# Patient Record
Sex: Female | Born: 1955
Health system: Southern US, Community
[De-identification: ages and names within clinical notes are randomized; demographics above are authoritative.]

## PROBLEM LIST (undated history)

## (undated) DIAGNOSIS — I10 Essential (primary) hypertension: Secondary | ICD-10-CM

## (undated) DIAGNOSIS — K644 Residual hemorrhoidal skin tags: Secondary | ICD-10-CM

## (undated) DIAGNOSIS — R7989 Other specified abnormal findings of blood chemistry: Secondary | ICD-10-CM

## (undated) DIAGNOSIS — R112 Nausea with vomiting, unspecified: Secondary | ICD-10-CM

## (undated) DIAGNOSIS — K921 Melena: Secondary | ICD-10-CM

## (undated) DIAGNOSIS — T8859XA Other complications of anesthesia, initial encounter: Secondary | ICD-10-CM

## (undated) DIAGNOSIS — T7840XA Allergy, unspecified, initial encounter: Secondary | ICD-10-CM

## (undated) DIAGNOSIS — R945 Abnormal results of liver function studies: Secondary | ICD-10-CM

## (undated) DIAGNOSIS — I6529 Occlusion and stenosis of unspecified carotid artery: Secondary | ICD-10-CM

## (undated) DIAGNOSIS — E785 Hyperlipidemia, unspecified: Secondary | ICD-10-CM

## (undated) DIAGNOSIS — Z9889 Other specified postprocedural states: Secondary | ICD-10-CM

## (undated) DIAGNOSIS — E119 Type 2 diabetes mellitus without complications: Secondary | ICD-10-CM

## (undated) HISTORY — DX: Hyperlipidemia, unspecified: E78.5

## (undated) HISTORY — DX: Abnormal results of liver function studies: R94.5

## (undated) HISTORY — DX: Residual hemorrhoidal skin tags: K64.4

## (undated) HISTORY — PX: POLYPECTOMY: SHX149

## (undated) HISTORY — DX: Other specified abnormal findings of blood chemistry: R79.89

## (undated) HISTORY — DX: Melena: K92.1

## (undated) HISTORY — PX: OTHER SURGICAL HISTORY: SHX169

## (undated) HISTORY — DX: Essential (primary) hypertension: I10

## (undated) HISTORY — PX: BREAST BIOPSY: SHX20

## (undated) HISTORY — DX: Occlusion and stenosis of unspecified carotid artery: I65.29

## (undated) HISTORY — PX: COLONOSCOPY: SHX174

## (undated) HISTORY — DX: Allergy, unspecified, initial encounter: T78.40XA

## (undated) HISTORY — DX: Type 2 diabetes mellitus without complications: E11.9

---

## 1998-05-27 ENCOUNTER — Ambulatory Visit (HOSPITAL_COMMUNITY): Admission: RE | Admit: 1998-05-27 | Discharge: 1998-05-27 | Payer: Self-pay | Admitting: Obstetrics & Gynecology

## 1998-05-29 ENCOUNTER — Ambulatory Visit (HOSPITAL_COMMUNITY): Admission: RE | Admit: 1998-05-29 | Discharge: 1998-05-29 | Payer: Self-pay | Admitting: Obstetrics & Gynecology

## 1998-11-26 ENCOUNTER — Ambulatory Visit (HOSPITAL_COMMUNITY): Admission: RE | Admit: 1998-11-26 | Discharge: 1998-11-26 | Payer: Self-pay | Admitting: Obstetrics & Gynecology

## 1998-11-26 ENCOUNTER — Encounter: Payer: Self-pay | Admitting: Obstetrics & Gynecology

## 1999-05-24 ENCOUNTER — Encounter: Payer: Self-pay | Admitting: Obstetrics & Gynecology

## 1999-05-24 ENCOUNTER — Ambulatory Visit (HOSPITAL_COMMUNITY): Admission: RE | Admit: 1999-05-24 | Discharge: 1999-05-24 | Payer: Self-pay | Admitting: Obstetrics & Gynecology

## 1999-08-05 ENCOUNTER — Other Ambulatory Visit: Admission: RE | Admit: 1999-08-05 | Discharge: 1999-08-05 | Payer: Self-pay | Admitting: Obstetrics & Gynecology

## 1999-09-07 ENCOUNTER — Encounter: Admission: RE | Admit: 1999-09-07 | Discharge: 1999-12-06 | Payer: Self-pay | Admitting: *Deleted

## 2000-05-26 ENCOUNTER — Encounter: Payer: Self-pay | Admitting: Obstetrics & Gynecology

## 2000-05-26 ENCOUNTER — Ambulatory Visit (HOSPITAL_COMMUNITY): Admission: RE | Admit: 2000-05-26 | Discharge: 2000-05-26 | Payer: Self-pay | Admitting: Obstetrics & Gynecology

## 2001-06-18 ENCOUNTER — Encounter: Payer: Self-pay | Admitting: Obstetrics & Gynecology

## 2001-06-18 ENCOUNTER — Ambulatory Visit (HOSPITAL_COMMUNITY): Admission: RE | Admit: 2001-06-18 | Discharge: 2001-06-18 | Payer: Self-pay | Admitting: Obstetrics & Gynecology

## 2002-06-18 ENCOUNTER — Ambulatory Visit (HOSPITAL_COMMUNITY): Admission: RE | Admit: 2002-06-18 | Discharge: 2002-06-18 | Payer: Self-pay | Admitting: Obstetrics & Gynecology

## 2002-06-18 ENCOUNTER — Other Ambulatory Visit: Admission: RE | Admit: 2002-06-18 | Discharge: 2002-06-18 | Payer: Self-pay | Admitting: Obstetrics & Gynecology

## 2002-06-18 ENCOUNTER — Encounter: Payer: Self-pay | Admitting: Obstetrics & Gynecology

## 2003-08-05 ENCOUNTER — Ambulatory Visit (HOSPITAL_COMMUNITY): Admission: RE | Admit: 2003-08-05 | Discharge: 2003-08-05 | Payer: Self-pay | Admitting: Obstetrics & Gynecology

## 2003-08-05 ENCOUNTER — Encounter: Payer: Self-pay | Admitting: Obstetrics & Gynecology

## 2003-08-21 ENCOUNTER — Other Ambulatory Visit: Admission: RE | Admit: 2003-08-21 | Discharge: 2003-08-21 | Payer: Self-pay | Admitting: Obstetrics & Gynecology

## 2004-09-27 ENCOUNTER — Ambulatory Visit (HOSPITAL_COMMUNITY): Admission: RE | Admit: 2004-09-27 | Discharge: 2004-09-27 | Payer: Self-pay | Admitting: Obstetrics & Gynecology

## 2004-09-27 ENCOUNTER — Other Ambulatory Visit: Admission: RE | Admit: 2004-09-27 | Discharge: 2004-09-27 | Payer: Self-pay | Admitting: Obstetrics & Gynecology

## 2005-12-30 ENCOUNTER — Ambulatory Visit: Payer: Self-pay | Admitting: Internal Medicine

## 2006-02-07 ENCOUNTER — Ambulatory Visit: Payer: Self-pay | Admitting: Internal Medicine

## 2006-02-14 ENCOUNTER — Encounter (INDEPENDENT_AMBULATORY_CARE_PROVIDER_SITE_OTHER): Payer: Self-pay | Admitting: Specialist

## 2006-02-14 ENCOUNTER — Ambulatory Visit: Payer: Self-pay | Admitting: Internal Medicine

## 2006-02-22 ENCOUNTER — Ambulatory Visit (HOSPITAL_COMMUNITY): Admission: RE | Admit: 2006-02-22 | Discharge: 2006-02-22 | Payer: Self-pay | Admitting: Internal Medicine

## 2007-03-28 ENCOUNTER — Ambulatory Visit (HOSPITAL_COMMUNITY): Admission: RE | Admit: 2007-03-28 | Discharge: 2007-03-28 | Payer: Self-pay | Admitting: Internal Medicine

## 2008-03-27 ENCOUNTER — Ambulatory Visit (HOSPITAL_COMMUNITY): Admission: RE | Admit: 2008-03-27 | Discharge: 2008-03-27 | Payer: Self-pay | Admitting: Internal Medicine

## 2009-04-08 ENCOUNTER — Ambulatory Visit (HOSPITAL_COMMUNITY): Admission: RE | Admit: 2009-04-08 | Discharge: 2009-04-08 | Payer: Self-pay | Admitting: Internal Medicine

## 2009-06-08 DIAGNOSIS — E1142 Type 2 diabetes mellitus with diabetic polyneuropathy: Secondary | ICD-10-CM | POA: Insufficient documentation

## 2009-06-08 DIAGNOSIS — E785 Hyperlipidemia, unspecified: Secondary | ICD-10-CM | POA: Insufficient documentation

## 2009-06-08 DIAGNOSIS — I10 Essential (primary) hypertension: Secondary | ICD-10-CM

## 2009-06-08 HISTORY — DX: Type 2 diabetes mellitus with diabetic polyneuropathy: E11.42

## 2009-08-13 DIAGNOSIS — I6529 Occlusion and stenosis of unspecified carotid artery: Secondary | ICD-10-CM | POA: Insufficient documentation

## 2009-08-13 HISTORY — DX: Occlusion and stenosis of unspecified carotid artery: I65.29

## 2010-04-12 ENCOUNTER — Ambulatory Visit (HOSPITAL_COMMUNITY): Admission: RE | Admit: 2010-04-12 | Discharge: 2010-04-12 | Payer: Self-pay | Admitting: Internal Medicine

## 2011-01-12 ENCOUNTER — Telehealth: Payer: Self-pay | Admitting: Internal Medicine

## 2011-03-08 ENCOUNTER — Ambulatory Visit: Payer: Self-pay | Admitting: Internal Medicine

## 2011-03-25 ENCOUNTER — Other Ambulatory Visit (HOSPITAL_COMMUNITY): Payer: Self-pay | Admitting: Internal Medicine

## 2011-03-25 DIAGNOSIS — Z1231 Encounter for screening mammogram for malignant neoplasm of breast: Secondary | ICD-10-CM

## 2011-04-18 ENCOUNTER — Ambulatory Visit (HOSPITAL_COMMUNITY)
Admission: RE | Admit: 2011-04-18 | Discharge: 2011-04-18 | Disposition: A | Discharge status: Home / Self Care | Payer: BC Managed Care – PPO | Source: Ambulatory Visit | Attending: Internal Medicine | Admitting: Internal Medicine

## 2011-04-18 DIAGNOSIS — Z1231 Encounter for screening mammogram for malignant neoplasm of breast: Secondary | ICD-10-CM

## 2011-04-25 NOTE — Telephone Encounter (Signed)
Summary: Triage  Phone Note From Other Clinic   Caller: [facility] @ [facility] [phone] Call For: Dr. Henrene Pastor Summary of Call: [place] hx w/Dr. Henrene Pastor.Marland KitchenMarland KitchenMarland KitchenDiarrhea and problems w/hemorroids. Requesting appt. w/P.A. Initial call taken by: Webb Laws,  January 12, 2011 3:41 PM  Follow-up for Phone Call        Spoke with Peter Congo at [facility] and requested records be faxed over for Dr. Henrene Pastor to review. No history with Henrene Pastor found. Follow-up by: Rosanne Sack RN,  January 13, 2011 10:30 AM

## 2012-04-02 ENCOUNTER — Other Ambulatory Visit (HOSPITAL_COMMUNITY): Payer: Self-pay | Admitting: Internal Medicine

## 2012-04-02 DIAGNOSIS — Z1231 Encounter for screening mammogram for malignant neoplasm of breast: Secondary | ICD-10-CM

## 2012-04-19 DIAGNOSIS — E669 Obesity, unspecified: Secondary | ICD-10-CM | POA: Insufficient documentation

## 2012-04-19 HISTORY — DX: Obesity, unspecified: E66.9

## 2012-04-23 ENCOUNTER — Ambulatory Visit (HOSPITAL_COMMUNITY): Payer: BC Managed Care – PPO

## 2012-05-16 ENCOUNTER — Ambulatory Visit (HOSPITAL_COMMUNITY)
Admission: RE | Admit: 2012-05-16 | Discharge: 2012-05-16 | Disposition: A | Discharge status: Home / Self Care | Payer: BC Managed Care – PPO | Source: Ambulatory Visit | Attending: Internal Medicine | Admitting: Internal Medicine

## 2012-05-16 DIAGNOSIS — Z1231 Encounter for screening mammogram for malignant neoplasm of breast: Secondary | ICD-10-CM | POA: Insufficient documentation

## 2012-06-06 ENCOUNTER — Other Ambulatory Visit: Payer: Self-pay | Admitting: Internal Medicine

## 2012-06-06 DIAGNOSIS — R1011 Right upper quadrant pain: Secondary | ICD-10-CM

## 2012-06-07 ENCOUNTER — Ambulatory Visit
Admission: RE | Admit: 2012-06-07 | Discharge: 2012-06-07 | Disposition: A | Discharge status: Home / Self Care | Payer: BC Managed Care – PPO | Source: Ambulatory Visit | Attending: Internal Medicine | Admitting: Internal Medicine

## 2012-06-07 DIAGNOSIS — R1011 Right upper quadrant pain: Secondary | ICD-10-CM

## 2012-06-08 ENCOUNTER — Other Ambulatory Visit: Payer: Self-pay | Admitting: Internal Medicine

## 2012-06-08 DIAGNOSIS — K769 Liver disease, unspecified: Secondary | ICD-10-CM

## 2012-06-13 ENCOUNTER — Ambulatory Visit
Admission: RE | Admit: 2012-06-13 | Discharge: 2012-06-13 | Disposition: A | Discharge status: Home / Self Care | Payer: BC Managed Care – PPO | Source: Ambulatory Visit | Attending: Internal Medicine | Admitting: Internal Medicine

## 2012-06-13 DIAGNOSIS — K769 Liver disease, unspecified: Secondary | ICD-10-CM

## 2012-06-13 MED ORDER — GADOBENATE DIMEGLUMINE 529 MG/ML IV SOLN
18.0000 mL | Freq: Once | INTRAVENOUS | Status: AC | PRN
  Administered 2012-06-13: 18 mL via INTRAVENOUS

## 2012-06-14 ENCOUNTER — Other Ambulatory Visit (HOSPITAL_COMMUNITY): Payer: Self-pay | Admitting: Internal Medicine

## 2012-06-14 DIAGNOSIS — R160 Hepatomegaly, not elsewhere classified: Secondary | ICD-10-CM

## 2012-06-15 ENCOUNTER — Other Ambulatory Visit: Payer: Self-pay | Admitting: Radiology

## 2012-06-18 ENCOUNTER — Encounter (HOSPITAL_COMMUNITY): Payer: Self-pay | Admitting: Pharmacy Technician

## 2012-06-19 ENCOUNTER — Encounter (HOSPITAL_COMMUNITY): Payer: Self-pay

## 2012-06-19 ENCOUNTER — Ambulatory Visit (HOSPITAL_COMMUNITY)
Admission: RE | Admit: 2012-06-19 | Discharge: 2012-06-19 | Disposition: A | Discharge status: Home / Self Care | Payer: BC Managed Care – PPO | Source: Ambulatory Visit | Attending: Internal Medicine | Admitting: Internal Medicine

## 2012-06-19 DIAGNOSIS — K7689 Other specified diseases of liver: Secondary | ICD-10-CM | POA: Insufficient documentation

## 2012-06-19 DIAGNOSIS — R7989 Other specified abnormal findings of blood chemistry: Secondary | ICD-10-CM | POA: Insufficient documentation

## 2012-06-19 DIAGNOSIS — R160 Hepatomegaly, not elsewhere classified: Secondary | ICD-10-CM

## 2012-06-19 DIAGNOSIS — I10 Essential (primary) hypertension: Secondary | ICD-10-CM | POA: Insufficient documentation

## 2012-06-19 LAB — GLUCOSE, CAPILLARY
Glucose-Capillary: 196 mg/dL — ABNORMAL HIGH (ref 70–99)
Glucose-Capillary: 164 mg/dL — ABNORMAL HIGH (ref 70–99)

## 2012-06-19 LAB — APTT: aPTT: 34 seconds (ref 24–37)

## 2012-06-19 LAB — CBC
MCV: 78.1 fL (ref 78.0–100.0)
Platelets: 235 10*3/uL (ref 150–400)
RBC: 5.25 MIL/uL — ABNORMAL HIGH (ref 3.87–5.11)
WBC: 13 10*3/uL — ABNORMAL HIGH (ref 4.0–10.5)

## 2012-06-19 LAB — PROTIME-INR
INR: 0.91 (ref 0.00–1.49)
Prothrombin Time: 12.5 seconds (ref 11.6–15.2)

## 2012-06-19 MED ORDER — SODIUM CHLORIDE 0.9 % IV SOLN
Freq: Once | INTRAVENOUS | Status: AC
  Administered 2012-06-19: 1000 mL via INTRAVENOUS

## 2012-06-19 MED ORDER — MIDAZOLAM HCL 5 MG/5ML IJ SOLN
INTRAMUSCULAR | Status: AC | PRN
  Administered 2012-06-19: 2 mg via INTRAVENOUS

## 2012-06-19 MED ORDER — HYDROCODONE-ACETAMINOPHEN 5-325 MG PO TABS: 1.0000 | ORAL_TABLET | ORAL | Status: DC | PRN

## 2012-06-19 MED ORDER — FENTANYL CITRATE 0.05 MG/ML IJ SOLN
INTRAMUSCULAR | Status: AC
  Filled 2012-06-19: qty 4

## 2012-06-19 MED ORDER — MIDAZOLAM HCL 2 MG/2ML IJ SOLN
INTRAMUSCULAR | Status: AC
  Filled 2012-06-19: qty 6

## 2012-06-19 MED ORDER — FENTANYL CITRATE 0.05 MG/ML IJ SOLN
INTRAMUSCULAR | Status: AC | PRN
  Administered 2012-06-19 (×2): 50 ug via INTRAVENOUS

## 2012-06-19 NOTE — Procedures (Signed)
US guided core biopsies of right hepatic lesion.  4 cores samples obtained.  No immediate complication.

## 2012-06-19 NOTE — ED Notes (Signed)
John from transportation here to take pt to [facility]

## 2012-06-19 NOTE — H&P (Signed)
Chief Complaint: "I'm here for liver biopsy" Referring Physician:Shaw HPI: Valerie Cline is an 56 y.o. female who had some RUQ discomfort concerning for gallbladder disease but incidentally found a liver lesion concerning for malignancy. She's had no prior history of cancer and no prior imaging documenting this lesion. She is referred for US guided biopsy of this lesion. PMHx reviewed, she otherwise feels ok.  Past Medical History:  Past Medical History  Diagnosis Date  . External hemorrhoids without mention of complication   . Blood in stool   . Hyperlipidemia   . Hypertension   . Carotid stenosis   . Elevated liver function tests     Past Surgical History: Colonoscopy, polypectomy---benign path  Family History: No family history on file.  Social History:  reports that she has never smoked. She does not have any smokeless tobacco history on file. She reports that she does not drink alcohol or use illicit drugs.  Allergies: No Known Allergies  Medications: amLODipine (NORVASC) 10 MG tablet (Taking) Sig - Route: Take 10 mg by mouth daily. - Oral Class: Historical Med Number of times this order has been changed since signing: 1 Order [address] aspirin 81 MG tablet (Taking) Sig - Route: Take 81 mg by mouth daily. - Oral Class: Historical Med Number of times this order has been changed since signing: 1 Order [address] atorvastatin (LIPITOR) 40 MG tablet (Taking) 05/24/2012 Sig - Route: Take 40 mg by mouth Daily. - Oral Class: Historical Med Number of times this order has been changed since signing: 1 [address] benazepril (LOTENSIN) 20 MG tablet (Taking) Sig - Route: Take 20 mg by mouth daily. - Oral Class: Historical Med Number of times this order has been changed since signing: 1 Order [address] fenofibrate 160 MG tablet (Taking) Sig - Route: Take 160 mg by mouth daily. - Oral Class: Historical Med Number of times this order has been changed since signing: 1 Order [address]  hydrochlorothiazide 25 MG tablet (Taking) Sig - Route: Take 25 mg by mouth daily. - Oral Class: Historical Med Number of times this order has been changed since signing: 1 Order [address] insulin regular human CONCENTRATED (HUMULIN R) 500 UNIT/ML SOLN injection (Taking) Sig - Route: Inject into the skin as directed. - Subcutaneous Class: Historical Med Number of times this order has been changed since signing: 1 Order [address] metFORMIN (GLUCOPHAGE) 1000 MG tablet (Taking) Sig - Route: Take 1,000 mg by mouth 2 (two) times daily with a meal. - Oral Class: Historical Med Number of times this order has been changed since signing: 1 Order [address] metoprolol succinate (TOPROL-XL) 25 MG 24 hr tablet (Taking) 05/23/2012 Sig - Route: Take 25 mg by mouth Daily. - Oral   Please HPI for pertinent positives, otherwise complete 10 system ROS negative.  Physical Exam: Blood pressure 177/81, pulse 86, temperature 98.9 F (37.2 C), temperature source Oral, resp. rate 18, height 5\' 7"  (1.702 m), weight 195 lb (88.451 kg), SpO2 93.00%. Body mass index is 30.54 kg/(m^2).   General Appearance:  Alert, cooperative, no distress, appears stated age  Head:  Normocephalic, without obvious abnormality, atraumatic  ENT: Unremarkable  Neck: Supple, symmetrical, trachea midline, no adenopathy, thyroid: not enlarged, symmetric, no tenderness/mass/nodules  Lungs:   Clear to auscultation bilaterally, no w/r/r, respirations unlabored without use of accessory muscles.  Heart:  Regular rate and rhythm, S1, S2 normal, no murmur, rub or gallop. Carotids 2+ without bruit.  Abdomen:   Soft, non-tender,  non distended. Bowel sounds active all four quadrants,  no masses, no organomegaly.  Neurologic: Normal affect, no gross deficits.   Results for orders placed during the hospital encounter of 06/19/12 (from the past 48 hour(s))  GLUCOSE, CAPILLARY     Status: Abnormal   Collection Time   06/19/12 12:46 PM      Component Value  Range Comment   Glucose-Capillary 196 (*) 70 - 99 mg/dL    No results found.  Assessment/PlanRight lobe Right lobe liver lesion. For US guide liver lesion biopsy today Discussed procedure with pt and spouse including risks and complications. Labs pending Consent signed in chart  Ascencion Dike PA-C 06/19/2012, 1:08 PM

## 2012-06-19 NOTE — ED Notes (Signed)
O2 2L [place] 

## 2012-06-20 ENCOUNTER — Telehealth (HOSPITAL_COMMUNITY): Payer: Self-pay | Admitting: *Deleted

## 2012-09-06 ENCOUNTER — Other Ambulatory Visit: Payer: Self-pay | Admitting: Internal Medicine

## 2012-09-06 DIAGNOSIS — K760 Fatty (change of) liver, not elsewhere classified: Secondary | ICD-10-CM

## 2012-09-06 DIAGNOSIS — R160 Hepatomegaly, not elsewhere classified: Secondary | ICD-10-CM

## 2012-09-06 HISTORY — DX: Fatty (change of) liver, not elsewhere classified: K76.0

## 2012-09-11 ENCOUNTER — Ambulatory Visit
Admission: RE | Admit: 2012-09-11 | Discharge: 2012-09-11 | Disposition: A | Discharge status: Home / Self Care | Payer: BC Managed Care – PPO | Source: Ambulatory Visit | Attending: Internal Medicine | Admitting: Internal Medicine

## 2012-09-11 DIAGNOSIS — R160 Hepatomegaly, not elsewhere classified: Secondary | ICD-10-CM

## 2013-05-07 ENCOUNTER — Other Ambulatory Visit (HOSPITAL_COMMUNITY): Payer: Self-pay | Admitting: Internal Medicine

## 2013-05-07 DIAGNOSIS — Z1231 Encounter for screening mammogram for malignant neoplasm of breast: Secondary | ICD-10-CM

## 2013-05-20 ENCOUNTER — Ambulatory Visit (HOSPITAL_COMMUNITY)
Admission: RE | Admit: 2013-05-20 | Discharge: 2013-05-20 | Disposition: A | Discharge status: Home / Self Care | Payer: BC Managed Care – PPO | Source: Ambulatory Visit | Attending: Internal Medicine | Admitting: Internal Medicine

## 2013-05-20 DIAGNOSIS — Z1231 Encounter for screening mammogram for malignant neoplasm of breast: Secondary | ICD-10-CM | POA: Insufficient documentation

## 2014-04-15 ENCOUNTER — Other Ambulatory Visit (HOSPITAL_COMMUNITY): Payer: Self-pay | Admitting: Internal Medicine

## 2014-04-15 DIAGNOSIS — Z1231 Encounter for screening mammogram for malignant neoplasm of breast: Secondary | ICD-10-CM

## 2014-05-22 ENCOUNTER — Ambulatory Visit (HOSPITAL_COMMUNITY)
Admission: RE | Admit: 2014-05-22 | Discharge: 2014-05-22 | Disposition: A | Discharge status: Home / Self Care | Payer: BC Managed Care – PPO | Source: Ambulatory Visit | Attending: Internal Medicine | Admitting: Internal Medicine

## 2014-05-22 DIAGNOSIS — Z1231 Encounter for screening mammogram for malignant neoplasm of breast: Secondary | ICD-10-CM | POA: Insufficient documentation

## 2015-06-04 ENCOUNTER — Other Ambulatory Visit (HOSPITAL_COMMUNITY): Payer: Self-pay | Admitting: Internal Medicine

## 2015-06-04 DIAGNOSIS — Z1231 Encounter for screening mammogram for malignant neoplasm of breast: Secondary | ICD-10-CM

## 2015-06-12 ENCOUNTER — Ambulatory Visit (HOSPITAL_COMMUNITY)
Admission: RE | Admit: 2015-06-12 | Discharge: 2015-06-12 | Disposition: A | Discharge status: Home / Self Care | Payer: BLUE CROSS/BLUE SHIELD | Source: Ambulatory Visit | Attending: Internal Medicine | Admitting: Internal Medicine

## 2015-06-12 DIAGNOSIS — Z1231 Encounter for screening mammogram for malignant neoplasm of breast: Secondary | ICD-10-CM | POA: Insufficient documentation

## 2016-02-10 DIAGNOSIS — E1165 Type 2 diabetes mellitus with hyperglycemia: Secondary | ICD-10-CM | POA: Diagnosis not present

## 2016-02-10 DIAGNOSIS — Z6830 Body mass index (BMI) 30.0-30.9, adult: Secondary | ICD-10-CM | POA: Diagnosis not present

## 2016-02-10 DIAGNOSIS — I10 Essential (primary) hypertension: Secondary | ICD-10-CM | POA: Diagnosis not present

## 2016-03-31 DIAGNOSIS — J069 Acute upper respiratory infection, unspecified: Secondary | ICD-10-CM | POA: Diagnosis not present

## 2016-04-04 DIAGNOSIS — J0100 Acute maxillary sinusitis, unspecified: Secondary | ICD-10-CM | POA: Diagnosis not present

## 2016-04-04 DIAGNOSIS — J209 Acute bronchitis, unspecified: Secondary | ICD-10-CM | POA: Diagnosis not present

## 2016-04-26 DIAGNOSIS — I10 Essential (primary) hypertension: Secondary | ICD-10-CM | POA: Diagnosis not present

## 2016-04-26 DIAGNOSIS — E784 Other hyperlipidemia: Secondary | ICD-10-CM | POA: Diagnosis not present

## 2016-04-26 DIAGNOSIS — E668 Other obesity: Secondary | ICD-10-CM | POA: Diagnosis not present

## 2016-04-26 DIAGNOSIS — E1165 Type 2 diabetes mellitus with hyperglycemia: Secondary | ICD-10-CM | POA: Diagnosis not present

## 2016-05-13 ENCOUNTER — Other Ambulatory Visit: Payer: Self-pay | Admitting: Internal Medicine

## 2016-05-13 DIAGNOSIS — Z1231 Encounter for screening mammogram for malignant neoplasm of breast: Secondary | ICD-10-CM

## 2016-06-07 DIAGNOSIS — E1165 Type 2 diabetes mellitus with hyperglycemia: Secondary | ICD-10-CM | POA: Diagnosis not present

## 2016-06-07 DIAGNOSIS — Z6830 Body mass index (BMI) 30.0-30.9, adult: Secondary | ICD-10-CM | POA: Diagnosis not present

## 2016-06-07 DIAGNOSIS — I10 Essential (primary) hypertension: Secondary | ICD-10-CM | POA: Diagnosis not present

## 2016-06-13 ENCOUNTER — Ambulatory Visit
Admission: RE | Admit: 2016-06-13 | Discharge: 2016-06-13 | Disposition: A | Discharge status: Home / Self Care | Payer: BLUE CROSS/BLUE SHIELD | Source: Ambulatory Visit | Attending: Internal Medicine | Admitting: Internal Medicine

## 2016-06-13 DIAGNOSIS — Z1231 Encounter for screening mammogram for malignant neoplasm of breast: Secondary | ICD-10-CM | POA: Diagnosis not present

## 2016-08-18 ENCOUNTER — Encounter: Payer: BLUE CROSS/BLUE SHIELD | Attending: Internal Medicine | Admitting: *Deleted

## 2016-08-18 DIAGNOSIS — E782 Mixed hyperlipidemia: Secondary | ICD-10-CM | POA: Insufficient documentation

## 2016-08-18 DIAGNOSIS — I10 Essential (primary) hypertension: Secondary | ICD-10-CM | POA: Diagnosis not present

## 2016-08-18 DIAGNOSIS — Z713 Dietary counseling and surveillance: Secondary | ICD-10-CM | POA: Diagnosis not present

## 2016-08-18 DIAGNOSIS — E669 Obesity, unspecified: Secondary | ICD-10-CM | POA: Diagnosis not present

## 2016-08-18 DIAGNOSIS — E119 Type 2 diabetes mellitus without complications: Secondary | ICD-10-CM | POA: Diagnosis not present

## 2016-08-18 NOTE — Patient Instructions (Signed)
Plan:  Aim for 2-3 Carb Choices per meal (30-45 grams)  Aim for 0-2 Carbs per snack if hungry  Include protein in moderation with your meals and snacks Consider reading food labels for Total Carbohydrate of foods Consider  increasing your activity level daily as tolerated Continue checking BG at alternate times per day   Continue taking medication as directed by MD

## 2016-08-18 NOTE — Progress Notes (Signed)
Diabetes Self-Management Education  Visit Type: First/Initial  Appt. Start Time: 0730 Appt. End Time: 0900  08/18/2016  [name], identified by name and date of birth, is a 60 y.o. female with a diagnosis of Diabetes: Type 2.   ASSESSMENT  Height 5\' 7"  (1.702 m). There is no height or weight on file to calculate BMI.      Diabetes Self-Management Education - 08/18/16 0740      Visit Information   Visit Type First/Initial     Initial Visit   Diabetes Type Type 2   Are you currently following a meal plan? No   Are you taking your medications as prescribed? Yes   Date Diagnosed 2000     Health Coping   How would you rate your overall health? Good     Psychosocial Assessment   Patient Belief/Attitude about Diabetes Motivated to manage diabetes  and burned out   Self-care barriers None   Self-management support Family   Other persons present Patient;Spouse/SO   Patient Concerns Nutrition/Meal planning;Weight Control;Glycemic Control   Special Needs None   What is the last grade level you completed in school? 2 years college     Complications   Last HgB A1C per patient/outside source 7.9 %   How often do you check your blood sugar? 3-4 times/day   Fasting Blood glucose range (mg/dL) >200   Postprandial Blood glucose range (mg/dL) >200   Number of hypoglycemic episodes per month 0   Have you had a dilated eye exam in the past 12 months? Yes   Have you had a dental exam in the past 12 months? Yes   Are you checking your feet? Yes   How many days per week are you checking your feet? 1     Dietary Intake   Breakfast bacon and eggs OR fruit and eggs OR oatmeal and eggs OR PNB on toast   Snack (morning) 5-6 PNB or cheese and crackers    Lunch left overs OR sandwich OR eats out for sandwich or vegetable plate   Snack (afternoon) occasionally popcorn or 1 handful chips   Dinner meat, starch (potato), vegetables    Snack (evening) occasionally if BG is too low  (under 100)   Beverage(s) coffee with creamer, water, unsweet tea     Exercise   Exercise Type Light (walking / raking leaves)   How many days per week to you exercise? 2   How many minutes per day do you exercise? 15   Total minutes per week of exercise 30     Patient Education   Previous Diabetes Education Yes (please comment)   Disease state  Definition of diabetes, type 1 and 2, and the diagnosis of diabetes   Nutrition management  Role of diet in the treatment of diabetes and the relationship between the three main macronutrients and blood glucose level;Food label reading, portion sizes and measuring food.;Carbohydrate counting   Physical activity and exercise  Role of exercise on diabetes management, blood pressure control and cardiac health.   Medications Reviewed patients medication for diabetes, action, purpose, timing of dose and side effects.   Monitoring Identified appropriate SMBG and/or A1C goals.   Acute complications Taught treatment of hypoglycemia - the 15 rule.   Chronic complications Relationship between chronic complications and blood glucose control   Psychosocial adjustment Role of stress on diabetes     Individualized Goals (developed by patient)   Nutrition Follow meal plan discussed   Physical Activity Exercise  3-5 times per week   Medications take my medication as prescribed   Monitoring  test blood glucose pre and post meals as discussed   Reducing Risk examine blood glucose patterns     Post-Education Assessment   Patient understands the diabetes disease and treatment process. Demonstrates understanding / competency   Patient understands incorporating nutritional management into lifestyle. Demonstrates understanding / competency   Patient undertands incorporating physical activity into lifestyle. Needs Review   Patient understands using medications safely. Demonstrates understanding / competency   Patient understands monitoring blood glucose, interpreting  and using results Demonstrates understanding / competency   Patient understands prevention, detection, and treatment of acute complications. Demonstrates understanding / competency   Patient understands prevention, detection, and treatment of chronic complications. Demonstrates understanding / competency     Outcomes   Expected Outcomes Demonstrated interest in learning. Expect positive outcomes   Future DMSE 3-4 months   Program Status Completed      Individualized Plan for Diabetes Self-Management Training:   Learning Objective:  Patient will have a greater understanding of diabetes self-management. Patient education plan is to attend individual and/or group sessions per assessed needs and concerns.   Plan:  Also presented the idea of insulin pump therapy due to her forgetting to take insulin when she is eating out, the frustration of not having a way to correct high blood sugars at this time and that moving to pump therapy often reduces total amount of insulin needed when exclusively using analog insulin. I did suggest she ask for C-peptide to determine insulin production IF she wants to pursue a pump due to Medicare stipulations and her current age.   Patient Instructions  Plan:  Aim for 2-3 Carb Choices per meal (30-45 grams)  Aim for 0-2 Carbs per snack if hungry  Include protein in moderation with your meals and snacks Consider reading food labels for Total Carbohydrate of foods Consider  increasing your activity level daily as tolerated Continue checking BG at alternate times per day   Continue taking medication as directed by MD      Expected Outcomes:  Demonstrated interest in learning. Expect positive outcomes  Education material provided: Living Well with Diabetes handout, A1C conversion sheet, Meal plan card and Carbohydrate counting sheet  If problems or questions, patient to contact team via:  Phone and Email  Future DSME appointment: 3-4 months

## 2016-08-25 DIAGNOSIS — Z23 Encounter for immunization: Secondary | ICD-10-CM | POA: Diagnosis not present

## 2016-08-25 DIAGNOSIS — K760 Fatty (change of) liver, not elsewhere classified: Secondary | ICD-10-CM | POA: Diagnosis not present

## 2016-08-25 DIAGNOSIS — E784 Other hyperlipidemia: Secondary | ICD-10-CM | POA: Diagnosis not present

## 2016-08-25 DIAGNOSIS — I10 Essential (primary) hypertension: Secondary | ICD-10-CM | POA: Diagnosis not present

## 2016-08-25 DIAGNOSIS — E1165 Type 2 diabetes mellitus with hyperglycemia: Secondary | ICD-10-CM | POA: Diagnosis not present

## 2016-09-07 DIAGNOSIS — H5203 Hypermetropia, bilateral: Secondary | ICD-10-CM | POA: Diagnosis not present

## 2016-10-06 DIAGNOSIS — I10 Essential (primary) hypertension: Secondary | ICD-10-CM | POA: Diagnosis not present

## 2016-10-06 DIAGNOSIS — E1165 Type 2 diabetes mellitus with hyperglycemia: Secondary | ICD-10-CM | POA: Diagnosis not present

## 2016-10-06 DIAGNOSIS — Z6830 Body mass index (BMI) 30.0-30.9, adult: Secondary | ICD-10-CM | POA: Diagnosis not present

## 2016-10-25 DIAGNOSIS — N3001 Acute cystitis with hematuria: Secondary | ICD-10-CM | POA: Diagnosis not present

## 2016-10-25 DIAGNOSIS — R300 Dysuria: Secondary | ICD-10-CM | POA: Diagnosis not present

## 2016-12-19 DIAGNOSIS — Z0000 Encounter for general adult medical examination without abnormal findings: Secondary | ICD-10-CM | POA: Diagnosis not present

## 2016-12-20 ENCOUNTER — Ambulatory Visit: Payer: BLUE CROSS/BLUE SHIELD | Admitting: *Deleted

## 2016-12-20 DIAGNOSIS — E1165 Type 2 diabetes mellitus with hyperglycemia: Secondary | ICD-10-CM | POA: Diagnosis not present

## 2016-12-20 DIAGNOSIS — I10 Essential (primary) hypertension: Secondary | ICD-10-CM | POA: Diagnosis not present

## 2016-12-27 DIAGNOSIS — E1165 Type 2 diabetes mellitus with hyperglycemia: Secondary | ICD-10-CM | POA: Diagnosis not present

## 2016-12-27 DIAGNOSIS — Z0000 Encounter for general adult medical examination without abnormal findings: Secondary | ICD-10-CM | POA: Diagnosis not present

## 2016-12-27 DIAGNOSIS — I10 Essential (primary) hypertension: Secondary | ICD-10-CM | POA: Diagnosis not present

## 2016-12-27 DIAGNOSIS — E781 Pure hyperglyceridemia: Secondary | ICD-10-CM | POA: Diagnosis not present

## 2016-12-27 DIAGNOSIS — Z1389 Encounter for screening for other disorder: Secondary | ICD-10-CM | POA: Diagnosis not present

## 2016-12-27 DIAGNOSIS — E784 Other hyperlipidemia: Secondary | ICD-10-CM | POA: Diagnosis not present

## 2016-12-28 DIAGNOSIS — Z1212 Encounter for screening for malignant neoplasm of rectum: Secondary | ICD-10-CM | POA: Diagnosis not present

## 2017-01-30 ENCOUNTER — Encounter: Payer: Self-pay | Admitting: Internal Medicine

## 2017-02-07 DIAGNOSIS — E1165 Type 2 diabetes mellitus with hyperglycemia: Secondary | ICD-10-CM | POA: Diagnosis not present

## 2017-02-07 DIAGNOSIS — K760 Fatty (change of) liver, not elsewhere classified: Secondary | ICD-10-CM | POA: Diagnosis not present

## 2017-02-07 DIAGNOSIS — I10 Essential (primary) hypertension: Secondary | ICD-10-CM | POA: Diagnosis not present

## 2017-02-07 DIAGNOSIS — Z6829 Body mass index (BMI) 29.0-29.9, adult: Secondary | ICD-10-CM | POA: Diagnosis not present

## 2017-03-30 ENCOUNTER — Ambulatory Visit (AMBULATORY_SURGERY_CENTER): Payer: Self-pay

## 2017-03-30 VITALS — Ht 67.0 in | Wt 189.2 lb

## 2017-03-30 DIAGNOSIS — Z86010 Personal history of colonic polyps: Secondary | ICD-10-CM

## 2017-03-30 MED ORDER — NA SULFATE-K SULFATE-MG SULF 17.5-3.13-1.6 GM/177ML PO SOLN: 1.0000 | Freq: Once | ORAL | 0 refills | Status: AC

## 2017-03-30 NOTE — Progress Notes (Signed)
Denies allergies to eggs or soy products. Denies complication of anesthesia or sedation. Denies use of weight loss medication. Denies use of O2.   Emmi instructions given for colonoscopy.  

## 2017-04-03 ENCOUNTER — Encounter: Payer: Self-pay | Admitting: Internal Medicine

## 2017-04-06 DIAGNOSIS — E1165 Type 2 diabetes mellitus with hyperglycemia: Secondary | ICD-10-CM | POA: Diagnosis not present

## 2017-04-06 DIAGNOSIS — Z5181 Encounter for therapeutic drug level monitoring: Secondary | ICD-10-CM | POA: Diagnosis not present

## 2017-04-06 DIAGNOSIS — Z794 Long term (current) use of insulin: Secondary | ICD-10-CM | POA: Diagnosis not present

## 2017-04-06 DIAGNOSIS — I10 Essential (primary) hypertension: Secondary | ICD-10-CM | POA: Diagnosis not present

## 2017-04-06 DIAGNOSIS — Z79899 Other long term (current) drug therapy: Secondary | ICD-10-CM | POA: Diagnosis not present

## 2017-04-06 DIAGNOSIS — K521 Toxic gastroenteritis and colitis: Secondary | ICD-10-CM | POA: Diagnosis not present

## 2017-04-12 ENCOUNTER — Ambulatory Visit (AMBULATORY_SURGERY_CENTER): Payer: BLUE CROSS/BLUE SHIELD | Admitting: Internal Medicine

## 2017-04-12 ENCOUNTER — Encounter: Payer: Self-pay | Admitting: Internal Medicine

## 2017-04-12 VITALS — BP 114/52 | HR 76 | Temp 98.4°F | Resp 14 | Ht 67.0 in | Wt 189.0 lb

## 2017-04-12 DIAGNOSIS — Z1211 Encounter for screening for malignant neoplasm of colon: Secondary | ICD-10-CM | POA: Diagnosis not present

## 2017-04-12 DIAGNOSIS — Z1212 Encounter for screening for malignant neoplasm of rectum: Secondary | ICD-10-CM

## 2017-04-12 DIAGNOSIS — Z86010 Personal history of colonic polyps: Secondary | ICD-10-CM | POA: Diagnosis not present

## 2017-04-12 DIAGNOSIS — D123 Benign neoplasm of transverse colon: Secondary | ICD-10-CM | POA: Diagnosis not present

## 2017-04-12 MED ORDER — SODIUM CHLORIDE 0.9 % IV SOLN: 500.0000 mL | INTRAVENOUS | Status: DC

## 2017-04-12 NOTE — Progress Notes (Signed)
Pt's states no medical or surgical changes since previsit or office visit. maw 

## 2017-04-12 NOTE — Progress Notes (Signed)
Called to room to assist during endoscopic procedure.  Patient ID and intended procedure confirmed with present staff. Received instructions for my participation in the procedure from the performing physician.  

## 2017-04-12 NOTE — Op Note (Signed)
[place] Patient Name: Toba Claudio Procedure Date: 04/12/2017 8:52 AM MRN: [id number] Endoscopist: Docia Chuck. Henrene Pastor , MD Age: 61 Referring MD:  Date of Birth: May 28, 1956 Gender: Female Account #: [id number] Procedure:                Colonoscopy, with cold snare polypectomy x 1 Indications:              Screening for colorectal malignant neoplasm.                            Negative index exam 2007 Medicines:                Monitored Anesthesia Care Procedure:                Pre-Anesthesia Assessment:                           - Prior to the procedure, a History and Physical                            was performed, and patient medications and                            allergies were reviewed. The patient's tolerance of                            previous anesthesia was also reviewed. The risks                            and benefits of the procedure and the sedation                            options and risks were discussed with the patient.                            All questions were answered, and informed consent                            was obtained. Prior Anticoagulants: The patient has                            taken no previous anticoagulant or antiplatelet                            agents. ASA Grade Assessment: II - A patient with                            mild systemic disease. After reviewing the risks                            and benefits, the patient was deemed in                            satisfactory condition to undergo the procedure.  After obtaining informed consent, the colonoscope                            was passed under direct vision. Throughout the                            procedure, the patient's blood pressure, pulse, and                            oxygen saturations were monitored continuously. The                            Colonoscope was introduced through the anus and                            advanced to  the the cecum, identified by                            appendiceal orifice and ileocecal valve. The                            ileocecal valve, appendiceal orifice, and rectum                            were photographed. The quality of the bowel                            preparation was excellent. The colonoscopy was                            performed without difficulty. The patient tolerated                            the procedure well. The bowel preparation used was                            SUPREP. Scope In: 9:17:12 AM Scope Out: 9:31:26 AM Scope Withdrawal Time: 0 hours 10 minutes 59 seconds  Total Procedure Duration: 0 hours 14 minutes 14 seconds  Findings:                 A 1 mm polyp was found in the transverse colon. The                            polyp was removed with a cold snare. Resection and                            retrieval were complete.                           Internal hemorrhoids were found during retroflexion.                           The exam was otherwise without abnormality on  direct and retroflexion views. Complications:            No immediate complications. Estimated blood loss:                            None. Estimated Blood Loss:     Estimated blood loss: none. Impression:               - One 1 mm polyp in the transverse colon, removed                            with a cold snare. Resected and retrieved.                           - Internal hemorrhoids.                           - The examination was otherwise normal on direct                            and retroflexion views. Recommendation:           - Repeat colonoscopy in 5-10 years for surveillance.                           - Patient has a contact number available for                            emergencies. The signs and symptoms of potential                            delayed complications were discussed with the                            patient. Return to  normal activities tomorrow.                            Written discharge instructions were provided to the                            patient.                           - Resume previous diet.                           - Continue present medications.                           - Await pathology results. Docia Chuck. Henrene Pastor, MD 04/12/2017 9:35:06 AM This report has been signed electronically.

## 2017-04-12 NOTE — Progress Notes (Signed)
Report to PACU, RN, vss, BBS= Clear.  

## 2017-04-12 NOTE — Patient Instructions (Signed)
Discharge instructions given. Handouts on polyps and hemorrhoids. Resume previous medications. YOU HAD AN ENDOSCOPIC PROCEDURE TODAY AT THE  ENDOSCOPY CENTER:   Refer to the procedure report that was given to you for any specific questions about what was found during the examination.  If the procedure report does not answer your questions, please call your gastroenterologist to clarify.  If you requested that your care partner not be given the details of your procedure findings, then the procedure report has been included in a sealed envelope for you to review at your convenience later.  YOU SHOULD EXPECT: Some feelings of bloating in the abdomen. Passage of more gas than usual.  Walking can help get rid of the air that was put into your GI tract during the procedure and reduce the bloating. If you had a lower endoscopy (such as a colonoscopy or flexible sigmoidoscopy) you may notice spotting of blood in your stool or on the toilet paper. If you underwent a bowel prep for your procedure, you may not have a normal bowel movement for a few days.  Please Note:  You might notice some irritation and congestion in your nose or some drainage.  This is from the oxygen used during your procedure.  There is no need for concern and it should clear up in a day or so.  SYMPTOMS TO REPORT IMMEDIATELY:   Following lower endoscopy (colonoscopy or flexible sigmoidoscopy):  Excessive amounts of blood in the stool  Significant tenderness or worsening of abdominal pains  Swelling of the abdomen that is new, acute  Fever of 100F or higher   For urgent or emergent issues, a gastroenterologist can be reached at any hour by calling [phone].   DIET:  We do recommend a small meal at first, but then you may proceed to your regular diet.  Drink plenty of fluids but you should avoid alcoholic beverages for 24 hours.  ACTIVITY:  You should plan to take it easy for the rest of today and you should NOT DRIVE  or use heavy machinery until tomorrow (because of the sedation medicines used during the test).    FOLLOW UP: Our staff will call the number listed on your records the next business day following your procedure to check on you and address any questions or concerns that you may have regarding the information given to you following your procedure. If we do not reach you, we will leave a message.  However, if you are feeling well and you are not experiencing any problems, there is no need to return our call.  We will assume that you have returned to your regular daily activities without incident.  If any biopsies were taken you will be contacted by phone or by letter within the next 1-3 weeks.  Please call us at [phone] if you have not heard about the biopsies in 3 weeks.    SIGNATURES/CONFIDENTIALITY: You and/or your care partner have signed paperwork which will be entered into your electronic medical record.  These signatures attest to the fact that that the information above on your After Visit Summary has been reviewed and is understood.  Full responsibility of the confidentiality of this discharge information lies with you and/or your care-partner. 

## 2017-04-13 ENCOUNTER — Telehealth: Payer: Self-pay

## 2017-04-13 NOTE — Telephone Encounter (Signed)
  Follow up Call-  Call back number 04/12/2017  Post procedure Call Back phone  # [phone] hm  Permission to leave phone message Yes  Some recent data might be hidden     Patient questions:  Do you have a fever, pain , or abdominal swelling? No. Pain Score  0 *  Have you tolerated food without any problems? Yes.    Have you been able to return to your normal activities? Yes.    Do you have any questions about your discharge instructions: Diet   No. Medications  No. Follow up visit  No.  Do you have questions or concerns about your Care? No.  Actions: * If pain score is 4 or above: No action needed, pain <4.

## 2017-04-19 ENCOUNTER — Encounter: Payer: Self-pay | Admitting: Internal Medicine

## 2017-05-10 DIAGNOSIS — E784 Other hyperlipidemia: Secondary | ICD-10-CM | POA: Diagnosis not present

## 2017-05-10 DIAGNOSIS — E781 Pure hyperglyceridemia: Secondary | ICD-10-CM | POA: Diagnosis not present

## 2017-05-10 DIAGNOSIS — E119 Type 2 diabetes mellitus without complications: Secondary | ICD-10-CM | POA: Diagnosis not present

## 2017-05-10 DIAGNOSIS — I10 Essential (primary) hypertension: Secondary | ICD-10-CM | POA: Diagnosis not present

## 2017-05-10 DIAGNOSIS — E1165 Type 2 diabetes mellitus with hyperglycemia: Secondary | ICD-10-CM | POA: Diagnosis not present

## 2017-06-09 ENCOUNTER — Other Ambulatory Visit: Payer: Self-pay | Admitting: Internal Medicine

## 2017-06-09 DIAGNOSIS — Z1231 Encounter for screening mammogram for malignant neoplasm of breast: Secondary | ICD-10-CM

## 2017-06-20 DIAGNOSIS — I10 Essential (primary) hypertension: Secondary | ICD-10-CM | POA: Diagnosis not present

## 2017-06-20 DIAGNOSIS — E784 Other hyperlipidemia: Secondary | ICD-10-CM | POA: Diagnosis not present

## 2017-06-20 DIAGNOSIS — Z6829 Body mass index (BMI) 29.0-29.9, adult: Secondary | ICD-10-CM | POA: Diagnosis not present

## 2017-06-20 DIAGNOSIS — E119 Type 2 diabetes mellitus without complications: Secondary | ICD-10-CM | POA: Diagnosis not present

## 2017-06-21 ENCOUNTER — Ambulatory Visit
Admission: RE | Admit: 2017-06-21 | Discharge: 2017-06-21 | Disposition: A | Discharge status: Home / Self Care | Payer: BLUE CROSS/BLUE SHIELD | Source: Ambulatory Visit | Attending: Internal Medicine | Admitting: Internal Medicine

## 2017-06-21 DIAGNOSIS — Z1231 Encounter for screening mammogram for malignant neoplasm of breast: Secondary | ICD-10-CM | POA: Diagnosis not present

## 2017-06-26 DIAGNOSIS — L821 Other seborrheic keratosis: Secondary | ICD-10-CM | POA: Diagnosis not present

## 2017-06-26 DIAGNOSIS — L918 Other hypertrophic disorders of the skin: Secondary | ICD-10-CM | POA: Diagnosis not present

## 2017-06-26 DIAGNOSIS — B078 Other viral warts: Secondary | ICD-10-CM | POA: Diagnosis not present

## 2017-06-26 DIAGNOSIS — Z85828 Personal history of other malignant neoplasm of skin: Secondary | ICD-10-CM | POA: Diagnosis not present

## 2017-06-26 DIAGNOSIS — C44712 Basal cell carcinoma of skin of right lower limb, including hip: Secondary | ICD-10-CM | POA: Diagnosis not present

## 2017-07-13 DIAGNOSIS — E785 Hyperlipidemia, unspecified: Secondary | ICD-10-CM | POA: Diagnosis not present

## 2017-07-13 DIAGNOSIS — I10 Essential (primary) hypertension: Secondary | ICD-10-CM | POA: Diagnosis not present

## 2017-07-13 DIAGNOSIS — E1165 Type 2 diabetes mellitus with hyperglycemia: Secondary | ICD-10-CM | POA: Diagnosis not present

## 2017-07-13 DIAGNOSIS — Z794 Long term (current) use of insulin: Secondary | ICD-10-CM | POA: Diagnosis not present

## 2017-08-29 DIAGNOSIS — I10 Essential (primary) hypertension: Secondary | ICD-10-CM | POA: Diagnosis not present

## 2017-08-29 DIAGNOSIS — Z23 Encounter for immunization: Secondary | ICD-10-CM | POA: Diagnosis not present

## 2017-09-12 DIAGNOSIS — H5203 Hypermetropia, bilateral: Secondary | ICD-10-CM | POA: Diagnosis not present

## 2017-09-14 DIAGNOSIS — E785 Hyperlipidemia, unspecified: Secondary | ICD-10-CM | POA: Diagnosis not present

## 2017-09-14 DIAGNOSIS — E1165 Type 2 diabetes mellitus with hyperglycemia: Secondary | ICD-10-CM | POA: Diagnosis not present

## 2017-09-14 DIAGNOSIS — I10 Essential (primary) hypertension: Secondary | ICD-10-CM | POA: Diagnosis not present

## 2017-09-14 DIAGNOSIS — Z794 Long term (current) use of insulin: Secondary | ICD-10-CM | POA: Diagnosis not present

## 2017-09-18 DIAGNOSIS — J309 Allergic rhinitis, unspecified: Secondary | ICD-10-CM | POA: Diagnosis not present

## 2017-09-25 DIAGNOSIS — J309 Allergic rhinitis, unspecified: Secondary | ICD-10-CM | POA: Diagnosis not present

## 2017-10-03 DIAGNOSIS — R05 Cough: Secondary | ICD-10-CM | POA: Diagnosis not present

## 2017-10-03 DIAGNOSIS — Z6829 Body mass index (BMI) 29.0-29.9, adult: Secondary | ICD-10-CM | POA: Diagnosis not present

## 2017-10-03 DIAGNOSIS — J019 Acute sinusitis, unspecified: Secondary | ICD-10-CM | POA: Diagnosis not present

## 2017-12-27 DIAGNOSIS — I10 Essential (primary) hypertension: Secondary | ICD-10-CM | POA: Diagnosis not present

## 2017-12-27 DIAGNOSIS — E119 Type 2 diabetes mellitus without complications: Secondary | ICD-10-CM | POA: Diagnosis not present

## 2017-12-27 DIAGNOSIS — E7849 Other hyperlipidemia: Secondary | ICD-10-CM | POA: Diagnosis not present

## 2017-12-27 DIAGNOSIS — R82998 Other abnormal findings in urine: Secondary | ICD-10-CM | POA: Diagnosis not present

## 2018-01-03 ENCOUNTER — Other Ambulatory Visit (HOSPITAL_COMMUNITY): Payer: Self-pay | Admitting: Internal Medicine

## 2018-01-03 DIAGNOSIS — I10 Essential (primary) hypertension: Secondary | ICD-10-CM | POA: Diagnosis not present

## 2018-01-03 DIAGNOSIS — E7849 Other hyperlipidemia: Secondary | ICD-10-CM | POA: Diagnosis not present

## 2018-01-03 DIAGNOSIS — E781 Pure hyperglyceridemia: Secondary | ICD-10-CM | POA: Diagnosis not present

## 2018-01-03 DIAGNOSIS — E119 Type 2 diabetes mellitus without complications: Secondary | ICD-10-CM | POA: Diagnosis not present

## 2018-01-03 DIAGNOSIS — Z0000 Encounter for general adult medical examination without abnormal findings: Secondary | ICD-10-CM | POA: Diagnosis not present

## 2018-01-03 DIAGNOSIS — I6529 Occlusion and stenosis of unspecified carotid artery: Secondary | ICD-10-CM

## 2018-01-03 DIAGNOSIS — Z1389 Encounter for screening for other disorder: Secondary | ICD-10-CM | POA: Diagnosis not present

## 2018-01-05 DIAGNOSIS — Z1212 Encounter for screening for malignant neoplasm of rectum: Secondary | ICD-10-CM | POA: Diagnosis not present

## 2018-01-08 ENCOUNTER — Ambulatory Visit (HOSPITAL_COMMUNITY)
Admission: RE | Admit: 2018-01-08 | Discharge: 2018-01-08 | Disposition: A | Discharge status: Home / Self Care | Payer: BLUE CROSS/BLUE SHIELD | Source: Ambulatory Visit | Attending: Internal Medicine | Admitting: Internal Medicine

## 2018-01-08 DIAGNOSIS — I659 Occlusion and stenosis of unspecified precerebral artery: Secondary | ICD-10-CM | POA: Diagnosis present

## 2018-01-08 DIAGNOSIS — E785 Hyperlipidemia, unspecified: Secondary | ICD-10-CM | POA: Diagnosis not present

## 2018-01-08 DIAGNOSIS — E1165 Type 2 diabetes mellitus with hyperglycemia: Secondary | ICD-10-CM | POA: Diagnosis not present

## 2018-01-08 DIAGNOSIS — I10 Essential (primary) hypertension: Secondary | ICD-10-CM | POA: Diagnosis not present

## 2018-01-08 DIAGNOSIS — I6529 Occlusion and stenosis of unspecified carotid artery: Secondary | ICD-10-CM | POA: Diagnosis not present

## 2018-01-08 DIAGNOSIS — Z794 Long term (current) use of insulin: Secondary | ICD-10-CM | POA: Diagnosis not present

## 2018-01-10 DIAGNOSIS — Z1151 Encounter for screening for human papillomavirus (HPV): Secondary | ICD-10-CM | POA: Diagnosis not present

## 2018-01-31 DIAGNOSIS — M9902 Segmental and somatic dysfunction of thoracic region: Secondary | ICD-10-CM | POA: Diagnosis not present

## 2018-01-31 DIAGNOSIS — M9903 Segmental and somatic dysfunction of lumbar region: Secondary | ICD-10-CM | POA: Diagnosis not present

## 2018-01-31 DIAGNOSIS — M9905 Segmental and somatic dysfunction of pelvic region: Secondary | ICD-10-CM | POA: Diagnosis not present

## 2018-01-31 DIAGNOSIS — M545 Low back pain: Secondary | ICD-10-CM | POA: Diagnosis not present

## 2018-02-05 DIAGNOSIS — M9902 Segmental and somatic dysfunction of thoracic region: Secondary | ICD-10-CM | POA: Diagnosis not present

## 2018-02-05 DIAGNOSIS — M9905 Segmental and somatic dysfunction of pelvic region: Secondary | ICD-10-CM | POA: Diagnosis not present

## 2018-02-05 DIAGNOSIS — M545 Low back pain: Secondary | ICD-10-CM | POA: Diagnosis not present

## 2018-02-05 DIAGNOSIS — M9903 Segmental and somatic dysfunction of lumbar region: Secondary | ICD-10-CM | POA: Diagnosis not present

## 2018-02-07 DIAGNOSIS — M9903 Segmental and somatic dysfunction of lumbar region: Secondary | ICD-10-CM | POA: Diagnosis not present

## 2018-02-07 DIAGNOSIS — M545 Low back pain: Secondary | ICD-10-CM | POA: Diagnosis not present

## 2018-02-07 DIAGNOSIS — M9902 Segmental and somatic dysfunction of thoracic region: Secondary | ICD-10-CM | POA: Diagnosis not present

## 2018-02-07 DIAGNOSIS — M9905 Segmental and somatic dysfunction of pelvic region: Secondary | ICD-10-CM | POA: Diagnosis not present

## 2018-02-12 DIAGNOSIS — M9902 Segmental and somatic dysfunction of thoracic region: Secondary | ICD-10-CM | POA: Diagnosis not present

## 2018-02-12 DIAGNOSIS — M9905 Segmental and somatic dysfunction of pelvic region: Secondary | ICD-10-CM | POA: Diagnosis not present

## 2018-02-12 DIAGNOSIS — M9903 Segmental and somatic dysfunction of lumbar region: Secondary | ICD-10-CM | POA: Diagnosis not present

## 2018-02-12 DIAGNOSIS — M545 Low back pain: Secondary | ICD-10-CM | POA: Diagnosis not present

## 2018-03-12 DIAGNOSIS — C44719 Basal cell carcinoma of skin of left lower limb, including hip: Secondary | ICD-10-CM | POA: Diagnosis not present

## 2018-03-12 DIAGNOSIS — Z85828 Personal history of other malignant neoplasm of skin: Secondary | ICD-10-CM | POA: Diagnosis not present

## 2018-03-12 DIAGNOSIS — C44712 Basal cell carcinoma of skin of right lower limb, including hip: Secondary | ICD-10-CM | POA: Diagnosis not present

## 2018-03-12 DIAGNOSIS — L821 Other seborrheic keratosis: Secondary | ICD-10-CM | POA: Diagnosis not present

## 2018-04-10 DIAGNOSIS — Z794 Long term (current) use of insulin: Secondary | ICD-10-CM | POA: Diagnosis not present

## 2018-04-10 DIAGNOSIS — E785 Hyperlipidemia, unspecified: Secondary | ICD-10-CM | POA: Diagnosis not present

## 2018-04-10 DIAGNOSIS — E1165 Type 2 diabetes mellitus with hyperglycemia: Secondary | ICD-10-CM | POA: Diagnosis not present

## 2018-04-10 DIAGNOSIS — I10 Essential (primary) hypertension: Secondary | ICD-10-CM | POA: Diagnosis not present

## 2018-05-22 ENCOUNTER — Other Ambulatory Visit: Payer: Self-pay | Admitting: Internal Medicine

## 2018-05-22 DIAGNOSIS — Z1231 Encounter for screening mammogram for malignant neoplasm of breast: Secondary | ICD-10-CM

## 2018-06-26 DIAGNOSIS — D2261 Melanocytic nevi of right upper limb, including shoulder: Secondary | ICD-10-CM | POA: Diagnosis not present

## 2018-06-26 DIAGNOSIS — D2262 Melanocytic nevi of left upper limb, including shoulder: Secondary | ICD-10-CM | POA: Diagnosis not present

## 2018-06-26 DIAGNOSIS — C44712 Basal cell carcinoma of skin of right lower limb, including hip: Secondary | ICD-10-CM | POA: Diagnosis not present

## 2018-06-26 DIAGNOSIS — Z85828 Personal history of other malignant neoplasm of skin: Secondary | ICD-10-CM | POA: Diagnosis not present

## 2018-06-26 DIAGNOSIS — D225 Melanocytic nevi of trunk: Secondary | ICD-10-CM | POA: Diagnosis not present

## 2018-06-26 DIAGNOSIS — C44719 Basal cell carcinoma of skin of left lower limb, including hip: Secondary | ICD-10-CM | POA: Diagnosis not present

## 2018-07-03 ENCOUNTER — Inpatient Hospital Stay: Admission: RE | Admit: 2018-07-03 | Payer: BLUE CROSS/BLUE SHIELD | Source: Ambulatory Visit

## 2018-07-13 DIAGNOSIS — E1165 Type 2 diabetes mellitus with hyperglycemia: Secondary | ICD-10-CM | POA: Diagnosis not present

## 2018-07-13 DIAGNOSIS — E785 Hyperlipidemia, unspecified: Secondary | ICD-10-CM | POA: Diagnosis not present

## 2018-07-13 DIAGNOSIS — Z794 Long term (current) use of insulin: Secondary | ICD-10-CM | POA: Diagnosis not present

## 2018-07-13 DIAGNOSIS — I10 Essential (primary) hypertension: Secondary | ICD-10-CM | POA: Diagnosis not present

## 2018-07-25 ENCOUNTER — Ambulatory Visit: Payer: BLUE CROSS/BLUE SHIELD

## 2018-07-25 ENCOUNTER — Ambulatory Visit
Admission: RE | Admit: 2018-07-25 | Discharge: 2018-07-25 | Disposition: A | Discharge status: Home / Self Care | Payer: BLUE CROSS/BLUE SHIELD | Source: Ambulatory Visit | Attending: Internal Medicine | Admitting: Internal Medicine

## 2018-07-25 DIAGNOSIS — Z1231 Encounter for screening mammogram for malignant neoplasm of breast: Secondary | ICD-10-CM | POA: Diagnosis not present

## 2018-08-13 DIAGNOSIS — Z23 Encounter for immunization: Secondary | ICD-10-CM | POA: Diagnosis not present

## 2018-09-20 DIAGNOSIS — J069 Acute upper respiratory infection, unspecified: Secondary | ICD-10-CM | POA: Diagnosis not present

## 2018-10-08 DIAGNOSIS — H5203 Hypermetropia, bilateral: Secondary | ICD-10-CM | POA: Diagnosis not present

## 2018-10-17 DIAGNOSIS — I10 Essential (primary) hypertension: Secondary | ICD-10-CM | POA: Diagnosis not present

## 2018-10-17 DIAGNOSIS — E785 Hyperlipidemia, unspecified: Secondary | ICD-10-CM | POA: Diagnosis not present

## 2018-10-17 DIAGNOSIS — Z794 Long term (current) use of insulin: Secondary | ICD-10-CM | POA: Diagnosis not present

## 2018-10-17 DIAGNOSIS — E1165 Type 2 diabetes mellitus with hyperglycemia: Secondary | ICD-10-CM | POA: Diagnosis not present

## 2018-11-06 DIAGNOSIS — Z6830 Body mass index (BMI) 30.0-30.9, adult: Secondary | ICD-10-CM | POA: Diagnosis not present

## 2018-11-06 DIAGNOSIS — I10 Essential (primary) hypertension: Secondary | ICD-10-CM | POA: Diagnosis not present

## 2018-11-06 DIAGNOSIS — J0100 Acute maxillary sinusitis, unspecified: Secondary | ICD-10-CM | POA: Diagnosis not present

## 2018-11-06 DIAGNOSIS — E119 Type 2 diabetes mellitus without complications: Secondary | ICD-10-CM | POA: Diagnosis not present

## 2019-01-03 DIAGNOSIS — Z0000 Encounter for general adult medical examination without abnormal findings: Secondary | ICD-10-CM | POA: Diagnosis not present

## 2019-01-03 DIAGNOSIS — R82998 Other abnormal findings in urine: Secondary | ICD-10-CM | POA: Diagnosis not present

## 2019-01-03 DIAGNOSIS — E119 Type 2 diabetes mellitus without complications: Secondary | ICD-10-CM | POA: Diagnosis not present

## 2019-01-10 DIAGNOSIS — I10 Essential (primary) hypertension: Secondary | ICD-10-CM | POA: Diagnosis not present

## 2019-01-10 DIAGNOSIS — E7849 Other hyperlipidemia: Secondary | ICD-10-CM | POA: Diagnosis not present

## 2019-01-10 DIAGNOSIS — E781 Pure hyperglyceridemia: Secondary | ICD-10-CM | POA: Diagnosis not present

## 2019-01-10 DIAGNOSIS — Z1331 Encounter for screening for depression: Secondary | ICD-10-CM | POA: Diagnosis not present

## 2019-01-10 DIAGNOSIS — E119 Type 2 diabetes mellitus without complications: Secondary | ICD-10-CM | POA: Diagnosis not present

## 2019-01-10 DIAGNOSIS — Z0000 Encounter for general adult medical examination without abnormal findings: Secondary | ICD-10-CM | POA: Diagnosis not present

## 2019-01-15 DIAGNOSIS — Z1212 Encounter for screening for malignant neoplasm of rectum: Secondary | ICD-10-CM | POA: Diagnosis not present

## 2019-01-16 ENCOUNTER — Encounter (HOSPITAL_COMMUNITY): Payer: BLUE CROSS/BLUE SHIELD

## 2019-01-17 DIAGNOSIS — I10 Essential (primary) hypertension: Secondary | ICD-10-CM | POA: Diagnosis not present

## 2019-01-17 DIAGNOSIS — E785 Hyperlipidemia, unspecified: Secondary | ICD-10-CM | POA: Diagnosis not present

## 2019-01-17 DIAGNOSIS — Z794 Long term (current) use of insulin: Secondary | ICD-10-CM | POA: Diagnosis not present

## 2019-01-17 DIAGNOSIS — E1165 Type 2 diabetes mellitus with hyperglycemia: Secondary | ICD-10-CM | POA: Diagnosis not present

## 2019-01-24 DIAGNOSIS — E781 Pure hyperglyceridemia: Secondary | ICD-10-CM | POA: Diagnosis not present

## 2019-01-24 DIAGNOSIS — L723 Sebaceous cyst: Secondary | ICD-10-CM | POA: Diagnosis not present

## 2019-01-24 DIAGNOSIS — R195 Other fecal abnormalities: Secondary | ICD-10-CM | POA: Diagnosis not present

## 2019-05-06 DIAGNOSIS — E785 Hyperlipidemia, unspecified: Secondary | ICD-10-CM | POA: Diagnosis not present

## 2019-05-06 DIAGNOSIS — I10 Essential (primary) hypertension: Secondary | ICD-10-CM | POA: Diagnosis not present

## 2019-05-06 DIAGNOSIS — E1165 Type 2 diabetes mellitus with hyperglycemia: Secondary | ICD-10-CM | POA: Diagnosis not present

## 2019-05-06 DIAGNOSIS — Z794 Long term (current) use of insulin: Secondary | ICD-10-CM | POA: Diagnosis not present

## 2019-06-21 ENCOUNTER — Other Ambulatory Visit: Payer: Self-pay | Admitting: Internal Medicine

## 2019-06-21 DIAGNOSIS — Z1231 Encounter for screening mammogram for malignant neoplasm of breast: Secondary | ICD-10-CM

## 2019-07-01 DIAGNOSIS — C44712 Basal cell carcinoma of skin of right lower limb, including hip: Secondary | ICD-10-CM | POA: Diagnosis not present

## 2019-07-01 DIAGNOSIS — D2262 Melanocytic nevi of left upper limb, including shoulder: Secondary | ICD-10-CM | POA: Diagnosis not present

## 2019-07-01 DIAGNOSIS — C44612 Basal cell carcinoma of skin of right upper limb, including shoulder: Secondary | ICD-10-CM | POA: Diagnosis not present

## 2019-07-01 DIAGNOSIS — L821 Other seborrheic keratosis: Secondary | ICD-10-CM | POA: Diagnosis not present

## 2019-07-01 DIAGNOSIS — D2261 Melanocytic nevi of right upper limb, including shoulder: Secondary | ICD-10-CM | POA: Diagnosis not present

## 2019-07-01 DIAGNOSIS — Z85828 Personal history of other malignant neoplasm of skin: Secondary | ICD-10-CM | POA: Diagnosis not present

## 2019-08-05 ENCOUNTER — Ambulatory Visit
Admission: RE | Admit: 2019-08-05 | Discharge: 2019-08-05 | Disposition: A | Discharge status: Home / Self Care | Payer: BLUE CROSS/BLUE SHIELD | Source: Ambulatory Visit | Attending: Internal Medicine | Admitting: Internal Medicine

## 2019-08-05 ENCOUNTER — Other Ambulatory Visit: Payer: Self-pay

## 2019-08-05 DIAGNOSIS — Z1231 Encounter for screening mammogram for malignant neoplasm of breast: Secondary | ICD-10-CM | POA: Diagnosis not present

## 2019-08-09 DIAGNOSIS — E1165 Type 2 diabetes mellitus with hyperglycemia: Secondary | ICD-10-CM | POA: Diagnosis not present

## 2019-08-09 DIAGNOSIS — Z23 Encounter for immunization: Secondary | ICD-10-CM | POA: Diagnosis not present

## 2019-08-09 DIAGNOSIS — Z794 Long term (current) use of insulin: Secondary | ICD-10-CM | POA: Diagnosis not present

## 2019-09-11 DIAGNOSIS — M9902 Segmental and somatic dysfunction of thoracic region: Secondary | ICD-10-CM | POA: Diagnosis not present

## 2019-09-11 DIAGNOSIS — M9903 Segmental and somatic dysfunction of lumbar region: Secondary | ICD-10-CM | POA: Diagnosis not present

## 2019-09-11 DIAGNOSIS — M9905 Segmental and somatic dysfunction of pelvic region: Secondary | ICD-10-CM | POA: Diagnosis not present

## 2019-09-11 DIAGNOSIS — M545 Low back pain: Secondary | ICD-10-CM | POA: Diagnosis not present

## 2019-09-13 DIAGNOSIS — M545 Low back pain: Secondary | ICD-10-CM | POA: Diagnosis not present

## 2019-09-13 DIAGNOSIS — M9902 Segmental and somatic dysfunction of thoracic region: Secondary | ICD-10-CM | POA: Diagnosis not present

## 2019-09-13 DIAGNOSIS — M9903 Segmental and somatic dysfunction of lumbar region: Secondary | ICD-10-CM | POA: Diagnosis not present

## 2019-09-13 DIAGNOSIS — M9905 Segmental and somatic dysfunction of pelvic region: Secondary | ICD-10-CM | POA: Diagnosis not present

## 2019-09-16 DIAGNOSIS — G5701 Lesion of sciatic nerve, right lower limb: Secondary | ICD-10-CM | POA: Diagnosis not present

## 2019-09-17 DIAGNOSIS — M9905 Segmental and somatic dysfunction of pelvic region: Secondary | ICD-10-CM | POA: Diagnosis not present

## 2019-09-17 DIAGNOSIS — M9902 Segmental and somatic dysfunction of thoracic region: Secondary | ICD-10-CM | POA: Diagnosis not present

## 2019-09-17 DIAGNOSIS — M9903 Segmental and somatic dysfunction of lumbar region: Secondary | ICD-10-CM | POA: Diagnosis not present

## 2019-09-17 DIAGNOSIS — M545 Low back pain: Secondary | ICD-10-CM | POA: Diagnosis not present

## 2019-10-01 DIAGNOSIS — M9902 Segmental and somatic dysfunction of thoracic region: Secondary | ICD-10-CM | POA: Diagnosis not present

## 2019-10-01 DIAGNOSIS — M545 Low back pain: Secondary | ICD-10-CM | POA: Diagnosis not present

## 2019-10-01 DIAGNOSIS — M9905 Segmental and somatic dysfunction of pelvic region: Secondary | ICD-10-CM | POA: Diagnosis not present

## 2019-10-01 DIAGNOSIS — M9903 Segmental and somatic dysfunction of lumbar region: Secondary | ICD-10-CM | POA: Diagnosis not present

## 2019-10-16 DIAGNOSIS — H5203 Hypermetropia, bilateral: Secondary | ICD-10-CM | POA: Diagnosis not present

## 2019-10-22 DIAGNOSIS — Z20828 Contact with and (suspected) exposure to other viral communicable diseases: Secondary | ICD-10-CM | POA: Diagnosis not present

## 2019-10-22 DIAGNOSIS — R05 Cough: Secondary | ICD-10-CM | POA: Diagnosis not present

## 2019-10-26 ENCOUNTER — Emergency Department (HOSPITAL_COMMUNITY): Payer: BC Managed Care – PPO

## 2019-10-26 ENCOUNTER — Other Ambulatory Visit: Payer: Self-pay

## 2019-10-26 ENCOUNTER — Encounter (HOSPITAL_COMMUNITY): Payer: Self-pay | Admitting: Emergency Medicine

## 2019-10-26 ENCOUNTER — Emergency Department (HOSPITAL_COMMUNITY)
Admission: EM | Admit: 2019-10-26 | Discharge: 2019-10-27 | Disposition: A | Discharge status: Home / Self Care | Payer: BC Managed Care – PPO | Attending: Emergency Medicine | Admitting: Emergency Medicine

## 2019-10-26 DIAGNOSIS — Z7982 Long term (current) use of aspirin: Secondary | ICD-10-CM | POA: Insufficient documentation

## 2019-10-26 DIAGNOSIS — I2510 Atherosclerotic heart disease of native coronary artery without angina pectoris: Secondary | ICD-10-CM | POA: Diagnosis not present

## 2019-10-26 DIAGNOSIS — R590 Localized enlarged lymph nodes: Secondary | ICD-10-CM | POA: Diagnosis not present

## 2019-10-26 DIAGNOSIS — I10 Essential (primary) hypertension: Secondary | ICD-10-CM | POA: Insufficient documentation

## 2019-10-26 DIAGNOSIS — R1030 Lower abdominal pain, unspecified: Secondary | ICD-10-CM | POA: Diagnosis present

## 2019-10-26 DIAGNOSIS — I700 Atherosclerosis of aorta: Secondary | ICD-10-CM | POA: Diagnosis not present

## 2019-10-26 DIAGNOSIS — E119 Type 2 diabetes mellitus without complications: Secondary | ICD-10-CM | POA: Diagnosis not present

## 2019-10-26 DIAGNOSIS — E041 Nontoxic single thyroid nodule: Secondary | ICD-10-CM | POA: Diagnosis not present

## 2019-10-26 DIAGNOSIS — Z79899 Other long term (current) drug therapy: Secondary | ICD-10-CM | POA: Insufficient documentation

## 2019-10-26 DIAGNOSIS — R109 Unspecified abdominal pain: Secondary | ICD-10-CM | POA: Diagnosis not present

## 2019-10-26 LAB — COMPREHENSIVE METABOLIC PANEL
ALT: 30 U/L (ref 0–44)
AST: 29 U/L (ref 15–41)
Albumin: 3.5 g/dL (ref 3.5–5.0)
Alkaline Phosphatase: 55 U/L (ref 38–126)
Anion gap: 8 (ref 5–15)
BUN: 14 mg/dL (ref 8–23)
CO2: 28 mmol/L (ref 22–32)
Calcium: 8.6 mg/dL — ABNORMAL LOW (ref 8.9–10.3)
Chloride: 101 mmol/L (ref 98–111)
Creatinine, Ser: 0.64 mg/dL (ref 0.44–1.00)
GFR calc Af Amer: >60 mL/min (ref >60)
GFR calc non Af Amer: >60 mL/min (ref >60)
Glucose, Bld: 238 mg/dL — ABNORMAL HIGH (ref 70–99)
Potassium: 3.3 mmol/L — ABNORMAL LOW (ref 3.5–5.1)
Sodium: 137 mmol/L (ref 135–145)
Total Bilirubin: 0.4 mg/dL (ref 0.3–1.2)
Total Protein: 6.2 g/dL — ABNORMAL LOW (ref 6.5–8.1)

## 2019-10-26 LAB — URINALYSIS, ROUTINE W REFLEX MICROSCOPIC
Bilirubin Urine: NEGATIVE
Glucose, UA: NEGATIVE mg/dL
Ketones, ur: NEGATIVE mg/dL
Nitrite: NEGATIVE
Protein, ur: NEGATIVE mg/dL
Specific Gravity, Urine: >1.03 — ABNORMAL HIGH (ref 1.005–1.030)
pH: 5.5 (ref 5.0–8.0)

## 2019-10-26 LAB — URINALYSIS, MICROSCOPIC (REFLEX)

## 2019-10-26 LAB — CBC
HCT: 40.8 % (ref 36.0–46.0)
Hemoglobin: 12.8 g/dL (ref 12.0–15.0)
MCH: 24.8 pg — ABNORMAL LOW (ref 26.0–34.0)
MCHC: 31.4 g/dL (ref 30.0–36.0)
MCV: 79.1 fL — ABNORMAL LOW (ref 80.0–100.0)
Platelets: 180 10*3/uL (ref 150–400)
RBC: 5.16 MIL/uL — ABNORMAL HIGH (ref 3.87–5.11)
RDW: 13.9 % (ref 11.5–15.5)
WBC: 9.7 10*3/uL (ref 4.0–10.5)
nRBC: 0 % (ref 0.0–0.2)

## 2019-10-26 LAB — TROPONIN I (HIGH SENSITIVITY)
Troponin I (High Sensitivity): 5 ng/L (ref <18)
Troponin I (High Sensitivity): 6 ng/L (ref <18)

## 2019-10-26 LAB — LIPASE, BLOOD: Lipase: 28 U/L (ref 11–51)

## 2019-10-26 LAB — LACTATE DEHYDROGENASE: LDH: 343 U/L — ABNORMAL HIGH (ref 98–192)

## 2019-10-26 MED ORDER — SODIUM CHLORIDE 0.9% FLUSH: 3.0000 mL | Freq: Once | INTRAVENOUS | Status: DC

## 2019-10-26 MED ORDER — IOHEXOL 300 MG/ML  SOLN
100.0000 mL | Freq: Once | INTRAMUSCULAR | Status: AC | PRN
  Administered 2019-10-26: 100 mL via INTRAVENOUS

## 2019-10-26 MED ORDER — SODIUM CHLORIDE 0.9 % IV BOLUS
1000.0000 mL | Freq: Once | INTRAVENOUS | Status: AC
  Administered 2019-10-26: 1000 mL via INTRAVENOUS

## 2019-10-26 NOTE — ED Triage Notes (Signed)
C/o lower back pain and lower abd pain since 3am.  Denies nausea, vomiting, diarrhea, and urinary complaint.

## 2019-10-26 NOTE — ED Notes (Signed)
Back to c-t 

## 2019-10-26 NOTE — Discharge Instructions (Addendum)
If you develop worsening, continued, or recurrent abdominal pain, uncontrolled vomiting, fever, chest or back pain, or any other new/concerning symptoms then return to the ER for evaluation.   Your CT scan shows extensive swollen lymph nodes, in your abdomen, chest and neck.  The oncologist should be calling you on 12/28 but you can call this office if you have not heard from them.

## 2019-10-26 NOTE — ED Notes (Signed)
To ct

## 2019-10-26 NOTE — ED Provider Notes (Signed)
[facility] DEPART[facility] Provider Note   CSN: [id number] Arrival date & time: 10/26/19  1412     History Chief Complaint  Patient presents with  . Abdominal Pain  . Back Pain    Valerie Cline is a 63 y.o. female.  HPI 63 year old female presents with abdominal pain.  Started around 3 AM and awoke her from sleep.  Is diffuse across her entire lower abdomen and wraps around to her back.  No fever, vomiting, diarrhea, constipation or urinary symptoms.  No chest pain.  Has been pretty constant though at 1 point she was able to fall asleep.  She tried ibuprofen and Tylenol with no real relief.  Pain feels like a gnawing sensation.  Pain is moderate at this time. Nothing really makes it better or worse.  Past Medical History:  Diagnosis Date  . Allergy   . Blood in stool   . Carotid stenosis   . Diabetes mellitus without complication ([place])   . Elevated liver function tests   . External hemorrhoids without mention of complication   . Hyperlipidemia   . Hypertension     There are no problems to display for this patient.   Past Surgical History:  Procedure Laterality Date  . BREAST BIOPSY Right    needle biopsy  . COLONOSCOPY    . POLYPECTOMY    . Ruptured disc       OB History   No obstetric history on file.     Family History  Problem Relation Age of Onset  . Colon cancer Neg Hx   . Esophageal cancer Neg Hx   . Rectal cancer Neg Hx   . Stomach cancer Neg Hx   . Pancreatic cancer Neg Hx     Social History   Tobacco Use  . Smoking status: Never Smoker  . Smokeless tobacco: Never Used  Substance Use Topics  . Alcohol use: No  . Drug use: No    Home Medications Prior to Admission medications   Medication Sig Start Date End Date Taking? Authorizing Provider  amLODipine (NORVASC) 2.5 MG tablet Take 2.5 mg by mouth at bedtime.   Yes [provider]  aspirin EC 81 MG tablet Take 81 mg by mouth at bedtime.   Yes [provider]  benazepril (LOTENSIN) 20 MG tablet Take 20 mg by mouth at bedtime.    Yes [provider]  Dulaglutide (TRULICITY) 1.5 ES/9.2ZR SOPN Inject 1.5 mg into the skin every Tuesday.    Yes [provider]  hydrochlorothiazide 25 MG tablet Take 25 mg by mouth at bedtime.    Yes [provider]  ibuprofen (ADVIL) 200 MG tablet Take 200 mg by mouth every 6 (six) hours as needed for headache (pain).   Yes [provider]  Icosapent Ethyl (VASCEPA) 0.5 g CAPS Take 0.5 mg by mouth daily.   Yes [provider]  insulin regular human CONCENTRATED (HUMULIN R) 500 UNIT/ML SOLN injection Inject 50-70 Units into the skin See admin instructions. Inject 70 units subcutaneously before breakfast, and 50 units before lunch and supper - drawn on a U-500 syringe   Yes [provider]  naproxen (NAPROSYN) 500 MG tablet Take 500 mg by mouth daily as needed (pain).  09/16/19  Yes [provider]  rosuvastatin (CRESTOR) 10 MG tablet Take 10 mg by mouth at bedtime.   Yes [provider]    Allergies    Patient has no known allergies.  Review  of Systems   Review of Systems  Constitutional: Negative for fever.  Cardiovascular: Negative for chest pain.  Gastrointestinal: Positive for abdominal pain. Negative for constipation, diarrhea and vomiting.  Genitourinary: Negative for dysuria and hematuria.  Musculoskeletal: Positive for back pain.  All other systems reviewed and are negative.   Physical Exam Updated Vital Signs BP (!) 160/69   Pulse 78   Temp 98.1 F (36.7 C) (Oral)   Resp 17   SpO2 97%   Physical Exam Vitals and nursing note reviewed.  Constitutional:      Appearance: She is well-developed.  HENT:     Head: Normocephalic and atraumatic.     Right Ear: External ear normal.     Left Ear: External ear normal.     Nose: Nose normal.  Eyes:     General:        Right eye: No discharge.        Left eye: No  discharge.  Cardiovascular:     Rate and Rhythm: Normal rate and regular rhythm.     Heart sounds: Normal heart sounds.  Pulmonary:     Effort: Pulmonary effort is normal.     Breath sounds: Normal breath sounds.  Abdominal:     Palpations: Abdomen is soft.     Tenderness: There is abdominal tenderness (mild, upper, hard to localize). There is no right CVA tenderness or left CVA tenderness.  Musculoskeletal:     Thoracic back: No tenderness.     Lumbar back: No tenderness.  Skin:    General: Skin is warm and dry.  Neurological:     Mental Status: She is alert.  Psychiatric:        Mood and Affect: Mood is not anxious.     ED Results / Procedures / Treatments   Labs (all labs ordered are listed, but only abnormal results are displayed) Labs Reviewed  COMPREHENSIVE METABOLIC PANEL - Abnormal; Notable for the following components:      Result Value   Potassium 3.3 (*)    Glucose, Bld 238 (*)    Calcium 8.6 (*)    Total Protein 6.2 (*)    All other components within normal limits  CBC - Abnormal; Notable for the following components:   RBC 5.16 (*)    MCV 79.1 (*)    MCH 24.8 (*)    All other components within normal limits  URINALYSIS, ROUTINE W REFLEX MICROSCOPIC - Abnormal; Notable for the following components:   Color, Urine STRAW (*)    APPearance HAZY (*)    Specific Gravity, Urine >1.030 (*)    Hgb urine dipstick TRACE (*)    Leukocytes,Ua SMALL (*)    All other components within normal limits  URINALYSIS, MICROSCOPIC (REFLEX) - Abnormal; Notable for the following components:   Bacteria, UA RARE (*)    Non Squamous Epithelial PRESENT (*)    All other components within normal limits  LACTATE DEHYDROGENASE - Abnormal; Notable for the following components:   LDH 343 (*)    All other components within normal limits  LIPASE, BLOOD  TROPONIN I (HIGH SENSITIVITY)  TROPONIN I (HIGH SENSITIVITY)    EKG EKG Interpretation  Date/Time:  Saturday October 26 2019  19:33:40 EST Ventricular Rate:  74 PR Interval:    QRS Duration: 96 QT Interval:  428 QTC Calculation: 475 R Axis:   12 Text Interpretation: Sinus rhythm Low voltage, precordial leads nonspecific ST/T changes No old tracing to compare Confirmed by Sherwood Gambler [phone])  on 10/26/2019 8:58:12 PM   Radiology CT Chest Wo Contrast  Result Date: 10/26/2019 CLINICAL DATA:  Abdominal lymphadenopathy, concerning for lymphoma EXAM: CT CHEST WITHOUT CONTRAST TECHNIQUE: Multidetector CT imaging of the chest was performed following the standard protocol without IV contrast. COMPARISON:  None. FINDINGS: Cardiovascular: Scattered aortic atherosclerotic calcifications are seen without aneurysmal dilatation. Coronary artery and mitral valve calcifications are noted. The heart size is normal. There is no pericardial thickening or effusion. Mediastinum/Nodes: There extensive bilateral level 3, level 4, level 5 lymph nodes are seen within the lower neck. There is also extensive bilateral axillary adenopathy. Although limited due to the lack of intravenous contrast scattered small mediastinal lymph nodes are present. There is a heterogeneously enlarged left thyroid gland with a 2 cm lesion. Lungs/Pleura: Streaky atelectasis or scarring seen at both lung bases. No pulmonary nodules, focal consolidation or pleural effusion is seen. Upper abdomen: Partially visualized extensive mesenteric and retroperitoneal adenopathy seen. Mildly enlarged spleen is noted. Musculoskeletal/Chest wall: There is no chest wall mass or suspicious osseous finding. No acute osseous abnormality IMPRESSION: 1. Extensive bilateral neck and axillary adenopathy, suggestive of lymphoma as on the CT abdomen/pelvis. 2. Heterogeneously enlarged left thyroid gland with a 2 cm nodule. Would recommend dedicated thyroid ultrasound for follow-up exam. 3. Streaky atelectasis or scarring at both lung bases. 4.  Aortic Atherosclerosis (ICD10-I70.0).  Electronically Signed   By: Prudencio Pair M.D.   On: 10/26/2019 23:32   CT ABDOMEN PELVIS W CONTRAST  Result Date: 10/26/2019 CLINICAL DATA:  Abdominal pain, acute, nonlocalized. Patient states low back and R abdominal pain since 3 a.m. EXAM: CT ABDOMEN AND PELVIS WITH CONTRAST TECHNIQUE: Multidetector CT imaging of the abdomen and pelvis was performed using the standard protocol following bolus administration of intravenous contrast. CONTRAST:  139mL OMNIPAQUE IOHEXOL 300 MG/ML  SOLN COMPARISON:  MRI of the abdomen 06/13/2012 FINDINGS: Lower chest: Coronary artery calcifications are present. Calcifications are present at the mitral valve annulus. Heart size is normal. Linear atelectasis or scarring is present at the bases. No significant pleural or pericardial effusion is present. Hepatobiliary: Right posterolateral [place] nodule is stable. No new lesions are present. 7 mm stone is present at the neck of the gallbladder. No inflammatory changes are present. The common bile duct is within normal. Pancreas: Unremarkable. No pancreatic ductal dilatation or surrounding inflammatory changes. Spleen: Spleen is mildly enlarged measuring 12.5 cm cephalo caudad. No discrete lesions are present. Adrenals/Urinary Tract: The adrenal glands are normal bilaterally. Kidneys and ureters are within normal limits. No stone or mass lesion is present. The urinary bladder is within normal limits. Stomach/Bowel: The stomach and duodenum are within normal limits. Extensive adenopathy is present within the small bowel mesentery. Small bowel is within normal limits. The terminal ileum is normal. Appendix is not discretely visualized and may be surgically absent. The ascending and transverse colon are normal. Descending and sigmoid colon are normal. Vascular/Lymphatic: Extensive retroperitoneal adenopathy is present. Index left para-aortic node measures 2.8 x 2.0 x 2.9 cm. Multiple nodes are present at the porta hepatis. The largest  measures 3.6 x 2.4 cm. Numerous lymph nodes are present throughout the small bowel mesentery. Index node measures 3.3 x 2.3 cm on image 59. Enlarged retroperitoneal iliac nodes are present bilaterally. No significant inguinal adenopathy is present. Multiple smaller nodes are present throughout the colonic mesentery. Atherosclerotic calcifications are present in the abdominal aorta and branch vessels without aneurysm. Reproductive: Uterus and bilateral adnexa are unremarkable. Other: Of the small bowel extends into  a paraumbilical hernia. There is a wide opening. No incarceration is present. There is no obstruction. Musculoskeletal: Grade 2 anterolisthesis is present at L5-S1, measuring 16 mm. This is secondary to bilateral pars defects. Slight retrolisthesis is present at L4-5. There is slight retrolisthesis at L1-2. IMPRESSION: 1. Extensive retroperitoneal, mesenteric, and pelvic adenopathy. This is concerning for a lymphoproliferative disorder such as lymphoma. 2. No primary malignancy is evident. 3. Cholelithiasis without cholecystitis. 4. Mild splenomegaly, also suggesting lymphoproliferative disease. 5. Paraumbilical hernia contains a loop of small bowel without obstruction 6. Grade 2 anterolisthesis at L5-S1 secondary to bilateral pars defects. 7. Coronary artery disease. 8. Aortic Atherosclerosis (ICD10-I70.0). These results were called by telephone at the time of interpretation on 10/26/2019 at 10:03 pm to provider Sherwood Gambler , who verbally acknowledged these results. Electronically Signed   By: San Morelle M.D.   On: 10/26/2019 22:03    Procedures Procedures (including critical care time)  Medications Ordered in ED Medications  sodium chloride flush (NS) 0.9 % injection 3 mL (has no administration in time range)  sodium chloride 0.9 % bolus 1,000 mL (0 mLs Intravenous Stopped 10/26/19 2055)  iohexol (OMNIPAQUE) 300 MG/ML solution 100 mL (100 mLs Intravenous Contrast Given 10/26/19  2111)    ED Course  I have reviewed the triage vital signs and the nursing notes.  Pertinent labs & imaging results that were available during my care of the patient were reviewed by me and considered in my medical decision making (see chart for details).    MDM Rules/Calculators/A&P                      Patient CT scan shows diffuse lymphadenopathy that is concerning for lymphoma.  I discussed with Dr. Marin Olp, oncology, who recommends CT chest and LDH.  Given her diabetes, age and female gender, there is at least some need for work-up for ACS given vague abdominal pain but has troponins negative x2.  Nonspecific ECG but no old to compare to.  My suspicion is this is not ACS but rather the lymphadenopathy or possibly even the gallstone.  Either way, she does appear stable for outpatient work-up, and Dr. Marin Olp will arrange for the oncologic work-up.  We discussed return precautions. Final Clinical Impression(s) / ED Diagnoses Final diagnoses:  Lymphadenopathy, abdominal    Rx / DC Orders ED Discharge Orders    None       Sherwood Gambler, MD 10/26/19 2357

## 2019-10-28 ENCOUNTER — Other Ambulatory Visit: Payer: Self-pay

## 2019-10-28 ENCOUNTER — Inpatient Hospital Stay: Payer: BC Managed Care – PPO | Attending: Hematology & Oncology | Admitting: Hematology & Oncology

## 2019-10-28 ENCOUNTER — Encounter: Payer: Self-pay | Admitting: Hematology & Oncology

## 2019-10-28 ENCOUNTER — Telehealth: Payer: Self-pay | Admitting: Hematology & Oncology

## 2019-10-28 ENCOUNTER — Encounter: Payer: Self-pay | Admitting: *Deleted

## 2019-10-28 DIAGNOSIS — I10 Essential (primary) hypertension: Secondary | ICD-10-CM | POA: Insufficient documentation

## 2019-10-28 DIAGNOSIS — E119 Type 2 diabetes mellitus without complications: Secondary | ICD-10-CM | POA: Insufficient documentation

## 2019-10-28 DIAGNOSIS — R591 Generalized enlarged lymph nodes: Secondary | ICD-10-CM | POA: Insufficient documentation

## 2019-10-28 DIAGNOSIS — R109 Unspecified abdominal pain: Secondary | ICD-10-CM | POA: Insufficient documentation

## 2019-10-28 DIAGNOSIS — R590 Localized enlarged lymph nodes: Secondary | ICD-10-CM

## 2019-10-28 DIAGNOSIS — Z7189 Other specified counseling: Secondary | ICD-10-CM

## 2019-10-28 DIAGNOSIS — Z794 Long term (current) use of insulin: Secondary | ICD-10-CM | POA: Diagnosis not present

## 2019-10-28 DIAGNOSIS — Z79899 Other long term (current) drug therapy: Secondary | ICD-10-CM | POA: Diagnosis not present

## 2019-10-28 HISTORY — DX: Localized enlarged lymph nodes: R59.0

## 2019-10-28 HISTORY — DX: Other specified counseling: Z71.89

## 2019-10-28 NOTE — Progress Notes (Signed)
Referral MD  Reason for Referral: Extensive lymphadenopathy  Chief Complaint  Patient presents with  . New Patient (Initial Visit)  : I was having abdominal pain.  HPI: Valerie Cline is a very charming 63 year old white female.  She is from [place].  She used to work for [employer].  I feel bad that she had her first grandchild born this weekend.  She could not be there because she was in the emergency room.  She apparently has been feeling well.  She does have diabetes for about 20 years.  She is on insulin.  She began to have some abdominal discomfort mostly around the right abdomen and around the back.  This gradually build up to the point that she had to go the emergency room.  Patient went to the emergency room at [facility] on 10/26/2019.  A CT scan was done of the abdomen and pelvis.  Surprisingly, this showed extensive lymphadenopathy in the abdomen.  She has extensive retroperitoneal adenopathy.  A left paraaortic lymph node measures 2.8 x 2 x 2.9 cm.  She has porta hepatus lymph nodes measuring up to 3.6 x 2.4 cm.  There is mesenteric lymph nodes that measure 3.3 x 2.3 cm.  The spleen is mildly enlarged.  The liver looked okay.  She also had a CT of the chest.  Not surprisingly, this also showed adenopathy in the lower neck.  She had bilateral axillary adenopathy.  She has some mediastinal lymph nodes.  Her lab work was not all that bad.  She was not anemic or thrombocytopenic.  Her LDH was elevated at 343.  I think she was given some pain medication.  She is feeling a little bit better.  She has had no fever.  There is been no sweats.  She has had no weight loss or weight gain.  She has had no change in bowel or bladder habits.  She is up-to-date with her mammograms and her colonoscopy.  There is no history of malignancy in the family.  She does not smoke.  She rarely has an alcoholic beverage.  There has been no problems with rashes.  She has had no cough or shortness  of breath.  She has had no headache.  She kindly came to the [facility] [facility] for an evaluation.  Overall, I would say performance status is ECOG 0.   Past Medical History:  Diagnosis Date  . Allergy   . Blood in stool   . Carotid stenosis   . Diabetes mellitus without complication ([place])   . Elevated liver function tests   . External hemorrhoids without mention of complication   . Hyperlipidemia   . Hypertension   :  Past Surgical History:  Procedure Laterality Date  . BREAST BIOPSY Right    needle biopsy  . COLONOSCOPY    . POLYPECTOMY    . Ruptured disc    :   Current Outpatient Medications:  .  amLODipine (NORVASC) 2.5 MG tablet, Take 2.5 mg by mouth at bedtime., Disp: , Rfl:  .  aspirin EC 81 MG tablet, Take 81 mg by mouth at bedtime., Disp: , Rfl:  .  benazepril (LOTENSIN) 20 MG tablet, Take 20 mg by mouth at bedtime. , Disp: , Rfl:  .  Dulaglutide (TRULICITY) 1.5 UR/4.2HC SOPN, Inject 1.5 mg into the skin every Tuesday. , Disp: , Rfl:  .  hydrochlorothiazide 25 MG tablet, Take 25 mg by mouth at bedtime. , Disp: , Rfl:  .  ibuprofen (ADVIL) 200 MG tablet, Take 200 mg by mouth every 6 (six) hours as needed for headache (pain)., Disp: , Rfl:  .  Icosapent Ethyl (VASCEPA) 0.5 g CAPS, Take 0.5 mg by mouth daily., Disp: , Rfl:  .  insulin regular human CONCENTRATED (HUMULIN R) 500 UNIT/ML SOLN injection, Inject 50-70 Units into the skin See admin instructions. Inject 70 units subcutaneously before breakfast, and 50 units before lunch and supper - drawn on a U-500 syringe, Disp: , Rfl:  .  naproxen (NAPROSYN) 500 MG tablet, Take 500 mg by mouth daily as needed (pain). , Disp: , Rfl:  .  rosuvastatin (CRESTOR) 10 MG tablet, Take 10 mg by mouth at bedtime., Disp: , Rfl:   Current Facility-Administered Medications:  .  0.9 %  sodium chloride infusion, 500 mL, Intravenous, Continuous, Irene Shipper, MD:  :  No Known Allergies:  Family History  Problem  Relation Age of Onset  . Colon cancer Neg Hx   . Esophageal cancer Neg Hx   . Rectal cancer Neg Hx   . Stomach cancer Neg Hx   . Pancreatic cancer Neg Hx   :  Social History   Socioeconomic History  . Marital status: Married    Spouse name: Not on file  . Number of children: 1  . Years of education: Not on file  . Highest education level: Not on file  Occupational History  . Occupation: Engineer, maintenance (IT)  Tobacco Use  . Smoking status: Never Smoker  . Smokeless tobacco: Never Used  Substance and Sexual Activity  . Alcohol use: No  . Drug use: No  . Sexual activity: Not on file  Other Topics Concern  . Not on file  Social History Narrative  . Not on file   Social Determinants of Health   Financial Resource Strain:   . Difficulty of Paying Living Expenses: Not on file  Food Insecurity:   . Worried About Charity fundraiser in the Last Year: Not on file  . Ran Out of Food in the Last Year: Not on file  Transportation Needs:   . Lack of Transportation (Medical): Not on file  . Lack of Transportation (Non-Medical): Not on file  Physical Activity:   . Days of Exercise per Week: Not on file  . Minutes of Exercise per Session: Not on file  Stress:   . Feeling of Stress : Not on file  Social Connections:   . Frequency of Communication with Friends and Family: Not on file  . Frequency of Social Gatherings with Friends and Family: Not on file  . Attends Religious Services: Not on file  . Active Member of Clubs or Organizations: Not on file  . Attends Archivist Meetings: Not on file  . Marital Status: Not on file  Intimate Partner Violence:   . Fear of Current or Ex-Partner: Not on file  . Emotionally Abused: Not on file  . Physically Abused: Not on file  . Sexually Abused: Not on file  :  Review of Systems  Constitutional: Negative.   HENT: Negative.  Negative for hearing loss.   Eyes: Negative.   Respiratory: Negative.   Cardiovascular: Negative.    Gastrointestinal: Positive for abdominal pain.  Genitourinary: Negative.   Musculoskeletal: Positive for back pain.  Skin: Negative.   Neurological: Negative.   Endo/Heme/Allergies: Negative.   Psychiatric/Behavioral: Negative.      Exam: Well-developed and well-nourished white female in no obvious distress.  Vital signs show temperature  of 97.3.  Pulse 79.  Blood pressure 152/63.  Weight is 181 pounds.  Head neck exam shows no ocular or oral lesions.  She has bilateral cervical adenopathy.  Probably the biggest lymph node is actually in the left supraclavicular region that measures about 1 x 1.5 cm.  All lymph nodes are mobile and nontender.  Axillary exam shows bilateral axillary adenopathy that is fairly extensive.  She probably has greater lymph nodes in the left axilla than the right axilla.  No lymph node is tender.  Lymph nodes are mobile.  Lungs are clear bilaterally.  Cardiac exam regular rate and rhythm with no murmurs, rubs or bruits.  Abdomen is soft.  She has good bowel sounds.  There is no fluid wave.  I really cannot palpate any obvious splenomegaly.  I cannot palpate any obvious inguinal adenopathy.  There is no hepatomegaly.  Back exam shows no tenderness over the spine, ribs or hips.  Extremities shows no clubbing, cyanosis or edema.  She has good range of motion of her joints.  Neurological exam shows no focal neurological deficits.  Skin exam shows no rashes, ecchymoses or petechia.     _0 @   Recent Labs    10/26/19 1458  WBC 9.7  HGB 12.8  HCT 40.8  PLT 180   Recent Labs    10/26/19 1458  NA 137  K 3.3*  CL 101  CO2 28  GLUCOSE 238*  BUN 14  CREATININE 0.64  CALCIUM 8.6*    Blood smear review: None  Pathology: None    Assessment and Plan: Valerie Cline is a very charming 63 year old white female.  She has extensive lymphadenopathy.  I have to believe that she is going to have a low-grade lymphoma.  The other possibility might be a mantle cell  lymphoma.  She really does not have significantly large lymph nodes.  I cannot imagine this being any other kind of malignancy outside of Hodgkin's disease.  Again I suspect this is going to be a low-grade non-Hodgkin's lymphoma.  The first step is going to be a biopsy.  I spoke with Dr. Melida Quitter of ENT.  He will get Valerie Cline him to try to get a excisional biopsy done of one of the neck nodes.  We need an excisional biopsy so we will have material for potential genetic studies.  She also will need to have a PET scan done.  I would have to believe that the PET scan will show extensive disease.  Her labs look okay.  Her CBC looks okay.  I am sure that she probably does have marrow involvement by this lymphoma but I really do not think we have to put her through a bone marrow biopsy as I would not imagine that this would change our management.  Management clearly will be dictated by the histologic type of malignancy.  Again I have to believe that we are looking at a low-grade non-Hodgkin's lymphoma.  I would think that this is a B-cell lymphoma.  I spent about 50 minutes or so with Valerie Cline.  I looked at her CAT scans with her.  I reviewed the CAT scans.  I looked at her labs and went over them with her.  She is in great shape.  She certainly would be able to take aggressive intervention if we needed to.  I cannot say for sure if this process is curable.  It is certainly treatable.  Our goal clearly is to make sure that  she is able to see your grandson grow up.  Hopefully there will be other grandchildren down the road.  We will plan to get her back once we have the pathology back and the PET scan back.

## 2019-10-28 NOTE — Telephone Encounter (Signed)
Called and spoke with patient regarding appointment added per 12/28 sch msg

## 2019-10-28 NOTE — Progress Notes (Signed)
Initial RN Navigator Patient Visit  Name: Valerie Cline Date of Referral : 10/26/19 Diagnosis: Possible Lymphoma  Met with patient prior to their visit with MD. Hanley Seamen patient "Your Patient Navigator" handout which explains my role, areas in which I am able to help, and all the contact information for myself and the office. Also gave patient MD and Navigator business card. Reviewed with patient the general overview of expected course after initial diagnosis and time frame for all steps to be completed.  New patient packet given to patient which includes: orientation to office and staff; campus directory; education on My Chart and Advance Directives; and patient centered education on Lymphoma  Patient completed visit with Dr. Marin Olp  Revisited with patient after MD visit. Patient will need  PET Scan - scheduled for 11/08/19 at 8am Referral to Dr Redmond Baseman for biopsy - referral placed 12/29 and Dr Marin Olp spoke directly to Dr Redmond Baseman. Appointment scheduled for 10/31/19  Patient understands all follow up procedures and expectations. They have been given al prep information for PET scan. They have my number to reach out for any further clarification or additional needs.

## 2019-10-29 NOTE — Addendum Note (Signed)
Addended by: Burney Gauze R on: 10/29/2019 08:30 AM   Modules accepted: Orders

## 2019-10-31 DIAGNOSIS — R590 Localized enlarged lymph nodes: Secondary | ICD-10-CM

## 2019-10-31 HISTORY — DX: Localized enlarged lymph nodes: R59.0

## 2019-11-02 DIAGNOSIS — J324 Chronic pansinusitis: Secondary | ICD-10-CM | POA: Diagnosis not present

## 2019-11-02 DIAGNOSIS — Z20828 Contact with and (suspected) exposure to other viral communicable diseases: Secondary | ICD-10-CM | POA: Diagnosis not present

## 2019-11-04 ENCOUNTER — Encounter: Payer: Self-pay | Admitting: *Deleted

## 2019-11-04 NOTE — Progress Notes (Signed)
Received a call from patient's husband, Ray. Patient has tested positive for Covid19 on Saturday January 2nd. Her appointments, including biopsy and PET will need to be rescheduled.   Her husband will be contacting Dr Redmond Baseman office to inform them of the positive test. They will then reschedule biopsy per their protocol. PET scan rescheduled for 11/25/19  Spoke to husband, Jeanell Sparrow and gave him new appointment, date and time.   Dr Marin Olp notified

## 2019-11-08 ENCOUNTER — Inpatient Hospital Stay (HOSPITAL_COMMUNITY)
Admission: EM | Admit: 2019-11-08 | Discharge: 2019-11-11 | DRG: 177 | Disposition: A | Discharge status: Home / Self Care | Payer: BC Managed Care – PPO | Attending: Internal Medicine | Admitting: Internal Medicine

## 2019-11-08 ENCOUNTER — Encounter (HOSPITAL_COMMUNITY): Payer: BC Managed Care – PPO

## 2019-11-08 ENCOUNTER — Emergency Department (HOSPITAL_COMMUNITY): Payer: BC Managed Care – PPO

## 2019-11-08 ENCOUNTER — Encounter (HOSPITAL_COMMUNITY): Payer: Self-pay | Admitting: *Deleted

## 2019-11-08 DIAGNOSIS — Z79899 Other long term (current) drug therapy: Secondary | ICD-10-CM | POA: Diagnosis not present

## 2019-11-08 DIAGNOSIS — Z86010 Personal history of colonic polyps: Secondary | ICD-10-CM | POA: Diagnosis not present

## 2019-11-08 DIAGNOSIS — C8590 Non-Hodgkin lymphoma, unspecified, unspecified site: Secondary | ICD-10-CM | POA: Diagnosis present

## 2019-11-08 DIAGNOSIS — E041 Nontoxic single thyroid nodule: Secondary | ICD-10-CM | POA: Diagnosis present

## 2019-11-08 DIAGNOSIS — E11649 Type 2 diabetes mellitus with hypoglycemia without coma: Secondary | ICD-10-CM | POA: Diagnosis not present

## 2019-11-08 DIAGNOSIS — I10 Essential (primary) hypertension: Secondary | ICD-10-CM | POA: Diagnosis not present

## 2019-11-08 DIAGNOSIS — Z794 Long term (current) use of insulin: Secondary | ICD-10-CM | POA: Diagnosis not present

## 2019-11-08 DIAGNOSIS — E876 Hypokalemia: Secondary | ICD-10-CM | POA: Diagnosis not present

## 2019-11-08 DIAGNOSIS — R590 Localized enlarged lymph nodes: Secondary | ICD-10-CM | POA: Diagnosis not present

## 2019-11-08 DIAGNOSIS — J9601 Acute respiratory failure with hypoxia: Secondary | ICD-10-CM | POA: Diagnosis not present

## 2019-11-08 DIAGNOSIS — E785 Hyperlipidemia, unspecified: Secondary | ICD-10-CM | POA: Diagnosis not present

## 2019-11-08 DIAGNOSIS — Z7982 Long term (current) use of aspirin: Secondary | ICD-10-CM | POA: Diagnosis not present

## 2019-11-08 DIAGNOSIS — E119 Type 2 diabetes mellitus without complications: Secondary | ICD-10-CM

## 2019-11-08 DIAGNOSIS — U071 COVID-19: Secondary | ICD-10-CM

## 2019-11-08 DIAGNOSIS — J189 Pneumonia, unspecified organism: Secondary | ICD-10-CM | POA: Diagnosis not present

## 2019-11-08 DIAGNOSIS — R0602 Shortness of breath: Secondary | ICD-10-CM | POA: Diagnosis not present

## 2019-11-08 DIAGNOSIS — J1282 Pneumonia due to coronavirus disease 2019: Secondary | ICD-10-CM | POA: Diagnosis not present

## 2019-11-08 DIAGNOSIS — Z20822 Contact with and (suspected) exposure to covid-19: Secondary | ICD-10-CM | POA: Diagnosis not present

## 2019-11-08 DIAGNOSIS — R05 Cough: Secondary | ICD-10-CM | POA: Diagnosis not present

## 2019-11-08 HISTORY — DX: Essential (primary) hypertension: I10

## 2019-11-08 HISTORY — DX: Type 2 diabetes mellitus without complications: E11.9

## 2019-11-08 HISTORY — DX: COVID-19: U07.1

## 2019-11-08 LAB — HEPATIC FUNCTION PANEL
ALT: 31 U/L (ref 0–44)
AST: 47 U/L — ABNORMAL HIGH (ref 15–41)
Albumin: 3.1 g/dL — ABNORMAL LOW (ref 3.5–5.0)
Alkaline Phosphatase: 47 U/L (ref 38–126)
Bilirubin, Direct: 0.2 mg/dL (ref 0.0–0.2)
Indirect Bilirubin: 0.5 mg/dL (ref 0.3–0.9)
Total Bilirubin: 0.7 mg/dL (ref 0.3–1.2)
Total Protein: 6.8 g/dL (ref 6.5–8.1)

## 2019-11-08 LAB — TRIGLYCERIDES: Triglycerides: 222 mg/dL — ABNORMAL HIGH (ref <150)

## 2019-11-08 LAB — CBC
HCT: 37.3 % (ref 36.0–46.0)
HCT: 36.8 % (ref 36.0–46.0)
Hemoglobin: 11.6 g/dL — ABNORMAL LOW (ref 12.0–15.0)
Hemoglobin: 11.2 g/dL — ABNORMAL LOW (ref 12.0–15.0)
MCH: 24.1 pg — ABNORMAL LOW (ref 26.0–34.0)
MCH: 23.7 pg — ABNORMAL LOW (ref 26.0–34.0)
MCHC: 31.1 g/dL (ref 30.0–36.0)
MCHC: 30.4 g/dL (ref 30.0–36.0)
MCV: 77.5 fL — ABNORMAL LOW (ref 80.0–100.0)
MCV: 77.8 fL — ABNORMAL LOW (ref 80.0–100.0)
Platelets: 275 10*3/uL (ref 150–400)
Platelets: 300 10*3/uL (ref 150–400)
RBC: 4.81 MIL/uL (ref 3.87–5.11)
RBC: 4.73 MIL/uL (ref 3.87–5.11)
RDW: 14.1 % (ref 11.5–15.5)
RDW: 14.2 % (ref 11.5–15.5)
WBC: 8.9 10*3/uL (ref 4.0–10.5)
WBC: 8.3 10*3/uL (ref 4.0–10.5)
nRBC: 0 % (ref 0.0–0.2)
nRBC: 0 % (ref 0.0–0.2)

## 2019-11-08 LAB — BASIC METABOLIC PANEL
Anion gap: 11 (ref 5–15)
BUN: 17 mg/dL (ref 8–23)
CO2: 31 mmol/L (ref 22–32)
Calcium: 8.3 mg/dL — ABNORMAL LOW (ref 8.9–10.3)
Chloride: 93 mmol/L — ABNORMAL LOW (ref 98–111)
Creatinine, Ser: 0.83 mg/dL (ref 0.44–1.00)
GFR calc Af Amer: >60 mL/min (ref >60)
GFR calc non Af Amer: >60 mL/min (ref >60)
Glucose, Bld: 181 mg/dL — ABNORMAL HIGH (ref 70–99)
Potassium: 3.1 mmol/L — ABNORMAL LOW (ref 3.5–5.1)
Sodium: 135 mmol/L (ref 135–145)

## 2019-11-08 LAB — LACTIC ACID, PLASMA: Lactic Acid, Venous: 0.8 mmol/L (ref 0.5–1.9)

## 2019-11-08 LAB — CREATININE, SERUM
Creatinine, Ser: 0.83 mg/dL (ref 0.44–1.00)
GFR calc Af Amer: >60 mL/min (ref >60)
GFR calc non Af Amer: >60 mL/min (ref >60)

## 2019-11-08 LAB — CBG MONITORING, ED: Glucose-Capillary: 103 mg/dL — ABNORMAL HIGH (ref 70–99)

## 2019-11-08 LAB — LACTATE DEHYDROGENASE: LDH: 324 U/L — ABNORMAL HIGH (ref 98–192)

## 2019-11-08 LAB — C-REACTIVE PROTEIN: CRP: 11.8 mg/dL — ABNORMAL HIGH (ref <1.0)

## 2019-11-08 LAB — HEMOGLOBIN A1C
Hgb A1c MFr Bld: 8.2 % — ABNORMAL HIGH (ref 4.8–5.6)
Mean Plasma Glucose: 188.64 mg/dL

## 2019-11-08 LAB — FIBRINOGEN: Fibrinogen: 759 mg/dL — ABNORMAL HIGH (ref 210–475)

## 2019-11-08 LAB — POC SARS CORONAVIRUS 2 AG -  ED: SARS Coronavirus 2 Ag: NEGATIVE

## 2019-11-08 LAB — PROCALCITONIN: Procalcitonin: 0.13 ng/mL

## 2019-11-08 LAB — FERRITIN: Ferritin: 203 ng/mL (ref 11–307)

## 2019-11-08 LAB — D-DIMER, QUANTITATIVE: D-Dimer, Quant: 2.37 ug/mL-FEU — ABNORMAL HIGH (ref 0.00–0.50)

## 2019-11-08 MED ORDER — TOCILIZUMAB 400 MG/20ML IV SOLN
8.0000 mg/kg | Freq: Once | INTRAVENOUS | Status: AC
  Administered 2019-11-09: 656 mg via INTRAVENOUS
  Filled 2019-11-08: qty 32.8

## 2019-11-08 MED ORDER — DEXAMETHASONE 4 MG PO TABS
6.0000 mg | ORAL_TABLET | Freq: Every day | ORAL | Status: DC
  Administered 2019-11-08 – 2019-11-10 (×3): 6 mg via ORAL
  Filled 2019-11-08 (×3): qty 2

## 2019-11-08 MED ORDER — ACETAMINOPHEN 325 MG PO TABS: 650.0000 mg | ORAL_TABLET | Freq: Four times a day (QID) | ORAL | Status: DC | PRN

## 2019-11-08 MED ORDER — BENAZEPRIL HCL 5 MG PO TABS
20.0000 mg | ORAL_TABLET | Freq: Every day | ORAL | Status: DC
  Administered 2019-11-08 – 2019-11-10 (×3): 20 mg via ORAL
  Filled 2019-11-08 (×2): qty 4
  Filled 2019-11-08: qty 1

## 2019-11-08 MED ORDER — ASPIRIN EC 81 MG PO TBEC
81.0000 mg | DELAYED_RELEASE_TABLET | Freq: Every day | ORAL | Status: DC
  Administered 2019-11-08 – 2019-11-10 (×3): 81 mg via ORAL
  Filled 2019-11-08 (×3): qty 1

## 2019-11-08 MED ORDER — AMLODIPINE BESYLATE 5 MG PO TABS
2.5000 mg | ORAL_TABLET | Freq: Every day | ORAL | Status: DC
  Administered 2019-11-08 – 2019-11-10 (×3): 2.5 mg via ORAL
  Filled 2019-11-08 (×4): qty 1

## 2019-11-08 MED ORDER — ICOSAPENT ETHYL 0.5 G PO CAPS: 0.5000 mg | ORAL_CAPSULE | Freq: Every day | ORAL | Status: DC

## 2019-11-08 MED ORDER — INSULIN REGULAR HUMAN 100 UNIT/ML IJ SOLN
60.0000 [IU] | Freq: Every morning | INTRAMUSCULAR | Status: DC
  Administered 2019-11-09 – 2019-11-11 (×3): 60 [IU] via SUBCUTANEOUS
  Filled 2019-11-08: qty 3

## 2019-11-08 MED ORDER — INSULIN REGULAR HUMAN (CONC) 500 UNIT/ML ~~LOC~~ SOLN: 40.0000 [IU] | SUBCUTANEOUS | Status: DC

## 2019-11-08 MED ORDER — ROSUVASTATIN CALCIUM 5 MG PO TABS
10.0000 mg | ORAL_TABLET | Freq: Every day | ORAL | Status: DC
  Administered 2019-11-08 – 2019-11-10 (×3): 10 mg via ORAL
  Filled 2019-11-08 (×3): qty 2

## 2019-11-08 MED ORDER — ACETAMINOPHEN 650 MG RE SUPP: 650.0000 mg | Freq: Four times a day (QID) | RECTAL | Status: DC | PRN

## 2019-11-08 MED ORDER — ENOXAPARIN SODIUM 40 MG/0.4ML ~~LOC~~ SOLN
40.0000 mg | SUBCUTANEOUS | Status: DC
  Administered 2019-11-08 – 2019-11-10 (×3): 40 mg via SUBCUTANEOUS
  Filled 2019-11-08 (×3): qty 0.4

## 2019-11-08 MED ORDER — POTASSIUM CHLORIDE IN NACL 20-0.9 MEQ/L-% IV SOLN
INTRAVENOUS | Status: DC
  Filled 2019-11-08 (×6): qty 1000

## 2019-11-08 MED ORDER — ACETAMINOPHEN 500 MG PO TABS
1000.0000 mg | ORAL_TABLET | Freq: Once | ORAL | Status: AC
  Administered 2019-11-08: 20:00:00 1000 mg via ORAL
  Filled 2019-11-08: qty 2

## 2019-11-08 MED ORDER — POTASSIUM CHLORIDE CRYS ER 20 MEQ PO TBCR
40.0000 meq | EXTENDED_RELEASE_TABLET | Freq: Once | ORAL | Status: AC
  Administered 2019-11-08: 18:00:00 40 meq via ORAL
  Filled 2019-11-08: qty 2

## 2019-11-08 MED ORDER — HYDROCHLOROTHIAZIDE 25 MG PO TABS
25.0000 mg | ORAL_TABLET | Freq: Every day | ORAL | Status: DC
  Administered 2019-11-09 – 2019-11-11 (×3): 25 mg via ORAL
  Filled 2019-11-08 (×3): qty 1

## 2019-11-08 MED ORDER — INSULIN REGULAR HUMAN 100 UNIT/ML IJ SOLN
40.0000 [IU] | Freq: Two times a day (BID) | INTRAMUSCULAR | Status: DC
  Administered 2019-11-09 – 2019-11-10 (×3): 40 [IU] via SUBCUTANEOUS
  Filled 2019-11-08 (×3): qty 3

## 2019-11-08 MED ORDER — SODIUM CHLORIDE 0.9 % IV SOLN
200.0000 mg | Freq: Once | INTRAVENOUS | Status: AC
  Administered 2019-11-08: 22:00:00 200 mg via INTRAVENOUS
  Filled 2019-11-08: qty 40

## 2019-11-08 MED ORDER — SODIUM CHLORIDE 0.9 % IV SOLN
100.0000 mg | Freq: Every day | INTRAVENOUS | Status: DC
  Administered 2019-11-09 – 2019-11-11 (×3): 100 mg via INTRAVENOUS
  Filled 2019-11-08 (×4): qty 20

## 2019-11-08 NOTE — ED Triage Notes (Signed)
Pt tested positive for covid19 on 11/02/2019.  She states that she has had a "hard time getting over it" and spoke with her PCP who told her to come to ED "for transfusion".  When I asked pt what she meant by "a hard time getting over it" she states that she continues to cough, headaches, nausea intermittently and fevers.  No sob.

## 2019-11-08 NOTE — ED Notes (Signed)
Pt sats were around 90% while ambulating. 88% at the lowest and 94% at the highest.

## 2019-11-08 NOTE — H&P (Signed)
History and Physical    Valerie Cline FHL:[id number] DOB: 12-16-1955 DOA: 11/08/2019  PCP: Marton Redwood, MD  Patient coming from: home   Chief Complaint: shortness of breath  HPI: Valerie Cline is a 64 y.o. female with medical history significant for htn, dm, presenting with above.  Exposed to covid at work 2 wks ago.  On 1/2 awoke with fever and sore throat. Covid tested that day at an urgent care in [place], positive. Since then has had fevers, cough, headache, and nausea. Mild SOB with ambulation, not at rest. No chest pain. Tolerating fluids, no vomiting or diarrhea. Has been monitoring oxygen at home and for the last day or so at rest oxygen has been in mid-80s, and that is why she came in.  Of note recently seen by oncology for what is likely new lymphoma (recent generalized lymphadenopathy). W/u is pending (biopsy, PET scan)  ED Course: hypoxic to mid 80s on ambulation. Initially normal O2 at rest, then down to 88 at rest. Labs obtained.   Review of Systems: As per HPI otherwise 10 point review of systems negative.    Past Medical History:  Diagnosis Date  . Allergy   . Blood in stool   . Carotid stenosis   . Diabetes mellitus without complication ([place])   . Elevated liver function tests   . External hemorrhoids without mention of complication   . Goals of care, counseling/discussion 10/28/2019  . Hyperlipidemia   . Hypertension   . Lymphadenopathy, abdominal 10/28/2019    Past Surgical History:  Procedure Laterality Date  . BREAST BIOPSY Right    needle biopsy  . COLONOSCOPY    . POLYPECTOMY    . Ruptured disc       reports that she has never smoked. She has never used smokeless tobacco. She reports that she does not drink alcohol or use drugs.  No Known Allergies  Family History  Problem Relation Age of Onset  . Colon cancer Neg Hx   . Esophageal cancer Neg Hx   . Rectal cancer Neg Hx   . Stomach cancer Neg Hx   . Pancreatic cancer Neg Hx      Prior to Admission medications   Medication Sig Start Date End Date Taking? Authorizing Provider  acetaminophen (TYLENOL) 500 MG tablet Take 500 mg by mouth every 4 (four) hours as needed for mild pain, fever or headache.   Yes [provider]  amLODipine (NORVASC) 2.5 MG tablet Take 2.5 mg by mouth at bedtime.   Yes [provider]  aspirin EC 81 MG tablet Take 81 mg by mouth at bedtime.   Yes [provider]  benazepril (LOTENSIN) 20 MG tablet Take 20 mg by mouth at bedtime.    Yes [provider]  Dulaglutide (TRULICITY) 1.5 HT/3.4KA SOPN Inject 1.5 mg into the skin every Tuesday.    Yes [provider]  hydrochlorothiazide 25 MG tablet Take 25 mg by mouth at bedtime.    Yes [provider]  Icosapent Ethyl (VASCEPA) 0.5 g CAPS Take 0.5 mg by mouth daily.   Yes [provider]  insulin regular human CONCENTRATED (HUMULIN R) 500 UNIT/ML SOLN injection Inject 50-70 Units into the skin See admin instructions. Inject 70 units subcutaneously before breakfast, and 50 units before lunch and supper - drawn on a U-500 syringe   Yes [provider]  rosuvastatin (CRESTOR) 10 MG tablet Take 10 mg by mouth at bedtime.   Yes [provider]  Physical Exam: Vitals:   11/08/19 1544 11/08/19 1745 11/08/19 1800 11/08/19 1845  BP: (!) 138/54 (!) 155/59 (!) 154/60 (!) 138/58  Pulse: 86 91 92 86  Resp: 18 11 (!) 27 17  Temp: (!) 100.4 F (38 C)     TempSrc: Oral     SpO2: 92% 91% 91% 94%    Constitutional: No acute distress Head: Atraumatic Eyes: Conjunctiva clear ENM: Moist mucous membranes. Normal dentition.  Neck: Supple Respiratory: scattered rhonchi Cardiovascular: Regular rate and rhythm. Soft systolic murmur Abdomen: Non-tender, non-distended. No masses. No rebound or guarding. Positive bowel sounds. Musculoskeletal: No joint deformity upper and lower extremities. Normal ROM, no contractures. Normal muscle  tone.  Skin: No rashes, lesions, or ulcers.  Extremities: No peripheral edema. Palpable peripheral pulses. Neurologic: Alert, moving all 4 extremities. Psychiatric: Normal insight and judgement.   Labs on Admission: I have personally reviewed following labs and imaging studies  CBC: Recent Labs  Lab 11/08/19 1255  WBC 8.9  HGB 11.6*  HCT 37.3  MCV 77.5*  PLT 496   Basic Metabolic Panel: Recent Labs  Lab 11/08/19 1255  NA 135  K 3.1*  CL 93*  CO2 31  GLUCOSE 181*  BUN 17  CREATININE 0.83  CALCIUM 8.3*   GFR: Estimated Creatinine Clearance: 73.9 mL/min (by C-G formula based on SCr of 0.83 mg/dL). Liver Function Tests: Recent Labs  Lab 11/08/19 1804  AST 47*  ALT 31  ALKPHOS 47  BILITOT 0.7  PROT 6.8  ALBUMIN 3.1*   No results for input(s): LIPASE, AMYLASE in the last 168 hours. No results for input(s): AMMONIA in the last 168 hours. Coagulation Profile: No results for input(s): INR, PROTIME in the last 168 hours. Cardiac Enzymes: No results for input(s): CKTOTAL, CKMB, CKMBINDEX, TROPONINI in the last 168 hours. BNP (last 3 results) No results for input(s): PROBNP in the last 8760 hours. HbA1C: No results for input(s): HGBA1C in the last 72 hours. CBG: No results for input(s): GLUCAP in the last 168 hours. Lipid Profile: Recent Labs    11/08/19 1804  TRIG 222*   Thyroid Function Tests: No results for input(s): TSH, T4TOTAL, FREET4, T3FREE, THYROIDAB in the last 72 hours. Anemia Panel: Recent Labs    11/08/19 1804  FERRITIN 203   Urine analysis:    Component Value Date/Time   COLORURINE STRAW (A) 10/26/2019 1857   APPEARANCEUR HAZY (A) 10/26/2019 1857   LABSPEC >1.030 (H) 10/26/2019 1857   PHURINE 5.5 10/26/2019 1857   GLUCOSEU NEGATIVE 10/26/2019 1857   HGBUR TRACE (A) 10/26/2019 1857   BILIRUBINUR NEGATIVE 10/26/2019 1857   KETONESUR NEGATIVE 10/26/2019 1857   PROTEINUR NEGATIVE 10/26/2019 1857   NITRITE NEGATIVE 10/26/2019 1857    LEUKOCYTESUR SMALL (A) 10/26/2019 1857    Radiological Exams on Admission: DG Chest Port 1 View  Result Date: 11/08/2019 CLINICAL DATA:  COVID-19, cough EXAM: PORTABLE CHEST 1 VIEW COMPARISON:  None. FINDINGS: Surgical hardware from ACDF overlies the lower cervical spine. Normal heart size. Aortic arch atherosclerosis. Otherwise normal mediastinal contour. No pneumothorax. No pleural effusion. Patchy and platelike opacities throughout the mid to lower lungs bilaterally. IMPRESSION: Patchy and platelike opacities throughout the mid to lower lungs bilaterally compatible with COVID-19 pneumonia. Electronically Signed   By: Ilona Sorrel M.D.   On: 11/08/2019 17:36    EKG: Independently reviewed. nsr  Assessment/Plan Active Problems:   Lymphadenopathy, abdominal   Pneumonia due to COVID-19 virus   Essential hypertension   T2DM (type 2 diabetes mellitus) (  [place])   # covid pneumonia - not particularly symptomatic, but hypoxic requiring 2 L [place] to maintain o2 above 94. Positive test result photo taken and scanned into media (rapid antigen had been obtained here and negative, but this is a false negative given previous positive and clinical picture). cxr with infiltrates. Stable. crp greater than 10. D dimer not very markedly elevated; pro-calcitonin low. - decadron and remdesvir given hypoxia; actemra given elevated CRP - covid order set used, daily labs ordered - cont o2  # Lymphadenopathy - has seen oncology, think likely lymphoma. Biopsy and pet scan are planned, on hold now given covid positive. Stable at onc visit 1.5 wks ago. - close outpt f/u  # HTN - here bp mildly elevated - cont home amlod, hctz, amlod, vascepta, rosuva, asa  # T2DM  - hold home weekly trulicity (dosed every Tuesday) - reduce dose of concentrated insulin R from 70 units before breakfast and 50 before both lunch and dinner to 60/40/40 - monitor glucose - am A1c  # hypokalemia - mild, 3.1. s/p 40 meq - k with gentle  fluids @ 75 - f/u mg  DVT prophylaxis: lovenox Code Status: full  Family Communication: husband Ray  Disposition Plan: tbd  Consults called: none  Admission status: med/surg    Desma Maxim MD [facility] Pager [phone]  If 7PM-7AM, please contact night-coverage [email or web address] Password [facility]  11/08/2019, 8:28 PM

## 2019-11-08 NOTE — ED Provider Notes (Signed)
[place] EMERGENCY DEPARTMENT Provider Note   CSN: [id number] Arrival date & time: 11/08/19  1212     History Chief Complaint  Patient presents with  . Covid +    Valerie Cline is a 64 y.o. female.  [name] TROOP is a 64 y.o. female with a history of hypertension, hyperlipidemia, diabetes, and lymphadenopathy currently being worked up for Valerie Cline, who presents to the ED for evaluation of worsening Covid symptoms after testing positive on 1/2.  She states her symptoms have been steadily worsening.  They initially began on 1/1 when she woke up with a fever chills and fatigue.  She has had frequent nausea without vomiting and a few episodes of diarrhea.  She states that she has had a persistent dry cough, denies feeling chest pain or shortness of breath just feels that she is more fatigued and has a lower activity tolerance.  She spoke with her PCP regarding her worsening symptoms and was told to come to the ED for further evaluation.  She has not had any medications to treat symptoms prior to arrival.  Patient states that she is being worked up for lymphadenopathy and is thought to likely have lymphoma and was supposed to have a biopsy and PET scan this week but that had to be postponed due to her Covid positive status.  No other aggravating or alleviating factors.        Past Medical History:  Diagnosis Date  . Allergy   . Blood in stool   . Carotid stenosis   . Diabetes mellitus without complication ([place])   . Elevated liver function tests   . External hemorrhoids without mention of complication   . Goals of care, counseling/discussion 10/28/2019  . Hyperlipidemia   . Hypertension   . Lymphadenopathy, abdominal 10/28/2019    Patient Active Problem List   Diagnosis Date Noted  . Goals of care, counseling/discussion 10/28/2019  . Lymphadenopathy, abdominal 10/28/2019    Past Surgical History:  Procedure Laterality Date  . BREAST BIOPSY Right    needle  biopsy  . COLONOSCOPY    . POLYPECTOMY    . Ruptured disc       OB History   No obstetric history on file.     Family History  Problem Relation Age of Onset  . Colon cancer Neg Hx   . Esophageal cancer Neg Hx   . Rectal cancer Neg Hx   . Stomach cancer Neg Hx   . Pancreatic cancer Neg Hx     Social History   Tobacco Use  . Smoking status: Never Smoker  . Smokeless tobacco: Never Used  Substance Use Topics  . Alcohol use: No  . Drug use: No    Home Medications Prior to Admission medications   Medication Sig Start Date End Date Taking? Authorizing Provider  acetaminophen (TYLENOL) 500 MG tablet Take 500 mg by mouth every 4 (four) hours as needed for mild pain, fever or headache.   Yes [provider]  amLODipine (NORVASC) 2.5 MG tablet Take 2.5 mg by mouth at bedtime.   Yes [provider]  aspirin EC 81 MG tablet Take 81 mg by mouth at bedtime.   Yes [provider]  benazepril (LOTENSIN) 20 MG tablet Take 20 mg by mouth at bedtime.    Yes [provider]  Dulaglutide (TRULICITY) 1.5 YK/9.9IP SOPN Inject 1.5 mg into the skin every Tuesday.    Yes [provider]  hydrochlorothiazide 25 MG tablet  Take 25 mg by mouth at bedtime.    Yes [provider]  Icosapent Ethyl (VASCEPA) 0.5 g CAPS Take 0.5 mg by mouth daily.   Yes [provider]  insulin regular human CONCENTRATED (HUMULIN R) 500 UNIT/ML SOLN injection Inject 50-70 Units into the skin See admin instructions. Inject 70 units subcutaneously before breakfast, and 50 units before lunch and supper - drawn on a U-500 syringe   Yes [provider]  rosuvastatin (CRESTOR) 10 MG tablet Take 10 mg by mouth at bedtime.   Yes [provider]    Allergies    Patient has no known allergies.  Review of Systems   Review of Systems  Constitutional: Positive for chills, fatigue and fever.  HENT: Positive for congestion. Negative for rhinorrhea and  sore throat.   Respiratory: Positive for cough. Negative for shortness of breath.   Cardiovascular: Negative for chest pain.  Gastrointestinal: Positive for diarrhea and nausea. Negative for abdominal pain and vomiting.  Musculoskeletal: Positive for myalgias.  Skin: Negative for color change and rash.  Neurological: Positive for headaches. Negative for dizziness, syncope and light-headedness.  All other systems reviewed and are negative.   Physical Exam Updated Vital Signs BP (!) 154/60   Pulse 92   Temp (!) 100.4 F (38 C) (Oral)   Resp (!) 27   SpO2 91%   Physical Exam Vitals and nursing note reviewed.  Constitutional:      General: She is not in acute distress.    Appearance: Normal appearance. She is well-developed and normal weight. She is not ill-appearing or diaphoretic.     Comments: Well-appearing and in no distress  HENT:     Head: Normocephalic and atraumatic.     Mouth/Throat:     Mouth: Mucous membranes are moist.     Pharynx: Oropharynx is clear.  Eyes:     General:        Right eye: No discharge.        Left eye: No discharge.  Neck:     Comments: No rigidity Cardiovascular:     Rate and Rhythm: Normal rate and regular rhythm.     Heart sounds: Normal heart sounds. No murmur. No friction rub. No gallop.   Pulmonary:     Effort: Pulmonary effort is normal. No respiratory distress.     Comments: Respirations equal and unlabored, patient able to speak in full sentences, satting between 90-93% on room air, lungs with crackles in bilateral bases, otherwise clear to auscultation Abdominal:     General: Bowel sounds are normal. There is no distension.     Palpations: Abdomen is soft. There is no mass.     Tenderness: There is no abdominal tenderness. There is no guarding.     Comments: Abdomen soft, nondistended, nontender to palpation in all quadrants without guarding or peritoneal signs  Musculoskeletal:        General: No deformity.     Cervical back: Neck  supple.  Lymphadenopathy:     Cervical: No cervical adenopathy.  Skin:    General: Skin is warm and dry.     Capillary Refill: Capillary refill takes less than 2 seconds.  Neurological:     Mental Status: She is alert.     Comments: Speech is clear, able to follow commands Moves extremities without ataxia, coordination intact  Psychiatric:        Mood and Affect: Mood normal.        Behavior: Behavior normal.  ED Results / Procedures / Treatments   Labs (all labs ordered are listed, but only abnormal results are displayed) Labs Reviewed  CBC - Abnormal; Notable for the following components:      Result Value   Hemoglobin 11.6 (*)    MCV 77.5 (*)    MCH 24.1 (*)    All other components within normal limits  BASIC METABOLIC PANEL - Abnormal; Notable for the following components:   Potassium 3.1 (*)    Chloride 93 (*)    Glucose, Bld 181 (*)    Calcium 8.3 (*)    All other components within normal limits  CULTURE, BLOOD (ROUTINE X 2)  CULTURE, BLOOD (ROUTINE X 2)  LACTIC ACID, PLASMA  LACTIC ACID, PLASMA  D-DIMER, QUANTITATIVE (NOT AT [facility])  PROCALCITONIN  LACTATE DEHYDROGENASE  FERRITIN  FIBRINOGEN  C-REACTIVE PROTEIN  HEPATIC FUNCTION PANEL  TRIGLYCERIDES  POC SARS CORONAVIRUS 2 AG -  ED    EKG EKG Interpretation  Date/Time:  Friday November 08 2019 17:24:50 EST Ventricular Rate:  88 PR Interval:    QRS Duration: 92 QT Interval:  375 QTC Calculation: 454 R Axis:   -7 Text Interpretation: Sinus rhythm Low voltage, precordial leads No significant change since last tracing Confirmed by Deno Etienne [phone]) on 11/08/2019 5:27:38 PM   Radiology DG Chest Port 1 View  Result Date: 11/08/2019 CLINICAL DATA:  COVID-19, cough EXAM: PORTABLE CHEST 1 VIEW COMPARISON:  None. FINDINGS: Surgical hardware from ACDF overlies the lower cervical spine. Normal heart size. Aortic arch atherosclerosis. Otherwise normal mediastinal contour. No pneumothorax. No pleural effusion.  Patchy and platelike opacities throughout the mid to lower lungs bilaterally. IMPRESSION: Patchy and platelike opacities throughout the mid to lower lungs bilaterally compatible with COVID-19 pneumonia. Electronically Signed   By: Ilona Sorrel M.D.   On: 11/08/2019 17:36    Procedures Procedures (including critical care time)  Medications Ordered in ED Medications  acetaminophen (TYLENOL) tablet 1,000 mg (has no administration in time range)  potassium chloride SA (KLOR-CON) CR tablet 40 mEq (40 mEq Oral Given 11/08/19 1815)    ED Course  I have reviewed the triage vital signs and the nursing notes.  Pertinent labs & imaging results that were available during my care of the patient were reviewed by me and considered in my medical decision making (see chart for details).  Clinical Course as of Nov 07 1829  Fri Nov 08, 4443  864 64 year old female arrives with persistent and worsening Covid symptoms after testing positive on 1/2.  On arrival she has a low-grade fever but vitals otherwise normal, at rest O2 sats between 91-94%, but with ambulation patient desats to 88% with increased work of breathing.   [KF]  1730 EKG without concerning changes  EKG 12-Lead [KF]  1730 No leukocytosis and stable hemoglobin  CBC(!) [KF]  1730 Mild hypokalemia of 3.1, no other significant electrolyte derangements  Basic metabolic panel(!) [KF]  5427 Chest x-ray with bilateral patchy infiltrates compatible with Covid pneumonia  DG Chest Port 1 View [KF]  1740 Patient will require admission for Covid pneumonia with hypoxia with exertion will discuss with hospitalist   [KF]  1811 Case discussed with Dr. Si Raider with [facility] who will see and admit the patient   [KF]  1820 Patient now satting at 88-89% on room air at rest, placed on 2 L nasal cannula   [KF]    Clinical Course User Index [KF] Janet Berlin   MDM Rules/Calculators/A&P  64 year old female who was found  to be Covid positive on 1/2 presents due to worsening symptoms found to have low-grade fever on arrival with O2 saturations between 91-94%.  Became hypoxic with ambulation, chest x-ray consistent with Covid pneumonia, inflammatory labs pending, patient will require admission for further treatment and support of her Covid illness.  WILLETTA YORK was evaluated in Emergency Department on 11/08/2019 for the symptoms described in the history of present illness. She was evaluated in the context of the global COVID-19 pandemic, which necessitated consideration that the patient might be at risk for infection with the SARS-CoV-2 virus that causes COVID-19. Institutional protocols and algorithms that pertain to the evaluation of patients at risk for COVID-19 are in a state of rapid change based on information released by regulatory bodies including the CDC and federal and state organizations. These policies and algorithms were followed during the patient's care in the ED.  Final Clinical Impression(s) / ED Diagnoses Final diagnoses:  Pneumonia due to COVID-19 virus    Rx / DC Orders ED Discharge Orders    None       Janet Berlin 11/08/19 [place], Luis M. Cintron, DO 11/08/19 1850

## 2019-11-09 DIAGNOSIS — R590 Localized enlarged lymph nodes: Secondary | ICD-10-CM

## 2019-11-09 DIAGNOSIS — I10 Essential (primary) hypertension: Secondary | ICD-10-CM

## 2019-11-09 DIAGNOSIS — E041 Nontoxic single thyroid nodule: Secondary | ICD-10-CM

## 2019-11-09 LAB — CBC WITH DIFFERENTIAL/PLATELET
Abs Immature Granulocytes: 0.03 10*3/uL (ref 0.00–0.07)
Basophils Absolute: 0 10*3/uL (ref 0.0–0.1)
Basophils Relative: 0 %
Eosinophils Absolute: 0 10*3/uL (ref 0.0–0.5)
Eosinophils Relative: 0 %
HCT: 37.7 % (ref 36.0–46.0)
Hemoglobin: 11.5 g/dL — ABNORMAL LOW (ref 12.0–15.0)
Immature Granulocytes: 0 %
Lymphocytes Relative: 29 %
Lymphs Abs: 2.1 10*3/uL (ref 0.7–4.0)
MCH: 23.9 pg — ABNORMAL LOW (ref 26.0–34.0)
MCHC: 30.5 g/dL (ref 30.0–36.0)
MCV: 78.4 fL — ABNORMAL LOW (ref 80.0–100.0)
Monocytes Absolute: 0.3 10*3/uL (ref 0.1–1.0)
Monocytes Relative: 4 %
Neutro Abs: 4.8 10*3/uL (ref 1.7–7.7)
Neutrophils Relative %: 67 %
Platelets: 279 10*3/uL (ref 150–400)
RBC: 4.81 MIL/uL (ref 3.87–5.11)
RDW: 14.2 % (ref 11.5–15.5)
WBC: 7.3 10*3/uL (ref 4.0–10.5)
nRBC: 0 % (ref 0.0–0.2)

## 2019-11-09 LAB — FERRITIN: Ferritin: 240 ng/mL (ref 11–307)

## 2019-11-09 LAB — COMPREHENSIVE METABOLIC PANEL
ALT: 31 U/L (ref 0–44)
AST: 46 U/L — ABNORMAL HIGH (ref 15–41)
Albumin: 2.8 g/dL — ABNORMAL LOW (ref 3.5–5.0)
Alkaline Phosphatase: 41 U/L (ref 38–126)
Anion gap: 13 (ref 5–15)
BUN: 13 mg/dL (ref 8–23)
CO2: 29 mmol/L (ref 22–32)
Calcium: 8.1 mg/dL — ABNORMAL LOW (ref 8.9–10.3)
Chloride: 95 mmol/L — ABNORMAL LOW (ref 98–111)
Creatinine, Ser: 0.72 mg/dL (ref 0.44–1.00)
GFR calc Af Amer: >60 mL/min (ref >60)
GFR calc non Af Amer: >60 mL/min (ref >60)
Glucose, Bld: 243 mg/dL — ABNORMAL HIGH (ref 70–99)
Potassium: 4.1 mmol/L (ref 3.5–5.1)
Sodium: 137 mmol/L (ref 135–145)
Total Bilirubin: 0.6 mg/dL (ref 0.3–1.2)
Total Protein: 6.5 g/dL (ref 6.5–8.1)

## 2019-11-09 LAB — HIV ANTIBODY (ROUTINE TESTING W REFLEX): HIV Screen 4th Generation wRfx: NONREACTIVE

## 2019-11-09 LAB — GLUCOSE, CAPILLARY
Glucose-Capillary: 243 mg/dL — ABNORMAL HIGH (ref 70–99)
Glucose-Capillary: 243 mg/dL — ABNORMAL HIGH (ref 70–99)
Glucose-Capillary: 210 mg/dL — ABNORMAL HIGH (ref 70–99)
Glucose-Capillary: 88 mg/dL (ref 70–99)

## 2019-11-09 LAB — MAGNESIUM: Magnesium: 2.2 mg/dL (ref 1.7–2.4)

## 2019-11-09 LAB — D-DIMER, QUANTITATIVE: D-Dimer, Quant: 2.27 ug/mL-FEU — ABNORMAL HIGH (ref 0.00–0.50)

## 2019-11-09 LAB — C-REACTIVE PROTEIN: CRP: 11.8 mg/dL — ABNORMAL HIGH (ref <1.0)

## 2019-11-09 LAB — TSH: TSH: 0.666 u[IU]/mL (ref 0.350–4.500)

## 2019-11-09 LAB — PHOSPHORUS: Phosphorus: 3.8 mg/dL (ref 2.5–4.6)

## 2019-11-09 NOTE — ED Notes (Signed)
Tried to give report, RN unavailable at this time.

## 2019-11-09 NOTE — Progress Notes (Signed)
PROGRESS NOTE    Valerie Cline    Code Status: Full Code  [id number] DOB: Jul 01, 1956 DOA: 11/08/2019  PCP: Marton Redwood, MD    Hospital Summary  This 64 year old female with history of hypertension, diabetes, lymphadenopathy concerning for lymphoma really undergoing work-up with oncology who presented with shortness of breath and admitted on 1/8 found to be COVID-19 positive and started on remdesivir, dexamethasone and Actemra on 1/8.  A & P   Active Problems:   Lymphadenopathy, abdominal   Pneumonia due to COVID-19 virus   Essential hypertension   T2DM (type 2 diabetes mellitus) ([place])   1. Acute hypoxic respiratory failure secondary to COVID-19 pneumonia a. Day 2/5 remdesivir b. Day 2/10 dexamethasone c. Status post Actemra x1 on 1/8 d. Low-grade fever, satting well on between 1 to 2 L/min nasal cannula e. CRP stable at 11.8 f. Trend daily labs 2. Hypertension a. Continue home amlodipine, hydrochlorothiazide, Benzapril 3. Lymphadenopathy concerning for lymphoma a. Biopsy and PET scan planned, currently on hold due to Covid positive b. Upcoming oncology outpatient visit 4. Type 2 diabetes a. Holding home Trulicity b. GU5K 8.2 c. Continue current insulin regimen 5. Hypokalemia resolved 6. Mildly elevated AST, viral versus remdesivir induced a. Monitor. 7. Hyperlipidemia on Crestor 8. Thyroid nodule a. TSH b. Outpatient ultrasound   DVT prophylaxis: Lovenox Family Communication: Cussed with patient's husband Disposition Plan: Inpatient pending clinical stability  Consultants  None  Procedures  None  Antibiotics   Anti-infectives (From admission, onward)   Start     Dose/Rate Route Frequency Ordered Stop   11/09/19 2100  remdesivir 100 mg in sodium chloride 0.9 % 100 mL IVPB     100 mg 200 mL/hr over 30 Minutes Intravenous Daily 11/08/19 2103 11/13/19 2059   11/08/19 2130  remdesivir 200 mg in sodium chloride 0.9% 250 mL IVPB     200 mg 580 mL/hr over  30 Minutes Intravenous Once 11/08/19 2103 11/08/19 2251           Subjective   Patient seen and examined at bedside in no acute distress and resting comfortably. No acute events overnight. Denies any acute complaints at this time. Ambulating. Tolerating diet well.   Objective   Vitals:   11/09/19 0400 11/09/19 0540 11/09/19 0800 11/09/19 1500  BP: (!) 128/56 128/75 (!) 149/65 129/63  Pulse: 73 78  95  Resp:  19 20 20   Temp:   98.7 F (37.1 C) 99 F (37.2 C)  TempSrc:   Oral Oral  SpO2: 96% 95% 94% 94%    Intake/Output Summary (Last 24 hours) at 11/09/2019 1819 Last data filed at 11/09/2019 1000 Gross per 24 hour  Intake 631.41 ml  Output --  Net 631.41 ml   There were no vitals filed for this visit.  Examination:  Physical Exam Vitals and nursing note reviewed.  Constitutional:      Appearance: Normal appearance.  HENT:     Head: Normocephalic and atraumatic.     Nose: Nose normal.     Mouth/Throat:     Mouth: Mucous membranes are moist.  Eyes:     Extraocular Movements: Extraocular movements intact.  Neck:     Comments: No thyromegaly or nodules palpated No lymphadenopathy palpated Cardiovascular:     Rate and Rhythm: Normal rate and regular rhythm.  Pulmonary:     Effort: Pulmonary effort is normal.     Breath sounds: Normal breath sounds.  Abdominal:     General: Abdomen is flat.  Palpations: Abdomen is soft.  Musculoskeletal:        General: No swelling.  Neurological:     General: No focal deficit present.     Mental Status: She is alert. Mental status is at baseline.  Psychiatric:        Mood and Affect: Mood normal.        Behavior: Behavior normal.     Data Reviewed: I have personally reviewed following labs and imaging studies  CBC: Recent Labs  Lab 11/08/19 1255 11/08/19 2028 11/09/19 0438  WBC 8.9 8.3 7.3  NEUTROABS  --   --  4.8  HGB 11.6* 11.2* 11.5*  HCT 37.3 36.8 37.7  MCV 77.5* 77.8* 78.4*  PLT 275 300 762   Basic  Metabolic Panel: Recent Labs  Lab 11/08/19 1255 11/08/19 2028 11/09/19 0438  NA 135  --  137  K 3.1*  --  4.1  CL 93*  --  95*  CO2 31  --  29  GLUCOSE 181*  --  243*  BUN 17  --  13  CREATININE 0.83 0.83 0.72  CALCIUM 8.3*  --  8.1*  MG  --   --  2.2  PHOS  --   --  3.8   GFR: Estimated Creatinine Clearance: 76.7 mL/min (by C-G formula based on SCr of 0.72 mg/dL). Liver Function Tests: Recent Labs  Lab 11/08/19 1804 11/09/19 0438  AST 47* 46*  ALT 31 31  ALKPHOS 47 41  BILITOT 0.7 0.6  PROT 6.8 6.5  ALBUMIN 3.1* 2.8*   No results for input(s): LIPASE, AMYLASE in the last 168 hours. No results for input(s): AMMONIA in the last 168 hours. Coagulation Profile: No results for input(s): INR, PROTIME in the last 168 hours. Cardiac Enzymes: No results for input(s): CKTOTAL, CKMB, CKMBINDEX, TROPONINI in the last 168 hours. BNP (last 3 results) No results for input(s): PROBNP in the last 8760 hours. HbA1C: Recent Labs    11/08/19 2028  HGBA1C 8.2*   CBG: Recent Labs  Lab 11/08/19 2256 11/09/19 0833 11/09/19 1237 11/09/19 1556  GLUCAP 103* 243* 243* 210*   Lipid Profile: Recent Labs    11/08/19 1804  TRIG 222*   Thyroid Function Tests: Recent Labs    11/08/19 1815  TSH 0.666   Anemia Panel: Recent Labs    11/08/19 1804 11/09/19 0438  FERRITIN 203 240   Sepsis Labs: Recent Labs  Lab 11/08/19 1804 11/08/19 2030  PROCALCITON 0.13  --   LATICACIDVEN  --  0.8    Recent Results (from the past 240 hour(s))  Blood Culture (routine x 2)     Status: None (Preliminary result)   Collection Time: 11/08/19  6:04 PM   Specimen: BLOOD LEFT WRIST  Result Value Ref Range Status   Specimen Description BLOOD LEFT WRIST  Final   Special Requests   Final    BOTTLES DRAWN AEROBIC AND ANAEROBIC Blood Culture adequate volume   Culture   Final    NO GROWTH < 12 HOURS Performed at [facility], [place] [address], [place], [place] [address]    Report  Status PENDING  Incomplete  Blood Culture (routine x 2)     Status: None (Preliminary result)   Collection Time: 11/08/19  6:04 PM   Specimen: BLOOD RIGHT ARM  Result Value Ref Range Status   Specimen Description BLOOD RIGHT ARM  Final   Special Requests   Final    BOTTLES DRAWN AEROBIC AND ANAEROBIC Blood  Culture results may not be optimal due to an excessive volume of blood received in culture bottles   Culture   Final    NO GROWTH < 12 HOURS Performed at [place] [address] [place], [place] [address]    Report Status PENDING  Incomplete         Radiology Studies: DG Chest Port 1 View  Result Date: 11/08/2019 CLINICAL DATA:  COVID-19, cough EXAM: PORTABLE CHEST 1 VIEW COMPARISON:  None. FINDINGS: Surgical hardware from ACDF overlies the lower cervical spine. Normal heart size. Aortic arch atherosclerosis. Otherwise normal mediastinal contour. No pneumothorax. No pleural effusion. Patchy and platelike opacities throughout the mid to lower lungs bilaterally. IMPRESSION: Patchy and platelike opacities throughout the mid to lower lungs bilaterally compatible with COVID-19 pneumonia. Electronically Signed   By: Ilona Sorrel M.D.   On: 11/08/2019 17:36        Scheduled Meds: . amLODipine  2.5 mg Oral QHS  . aspirin EC  81 mg Oral QHS  . benazepril  20 mg Oral QHS  . dexamethasone  6 mg Oral Daily  . enoxaparin (LOVENOX) injection  40 mg Subcutaneous Q24H  . hydrochlorothiazide  25 mg Oral QHS  . insulin regular  60 Units Subcutaneous q morning - 10a   And  . insulin regular  40 Units Subcutaneous BID WC  . rosuvastatin  10 mg Oral QHS   Continuous Infusions: . 0.9 % NaCl with KCl 20 mEq / L 75 mL/hr at 11/08/19 2252  . remdesivir 100 mg in NS 100 mL       LOS: 1 day    Time spent: 25 minutes with over 50% of the time coordinating the patient's care    Harold Hedge, DO [facility] Pager [phone]  If 7PM-7AM, please contact  night-coverage [email or web address] Password [facility] 11/09/2019, 6:19 PM

## 2019-11-09 NOTE — ED Notes (Signed)
Tried to give report second time. Staff not available, not able to answer phone.

## 2019-11-09 NOTE — ED Notes (Signed)
Gave report to [facility], but says room is not ready yet and not sure when its going to be ready.

## 2019-11-09 NOTE — ED Notes (Signed)
MS   Breakfast ordered  

## 2019-11-10 ENCOUNTER — Inpatient Hospital Stay (HOSPITAL_COMMUNITY): Payer: BC Managed Care – PPO

## 2019-11-10 LAB — COMPREHENSIVE METABOLIC PANEL
ALT: 30 U/L (ref 0–44)
AST: 40 U/L (ref 15–41)
Albumin: 2.6 g/dL — ABNORMAL LOW (ref 3.5–5.0)
Alkaline Phosphatase: 46 U/L (ref 38–126)
Anion gap: 12 (ref 5–15)
BUN: 17 mg/dL (ref 8–23)
CO2: 26 mmol/L (ref 22–32)
Calcium: 8.3 mg/dL — ABNORMAL LOW (ref 8.9–10.3)
Chloride: 100 mmol/L (ref 98–111)
Creatinine, Ser: 0.59 mg/dL (ref 0.44–1.00)
GFR calc Af Amer: >60 mL/min (ref >60)
GFR calc non Af Amer: >60 mL/min (ref >60)
Glucose, Bld: 260 mg/dL — ABNORMAL HIGH (ref 70–99)
Potassium: 4.6 mmol/L (ref 3.5–5.1)
Sodium: 138 mmol/L (ref 135–145)
Total Bilirubin: 0.5 mg/dL (ref 0.3–1.2)
Total Protein: 6.2 g/dL — ABNORMAL LOW (ref 6.5–8.1)

## 2019-11-10 LAB — CBC WITH DIFFERENTIAL/PLATELET
Abs Immature Granulocytes: 0.03 10*3/uL (ref 0.00–0.07)
Basophils Absolute: 0 10*3/uL (ref 0.0–0.1)
Basophils Relative: 0 %
Eosinophils Absolute: 0 10*3/uL (ref 0.0–0.5)
Eosinophils Relative: 1 %
HCT: 36.4 % (ref 36.0–46.0)
Hemoglobin: 11.1 g/dL — ABNORMAL LOW (ref 12.0–15.0)
Immature Granulocytes: 1 %
Lymphocytes Relative: 37 %
Lymphs Abs: 2.1 10*3/uL (ref 0.7–4.0)
MCH: 23.6 pg — ABNORMAL LOW (ref 26.0–34.0)
MCHC: 30.5 g/dL (ref 30.0–36.0)
MCV: 77.4 fL — ABNORMAL LOW (ref 80.0–100.0)
Monocytes Absolute: 0.3 10*3/uL (ref 0.1–1.0)
Monocytes Relative: 6 %
Neutro Abs: 3.2 10*3/uL (ref 1.7–7.7)
Neutrophils Relative %: 55 %
Platelets: 360 10*3/uL (ref 150–400)
RBC: 4.7 MIL/uL (ref 3.87–5.11)
RDW: 14.2 % (ref 11.5–15.5)
WBC: 5.9 10*3/uL (ref 4.0–10.5)
nRBC: 0 % (ref 0.0–0.2)

## 2019-11-10 LAB — GLUCOSE, CAPILLARY
Glucose-Capillary: 123 mg/dL — ABNORMAL HIGH (ref 70–99)
Glucose-Capillary: 281 mg/dL — ABNORMAL HIGH (ref 70–99)
Glucose-Capillary: 295 mg/dL — ABNORMAL HIGH (ref 70–99)
Glucose-Capillary: 62 mg/dL — ABNORMAL LOW (ref 70–99)
Glucose-Capillary: 67 mg/dL — ABNORMAL LOW (ref 70–99)

## 2019-11-10 LAB — C-REACTIVE PROTEIN: CRP: 7 mg/dL — ABNORMAL HIGH (ref <1.0)

## 2019-11-10 LAB — PHOSPHORUS: Phosphorus: 3.1 mg/dL (ref 2.5–4.6)

## 2019-11-10 LAB — D-DIMER, QUANTITATIVE: D-Dimer, Quant: 2.23 ug/mL-FEU — ABNORMAL HIGH (ref 0.00–0.50)

## 2019-11-10 LAB — FERRITIN: Ferritin: 294 ng/mL (ref 11–307)

## 2019-11-10 LAB — MAGNESIUM: Magnesium: 1.9 mg/dL (ref 1.7–2.4)

## 2019-11-10 MED ORDER — IOHEXOL 350 MG/ML SOLN
100.0000 mL | Freq: Once | INTRAVENOUS | Status: AC | PRN
  Administered 2019-11-10: 80 mL via INTRAVENOUS

## 2019-11-10 NOTE — Progress Notes (Signed)
PROGRESS NOTE    Valerie Cline    Code Status: Full Code  PYK:[id number] DOB: 1956/07/14 DOA: 11/08/2019  PCP: Marton Redwood, MD    Hospital Summary  This 64 year old female with history of hypertension, diabetes, lymphadenopathy concerning for lymphoma really undergoing work-up with oncology who presented with shortness of breath and admitted on 1/8 found to be COVID-19 positive and started on remdesivir, dexamethasone and Actemra on 1/8.  A & P   Active Problems:   Lymphadenopathy, abdominal   Pneumonia due to COVID-19 virus   Essential hypertension   T2DM (type 2 diabetes mellitus) ([place])   1. Acute hypoxic respiratory failure secondary to COVID-19 pneumonia a. Day 3/5 remdesivir b. Day 3/10 dexamethasone c. Status post Actemra x1 on 1/8 d. Low-grade fever, satting well on 1L/min nasal canula e. CRP down trending f. Trend daily labs 2. Elevated D Dimer with concern for active malignancy a. [place]: 1 (if counting possible underlying lymphoma), PERC: 2 b. CTA chest  3. Hypertension a. Continue home amlodipine, hydrochlorothiazide, Benzapril 4. Lymphadenopathy concerning for lymphoma a. Biopsy and PET scan planned, currently on hold due to Covid positive b. Upcoming oncology outpatient visit 5. Type 2 diabetes a. Holding home Trulicity b. NL9J 8.2 c. Continue current insulin regimen 6. Hypokalemia resolved 7. Mildly elevated AST, viral versus remdesivir induced, resolved 8. Hyperlipidemia on Crestor 9. Incidental 2 cm Left sided thyroid nodule a. On 10/26/19 CT chest b. TSH 0.666 c. Outpatient ultrasound   DVT prophylaxis: Lovenox Family Communication updated patient's husband by phone Disposition Plan: Barrier to discharge is continued hypoxia, CTA chest.  Hopeful discharge tomorrow with outpatient remdesivir infusions and likely will need oxygen.  Will consult TOC team  Consultants  None  Procedures  None  Antibiotics   Anti-infectives (From admission,  onward)   Start     Dose/Rate Route Frequency Ordered Stop   11/09/19 2100  remdesivir 100 mg in sodium chloride 0.9 % 100 mL IVPB     100 mg 200 mL/hr over 30 Minutes Intravenous Daily 11/08/19 2103 11/13/19 2059   11/08/19 2130  remdesivir 200 mg in sodium chloride 0.9% 250 mL IVPB     200 mg 580 mL/hr over 30 Minutes Intravenous Once 11/08/19 2103 11/08/19 2251           Subjective   Examined at bedside no acute distress resting comfortably on 1 L nasal cannula.  Admits to some dyspnea on exertion but symptoms have improved since admission.  Denies any other complaints at this time.  Objective   Vitals:   11/09/19 0540 11/09/19 0800 11/09/19 1500 11/09/19 2305  BP: 128/75 (!) 149/65 129/63 (!) 148/63  Pulse: 78  95 80  Resp: 19 20 20 20   Temp:  98.7 F (37.1 C) 99 F (37.2 C) 99.1 F (37.3 C)  TempSrc:  Oral Oral Oral  SpO2: 95% 94% 94% 93%    Intake/Output Summary (Last 24 hours) at 11/10/2019 0739 Last data filed at 11/10/2019 0500 Gross per 24 hour  Intake 930.12 ml  Output --  Net 930.12 ml   There were no vitals filed for this visit.  Examination:  Physical Exam Vitals and nursing note reviewed.  Constitutional:      Appearance: Normal appearance. She is not ill-appearing.  HENT:     Head: Normocephalic and atraumatic.  Eyes:     Conjunctiva/sclera: Conjunctivae normal.  Cardiovascular:     Rate and Rhythm: Normal rate and regular rhythm.  Pulmonary:  Effort: Pulmonary effort is normal.     Breath sounds: Normal breath sounds.     Comments: 1 L nasal cannula Abdominal:     General: Abdomen is flat.     Palpations: Abdomen is soft.  Musculoskeletal:        General: No swelling. Normal range of motion.  Neurological:     General: No focal deficit present.     Mental Status: She is alert. Mental status is at baseline.  Psychiatric:        Mood and Affect: Mood normal.        Behavior: Behavior normal.     Data Reviewed: I have personally  reviewed following labs and imaging studies  CBC: Recent Labs  Lab 11/08/19 1255 11/08/19 2028 11/09/19 0438 11/10/19 0419  WBC 8.9 8.3 7.3 5.9  NEUTROABS  --   --  4.8 3.2  HGB 11.6* 11.2* 11.5* 11.1*  HCT 37.3 36.8 37.7 36.4  MCV 77.5* 77.8* 78.4* 77.4*  PLT 275 300 279 614   Basic Metabolic Panel: Recent Labs  Lab 11/08/19 1255 11/08/19 2028 11/09/19 0438 11/10/19 0419  NA 135  --  137 138  K 3.1*  --  4.1 4.6  CL 93*  --  95* 100  CO2 31  --  29 26  GLUCOSE 181*  --  243* 260*  BUN 17  --  13 17  CREATININE 0.83 0.83 0.72 0.59  CALCIUM 8.3*  --  8.1* 8.3*  MG  --   --  2.2 1.9  PHOS  --   --  3.8 3.1   GFR: Estimated Creatinine Clearance: 76.7 mL/min (by C-G formula based on SCr of 0.59 mg/dL). Liver Function Tests: Recent Labs  Lab 11/08/19 1804 11/09/19 0438 11/10/19 0419  AST 47* 46* 40  ALT 31 31 30   ALKPHOS 47 41 46  BILITOT 0.7 0.6 0.5  PROT 6.8 6.5 6.2*  ALBUMIN 3.1* 2.8* 2.6*   No results for input(s): LIPASE, AMYLASE in the last 168 hours. No results for input(s): AMMONIA in the last 168 hours. Coagulation Profile: No results for input(s): INR, PROTIME in the last 168 hours. Cardiac Enzymes: No results for input(s): CKTOTAL, CKMB, CKMBINDEX, TROPONINI in the last 168 hours. BNP (last 3 results) No results for input(s): PROBNP in the last 8760 hours. HbA1C: Recent Labs    11/08/19 2028  HGBA1C 8.2*   CBG: Recent Labs  Lab 11/08/19 2256 11/09/19 0833 11/09/19 1237 11/09/19 1556 11/09/19 2115  GLUCAP 103* 243* 243* 210* 88   Lipid Profile: Recent Labs    11/08/19 1804  TRIG 222*   Thyroid Function Tests: Recent Labs    11/08/19 1815  TSH 0.666   Anemia Panel: Recent Labs    11/09/19 0438 11/10/19 0419  FERRITIN 240 294   Sepsis Labs: Recent Labs  Lab 11/08/19 1804 11/08/19 2030  PROCALCITON 0.13  --   LATICACIDVEN  --  0.8    Recent Results (from the past 240 hour(s))  Blood Culture (routine x 2)      Status: None (Preliminary result)   Collection Time: 11/08/19  6:04 PM   Specimen: BLOOD LEFT WRIST  Result Value Ref Range Status   Specimen Description BLOOD LEFT WRIST  Final   Special Requests   Final    BOTTLES DRAWN AEROBIC AND ANAEROBIC Blood Culture adequate volume   Culture   Final    NO GROWTH < 12 HOURS Performed at [facility], [place]  [address] [place], [place] [address]    Report Status PENDING  Incomplete  Blood Culture (routine x 2)     Status: None (Preliminary result)   Collection Time: 11/08/19  6:04 PM   Specimen: BLOOD RIGHT ARM  Result Value Ref Range Status   Specimen Description BLOOD RIGHT ARM  Final   Special Requests   Final    BOTTLES DRAWN AEROBIC AND ANAEROBIC Blood Culture results may not be optimal due to an excessive volume of blood received in culture bottles   Culture   Final    NO GROWTH < 12 HOURS Performed at [facility], [place] [address] [place], [place] [address]    Report Status PENDING  Incomplete         Radiology Studies: DG Chest Port 1 View  Result Date: 11/08/2019 CLINICAL DATA:  COVID-19, cough EXAM: PORTABLE CHEST 1 VIEW COMPARISON:  None. FINDINGS: Surgical hardware from ACDF overlies the lower cervical spine. Normal heart size. Aortic arch atherosclerosis. Otherwise normal mediastinal contour. No pneumothorax. No pleural effusion. Patchy and platelike opacities throughout the mid to lower lungs bilaterally. IMPRESSION: Patchy and platelike opacities throughout the mid to lower lungs bilaterally compatible with COVID-19 pneumonia. Electronically Signed   By: Ilona Sorrel M.D.   On: 11/08/2019 17:36        Scheduled Meds: . amLODipine  2.5 mg Oral QHS  . aspirin EC  81 mg Oral QHS  . benazepril  20 mg Oral QHS  . dexamethasone  6 mg Oral Daily  . enoxaparin (LOVENOX) injection  40 mg Subcutaneous Q24H  . hydrochlorothiazide  25 mg Oral QHS  . insulin regular  60 Units Subcutaneous q morning - 10a   And  .  insulin regular  40 Units Subcutaneous BID WC  . rosuvastatin  10 mg Oral QHS   Continuous Infusions: . 0.9 % NaCl with KCl 20 mEq / L 75 mL/hr at 11/10/19 0246  . remdesivir 100 mg in NS 100 mL 100 mg (11/09/19 2321)     LOS: 2 days    Time spent: 20 minutes with over 50% of the time coordinating the patient's care    Harold Hedge, DO [facility] Pager [phone]  If 7PM-7AM, please contact night-coverage [email or web address] Password [facility] 11/10/2019, 7:39 AM

## 2019-11-11 LAB — CBC WITH DIFFERENTIAL/PLATELET
Abs Immature Granulocytes: 0.05 10*3/uL (ref 0.00–0.07)
Basophils Absolute: 0 10*3/uL (ref 0.0–0.1)
Basophils Relative: 0 %
Eosinophils Absolute: 0.1 10*3/uL (ref 0.0–0.5)
Eosinophils Relative: 1 %
HCT: 37.3 % (ref 36.0–46.0)
Hemoglobin: 11.5 g/dL — ABNORMAL LOW (ref 12.0–15.0)
Immature Granulocytes: 1 %
Lymphocytes Relative: 42 %
Lymphs Abs: 3.4 10*3/uL (ref 0.7–4.0)
MCH: 23.9 pg — ABNORMAL LOW (ref 26.0–34.0)
MCHC: 30.8 g/dL (ref 30.0–36.0)
MCV: 77.4 fL — ABNORMAL LOW (ref 80.0–100.0)
Monocytes Absolute: 0.5 10*3/uL (ref 0.1–1.0)
Monocytes Relative: 6 %
Neutro Abs: 4.1 10*3/uL (ref 1.7–7.7)
Neutrophils Relative %: 50 %
Platelets: 415 10*3/uL — ABNORMAL HIGH (ref 150–400)
RBC: 4.82 MIL/uL (ref 3.87–5.11)
RDW: 14.2 % (ref 11.5–15.5)
WBC: 8.1 10*3/uL (ref 4.0–10.5)
nRBC: 0 % (ref 0.0–0.2)

## 2019-11-11 LAB — COMPREHENSIVE METABOLIC PANEL
ALT: 36 U/L (ref 0–44)
AST: 47 U/L — ABNORMAL HIGH (ref 15–41)
Albumin: 2.7 g/dL — ABNORMAL LOW (ref 3.5–5.0)
Alkaline Phosphatase: 49 U/L (ref 38–126)
Anion gap: 9 (ref 5–15)
BUN: 17 mg/dL (ref 8–23)
CO2: 27 mmol/L (ref 22–32)
Calcium: 8.7 mg/dL — ABNORMAL LOW (ref 8.9–10.3)
Chloride: 105 mmol/L (ref 98–111)
Creatinine, Ser: 0.59 mg/dL (ref 0.44–1.00)
GFR calc Af Amer: >60 mL/min (ref >60)
GFR calc non Af Amer: >60 mL/min (ref >60)
Glucose, Bld: 143 mg/dL — ABNORMAL HIGH (ref 70–99)
Potassium: 5 mmol/L (ref 3.5–5.1)
Sodium: 141 mmol/L (ref 135–145)
Total Bilirubin: 0.7 mg/dL (ref 0.3–1.2)
Total Protein: 6.2 g/dL — ABNORMAL LOW (ref 6.5–8.1)

## 2019-11-11 LAB — GLUCOSE, CAPILLARY: Glucose-Capillary: 234 mg/dL — ABNORMAL HIGH (ref 70–99)

## 2019-11-11 LAB — PHOSPHORUS: Phosphorus: 4 mg/dL (ref 2.5–4.6)

## 2019-11-11 LAB — MAGNESIUM: Magnesium: 1.9 mg/dL (ref 1.7–2.4)

## 2019-11-11 LAB — D-DIMER, QUANTITATIVE: D-Dimer, Quant: 1.73 ug/mL-FEU — ABNORMAL HIGH (ref 0.00–0.50)

## 2019-11-11 LAB — FERRITIN: Ferritin: 305 ng/mL (ref 11–307)

## 2019-11-11 LAB — C-REACTIVE PROTEIN: CRP: 2.7 mg/dL — ABNORMAL HIGH (ref <1.0)

## 2019-11-11 NOTE — Discharge Summary (Signed)
Physician Discharge Summary  Valerie Cline NID:[id number] DOB: 1956/10/05 DOA: 11/08/2019  PCP: Marton Redwood, MD  Admit date: 11/08/2019 Discharge date: 11/11/2019   Code Status: Full Code  Admitted From: home Discharged to: home Home Health:no  Equipment/Devices:no  Discharge Condition:stable   Recommendations for Outpatient Follow-up   1. Thyroid ultrasound ordered to evaluate thyroid nodule 2. One more day of remdesivir infusion on 1/12 scheduled for total 5 days 3. Discharged without dexamethasone as she is without oxygen requirement at this time 4. Will need to follow up with her oncologist  Hospital Summary  This 64 year old female with history of hypertension, diabetes, lymphadenopathy concerning for lymphoma really undergoing work-up with oncology who presented with shortness of breath and admitted on 1/8 found to be COVID-19 positive and started on remdesivir, dexamethasone and Actemra on 1/8. Underwent CTA chest on 1/10 which was negative for PE. Completed 4 days of remdesivir and dexamethasone. Has one more infusion scheduled at the [facility] infusion clinic 1/12 for total 5. Discharged without steroids as she is tolerating room air and had hyperglycemia  A & P   Active Problems:   Lymphadenopathy, abdominal   Pneumonia due to COVID-19 virus   Essential hypertension   T2DM (type 2 diabetes mellitus) ([place])  1. Acute hypoxic respiratory failure secondary to COVID-19 pneumonia a. Day 4/5 remdesivir, to complete treatment outpatient b. Day 4/4 dexamethasone, tolerating room air at discharge, no need for steroid c. Status post Actemra x1 on 1/8 for O2 requirement with elevated CRP 2. Elevated D Dimer with concern for active malignancy a. Wells: 1 (if counting possible underlying lymphoma), PERC: 2 b. CTA chest 1/10 negative for PE 3. Hypertension a. Continue home amlodipine, hydrochlorothiazide, Benzapril 4. Lymphadenopathy concerning for lymphoma a. Biopsy and PET scan  planned, currently on hold due to Covid positive b. Upcoming oncology outpatient visit 5. Type 2 diabetes 1. HA1C 8.2 a. Resume home medications and follow up outpatient 6. Hypoglycemia resolved 7. Hypokalemia resolved 8. Mildly elevated AST, viral versus remdesivir induced, resolved 9. Hyperlipidemia on Crestor 10. Incidental 2 cm Left sided thyroid nodule a. On 10/26/19 CT chest b. TSH 0.666 c. Outpatient ultrasound ordered   Consultants  . none  Procedures  . none  Antibiotics   Anti-infectives (From admission, onward)   Start     Dose/Rate Route Frequency Ordered Stop   11/09/19 2100  remdesivir 100 mg in sodium chloride 0.9 % 100 mL IVPB     100 mg 200 mL/hr over 30 Minutes Intravenous Daily 11/08/19 2103 11/13/19 2059   11/08/19 2130  remdesivir 200 mg in sodium chloride 0.9% 250 mL IVPB     200 mg 580 mL/hr over 30 Minutes Intravenous Once 11/08/19 2103 11/08/19 2251        Subjective  Patient seen and examined at bedside no acute distress and resting comfortably.  No events overnight.  Tolerating diet. In good spirits and anticipating discharge. Admits to some dry cough.  Denies any chest pain, shortness of breath, fever, nausea, vomiting, urinary or bowel complaints. Otherwise ROS negative    Objective   Discharge Exam: Vitals:   11/10/19 2200 11/11/19 0800  BP: (!) 167/71 (!) 147/64  Pulse: 73 75  Resp: 18 18  Temp: (!) 97.5 F (36.4 C) 98.3 F (36.8 C)  SpO2: 94% 94%   Vitals:   11/09/19 2305 11/10/19 0901 11/10/19 2200 11/11/19 0800  BP: (!) 148/63 (!) 152/71 (!) 167/71 (!) 147/64  Pulse: 80 85 73 75  Resp:  _0 Temp: 99.1 F (37.3 C) 97.8 F (36.6 C) (!) 97.5 F (36.4 C) 98.3 F (36.8 C)  TempSrc: Oral Oral Oral Oral  SpO2: 93% 94% 94% 94%    Physical Exam Vitals and nursing note reviewed.  Constitutional:      Appearance: Normal appearance.  HENT:     Head: Normocephalic and atraumatic.     Nose: Nose normal.      Mouth/Throat:     Mouth: Mucous membranes are moist.  Eyes:     Extraocular Movements: Extraocular movements intact.  Cardiovascular:     Rate and Rhythm: Normal rate and regular rhythm.  Pulmonary:     Effort: Pulmonary effort is normal.     Breath sounds: Normal breath sounds.  Abdominal:     General: Abdomen is flat.     Palpations: Abdomen is soft.  Musculoskeletal:     Cervical back: Normal range of motion. No rigidity.  Neurological:     General: No focal deficit present.     Mental Status: She is alert. Mental status is at baseline.  Psychiatric:        Mood and Affect: Mood normal.        Behavior: Behavior normal.       The results of significant diagnostics from this hospitalization (including imaging, microbiology, ancillary and laboratory) are listed below for reference.     Microbiology: Recent Results (from the past 240 hour(s))  Blood Culture (routine x 2)     Status: None (Preliminary result)   Collection Time: 11/08/19  6:04 PM   Specimen: BLOOD LEFT WRIST  Result Value Ref Range Status   Specimen Description BLOOD LEFT WRIST  Final   Special Requests   Final    BOTTLES DRAWN AEROBIC AND ANAEROBIC Blood Culture adequate volume   Culture   Final    NO GROWTH 3 DAYS Performed at [facility], [address] [place], [place] [address]    Report Status PENDING  Incomplete  Blood Culture (routine x 2)     Status: None (Preliminary result)   Collection Time: 11/08/19  6:04 PM   Specimen: BLOOD RIGHT ARM  Result Value Ref Range Status   Specimen Description BLOOD RIGHT ARM  Final   Special Requests   Final    BOTTLES DRAWN AEROBIC AND ANAEROBIC Blood Culture results may not be optimal due to an excessive volume of blood received in culture bottles   Culture   Final    NO GROWTH 3 DAYS Performed at [facility], [place] [address] [place], [place] [address]    Report Status PENDING  Incomplete     Labs: BNP (last 3 results) No results for  input(s): BNP in the last 8760 hours. Basic Metabolic Panel: Recent Labs  Lab 11/08/19 1255 11/08/19 2028 11/09/19 0438 11/10/19 0419 11/11/19 0403  NA 135  --  137 138 141  K 3.1*  --  4.1 4.6 5.0  CL 93*  --  95* 100 105  CO2 31  --  _1 GLUCOSE 181*  --  243* 260* 143*  BUN 17  --  _2 CREATININE 0.83 0.83 0.72 0.59 0.59  CALCIUM 8.3*  --  8.1* 8.3* 8.7*  MG  --   --  2.2 1.9 1.9  PHOS  --   --  3.8 3.1 4.0   Liver Function Tests: Recent Labs  Lab 11/08/19 1804 11/09/19 0438 11/10/19 0419  11/11/19 0403  AST 47* 46* 40 47*  ALT _0 36  ALKPHOS 47 41 46 49  BILITOT 0.7 0.6 0.5 0.7  PROT 6.8 6.5 6.2* 6.2*  ALBUMIN 3.1* 2.8* 2.6* 2.7*   No results for input(s): LIPASE, AMYLASE in the last 168 hours. No results for input(s): AMMONIA in the last 168 hours. CBC: Recent Labs  Lab 11/08/19 1255 11/08/19 2028 11/09/19 0438 11/10/19 0419 11/11/19 0403  WBC 8.9 8.3 7.3 5.9 8.1  NEUTROABS  --   --  4.8 3.2 4.1  HGB 11.6* 11.2* 11.5* 11.1* 11.5*  HCT 37.3 36.8 37.7 36.4 37.3  MCV 77.5* 77.8* 78.4* 77.4* 77.4*  PLT 275 300 279 360 415*   Cardiac Enzymes: No results for input(s): CKTOTAL, CKMB, CKMBINDEX, TROPONINI in the last 168 hours. BNP: Invalid input(s): POCBNP CBG: Recent Labs  Lab 11/10/19 1316 11/10/19 2247 11/10/19 2304 11/10/19 2349 11/11/19 0831  GLUCAP 281* 62* 67* 123* 234*   D-Dimer Recent Labs    11/10/19 0419 11/11/19 0403  DDIMER 2.23* 1.73*   Hgb A1c Recent Labs    11/08/19 2028  HGBA1C 8.2*   Lipid Profile Recent Labs    11/08/19 1804  TRIG 222*   Thyroid function studies Recent Labs    11/08/19 1815  TSH 0.666   Anemia work up Recent Labs    11/10/19 0419 11/11/19 0403  FERRITIN 294 305   Urinalysis    Component Value Date/Time   COLORURINE STRAW (A) 10/26/2019 1857   APPEARANCEUR HAZY (A) 10/26/2019 1857   LABSPEC >1.030 (H) 10/26/2019 1857   PHURINE 5.5 10/26/2019 1857   GLUCOSEU  NEGATIVE 10/26/2019 1857   HGBUR TRACE (A) 10/26/2019 1857   BILIRUBINUR NEGATIVE 10/26/2019 1857   KETONESUR NEGATIVE 10/26/2019 1857   PROTEINUR NEGATIVE 10/26/2019 1857   NITRITE NEGATIVE 10/26/2019 1857   LEUKOCYTESUR SMALL (A) 10/26/2019 1857   Sepsis Labs Invalid input(s): PROCALCITONIN,  WBC,  LACTICIDVEN Microbiology Recent Results (from the past 240 hour(s))  Blood Culture (routine x 2)     Status: None (Preliminary result)   Collection Time: 11/08/19  6:04 PM   Specimen: BLOOD LEFT WRIST  Result Value Ref Range Status   Specimen Description BLOOD LEFT WRIST  Final   Special Requests   Final    BOTTLES DRAWN AEROBIC AND ANAEROBIC Blood Culture adequate volume   Culture   Final    NO GROWTH 3 DAYS Performed at [facility], [place] [address], [place], [place] [address]    Report Status PENDING  Incomplete  Blood Culture (routine x 2)     Status: None (Preliminary result)   Collection Time: 11/08/19  6:04 PM   Specimen: BLOOD RIGHT ARM  Result Value Ref Range Status   Specimen Description BLOOD RIGHT ARM  Final   Special Requests   Final    BOTTLES DRAWN AEROBIC AND ANAEROBIC Blood Culture results may not be optimal due to an excessive volume of blood received in culture bottles   Culture   Final    NO GROWTH 3 DAYS Performed at [facility], [place] [address], [place], [place] [address]    Report Status PENDING  Incomplete    Discharge Instructions     Discharge Instructions    Diet - low sodium heart healthy   Complete by: As directed    Discharge instructions   Complete by: As directed    You were seen in the hospital for COVID-19.  Upon discharge: -Follow  up with your Oncologist as scheduled -You have an appointment scheduled at the [facility] infusion clinic for 1 remaining remdesivir infusions at 10 AM -Schedule an ultrasound of your thyroid to evaluate your thyroid nodule  -You are still contagious with COVID-19 and should self isolate at  home.  Isolation can be discontinued when the following criteria are met:  At least 10 days have passed since symptoms first appeared AND You are at least 1 day (24 hours) since resolution of fever without the use of any fever reducing medications (Tylenol, Motrin, ibuprofen, etc.) AND There is improvement in your symptoms (ex. shortness of breath, cough, etc.)  If you have any questions do not hesitate to contact your primary care physician or return to the ED if worsening symptoms.   Increase activity slowly   Complete by: As directed      Allergies as of 11/11/2019   No Known Allergies     Medication List    TAKE these medications   acetaminophen 500 MG tablet Commonly known as: TYLENOL Take 500 mg by mouth every 4 (four) hours as needed for mild pain, fever or headache.   amLODipine 2.5 MG tablet Commonly known as: NORVASC Take 2.5 mg by mouth at bedtime.   aspirin EC 81 MG tablet Take 81 mg by mouth at bedtime.   benazepril 20 MG tablet Commonly known as: LOTENSIN Take 20 mg by mouth at bedtime.   HUMULIN R 500 UNIT/ML injection Generic drug: insulin regular human CONCENTRATED Inject 50-70 Units into the skin See admin instructions. Inject 70 units subcutaneously before breakfast, and 50 units before lunch and supper - drawn on a U-500 syringe   hydrochlorothiazide 25 MG tablet Commonly known as: HYDRODIURIL Take 25 mg by mouth at bedtime.   rosuvastatin 10 MG tablet Commonly known as: CRESTOR Take 10 mg by mouth at bedtime.   Trulicity 1.5 BU/3.8GT Sopn Generic drug: Dulaglutide Inject 1.5 mg into the skin every Tuesday.   Vascepa 0.5 g Caps Generic drug: Icosapent Ethyl Take 0.5 mg by mouth daily.       No Known Allergies  Time coordinating discharge: Over 30 minutes   SIGNED:   Harold Hedge, D.O. [facility] Pager: [phone]  11/11/2019, 8:54 AM

## 2019-11-11 NOTE — Discharge Instructions (Signed)
You are scheduled for an outpatient infusion of Remdesivir at 1000AM on Tuesday 1/12.  Please [facility] to [facility] at [address]  Drive to the security guard and tell them you are here for an infusion. They will direct you to the front entrance where we will come and get you.  For questions call [phone].  Thanks       COVID-19: Quarantine vs. Isolation QUARANTINE keeps someone who was in close contact with someone who has COVID-19 away from others. If you had close contact with a person who has COVID-19  Stay home until 14 days after your last contact.  Check your temperature twice a day and watch for symptoms of COVID-19.  If possible, stay away from people who are at higher-risk for getting very sick from COVID-19. ISOLATION keeps someone who is sick or tested positive for COVID-19 without symptoms away from others, even in their own home. If you are sick and think or know you have COVID-19  Stay home until after ? At least 10 days since symptoms first appeared and ? At least 24 hours with no fever without fever-reducing medication and ? Symptoms have improved If you tested positive for COVID-19 but do not have symptoms  Stay home until after ? 10 days have passed since your positive test If you live with others, stay in a specific "sick room" or area and away from other people or animals, including pets. Use a separate bathroom, if available. [email or web address].[email or web address] 65/20/2020 This information is not intended to replace advice given to you by your health care provider. Make sure you discuss any questions you have with your health care provider. Document Revised: 65/12/2018 Document Reviewed: 65/12/2018 Elsevier Patient Education  [place].  COVID-19: How to Protect Yourself and Others Know how it spreads  There is currently no vaccine to prevent coronavirus disease 2019 (COVID-19).  The best way to prevent illness is to avoid being exposed to this  virus.  The virus is thought to spread mainly from person-to-person. ? Between people who are in close contact with one another (within about 6 feet). ? Through respiratory droplets produced when an infected person coughs, sneezes or talks. ? These droplets can land in the mouths or noses of people who are nearby or possibly be inhaled into the lungs. ? COVID-19 may be spread by people who are not showing symptoms. Everyone should Clean your hands often  Wash your hands often with soap and water for at least 20 seconds especially after you have been in a public place, or after blowing your nose, coughing, or sneezing.  If soap and water are not readily available, use a hand sanitizer that contains at least 60% alcohol. Cover all surfaces of your hands and rub them together until they feel dry.  Avoid touching your eyes, nose, and mouth with unwashed hands. Avoid close contact  Limit contact with others as much as possible.  Avoid close contact with people who are sick.  Put distance between yourself and other people. ? Remember that some people without symptoms may be able to spread virus. ? This is especially important for people who are at higher risk of getting very [employer]ain[employer]com.cy Cover your mouth and nose with a mask when around others  You could spread COVID-19 to others even if you do not feel sick.  Everyone should wear a mask in public settings and when around people not living in their household, especially when social distancing is difficult  to maintain. ? Masks should not be placed on young children under age 65, anyone who has trouble breathing, or is unconscious, incapacitated or otherwise unable to remove the mask without assistance.  The mask is meant to protect other people in case you are infected.  Do NOT use a facemask meant for a Dietitian.  Continue to keep about 6 feet between  yourself and others. The mask is not a substitute for social distancing. Cover coughs and sneezes  Always cover your mouth and nose with a tissue when you cough or sneeze or use the inside of your elbow.  Throw used tissues in the trash.  Immediately wash your hands with soap and water for at least 20 seconds. If soap and water are not readily available, clean your hands with a hand sanitizer that contains at least 60% alcohol. Clean and disinfect  Clean AND disinfect frequently touched surfaces daily. This includes tables, doorknobs, light switches, countertops, handles, desks, phones, keyboards, toilets, faucets, and sinks. [employer]ack[employer]fr  If surfaces are dirty, clean them: Use detergent or soap and water prior to disinfection.  Then, use a household disinfectant. You can see a list of EPA-registered household disinfectants here. [email or web address].com 65/12/2018 This information is not intended to replace advice given to you by your health care provider. Make sure you discuss any questions you have with your health care provider. Document Revised: 65/08/2019 Document Reviewed: 65/07/2019 Elsevier Patient Education  [place].

## 2019-11-11 NOTE — Care Management (Addendum)
Spoke w patient on the phone and reviewed instructions for infusion clinic. Also added information to AVS.  Patient verified she has been on RA as charted.  She has PCP/ insurance coverage. She has a ride home.

## 2019-11-11 NOTE — Progress Notes (Signed)
Patient scheduled for outpatient Remdesivir infusion at 1000AM on Tuesday 1/12  Please advise them to report to [facility] at [address]  Drive to the security guard and tell them you are here for an infusion. They will direct you to the front entrance where we will come and get you.  For questions call [phone].  Thanks

## 2019-11-12 ENCOUNTER — Ambulatory Visit (HOSPITAL_COMMUNITY)
Admission: RE | Admit: 2019-11-12 | Discharge: 2019-11-12 | Disposition: A | Discharge status: Home / Self Care | Payer: BC Managed Care – PPO | Source: Ambulatory Visit | Attending: Pulmonary Disease | Admitting: Pulmonary Disease

## 2019-11-12 VITALS — BP 145/69 | HR 80 | Temp 98.1°F | Resp 18

## 2019-11-12 DIAGNOSIS — J1282 Pneumonia due to coronavirus disease 2019: Secondary | ICD-10-CM | POA: Insufficient documentation

## 2019-11-12 DIAGNOSIS — U071 COVID-19: Secondary | ICD-10-CM | POA: Diagnosis not present

## 2019-11-12 MED ORDER — SODIUM CHLORIDE 0.9 % IV SOLN
INTRAVENOUS | Status: DC | PRN
  Administered 2019-11-12: 250 mL via INTRAVENOUS

## 2019-11-12 MED ORDER — METHYLPREDNISOLONE SODIUM SUCC 125 MG IJ SOLR: 125.0000 mg | Freq: Once | INTRAMUSCULAR | Status: DC | PRN

## 2019-11-12 MED ORDER — DIPHENHYDRAMINE HCL 50 MG/ML IJ SOLN: 50.0000 mg | Freq: Once | INTRAMUSCULAR | Status: DC | PRN

## 2019-11-12 MED ORDER — FAMOTIDINE IN NACL 20-0.9 MG/50ML-% IV SOLN: 20.0000 mg | Freq: Once | INTRAVENOUS | Status: DC | PRN

## 2019-11-12 MED ORDER — EPINEPHRINE 0.3 MG/0.3ML IJ SOAJ: 0.3000 mg | Freq: Once | INTRAMUSCULAR | Status: DC | PRN

## 2019-11-12 MED ORDER — SODIUM CHLORIDE 0.9 % IV SOLN
INTRAVENOUS | Status: AC
  Administered 2019-11-12: 100 mg via INTRAVENOUS
  Filled 2019-11-12: qty 20

## 2019-11-12 MED ORDER — ALBUTEROL SULFATE HFA 108 (90 BASE) MCG/ACT IN AERS: 2.0000 | INHALATION_SPRAY | Freq: Once | RESPIRATORY_TRACT | Status: DC | PRN

## 2019-11-12 MED ORDER — SODIUM CHLORIDE 0.9 % IV SOLN: 100.0000 mg | Freq: Once | INTRAVENOUS | Status: AC

## 2019-11-12 NOTE — Discharge Instructions (Signed)
10 Things You Can Do to Manage Your COVID-19 Symptoms at Home If you have possible or confirmed COVID-19: 1. Stay home from work and school. And stay away from other public places. If you must go out, avoid using any kind of public transportation, ridesharing, or taxis. 2. Monitor your symptoms carefully. If your symptoms get worse, call your healthcare provider immediately. 3. Get rest and stay hydrated. 4. If you have a medical appointment, call the healthcare provider ahead of time and tell them that you have or may have COVID-19. 5. For medical emergencies, call 911 and notify the dispatch personnel that you have or may have COVID-19. 6. Cover your cough and sneezes with a tissue or use the inside of your elbow. 7. Wash your hands often with soap and water for at least 20 seconds or clean your hands with an alcohol-based hand sanitizer that contains at least 60% alcohol. 8. As much as possible, stay in a specific room and away from other people in your home. Also, you should use a separate bathroom, if available. If you need to be around other people in or outside of the home, wear a mask. 9. Avoid sharing personal items with other people in your household, like dishes, towels, and bedding. 10. Clean all surfaces that are touched often, like counters, tabletops, and doorknobs. Use household cleaning sprays or wipes according to the label instructions. cdc.gov/coronavirus 05/01/2019 This information is not intended to replace advice given to you by your health care provider. Make sure you discuss any questions you have with your health care provider. Document Revised: 10/03/2019 Document Reviewed: 10/03/2019 Elsevier Patient Education  2020 Elsevier Inc.  

## 2019-11-12 NOTE — Progress Notes (Signed)
  Diagnosis: COVID-19  Physician: Marton Redwood, MD  Procedure: Covid Infusion Clinic Med: remdesivir infusion.  Complications: No immediate complications noted.  Discharge: Discharged home   Valerie Cline 11/12/2019

## 2019-11-13 DIAGNOSIS — Z8616 Personal history of COVID-19: Secondary | ICD-10-CM

## 2019-11-13 DIAGNOSIS — R591 Generalized enlarged lymph nodes: Secondary | ICD-10-CM | POA: Diagnosis not present

## 2019-11-13 DIAGNOSIS — U071 COVID-19: Secondary | ICD-10-CM | POA: Diagnosis not present

## 2019-11-13 DIAGNOSIS — C8300 Small cell B-cell lymphoma, unspecified site: Secondary | ICD-10-CM | POA: Insufficient documentation

## 2019-11-13 HISTORY — DX: Small cell B-cell lymphoma, unspecified site: C83.00

## 2019-11-13 HISTORY — DX: Personal history of COVID-19: Z86.16

## 2019-11-13 LAB — CULTURE, BLOOD (ROUTINE X 2)
Culture: NO GROWTH
Culture: NO GROWTH
Special Requests: ADEQUATE

## 2019-11-14 ENCOUNTER — Encounter: Payer: Self-pay | Admitting: *Deleted

## 2019-11-14 NOTE — Progress Notes (Signed)
Called to check on patient. Her work up is delayed due to coivd infection. She was hospitalized until 11/11/19 but is now home and feeling much better.   She states she has her biopsy scheduled on 11/29/19. Her PET scan is scheduled for 11/25/19. Confirmed with the patient that she has all the instructions for PET prep. She has no other needs at this time.   Updated Dr Marin Olp.

## 2019-11-25 ENCOUNTER — Encounter (HOSPITAL_COMMUNITY)
Admission: RE | Admit: 2019-11-25 | Discharge: 2019-11-25 | Disposition: A | Discharge status: Home / Self Care | Payer: BC Managed Care – PPO | Source: Ambulatory Visit | Attending: Hematology & Oncology | Admitting: Hematology & Oncology

## 2019-11-25 ENCOUNTER — Other Ambulatory Visit: Payer: Self-pay

## 2019-11-25 DIAGNOSIS — R590 Localized enlarged lymph nodes: Secondary | ICD-10-CM | POA: Diagnosis not present

## 2019-11-25 DIAGNOSIS — C8330 Diffuse large B-cell lymphoma, unspecified site: Secondary | ICD-10-CM | POA: Diagnosis not present

## 2019-11-25 LAB — GLUCOSE, CAPILLARY: Glucose-Capillary: 241 mg/dL — ABNORMAL HIGH (ref 70–99)

## 2019-11-25 MED ORDER — FLUDEOXYGLUCOSE F - 18 (FDG) INJECTION
8.4800 | Freq: Once | INTRAVENOUS | Status: AC | PRN
  Administered 2019-11-25: 8.48 via INTRAVENOUS

## 2019-11-27 ENCOUNTER — Encounter: Payer: Self-pay | Admitting: *Deleted

## 2019-11-27 NOTE — Progress Notes (Signed)
Patient is calling me to notify us that her biopsy date has been rescheduled from 12/06/19 to 12/20/19. She went for her pre-procedure covid test this week and is still testing positive from her infection in early January. She is concerned that she may continue to test positive for some time and her biopsy will continue to get rescheduled. She is anxious to know what her final diagnosis will be.   Spoke to Dr Marin Olp. He will reach out to Dr Redmond Baseman and see if there is any flexibility to the covid testing. He did however state, that her PET scan showed improvement in her lymphadenopathy and as such, he did feel like we did have some ability for delay in biopsy.   I notified the patient of the above, including her PET scan showing reduction in her lymphadenopathy. She was appreciative of the information, and a little more at ease regarding the delay. We will update her if we receive any change after talking to Dr Redmond Baseman.

## 2019-12-09 ENCOUNTER — Encounter: Payer: Self-pay | Admitting: *Deleted

## 2019-12-09 NOTE — Progress Notes (Signed)
Patient called seeking update on Dr Marin Olp reaching out to Dr Redmond Baseman regarding biopsy and negative COVID testing.   Dr Marin Olp did speak to Dr Redmond Baseman and he is unable to change biopsy date. He states that negative covid test is the policy of the anesthesia team and he [place] unable to override their policy.  Update given to patient. She is appreciative of the information.

## 2019-12-16 DIAGNOSIS — Z01818 Encounter for other preprocedural examination: Secondary | ICD-10-CM | POA: Diagnosis not present

## 2019-12-20 ENCOUNTER — Other Ambulatory Visit: Payer: Self-pay | Admitting: Otolaryngology

## 2019-12-20 DIAGNOSIS — R590 Localized enlarged lymph nodes: Secondary | ICD-10-CM | POA: Diagnosis not present

## 2019-12-24 ENCOUNTER — Encounter: Payer: Self-pay | Admitting: *Deleted

## 2019-12-24 NOTE — Progress Notes (Signed)
Called Dr Redmond Baseman Office - [facility] and left messages x 2 for pathology results on biopsy taken 12/20/2019.   Awaiting  response

## 2019-12-25 ENCOUNTER — Encounter: Payer: Self-pay | Admitting: *Deleted

## 2019-12-25 NOTE — Progress Notes (Signed)
Received pathology report confirming CLL/SLL. Reviewed with Dr Marin Olp. He would like to see patient next week.  Called patient and scheduled a follow up appointment on Monday.

## 2019-12-26 DIAGNOSIS — C9110 Chronic lymphocytic leukemia of B-cell type not having achieved remission: Secondary | ICD-10-CM | POA: Insufficient documentation

## 2019-12-26 HISTORY — DX: Chronic lymphocytic leukemia of B-cell type not having achieved remission: C91.10

## 2019-12-30 ENCOUNTER — Inpatient Hospital Stay: Payer: Medicare Other | Attending: Hematology & Oncology

## 2019-12-30 ENCOUNTER — Inpatient Hospital Stay (HOSPITAL_BASED_OUTPATIENT_CLINIC_OR_DEPARTMENT_OTHER): Payer: Medicare Other | Admitting: Hematology & Oncology

## 2019-12-30 ENCOUNTER — Encounter: Payer: Self-pay | Admitting: Hematology & Oncology

## 2019-12-30 ENCOUNTER — Other Ambulatory Visit: Payer: Self-pay

## 2019-12-30 ENCOUNTER — Encounter: Payer: Self-pay | Admitting: *Deleted

## 2019-12-30 DIAGNOSIS — C8590 Non-Hodgkin lymphoma, unspecified, unspecified site: Secondary | ICD-10-CM | POA: Insufficient documentation

## 2019-12-30 DIAGNOSIS — C8308 Small cell B-cell lymphoma, lymph nodes of multiple sites: Secondary | ICD-10-CM | POA: Diagnosis not present

## 2019-12-30 DIAGNOSIS — R590 Localized enlarged lymph nodes: Secondary | ICD-10-CM

## 2019-12-30 DIAGNOSIS — Z794 Long term (current) use of insulin: Secondary | ICD-10-CM | POA: Diagnosis not present

## 2019-12-30 DIAGNOSIS — Z79899 Other long term (current) drug therapy: Secondary | ICD-10-CM | POA: Diagnosis not present

## 2019-12-30 HISTORY — DX: Small cell b-cell lymphoma, lymph nodes of multiple sites: C83.08

## 2019-12-30 LAB — CBC WITH DIFFERENTIAL (CANCER CENTER ONLY)
Band Neutrophils: 0 %
Basophils Absolute: 0.1 10*3/uL (ref 0.0–0.1)
Basophils Relative: 1 %
Eosinophils Absolute: 1.7 10*3/uL — ABNORMAL HIGH (ref 0.0–0.5)
Eosinophils Relative: 13 %
HCT: 41.5 % (ref 36.0–46.0)
Hemoglobin: 13.1 g/dL (ref 12.0–15.0)
Lymphocytes Relative: 40 %
Lymphs Abs: 5.3 10*3/uL — ABNORMAL HIGH (ref 0.7–4.0)
MCH: 24.3 pg — ABNORMAL LOW (ref 26.0–34.0)
MCHC: 31.6 g/dL (ref 30.0–36.0)
MCV: 77 fL — ABNORMAL LOW (ref 80.0–100.0)
Monocytes Absolute: 1.2 10*3/uL — ABNORMAL HIGH (ref 0.1–1.0)
Monocytes Relative: 9 %
Neutro Abs: 4.9 10*3/uL (ref 1.7–7.7)
Neutrophils Relative %: 37 %
Platelet Count: 218 10*3/uL (ref 150–400)
RBC: 5.39 MIL/uL — ABNORMAL HIGH (ref 3.87–5.11)
RDW: 14.8 % (ref 11.5–15.5)
WBC Count: 13.3 10*3/uL — ABNORMAL HIGH (ref 4.0–10.5)
nRBC: 0 % (ref 0.0–0.2)

## 2019-12-30 LAB — CMP (CANCER CENTER ONLY)
ALT: 24 U/L (ref 0–44)
AST: 22 U/L (ref 15–41)
Albumin: 4.5 g/dL (ref 3.5–5.0)
Alkaline Phosphatase: 62 U/L (ref 38–126)
Anion gap: 7 (ref 5–15)
BUN: 14 mg/dL (ref 8–23)
CO2: 32 mmol/L (ref 22–32)
Calcium: 10 mg/dL (ref 8.9–10.3)
Chloride: 101 mmol/L (ref 98–111)
Creatinine: 0.74 mg/dL (ref 0.44–1.00)
GFR, Est AFR Am: >60 mL/min (ref >60)
GFR, Estimated: >60 mL/min (ref >60)
Glucose, Bld: 169 mg/dL — ABNORMAL HIGH (ref 70–99)
Potassium: 3.3 mmol/L — ABNORMAL LOW (ref 3.5–5.1)
Sodium: 140 mmol/L (ref 135–145)
Total Bilirubin: 0.6 mg/dL (ref 0.3–1.2)
Total Protein: 7.3 g/dL (ref 6.5–8.1)

## 2019-12-30 NOTE — Progress Notes (Signed)
Hematology and Oncology Follow Up Visit  Valerie Cline [id number] 1956-04-14 64 y.o. 12/30/2019   Principle Diagnosis:   Small lymphocytic B cell non-Hodgkin lymphoma  Current Therapy:    Observation     Interim History:  Valerie Cline is back for second office visit.  We first saw her right before New Year's.  It was apparent that she had some form of lymphoproliferative process.  We were trying to get a biopsy on her.  However she developed the coronavirus.  She has recovered from this.  Shockingly, we did her staging studies.  She had a PET scan done.  The PET scan surprisingly showed that she had a "positive response to therapy" even though she never had had treatment.  She had decrease in her lymphadenopathy.  I have to believe that the coronavirus somehow activated her immune system and this certainly helped with her lymphadenopathy.  She ultimately had a lymph node biopsy.  This was in the left neck.  This was done on 12/20/2019.  The pathology report ([id number]) showed small lymphocytic lymphoma.  It was CD 20+.  It was CD5 positive.  It was CD10 negative.  She feels well right now.  She has no complaints.  She has had no nausea or vomiting.  She has had no swollen lymph nodes.  There is been no fever.  She has had no change in bowel or bladder habits.  Overall, I would say her performance status is ECOG 0.  Medications:  Current Outpatient Medications:  .  amLODipine (NORVASC) 2.5 MG tablet, Take 2.5 mg by mouth at bedtime., Disp: , Rfl:  .  aspirin EC 81 MG tablet, Take 81 mg by mouth at bedtime., Disp: , Rfl:  .  benazepril (LOTENSIN) 20 MG tablet, Take 20 mg by mouth at bedtime. , Disp: , Rfl:  .  Dulaglutide (TRULICITY) 1.5 HY/8.5OY SOPN, Inject 1.5 mg into the skin every Tuesday. , Disp: , Rfl:  .  hydrochlorothiazide 25 MG tablet, Take 25 mg by mouth at bedtime. , Disp: , Rfl:  .  Icosapent Ethyl (VASCEPA) 0.5 g CAPS, Take 0.5 mg by mouth daily., Disp: , Rfl:  .   insulin regular human CONCENTRATED (HUMULIN R) 500 UNIT/ML SOLN injection, Inject 50-70 Units into the skin See admin instructions. Inject 70 units subcutaneously before breakfast, and 50 units before lunch and supper - drawn on a U-500 syringe, Disp: , Rfl:  .  rosuvastatin (CRESTOR) 10 MG tablet, Take 10 mg by mouth at bedtime., Disp: , Rfl:   Current Facility-Administered Medications:  .  0.9 %  sodium chloride infusion, 500 mL, Intravenous, Continuous, Irene Shipper, MD  Allergies: No Known Allergies  Past Medical History, Surgical history, Social history, and Family History were reviewed and updated.  Review of Systems: Review of Systems  Constitutional: Negative.   HENT:  Negative.   Eyes: Negative.   Respiratory: Negative.   Cardiovascular: Negative.   Gastrointestinal: Negative.   Endocrine: Negative.   Genitourinary: Negative.    Musculoskeletal: Negative.   Skin: Negative.   Neurological: Negative.   Hematological: Negative.   Psychiatric/Behavioral: Negative.     Physical Exam:  weight is 176 lb (79.8 kg). Her temporal temperature is 97.3 F (36.3 C) (abnormal). Her blood pressure is 133/57 (abnormal) and her pulse is 77. Her respiration is 18.   Wt Readings from Last 3 Encounters:  12/30/19 176 lb (79.8 kg)  11/25/19 171 lb (77.6 kg)  10/28/19 181 lb (82.1 kg)  Physical Exam Vitals reviewed.  HENT:     Head: Normocephalic and atraumatic.  Eyes:     Pupils: Pupils are equal, round, and reactive to light.  Cardiovascular:     Rate and Rhythm: Normal rate and regular rhythm.     Heart sounds: Normal heart sounds.  Pulmonary:     Effort: Pulmonary effort is normal.     Breath sounds: Normal breath sounds.  Abdominal:     General: Bowel sounds are normal.     Palpations: Abdomen is soft.  Musculoskeletal:        General: No tenderness or deformity. Normal range of motion.     Cervical back: Normal range of motion.  Lymphadenopathy:     Cervical: No  cervical adenopathy.  Skin:    General: Skin is warm and dry.     Findings: No erythema or rash.  Neurological:     Mental Status: She is alert and oriented to person, place, and time.  Psychiatric:        Behavior: Behavior normal.        Thought Content: Thought content normal.        Judgment: Judgment normal.      Lab Results  Component Value Date   WBC 13.3 (H) 12/30/2019   HGB 13.1 12/30/2019   HCT 41.5 12/30/2019   MCV 77.0 (L) 12/30/2019   PLT 218 12/30/2019     Chemistry      Component Value Date/Time   NA 140 12/30/2019 1455   K 3.3 (L) 12/30/2019 1455   CL 101 12/30/2019 1455   CO2 32 12/30/2019 1455   BUN 14 12/30/2019 1455   CREATININE 0.74 12/30/2019 1455      Component Value Date/Time   CALCIUM 10.0 12/30/2019 1455   ALKPHOS 62 12/30/2019 1455   AST 22 12/30/2019 1455   ALT 24 12/30/2019 1455   BILITOT 0.6 12/30/2019 1455      Impression and Plan: Valerie Cline is a 64 year old white female.  She has small lymphocytic B cell non-Hodgkin's lymphoma.  For right now, I really do not think we have to treat her.  Her lymphoma certainly is not progressing.  Again, I have to believe that the coronavirus somehow activated her immune system and this has controlled it nicely.  If we ever had to treat her, I probably would use Rituxan/bendamustine.  I think we should repeat a CT scan in April.  This would be about 3 months after she has had the PET scan.  Her actual CT scan was done back in December.  I suspect that we probably will need to treat her at some point.  Since she is pretty much asymptomatic right now, I just do not see that we are going to impact her longevity by treating her right now.  I will plan to have her come back in April.  We will do the CT scans the same day that I see her.  I spent about 40 minutes with her.  I reviewed her PET scan.  I reviewed her pathology report.  I answered all of her questions.   Volanda Napoleon,  MD 3/1/20214:10 PM

## 2019-12-30 NOTE — Progress Notes (Signed)
Visited with patient at her follow up after biopsy. She has been given her path results. She is relieved to finally have an answer after so many delays.  Speaking with Dr Marin Olp prior to the appointment, he believes that patient will just require observation at this time. I told patient I would review his appointment notes tomorrow, and help out in any way which was needed if the plan of care changed.   I provided patient with information on Cancer, CLL and the Leukemia and Lymphoma [employer], letting her know that the foundation may be a source of financial aid, if/when she started active treatment.

## 2019-12-31 LAB — BETA 2 MICROGLOBULIN, SERUM: Beta-2 Microglobulin: 2.8 mg/L — ABNORMAL HIGH (ref 0.6–2.4)

## 2019-12-31 LAB — IGG, IGA, IGM
IgA: 178 mg/dL (ref 87–352)
IgG (Immunoglobin G), Serum: 787 mg/dL (ref 586–1602)
IgM (Immunoglobulin M), Srm: 330 mg/dL — ABNORMAL HIGH (ref 26–217)

## 2019-12-31 LAB — KAPPA/LAMBDA LIGHT CHAINS
Kappa free light chain: 131.9 mg/L — ABNORMAL HIGH (ref 3.3–19.4)
Kappa, lambda light chain ratio: 4.43 — ABNORMAL HIGH (ref 0.26–1.65)
Lambda free light chains: 29.8 mg/L — ABNORMAL HIGH (ref 5.7–26.3)

## 2020-01-01 LAB — PROTEIN ELECTROPHORESIS, SERUM, WITH REFLEX
A/G Ratio: 1.4 (ref 0.7–1.7)
Albumin ELP: 3.8 g/dL (ref 2.9–4.4)
Alpha-1-Globulin: 0.2 g/dL (ref 0.0–0.4)
Alpha-2-Globulin: 0.7 g/dL (ref 0.4–1.0)
Beta Globulin: 0.9 g/dL (ref 0.7–1.3)
Gamma Globulin: 1 g/dL (ref 0.4–1.8)
Globulin, Total: 2.8 g/dL (ref 2.2–3.9)
Total Protein ELP: 6.6 g/dL (ref 6.0–8.5)

## 2020-01-13 DIAGNOSIS — I700 Atherosclerosis of aorta: Secondary | ICD-10-CM

## 2020-01-13 DIAGNOSIS — I2510 Atherosclerotic heart disease of native coronary artery without angina pectoris: Secondary | ICD-10-CM

## 2020-01-13 HISTORY — DX: Atherosclerotic heart disease of native coronary artery without angina pectoris: I25.10

## 2020-01-13 HISTORY — DX: Atherosclerosis of aorta: I70.0

## 2020-01-15 ENCOUNTER — Other Ambulatory Visit: Payer: Self-pay | Admitting: Internal Medicine

## 2020-01-15 ENCOUNTER — Other Ambulatory Visit: Payer: Self-pay | Admitting: Gastroenterology

## 2020-01-15 DIAGNOSIS — E049 Nontoxic goiter, unspecified: Secondary | ICD-10-CM

## 2020-01-27 ENCOUNTER — Ambulatory Visit
Admission: RE | Admit: 2020-01-27 | Discharge: 2020-01-27 | Disposition: A | Discharge status: Home / Self Care | Payer: Medicare Other | Source: Ambulatory Visit | Attending: Internal Medicine | Admitting: Internal Medicine

## 2020-01-27 DIAGNOSIS — E049 Nontoxic goiter, unspecified: Secondary | ICD-10-CM

## 2020-01-30 ENCOUNTER — Other Ambulatory Visit: Payer: Self-pay | Admitting: Internal Medicine

## 2020-01-30 DIAGNOSIS — E041 Nontoxic single thyroid nodule: Secondary | ICD-10-CM

## 2020-02-03 ENCOUNTER — Telehealth: Payer: Self-pay | Admitting: Hematology & Oncology

## 2020-02-03 ENCOUNTER — Other Ambulatory Visit: Payer: Self-pay

## 2020-02-03 ENCOUNTER — Inpatient Hospital Stay: Payer: Medicare Other

## 2020-02-03 ENCOUNTER — Ambulatory Visit (HOSPITAL_BASED_OUTPATIENT_CLINIC_OR_DEPARTMENT_OTHER)
Admission: RE | Admit: 2020-02-03 | Discharge: 2020-02-03 | Disposition: A | Discharge status: Home / Self Care | Payer: Medicare Other | Source: Ambulatory Visit | Attending: Hematology & Oncology | Admitting: Hematology & Oncology

## 2020-02-03 ENCOUNTER — Inpatient Hospital Stay: Payer: Medicare Other | Attending: Hematology & Oncology | Admitting: Hematology & Oncology

## 2020-02-03 ENCOUNTER — Other Ambulatory Visit: Payer: Self-pay | Admitting: Hematology & Oncology

## 2020-02-03 VITALS — BP 154/58 | HR 84 | Temp 97.5°F | Resp 18 | Wt 182.8 lb

## 2020-02-03 DIAGNOSIS — R197 Diarrhea, unspecified: Secondary | ICD-10-CM | POA: Diagnosis not present

## 2020-02-03 DIAGNOSIS — Z7982 Long term (current) use of aspirin: Secondary | ICD-10-CM | POA: Insufficient documentation

## 2020-02-03 DIAGNOSIS — Z5112 Encounter for antineoplastic immunotherapy: Secondary | ICD-10-CM | POA: Insufficient documentation

## 2020-02-03 DIAGNOSIS — Z5111 Encounter for antineoplastic chemotherapy: Secondary | ICD-10-CM | POA: Insufficient documentation

## 2020-02-03 DIAGNOSIS — C8300 Small cell B-cell lymphoma, unspecified site: Secondary | ICD-10-CM | POA: Insufficient documentation

## 2020-02-03 DIAGNOSIS — Z79899 Other long term (current) drug therapy: Secondary | ICD-10-CM | POA: Diagnosis not present

## 2020-02-03 DIAGNOSIS — R590 Localized enlarged lymph nodes: Secondary | ICD-10-CM | POA: Diagnosis present

## 2020-02-03 DIAGNOSIS — Z794 Long term (current) use of insulin: Secondary | ICD-10-CM | POA: Diagnosis not present

## 2020-02-03 DIAGNOSIS — C8308 Small cell B-cell lymphoma, lymph nodes of multiple sites: Secondary | ICD-10-CM

## 2020-02-03 LAB — CBC WITH DIFFERENTIAL (CANCER CENTER ONLY)
Abs Immature Granulocytes: 0.03 10*3/uL (ref 0.00–0.07)
Basophils Absolute: 0.1 10*3/uL (ref 0.0–0.1)
Basophils Relative: 1 %
Eosinophils Absolute: 1.4 10*3/uL — ABNORMAL HIGH (ref 0.0–0.5)
Eosinophils Relative: 11 %
HCT: 40.6 % (ref 36.0–46.0)
Hemoglobin: 12.7 g/dL (ref 12.0–15.0)
Immature Granulocytes: 0 %
Lymphocytes Relative: 47 %
Lymphs Abs: 6.1 10*3/uL — ABNORMAL HIGH (ref 0.7–4.0)
MCH: 24.2 pg — ABNORMAL LOW (ref 26.0–34.0)
MCHC: 31.3 g/dL (ref 30.0–36.0)
MCV: 77.5 fL — ABNORMAL LOW (ref 80.0–100.0)
Monocytes Absolute: 1.1 10*3/uL — ABNORMAL HIGH (ref 0.1–1.0)
Monocytes Relative: 8 %
Neutro Abs: 4.2 10*3/uL (ref 1.7–7.7)
Neutrophils Relative %: 33 %
Platelet Count: 243 10*3/uL (ref 150–400)
RBC: 5.24 MIL/uL — ABNORMAL HIGH (ref 3.87–5.11)
RDW: 14.5 % (ref 11.5–15.5)
WBC Count: 12.9 10*3/uL — ABNORMAL HIGH (ref 4.0–10.5)
nRBC: 0 % (ref 0.0–0.2)

## 2020-02-03 LAB — CMP (CANCER CENTER ONLY)
ALT: 25 U/L (ref 0–44)
AST: 25 U/L (ref 15–41)
Albumin: 4.3 g/dL (ref 3.5–5.0)
Alkaline Phosphatase: 61 U/L (ref 38–126)
Anion gap: 8 (ref 5–15)
BUN: 13 mg/dL (ref 8–23)
CO2: 32 mmol/L (ref 22–32)
Calcium: 9.6 mg/dL (ref 8.9–10.3)
Chloride: 101 mmol/L (ref 98–111)
Creatinine: 0.63 mg/dL (ref 0.44–1.00)
GFR, Est AFR Am: >60 mL/min (ref >60)
GFR, Estimated: >60 mL/min (ref >60)
Glucose, Bld: 142 mg/dL — ABNORMAL HIGH (ref 70–99)
Potassium: 3.5 mmol/L (ref 3.5–5.1)
Sodium: 141 mmol/L (ref 135–145)
Total Bilirubin: 0.6 mg/dL (ref 0.3–1.2)
Total Protein: 6.8 g/dL (ref 6.5–8.1)

## 2020-02-03 LAB — LACTATE DEHYDROGENASE: LDH: 272 U/L — ABNORMAL HIGH (ref 98–192)

## 2020-02-03 MED ORDER — ALLOPURINOL 100 MG PO TABS: 100.0000 mg | ORAL_TABLET | Freq: Every day | ORAL | 0 refills | Status: DC

## 2020-02-03 MED ORDER — FAMCICLOVIR 500 MG PO TABS: 500.0000 mg | ORAL_TABLET | Freq: Every day | ORAL | 8 refills | Status: DC

## 2020-02-03 MED ORDER — IOHEXOL 300 MG/ML  SOLN
100.0000 mL | Freq: Once | INTRAMUSCULAR | Status: AC | PRN
  Administered 2020-02-03: 100 mL via INTRAVENOUS

## 2020-02-03 NOTE — Telephone Encounter (Signed)
Appointments scheduled calendar printed per 4/5 los

## 2020-02-03 NOTE — Progress Notes (Signed)
Hematology and Oncology Follow Up Visit  Valerie Cline [id number] 1956-10-07 64 y.o. 02/03/2020   Principle Diagnosis:   Small lymphocytic B cell non-Hodgkin lymphoma -- progressive  Current Therapy:    Rituxan/Bendamustine -- start cycle #1 on 02/13/2020     Interim History:  Valerie Cline is back for a follow-up.  Unfortunately, she had a CT scan done today.  The CT scan clearly showed that her lymphoma is now progressing significantly.  The CT scan showed multiple areas of lymph node enlargement.  The biggest problem was down her retroperitoneum where the lymph node now measures 13 x 5.7 cm.  It previously measured 9.3 x 3.6 cm.  I really think that we are going to have to get treatment started on her.  She is having some diarrhea.  She is having no abdominal pain.  There is no nausea or vomiting.  She has had no cough.  There is no shortness of breath.  She does have some swollen lymph nodes on her neck.  She has some swollen lymph nodes in her axilla.  I think that we probably need to get started with treatment with Rituxan/bendamustine.  I think this would be a very reasonable option for her.  Currently, I would have to say that her performance status is ECOG 0.    Medications:  Current Outpatient Medications:  .  amLODipine (NORVASC) 2.5 MG tablet, Take 2.5 mg by mouth at bedtime., Disp: , Rfl:  .  aspirin EC 81 MG tablet, Take 81 mg by mouth at bedtime., Disp: , Rfl:  .  benazepril (LOTENSIN) 20 MG tablet, Take 20 mg by mouth at bedtime. , Disp: , Rfl:  .  Dulaglutide (TRULICITY) 1.5 QQ/5.9DG SOPN, Inject 1.5 mg into the skin every Tuesday. , Disp: , Rfl:  .  hydrochlorothiazide 25 MG tablet, Take 25 mg by mouth at bedtime. , Disp: , Rfl:  .  insulin regular human CONCENTRATED (HUMULIN R) 500 UNIT/ML SOLN injection, Inject 50-70 Units into the skin See admin instructions. Inject 70 units subcutaneously before breakfast, and 50 units before lunch and supper - drawn on a U-500  syringe, Disp: , Rfl:  .  rosuvastatin (CRESTOR) 10 MG tablet, Take 10 mg by mouth at bedtime., Disp: , Rfl:   Current Facility-Administered Medications:  .  0.9 %  sodium chloride infusion, 500 mL, Intravenous, Continuous, Irene Shipper, MD  Allergies: No Known Allergies  Past Medical History, Surgical history, Social history, and Family History were reviewed and updated.  Review of Systems: Review of Systems  Constitutional: Negative.   HENT:  Negative.   Eyes: Negative.   Respiratory: Negative.   Cardiovascular: Negative.   Gastrointestinal: Negative.   Endocrine: Negative.   Genitourinary: Negative.    Musculoskeletal: Negative.   Skin: Negative.   Neurological: Negative.   Hematological: Negative.   Psychiatric/Behavioral: Negative.     Physical Exam:  weight is 182 lb 12 oz (82.9 kg). Her temporal temperature is 97.5 F (36.4 C) (abnormal). Her blood pressure is 154/58 (abnormal) and her pulse is 84. Her respiration is 18 and oxygen saturation is 97%.   Wt Readings from Last 3 Encounters:  02/03/20 182 lb 12 oz (82.9 kg)  12/30/19 176 lb (79.8 kg)  11/25/19 171 lb (77.6 kg)    Physical Exam Vitals reviewed.  HENT:     Head: Normocephalic and atraumatic.  Eyes:     Pupils: Pupils are equal, round, and reactive to light.  Cardiovascular:  Rate and Rhythm: Normal rate and regular rhythm.     Heart sounds: Normal heart sounds.  Pulmonary:     Effort: Pulmonary effort is normal.     Breath sounds: Normal breath sounds.  Abdominal:     General: Bowel sounds are normal.     Palpations: Abdomen is soft.  Musculoskeletal:        General: No tenderness or deformity. Normal range of motion.     Cervical back: Normal range of motion.  Lymphadenopathy:     Cervical: No cervical adenopathy.  Skin:    General: Skin is warm and dry.     Findings: No erythema or rash.  Neurological:     Mental Status: She is alert and oriented to person, place, and time.    Psychiatric:        Behavior: Behavior normal.        Thought Content: Thought content normal.        Judgment: Judgment normal.      Lab Results  Component Value Date   WBC 12.9 (H) 02/03/2020   HGB 12.7 02/03/2020   HCT 40.6 02/03/2020   MCV 77.5 (L) 02/03/2020   PLT 243 02/03/2020     Chemistry      Component Value Date/Time   NA 141 02/03/2020 1251   K 3.5 02/03/2020 1251   CL 101 02/03/2020 1251   CO2 32 02/03/2020 1251   BUN 13 02/03/2020 1251   CREATININE 0.63 02/03/2020 1251      Component Value Date/Time   CALCIUM 9.6 02/03/2020 1251   ALKPHOS 61 02/03/2020 1251   AST 25 02/03/2020 1251   ALT 25 02/03/2020 1251   BILITOT 0.6 02/03/2020 1251      Impression and Plan: Valerie Cline is a 64 year old white female.  She has small lymphocytic B cell non-Hodgkin's lymphoma.  Again, I think that we are going to have to get started on treatment.  I believe that Rituxan/bendamustine would be a very good option for her.  I think we can elicit a very quick response.  I worry that the lymphadenopathy in her abdomen is going to cause problems for her.  I talked her at length about starting treatment.  I explained why I thought we needed to start treatment.  I reviewed her CAT scans with her.  She has excellent peripheral venous access.  We will not need to have a Port-A-Cath placed.  I went over the toxicities of Rituxan bendamustine.  I really think that she is going to do okay on this.  I explained about the side effects of Rituxan that people have when the Rituxan is infusing and.  I explained her that the first treatment of Rituxan will be about 6 hours long.  I explained to her that treatment is only 2 days once a month.  I will put her on Famvir and allopurinol.  We will get started with treatment on April 15.  I will plan for follow-up CT scan after her second cycle of treatment.  I think we do not need any PET scans on her.  I think a CT scan is all that we  will need.  I spent over 45 minutes with her today.  This was somewhat complicated and pretty intensive given that she now has progressive disease and we have to start treatment.    Volanda Napoleon, MD 4/5/20211:44 PM

## 2020-02-03 NOTE — Progress Notes (Signed)
START OFF PATHWAY REGIMEN - Lymphoma and CLL   [id number]:Bendamustine 90 mg/m2 IV D1,2 + Rituximab (IV/SUBQ) D1 q28 Days x 6 Cycles:   Cycle 1: A cycle is 28 days:     Rituximab-xxxx      Bendamustine    Cycles 2 through 6: A cycle is every 28 days:     Rituximab and hyaluronidase human      Bendamustine   **Always confirm dose/schedule in your pharmacy ordering system**  Patient Characteristics: Small Lymphocytic Lymphoma (SLL), First Line, Treatment Indicated, 17p del(-)/Unknown and ATM Mutation Negative/Unknown and TP53 Mutation Negative/Unknown, Age ? 53 or Frail (Any Age) Disease Type: Small Lymphocytic Lymphoma (SLL) Disease Type: Not Applicable Disease Type: Not Applicable [place] Stage: III Line of Therapy: First Line Treatment Indicated<= Treatment Indicated ATM Mutation Status: Unknown 17p Deletion Status: Unknown TP53 Mutation Status: Unknown Patient Age: ? 67 Patient Condition: Fit Patient Intent of Therapy: Non-Curative / Palliative Intent, Discussed with Patient

## 2020-02-04 ENCOUNTER — Encounter: Payer: Self-pay | Admitting: *Deleted

## 2020-02-04 NOTE — Progress Notes (Signed)
Patient was seen in the office yesterday, and plan was to proceed with treatment. Called patient and scheduled Chemo Education and Nutrition Consult with patient for next Tuesday. Answered some preliminary questions regarding treatment for patient. She knows to call the office if she has any further needs or concerns.

## 2020-02-05 ENCOUNTER — Other Ambulatory Visit (HOSPITAL_COMMUNITY)
Admission: RE | Admit: 2020-02-05 | Discharge: 2020-02-05 | Disposition: A | Discharge status: Home / Self Care | Payer: Medicare Other | Source: Ambulatory Visit | Attending: Internal Medicine | Admitting: Internal Medicine

## 2020-02-05 ENCOUNTER — Ambulatory Visit
Admission: RE | Admit: 2020-02-05 | Discharge: 2020-02-05 | Disposition: A | Discharge status: Home / Self Care | Payer: Medicare Other | Source: Ambulatory Visit | Attending: Internal Medicine | Admitting: Internal Medicine

## 2020-02-05 DIAGNOSIS — E041 Nontoxic single thyroid nodule: Secondary | ICD-10-CM | POA: Insufficient documentation

## 2020-02-05 LAB — BETA 2 MICROGLOBULIN, SERUM: Beta-2 Microglobulin: 2.9 mg/L — ABNORMAL HIGH (ref 0.6–2.4)

## 2020-02-06 LAB — CYTOLOGY - NON PAP

## 2020-02-06 NOTE — Progress Notes (Signed)
Pharmacist Chemotherapy Monitoring - Initial Assessment    Anticipated start date: 02/13/20   Regimen:  . Are orders appropriate based on the patient's diagnosis, regimen, and cycle? Yes . Does the plan date match the patient's scheduled date? Yes . Is the sequencing of drugs appropriate? Yes . Are the premedications appropriate for the patient's regimen? Yes . Prior Authorization for treatment is: Pending o If applicable, is the correct biosimilar selected based on the patient's insurance? yes  Organ Function and Labs: Marland Kitchen Are dose adjustments needed based on the patient's renal function, hepatic function, or hematologic function? No . Are appropriate labs ordered prior to the start of patient's treatment? Yes . Other organ system assessment, if indicated: N/A . The following baseline labs, if indicated, have been ordered: rituximab: baseline Hepatitis B labs  Dose Assessment: . Are the drug doses appropriate? Yes . Are the following correct: o Drug concentrations Yes o IV fluid compatible with drug Yes o Administration routes Yes o Timing of therapy Yes . If applicable, does the patient have documented access for treatment and/or plans for port-a-cath placement? yes . If applicable, have lifetime cumulative doses been properly documented and assessed? not applicable Lifetime Dose Tracking  No doses have been documented on this patient for the following tracked chemicals: Doxorubicin, Epirubicin, Idarubicin, Daunorubicin, Mitoxantrone, Bleomycin, Oxaliplatin, Carboplatin, Liposomal Doxorubicin  o   Toxicity Monitoring/Prevention: . The patient has the following take home antiemetics prescribed: Ondansetron, dexamethasone, prochlorperazine, lorazepam . The patient has the following take home medications prescribed: N/A Allopurinol, Famvir . Medication allergies and previous infusion related reactions, if applicable, have been reviewed and addressed. Yes . The patient's current  medication list has been assessed for drug-drug interactions with their chemotherapy regimen. no significant drug-drug interactions were identified on review.  Order Review: . Are the treatment plan orders signed? Yes . Is the patient scheduled to see a provider prior to their treatment? No  I verify that I have reviewed each item in the above checklist and answered each question accordingly.  Valerie Cline, [name] 02/06/2020 7:47 AM

## 2020-02-10 ENCOUNTER — Encounter: Payer: Self-pay | Admitting: *Deleted

## 2020-02-10 NOTE — Progress Notes (Signed)
Patient is calling for appointment clarification for tomorrow's nutrition and education class. Times and location provided.  Patient also states she received her prescriptions for famvir and allopurinol. She wants to make sure she can go ahead and start these. Told her to start making these medications.

## 2020-02-11 ENCOUNTER — Inpatient Hospital Stay: Payer: Medicare Other | Admitting: Nutrition

## 2020-02-11 ENCOUNTER — Encounter: Payer: Self-pay | Admitting: *Deleted

## 2020-02-11 ENCOUNTER — Inpatient Hospital Stay: Payer: Medicare Other

## 2020-02-11 ENCOUNTER — Other Ambulatory Visit: Payer: Self-pay

## 2020-02-11 ENCOUNTER — Other Ambulatory Visit: Payer: Self-pay | Admitting: *Deleted

## 2020-02-11 DIAGNOSIS — C8308 Small cell B-cell lymphoma, lymph nodes of multiple sites: Secondary | ICD-10-CM

## 2020-02-11 MED ORDER — ONDANSETRON HCL 8 MG PO TABS: 8.0000 mg | ORAL_TABLET | Freq: Two times a day (BID) | ORAL | 1 refills | Status: DC | PRN

## 2020-02-11 MED ORDER — LORAZEPAM 0.5 MG PO TABS: 0.5000 mg | ORAL_TABLET | Freq: Four times a day (QID) | ORAL | 0 refills | Status: DC | PRN

## 2020-02-11 MED ORDER — PROCHLORPERAZINE MALEATE 10 MG PO TABS: 10.0000 mg | ORAL_TABLET | Freq: Four times a day (QID) | ORAL | 1 refills | Status: DC | PRN

## 2020-02-11 NOTE — Progress Notes (Signed)
Patient in chemotherapy education class with  Rituxan, Verl Dicker.  Discussed side effects of  which include but are not limited to myelosuppression, decreased appetite, fatigue, fever, allergic or infusional reaction, mucositis, cardiac toxicity, cough, SOB, altered taste, nausea and vomiting, diarrhea, constipation, elevated LFTs myalgia and arthralgias, hair loss or thinning, rash, skin dryness, nail changes, peripheral neuropathy, discolored urine, delayed wound healing, mental changes (Chemo brain), increased risk of infections, weight loss.  Reviewed infusion room and office policy and procedure and phone numbers 24 hours x 7 days a week.  Reviewed when to call the office with any concerns or problems.  Scientist, clinical (histocompatibility and immunogenetics) given.   Antiemetic protocol and chemotherapy schedule reviewed. Patient verbalized understanding of chemotherapy indications and possible side effects.  Teachback done

## 2020-02-11 NOTE — Progress Notes (Signed)
64 year old female diagnosed with B cell Lymphoma. She is a patient of Dr. Marin Olp.  PMH includes HTN, HLD, and DM.  Medications include Insulin.  Labs were reviewed.  Height: 5\' 6" . Weight: 179 pounds April 5. UBW: 180 lb. BMI: 28.89  Patient reports occasional diarrhea. Eats 3 meals with snacks. Denies weight loss and other nutrition impact symptoms. Encouraged weight maintenance.  Nutrition Diagnosis: Food and Nutrition Related Knowledge Deficit related to new diagnosis of B cell Lymphoma as evidenced by no prior need for nutrition information  Intervention: Educated patient to consume small, frequent meals and snacks with adequate calories and protein. Reviewed high protein foods. Provided fact sheets and contact information.  Monitoring, Evaluation, Goals: Patient will tolerate adequate calories and protein to minimize weight loss.  Next Visit: To be scheduled as needed.

## 2020-02-12 MED ORDER — LORAZEPAM 0.5 MG PO TABS: 0.5000 mg | ORAL_TABLET | Freq: Four times a day (QID) | ORAL | 0 refills | Status: DC | PRN

## 2020-02-13 ENCOUNTER — Inpatient Hospital Stay: Payer: Medicare Other

## 2020-02-13 ENCOUNTER — Encounter: Payer: Self-pay | Admitting: *Deleted

## 2020-02-13 ENCOUNTER — Other Ambulatory Visit: Payer: Self-pay

## 2020-02-13 VITALS — BP 125/48 | HR 90 | Temp 98.4°F | Resp 16

## 2020-02-13 DIAGNOSIS — Z5112 Encounter for antineoplastic immunotherapy: Secondary | ICD-10-CM | POA: Diagnosis not present

## 2020-02-13 DIAGNOSIS — C8308 Small cell B-cell lymphoma, lymph nodes of multiple sites: Secondary | ICD-10-CM

## 2020-02-13 LAB — CBC WITH DIFFERENTIAL (CANCER CENTER ONLY)
Abs Immature Granulocytes: 0.02 10*3/uL (ref 0.00–0.07)
Basophils Absolute: 0.1 10*3/uL (ref 0.0–0.1)
Basophils Relative: 1 %
Eosinophils Absolute: 1.4 10*3/uL — ABNORMAL HIGH (ref 0.0–0.5)
Eosinophils Relative: 13 %
HCT: 38.6 % (ref 36.0–46.0)
Hemoglobin: 12.4 g/dL (ref 12.0–15.0)
Immature Granulocytes: 0 %
Lymphocytes Relative: 37 %
Lymphs Abs: 4 10*3/uL (ref 0.7–4.0)
MCH: 24.6 pg — ABNORMAL LOW (ref 26.0–34.0)
MCHC: 32.1 g/dL (ref 30.0–36.0)
MCV: 76.6 fL — ABNORMAL LOW (ref 80.0–100.0)
Monocytes Absolute: 1.5 10*3/uL — ABNORMAL HIGH (ref 0.1–1.0)
Monocytes Relative: 14 %
Neutro Abs: 3.7 10*3/uL (ref 1.7–7.7)
Neutrophils Relative %: 35 %
Platelet Count: 223 10*3/uL (ref 150–400)
RBC: 5.04 MIL/uL (ref 3.87–5.11)
RDW: 14.3 % (ref 11.5–15.5)
WBC Count: 10.7 10*3/uL — ABNORMAL HIGH (ref 4.0–10.5)
nRBC: 0 % (ref 0.0–0.2)

## 2020-02-13 LAB — CMP (CANCER CENTER ONLY)
ALT: 24 U/L (ref 0–44)
AST: 25 U/L (ref 15–41)
Albumin: 4.1 g/dL (ref 3.5–5.0)
Alkaline Phosphatase: 57 U/L (ref 38–126)
Anion gap: 10 (ref 5–15)
BUN: 15 mg/dL (ref 8–23)
CO2: 31 mmol/L (ref 22–32)
Calcium: 9.4 mg/dL (ref 8.9–10.3)
Chloride: 100 mmol/L (ref 98–111)
Creatinine: 0.68 mg/dL (ref 0.44–1.00)
GFR, Est AFR Am: >60 mL/min (ref >60)
GFR, Estimated: >60 mL/min (ref >60)
Glucose, Bld: 287 mg/dL — ABNORMAL HIGH (ref 70–99)
Potassium: 3.3 mmol/L — ABNORMAL LOW (ref 3.5–5.1)
Sodium: 141 mmol/L (ref 135–145)
Total Bilirubin: 0.7 mg/dL (ref 0.3–1.2)
Total Protein: 6.4 g/dL — ABNORMAL LOW (ref 6.5–8.1)

## 2020-02-13 LAB — HEPATITIS PANEL, ACUTE
HCV Ab: NONREACTIVE
Hep A IgM: NONREACTIVE
Hep B C IgM: NONREACTIVE
Hepatitis B Surface Ag: NONREACTIVE

## 2020-02-13 LAB — HEPATITIS B SURFACE ANTIGEN: Hepatitis B Surface Ag: NONREACTIVE

## 2020-02-13 LAB — HEPATITIS B CORE ANTIBODY, TOTAL: Hep B Core Total Ab: NONREACTIVE

## 2020-02-13 LAB — URIC ACID: Uric Acid, Serum: 6 mg/dL (ref 2.5–7.1)

## 2020-02-13 MED ORDER — SODIUM CHLORIDE 0.9 % IV SOLN
Freq: Once | INTRAVENOUS | Status: AC
  Filled 2020-02-13: qty 250

## 2020-02-13 MED ORDER — SODIUM CHLORIDE 0.9 % IV SOLN
375.0000 mg/m2 | Freq: Once | INTRAVENOUS | Status: AC
  Administered 2020-02-13: 700 mg via INTRAVENOUS
  Filled 2020-02-13: qty 20

## 2020-02-13 MED ORDER — PALONOSETRON HCL INJECTION 0.25 MG/5ML
0.2500 mg | Freq: Once | INTRAVENOUS | Status: AC
  Administered 2020-02-13: 0.25 mg via INTRAVENOUS

## 2020-02-13 MED ORDER — SODIUM CHLORIDE 0.9 % IV SOLN
90.0000 mg/m2 | Freq: Once | INTRAVENOUS | Status: AC
  Administered 2020-02-13: 13:00:00 175 mg via INTRAVENOUS
  Filled 2020-02-13: qty 7

## 2020-02-13 MED ORDER — ACETAMINOPHEN 325 MG PO TABS
ORAL_TABLET | ORAL | Status: AC
  Filled 2020-02-13: qty 2

## 2020-02-13 MED ORDER — DIPHENHYDRAMINE HCL 25 MG PO CAPS
ORAL_CAPSULE | ORAL | Status: AC
  Filled 2020-02-13: qty 2

## 2020-02-13 MED ORDER — SODIUM CHLORIDE 0.9 % IV SOLN
10.0000 mg | Freq: Once | INTRAVENOUS | Status: AC
  Administered 2020-02-13: 10 mg via INTRAVENOUS
  Filled 2020-02-13: qty 10

## 2020-02-13 MED ORDER — ACETAMINOPHEN 325 MG PO TABS
650.0000 mg | ORAL_TABLET | Freq: Once | ORAL | Status: AC
  Administered 2020-02-13: 650 mg via ORAL

## 2020-02-13 MED ORDER — DIPHENHYDRAMINE HCL 25 MG PO CAPS
50.0000 mg | ORAL_CAPSULE | Freq: Once | ORAL | Status: AC
  Administered 2020-02-13: 50 mg via ORAL

## 2020-02-13 MED ORDER — PALONOSETRON HCL INJECTION 0.25 MG/5ML
INTRAVENOUS | Status: AC
  Filled 2020-02-13: qty 5

## 2020-02-13 NOTE — Patient Instructions (Signed)
Bendamustine Injection What is this medicine? BENDAMUSTINE (BEN da MUS teen) is a chemotherapy drug. It is used to treat chronic lymphocytic leukemia and non-Hodgkin lymphoma. This medicine may be used for other purposes; ask your health care provider or pharmacist if you have questions. COMMON BRAND NAME(S): Kristine Royal, Treanda What should I tell my health care provider before I take this medicine? They need to know if you have any of these conditions:  infection (especially a virus infection such as chickenpox, cold sores, or herpes)  kidney disease  liver disease  an unusual or allergic reaction to bendamustine, mannitol, other medicines, foods, dyes, or preservatives  pregnant or trying to get pregnant  breast-feeding How should I use this medicine? This medicine is for infusion into a vein. It is given by a health care professional in a hospital or clinic setting. Talk to your pediatrician regarding the use of this medicine in children. Special care may be needed. Overdosage: If you think you have taken too much of this medicine contact a poison control center or emergency room at once. NOTE: This medicine is only for you. Do not share this medicine with others. What if I miss a dose? It is important not to miss your dose. Call your doctor or health care professional if you are unable to keep an appointment. What may interact with this medicine? Do not take this medicine with any of the following medications:  clozapine This medicine may also interact with the following medications:  atazanavir  cimetidine  ciprofloxacin  enoxacin  fluvoxamine  medicines for seizures like carbamazepine and phenobarbital  mexiletine  rifampin  tacrine  thiabendazole  zileuton This list may not describe all possible interactions. Give your health care provider a list of all the medicines, herbs, non-prescription drugs, or dietary supplements you use. Also tell them if  you smoke, drink alcohol, or use illegal drugs. Some items may interact with your medicine. What should I watch for while using this medicine? This drug may make you feel generally unwell. This is not uncommon, as chemotherapy can affect healthy cells as well as cancer cells. Report any side effects. Continue your course of treatment even though you feel ill unless your doctor tells you to stop. You may need blood work done while you are taking this medicine. Call your doctor or healthcare provider for advice if you get a fever, chills or sore throat, or other symptoms of a cold or flu. Do not treat yourself. This drug decreases your body's ability to fight infections. Try to avoid being around people who are sick. This medicine may cause serious skin reactions. They can happen weeks to months after starting the medicine. Contact your healthcare provider right away if you notice fevers or flu-like symptoms with a rash. The rash may be red or purple and then turn into blisters or peeling of the skin. Or, you might notice a red rash with swelling of the face, lips or lymph nodes in your neck or under your arms. This medicine may increase your risk to bruise or bleed. Call your doctor or healthcare provider if you notice any unusual bleeding. Talk to your doctor about your risk of cancer. You may be more at risk for certain types of cancers if you take this medicine. Do not become pregnant while taking this medicine or for at least 6 months after stopping it. Women should inform their doctor if they wish to become pregnant or think they might be pregnant. Men should not  father a child while taking this medicine and for at least 3 months after stopping it. There is a potential for serious side effects to an unborn child. Talk to your healthcare provider or pharmacist for more information. Do not breast-feed an infant while taking this medicine or for at least 1 week after stopping it. This medicine may make it  more difficult to father a child. You should talk with your doctor or healthcare provider if you are concerned about your fertility. What side effects may I notice from receiving this medicine? Side effects that you should report to your doctor or health care professional as soon as possible:  allergic reactions like skin rash, itching or hives, swelling of the face, lips, or tongue  low blood counts - this medicine may decrease the number of white blood cells, red blood cells and platelets. You may be at increased risk for infections and bleeding.  rash, fever, and swollen lymph nodes  redness, blistering, peeling, or loosening of the skin, including inside the mouth  signs of infection like fever or chills, cough, sore throat, pain or difficulty passing urine  signs of decreased platelets or bleeding like bruising, pinpoint red spots on the skin, black, tarry stools, blood in the urine  signs of decreased red blood cells like being unusually weak or tired, fainting spells, lightheadedness  signs and symptoms of kidney injury like trouble passing urine or change in the amount of urine  signs and symptoms of liver injury like dark yellow or brown urine; general ill feeling or flu-like symptoms; light-colored stools; loss of appetite; nausea; right upper belly pain; unusually weak or tired; yellowing of the eyes or skin Side effects that usually do not require medical attention (report to your doctor or health care professional if they continue or are bothersome):  constipation  decreased appetite  diarrhea  headache  mouth sores  nausea, vomiting  tiredness This list may not describe all possible side effects. Call your doctor for medical advice about side effects. You may report side effects to FDA at 1-800-FDA-1088. Where should I keep my medicine? This drug is given in a hospital or clinic and will not be stored at home. NOTE: This sheet is a summary. It may not cover all  possible information. If you have questions about this medicine, talk to your doctor, pharmacist, or health care provider.  2020 Elsevier/Gold Standard (2019-01-08 10:26:46) Rituximab injection What is this medicine? RITUXIMAB (ri TUX i mab) is a monoclonal antibody. It is used to treat certain types of cancer like non-Hodgkin lymphoma and chronic lymphocytic leukemia. It is also used to treat rheumatoid arthritis, granulomatosis with polyangiitis (or Wegener's granulomatosis), microscopic polyangiitis, and pemphigus vulgaris. This medicine may be used for other purposes; ask your health care provider or pharmacist if you have questions. COMMON BRAND NAME(S): Rituxan, RUXIENCE What should I tell my health care provider before I take this medicine? They need to know if you have any of these conditions:  heart disease  infection (especially a virus infection such as hepatitis B, chickenpox, cold sores, or herpes)  immune system problems  irregular heartbeat  kidney disease  low blood counts, like low white cell, platelet, or red cell counts  lung or breathing disease, like asthma  recently received or scheduled to receive a vaccine  an unusual or allergic reaction to rituximab, other medicines, foods, dyes, or preservatives  pregnant or trying to get pregnant  breast-feeding How should I use this medicine? This medicine is  for infusion into a vein. It is administered in a hospital or clinic by a specially trained health care professional. A special MedGuide will be given to you by the pharmacist with each prescription and refill. Be sure to read this information carefully each time. Talk to your pediatrician regarding the use of this medicine in children. This medicine is not approved for use in children. Overdosage: If you think you have taken too much of this medicine contact a poison control center or emergency room at once. NOTE: This medicine is only for you. Do not share this  medicine with others. What if I miss a dose? It is important not to miss a dose. Call your doctor or health care professional if you are unable to keep an appointment. What may interact with this medicine?  cisplatin  live virus vaccines This list may not describe all possible interactions. Give your health care provider a list of all the medicines, herbs, non-prescription drugs, or dietary supplements you use. Also tell them if you smoke, drink alcohol, or use illegal drugs. Some items may interact with your medicine. What should I watch for while using this medicine? Your condition will be monitored carefully while you are receiving this medicine. You may need blood work done while you are taking this medicine. This medicine can cause serious allergic reactions. To reduce your risk you may need to take medicine before treatment with this medicine. Take your medicine as directed. In some patients, this medicine may cause a serious brain infection that may cause death. If you have any problems seeing, thinking, speaking, walking, or standing, tell your healthcare professional right away. If you cannot reach your healthcare professional, urgently seek other source of medical care. Call your doctor or health care professional for advice if you get a fever, chills or sore throat, or other symptoms of a cold or flu. Do not treat yourself. This drug decreases your body's ability to fight infections. Try to avoid being around people who are sick. Do not become pregnant while taking this medicine or for at least 12 months after stopping it. Women should inform their doctor if they wish to become pregnant or think they might be pregnant. There is a potential for serious side effects to an unborn child. Talk to your health care professional or pharmacist for more information. Do not breast-feed an infant while taking this medicine or for at least 6 months after stopping it. What side effects may I notice from  receiving this medicine? Side effects that you should report to your doctor or health care professional as soon as possible:  allergic reactions like skin rash, itching or hives; swelling of the face, lips, or tongue  breathing problems  chest pain  changes in vision  diarrhea  headache with fever, neck stiffness, sensitivity to light, nausea, or confusion  fast, irregular heartbeat  loss of memory  low blood counts - this medicine may decrease the number of white blood cells, red blood cells and platelets. You may be at increased risk for infections and bleeding.  mouth sores  problems with balance, talking, or walking  redness, blistering, peeling or loosening of the skin, including inside the mouth  signs of infection - fever or chills, cough, sore throat, pain or difficulty passing urine  signs and symptoms of kidney injury like trouble passing urine or change in the amount of urine  signs and symptoms of liver injury like dark yellow or brown urine; general ill feeling or flu-like  symptoms; light-colored stools; loss of appetite; nausea; right upper belly pain; unusually weak or tired; yellowing of the eyes or skin  signs and symptoms of low blood pressure like dizziness; feeling faint or lightheaded, falls; unusually weak or tired  stomach pain  swelling of the ankles, feet, hands  unusual bleeding or bruising  vomiting Side effects that usually do not require medical attention (report to your doctor or health care professional if they continue or are bothersome):  headache  joint pain  muscle cramps or muscle pain  nausea  tiredness This list may not describe all possible side effects. Call your doctor for medical advice about side effects. You may report side effects to FDA at 1-800-FDA-1088. Where should I keep my medicine? This drug is given in a hospital or clinic and will not be stored at home. NOTE: This sheet is a summary. It may not cover all  possible information. If you have questions about this medicine, talk to your doctor, pharmacist, or health care provider.  2020 Elsevier/Gold Standard (2018-11-28 22:01:36) Dexamethasone injection What is this medicine? DEXAMETHASONE (dex a METH a sone) is a corticosteroid. It is used to treat inflammation of the skin, joints, lungs, and other organs. Common conditions treated include asthma, allergies, and arthritis. It is also used for other conditions, like blood disorders and diseases of the adrenal glands. This medicine may be used for other purposes; ask your health care provider or pharmacist if you have questions. COMMON BRAND NAME(S): Decadron, DoubleDex, Simplist Dexamethasone, Solurex What should I tell my health care provider before I take this medicine? They need to know if you have any of these conditions:  Cushing's syndrome  diabetes  glaucoma  heart disease  high blood pressure  infection like herpes, measles, tuberculosis, or chickenpox  kidney disease  liver disease  mental illness  myasthenia gravis  osteoporosis  previous heart attack  seizures  stomach or intestine problems  thyroid disease  an unusual or allergic reaction to dexamethasone, corticosteroids, other medicines, lactose, foods, dyes, or preservatives  pregnant or trying to get pregnant  breast-feeding How should I use this medicine? This medicine is for injection into a muscle, joint, lesion, soft tissue, or vein. It is given by a health care professional in a hospital or clinic setting. Talk to your pediatrician regarding the use of this medicine in children. Special care may be needed. Overdosage: If you think you have taken too much of this medicine contact a poison control center or emergency room at once. NOTE: This medicine is only for you. Do not share this medicine with others. What if I miss a dose? This may not apply. If you are having a series of injections over a  prolonged period, try not to miss an appointment. Call your doctor or health care professional to reschedule if you are unable to keep an appointment. What may interact with this medicine? Do not take this medicine with any of the following medications:  live virus vaccines This medicine may also interact with the following medications:  aminoglutethimide  amphotericin B  aspirin and aspirin-like medicines  certain antibiotics like erythromycin, clarithromycin, and troleandomycin  certain antivirals for HIV or hepatitis  certain medicines for seizures like carbamazepine, phenobarbital, phenytoin  certain medicines to treat myasthenia gravis  cholestyramine  cyclosporine  digoxin  diuretics  ephedrine  female hormones, like estrogen or progestins and birth control pills  insulin or other medicines for diabetes  isoniazid  ketoconazole  medicines that relax muscles  for surgery  mifepristone  NSAIDs, medicines for pain and inflammation, like ibuprofen or naproxen  rifampin  skin tests for allergies  thalidomide  vaccines  warfarin This list may not describe all possible interactions. Give your health care provider a list of all the medicines, herbs, non-prescription drugs, or dietary supplements you use. Also tell them if you smoke, drink alcohol, or use illegal drugs. Some items may interact with your medicine. What should I watch for while using this medicine? Visit your health care professional for regular checks on your progress. Tell your health care professional if your symptoms do not start to get better or if they get worse. Your condition will be monitored carefully while you are receiving this medicine. Wear a medical ID bracelet or chain. Carry a card that describes your disease and details of your medicine and dosage times. This medicine may increase your risk of getting an infection. Call your health care professional for advice if you get a fever,  chills, or sore throat, or other symptoms of a cold or flu. Do not treat yourself. Try to avoid being around people who are sick. Call your health care professional if you are around anyone with measles, chickenpox, or if you develop sores or blisters that do not heal properly. If you are going to need surgery or other procedures, tell your doctor or health care professional that you have taken this medicine within the last 12 months. Ask your doctor or health care professional about your diet. You may need to lower the amount of salt you eat. This medicine may increase blood sugar. Ask your healthcare provider if changes in diet or medicines are needed if you have diabetes. What side effects may I notice from receiving this medicine? Side effects that you should report to your doctor or health care professional as soon as possible:  allergic reactions like skin rash, itching or hives, swelling of the face, lips, or tongue  bloody or black, tarry stools  changes in emotions or moods  changes in vision  confusion, excitement, restlessness  depressed mood  eye pain  hallucinations  muscle weakness  severe or sudden stomach or belly pain  signs and symptoms of high blood sugar such as being more thirsty or hungry or having to urinate more than normal. You may also feel very tired or have blurry vision.  signs and symptoms of infection like fever; chills; cough; sore throat; pain or trouble passing urine  swelling of ankles, feet  unusual bruising or bleeding  wounds that do not heal Side effects that usually do not require medical attention (report to your doctor or health care professional if they continue or are bothersome):  increased appetite  increased growth of face or body hair  headache  nausea, vomiting  pain, redness, or irritation at site where injected  skin problems, acne, thin and shiny skin  trouble sleeping  weight gain This list may not describe all  possible side effects. Call your doctor for medical advice about side effects. You may report side effects to FDA at 1-800-FDA-1088. Where should I keep my medicine? This medicine is given in a hospital or clinic and will not be stored at home. NOTE: This sheet is a summary. It may not cover all possible information. If you have questions about this medicine, talk to your doctor, pharmacist, or health care provider.  2020 Elsevier/Gold Standard (2019-04-30 13:51:58) Palonosetron Injection What is this medicine? PALONOSETRON (pal oh NOE se tron) is used to  prevent nausea and vomiting caused by chemotherapy. It also helps prevent delayed nausea and vomiting that may occur a few days after your treatment. This medicine may be used for other purposes; ask your health care provider or pharmacist if you have questions. COMMON BRAND NAME(S): Aloxi What should I tell my health care provider before I take this medicine? They need to know if you have any of these conditions:  an unusual or allergic reaction to palonosetron, dolasetron, granisetron, ondansetron, other medicines, foods, dyes, or preservatives  pregnant or trying to get pregnant  breast-feeding How should I use this medicine? This medicine is for infusion into a vein. It is given by a health care professional in a hospital or clinic setting. Talk to your pediatrician regarding the use of this medicine in children. While this drug may be prescribed for children as young as 1 month for selected conditions, precautions do apply. Overdosage: If you think you have taken too much of this medicine contact a poison control center or emergency room at once. NOTE: This medicine is only for you. Do not share this medicine with others. What if I miss a dose? This does not apply. What may interact with this medicine?  certain medicines for depression, anxiety, or psychotic disturbances  fentanyl  linezolid  MAOIs like Carbex, Eldepryl,  Marplan, Nardil, and Parnate  methylene blue (injected into a vein)  tramadol This list may not describe all possible interactions. Give your health care provider a list of all the medicines, herbs, non-prescription drugs, or dietary supplements you use. Also tell them if you smoke, drink alcohol, or use illegal drugs. Some items may interact with your medicine. What should I watch for while using this medicine? Your condition will be monitored carefully while you are receiving this medicine. What side effects may I notice from receiving this medicine? Side effects that you should report to your doctor or health care professional as soon as possible:  allergic reactions like skin rash, itching or hives, swelling of the face, lips, or tongue  breathing problems  confusion  dizziness  fast, irregular heartbeat  fever and chills  loss of balance or coordination  seizures  sweating  swelling of the hands and feet  tremors  unusually weak or tired Side effects that usually do not require medical attention (report to your doctor or health care professional if they continue or are bothersome):  constipation or diarrhea  headache This list may not describe all possible side effects. Call your doctor for medical advice about side effects. You may report side effects to FDA at 1-800-FDA-1088. Where should I keep my medicine? This drug is given in a hospital or clinic and will not be stored at home. NOTE: This sheet is a summary. It may not cover all possible information. If you have questions about this medicine, talk to your doctor, pharmacist, or health care provider.  2020 Elsevier/Gold Standard (2013-08-23 10:38:36) Acetaminophen tablets or caplets Qu es este medicamento? El ACETAMINOFENO es un analgsico. Se utiliza para tratar los dolores leves y para Engineer, materials fiebre. Este medicamento puede ser utilizado para otros usos; si tiene alguna pregunta consulte con su proveedor  de atencin mdica o con su farmacutico. MARCAS COMUNES: Aceta, Actamin, Anacin Aspirin Free, Genapap, Genebs, Mapap, Pain & Fever, Pain and Fever, PAIN RELIEF, PAIN RELIEF Extra Strength, Pain Reliever, Panadol, PHARBETOL, Q-Pap, Q-Pap Extra Strength, Tylenol, Tylenol CrushableTablet, Tylenol Extra Strength, XS No Aspirin, XS Pain Reliever Qu le debo informar a mi profesional  de la salud antes de tomar este medicamento? Necesita saber si usted presenta alguno de los siguientes problemas o situaciones:  si consume alcohol con frecuencia  enfermedad heptica  una reaccin alrgica o inusual al acetaminofeno, a otros medicamentos, alimentos, colorantes o conservantes  si est embarazada o buscando quedar embarazada  si est amamantando a un beb Cmo debo utilizar este medicamento? Tome este medicamento por va oral con un vaso de agua. Siga las instrucciones de la etiqueta o del envase del medicamento. Tome sus dosis a intervalos regulares. No tome su medicamento con una frecuencia mayor a la indicada. Hable con su pediatra para informarse acerca del uso de este medicamento en nios. Aunque este medicamento ha sido recetado a nios tan menores como de 6 aos de edad para condiciones selectivas, las precauciones se aplican. Sobredosis: Pngase en contacto inmediatamente con un centro toxicolgico o una sala de urgencia si usted cree que haya tomado demasiado medicamento. ATENCIN: [employer] es solo para usted. No comparta este medicamento con nadie. Qu sucede si me olvido de una dosis? Si olvida una dosis, tmela lo antes posible. Si es casi la hora de la prxima dosis, tome slo esa dosis. No tome dosis adicionales o dobles. Qu puede interactuar con este medicamento?  alcohol  imatinib  isoniazida  otros medicamentos con acetaminofeno Puede ser que esta lista no menciona todas las posibles interacciones. Informe a su profesional de [employer]	[place] de [employer] productos a base de  hierbas, medicamentos de [place] o suplementos nutritivos que est tomando. Si usted fuma, consume bebidas alcohlicas o si utiliza drogas ilegales, indqueselo tambin a su profesional de [employer]	[place]. Algunas sustancias pueden interactuar con su medicamento. A qu debo estar atento al usar [employer]? Informe a su mdico o profesional de la salud si el dolor dura ms de 10 das (5 das en nios), si Wilber, o si tiene un dolor nuevo o diferente. Tambin consulte con su mdico si la fiebre dura ms de 3 das. No tome con este medicamento otros medicamentos que contengan acetaminofeno. Siempre lea atentamente las etiquetas. Si tiene [place] duda, pregntele a su mdico o farmacutico. Si toma demasiado acetaminofeno, busque ayuda mdica de inmediato. Demasiado acetaminofeno puede ser muy peligroso y causar dao heptico. Incluso si no tiene sntomas, es importante obtener ayuda de inmediato. Qu efectos secundarios puedo tener al [employer] este medicamento? Efectos secundarios que debe informar a su mdico o a Barrister's clerk de la salud tan pronto como sea posible:  Chief of Staff como erupcin cutnea, picazn o urticarias, hinchazn de la cara, labios o lengua  problemas respiratorios  fiebre o dolor de garganta  enrojecimiento, formacin de ampollas, descamacin o distensin de la piel, inclusive dentro de la boca  dificultad para orinar o cambios en el volumen de orina  sangrado o magulladuras inusuales  cansancio o debilidad inusual  color amarillento de ojos o piel Efectos secundarios que, por lo general, no requieren atencin mdica (debe informarlos a su mdico o a su profesional de la salud si persisten o si son molestos):  dolor de cabeza  nuseas, Higher education careers adviser Puede ser que esta lista no menciona todos los posibles efectos secundarios. Comunquese a su mdico por asesoramiento mdico [employer] Usted puede informar los efectos secundarios  a la FDA por telfono al 1-800-FDA-1088. Dnde debo guardar mi medicina? Mantngala fuera del alcance de los nios. Gurdela a [employer], entre 20 y 40 grados C (81 y 7 grados F). Protjala de la humedad  y del calor. Deseche todo el medicamento que no haya utilizado, despus de la fecha de vencimiento. ATENCIN: Este folleto es un resumen. Puede ser que no cubra toda la posible informacin. Si usted tiene preguntas acerca de esta medicina, consulte con su mdico, su farmacutico o su profesional de Technical sales engineer.  2020 Elsevier/Gold Standard (2016-11-17 00:00:00) Diphenhydramine capsules or tablets What is this medicine? DIPHENHYDRAMINE (dye fen HYE dra meen) is an antihistamine. It is used to treat the symptoms of an allergic reaction. It is also used to treat Parkinson's disease. This medicine is also used to prevent and to treat motion sickness and as a nighttime sleep aid. This medicine may be used for other purposes; ask your health care provider or pharmacist if you have questions. COMMON BRAND NAME(S): Alka-Seltzer Plus Allergy, Aller-G-Time, Banophen, Benadryl Allergy, Benadryl Allergy Dye Free, Benadryl Allergy Kapgel, Benadryl Allergy Ultratab, Diphedryl, Diphenhist, Genahist, Geri-Dryl, PHARBEDRYL, Q-Dryl, Gretta Began, Valu-Dryl, Vicks ZzzQuil Nightime Sleep-Aid What should I tell my health care provider before I take this medicine? They need to know if you have any of these conditions:  asthma or lung disease  glaucoma  high blood pressure or heart disease  liver disease  pain or difficulty passing urine  prostate trouble  ulcers or other stomach problems  an unusual or allergic reaction to diphenhydramine, other medicines foods, dyes, or preservatives such as sulfites  pregnant or trying to get pregnant  breast-feeding How should I use this medicine? Take this medicine by mouth with a full glass of water. Follow the directions on the prescription label. Take your  doses at regular intervals. Do not take your medicine more often than directed. To prevent motion sickness start taking this medicine 30 to 60 minutes before you leave. Talk to your pediatrician regarding the use of this medicine in children. Special care may be needed. Patients over 48 years old may have a stronger reaction and need a smaller dose. Overdosage: If you think you have taken too much of this medicine contact a poison control center or emergency room at once. NOTE: This medicine is only for you. Do not share this medicine with others. What if I miss a dose? If you miss a dose, take it as soon as you can. If it is almost time for your next dose, take only that dose. Do not take double or extra doses. What may interact with this medicine? Do not take this medicine with any of the following medications:  MAOIs like Carbex, Eldepryl, Marplan, Nardil, and Parnate This medicine may also interact with the following medications:  alcohol  barbiturates, like phenobarbital  medicines for bladder spasm like oxybutynin, tolterodine  medicines for blood pressure  medicines for depression, anxiety, or psychotic disturbances  medicines for movement abnormalities or Parkinson's disease  medicines for sleep  other medicines for cold, cough or allergy  some medicines for the stomach like chlordiazepoxide, dicyclomine This list may not describe all possible interactions. Give your health care provider a list of all the medicines, herbs, non-prescription drugs, or dietary supplements you use. Also tell them if you smoke, drink alcohol, or use illegal drugs. Some items may interact with your medicine. What should I watch for while using this medicine? Visit your doctor or health care professional for regular check ups. Tell your doctor if your symptoms do not improve or if they get worse. Your mouth may get dry. Chewing sugarless gum or sucking hard candy, and drinking plenty of water may  help. Contact your doctor  if the problem does not go away or is severe. This medicine may cause dry eyes and blurred vision. If you wear contact lenses you may feel some discomfort. Lubricating drops may help. See your eye doctor if the problem does not go away or is severe. You may get drowsy or dizzy. Do not drive, use machinery, or do anything that needs mental alertness until you know how this medicine affects you. Do not stand or sit up quickly, especially if you are an older patient. This reduces the risk of dizzy or fainting spells. Alcohol may interfere with the effect of this medicine. Avoid alcoholic drinks. What side effects may I notice from receiving this medicine? Side effects that you should report to your doctor or health care professional as soon as possible:  allergic reactions like skin rash, itching or hives, swelling of the face, lips, or tongue  changes in vision  confused, agitated, nervous  irregular or fast heartbeat  tremor  trouble passing urine  unusual bleeding or bruising  unusually weak or tired Side effects that usually do not require medical attention (report to your doctor or health care professional if they continue or are bothersome):  constipation, diarrhea  drowsy  headache  loss of appetite  stomach upset, vomiting  thick mucous This list may not describe all possible side effects. Call your doctor for medical advice about side effects. You may report side effects to FDA at 1-800-FDA-1088. Where should I keep my medicine? Keep out of the reach of children. This medicine can be abused. Keep your medicine in a safe place. Store at room temperature between 15 and 30 degrees C (59 and 86 degrees F). Keep container closed tightly. Throw away any unused medicine after the expiration date. NOTE: This sheet is a summary. It may not cover all possible information. If you have questions about this medicine, talk to your doctor, pharmacist, or health  care provider.  2020 Elsevier/Gold Standard (2019-07-26 10:18:35)

## 2020-02-14 ENCOUNTER — Inpatient Hospital Stay: Payer: Medicare Other

## 2020-02-14 ENCOUNTER — Encounter: Payer: Self-pay | Admitting: *Deleted

## 2020-02-14 VITALS — BP 148/53 | HR 73 | Temp 97.5°F | Resp 17

## 2020-02-14 DIAGNOSIS — C8308 Small cell B-cell lymphoma, lymph nodes of multiple sites: Secondary | ICD-10-CM

## 2020-02-14 DIAGNOSIS — Z5112 Encounter for antineoplastic immunotherapy: Secondary | ICD-10-CM | POA: Diagnosis not present

## 2020-02-14 MED ORDER — SODIUM CHLORIDE 0.9 % IV SOLN
90.0000 mg/m2 | Freq: Once | INTRAVENOUS | Status: AC
  Administered 2020-02-14: 175 mg via INTRAVENOUS
  Filled 2020-02-14: qty 7

## 2020-02-14 MED ORDER — SODIUM CHLORIDE 0.9 % IV SOLN
Freq: Once | INTRAVENOUS | Status: AC
  Filled 2020-02-14: qty 250

## 2020-02-14 MED ORDER — SODIUM CHLORIDE 0.9 % IV SOLN
10.0000 mg | Freq: Once | INTRAVENOUS | Status: AC
  Administered 2020-02-14: 10 mg via INTRAVENOUS
  Filled 2020-02-14: qty 10

## 2020-02-14 NOTE — Patient Instructions (Signed)
[facility] Discharge Instructions for Patients Receiving Chemotherapy  Today you received the following chemotherapy agents Bendeka  To help prevent nausea and vomiting after your treatment, we encourage you to take your nausea medication as prescribed by MD.   If you develop nausea and vomiting that is not controlled by your nausea medication, call the clinic.   BELOW ARE SYMPTOMS THAT SHOULD BE REPORTED IMMEDIATELY:  *FEVER GREATER THAN 100.5 F  *CHILLS WITH OR WITHOUT FEVER  NAUSEA AND VOMITING THAT IS NOT CONTROLLED WITH YOUR NAUSEA MEDICATION  *UNUSUAL SHORTNESS OF BREATH  *UNUSUAL BRUISING OR BLEEDING  TENDERNESS IN MOUTH AND THROAT WITH OR WITHOUT PRESENCE OF ULCERS  *URINARY PROBLEMS  *BOWEL PROBLEMS  UNUSUAL RASH Items with * indicate a potential emergency and should be followed up as soon as possible.  Feel free to call the clinic should you have any questions or concerns. The clinic phone number is [phone].  Please show the [place] at check-in to the Emergency Department and triage nurse.

## 2020-02-14 NOTE — Progress Notes (Signed)
Called patient to see how she is doing after her first day of chemo yesterday. She is doing well. She had some mild nausea which she treated with zofran and it went away. She also expresses having a mild headache which resolved on its own.  She has all her prn medications (antiemetics) at home and knows how to use them. Reminded her to take at onset of nausea and not wait until symptom worsens.   She will come in today for dose two. She knows to call the office if she has any difficulty. She also knows that an on-call service will be available to her.

## 2020-03-05 NOTE — Progress Notes (Signed)
Pharmacist Chemotherapy Monitoring - Follow Up Assessment    I verify that I have reviewed each item in the below checklist:  . Regimen for the patient is scheduled for the appropriate day and plan matches scheduled date. Marland Kitchen Appropriate non-routine labs are ordered dependent on drug ordered. . If applicable, additional medications reviewed and ordered per protocol based on lifetime cumulative doses and/or treatment regimen.   Plan for follow-up and/or issues identified: No . I-vent associated with next due treatment: No . MD and/or nursing notified: No  Georgina Krist, [name] 03/05/2020 8:12 AM

## 2020-03-10 ENCOUNTER — Encounter: Payer: Self-pay | Admitting: *Deleted

## 2020-03-12 ENCOUNTER — Inpatient Hospital Stay: Payer: Medicare Other | Attending: Hematology & Oncology

## 2020-03-12 ENCOUNTER — Other Ambulatory Visit: Payer: Self-pay

## 2020-03-12 ENCOUNTER — Inpatient Hospital Stay: Payer: Medicare Other

## 2020-03-12 ENCOUNTER — Inpatient Hospital Stay (HOSPITAL_BASED_OUTPATIENT_CLINIC_OR_DEPARTMENT_OTHER): Payer: Medicare Other | Admitting: Hematology & Oncology

## 2020-03-12 ENCOUNTER — Encounter: Payer: Self-pay | Admitting: Hematology & Oncology

## 2020-03-12 ENCOUNTER — Telehealth: Payer: Self-pay | Admitting: Hematology & Oncology

## 2020-03-12 VITALS — BP 117/46 | HR 68 | Resp 17

## 2020-03-12 VITALS — BP 153/44 | HR 71 | Temp 97.3°F | Resp 16 | Wt 175.0 lb

## 2020-03-12 DIAGNOSIS — Z5112 Encounter for antineoplastic immunotherapy: Secondary | ICD-10-CM | POA: Diagnosis not present

## 2020-03-12 DIAGNOSIS — R590 Localized enlarged lymph nodes: Secondary | ICD-10-CM

## 2020-03-12 DIAGNOSIS — Z5111 Encounter for antineoplastic chemotherapy: Secondary | ICD-10-CM | POA: Insufficient documentation

## 2020-03-12 DIAGNOSIS — C8308 Small cell B-cell lymphoma, lymph nodes of multiple sites: Secondary | ICD-10-CM

## 2020-03-12 DIAGNOSIS — Z79899 Other long term (current) drug therapy: Secondary | ICD-10-CM | POA: Insufficient documentation

## 2020-03-12 LAB — CMP (CANCER CENTER ONLY)
ALT: 32 U/L (ref 0–44)
AST: 25 U/L (ref 15–41)
Albumin: 4.2 g/dL (ref 3.5–5.0)
Alkaline Phosphatase: 60 U/L (ref 38–126)
Anion gap: 10 (ref 5–15)
BUN: 9 mg/dL (ref 8–23)
CO2: 31 mmol/L (ref 22–32)
Calcium: 9.6 mg/dL (ref 8.9–10.3)
Chloride: 100 mmol/L (ref 98–111)
Creatinine: 0.64 mg/dL (ref 0.44–1.00)
GFR, Est AFR Am: >60 mL/min (ref >60)
GFR, Estimated: >60 mL/min (ref >60)
Glucose, Bld: 208 mg/dL — ABNORMAL HIGH (ref 70–99)
Potassium: 3.4 mmol/L — ABNORMAL LOW (ref 3.5–5.1)
Sodium: 141 mmol/L (ref 135–145)
Total Bilirubin: 0.7 mg/dL (ref 0.3–1.2)
Total Protein: 6.5 g/dL (ref 6.5–8.1)

## 2020-03-12 LAB — CBC WITH DIFFERENTIAL (CANCER CENTER ONLY)
Abs Immature Granulocytes: 0.02 10*3/uL (ref 0.00–0.07)
Basophils Absolute: 0 10*3/uL (ref 0.0–0.1)
Basophils Relative: 1 %
Eosinophils Absolute: 0.6 10*3/uL — ABNORMAL HIGH (ref 0.0–0.5)
Eosinophils Relative: 11 %
HCT: 41 % (ref 36.0–46.0)
Hemoglobin: 13.3 g/dL (ref 12.0–15.0)
Immature Granulocytes: 0 %
Lymphocytes Relative: 21 %
Lymphs Abs: 1.2 10*3/uL (ref 0.7–4.0)
MCH: 24.4 pg — ABNORMAL LOW (ref 26.0–34.0)
MCHC: 32.4 g/dL (ref 30.0–36.0)
MCV: 75.4 fL — ABNORMAL LOW (ref 80.0–100.0)
Monocytes Absolute: 0.8 10*3/uL (ref 0.1–1.0)
Monocytes Relative: 13 %
Neutro Abs: 3.1 10*3/uL (ref 1.7–7.7)
Neutrophils Relative %: 54 %
Platelet Count: 138 10*3/uL — ABNORMAL LOW (ref 150–400)
RBC: 5.44 MIL/uL — ABNORMAL HIGH (ref 3.87–5.11)
RDW: 14.3 % (ref 11.5–15.5)
WBC Count: 5.6 10*3/uL (ref 4.0–10.5)
nRBC: 0 % (ref 0.0–0.2)

## 2020-03-12 LAB — LACTATE DEHYDROGENASE: LDH: 182 U/L (ref 98–192)

## 2020-03-12 LAB — URIC ACID: Uric Acid, Serum: 4.8 mg/dL (ref 2.5–7.1)

## 2020-03-12 MED ORDER — SODIUM CHLORIDE 0.9 % IV SOLN
375.0000 mg/m2 | Freq: Once | INTRAVENOUS | Status: AC
  Administered 2020-03-12: 700 mg via INTRAVENOUS
  Filled 2020-03-12: qty 50

## 2020-03-12 MED ORDER — PALONOSETRON HCL INJECTION 0.25 MG/5ML
INTRAVENOUS | Status: AC
  Filled 2020-03-12: qty 5

## 2020-03-12 MED ORDER — SODIUM CHLORIDE 0.9 % IV SOLN
10.0000 mg | Freq: Once | INTRAVENOUS | Status: AC
  Administered 2020-03-12: 10 mg via INTRAVENOUS
  Filled 2020-03-12: qty 10

## 2020-03-12 MED ORDER — SODIUM CHLORIDE 0.9 % IV SOLN
90.0000 mg/m2 | Freq: Once | INTRAVENOUS | Status: AC
  Administered 2020-03-12: 175 mg via INTRAVENOUS
  Filled 2020-03-12: qty 7

## 2020-03-12 MED ORDER — PALONOSETRON HCL INJECTION 0.25 MG/5ML
0.2500 mg | Freq: Once | INTRAVENOUS | Status: AC
  Administered 2020-03-12: 0.25 mg via INTRAVENOUS

## 2020-03-12 MED ORDER — ACETAMINOPHEN 325 MG PO TABS
ORAL_TABLET | ORAL | Status: AC
  Filled 2020-03-12: qty 2

## 2020-03-12 MED ORDER — DIPHENHYDRAMINE HCL 25 MG PO CAPS
50.0000 mg | ORAL_CAPSULE | Freq: Once | ORAL | Status: AC
  Administered 2020-03-12: 50 mg via ORAL

## 2020-03-12 MED ORDER — ACETAMINOPHEN 325 MG PO TABS
650.0000 mg | ORAL_TABLET | Freq: Once | ORAL | Status: AC
  Administered 2020-03-12: 650 mg via ORAL

## 2020-03-12 MED ORDER — DIPHENHYDRAMINE HCL 25 MG PO CAPS
ORAL_CAPSULE | ORAL | Status: AC
  Filled 2020-03-12: qty 2

## 2020-03-12 MED ORDER — SODIUM CHLORIDE 0.9 % IV SOLN
Freq: Once | INTRAVENOUS | Status: AC
  Filled 2020-03-12: qty 250

## 2020-03-12 NOTE — Telephone Encounter (Signed)
Appointments scheduled calendar printed per 5/13 los

## 2020-03-12 NOTE — Progress Notes (Signed)
Hematology and Oncology Follow Up Visit  Valerie Cline [id number] July 21, 1956 64 y.o. 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  She looks great.  She really had no problems with the first cycle of treatment.  She had no nausea or vomiting.  She had no cough.  She had no chest pain.  There is no problems with constipation or diarrhea.  There was no issues with rashes.  She had no leg swelling.  She had no fever.  There is no bleeding.  I am just happy that she did well.  I am sure that she is responding.  Currently, her performance status is ECOG 0.    Medications:  Current Outpatient Medications:  .  allopurinol (ZYLOPRIM) 100 MG tablet, TAKE 1 TABLET(100 MG) BY MOUTH DAILY, Disp: 30 tablet, Rfl: 0 .  amLODipine (NORVASC) 2.5 MG tablet, Take 2.5 mg by mouth at bedtime., Disp: , Rfl:  .  aspirin EC 81 MG tablet, Take 81 mg by mouth at bedtime., Disp: , Rfl:  .  benazepril (LOTENSIN) 20 MG tablet, Take 20 mg by mouth at bedtime. , Disp: , Rfl:  .  Dulaglutide (TRULICITY) 1.5 SW/9.6PR SOPN, Inject 1.5 mg into the skin every Tuesday. , Disp: , Rfl:  .  famciclovir (FAMVIR) 500 MG tablet, Take 1 tablet (500 mg total) by mouth daily., Disp: 30 tablet, Rfl: 8 .  hydrochlorothiazide 25 MG tablet, Take 25 mg by mouth at bedtime. , Disp: , Rfl:  .  insulin regular human CONCENTRATED (HUMULIN R) 500 UNIT/ML SOLN injection, Inject 50-70 Units into the skin See admin instructions. Inject 70 units subcutaneously before breakfast, and 50 units before lunch and supper - drawn on a U-500 syringe, Disp: , Rfl:  .  LORazepam (ATIVAN) 0.5 MG tablet, Take 1 tablet (0.5 mg total) by mouth every 6 (six) hours as needed (Nausea or vomiting)., Disp: 30 tablet, Rfl: 0 .  ondansetron (ZOFRAN) 8 MG tablet, Take 1 tablet (8 mg  total) by mouth 2 (two) times daily as needed for refractory nausea / vomiting. Start on day 2 after bendamustine chemo., Disp: 30 tablet, Rfl: 1 .  prochlorperazine (COMPAZINE) 10 MG tablet, Take 1 tablet (10 mg total) by mouth every 6 (six) hours as needed (Nausea or vomiting)., Disp: 30 tablet, Rfl: 1 .  rosuvastatin (CRESTOR) 10 MG tablet, Take 10 mg by mouth at bedtime., Disp: , Rfl:   Current Facility-Administered Medications:  .  0.9 %  sodium chloride infusion, 500 mL, Intravenous, Continuous, Irene Shipper, MD  Allergies: No Known Allergies  Past Medical History, Surgical history, Social history, and Family History were reviewed and updated.  Review of Systems: Review of Systems  Constitutional: Negative.   HENT:  Negative.   Eyes: Negative.   Respiratory: Negative.   Cardiovascular: Negative.   Gastrointestinal: Negative.   Endocrine: Negative.   Genitourinary: Negative.    Musculoskeletal: Negative.   Skin: Negative.   Neurological: Negative.   Hematological: Negative.   Psychiatric/Behavioral: Negative.     Physical Exam:  weight is 175 lb (79.4 kg). Her temporal temperature is 97.3 F (36.3 C) (abnormal). Her blood pressure is 153/44 (abnormal) and her pulse is 71. Her respiration is 16 and oxygen saturation is 99%.   Wt Readings from Last 3 Encounters:  03/12/20 175 lb (79.4 kg)  02/11/20 179 lb (81.2 kg)  02/03/20 182 lb 12 oz (82.9 kg)    Physical Exam Vitals reviewed.  HENT:     Head: Normocephalic and atraumatic.  Eyes:     Pupils: Pupils are equal, round, and reactive to light.  Cardiovascular:     Rate and Rhythm: Normal rate and regular rhythm.     Heart sounds: Normal heart sounds.  Pulmonary:     Effort: Pulmonary effort is normal.     Breath sounds: Normal breath sounds.  Abdominal:     General: Bowel sounds are normal.     Palpations: Abdomen is soft.  Musculoskeletal:        General: No tenderness or deformity. Normal range of motion.      Cervical back: Normal range of motion.  Lymphadenopathy:     Cervical: No cervical adenopathy.  Skin:    General: Skin is warm and dry.     Findings: No erythema or rash.  Neurological:     Mental Status: She is alert and oriented to person, place, and time.  Psychiatric:        Behavior: Behavior normal.        Thought Content: Thought content normal.        Judgment: Judgment normal.      Lab Results  Component Value Date   WBC 5.6 03/12/2020   HGB 13.3 03/12/2020   HCT 41.0 03/12/2020   MCV 75.4 (L) 03/12/2020   PLT 138 (L) 03/12/2020     Chemistry      Component Value Date/Time   NA 141 02/13/2020 0743   K 3.3 (L) 02/13/2020 0743   CL 100 02/13/2020 0743   CO2 31 02/13/2020 0743   BUN 15 02/13/2020 0743   CREATININE 0.68 02/13/2020 0743      Component Value Date/Time   CALCIUM 9.4 02/13/2020 0743   ALKPHOS 57 02/13/2020 0743   AST 25 02/13/2020 0743   ALT 24 02/13/2020 0743   BILITOT 0.7 02/13/2020 0743      Impression and Plan: Valerie Cline is a 64 year old white female.  She has small lymphocytic B cell non-Hodgkin's lymphoma.  We will go ahead with the second cycle of treatment.  I will then plan for follow-up CT scan to see how she responds.  I really do not think a PET scan will really help [place] out in this situation.  We will plan to get her back in 1 month.  We will do the CT scan a few days before I see her.  We will get started with treatment on April 15.    Volanda Napoleon, MD 5/13/20218:16 AM

## 2020-03-12 NOTE — Patient Instructions (Addendum)
Bendamustine Injection What is this medicine? BENDAMUSTINE (BEN da MUS teen) is a chemotherapy drug. It is used to treat chronic lymphocytic leukemia and non-Hodgkin lymphoma. This medicine may be used for other purposes; ask your health care provider or pharmacist if you have questions. COMMON BRAND NAME(S): Kristine Royal, Treanda What should I tell my health care provider before I take this medicine? They need to know if you have any of these conditions:  infection (especially a virus infection such as chickenpox, cold sores, or herpes)  kidney disease  liver disease  an unusual or allergic reaction to bendamustine, mannitol, other medicines, foods, dyes, or preservatives  pregnant or trying to get pregnant  breast-feeding How should I use this medicine? This medicine is for infusion into a vein. It is given by a health care professional in a hospital or clinic setting. Talk to your pediatrician regarding the use of this medicine in children. Special care may be needed. Overdosage: If you think you have taken too much of this medicine contact a poison control center or emergency room at once. NOTE: This medicine is only for you. Do not share this medicine with others. What if I miss a dose? It is important not to miss your dose. Call your doctor or health care professional if you are unable to keep an appointment. What may interact with this medicine? Do not take this medicine with any of the following medications:  clozapine This medicine may also interact with the following medications:  atazanavir  cimetidine  ciprofloxacin  enoxacin  fluvoxamine  medicines for seizures like carbamazepine and phenobarbital  mexiletine  rifampin  tacrine  thiabendazole  zileuton This list may not describe all possible interactions. Give your health care provider a list of all the medicines, herbs, non-prescription drugs, or dietary supplements you use. Also tell them if  you smoke, drink alcohol, or use illegal drugs. Some items may interact with your medicine. What should I watch for while using this medicine? This drug may make you feel generally unwell. This is not uncommon, as chemotherapy can affect healthy cells as well as cancer cells. Report any side effects. Continue your course of treatment even though you feel ill unless your doctor tells you to stop. You may need blood work done while you are taking this medicine. Call your doctor or healthcare provider for advice if you get a fever, chills or sore throat, or other symptoms of a cold or flu. Do not treat yourself. This drug decreases your body's ability to fight infections. Try to avoid being around people who are sick. This medicine may cause serious skin reactions. They can happen weeks to months after starting the medicine. Contact your healthcare provider right away if you notice fevers or flu-like symptoms with a rash. The rash may be red or purple and then turn into blisters or peeling of the skin. Or, you might notice a red rash with swelling of the face, lips or lymph nodes in your neck or under your arms. This medicine may increase your risk to bruise or bleed. Call your doctor or healthcare provider if you notice any unusual bleeding. Talk to your doctor about your risk of cancer. You may be more at risk for certain types of cancers if you take this medicine. Do not become pregnant while taking this medicine or for at least 6 months after stopping it. Women should inform their doctor if they wish to become pregnant or think they might be pregnant. Men should not  father a child while taking this medicine and for at least 3 months after stopping it. There is a potential for serious side effects to an unborn child. Talk to your healthcare provider or pharmacist for more information. Do not breast-feed an infant while taking this medicine or for at least 1 week after stopping it. This medicine may make it  more difficult to father a child. You should talk with your doctor or healthcare provider if you are concerned about your fertility. What side effects may I notice from receiving this medicine? Side effects that you should report to your doctor or health care professional as soon as possible:  allergic reactions like skin rash, itching or hives, swelling of the face, lips, or tongue  low blood counts - this medicine may decrease the number of white blood cells, red blood cells and platelets. You may be at increased risk for infections and bleeding.  rash, fever, and swollen lymph nodes  redness, blistering, peeling, or loosening of the skin, including inside the mouth  signs of infection like fever or chills, cough, sore throat, pain or difficulty passing urine  signs of decreased platelets or bleeding like bruising, pinpoint red spots on the skin, black, tarry stools, blood in the urine  signs of decreased red blood cells like being unusually weak or tired, fainting spells, lightheadedness  signs and symptoms of kidney injury like trouble passing urine or change in the amount of urine  signs and symptoms of liver injury like dark yellow or brown urine; general ill feeling or flu-like symptoms; light-colored stools; loss of appetite; nausea; right upper belly pain; unusually weak or tired; yellowing of the eyes or skin Side effects that usually do not require medical attention (report to your doctor or health care professional if they continue or are bothersome):  constipation  decreased appetite  diarrhea  headache  mouth sores  nausea, vomiting  tiredness This list may not describe all possible side effects. Call your doctor for medical advice about side effects. You may report side effects to FDA at 1-800-FDA-1088. Where should I keep my medicine? This drug is given in a hospital or clinic and will not be stored at home. NOTE: This sheet is a summary. It may not cover all  possible information. If you have questions about this medicine, talk to your doctor, pharmacist, or health care provider.  2020 Elsevier/Gold Standard (2019-01-08 10:26:46) Rituximab injection What is this medicine? RITUXIMAB (ri TUX i mab) is a monoclonal antibody. It is used to treat certain types of cancer like non-Hodgkin lymphoma and chronic lymphocytic leukemia. It is also used to treat rheumatoid arthritis, granulomatosis with polyangiitis (or Wegener's granulomatosis), microscopic polyangiitis, and pemphigus vulgaris. This medicine may be used for other purposes; ask your health care provider or pharmacist if you have questions. COMMON BRAND NAME(S): Rituxan, RUXIENCE What should I tell my health care provider before I take this medicine? They need to know if you have any of these conditions:  heart disease  infection (especially a virus infection such as hepatitis B, chickenpox, cold sores, or herpes)  immune system problems  irregular heartbeat  kidney disease  low blood counts, like low white cell, platelet, or red cell counts  lung or breathing disease, like asthma  recently received or scheduled to receive a vaccine  an unusual or allergic reaction to rituximab, other medicines, foods, dyes, or preservatives  pregnant or trying to get pregnant  breast-feeding How should I use this medicine? This medicine is  for infusion into a vein. It is administered in a hospital or clinic by a specially trained health care professional. A special MedGuide will be given to you by the pharmacist with each prescription and refill. Be sure to read this information carefully each time. Talk to your pediatrician regarding the use of this medicine in children. This medicine is not approved for use in children. Overdosage: If you think you have taken too much of this medicine contact a poison control center or emergency room at once. NOTE: This medicine is only for you. Do not share this  medicine with others. What if I miss a dose? It is important not to miss a dose. Call your doctor or health care professional if you are unable to keep an appointment. What may interact with this medicine?  cisplatin  live virus vaccines This list may not describe all possible interactions. Give your health care provider a list of all the medicines, herbs, non-prescription drugs, or dietary supplements you use. Also tell them if you smoke, drink alcohol, or use illegal drugs. Some items may interact with your medicine. What should I watch for while using this medicine? Your condition will be monitored carefully while you are receiving this medicine. You may need blood work done while you are taking this medicine. This medicine can cause serious allergic reactions. To reduce your risk you may need to take medicine before treatment with this medicine. Take your medicine as directed. In some patients, this medicine may cause a serious brain infection that may cause death. If you have any problems seeing, thinking, speaking, walking, or standing, tell your healthcare professional right away. If you cannot reach your healthcare professional, urgently seek other source of medical care. Call your doctor or health care professional for advice if you get a fever, chills or sore throat, or other symptoms of a cold or flu. Do not treat yourself. This drug decreases your body's ability to fight infections. Try to avoid being around people who are sick. Do not become pregnant while taking this medicine or for at least 12 months after stopping it. Women should inform their doctor if they wish to become pregnant or think they might be pregnant. There is a potential for serious side effects to an unborn child. Talk to your health care professional or pharmacist for more information. Do not breast-feed an infant while taking this medicine or for at least 6 months after stopping it. What side effects may I notice from  receiving this medicine? Side effects that you should report to your doctor or health care professional as soon as possible:  allergic reactions like skin rash, itching or hives; swelling of the face, lips, or tongue  breathing problems  chest pain  changes in vision  diarrhea  headache with fever, neck stiffness, sensitivity to light, nausea, or confusion  fast, irregular heartbeat  loss of memory  low blood counts - this medicine may decrease the number of white blood cells, red blood cells and platelets. You may be at increased risk for infections and bleeding.  mouth sores  problems with balance, talking, or walking  redness, blistering, peeling or loosening of the skin, including inside the mouth  signs of infection - fever or chills, cough, sore throat, pain or difficulty passing urine  signs and symptoms of kidney injury like trouble passing urine or change in the amount of urine  signs and symptoms of liver injury like dark yellow or brown urine; general ill feeling or flu-like  symptoms; light-colored stools; loss of appetite; nausea; right upper belly pain; unusually weak or tired; yellowing of the eyes or skin  signs and symptoms of low blood pressure like dizziness; feeling faint or lightheaded, falls; unusually weak or tired  stomach pain  swelling of the ankles, feet, hands  unusual bleeding or bruising  vomiting Side effects that usually do not require medical attention (report to your doctor or health care professional if they continue or are bothersome):  headache  joint pain  muscle cramps or muscle pain  nausea  tiredness This list may not describe all possible side effects. Call your doctor for medical advice about side effects. You may report side effects to FDA at 1-800-FDA-1088. Where should I keep my medicine? This drug is given in a hospital or clinic and will not be stored at home. NOTE: This sheet is a summary. It may not cover all  possible information. If you have questions about this medicine, talk to your doctor, pharmacist, or health care provider.  2020 Elsevier/Gold Standard (2018-11-28 22:01:36) Acetaminophen oral solution What is this medicine? ACETAMINOPHEN (a set a MEE noe fen) is a pain reliever. It is used to treat mild pain and fever. This medicine may be used for other purposes; ask your health care provider or pharmacist if you have questions. COMMON BRAND NAME(S): Apra, Children's Acetaminophen, Comtrex Sore Throat Relief, ED-APAP, ElixSure Fever/Pain, Goody's Back & Body Pain, LIQUID PAIN RELIEF, M-PAP, Mapap, Pain and Fever, Q-Pap, Silapap, Triaminic Fever Reducer and Pain Reliever, Triaminic Infant Fever Reducer and Pain Reliever, Tylenol, Tylenol Extra Strength, Tylenol Sore Throat What should I tell my health care provider before I take this medicine? They need to know if you have any of these conditions:  if you often drink alcohol  liver disease  phenylketonuria  an unusual or allergic reaction to acetaminophen, other medicines, foods, dyes, or preservatives  pregnant or trying to get pregnant  breast-feeding How should I use this medicine? Take this medicine by mouth. This medicine comes in more than one concentration. Check the concentration on the label before every dose to make sure you are giving the right dose. Follow the directions on the package or prescription label. Use a specially marked spoon or dropper to measure each dose. Ask your pharmacist if you do not have one. Household spoons are not accurate. Do not take your medicine more often than directed. Talk to your pediatrician regarding the use of this medicine in children. While this drug may be prescribed for children as young as 40 years old for selected conditions, precautions do apply. Overdosage: If you think you have taken too much of this medicine contact a poison control center or emergency room at once. NOTE: This medicine  is only for you. Do not share this medicine with others. What if I miss a dose? If you miss a dose, take it as soon as you can. If it is almost time for your next dose, take only that dose. Do not take double or extra doses. What may interact with this medicine?  alcohol  imatinib  isoniazid  other medicines that contain acetaminophen This list may not describe all possible interactions. Give your health care provider a list of all the medicines, herbs, non-prescription drugs, or dietary supplements you use. Also tell them if you smoke, drink alcohol, or use illegal drugs. Some items may interact with your medicine. What should I watch for while using this medicine? Tell your doctor or health care professional if  the pain lasts more than 10 days (5 days for children), if it gets worse, or if there is a new or different kind of pain. Also, check with your doctor if a fever lasts for more than 3 days. Do not take acetaminophen (Tylenol) or other medicines that contain acetaminophen with this medicine. Too much acetaminophen can be very dangerous and cause an overdose. Always read labels carefully. Report any possible overdose to your doctor right away, even if there are no symptoms. The effects of extra doses may not be seen for many days. What side effects may I notice from receiving this medicine? Side effects that you should report to your doctor or health care professional as soon as possible:  allergic reactions like skin rash, itching or hives, swelling of the face, lips, or tongue  breathing problems  redness, blistering, peeling or loosening of the skin, including inside the mouth  sore throat with fever, headache, rash, nausea, or vomiting  trouble passing urine or change in the amount of urine  unusual bleeding or bruising  unusually weak or tired  yellowing of the eyes, skin Side effects that usually do not require medical attention (report to your doctor or health care  professional if they continue or are bothersome):  headache  nausea, stomach upset This list may not describe all possible side effects. Call your doctor for medical advice about side effects. You may report side effects to FDA at 1-800-FDA-1088. Where should I keep my medicine? Keep out of reach of children. Store at room temperature between 20 and 25 degrees C (68 and 77 degrees F). Protect from moisture and heat. Throw away any unused medicine after the expiration date. NOTE: This sheet is a summary. It may not cover all possible information. If you have questions about this medicine, talk to your doctor, pharmacist, or health care provider.  2020 Elsevier/Gold Standard (2013-06-10 13:56:24) Diphenhydramine injection What is this medicine? DIPHENHYDRAMINE (dye fen HYE dra meen) is an antihistamine. It is used to treat the symptoms of an allergic reaction and motion sickness. It is also used to treat Parkinson's disease. This medicine may be used for other purposes; ask your health care provider or pharmacist if you have questions. COMMON BRAND NAME(S): Benadryl What should I tell my health care provider before I take this medicine? They need to know if you have any of these conditions:  asthma or lung disease  glaucoma  high blood pressure or heart disease  liver disease  pain or difficulty passing urine  prostate trouble  ulcers or other stomach problems  an unusual or allergic reaction to diphenhydramine, antihistamines, other medicines foods, dyes, or preservatives  pregnant or trying to get pregnant  breast-feeding How should I use this medicine? This medicine is for injection into a vein or a muscle. It is usually given by a health care professional in a hospital or clinic setting. If you get this medicine at home, you will be taught how to prepare and give this medicine. Use exactly as directed. Take your medicine at regular intervals. Do not take your medicine more  often than directed. It is important that you put your used needles and syringes in a special sharps container. Do not put them in a trash can. If you do not have a sharps container, call your pharmacist or healthcare provider to get one. Talk to your pediatrician regarding the use of this medicine in children. While this drug may be prescribed for selected conditions, precautions do apply.  This medicine is not approved for use in newborns and premature babies. Patients over 22 years old may have a stronger reaction and need a smaller dose. Overdosage: If you think you have taken too much of this medicine contact a poison control center or emergency room at once. NOTE: This medicine is only for you. Do not share this medicine with others. What if I miss a dose? If you miss a dose, take it as soon as you can. If it is almost time for your next dose, take only that dose. Do not take double or extra doses. What may interact with this medicine? Do not take this medicine with any of the following medications:  MAOIs like Carbex, Eldepryl, Marplan, Nardil, and Parnate This medicine may also interact with the following medications:  alcohol  barbiturates, like phenobarbital  medicines for bladder spasm like oxybutynin, tolterodine  medicines for blood pressure  medicines for depression, anxiety, or psychotic disturbances  medicines for movement abnormalities or Parkinson's disease  medicines for sleep  other medicines for cold, cough or allergy  some medicines for the stomach like chlordiazepoxide, dicyclomine This list may not describe all possible interactions. Give your health care provider a list of all the medicines, herbs, non-prescription drugs, or dietary supplements you use. Also tell them if you smoke, drink alcohol, or use illegal drugs. Some items may interact with your medicine. What should I watch for while using this medicine? Your condition will be monitored carefully while  you are receiving this medicine. Tell your doctor or healthcare professional if your symptoms do not start to get better or if they get worse. You may get drowsy or dizzy. Do not drive, use machinery, or do anything that needs mental alertness until you know how this medicine affects you. Do not stand or sit up quickly, especially if you are an older patient. This reduces the risk of dizzy or fainting spells. Alcohol may interfere with the effect of this medicine. Avoid alcoholic drinks. Your mouth may get dry. Chewing sugarless gum or sucking hard candy, and drinking plenty of water may help. Contact your doctor if the problem does not go away or is severe. What side effects may I notice from receiving this medicine? Side effects that you should report to your doctor or health care professional as soon as possible:  allergic reactions like skin rash, itching or hives, swelling of the face, lips, or tongue  breathing problems  changes in vision  chills  confused, agitated, nervous  irregular or fast heartbeat  low blood pressure  seizures  tremor  trouble passing urine  unusual bleeding or bruising  unusually weak or tired Side effects that usually do not require medical attention (report to your doctor or health care professional if they continue or are bothersome):  constipation, diarrhea  drowsy  headache  loss of appetite  stomach upset, vomiting  sweating  thick mucous This list may not describe all possible side effects. Call your doctor for medical advice about side effects. You may report side effects to FDA at 1-800-FDA-1088. Where should I keep my medicine? Keep out of the reach of children. If you are using this medicine at home, you will be instructed on how to store this medicine. Throw away any unused medicine after the expiration date on the label. NOTE: This sheet is a summary. It may not cover all possible information. If you have questions about this  medicine, talk to your doctor, pharmacist, or health care provider.  2020 Elsevier/Gold Standard (2008-02-05 14:28:35) Palonosetron Injection What is this medicine? PALONOSETRON (pal oh NOE se tron) is used to prevent nausea and vomiting caused by chemotherapy. It also helps prevent delayed nausea and vomiting that may occur a few days after your treatment. This medicine may be used for other purposes; ask your health care provider or pharmacist if you have questions. COMMON BRAND NAME(S): Aloxi What should I tell my health care provider before I take this medicine? They need to know if you have any of these conditions:  an unusual or allergic reaction to palonosetron, dolasetron, granisetron, ondansetron, other medicines, foods, dyes, or preservatives  pregnant or trying to get pregnant  breast-feeding How should I use this medicine? This medicine is for infusion into a vein. It is given by a health care professional in a hospital or clinic setting. Talk to your pediatrician regarding the use of this medicine in children. While this drug may be prescribed for children as young as 1 month for selected conditions, precautions do apply. Overdosage: If you think you have taken too much of this medicine contact a poison control center or emergency room at once. NOTE: This medicine is only for you. Do not share this medicine with others. What if I miss a dose? This does not apply. What may interact with this medicine?  certain medicines for depression, anxiety, or psychotic disturbances  fentanyl  linezolid  MAOIs like Carbex, Eldepryl, Marplan, Nardil, and Parnate  methylene blue (injected into a vein)  tramadol This list may not describe all possible interactions. Give your health care provider a list of all the medicines, herbs, non-prescription drugs, or dietary supplements you use. Also tell them if you smoke, drink alcohol, or use illegal drugs. Some items may interact with your  medicine. What should I watch for while using this medicine? Your condition will be monitored carefully while you are receiving this medicine. What side effects may I notice from receiving this medicine? Side effects that you should report to your doctor or health care professional as soon as possible:  allergic reactions like skin rash, itching or hives, swelling of the face, lips, or tongue  breathing problems  confusion  dizziness  fast, irregular heartbeat  fever and chills  loss of balance or coordination  seizures  sweating  swelling of the hands and feet  tremors  unusually weak or tired Side effects that usually do not require medical attention (report to your doctor or health care professional if they continue or are bothersome):  constipation or diarrhea  headache This list may not describe all possible side effects. Call your doctor for medical advice about side effects. You may report side effects to FDA at 1-800-FDA-1088. Where should I keep my medicine? This drug is given in a hospital or clinic and will not be stored at home. NOTE: This sheet is a summary. It may not cover all possible information. If you have questions about this medicine, talk to your doctor, pharmacist, or health care provider.  2020 Elsevier/Gold Standard (2013-08-23 10:38:36)

## 2020-03-13 ENCOUNTER — Inpatient Hospital Stay: Payer: Medicare Other

## 2020-03-13 ENCOUNTER — Encounter: Payer: Self-pay | Admitting: *Deleted

## 2020-03-13 VITALS — BP 150/68 | HR 76 | Temp 97.0°F | Resp 18

## 2020-03-13 DIAGNOSIS — Z5112 Encounter for antineoplastic immunotherapy: Secondary | ICD-10-CM | POA: Diagnosis not present

## 2020-03-13 DIAGNOSIS — C8308 Small cell B-cell lymphoma, lymph nodes of multiple sites: Secondary | ICD-10-CM

## 2020-03-13 LAB — BETA 2 MICROGLOBULIN, SERUM: Beta-2 Microglobulin: 3.1 mg/L — ABNORMAL HIGH (ref 0.6–2.4)

## 2020-03-13 MED ORDER — SODIUM CHLORIDE 0.9 % IV SOLN
Freq: Once | INTRAVENOUS | Status: AC
  Filled 2020-03-13: qty 250

## 2020-03-13 MED ORDER — SODIUM CHLORIDE 0.9 % IV SOLN
90.0000 mg/m2 | Freq: Once | INTRAVENOUS | Status: AC
  Administered 2020-03-13: 175 mg via INTRAVENOUS
  Filled 2020-03-13: qty 7

## 2020-03-13 MED ORDER — SODIUM CHLORIDE 0.9 % IV SOLN
10.0000 mg | Freq: Once | INTRAVENOUS | Status: AC
  Administered 2020-03-13: 10 mg via INTRAVENOUS
  Filled 2020-03-13: qty 10

## 2020-03-13 NOTE — Patient Instructions (Signed)
Bendamustine Injection What is this medicine? BENDAMUSTINE (BEN da MUS teen) is a chemotherapy drug. It is used to treat chronic lymphocytic leukemia and non-Hodgkin lymphoma. This medicine may be used for other purposes; ask your health care provider or pharmacist if you have questions. COMMON BRAND NAME(S): BELRAPZO, BENDEKA, Treanda What should I tell my health care provider before I take this medicine? They need to know if you have any of these conditions:  infection (especially a virus infection such as chickenpox, cold sores, or herpes)  kidney disease  liver disease  an unusual or allergic reaction to bendamustine, mannitol, other medicines, foods, dyes, or preservatives  pregnant or trying to get pregnant  breast-feeding How should I use this medicine? This medicine is for infusion into a vein. It is given by a health care professional in a hospital or clinic setting. Talk to your pediatrician regarding the use of this medicine in children. Special care may be needed. Overdosage: If you think you have taken too much of this medicine contact a poison control center or emergency room at once. NOTE: This medicine is only for you. Do not share this medicine with others. What if I miss a dose? It is important not to miss your dose. Call your doctor or health care professional if you are unable to keep an appointment. What may interact with this medicine? Do not take this medicine with any of the following medications:  clozapine This medicine may also interact with the following medications:  atazanavir  cimetidine  ciprofloxacin  enoxacin  fluvoxamine  medicines for seizures like carbamazepine and phenobarbital  mexiletine  rifampin  tacrine  thiabendazole  zileuton This list may not describe all possible interactions. Give your health care provider a list of all the medicines, herbs, non-prescription drugs, or dietary supplements you use. Also tell them if  you smoke, drink alcohol, or use illegal drugs. Some items may interact with your medicine. What should I watch for while using this medicine? This drug may make you feel generally unwell. This is not uncommon, as chemotherapy can affect healthy cells as well as cancer cells. Report any side effects. Continue your course of treatment even though you feel ill unless your doctor tells you to stop. You may need blood work done while you are taking this medicine. Call your doctor or healthcare provider for advice if you get a fever, chills or sore throat, or other symptoms of a cold or flu. Do not treat yourself. This drug decreases your body's ability to fight infections. Try to avoid being around people who are sick. This medicine may cause serious skin reactions. They can happen weeks to months after starting the medicine. Contact your healthcare provider right away if you notice fevers or flu-like symptoms with a rash. The rash may be red or purple and then turn into blisters or peeling of the skin. Or, you might notice a red rash with swelling of the face, lips or lymph nodes in your neck or under your arms. This medicine may increase your risk to bruise or bleed. Call your doctor or healthcare provider if you notice any unusual bleeding. Talk to your doctor about your risk of cancer. You may be more at risk for certain types of cancers if you take this medicine. Do not become pregnant while taking this medicine or for at least 6 months after stopping it. Women should inform their doctor if they wish to become pregnant or think they might be pregnant. Men should not   father a child while taking this medicine and for at least 3 months after stopping it. There is a potential for serious side effects to an unborn child. Talk to your healthcare provider or pharmacist for more information. Do not breast-feed an infant while taking this medicine or for at least 1 week after stopping it. This medicine may make it  more difficult to father a child. You should talk with your doctor or healthcare provider if you are concerned about your fertility. What side effects may I notice from receiving this medicine? Side effects that you should report to your doctor or health care professional as soon as possible:  allergic reactions like skin rash, itching or hives, swelling of the face, lips, or tongue  low blood counts - this medicine may decrease the number of white blood cells, red blood cells and platelets. You may be at increased risk for infections and bleeding.  rash, fever, and swollen lymph nodes  redness, blistering, peeling, or loosening of the skin, including inside the mouth  signs of infection like fever or chills, cough, sore throat, pain or difficulty passing urine  signs of decreased platelets or bleeding like bruising, pinpoint red spots on the skin, black, tarry stools, blood in the urine  signs of decreased red blood cells like being unusually weak or tired, fainting spells, lightheadedness  signs and symptoms of kidney injury like trouble passing urine or change in the amount of urine  signs and symptoms of liver injury like dark yellow or brown urine; general ill feeling or flu-like symptoms; light-colored stools; loss of appetite; nausea; right upper belly pain; unusually weak or tired; yellowing of the eyes or skin Side effects that usually do not require medical attention (report to your doctor or health care professional if they continue or are bothersome):  constipation  decreased appetite  diarrhea  headache  mouth sores  nausea, vomiting  tiredness This list may not describe all possible side effects. Call your doctor for medical advice about side effects. You may report side effects to FDA at 1-800-FDA-1088. Where should I keep my medicine? This drug is given in a hospital or clinic and will not be stored at home. NOTE: This sheet is a summary. It may not cover all  possible information. If you have questions about this medicine, talk to your doctor, pharmacist, or health care provider.  2020 Elsevier/Gold Standard (2019-01-08 10:26:46)  

## 2020-03-17 ENCOUNTER — Telehealth: Payer: Self-pay | Admitting: Hematology & Oncology

## 2020-03-17 NOTE — Telephone Encounter (Signed)
Appointments changed as patient requested and updated calendar was mailed per 5/17 sch msg

## 2020-04-02 NOTE — Progress Notes (Signed)
Pharmacist Chemotherapy Monitoring - Follow Up Assessment    I verify that I have reviewed each item in the below checklist:  . Regimen for the patient is scheduled for the appropriate day and plan matches scheduled date. Marland Kitchen Appropriate non-routine labs are ordered dependent on drug ordered. . If applicable, additional medications reviewed and ordered per protocol based on lifetime cumulative doses and/or treatment regimen.   Plan for follow-up and/or issues identified: Yes . I-vent associated with next due treatment: Yes . MD and/or nursing notified: Yes  Valerie Cline, Valerie Cline 04/02/2020 8:05 AM

## 2020-04-06 ENCOUNTER — Other Ambulatory Visit: Payer: Self-pay

## 2020-04-06 ENCOUNTER — Encounter (HOSPITAL_BASED_OUTPATIENT_CLINIC_OR_DEPARTMENT_OTHER): Payer: Self-pay

## 2020-04-06 ENCOUNTER — Ambulatory Visit (HOSPITAL_BASED_OUTPATIENT_CLINIC_OR_DEPARTMENT_OTHER)
Admission: RE | Admit: 2020-04-06 | Discharge: 2020-04-06 | Disposition: A | Discharge status: Home / Self Care | Payer: Medicare Other | Source: Ambulatory Visit | Attending: Hematology & Oncology | Admitting: Hematology & Oncology

## 2020-04-06 DIAGNOSIS — C8308 Small cell B-cell lymphoma, lymph nodes of multiple sites: Secondary | ICD-10-CM | POA: Diagnosis present

## 2020-04-06 DIAGNOSIS — R590 Localized enlarged lymph nodes: Secondary | ICD-10-CM

## 2020-04-06 MED ORDER — IOHEXOL 300 MG/ML  SOLN
100.0000 mL | Freq: Once | INTRAMUSCULAR | Status: AC | PRN
  Administered 2020-04-06: 100 mL via INTRAVENOUS

## 2020-04-07 ENCOUNTER — Telehealth: Payer: Self-pay | Admitting: *Deleted

## 2020-04-07 NOTE — Telephone Encounter (Signed)
Patient notified per order of Dr. Marin Olp that "the lymphoma is practically gone!!  The nodes have shrunken very nicely!!!  [place] job!!"  Pt appreciative of call and has no questions or concerns at this time.

## 2020-04-07 NOTE — Telephone Encounter (Signed)
-----   Message from Volanda Napoleon, MD sent at 04/06/2020  5:42 PM EDT ----- Call - the lymphoma is practically gone!!  The nodes have shrunken very nicely!!!  [place] job!!  Laurey Arrow

## 2020-04-09 ENCOUNTER — Inpatient Hospital Stay: Payer: Medicare Other

## 2020-04-09 ENCOUNTER — Telehealth: Payer: Self-pay | Admitting: *Deleted

## 2020-04-09 ENCOUNTER — Inpatient Hospital Stay (HOSPITAL_BASED_OUTPATIENT_CLINIC_OR_DEPARTMENT_OTHER): Payer: Medicare Other | Admitting: Hematology & Oncology

## 2020-04-09 ENCOUNTER — Encounter: Payer: Self-pay | Admitting: Pharmacist

## 2020-04-09 ENCOUNTER — Encounter: Payer: Self-pay | Admitting: Hematology & Oncology

## 2020-04-09 ENCOUNTER — Other Ambulatory Visit: Payer: Self-pay

## 2020-04-09 ENCOUNTER — Telehealth: Payer: Self-pay | Admitting: Hematology & Oncology

## 2020-04-09 ENCOUNTER — Inpatient Hospital Stay: Payer: Medicare Other | Attending: Hematology & Oncology

## 2020-04-09 VITALS — BP 124/49 | HR 69 | Temp 97.5°F | Resp 18

## 2020-04-09 VITALS — BP 147/54 | HR 77 | Temp 98.4°F | Resp 18 | Wt 173.5 lb

## 2020-04-09 DIAGNOSIS — C8308 Small cell B-cell lymphoma, lymph nodes of multiple sites: Secondary | ICD-10-CM | POA: Insufficient documentation

## 2020-04-09 DIAGNOSIS — Z5111 Encounter for antineoplastic chemotherapy: Secondary | ICD-10-CM | POA: Diagnosis present

## 2020-04-09 DIAGNOSIS — I10 Essential (primary) hypertension: Secondary | ICD-10-CM | POA: Diagnosis not present

## 2020-04-09 DIAGNOSIS — Z79899 Other long term (current) drug therapy: Secondary | ICD-10-CM | POA: Insufficient documentation

## 2020-04-09 DIAGNOSIS — Z5112 Encounter for antineoplastic immunotherapy: Secondary | ICD-10-CM | POA: Insufficient documentation

## 2020-04-09 DIAGNOSIS — E876 Hypokalemia: Secondary | ICD-10-CM

## 2020-04-09 LAB — CBC WITH DIFFERENTIAL (CANCER CENTER ONLY)
Abs Immature Granulocytes: 0.03 10*3/uL (ref 0.00–0.07)
Basophils Absolute: 0 10*3/uL (ref 0.0–0.1)
Basophils Relative: 1 %
Eosinophils Absolute: 2.3 10*3/uL — ABNORMAL HIGH (ref 0.0–0.5)
Eosinophils Relative: 29 %
HCT: 40.1 % (ref 36.0–46.0)
Hemoglobin: 13.3 g/dL (ref 12.0–15.0)
Immature Granulocytes: 0 %
Lymphocytes Relative: 17 %
Lymphs Abs: 1.3 10*3/uL (ref 0.7–4.0)
MCH: 25.1 pg — ABNORMAL LOW (ref 26.0–34.0)
MCHC: 33.2 g/dL (ref 30.0–36.0)
MCV: 75.7 fL — ABNORMAL LOW (ref 80.0–100.0)
Monocytes Absolute: 1 10*3/uL (ref 0.1–1.0)
Monocytes Relative: 13 %
Neutro Abs: 3.2 10*3/uL (ref 1.7–7.7)
Neutrophils Relative %: 40 %
Platelet Count: 184 10*3/uL (ref 150–400)
RBC: 5.3 MIL/uL — ABNORMAL HIGH (ref 3.87–5.11)
RDW: 14.8 % (ref 11.5–15.5)
WBC Count: 7.8 10*3/uL (ref 4.0–10.5)
nRBC: 0 % (ref 0.0–0.2)

## 2020-04-09 LAB — CMP (CANCER CENTER ONLY)
ALT: 31 U/L (ref 0–44)
AST: 28 U/L (ref 15–41)
Albumin: 4.2 g/dL (ref 3.5–5.0)
Alkaline Phosphatase: 62 U/L (ref 38–126)
Anion gap: 11 (ref 5–15)
BUN: 12 mg/dL (ref 8–23)
CO2: 31 mmol/L (ref 22–32)
Calcium: 9.7 mg/dL (ref 8.9–10.3)
Chloride: 99 mmol/L (ref 98–111)
Creatinine: 0.58 mg/dL (ref 0.44–1.00)
GFR, Est AFR Am: >60 mL/min (ref >60)
GFR, Estimated: >60 mL/min (ref >60)
Glucose, Bld: 191 mg/dL — ABNORMAL HIGH (ref 70–99)
Potassium: 3 mmol/L — CL (ref 3.5–5.1)
Sodium: 141 mmol/L (ref 135–145)
Total Bilirubin: 0.8 mg/dL (ref 0.3–1.2)
Total Protein: 6.6 g/dL (ref 6.5–8.1)

## 2020-04-09 LAB — LACTATE DEHYDROGENASE: LDH: 181 U/L (ref 98–192)

## 2020-04-09 MED ORDER — PALONOSETRON HCL INJECTION 0.25 MG/5ML
0.2500 mg | Freq: Once | INTRAVENOUS | Status: AC
  Administered 2020-04-09: 0.25 mg via INTRAVENOUS

## 2020-04-09 MED ORDER — PALONOSETRON HCL INJECTION 0.25 MG/5ML
INTRAVENOUS | Status: AC
  Filled 2020-04-09: qty 5

## 2020-04-09 MED ORDER — DIPHENHYDRAMINE HCL 25 MG PO CAPS
ORAL_CAPSULE | ORAL | Status: AC
  Filled 2020-04-09: qty 2

## 2020-04-09 MED ORDER — ACETAMINOPHEN 325 MG PO TABS
650.0000 mg | ORAL_TABLET | Freq: Once | ORAL | Status: AC
  Administered 2020-04-09: 650 mg via ORAL

## 2020-04-09 MED ORDER — ACETAMINOPHEN 325 MG PO TABS
ORAL_TABLET | ORAL | Status: AC
  Filled 2020-04-09: qty 2

## 2020-04-09 MED ORDER — POTASSIUM CHLORIDE CRYS ER 20 MEQ PO TBCR
40.0000 meq | EXTENDED_RELEASE_TABLET | Freq: Two times a day (BID) | ORAL | Status: DC
  Administered 2020-04-09 (×2): 40 meq via ORAL
  Filled 2020-04-09: qty 2

## 2020-04-09 MED ORDER — SODIUM CHLORIDE 0.9 % IV SOLN
10.0000 mg | Freq: Once | INTRAVENOUS | Status: AC
  Administered 2020-04-09: 10 mg via INTRAVENOUS
  Filled 2020-04-09: qty 10

## 2020-04-09 MED ORDER — DIPHENHYDRAMINE HCL 25 MG PO CAPS
50.0000 mg | ORAL_CAPSULE | Freq: Once | ORAL | Status: AC
  Administered 2020-04-09: 50 mg via ORAL

## 2020-04-09 MED ORDER — SODIUM CHLORIDE 0.9 % IV SOLN: 375.0000 mg/m2 | Freq: Once | INTRAVENOUS | Status: DC

## 2020-04-09 MED ORDER — SODIUM CHLORIDE 0.9 % IV SOLN
Freq: Once | INTRAVENOUS | Status: AC
  Filled 2020-04-09: qty 250

## 2020-04-09 MED ORDER — SODIUM CHLORIDE 0.9 % IV SOLN
375.0000 mg/m2 | Freq: Once | INTRAVENOUS | Status: AC
  Administered 2020-04-09: 700 mg via INTRAVENOUS
  Filled 2020-04-09: qty 50

## 2020-04-09 MED ORDER — SODIUM CHLORIDE 0.9 % IV SOLN
90.0000 mg/m2 | Freq: Once | INTRAVENOUS | Status: AC
  Administered 2020-04-09: 175 mg via INTRAVENOUS
  Filled 2020-04-09: qty 7

## 2020-04-09 NOTE — Patient Instructions (Signed)
Bendamustine Injection What is this medicine? BENDAMUSTINE (BEN da MUS teen) is a chemotherapy drug. It is used to treat chronic lymphocytic leukemia and non-Hodgkin lymphoma. This medicine may be used for other purposes; ask your health care provider or pharmacist if you have questions. COMMON BRAND NAME(S): Kristine Royal, Treanda What should I tell my health care provider before I take this medicine? They need to know if you have any of these conditions:  infection (especially a virus infection such as chickenpox, cold sores, or herpes)  kidney disease  liver disease  an unusual or allergic reaction to bendamustine, mannitol, other medicines, foods, dyes, or preservatives  pregnant or trying to get pregnant  breast-feeding How should I use this medicine? This medicine is for infusion into a vein. It is given by a health care professional in a hospital or clinic setting. Talk to your pediatrician regarding the use of this medicine in children. Special care may be needed. Overdosage: If you think you have taken too much of this medicine contact a poison control center or emergency room at once. NOTE: This medicine is only for you. Do not share this medicine with others. What if I miss a dose? It is important not to miss your dose. Call your doctor or health care professional if you are unable to keep an appointment. What may interact with this medicine? Do not take this medicine with any of the following medications:  clozapine This medicine may also interact with the following medications:  atazanavir  cimetidine  ciprofloxacin  enoxacin  fluvoxamine  medicines for seizures like carbamazepine and phenobarbital  mexiletine  rifampin  tacrine  thiabendazole  zileuton This list may not describe all possible interactions. Give your health care provider a list of all the medicines, herbs, non-prescription drugs, or dietary supplements you use. Also tell them if  you smoke, drink alcohol, or use illegal drugs. Some items may interact with your medicine. What should I watch for while using this medicine? This drug may make you feel generally unwell. This is not uncommon, as chemotherapy can affect healthy cells as well as cancer cells. Report any side effects. Continue your course of treatment even though you feel ill unless your doctor tells you to stop. You may need blood work done while you are taking this medicine. Call your doctor or healthcare provider for advice if you get a fever, chills or sore throat, or other symptoms of a cold or flu. Do not treat yourself. This drug decreases your body's ability to fight infections. Try to avoid being around people who are sick. This medicine may cause serious skin reactions. They can happen weeks to months after starting the medicine. Contact your healthcare provider right away if you notice fevers or flu-like symptoms with a rash. The rash may be red or purple and then turn into blisters or peeling of the skin. Or, you might notice a red rash with swelling of the face, lips or lymph nodes in your neck or under your arms. This medicine may increase your risk to bruise or bleed. Call your doctor or healthcare provider if you notice any unusual bleeding. Talk to your doctor about your risk of cancer. You may be more at risk for certain types of cancers if you take this medicine. Do not become pregnant while taking this medicine or for at least 6 months after stopping it. Women should inform their doctor if they wish to become pregnant or think they might be pregnant. Men should not  father a child while taking this medicine and for at least 3 months after stopping it. There is a potential for serious side effects to an unborn child. Talk to your healthcare provider or pharmacist for more information. Do not breast-feed an infant while taking this medicine or for at least 1 week after stopping it. This medicine may make it  more difficult to father a child. You should talk with your doctor or healthcare provider if you are concerned about your fertility. What side effects may I notice from receiving this medicine? Side effects that you should report to your doctor or health care professional as soon as possible:  allergic reactions like skin rash, itching or hives, swelling of the face, lips, or tongue  low blood counts - this medicine may decrease the number of white blood cells, red blood cells and platelets. You may be at increased risk for infections and bleeding.  rash, fever, and swollen lymph nodes  redness, blistering, peeling, or loosening of the skin, including inside the mouth  signs of infection like fever or chills, cough, sore throat, pain or difficulty passing urine  signs of decreased platelets or bleeding like bruising, pinpoint red spots on the skin, black, tarry stools, blood in the urine  signs of decreased red blood cells like being unusually weak or tired, fainting spells, lightheadedness  signs and symptoms of kidney injury like trouble passing urine or change in the amount of urine  signs and symptoms of liver injury like dark yellow or brown urine; general ill feeling or flu-like symptoms; light-colored stools; loss of appetite; nausea; right upper belly pain; unusually weak or tired; yellowing of the eyes or skin Side effects that usually do not require medical attention (report to your doctor or health care professional if they continue or are bothersome):  constipation  decreased appetite  diarrhea  headache  mouth sores  nausea, vomiting  tiredness This list may not describe all possible side effects. Call your doctor for medical advice about side effects. You may report side effects to FDA at 1-800-FDA-1088. Where should I keep my medicine? This drug is given in a hospital or clinic and will not be stored at home. NOTE: This sheet is a summary. It may not cover all  possible information. If you have questions about this medicine, talk to your doctor, pharmacist, or health care provider.  2020 Elsevier/Gold Standard (2019-01-08 10:26:46) Rituximab injection What is this medicine? RITUXIMAB (ri TUX i mab) is a monoclonal antibody. It is used to treat certain types of cancer like non-Hodgkin lymphoma and chronic lymphocytic leukemia. It is also used to treat rheumatoid arthritis, granulomatosis with polyangiitis (or Wegener's granulomatosis), microscopic polyangiitis, and pemphigus vulgaris. This medicine may be used for other purposes; ask your health care provider or pharmacist if you have questions. COMMON BRAND NAME(S): Rituxan, RUXIENCE What should I tell my health care provider before I take this medicine? They need to know if you have any of these conditions:  heart disease  infection (especially a virus infection such as hepatitis B, chickenpox, cold sores, or herpes)  immune system problems  irregular heartbeat  kidney disease  low blood counts, like low white cell, platelet, or red cell counts  lung or breathing disease, like asthma  recently received or scheduled to receive a vaccine  an unusual or allergic reaction to rituximab, other medicines, foods, dyes, or preservatives  pregnant or trying to get pregnant  breast-feeding How should I use this medicine? This medicine is  for infusion into a vein. It is administered in a hospital or clinic by a specially trained health care professional. A special MedGuide will be given to you by the pharmacist with each prescription and refill. Be sure to read this information carefully each time. Talk to your pediatrician regarding the use of this medicine in children. This medicine is not approved for use in children. Overdosage: If you think you have taken too much of this medicine contact a poison control center or emergency room at once. NOTE: This medicine is only for you. Do not share this  medicine with others. What if I miss a dose? It is important not to miss a dose. Call your doctor or health care professional if you are unable to keep an appointment. What may interact with this medicine?  cisplatin  live virus vaccines This list may not describe all possible interactions. Give your health care provider a list of all the medicines, herbs, non-prescription drugs, or dietary supplements you use. Also tell them if you smoke, drink alcohol, or use illegal drugs. Some items may interact with your medicine. What should I watch for while using this medicine? Your condition will be monitored carefully while you are receiving this medicine. You may need blood work done while you are taking this medicine. This medicine can cause serious allergic reactions. To reduce your risk you may need to take medicine before treatment with this medicine. Take your medicine as directed. In some patients, this medicine may cause a serious brain infection that may cause death. If you have any problems seeing, thinking, speaking, walking, or standing, tell your healthcare professional right away. If you cannot reach your healthcare professional, urgently seek other source of medical care. Call your doctor or health care professional for advice if you get a fever, chills or sore throat, or other symptoms of a cold or flu. Do not treat yourself. This drug decreases your body's ability to fight infections. Try to avoid being around people who are sick. Do not become pregnant while taking this medicine or for at least 12 months after stopping it. Women should inform their doctor if they wish to become pregnant or think they might be pregnant. There is a potential for serious side effects to an unborn child. Talk to your health care professional or pharmacist for more information. Do not breast-feed an infant while taking this medicine or for at least 6 months after stopping it. What side effects may I notice from  receiving this medicine? Side effects that you should report to your doctor or health care professional as soon as possible:  allergic reactions like skin rash, itching or hives; swelling of the face, lips, or tongue  breathing problems  chest pain  changes in vision  diarrhea  headache with fever, neck stiffness, sensitivity to light, nausea, or confusion  fast, irregular heartbeat  loss of memory  low blood counts - this medicine may decrease the number of white blood cells, red blood cells and platelets. You may be at increased risk for infections and bleeding.  mouth sores  problems with balance, talking, or walking  redness, blistering, peeling or loosening of the skin, including inside the mouth  signs of infection - fever or chills, cough, sore throat, pain or difficulty passing urine  signs and symptoms of kidney injury like trouble passing urine or change in the amount of urine  signs and symptoms of liver injury like dark yellow or brown urine; general ill feeling or flu-like  symptoms; light-colored stools; loss of appetite; nausea; right upper belly pain; unusually weak or tired; yellowing of the eyes or skin  signs and symptoms of low blood pressure like dizziness; feeling faint or lightheaded, falls; unusually weak or tired  stomach pain  swelling of the ankles, feet, hands  unusual bleeding or bruising  vomiting Side effects that usually do not require medical attention (report to your doctor or health care professional if they continue or are bothersome):  headache  joint pain  muscle cramps or muscle pain  nausea  tiredness This list may not describe all possible side effects. Call your doctor for medical advice about side effects. You may report side effects to FDA at 1-800-FDA-1088. Where should I keep my medicine? This drug is given in a hospital or clinic and will not be stored at home. NOTE: This sheet is a summary. It may not cover all  possible information. If you have questions about this medicine, talk to your doctor, pharmacist, or health care provider.  2020 Elsevier/Gold Standard (2018-11-28 22:01:36) Dexamethasone injection What is this medicine? DEXAMETHASONE (dex a METH a sone) is a corticosteroid. It is used to treat inflammation of the skin, joints, lungs, and other organs. Common conditions treated include asthma, allergies, and arthritis. It is also used for other conditions, like blood disorders and diseases of the adrenal glands. This medicine may be used for other purposes; ask your health care provider or pharmacist if you have questions. COMMON BRAND NAME(S): Decadron, DoubleDex, Simplist Dexamethasone, Solurex What should I tell my health care provider before I take this medicine? They need to know if you have any of these conditions:  Cushing's syndrome  diabetes  glaucoma  heart disease  high blood pressure  infection like herpes, measles, tuberculosis, or chickenpox  kidney disease  liver disease  mental illness  myasthenia gravis  osteoporosis  previous heart attack  seizures  stomach or intestine problems  thyroid disease  an unusual or allergic reaction to dexamethasone, corticosteroids, other medicines, lactose, foods, dyes, or preservatives  pregnant or trying to get pregnant  breast-feeding How should I use this medicine? This medicine is for injection into a muscle, joint, lesion, soft tissue, or vein. It is given by a health care professional in a hospital or clinic setting. Talk to your pediatrician regarding the use of this medicine in children. Special care may be needed. Overdosage: If you think you have taken too much of this medicine contact a poison control center or emergency room at once. NOTE: This medicine is only for you. Do not share this medicine with others. What if I miss a dose? This may not apply. If you are having a series of injections over a  prolonged period, try not to miss an appointment. Call your doctor or health care professional to reschedule if you are unable to keep an appointment. What may interact with this medicine? Do not take this medicine with any of the following medications:  live virus vaccines This medicine may also interact with the following medications:  aminoglutethimide  amphotericin B  aspirin and aspirin-like medicines  certain antibiotics like erythromycin, clarithromycin, and troleandomycin  certain antivirals for HIV or hepatitis  certain medicines for seizures like carbamazepine, phenobarbital, phenytoin  certain medicines to treat myasthenia gravis  cholestyramine  cyclosporine  digoxin  diuretics  ephedrine  female hormones, like estrogen or progestins and birth control pills  insulin or other medicines for diabetes  isoniazid  ketoconazole  medicines that relax muscles  for surgery  mifepristone  NSAIDs, medicines for pain and inflammation, like ibuprofen or naproxen  rifampin  skin tests for allergies  thalidomide  vaccines  warfarin This list may not describe all possible interactions. Give your health care provider a list of all the medicines, herbs, non-prescription drugs, or dietary supplements you use. Also tell them if you smoke, drink alcohol, or use illegal drugs. Some items may interact with your medicine. What should I watch for while using this medicine? Visit your health care professional for regular checks on your progress. Tell your health care professional if your symptoms do not start to get better or if they get worse. Your condition will be monitored carefully while you are receiving this medicine. Wear a medical ID bracelet or chain. Carry a card that describes your disease and details of your medicine and dosage times. This medicine may increase your risk of getting an infection. Call your health care professional for advice if you get a fever,  chills, or sore throat, or other symptoms of a cold or flu. Do not treat yourself. Try to avoid being around people who are sick. Call your health care professional if you are around anyone with measles, chickenpox, or if you develop sores or blisters that do not heal properly. If you are going to need surgery or other procedures, tell your doctor or health care professional that you have taken this medicine within the last 12 months. Ask your doctor or health care professional about your diet. You may need to lower the amount of salt you eat. This medicine may increase blood sugar. Ask your healthcare provider if changes in diet or medicines are needed if you have diabetes. What side effects may I notice from receiving this medicine? Side effects that you should report to your doctor or health care professional as soon as possible:  allergic reactions like skin rash, itching or hives, swelling of the face, lips, or tongue  bloody or black, tarry stools  changes in emotions or moods  changes in vision  confusion, excitement, restlessness  depressed mood  eye pain  hallucinations  muscle weakness  severe or sudden stomach or belly pain  signs and symptoms of high blood sugar such as being more thirsty or hungry or having to urinate more than normal. You may also feel very tired or have blurry vision.  signs and symptoms of infection like fever; chills; cough; sore throat; pain or trouble passing urine  swelling of ankles, feet  unusual bruising or bleeding  wounds that do not heal Side effects that usually do not require medical attention (report to your doctor or health care professional if they continue or are bothersome):  increased appetite  increased growth of face or body hair  headache  nausea, vomiting  pain, redness, or irritation at site where injected  skin problems, acne, thin and shiny skin  trouble sleeping  weight gain This list may not describe all  possible side effects. Call your doctor for medical advice about side effects. You may report side effects to FDA at 1-800-FDA-1088. Where should I keep my medicine? This medicine is given in a hospital or clinic and will not be stored at home. NOTE: This sheet is a summary. It may not cover all possible information. If you have questions about this medicine, talk to your doctor, pharmacist, or health care provider.  2020 Elsevier/Gold Standard (2019-04-30 13:51:58) Palonosetron Injection What is this medicine? PALONOSETRON (pal oh NOE se tron) is used to  prevent nausea and vomiting caused by chemotherapy. It also helps prevent delayed nausea and vomiting that may occur a few days after your treatment. This medicine may be used for other purposes; ask your health care provider or pharmacist if you have questions. COMMON BRAND NAME(S): Aloxi What should I tell my health care provider before I take this medicine? They need to know if you have any of these conditions:  an unusual or allergic reaction to palonosetron, dolasetron, granisetron, ondansetron, other medicines, foods, dyes, or preservatives  pregnant or trying to get pregnant  breast-feeding How should I use this medicine? This medicine is for infusion into a vein. It is given by a health care professional in a hospital or clinic setting. Talk to your pediatrician regarding the use of this medicine in children. While this drug may be prescribed for children as young as 1 month for selected conditions, precautions do apply. Overdosage: If you think you have taken too much of this medicine contact a poison control center or emergency room at once. NOTE: This medicine is only for you. Do not share this medicine with others. What if I miss a dose? This does not apply. What may interact with this medicine?  certain medicines for depression, anxiety, or psychotic disturbances  fentanyl  linezolid  MAOIs like Carbex, Eldepryl,  Marplan, Nardil, and Parnate  methylene blue (injected into a vein)  tramadol This list may not describe all possible interactions. Give your health care provider a list of all the medicines, herbs, non-prescription drugs, or dietary supplements you use. Also tell them if you smoke, drink alcohol, or use illegal drugs. Some items may interact with your medicine. What should I watch for while using this medicine? Your condition will be monitored carefully while you are receiving this medicine. What side effects may I notice from receiving this medicine? Side effects that you should report to your doctor or health care professional as soon as possible:  allergic reactions like skin rash, itching or hives, swelling of the face, lips, or tongue  breathing problems  confusion  dizziness  fast, irregular heartbeat  fever and chills  loss of balance or coordination  seizures  sweating  swelling of the hands and feet  tremors  unusually weak or tired Side effects that usually do not require medical attention (report to your doctor or health care professional if they continue or are bothersome):  constipation or diarrhea  headache This list may not describe all possible side effects. Call your doctor for medical advice about side effects. You may report side effects to FDA at 1-800-FDA-1088. Where should I keep my medicine? This drug is given in a hospital or clinic and will not be stored at home. NOTE: This sheet is a summary. It may not cover all possible information. If you have questions about this medicine, talk to your doctor, pharmacist, or health care provider.  2020 Elsevier/Gold Standard (2013-08-23 10:38:36) Acetaminophen tablets or caplets Qu es este medicamento? El ACETAMINOFENO es un analgsico. Se utiliza para tratar los dolores leves y para Engineer, materials fiebre. Este medicamento puede ser utilizado para otros usos; si tiene alguna pregunta consulte con su proveedor  de atencin mdica o con su farmacutico. MARCAS COMUNES: Aceta, Actamin, Anacin Aspirin Free, Genapap, Genebs, Mapap, Pain & Fever, Pain and Fever, PAIN RELIEF, PAIN RELIEF Extra Strength, Pain Reliever, Panadol, PHARBETOL, Q-Pap, Q-Pap Extra Strength, Tylenol, Tylenol CrushableTablet, Tylenol Extra Strength, XS No Aspirin, XS Pain Reliever Qu le debo informar a mi profesional  de la salud antes de tomar este medicamento? Necesita saber si usted presenta alguno de los siguientes problemas o situaciones:  si consume alcohol con frecuencia  enfermedad heptica  una reaccin alrgica o inusual al acetaminofeno, a otros medicamentos, alimentos, colorantes o conservantes  si est embarazada o buscando quedar embarazada  si est amamantando a un beb Cmo debo utilizar este medicamento? Tome este medicamento por va oral con un vaso de agua. Siga las instrucciones de la etiqueta o del envase del medicamento. Tome sus dosis a intervalos regulares. No tome su medicamento con una frecuencia mayor a la indicada. Hable con su pediatra para informarse acerca del uso de este medicamento en nios. Aunque este medicamento ha sido recetado a nios tan menores como de 6 aos de edad para condiciones selectivas, las precauciones se aplican. Sobredosis: Pngase en contacto inmediatamente con un centro toxicolgico o una sala de urgencia si usted cree que haya tomado demasiado medicamento. ATENCIN: [employer] es solo para usted. No comparta este medicamento con nadie. Qu sucede si me olvido de una dosis? Si olvida una dosis, tmela lo antes posible. Si es casi la hora de la prxima dosis, tome slo esa dosis. No tome dosis adicionales o dobles. Qu puede interactuar con este medicamento?  alcohol  imatinib  isoniazida  otros medicamentos con acetaminofeno Puede ser que esta lista no menciona todas las posibles interacciones. Informe a su profesional de [employer]	[place] de [employer] productos a base de  hierbas, medicamentos de [place] o suplementos nutritivos que est tomando. Si usted fuma, consume bebidas alcohlicas o si utiliza drogas ilegales, indqueselo tambin a su profesional de [employer]	[place]. Algunas sustancias pueden interactuar con su medicamento. A qu debo estar atento al usar [employer]? Informe a su mdico o profesional de la salud si el dolor dura ms de 10 das (5 das en nios), si [place], o si tiene un dolor nuevo o diferente. Tambin consulte con su mdico si la fiebre dura ms de 3 das. No tome con este medicamento otros medicamentos que contengan acetaminofeno. Siempre lea atentamente las etiquetas. Si tiene [place] duda, pregntele a su mdico o farmacutico. Si toma demasiado acetaminofeno, busque ayuda mdica de inmediato. Demasiado acetaminofeno puede ser muy peligroso y causar dao heptico. Incluso si no tiene sntomas, es importante obtener ayuda de inmediato. Qu efectos secundarios puedo tener al [employer] este medicamento? Efectos secundarios que debe informar a su mdico o a Barrister's clerk de la salud tan pronto como sea posible:  Chief of Staff como erupcin cutnea, picazn o urticarias, hinchazn de la cara, labios o lengua  problemas respiratorios  fiebre o dolor de garganta  enrojecimiento, formacin de ampollas, descamacin o distensin de la piel, inclusive dentro de la boca  dificultad para orinar o cambios en el volumen de orina  sangrado o magulladuras inusuales  cansancio o debilidad inusual  color amarillento de ojos o piel Efectos secundarios que, por lo general, no requieren atencin mdica (debe informarlos a su mdico o a su profesional de la salud si persisten o si son molestos):  dolor de cabeza  nuseas, Higher education careers adviser Puede ser que esta lista no menciona todos los posibles efectos secundarios. Comunquese a su mdico por asesoramiento mdico [employer] Usted puede informar los efectos secundarios  a la FDA por telfono al 1-800-FDA-1088. Dnde debo guardar mi medicina? Mantngala fuera del alcance de los nios. Gurdela a [employer], entre 20 y 53 grados C (49 y 29 grados F). Protjala de la humedad  y del calor. Deseche todo el medicamento que no haya utilizado, despus de la fecha de vencimiento. ATENCIN: Este folleto es un resumen. Puede ser que no cubra toda la posible informacin. Si usted tiene preguntas acerca de esta medicina, consulte con su mdico, su farmacutico o su profesional de Technical sales engineer.  2020 Elsevier/Gold Standard (2016-11-17 00:00:00) Diphenhydramine capsules or tablets What is this medicine? DIPHENHYDRAMINE (dye fen HYE dra meen) is an antihistamine. It is used to treat the symptoms of an allergic reaction. It is also used to treat Parkinson's disease. This medicine is also used to prevent and to treat motion sickness and as a nighttime sleep aid. This medicine may be used for other purposes; ask your health care provider or pharmacist if you have questions. COMMON BRAND NAME(S): Alka-Seltzer Plus Allergy, Aller-G-Time, Banophen, Benadryl Allergy, Benadryl Allergy Dye Free, Benadryl Allergy Kapgel, Benadryl Allergy Ultratab, Diphedryl, Diphenhist, Genahist, Geri-Dryl, PHARBEDRYL, Q-Dryl, Gretta Began, Valu-Dryl, Vicks ZzzQuil Nightime Sleep-Aid What should I tell my health care provider before I take this medicine? They need to know if you have any of these conditions:  asthma or lung disease  glaucoma  high blood pressure or heart disease  liver disease  pain or difficulty passing urine  prostate trouble  ulcers or other stomach problems  an unusual or allergic reaction to diphenhydramine, other medicines foods, dyes, or preservatives such as sulfites  pregnant or trying to get pregnant  breast-feeding How should I use this medicine? Take this medicine by mouth with a full glass of water. Follow the directions on the prescription label. Take your  doses at regular intervals. Do not take your medicine more often than directed. To prevent motion sickness start taking this medicine 30 to 60 minutes before you leave. Talk to your pediatrician regarding the use of this medicine in children. Special care may be needed. Patients over 17 years old may have a stronger reaction and need a smaller dose. Overdosage: If you think you have taken too much of this medicine contact a poison control center or emergency room at once. NOTE: This medicine is only for you. Do not share this medicine with others. What if I miss a dose? If you miss a dose, take it as soon as you can. If it is almost time for your next dose, take only that dose. Do not take double or extra doses. What may interact with this medicine? Do not take this medicine with any of the following medications:  MAOIs like Carbex, Eldepryl, Marplan, Nardil, and Parnate This medicine may also interact with the following medications:  alcohol  barbiturates, like phenobarbital  medicines for bladder spasm like oxybutynin, tolterodine  medicines for blood pressure  medicines for depression, anxiety, or psychotic disturbances  medicines for movement abnormalities or Parkinson's disease  medicines for sleep  other medicines for cold, cough or allergy  some medicines for the stomach like chlordiazepoxide, dicyclomine This list may not describe all possible interactions. Give your health care provider a list of all the medicines, herbs, non-prescription drugs, or dietary supplements you use. Also tell them if you smoke, drink alcohol, or use illegal drugs. Some items may interact with your medicine. What should I watch for while using this medicine? Visit your doctor or health care professional for regular check ups. Tell your doctor if your symptoms do not improve or if they get worse. Your mouth may get dry. Chewing sugarless gum or sucking hard candy, and drinking plenty of water may  help. Contact your doctor  if the problem does not go away or is severe. This medicine may cause dry eyes and blurred vision. If you wear contact lenses you may feel some discomfort. Lubricating drops may help. See your eye doctor if the problem does not go away or is severe. You may get drowsy or dizzy. Do not drive, use machinery, or do anything that needs mental alertness until you know how this medicine affects you. Do not stand or sit up quickly, especially if you are an older patient. This reduces the risk of dizzy or fainting spells. Alcohol may interfere with the effect of this medicine. Avoid alcoholic drinks. What side effects may I notice from receiving this medicine? Side effects that you should report to your doctor or health care professional as soon as possible:  allergic reactions like skin rash, itching or hives, swelling of the face, lips, or tongue  changes in vision  confused, agitated, nervous  irregular or fast heartbeat  tremor  trouble passing urine  unusual bleeding or bruising  unusually weak or tired Side effects that usually do not require medical attention (report to your doctor or health care professional if they continue or are bothersome):  constipation, diarrhea  drowsy  headache  loss of appetite  stomach upset, vomiting  thick mucous This list may not describe all possible side effects. Call your doctor for medical advice about side effects. You may report side effects to FDA at 1-800-FDA-1088. Where should I keep my medicine? Keep out of the reach of children. This medicine can be abused. Keep your medicine in a safe place. Store at room temperature between 15 and 30 degrees C (59 and 86 degrees F). Keep container closed tightly. Throw away any unused medicine after the expiration date. NOTE: This sheet is a summary. It may not cover all possible information. If you have questions about this medicine, talk to your doctor, pharmacist, or health  care provider.  2020 Elsevier/Gold Standard (2019-07-26 10:18:35)

## 2020-04-09 NOTE — Telephone Encounter (Signed)
Appointments scheduled and calendar was printed for patient per 6/10 los

## 2020-04-09 NOTE — Telephone Encounter (Signed)
Dr. Marin Olp notified of potassium-3.0. Order received for pt to take potassium 40 meq PO now and potassium 40 meq PO prior to discharge today per Dr. Marin Olp.

## 2020-04-09 NOTE — Progress Notes (Signed)
Hematology and Oncology Follow Up Visit  Valerie Cline [id number] [birth year] 64 y.o. 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  She is doing quite well.  We went ahead and did a CT scan of her body a week ago.  The CT scan thankfully showed a pretty much complete response.  All lymph nodes had regressed in size to less than 1 cm.  She had no splenomegaly.  She really is doing quite nicely.  Her blood sugars are on the high side but this was not too much for problem.  She has had no issues with bowels or bladder.  She has had no headache.  She has had no cough or shortness of breath.  She has had no swelling in the legs.  She has had no rashes.   Currently, her performance status is ECOG 0.    Medications:  Current Outpatient Medications:  .  allopurinol (ZYLOPRIM) 100 MG tablet, TAKE 1 TABLET(100 MG) BY MOUTH DAILY, Disp: 30 tablet, Rfl: 0 .  amLODipine (NORVASC) 2.5 MG tablet, Take 2.5 mg by mouth at bedtime., Disp: , Rfl:  .  aspirin EC 81 MG tablet, Take 81 mg by mouth at bedtime., Disp: , Rfl:  .  benazepril (LOTENSIN) 20 MG tablet, Take 20 mg by mouth at bedtime. , Disp: , Rfl:  .  Dulaglutide (TRULICITY) 1.5 UR/4.2HC SOPN, Inject 1.5 mg into the skin every Tuesday. , Disp: , Rfl:  .  famciclovir (FAMVIR) 500 MG tablet, Take 1 tablet (500 mg total) by mouth daily., Disp: 30 tablet, Rfl: 8 .  hydrochlorothiazide 25 MG tablet, Take 25 mg by mouth at bedtime. , Disp: , Rfl:  .  insulin regular human CONCENTRATED (HUMULIN R) 500 UNIT/ML SOLN injection, Inject 50-70 Units into the skin See admin instructions. Inject 70 units subcutaneously before breakfast, and 50 units before lunch and supper - drawn on a U-500 syringe, Disp: , Rfl:  .  LORazepam (ATIVAN) 0.5 MG tablet, Take 1 tablet (0.5 mg  total) by mouth every 6 (six) hours as needed (Nausea or vomiting)., Disp: 30 tablet, Rfl: 0 .  ondansetron (ZOFRAN) 8 MG tablet, Take 1 tablet (8 mg total) by mouth 2 (two) times daily as needed for refractory nausea / vomiting. Start on day 2 after bendamustine chemo., Disp: 30 tablet, Rfl: 1 .  prochlorperazine (COMPAZINE) 10 MG tablet, Take 1 tablet (10 mg total) by mouth every 6 (six) hours as needed (Nausea or vomiting)., Disp: 30 tablet, Rfl: 1 .  rosuvastatin (CRESTOR) 10 MG tablet, Take 10 mg by mouth at bedtime., Disp: , Rfl:   Current Facility-Administered Medications:  .  0.9 %  sodium chloride infusion, 500 mL, Intravenous, Continuous, Irene Shipper, MD  Allergies: No Known Allergies  Past Medical History, Surgical history, Social history, and Family History were reviewed and updated.  Review of Systems: Review of Systems  Constitutional: Negative.   HENT:  Negative.   Eyes: Negative.   Respiratory: Negative.   Cardiovascular: Negative.   Gastrointestinal: Negative.   Endocrine: Negative.   Genitourinary: Negative.    Musculoskeletal: Negative.   Skin: Negative.   Neurological: Negative.   Hematological: Negative.   Psychiatric/Behavioral: Negative.     Physical Exam:  weight is 173 lb 8 oz (78.7 kg). Her  oral temperature is 98.4 F (36.9 C). Her blood pressure is 147/54 (abnormal) and her pulse is 77. Her respiration is 18 and oxygen saturation is 98%.   Wt Readings from Last 3 Encounters:  04/09/20 173 lb 8 oz (78.7 kg)  03/12/20 175 lb (79.4 kg)  02/11/20 179 lb (81.2 kg)    Physical Exam Vitals reviewed.  HENT:     Head: Normocephalic and atraumatic.  Eyes:     Pupils: Pupils are equal, round, and reactive to light.  Cardiovascular:     Rate and Rhythm: Normal rate and regular rhythm.     Heart sounds: Normal heart sounds.  Pulmonary:     Effort: Pulmonary effort is normal.     Breath sounds: Normal breath sounds.  Abdominal:     General: Bowel  sounds are normal.     Palpations: Abdomen is soft.  Musculoskeletal:        General: No tenderness or deformity. Normal range of motion.     Cervical back: Normal range of motion.  Lymphadenopathy:     Cervical: No cervical adenopathy.  Skin:    General: Skin is warm and dry.     Findings: No erythema or rash.  Neurological:     Mental Status: She is alert and oriented to person, place, and time.  Psychiatric:        Behavior: Behavior normal.        Thought Content: Thought content normal.        Judgment: Judgment normal.      Lab Results  Component Value Date   WBC 7.8 04/09/2020   HGB 13.3 04/09/2020   HCT 40.1 04/09/2020   MCV 75.7 (L) 04/09/2020   PLT 184 04/09/2020     Chemistry      Component Value Date/Time   NA 141 03/12/2020 0745   K 3.4 (L) 03/12/2020 0745   CL 100 03/12/2020 0745   CO2 31 03/12/2020 0745   BUN 9 03/12/2020 0745   CREATININE 0.64 03/12/2020 0745      Component Value Date/Time   CALCIUM 9.6 03/12/2020 0745   ALKPHOS 60 03/12/2020 0745   AST 25 03/12/2020 0745   ALT 32 03/12/2020 0745   BILITOT 0.7 03/12/2020 0745      Impression and Plan: Valerie Cline is a 64 year old white female.  She has small lymphocytic B cell non-Hodgkin's lymphoma.  We will go ahead with the third cycle of treatment.  I probably will plan for total 6 cycles of treatment and then we will get her on maintenance Rituxan every 2 months.  I am just glad that her quality life is doing well.  She and her family are going to the beach the first week in July.  I know that they will have a good time.  She will drink a lot of water and wear sunscreen.  .  We will plan to get her back in 1 month.   Volanda Napoleon, MD 6/10/20218:24 AM

## 2020-04-09 NOTE — Addendum Note (Signed)
Addended by: Volanda Napoleon on: 04/09/2020 09:43 AM   Modules accepted: Orders

## 2020-04-09 NOTE — Progress Notes (Unsigned)
Patient has tolerated previous cycles of rituximab without incident. Okay to change to rapid infusion per Dr. Marin Olp.

## 2020-04-10 ENCOUNTER — Inpatient Hospital Stay: Payer: Medicare Other

## 2020-04-10 VITALS — BP 137/55 | HR 76 | Temp 97.8°F | Resp 18

## 2020-04-10 DIAGNOSIS — Z5111 Encounter for antineoplastic chemotherapy: Secondary | ICD-10-CM | POA: Diagnosis not present

## 2020-04-10 DIAGNOSIS — C8308 Small cell B-cell lymphoma, lymph nodes of multiple sites: Secondary | ICD-10-CM

## 2020-04-10 LAB — IGG, IGA, IGM
IgA: 109 mg/dL (ref 87–352)
IgG (Immunoglobin G), Serum: 694 mg/dL (ref 586–1602)
IgM (Immunoglobulin M), Srm: 199 mg/dL (ref 26–217)

## 2020-04-10 MED ORDER — SODIUM CHLORIDE 0.9 % IV SOLN
10.0000 mg | Freq: Once | INTRAVENOUS | Status: AC
  Administered 2020-04-10: 10 mg via INTRAVENOUS
  Filled 2020-04-10: qty 10

## 2020-04-10 MED ORDER — SODIUM CHLORIDE 0.9 % IV SOLN
90.0000 mg/m2 | Freq: Once | INTRAVENOUS | Status: AC
  Administered 2020-04-10: 175 mg via INTRAVENOUS
  Filled 2020-04-10: qty 7

## 2020-04-10 MED ORDER — SODIUM CHLORIDE 0.9 % IV SOLN
Freq: Once | INTRAVENOUS | Status: AC
  Filled 2020-04-10: qty 250

## 2020-04-13 ENCOUNTER — Encounter: Payer: Self-pay | Admitting: *Deleted

## 2020-04-13 NOTE — Progress Notes (Signed)
Oncology Nurse Navigator Documentation  Oncology Nurse Navigator Flowsheets 03/13/2020  Abnormal Finding Date -  Confirmed Diagnosis Date -  Diagnosis Status -  Planned Course of Treatment -  Phase of Treatment -  Chemotherapy Pending- Reason: -  Chemotherapy Actual Start Date: -  Navigator Follow Up Date: 04/09/2020  Navigator Follow Up Reason: Follow-up Appointment  Navigator Location [facility]  Referral Date to RadOnc/MedOnc -  Navigator Encounter Type Appt/Treatment Plan Review  Telephone -  Treatment Initiated Date -  Patient Visit Type -  Treatment Phase -  Barriers/Navigation Needs No Needs  Education -  Interventions -  Acuity -  Referrals -  Coordination of Care -  Education Method -  Support Groups/Services -  Time Spent with Patient 15

## 2020-04-14 ENCOUNTER — Telehealth: Payer: Self-pay | Admitting: Hematology & Oncology

## 2020-04-14 NOTE — Telephone Encounter (Signed)
Ennever on PAL 7/15 updated letter/appt info mailed   

## 2020-04-20 ENCOUNTER — Other Ambulatory Visit: Payer: Self-pay | Admitting: Hematology & Oncology

## 2020-04-20 DIAGNOSIS — C8308 Small cell B-cell lymphoma, lymph nodes of multiple sites: Secondary | ICD-10-CM

## 2020-04-23 ENCOUNTER — Inpatient Hospital Stay: Admission: RE | Admit: 2020-04-23 | Payer: Medicare Other | Source: Ambulatory Visit

## 2020-04-30 ENCOUNTER — Other Ambulatory Visit: Payer: Self-pay

## 2020-04-30 ENCOUNTER — Ambulatory Visit (HOSPITAL_COMMUNITY): Payer: Medicare Other

## 2020-04-30 ENCOUNTER — Ambulatory Visit (HOSPITAL_COMMUNITY)
Admission: RE | Admit: 2020-04-30 | Discharge: 2020-04-30 | Disposition: A | Discharge status: Home / Self Care | Payer: Self-pay | Source: Ambulatory Visit | Attending: Hematology & Oncology | Admitting: Hematology & Oncology

## 2020-04-30 DIAGNOSIS — I2510 Atherosclerotic heart disease of native coronary artery without angina pectoris: Secondary | ICD-10-CM | POA: Diagnosis not present

## 2020-04-30 DIAGNOSIS — I10 Essential (primary) hypertension: Secondary | ICD-10-CM | POA: Diagnosis not present

## 2020-05-01 ENCOUNTER — Encounter: Payer: Self-pay | Admitting: *Deleted

## 2020-05-01 NOTE — Progress Notes (Signed)
Patient called asking about her Cardiac Scoring Scan. Spoke to Dr Marin Olp who would like the scan results be sent to patient's PCP for further follow up and/or management.  Confirmed with patient her PCP - Dr Brigitte Pulse - and report sent. Patient aware of plan.

## 2020-05-06 ENCOUNTER — Ambulatory Visit: Payer: Medicare Other | Admitting: Hematology & Oncology

## 2020-05-06 ENCOUNTER — Ambulatory Visit: Payer: Medicare Other

## 2020-05-06 ENCOUNTER — Other Ambulatory Visit: Payer: Medicare Other

## 2020-05-07 ENCOUNTER — Ambulatory Visit: Payer: Medicare Other

## 2020-05-07 ENCOUNTER — Other Ambulatory Visit: Payer: Medicare Other

## 2020-05-07 ENCOUNTER — Ambulatory Visit: Payer: Medicare Other | Admitting: Hematology & Oncology

## 2020-05-08 ENCOUNTER — Ambulatory Visit: Payer: Medicare Other

## 2020-05-11 ENCOUNTER — Other Ambulatory Visit: Payer: Self-pay | Admitting: Hematology & Oncology

## 2020-05-13 ENCOUNTER — Other Ambulatory Visit: Payer: Medicare Other

## 2020-05-13 ENCOUNTER — Ambulatory Visit: Payer: Medicare Other | Admitting: Hematology & Oncology

## 2020-05-13 ENCOUNTER — Ambulatory Visit: Payer: Medicare Other

## 2020-05-14 ENCOUNTER — Ambulatory Visit: Payer: Medicare Other

## 2020-05-14 ENCOUNTER — Encounter: Payer: Self-pay | Admitting: *Deleted

## 2020-05-14 ENCOUNTER — Inpatient Hospital Stay (HOSPITAL_BASED_OUTPATIENT_CLINIC_OR_DEPARTMENT_OTHER): Payer: Medicare Other | Admitting: Family

## 2020-05-14 ENCOUNTER — Telehealth: Payer: Self-pay | Admitting: Family

## 2020-05-14 ENCOUNTER — Inpatient Hospital Stay: Payer: Medicare Other

## 2020-05-14 ENCOUNTER — Inpatient Hospital Stay: Payer: Medicare Other | Attending: Hematology & Oncology

## 2020-05-14 ENCOUNTER — Other Ambulatory Visit: Payer: Self-pay

## 2020-05-14 VITALS — BP 133/56 | HR 75 | Temp 98.4°F | Resp 16 | Ht 66.0 in | Wt 173.0 lb

## 2020-05-14 VITALS — BP 116/49 | HR 69 | Temp 98.6°F | Resp 16

## 2020-05-14 DIAGNOSIS — Z79899 Other long term (current) drug therapy: Secondary | ICD-10-CM | POA: Insufficient documentation

## 2020-05-14 DIAGNOSIS — Z5112 Encounter for antineoplastic immunotherapy: Secondary | ICD-10-CM | POA: Diagnosis not present

## 2020-05-14 DIAGNOSIS — C8308 Small cell B-cell lymphoma, lymph nodes of multiple sites: Secondary | ICD-10-CM

## 2020-05-14 DIAGNOSIS — Z5111 Encounter for antineoplastic chemotherapy: Secondary | ICD-10-CM | POA: Insufficient documentation

## 2020-05-14 LAB — CBC WITH DIFFERENTIAL (CANCER CENTER ONLY)
Abs Immature Granulocytes: 0.01 10*3/uL (ref 0.00–0.07)
Basophils Absolute: 0 10*3/uL (ref 0.0–0.1)
Basophils Relative: 1 %
Eosinophils Absolute: 1 10*3/uL — ABNORMAL HIGH (ref 0.0–0.5)
Eosinophils Relative: 17 %
HCT: 40 % (ref 36.0–46.0)
Hemoglobin: 13.4 g/dL (ref 12.0–15.0)
Immature Granulocytes: 0 %
Lymphocytes Relative: 19 %
Lymphs Abs: 1.2 10*3/uL (ref 0.7–4.0)
MCH: 26 pg (ref 26.0–34.0)
MCHC: 33.5 g/dL (ref 30.0–36.0)
MCV: 77.7 fL — ABNORMAL LOW (ref 80.0–100.0)
Monocytes Absolute: 0.8 10*3/uL (ref 0.1–1.0)
Monocytes Relative: 14 %
Neutro Abs: 3.1 10*3/uL (ref 1.7–7.7)
Neutrophils Relative %: 49 %
Platelet Count: 175 10*3/uL (ref 150–400)
RBC: 5.15 MIL/uL — ABNORMAL HIGH (ref 3.87–5.11)
RDW: 14.6 % (ref 11.5–15.5)
WBC Count: 6.2 10*3/uL (ref 4.0–10.5)
nRBC: 0 % (ref 0.0–0.2)

## 2020-05-14 LAB — CMP (CANCER CENTER ONLY)
ALT: 35 U/L (ref 0–44)
AST: 30 U/L (ref 15–41)
Albumin: 4.4 g/dL (ref 3.5–5.0)
Alkaline Phosphatase: 62 U/L (ref 38–126)
Anion gap: 17 — ABNORMAL HIGH (ref 5–15)
BUN: 12 mg/dL (ref 8–23)
CO2: 33 mmol/L — ABNORMAL HIGH (ref 22–32)
Calcium: 9.6 mg/dL (ref 8.9–10.3)
Chloride: 96 mmol/L — ABNORMAL LOW (ref 98–111)
Creatinine: 0.61 mg/dL (ref 0.44–1.00)
GFR, Est AFR Am: >60 mL/min (ref >60)
GFR, Estimated: >60 mL/min (ref >60)
Glucose, Bld: 219 mg/dL — ABNORMAL HIGH (ref 70–99)
Potassium: 3.4 mmol/L — ABNORMAL LOW (ref 3.5–5.1)
Sodium: 146 mmol/L — ABNORMAL HIGH (ref 135–145)
Total Bilirubin: 0.9 mg/dL (ref 0.3–1.2)
Total Protein: 6.6 g/dL (ref 6.5–8.1)

## 2020-05-14 LAB — LACTATE DEHYDROGENASE: LDH: 196 U/L — ABNORMAL HIGH (ref 98–192)

## 2020-05-14 MED ORDER — SODIUM CHLORIDE 0.9 % IV SOLN
90.0000 mg/m2 | Freq: Once | INTRAVENOUS | Status: AC
  Administered 2020-05-14: 175 mg via INTRAVENOUS
  Filled 2020-05-14: qty 7

## 2020-05-14 MED ORDER — PALONOSETRON HCL INJECTION 0.25 MG/5ML
0.2500 mg | Freq: Once | INTRAVENOUS | Status: AC
  Administered 2020-05-14: 0.25 mg via INTRAVENOUS

## 2020-05-14 MED ORDER — SODIUM CHLORIDE 0.9 % IV SOLN
10.0000 mg | Freq: Once | INTRAVENOUS | Status: AC
  Administered 2020-05-14: 10 mg via INTRAVENOUS
  Filled 2020-05-14: qty 10

## 2020-05-14 MED ORDER — DIPHENHYDRAMINE HCL 25 MG PO CAPS
ORAL_CAPSULE | ORAL | Status: AC
  Filled 2020-05-14: qty 2

## 2020-05-14 MED ORDER — DIPHENHYDRAMINE HCL 25 MG PO CAPS
50.0000 mg | ORAL_CAPSULE | Freq: Once | ORAL | Status: AC
  Administered 2020-05-14: 50 mg via ORAL

## 2020-05-14 MED ORDER — ACETAMINOPHEN 325 MG PO TABS
650.0000 mg | ORAL_TABLET | Freq: Once | ORAL | Status: AC
  Administered 2020-05-14: 650 mg via ORAL

## 2020-05-14 MED ORDER — SODIUM CHLORIDE 0.9 % IV SOLN
375.0000 mg/m2 | Freq: Once | INTRAVENOUS | Status: AC
  Administered 2020-05-14: 700 mg via INTRAVENOUS
  Filled 2020-05-14: qty 50

## 2020-05-14 MED ORDER — PALONOSETRON HCL INJECTION 0.25 MG/5ML
INTRAVENOUS | Status: AC
  Filled 2020-05-14: qty 5

## 2020-05-14 MED ORDER — ACETAMINOPHEN 325 MG PO TABS
ORAL_TABLET | ORAL | Status: AC
  Filled 2020-05-14: qty 2

## 2020-05-14 MED ORDER — SODIUM CHLORIDE 0.9 % IV SOLN
Freq: Once | INTRAVENOUS | Status: AC
  Filled 2020-05-14: qty 250

## 2020-05-14 NOTE — Telephone Encounter (Signed)
Already has scheduled per 7/15 los

## 2020-05-14 NOTE — Progress Notes (Signed)
Hematology and Oncology Follow Up Visit  Valerie Cline [id number] 11/11/1955 64 y.o. 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 She is doing well and has no complaints at this time.  She states that for a couple days after each cycle she may feel a little lightheaded.  She denies fatigue.  She had cardiac scoring done earlier this month and she states that her PCP has reviewed and called to go over results and plan with her but they are playing phone tag. She will let us know if they place a referral to cardiology.  No fever, chills, n/v, cough, rash, dizziness, SOB, chest pain, palpitations, abdominal pain or changes in bowel or bladder habits.  No adenopathy noted on exam.  No episodes of bleeding. No bruising or petechiae.  No swelling, tenderness, numbness or tingling in her extremities.  No falls or syncope.  She has maintained a good appetite and is staying well hydrated. Her weight is stable.   ECOG Performance Status: 0 - Asymptomatic  Medications:  Allergies as of 05/14/2020   No Known Allergies     Medication List       Accurate as of May 14, 2020  8:59 AM. If you have any questions, ask your nurse or doctor.        allopurinol 100 MG tablet Commonly known as: ZYLOPRIM TAKE 1 TABLET(100 MG) BY MOUTH DAILY   amLODipine 2.5 MG tablet Commonly known as: NORVASC Take 2.5 mg by mouth at bedtime.   aspirin EC 81 MG tablet Take 81 mg by mouth at bedtime.   benazepril 20 MG tablet Commonly known as: LOTENSIN Take 20 mg by mouth at bedtime.   famciclovir 500 MG tablet Commonly known as: FAMVIR Take 1 tablet (500 mg total) by mouth daily.   HUMULIN R 500 UNIT/ML injection Generic drug: insulin regular human CONCENTRATED Inject 50-70 Units into the  skin See admin instructions. Inject 70 units subcutaneously before breakfast, and 50 units before lunch and supper - drawn on a U-500 syringe   hydrochlorothiazide 25 MG tablet Commonly known as: HYDRODIURIL Take 25 mg by mouth at bedtime.   LORazepam 0.5 MG tablet Commonly known as: Ativan Take 1 tablet (0.5 mg total) by mouth every 6 (six) hours as needed (Nausea or vomiting).   ondansetron 8 MG tablet Commonly known as: Zofran Take 1 tablet (8 mg total) by mouth 2 (two) times daily as needed for refractory nausea / vomiting. Start on day 2 after bendamustine chemo.   prochlorperazine 10 MG tablet Commonly known as: COMPAZINE Take 1 tablet (10 mg total) by mouth every 6 (six) hours as needed (Nausea or vomiting).   rosuvastatin 10 MG tablet Commonly known as: CRESTOR Take 10 mg by mouth at bedtime.   Trulicity 1.5 WP/8.0DX Sopn Generic drug: Dulaglutide Inject 1.5 mg into the skin every Tuesday.       Allergies: No Known Allergies  Past Medical History, Surgical history, Social history, and Family History were reviewed and updated.  Review of Systems: All other 10 point review of systems is negative.   Physical Exam:  height is 5\' 6"  (1.676 m) and weight is 173 lb (78.5 kg). Her oral temperature is 98.4 F (36.9 C). Her blood pressure is 133/56 (abnormal) and her  pulse is 75. Her respiration is 16 and oxygen saturation is 100%.   Wt Readings from Last 3 Encounters:  05/14/20 173 lb (78.5 kg)  04/09/20 173 lb 8 oz (78.7 kg)  03/12/20 175 lb (79.4 kg)    Ocular: Sclerae unicteric, pupils equal, round and reactive to light Ear-nose-throat: Oropharynx clear, dentition fair Lymphatic: No cervical, supraclavicular or axillary adenopathy Lungs no rales or rhonchi, good excursion bilaterally Heart regular rate and rhythm, no murmur appreciated Abd soft, nontender, positive bowel sounds, no liver or spleen tip palpated on exam, no fluid wave  MSK no focal spinal  tenderness, no joint edema Neuro: non-focal, well-oriented, appropriate affect Breasts: Deferred   Lab Results  Component Value Date   WBC 6.2 05/14/2020   HGB 13.4 05/14/2020   HCT 40.0 05/14/2020   MCV 77.7 (L) 05/14/2020   PLT 175 05/14/2020   Lab Results  Component Value Date   FERRITIN 305 11/11/2019   Lab Results  Component Value Date   RBC 5.15 (H) 05/14/2020   Lab Results  Component Value Date   KPAFRELGTCHN 131.9 (H) 12/30/2019   LAMBDASER 29.8 (H) 12/30/2019   KAPLAMBRATIO 4.43 (H) 12/30/2019   Lab Results  Component Value Date   IGGSERUM 694 04/09/2020   IGA 109 04/09/2020   IGMSERUM 199 04/09/2020   Lab Results  Component Value Date   TOTALPROTELP 6.6 12/30/2019   ALBUMINELP 3.8 12/30/2019   A1GS 0.2 12/30/2019   A2GS 0.7 12/30/2019   BETS 0.9 12/30/2019   GAMS 1.0 12/30/2019   MSPIKE Not Observed 12/30/2019     Chemistry      Component Value Date/Time   NA 146 (H) 05/14/2020 0748   K 3.4 (L) 05/14/2020 0748   CL 96 (L) 05/14/2020 0748   CO2 33 (H) 05/14/2020 0748   BUN 12 05/14/2020 0748   CREATININE 0.61 05/14/2020 0748      Component Value Date/Time   CALCIUM 9.6 05/14/2020 0748   ALKPHOS 62 05/14/2020 0748   AST 30 05/14/2020 0748   ALT 35 05/14/2020 0748   BILITOT 0.9 05/14/2020 0748       Impression and Plan: Valerie Cline is a very pleasant 64 yo caucasian female with small lymphocytic B cell non-Hodgkin's lymphoma.  She is tolerating treatment nicely so far and will proceed with cycle 4/6 today as planned.  We will plan to see her again in a month. She has her current schedule through August. She will contact our office with any questions or concerns. We can certainly see her sooner if needed.   Laverna Peace, NP 7/15/20218:59 AM

## 2020-05-14 NOTE — Patient Instructions (Signed)
Bendamustine Injection What is this medicine? BENDAMUSTINE (BEN da MUS teen) is a chemotherapy drug. It is used to treat chronic lymphocytic leukemia and non-Hodgkin lymphoma. This medicine may be used for other purposes; ask your health care provider or pharmacist if you have questions. COMMON BRAND NAME(S): BELRAPZO, BENDEKA, Treanda What should I tell my health care provider before I take this medicine? They need to know if you have any of these conditions:  infection (especially a virus infection such as chickenpox, cold sores, or herpes)  kidney disease  liver disease  an unusual or allergic reaction to bendamustine, mannitol, other medicines, foods, dyes, or preservatives  pregnant or trying to get pregnant  breast-feeding How should I use this medicine? This medicine is for infusion into a vein. It is given by a health care professional in a hospital or clinic setting. Talk to your pediatrician regarding the use of this medicine in children. Special care may be needed. Overdosage: If you think you have taken too much of this medicine contact a poison control center or emergency room at once. NOTE: This medicine is only for you. Do not share this medicine with others. What if I miss a dose? It is important not to miss your dose. Call your doctor or health care professional if you are unable to keep an appointment. What may interact with this medicine? Do not take this medicine with any of the following medications:  clozapine This medicine may also interact with the following medications:  atazanavir  cimetidine  ciprofloxacin  enoxacin  fluvoxamine  medicines for seizures like carbamazepine and phenobarbital  mexiletine  rifampin  tacrine  thiabendazole  zileuton This list may not describe all possible interactions. Give your health care provider a list of all the medicines, herbs, non-prescription drugs, or dietary supplements you use. Also tell them if  you smoke, drink alcohol, or use illegal drugs. Some items may interact with your medicine. What should I watch for while using this medicine? This drug may make you feel generally unwell. This is not uncommon, as chemotherapy can affect healthy cells as well as cancer cells. Report any side effects. Continue your course of treatment even though you feel ill unless your doctor tells you to stop. You may need blood work done while you are taking this medicine. Call your doctor or healthcare provider for advice if you get a fever, chills or sore throat, or other symptoms of a cold or flu. Do not treat yourself. This drug decreases your body's ability to fight infections. Try to avoid being around people who are sick. This medicine may cause serious skin reactions. They can happen weeks to months after starting the medicine. Contact your healthcare provider right away if you notice fevers or flu-like symptoms with a rash. The rash may be red or purple and then turn into blisters or peeling of the skin. Or, you might notice a red rash with swelling of the face, lips or lymph nodes in your neck or under your arms. This medicine may increase your risk to bruise or bleed. Call your doctor or healthcare provider if you notice any unusual bleeding. Talk to your doctor about your risk of cancer. You may be more at risk for certain types of cancers if you take this medicine. Do not become pregnant while taking this medicine or for at least 6 months after stopping it. Women should inform their doctor if they wish to become pregnant or think they might be pregnant. Men should not   father a child while taking this medicine and for at least 3 months after stopping it. There is a potential for serious side effects to an unborn child. Talk to your healthcare provider or pharmacist for more information. Do not breast-feed an infant while taking this medicine or for at least 1 week after stopping it. This medicine may make it  more difficult to father a child. You should talk with your doctor or healthcare provider if you are concerned about your fertility. What side effects may I notice from receiving this medicine? Side effects that you should report to your doctor or health care professional as soon as possible:  allergic reactions like skin rash, itching or hives, swelling of the face, lips, or tongue  low blood counts - this medicine may decrease the number of white blood cells, red blood cells and platelets. You may be at increased risk for infections and bleeding.  rash, fever, and swollen lymph nodes  redness, blistering, peeling, or loosening of the skin, including inside the mouth  signs of infection like fever or chills, cough, sore throat, pain or difficulty passing urine  signs of decreased platelets or bleeding like bruising, pinpoint red spots on the skin, black, tarry stools, blood in the urine  signs of decreased red blood cells like being unusually weak or tired, fainting spells, lightheadedness  signs and symptoms of kidney injury like trouble passing urine or change in the amount of urine  signs and symptoms of liver injury like dark yellow or brown urine; general ill feeling or flu-like symptoms; light-colored stools; loss of appetite; nausea; right upper belly pain; unusually weak or tired; yellowing of the eyes or skin Side effects that usually do not require medical attention (report to your doctor or health care professional if they continue or are bothersome):  constipation  decreased appetite  diarrhea  headache  mouth sores  nausea, vomiting  tiredness This list may not describe all possible side effects. Call your doctor for medical advice about side effects. You may report side effects to FDA at 1-800-FDA-1088. Where should I keep my medicine? This drug is given in a hospital or clinic and will not be stored at home. NOTE: This sheet is a summary. It may not cover all  possible information. If you have questions about this medicine, talk to your doctor, pharmacist, or health care provider.  2020 Elsevier/Gold Standard (2019-01-08 10:26:46)  

## 2020-05-15 ENCOUNTER — Inpatient Hospital Stay: Payer: Medicare Other

## 2020-05-15 VITALS — BP 118/44 | HR 79 | Temp 98.4°F | Resp 17

## 2020-05-15 DIAGNOSIS — C8308 Small cell B-cell lymphoma, lymph nodes of multiple sites: Secondary | ICD-10-CM

## 2020-05-15 DIAGNOSIS — Z5112 Encounter for antineoplastic immunotherapy: Secondary | ICD-10-CM | POA: Diagnosis not present

## 2020-05-15 LAB — BETA 2 MICROGLOBULIN, SERUM: Beta-2 Microglobulin: 2.3 mg/L (ref 0.6–2.4)

## 2020-05-15 MED ORDER — SODIUM CHLORIDE 0.9 % IV SOLN
90.0000 mg/m2 | Freq: Once | INTRAVENOUS | Status: AC
  Administered 2020-05-15: 175 mg via INTRAVENOUS
  Filled 2020-05-15: qty 7

## 2020-05-15 MED ORDER — SODIUM CHLORIDE 0.9 % IV SOLN
10.0000 mg | Freq: Once | INTRAVENOUS | Status: AC
  Administered 2020-05-15: 10 mg via INTRAVENOUS
  Filled 2020-05-15: qty 10

## 2020-05-15 MED ORDER — SODIUM CHLORIDE 0.9 % IV SOLN
Freq: Once | INTRAVENOUS | Status: AC
  Filled 2020-05-15: qty 250

## 2020-06-01 ENCOUNTER — Encounter: Payer: Self-pay | Admitting: Cardiology

## 2020-06-01 ENCOUNTER — Other Ambulatory Visit: Payer: Self-pay

## 2020-06-01 ENCOUNTER — Other Ambulatory Visit: Payer: Self-pay | Admitting: Cardiology

## 2020-06-01 ENCOUNTER — Ambulatory Visit: Payer: Medicare Other | Admitting: Cardiology

## 2020-06-01 VITALS — BP 166/76 | HR 81 | Ht 66.0 in | Wt 174.0 lb

## 2020-06-01 DIAGNOSIS — I10 Essential (primary) hypertension: Secondary | ICD-10-CM | POA: Diagnosis not present

## 2020-06-01 DIAGNOSIS — U071 COVID-19: Secondary | ICD-10-CM

## 2020-06-01 DIAGNOSIS — E118 Type 2 diabetes mellitus with unspecified complications: Secondary | ICD-10-CM | POA: Diagnosis not present

## 2020-06-01 DIAGNOSIS — E782 Mixed hyperlipidemia: Secondary | ICD-10-CM

## 2020-06-01 DIAGNOSIS — C8308 Small cell B-cell lymphoma, lymph nodes of multiple sites: Secondary | ICD-10-CM

## 2020-06-01 DIAGNOSIS — I2510 Atherosclerotic heart disease of native coronary artery without angina pectoris: Secondary | ICD-10-CM

## 2020-06-01 DIAGNOSIS — J1282 Pneumonia due to coronavirus disease 2019: Secondary | ICD-10-CM

## 2020-06-01 HISTORY — DX: Atherosclerotic heart disease of native coronary artery without angina pectoris: I25.10

## 2020-06-01 HISTORY — DX: Type 2 diabetes mellitus with unspecified complications: E11.8

## 2020-06-01 HISTORY — DX: Mixed hyperlipidemia: E78.2

## 2020-06-01 NOTE — Progress Notes (Signed)
Cardiology Office Note:    Date:  06/01/2020   ID:  Valerie Cline, DOB 11-16-1955, MRN [id number]  PCP:  Valerie Redwood, MD  Cardiologist:  Valerie Lindau, MD   Referring MD: Valerie Redwood, MD    ASSESSMENT:    1. Essential hypertension   2. Coronary artery calcification seen on CT scan   3. Type 2 diabetes mellitus with unspecified complications ([place])   4. Mixed dyslipidemia   5. 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 Coronary artery calcification: Secondary prevention stressed with the patient.  Importance of compliance with diet medication stressed he vocalized understanding.  Patient does take aspirin on a daily basis.  She is asymptomatic but leads a sedentary lifestyle and therefore she will undergo Lexiscan sestamibi to assess for objective evidence of coronary artery disease. 2. Essential hypertension: Blood pressure is elevated but home blood pressures are fine.  She has an element of whitecoat hypertension. 3. Mixed dyslipidemia: On statin therapy and managed by primary care physician. 4. Diabetes mellitus: Diet was emphasized 5. Cardiac murmur: Echocardiogram will be done to assess murmur on auscultation. 6. Patient will be seen in follow-up appointment in 3 months or earlier if the patient has any concerns.    Medication Adjustments/Labs and Tests Ordered: Current medicines are reviewed at length with the patient today.  Concerns regarding medicines are outlined above.  No orders of the defined types were placed in this encounter.  No orders of the defined types were placed in this encounter.    History of Present Illness:    Valerie Cline is a 64 y.o. female who is being seen today for the evaluation of coronary artery calcification on CT scan at the request of Valerie Redwood, MD.  Patient is a pleasant 64 year old female.  She has past medical history of essential hypertension  dyslipidemia diabetes mellitus and B-cell lymphoma.  She underwent CT scanning of the chest with extensive calcification.  She denies any chest pain orthopnea or PND.  She leads a sedentary lifestyle.  She does not exercise on a regular basis.  At the time of my evaluation, the patient is alert awake oriented and in no distress.  Past Medical History:  Diagnosis Date  . Allergy   . Blood in stool   . Carotid stenosis   . Diabetes mellitus without complication ([place])   . Elevated liver function tests   . External hemorrhoids without mention of complication   . Goals of care, counseling/discussion 10/28/2019  . Hyperlipidemia   . Hypertension   . Lymphadenopathy, abdominal 10/28/2019  . 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 BREAST BIOPSY Right    needle biopsy  . COLONOSCOPY    . POLYPECTOMY    . Ruptured disc      Current Medications: Current Meds  Medication Sig  . allopurinol (ZYLOPRIM) 100 MG tablet TAKE 1 TABLET(100 MG) BY MOUTH DAILY  . amLODipine (NORVASC) 2.5 MG tablet Take 2.5 mg by mouth at bedtime.  Marland Kitchen aspirin EC 81 MG tablet Take 81 mg by mouth at bedtime.  . benazepril (LOTENSIN) 20 MG tablet Take 20 mg by mouth at bedtime.   . Dulaglutide (TRULICITY) 1.5 LP/3.7TK SOPN Inject 1.5 mg into the skin every Tuesday.   . famciclovir (FAMVIR) 500 MG tablet Take  1 tablet (500 mg total) by mouth daily.  . hydrochlorothiazide 25 MG tablet Take 25 mg by mouth at bedtime.   . insulin regular human CONCENTRATED (HUMULIN R) 500 UNIT/ML SOLN injection Inject 50-70 Units into the skin See admin instructions. Inject 70 units subcutaneously before breakfast, and 50 units before lunch and supper - drawn on a U-500 syringe  . LORazepam (ATIVAN) 0.5 MG tablet Take 1 tablet (0.5 mg total) by mouth every 6 (six) hours as needed (Nausea or vomiting).  . ondansetron (ZOFRAN) 8 MG tablet Take 1 tablet (8  mg total) by mouth 2 (two) times daily as needed for refractory nausea / vomiting. Start on day 2 after bendamustine chemo.  . prochlorperazine (COMPAZINE) 10 MG tablet Take 1 tablet (10 mg total) by mouth every 6 (six) hours as needed (Nausea or vomiting).  . rosuvastatin (CRESTOR) 10 MG tablet Take 10 mg by mouth at bedtime.   Current Facility-Administered Medications for the 06/01/20 encounter (Office Visit) with Valerie Cline, Reita Cliche, MD  Medication  . 0.9 %  sodium chloride infusion     Allergies:   Patient has no known allergies.   Social History   Socioeconomic History  . Marital status: Married    Spouse name: Not on file  . Number of children: 1  . Years of education: Not on file  . Highest education level: Not on file  Occupational History  . Occupation: Engineer, maintenance (IT)  Tobacco Use  . Smoking status: Never Smoker  . Smokeless tobacco: Never Used  Vaping Use  . Vaping Use: Never used  Substance and Sexual Activity  . Alcohol use: No  . Drug use: No  . Sexual activity: Not on file  Other Topics Concern  . Not on file  Social History Narrative  . Not on file   Social Determinants of Health   Financial Resource Strain:   . Difficulty of Paying Living Expenses:   Food Insecurity:   . Worried About Charity fundraiser in the Last Year:   . Arboriculturist in the Last Year:   Transportation Needs:   . Film/video editor (Medical):   Marland Kitchen Lack of Transportation (Non-Medical):   Physical Activity:   . Days of Exercise per Week:   . Minutes of Exercise per Session:   Stress:   . Feeling of Stress :   Social Connections:   . Frequency of Communication with Friends and Family:   . Frequency of Social Gatherings with Friends and Family:   . Attends Religious Services:   . Active Member of Clubs or Organizations:   . Attends Archivist Meetings:   Marland Kitchen Marital Status:      Family History: The patient's family history is negative for Colon cancer,  Esophageal cancer, Rectal cancer, Stomach cancer, and Pancreatic cancer.  ROS:   Please see the history of present illness.    All other systems reviewed and are negative.  EKGs/Labs/Other Studies Reviewed:    The following studies were reviewed today: EKG reveals sinus rhythm and nonspecific ST-T changes.   Recent Labs: 11/08/2019: TSH 0.666 11/11/2019: Magnesium 1.9 05/14/2020: ALT 35; BUN 12; Creatinine 0.61; Hemoglobin 13.4; Platelet Count 175; Potassium 3.4; Sodium 146  Recent Lipid Panel    Component Value Date/Time   TRIG 222 (H) 11/08/2019 1804    Physical Exam:    VS:  BP (!) 166/76   Pulse 81   Ht 5\' 6"  (1.676 m)   Wt 174 lb (  78.9 kg)   SpO2 97%   BMI 28.08 kg/m     Wt Readings from Last 3 Encounters:  06/01/20 174 lb (78.9 kg)  05/14/20 173 lb (78.5 kg)  04/09/20 173 lb 8 oz (78.7 kg)     GEN: Patient is in no acute distress HEENT: Normal NECK: No JVD; No carotid bruits LYMPHATICS: No lymphadenopathy CARDIAC: S1 S2 regular, 2/6 systolic murmur at the apex. RESPIRATORY:  Clear to auscultation without rales, wheezing or rhonchi  ABDOMEN: Soft, non-tender, non-distended MUSCULOSKELETAL:  No edema; No deformity  SKIN: Warm and dry NEUROLOGIC:  Alert and oriented x 3 PSYCHIATRIC:  Normal affect    Signed, Valerie Lindau, MD  06/01/2020 3:12 PM    [facility]

## 2020-06-01 NOTE — Patient Instructions (Signed)
Medication Instructions:  No medication changes. *If you need a refill on your cardiac medications before your next appointment, please call your pharmacy*   Lab Work: None ordered If you have labs (blood work) drawn today and your tests are completely normal, you will receive your results only by: Marland Kitchen MyChart Message (if you have MyChart) OR . A paper copy in the mail If you have any lab test that is abnormal or we need to change your treatment, we will call you to review the results.   Testing/Procedures: Your physician has requested that you have an echocardiogram. Echocardiography is a painless test that uses sound waves to create images of your heart. It provides your doctor with information about the size and shape of your heart and how well your heart's chambers and valves are working. This procedure takes approximately one hour. There are no restrictions for this procedure.  Your physician has requested that you have a lexiscan myoview. For further information please visit [email or web address]. Please follow instruction sheet, as given.  The test will take approximately 3 to 4 hours to complete; you may bring reading material.  If someone comes with you to your appointment, they will need to remain in the main lobby due to limited space in the testing area.   How to prepare for your Myocardial Perfusion Test: . Do not eat or drink 3 hours prior to your test, except you may have water. . Do not consume products containing caffeine (regular or decaffeinated) 12 hours prior to your test. (ex: coffee, chocolate, sodas, tea). . Do bring a list of your current medications with you.  If not listed below, you may take your medications as normal. . Do wear comfortable clothes (no dresses or overalls) and walking shoes, tennis shoes preferred (No heels or open toe shoes are allowed). . Do NOT wear cologne, perfume, aftershave, or lotions (deodorant is allowed). . If these instructions are not  followed, your test will have to be rescheduled.    Follow-Up: At [facility], you and your health needs are our priority.  As part of our continuing mission to provide you with exceptional heart care, we have created designated Provider Care Teams.  These Care Teams include your primary Cardiologist (physician) and Advanced Practice Providers (APPs -  Physician Assistants and Nurse Practitioners) who all work together to provide you with the care you need, when you need it.  We recommend signing up for the patient portal called "MyChart".  Sign up information is provided on this After Visit Summary.  MyChart is used to connect with patients for Virtual Visits (Telemedicine).  Patients are able to view lab/test results, encounter notes, upcoming appointments, etc.  Non-urgent messages can be sent to your provider as well.   To learn more about what you can do with MyChart, go to [email or web address].    Your next appointment:   3 month(s)  The format for your next appointment:   In Person  Provider:   Jyl Heinz, MD   Other Instructions:  Nuclear Medicine Exam A nuclear medicine exam is a safe and painless imaging test. It helps your health care provider detect and diagnose diseases. It also provides information about the ways your organs work and how they are structured. For a nuclear medicine exam, you will be given a radioactive tracer. This substance is absorbed by your body's organs. A large scanning machine detects the tracer and creates pictures of the areas that your health care provider wants to know more  about. There are several kinds of nuclear medicine exams. They include the following:  CT scan.  MRI scan.  PET scan.  SPECT scan. Tell your health care provider about:  Any allergies you have.  All medicines you are taking, including vitamins, herbs, eye drops, creams, and over-the-counter medicines.  Any problems you or family members have had with  anesthetic medicines.  Any blood disorders you have.  Any surgeries you have had.  Any medical conditions you have.  Whether you are pregnant or may be pregnant.  Whether you are nursing. What are the risks? Generally, this is a safe procedure. However, problems may occur, such as an allergic reaction to the tracer, but this is rare. What happens before the procedure? Medicines Ask your health care provider about:  Changing or stopping your regular medicines. This is especially important if you are taking diabetes medicines or blood thinners.  Taking medicines such as aspirin and ibuprofen. These medicines can thin your blood. Do not take these medicines unless your health care provider tells you to take them.  Taking over-the-counter medicines, vitamins, herbs, and supplements. General instructions  Follow instructions from your health care provider about eating or drinking restrictions.  Do not wear jewelry.  Wear loose, comfortable clothing. You may be asked to wear a hospital gown for the procedure.  Bring previous imaging studies, such as X-rays, with you to the exam if they are available. What happens during the procedure?   An IV may be inserted into one of your veins.  You will be asked to lie on a table or sit in a chair.  You will be given the radioactive tracer. You may get: ? A pill or liquid to swallow. ? An injection. ? Medicine through your IV. ? A gas to inhale.  A large scanning machine will be used to create images of your body. After the pictures are taken, you may have to wait so your health care provider can make sure that enough images were taken. The procedure may vary among health care providers and hospitals. What happens after the procedure?  You may go home after the procedure and return to your usual activities, unless your health care provider tells you otherwise.  Drink enough water to keep your urine pale yellow. This helps to remove  the radioactive tracer from your body.  It is up to you to get the results of your procedure. Ask your health care provider, or the department that is doing the procedure, when your results will be ready.  Get help right away if you have problems breathing. Summary  A nuclear medicine exam is a safe and painless imaging test that provides information about how your organs are working. It is also used to detect and diagnose diseases of various body organs.  Follow your health care provider's instructions about eating and drinking restrictions. Ask whether you should change or stop any medicines.  During the procedure, you will be given a radioactive tracer. A large scanning machine will create images of your body.  You may go home after the procedure and return to your regular activities. Follow your health care provider's instructions.  Get help right away if you have problems breathing. This information is not intended to replace advice given to you by your health care provider. Make sure you discuss any questions you have with your health care provider. Document Revised: 09/05/2018 Document Reviewed: 09/05/2018 Elsevier Patient Education  [employer].  Echocardiogram An echocardiogram is a  procedure that uses painless sound waves (ultrasound) to produce an image of the heart. Images from an echocardiogram can provide important information about:  Signs of coronary artery disease (CAD).  Aneurysm detection. An aneurysm is a weak or damaged part of an artery wall that bulges out from the normal force of blood pumping through the body.  Heart size and shape. Changes in the size or shape of the heart can be associated with certain conditions, including heart failure, aneurysm, and CAD.  Heart muscle function.  Heart valve function.  Signs of a past heart attack.  Fluid buildup around the heart.  Thickening of the heart muscle.  A tumor or infectious growth around the heart  valves. Tell a health care provider about:  Any allergies you have.  All medicines you are taking, including vitamins, herbs, eye drops, creams, and over-the-counter medicines.  Any blood disorders you have.  Any surgeries you have had.  Any medical conditions you have.  Whether you are pregnant or may be pregnant. What are the risks? Generally, this is a safe procedure. However, problems may occur, including:  Allergic reaction to dye (contrast) that may be used during the procedure. What happens before the procedure? No specific preparation is needed. You may eat and drink normally. What happens during the procedure?   An IV tube may be inserted into one of your veins.  You may receive contrast through this tube. A contrast is an injection that improves the quality of the pictures from your heart.  A gel will be applied to your chest.  A wand-like tool (transducer) will be moved over your chest. The gel will help to transmit the sound waves from the transducer.  The sound waves will harmlessly bounce off of your heart to allow the heart images to be captured in real-time motion. The images will be recorded on a computer. The procedure may vary among health care providers and hospitals. What happens after the procedure?  You may return to your normal, everyday life, including diet, activities, and medicines, unless your health care provider tells you not to do that. Summary  An echocardiogram is a procedure that uses painless sound waves (ultrasound) to produce an image of the heart.  Images from an echocardiogram can provide important information about the size and shape of your heart, heart muscle function, heart valve function, and fluid buildup around your heart.  You do not need to do anything to prepare before this procedure. You may eat and drink normally.  After the echocardiogram is completed, you may return to your normal, everyday life, unless your health care  provider tells you not to do that. This information is not intended to replace advice given to you by your health care provider. Make sure you discuss any questions you have with your health care provider. Document Revised: 02/07/2019 Document Reviewed: 11/19/2016 Elsevier Patient Education  [employer].

## 2020-06-02 ENCOUNTER — Telehealth (HOSPITAL_COMMUNITY): Payer: Self-pay | Admitting: *Deleted

## 2020-06-02 ENCOUNTER — Encounter (HOSPITAL_COMMUNITY): Payer: Self-pay | Admitting: *Deleted

## 2020-06-02 NOTE — Telephone Encounter (Signed)
Attempted to call patient regarding upcoming appointment- no answer, unable to leave a message.  Instruction letter sent via my chart.  Valerie Cline

## 2020-06-04 ENCOUNTER — Ambulatory Visit: Payer: Medicare Other

## 2020-06-04 ENCOUNTER — Other Ambulatory Visit: Payer: Medicare Other

## 2020-06-04 ENCOUNTER — Ambulatory Visit: Payer: Medicare Other | Admitting: Hematology & Oncology

## 2020-06-05 ENCOUNTER — Ambulatory Visit: Payer: Medicare Other

## 2020-06-09 ENCOUNTER — Ambulatory Visit (INDEPENDENT_AMBULATORY_CARE_PROVIDER_SITE_OTHER): Payer: Medicare Other

## 2020-06-09 ENCOUNTER — Other Ambulatory Visit: Payer: Self-pay

## 2020-06-09 DIAGNOSIS — I2510 Atherosclerotic heart disease of native coronary artery without angina pectoris: Secondary | ICD-10-CM

## 2020-06-09 LAB — MYOCARDIAL PERFUSION IMAGING
LV dias vol: 61 mL (ref 46–106)
LV sys vol: 17 mL
Peak HR: 107 {beats}/min
Rest HR: 79 {beats}/min
SDS: 3
SRS: 0
SSS: 3
TID: 0.92

## 2020-06-09 MED ORDER — REGADENOSON 0.4 MG/5ML IV SOLN
0.4000 mg | Freq: Once | INTRAVENOUS | Status: AC
  Administered 2020-06-09: 0.4 mg via INTRAVENOUS

## 2020-06-09 MED ORDER — TECHNETIUM TC 99M TETROFOSMIN IV KIT
28.9000 | PACK | Freq: Once | INTRAVENOUS | Status: AC | PRN
  Administered 2020-06-09: 28.9 via INTRAVENOUS

## 2020-06-09 MED ORDER — TECHNETIUM TC 99M TETROFOSMIN IV KIT
10.8000 | PACK | Freq: Once | INTRAVENOUS | Status: AC | PRN
  Administered 2020-06-09: 10.8 via INTRAVENOUS

## 2020-06-11 ENCOUNTER — Other Ambulatory Visit: Payer: Self-pay

## 2020-06-11 ENCOUNTER — Encounter: Payer: Self-pay | Admitting: Hematology & Oncology

## 2020-06-11 ENCOUNTER — Inpatient Hospital Stay: Payer: Medicare Other | Attending: Hematology & Oncology

## 2020-06-11 ENCOUNTER — Inpatient Hospital Stay (HOSPITAL_BASED_OUTPATIENT_CLINIC_OR_DEPARTMENT_OTHER): Payer: Medicare Other | Admitting: Hematology & Oncology

## 2020-06-11 ENCOUNTER — Inpatient Hospital Stay: Payer: Medicare Other

## 2020-06-11 ENCOUNTER — Encounter: Payer: Self-pay | Admitting: *Deleted

## 2020-06-11 VITALS — BP 123/50 | HR 72 | Temp 97.8°F | Resp 17

## 2020-06-11 VITALS — BP 138/53 | HR 78 | Temp 98.4°F | Resp 16 | Wt 173.0 lb

## 2020-06-11 DIAGNOSIS — C8308 Small cell B-cell lymphoma, lymph nodes of multiple sites: Secondary | ICD-10-CM | POA: Diagnosis not present

## 2020-06-11 DIAGNOSIS — Z79899 Other long term (current) drug therapy: Secondary | ICD-10-CM | POA: Insufficient documentation

## 2020-06-11 DIAGNOSIS — Z5112 Encounter for antineoplastic immunotherapy: Secondary | ICD-10-CM | POA: Diagnosis present

## 2020-06-11 DIAGNOSIS — Z5111 Encounter for antineoplastic chemotherapy: Secondary | ICD-10-CM | POA: Diagnosis present

## 2020-06-11 LAB — CBC WITH DIFFERENTIAL (CANCER CENTER ONLY)
Abs Immature Granulocytes: 0.04 10*3/uL (ref 0.00–0.07)
Basophils Absolute: 0 10*3/uL (ref 0.0–0.1)
Basophils Relative: 1 %
Eosinophils Absolute: 0.6 10*3/uL — ABNORMAL HIGH (ref 0.0–0.5)
Eosinophils Relative: 12 %
HCT: 38.1 % (ref 36.0–46.0)
Hemoglobin: 13 g/dL (ref 12.0–15.0)
Immature Granulocytes: 1 %
Lymphocytes Relative: 15 %
Lymphs Abs: 0.8 10*3/uL (ref 0.7–4.0)
MCH: 26.4 pg (ref 26.0–34.0)
MCHC: 34.1 g/dL (ref 30.0–36.0)
MCV: 77.4 fL — ABNORMAL LOW (ref 80.0–100.0)
Monocytes Absolute: 1.1 10*3/uL — ABNORMAL HIGH (ref 0.1–1.0)
Monocytes Relative: 20 %
Neutro Abs: 2.9 10*3/uL (ref 1.7–7.7)
Neutrophils Relative %: 51 %
Platelet Count: 145 10*3/uL — ABNORMAL LOW (ref 150–400)
RBC: 4.92 MIL/uL (ref 3.87–5.11)
RDW: 14.5 % (ref 11.5–15.5)
WBC Count: 5.6 10*3/uL (ref 4.0–10.5)
nRBC: 0 % (ref 0.0–0.2)

## 2020-06-11 LAB — CMP (CANCER CENTER ONLY)
ALT: 42 U/L (ref 0–44)
AST: 35 U/L (ref 15–41)
Albumin: 4.1 g/dL (ref 3.5–5.0)
Alkaline Phosphatase: 61 U/L (ref 38–126)
Anion gap: 9 (ref 5–15)
BUN: 13 mg/dL (ref 8–23)
CO2: 32 mmol/L (ref 22–32)
Calcium: 9.5 mg/dL (ref 8.9–10.3)
Chloride: 100 mmol/L (ref 98–111)
Creatinine: 0.7 mg/dL (ref 0.44–1.00)
GFR, Est AFR Am: >60 mL/min (ref >60)
GFR, Estimated: >60 mL/min (ref >60)
Glucose, Bld: 222 mg/dL — ABNORMAL HIGH (ref 70–99)
Potassium: 3.2 mmol/L — ABNORMAL LOW (ref 3.5–5.1)
Sodium: 141 mmol/L (ref 135–145)
Total Bilirubin: 0.8 mg/dL (ref 0.3–1.2)
Total Protein: 6.2 g/dL — ABNORMAL LOW (ref 6.5–8.1)

## 2020-06-11 MED ORDER — DIPHENHYDRAMINE HCL 25 MG PO CAPS
50.0000 mg | ORAL_CAPSULE | Freq: Once | ORAL | Status: AC
  Administered 2020-06-11: 50 mg via ORAL

## 2020-06-11 MED ORDER — SODIUM CHLORIDE 0.9 % IV SOLN
10.0000 mg | Freq: Once | INTRAVENOUS | Status: AC
  Administered 2020-06-11: 10 mg via INTRAVENOUS
  Filled 2020-06-11: qty 10

## 2020-06-11 MED ORDER — ACETAMINOPHEN 325 MG PO TABS
650.0000 mg | ORAL_TABLET | Freq: Once | ORAL | Status: AC
  Administered 2020-06-11: 650 mg via ORAL

## 2020-06-11 MED ORDER — ACETAMINOPHEN 325 MG PO TABS
ORAL_TABLET | ORAL | Status: AC
  Filled 2020-06-11: qty 2

## 2020-06-11 MED ORDER — SODIUM CHLORIDE 0.9 % IV SOLN
Freq: Once | INTRAVENOUS | Status: AC
  Filled 2020-06-11: qty 250

## 2020-06-11 MED ORDER — PALONOSETRON HCL INJECTION 0.25 MG/5ML
0.2500 mg | Freq: Once | INTRAVENOUS | Status: AC
  Administered 2020-06-11: 0.25 mg via INTRAVENOUS

## 2020-06-11 MED ORDER — DIPHENHYDRAMINE HCL 25 MG PO CAPS
ORAL_CAPSULE | ORAL | Status: AC
  Filled 2020-06-11: qty 2

## 2020-06-11 MED ORDER — SODIUM CHLORIDE 0.9 % IV SOLN
375.0000 mg/m2 | Freq: Once | INTRAVENOUS | Status: AC
  Administered 2020-06-11: 700 mg via INTRAVENOUS
  Filled 2020-06-11: qty 50

## 2020-06-11 MED ORDER — SODIUM CHLORIDE 0.9 % IV SOLN
90.0000 mg/m2 | Freq: Once | INTRAVENOUS | Status: AC
  Administered 2020-06-11: 175 mg via INTRAVENOUS
  Filled 2020-06-11: qty 7

## 2020-06-11 MED ORDER — PALONOSETRON HCL INJECTION 0.25 MG/5ML
INTRAVENOUS | Status: AC
  Filled 2020-06-11: qty 5

## 2020-06-11 NOTE — Progress Notes (Signed)
Hematology and Oncology Follow Up Visit  Valerie Cline [id number] [birth year] 64 y.o. 06/11/2020   Principle Diagnosis:  Small lymphocytic B cell non-Hodgkin lymphoma -- progressive  Current Therapy:        Rituxan/Bendamustine -- s/p cycle #4 - started on 02/13/2020   Interim History:  Valerie Cline is here today for follow-up and treatment.  She looks fantastic.  She is doing quite well.  She really has tolerated treatment beautifully.  She has had no problems with treatment.  She has responded as expected.  She has had no problems with fever.  There is no cough.  There is no nausea or vomiting.  She has had no change in bowel or bladder habits.  She has had no headache.  There is no rashes.  Overall, her performance status is ECOG 0.    Medications:  Allergies as of 06/11/2020   No Known Allergies     Medication List       Accurate as of June 11, 2020  8:09 AM. If you have any questions, ask your nurse or doctor.        allopurinol 100 MG tablet Commonly known as: ZYLOPRIM TAKE 1 TABLET(100 MG) BY MOUTH DAILY   amLODipine 2.5 MG tablet Commonly known as: NORVASC Take 2.5 mg by mouth at bedtime.   aspirin EC 81 MG tablet Take 81 mg by mouth at bedtime.   benazepril 20 MG tablet Commonly known as: LOTENSIN Take 20 mg by mouth at bedtime.   famciclovir 500 MG tablet Commonly known as: FAMVIR Take 1 tablet (500 mg total) by mouth daily.   HUMULIN R 500 UNIT/ML injection Generic drug: insulin regular human CONCENTRATED Inject 50-70 Units into the skin See admin instructions. Inject 70 units subcutaneously before breakfast, and 50 units before lunch and supper - drawn on a U-500 syringe   hydrochlorothiazide 25 MG tablet Commonly known as: HYDRODIURIL Take 25 mg by mouth at bedtime.   LORazepam 0.5 MG tablet Commonly known as: Ativan Take 1 tablet (0.5 mg total) by mouth every 6 (six) hours as needed (Nausea or vomiting).   ondansetron 8 MG tablet Commonly  known as: Zofran Take 1 tablet (8 mg total) by mouth 2 (two) times daily as needed for refractory nausea / vomiting. Start on day 2 after bendamustine chemo.   prochlorperazine 10 MG tablet Commonly known as: COMPAZINE Take 1 tablet (10 mg total) by mouth every 6 (six) hours as needed (Nausea or vomiting).   rosuvastatin 10 MG tablet Commonly known as: CRESTOR Take 10 mg by mouth at bedtime.   Trulicity 1.5 QI/3.4VQ Sopn Generic drug: Dulaglutide Inject 1.5 mg into the skin every Tuesday.       Allergies: No Known Allergies  Past Medical History, Surgical history, Social history, and Family History were reviewed and updated.  Review of Systems: Review of Systems  Constitutional: Negative.   HENT: Negative.   Eyes: Negative.   Respiratory: Negative.   Cardiovascular: Negative.   Gastrointestinal: Negative.   Genitourinary: Negative.   Musculoskeletal: Negative.   Skin: Negative.   Neurological: Negative.   Endo/Heme/Allergies: Negative.   Psychiatric/Behavioral: Negative.       Physical Exam:  vitals were not taken for this visit.   Wt Readings from Last 3 Encounters:  06/09/20 174 lb (78.9 kg)  06/01/20 174 lb (78.9 kg)  05/14/20 173 lb (78.5 kg)    Vital signs show temperature of 98.4.  Pulse 78.  Blood pressure 138/53.  Weight is 173  pounds.  Physical Exam Vitals reviewed.  HENT:     Head: Normocephalic and atraumatic.  Eyes:     Pupils: Pupils are equal, round, and reactive to light.  Cardiovascular:     Rate and Rhythm: Normal rate and regular rhythm.     Heart sounds: Normal heart sounds.  Pulmonary:     Effort: Pulmonary effort is normal.     Breath sounds: Normal breath sounds.  Abdominal:     General: Bowel sounds are normal.     Palpations: Abdomen is soft.  Musculoskeletal:        General: No tenderness or deformity. Normal range of motion.     Cervical back: Normal range of motion.  Lymphadenopathy:     Cervical: No cervical adenopathy.    Skin:    General: Skin is warm and dry.     Findings: No erythema or rash.  Neurological:     Mental Status: She is alert and oriented to person, place, and time.  Psychiatric:        Behavior: Behavior normal.        Thought Content: Thought content normal.        Judgment: Judgment normal.      Lab Results  Component Value Date   WBC 5.6 06/11/2020   HGB 13.0 06/11/2020   HCT 38.1 06/11/2020   MCV 77.4 (L) 06/11/2020   PLT 145 (L) 06/11/2020   Lab Results  Component Value Date   FERRITIN 305 11/11/2019   Lab Results  Component Value Date   RBC 4.92 06/11/2020   Lab Results  Component Value Date   KPAFRELGTCHN 131.9 (H) 12/30/2019   LAMBDASER 29.8 (H) 12/30/2019   KAPLAMBRATIO 4.43 (H) 12/30/2019   Lab Results  Component Value Date   IGGSERUM 694 04/09/2020   IGA 109 04/09/2020   IGMSERUM 199 04/09/2020   Lab Results  Component Value Date   TOTALPROTELP 6.6 12/30/2019   ALBUMINELP 3.8 12/30/2019   A1GS 0.2 12/30/2019   A2GS 0.7 12/30/2019   BETS 0.9 12/30/2019   GAMS 1.0 12/30/2019   MSPIKE Not Observed 12/30/2019     Chemistry      Component Value Date/Time   NA 146 (H) 05/14/2020 0748   K 3.4 (L) 05/14/2020 0748   CL 96 (L) 05/14/2020 0748   CO2 33 (H) 05/14/2020 0748   BUN 12 05/14/2020 0748   CREATININE 0.61 05/14/2020 0748      Component Value Date/Time   CALCIUM 9.6 05/14/2020 0748   ALKPHOS 62 05/14/2020 0748   AST 30 05/14/2020 0748   ALT 35 05/14/2020 0748   BILITOT 0.9 05/14/2020 0748       Impression and Plan: Valerie Cline is a very pleasant 65 yo caucasian female with small lymphocytic B cell non-Hodgkin's lymphoma.  She is tolerating treatment nicely so far and will proceed with cycle 5/6 today as planned.   After her sixth cycle, we will repeat her scans.  Then we will get her on maintenance Rituxan.  I am just happy that she has done well.  I am happy that her quality of life is still doing nicely.    Volanda Napoleon,  MD 8/12/20218:09 AM

## 2020-06-11 NOTE — Progress Notes (Signed)
Patient doing well with no complaints or issues surrounding treatment. She has one more treatment after this cycle and will transition to maintenance.   Oncology Nurse Navigator Documentation  Oncology Nurse Navigator Flowsheets 06/11/2020  Abnormal Finding Date -  Confirmed Diagnosis Date -  Diagnosis Status -  Planned Course of Treatment -  Phase of Treatment -  Chemotherapy Pending- Reason: -  Chemotherapy Actual Start Date: -  Navigator Follow Up Date: 07/09/2020  Navigator Follow Up Reason: Follow-up Appointment;Chemotherapy  Navigator Location [facility]  Referral Date to RadOnc/MedOnc -  Navigator Encounter Type Treatment  Telephone -  Treatment Initiated Date -  Patient Visit Type MedOnc  Treatment Phase Active Tx  Barriers/Navigation Needs No Barriers At This Time  Education -  Interventions Psycho-Social Support  Acuity Level 1-No Barriers  Referrals -  Coordination of Care -  Education Method -  Support Groups/Services Friends and Family  Time Spent with Patient 30

## 2020-06-12 ENCOUNTER — Inpatient Hospital Stay: Payer: Medicare Other

## 2020-06-12 VITALS — BP 123/50 | HR 73 | Temp 98.8°F | Resp 18

## 2020-06-12 DIAGNOSIS — C8308 Small cell B-cell lymphoma, lymph nodes of multiple sites: Secondary | ICD-10-CM

## 2020-06-12 DIAGNOSIS — Z5112 Encounter for antineoplastic immunotherapy: Secondary | ICD-10-CM | POA: Diagnosis not present

## 2020-06-12 MED ORDER — SODIUM CHLORIDE 0.9 % IV SOLN
90.0000 mg/m2 | Freq: Once | INTRAVENOUS | Status: AC
  Administered 2020-06-12: 175 mg via INTRAVENOUS
  Filled 2020-06-12: qty 7

## 2020-06-12 MED ORDER — SODIUM CHLORIDE 0.9 % IV SOLN
10.0000 mg | Freq: Once | INTRAVENOUS | Status: AC
  Administered 2020-06-12: 10 mg via INTRAVENOUS
  Filled 2020-06-12: qty 10

## 2020-06-12 MED ORDER — SODIUM CHLORIDE 0.9 % IV SOLN
Freq: Once | INTRAVENOUS | Status: AC
  Filled 2020-06-12: qty 250

## 2020-06-12 NOTE — Progress Notes (Signed)
Discharged pt for previous RN.

## 2020-06-18 ENCOUNTER — Ambulatory Visit (INDEPENDENT_AMBULATORY_CARE_PROVIDER_SITE_OTHER): Payer: Medicare Other

## 2020-06-18 ENCOUNTER — Other Ambulatory Visit: Payer: Self-pay

## 2020-06-18 DIAGNOSIS — I2510 Atherosclerotic heart disease of native coronary artery without angina pectoris: Secondary | ICD-10-CM | POA: Diagnosis not present

## 2020-06-18 NOTE — Progress Notes (Signed)
Complete echocardiogram performed.  Jimmy Shawnee Higham RDCS, RVT  

## 2020-06-19 LAB — ECHOCARDIOGRAM COMPLETE
AR max vel: 1.69 cm2
AV Area VTI: 1.59 cm2
AV Area mean vel: 1.72 cm2
AV Mean grad: 9.5 mmHg
AV Peak grad: 17.6 mmHg
Ao pk vel: 2.1 m/s
Area-P 1/2: 2.69 cm2
S' Lateral: 2.8 cm

## 2020-07-02 ENCOUNTER — Other Ambulatory Visit: Payer: Medicare Other

## 2020-07-02 ENCOUNTER — Ambulatory Visit: Payer: Medicare Other | Admitting: Hematology & Oncology

## 2020-07-02 ENCOUNTER — Ambulatory Visit: Payer: Medicare Other

## 2020-07-03 ENCOUNTER — Ambulatory Visit: Payer: Medicare Other

## 2020-07-09 ENCOUNTER — Telehealth: Payer: Self-pay | Admitting: Hematology & Oncology

## 2020-07-09 ENCOUNTER — Inpatient Hospital Stay (HOSPITAL_BASED_OUTPATIENT_CLINIC_OR_DEPARTMENT_OTHER): Payer: Medicare Other | Admitting: Hematology & Oncology

## 2020-07-09 ENCOUNTER — Inpatient Hospital Stay: Payer: Medicare Other

## 2020-07-09 ENCOUNTER — Inpatient Hospital Stay: Payer: Medicare Other | Attending: Hematology & Oncology

## 2020-07-09 ENCOUNTER — Other Ambulatory Visit: Payer: Self-pay

## 2020-07-09 ENCOUNTER — Encounter: Payer: Self-pay | Admitting: *Deleted

## 2020-07-09 ENCOUNTER — Encounter: Payer: Self-pay | Admitting: Hematology & Oncology

## 2020-07-09 VITALS — BP 120/48 | HR 78 | Resp 17

## 2020-07-09 VITALS — BP 143/53 | HR 81 | Temp 98.3°F | Resp 18 | Ht 66.0 in | Wt 173.0 lb

## 2020-07-09 DIAGNOSIS — C8308 Small cell B-cell lymphoma, lymph nodes of multiple sites: Secondary | ICD-10-CM | POA: Diagnosis not present

## 2020-07-09 DIAGNOSIS — Z79899 Other long term (current) drug therapy: Secondary | ICD-10-CM | POA: Diagnosis not present

## 2020-07-09 DIAGNOSIS — Z5111 Encounter for antineoplastic chemotherapy: Secondary | ICD-10-CM | POA: Diagnosis present

## 2020-07-09 DIAGNOSIS — Z5112 Encounter for antineoplastic immunotherapy: Secondary | ICD-10-CM | POA: Diagnosis not present

## 2020-07-09 LAB — LACTATE DEHYDROGENASE: LDH: 179 U/L (ref 98–192)

## 2020-07-09 LAB — CBC WITH DIFFERENTIAL (CANCER CENTER ONLY)
Abs Immature Granulocytes: 0.04 10*3/uL (ref 0.00–0.07)
Basophils Absolute: 0 10*3/uL (ref 0.0–0.1)
Basophils Relative: 1 %
Eosinophils Absolute: 0.5 10*3/uL (ref 0.0–0.5)
Eosinophils Relative: 13 %
HCT: 39.3 % (ref 36.0–46.0)
Hemoglobin: 13.4 g/dL (ref 12.0–15.0)
Immature Granulocytes: 1 %
Lymphocytes Relative: 47 %
Lymphs Abs: 2 10*3/uL (ref 0.7–4.0)
MCH: 26.4 pg (ref 26.0–34.0)
MCHC: 34.1 g/dL (ref 30.0–36.0)
MCV: 77.5 fL — ABNORMAL LOW (ref 80.0–100.0)
Monocytes Absolute: 1.2 10*3/uL — ABNORMAL HIGH (ref 0.1–1.0)
Monocytes Relative: 28 %
Neutro Abs: 0.4 10*3/uL — CL (ref 1.7–7.7)
Neutrophils Relative %: 10 %
Platelet Count: 169 10*3/uL (ref 150–400)
RBC: 5.07 MIL/uL (ref 3.87–5.11)
RDW: 14.2 % (ref 11.5–15.5)
WBC Count: 4.2 10*3/uL (ref 4.0–10.5)
nRBC: 0 % (ref 0.0–0.2)

## 2020-07-09 LAB — CMP (CANCER CENTER ONLY)
ALT: 29 U/L (ref 0–44)
AST: 29 U/L (ref 15–41)
Albumin: 4 g/dL (ref 3.5–5.0)
Alkaline Phosphatase: 61 U/L (ref 38–126)
Anion gap: 10 (ref 5–15)
BUN: 16 mg/dL (ref 8–23)
CO2: 31 mmol/L (ref 22–32)
Calcium: 9.6 mg/dL (ref 8.9–10.3)
Chloride: 97 mmol/L — ABNORMAL LOW (ref 98–111)
Creatinine: 0.71 mg/dL (ref 0.44–1.00)
GFR, Est AFR Am: >60 mL/min (ref >60)
GFR, Estimated: >60 mL/min (ref >60)
Glucose, Bld: 334 mg/dL — ABNORMAL HIGH (ref 70–99)
Potassium: 3.8 mmol/L (ref 3.5–5.1)
Sodium: 138 mmol/L (ref 135–145)
Total Bilirubin: 0.9 mg/dL (ref 0.3–1.2)
Total Protein: 6.5 g/dL (ref 6.5–8.1)

## 2020-07-09 MED ORDER — SODIUM CHLORIDE 0.9 % IV SOLN
375.0000 mg/m2 | Freq: Once | INTRAVENOUS | Status: AC
  Administered 2020-07-09: 700 mg via INTRAVENOUS
  Filled 2020-07-09: qty 50

## 2020-07-09 MED ORDER — DIPHENHYDRAMINE HCL 25 MG PO CAPS
ORAL_CAPSULE | ORAL | Status: AC
  Filled 2020-07-09: qty 2

## 2020-07-09 MED ORDER — PALONOSETRON HCL INJECTION 0.25 MG/5ML
0.2500 mg | Freq: Once | INTRAVENOUS | Status: AC
  Administered 2020-07-09: 0.25 mg via INTRAVENOUS

## 2020-07-09 MED ORDER — SODIUM CHLORIDE 0.9 % IV SOLN
90.0000 mg/m2 | Freq: Once | INTRAVENOUS | Status: AC
  Administered 2020-07-09: 175 mg via INTRAVENOUS
  Filled 2020-07-09: qty 7

## 2020-07-09 MED ORDER — ACETAMINOPHEN 325 MG PO TABS
650.0000 mg | ORAL_TABLET | Freq: Once | ORAL | Status: AC
  Administered 2020-07-09: 650 mg via ORAL

## 2020-07-09 MED ORDER — DIPHENHYDRAMINE HCL 25 MG PO CAPS
50.0000 mg | ORAL_CAPSULE | Freq: Once | ORAL | Status: AC
  Administered 2020-07-09: 50 mg via ORAL

## 2020-07-09 MED ORDER — ACETAMINOPHEN 325 MG PO TABS
ORAL_TABLET | ORAL | Status: AC
  Filled 2020-07-09: qty 2

## 2020-07-09 MED ORDER — PALONOSETRON HCL INJECTION 0.25 MG/5ML
INTRAVENOUS | Status: AC
  Filled 2020-07-09: qty 5

## 2020-07-09 MED ORDER — SODIUM CHLORIDE 0.9 % IV SOLN
Freq: Once | INTRAVENOUS | Status: AC
  Filled 2020-07-09: qty 250

## 2020-07-09 MED ORDER — SODIUM CHLORIDE 0.9 % IV SOLN
10.0000 mg | Freq: Once | INTRAVENOUS | Status: AC
  Administered 2020-07-09: 10 mg via INTRAVENOUS
  Filled 2020-07-09: qty 10

## 2020-07-09 NOTE — Telephone Encounter (Signed)
Appointments scheduled calendar printed per 9/9 los °

## 2020-07-09 NOTE — Progress Notes (Signed)
OK to treat with ANC-0.4 per Dr. Marin Olp.

## 2020-07-09 NOTE — Progress Notes (Signed)
Oncology Nurse Navigator Documentation  Oncology Nurse Navigator Flowsheets 07/09/2020  Abnormal Finding Date -  Confirmed Diagnosis Date -  Diagnosis Status -  Planned Course of Treatment -  Phase of Treatment Chemo  Chemotherapy Pending- Reason: -  Chemotherapy Actual Start Date: -  Chemotherapy Actual End Date: 07/10/2020  Navigator Follow Up Date: 09/03/2020  Navigator Follow Up Reason: Follow-up Appointment  Navigator Location [facility]  Referral Date to RadOnc/MedOnc -  Navigator Encounter Type Treatment  Telephone -  Treatment Initiated Date -  Patient Visit Type MedOnc  Treatment Phase Final Chemo TX;Active Tx  Barriers/Navigation Needs No Barriers At This Time  Education -  Interventions Psycho-Social Support  Acuity Level 1-No Barriers  Referrals -  Coordination of Care -  Education Method -  Support Groups/Services Friends and Family  Time Spent with Patient 15

## 2020-07-09 NOTE — Progress Notes (Signed)
Hematology and Oncology Follow Up Visit  Valerie Cline [id number] 07/19/1956 64 y.o. 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  She looks fantastic.  She is doing quite well.  She really has tolerated treatment beautifully.  She has had no problems with treatment.  Her blood sugars are on the high side today.  She has insulin with her.  She and her husband will have her 39th anniversary.  This is this weekend.  I am so happy for her.  She has responded as expected.  She has had no problems with fever.  There is no cough.  There is no nausea or vomiting.  She has had no change in bowel or bladder habits.  She has had no headache.  There is no rashes.  Overall, her performance status is ECOG 0.    Medications:  Allergies as of 07/09/2020   No Known Allergies     Medication List       Accurate as of July 09, 2020  8:15 AM. If you have any questions, ask your nurse or doctor.        allopurinol 100 MG tablet Commonly known as: ZYLOPRIM TAKE 1 TABLET(100 MG) BY MOUTH DAILY   amLODipine 2.5 MG tablet Commonly known as: NORVASC Take 2.5 mg by mouth at bedtime.   aspirin EC 81 MG tablet Take 81 mg by mouth at bedtime.   benazepril 20 MG tablet Commonly known as: LOTENSIN Take 20 mg by mouth at bedtime.   famciclovir 500 MG tablet Commonly known as: FAMVIR Take 1 tablet (500 mg total) by mouth daily.   HUMULIN R 500 UNIT/ML injection Generic drug: insulin regular human CONCENTRATED Inject 50-70 Units into the skin See admin instructions. Inject 70 units subcutaneously before breakfast, and 50 units before lunch and supper - drawn on a U-500 syringe   hydrochlorothiazide 25 MG tablet Commonly known as: HYDRODIURIL Take 25 mg by mouth at bedtime.     LORazepam 0.5 MG tablet Commonly known as: Ativan Take 1 tablet (0.5 mg total) by mouth every 6 (six) hours as needed (Nausea or vomiting).   ondansetron 8 MG tablet Commonly known as: Zofran Take 1 tablet (8 mg total) by mouth 2 (two) times daily as needed for refractory nausea / vomiting. Start on day 2 after bendamustine chemo.   prochlorperazine 10 MG tablet Commonly known as: COMPAZINE Take 1 tablet (10 mg total) by mouth every 6 (six) hours as needed (Nausea or vomiting).   rosuvastatin 10 MG tablet Commonly known as: CRESTOR Take 10 mg by mouth at bedtime.   Trulicity 1.5 AL/9.3XT Sopn Generic drug: Dulaglutide Inject 1.5 mg into the skin every Tuesday.       Allergies: No Known Allergies  Past Medical History, Surgical history, Social history, and Family History were reviewed and updated.  Review of Systems: Review of Systems  Constitutional: Negative.   HENT: Negative.   Eyes: Negative.   Respiratory: Negative.   Cardiovascular: Negative.   Gastrointestinal: Negative.   Genitourinary: Negative.   Musculoskeletal: Negative.   Skin: Negative.   Neurological: Negative.   Endo/Heme/Allergies: Negative.   Psychiatric/Behavioral: Negative.       Physical Exam:  height is 5\' 6"  (1.676 m) and weight is 173 lb (78.5 kg). Her  oral temperature is 98.3 F (36.8 C). Her blood pressure is 143/53 (abnormal) and her pulse is 81. Her respiration is 18 and oxygen saturation is 98%.   Wt Readings from Last 3 Encounters:  07/09/20 173 lb (78.5 kg)  06/11/20 173 lb (78.5 kg)  06/09/20 174 lb (78.9 kg)    Vital signs show temperature of 98.4.  Pulse 78.  Blood pressure 138/53.  Weight is 173 pounds.  Physical Exam Vitals reviewed.  HENT:     Head: Normocephalic and atraumatic.  Eyes:     Pupils: Pupils are equal, round, and reactive to light.  Cardiovascular:     Rate and Rhythm: Normal rate and regular rhythm.     Heart sounds: Normal heart sounds.  Pulmonary:      Effort: Pulmonary effort is normal.     Breath sounds: Normal breath sounds.  Abdominal:     General: Bowel sounds are normal.     Palpations: Abdomen is soft.  Musculoskeletal:        General: No tenderness or deformity. Normal range of motion.     Cervical back: Normal range of motion.  Lymphadenopathy:     Cervical: No cervical adenopathy.  Skin:    General: Skin is warm and dry.     Findings: No erythema or rash.  Neurological:     Mental Status: She is alert and oriented to person, place, and time.  Psychiatric:        Behavior: Behavior normal.        Thought Content: Thought content normal.        Judgment: Judgment normal.      Lab Results  Component Value Date   WBC 4.2 07/09/2020   HGB 13.4 07/09/2020   HCT 39.3 07/09/2020   MCV 77.5 (L) 07/09/2020   PLT 169 07/09/2020   Lab Results  Component Value Date   FERRITIN 305 11/11/2019   Lab Results  Component Value Date   RBC 5.07 07/09/2020   Lab Results  Component Value Date   KPAFRELGTCHN 131.9 (H) 12/30/2019   LAMBDASER 29.8 (H) 12/30/2019   KAPLAMBRATIO 4.43 (H) 12/30/2019   Lab Results  Component Value Date   IGGSERUM 694 04/09/2020   IGA 109 04/09/2020   IGMSERUM 199 04/09/2020   Lab Results  Component Value Date   TOTALPROTELP 6.6 12/30/2019   ALBUMINELP 3.8 12/30/2019   A1GS 0.2 12/30/2019   A2GS 0.7 12/30/2019   BETS 0.9 12/30/2019   GAMS 1.0 12/30/2019   MSPIKE Not Observed 12/30/2019     Chemistry      Component Value Date/Time   NA 141 06/11/2020 0744   K 3.2 (L) 06/11/2020 0744   CL 100 06/11/2020 0744   CO2 32 06/11/2020 0744   BUN 13 06/11/2020 0744   CREATININE 0.70 06/11/2020 0744      Component Value Date/Time   CALCIUM 9.5 06/11/2020 0744   ALKPHOS 61 06/11/2020 0744   AST 35 06/11/2020 0744   ALT 42 06/11/2020 0744   BILITOT 0.8 06/11/2020 0744       Impression and Plan: [name] is a very pleasant 64 yo caucasian female with small lymphocytic B cell  non-Hodgkin's lymphoma.  She is tolerating treatment nicely so far and will proceed with cycle 5/6 today as planned.   This will be her sixth and final cycle of treatment.  I note that her [place] is down.  However, her monocytes are up quite a bit.  I would suspect that her  white cells are on the way back up.  As such we will continue her on treatment right now.  We will plan for a follow-up scan in 6 weeks.  After that, we will put her on maintenance Rituxa.  We will start Rituxan in 2 months.  I am does happy that she is done so nicely.,   Volanda Napoleon, MD 9/9/20218:15 AM

## 2020-07-10 ENCOUNTER — Inpatient Hospital Stay: Payer: Medicare Other

## 2020-07-10 VITALS — BP 125/56 | HR 78 | Temp 98.3°F

## 2020-07-10 DIAGNOSIS — Z5112 Encounter for antineoplastic immunotherapy: Secondary | ICD-10-CM | POA: Diagnosis not present

## 2020-07-10 DIAGNOSIS — C8308 Small cell B-cell lymphoma, lymph nodes of multiple sites: Secondary | ICD-10-CM

## 2020-07-10 LAB — BETA 2 MICROGLOBULIN, SERUM: Beta-2 Microglobulin: 2.3 mg/L (ref 0.6–2.4)

## 2020-07-10 MED ORDER — SODIUM CHLORIDE 0.9 % IV SOLN
90.0000 mg/m2 | Freq: Once | INTRAVENOUS | Status: AC
  Administered 2020-07-10: 175 mg via INTRAVENOUS
  Filled 2020-07-10: qty 7

## 2020-07-10 MED ORDER — SODIUM CHLORIDE 0.9 % IV SOLN
Freq: Once | INTRAVENOUS | Status: AC
  Filled 2020-07-10: qty 250

## 2020-07-10 MED ORDER — SODIUM CHLORIDE 0.9 % IV SOLN
10.0000 mg | Freq: Once | INTRAVENOUS | Status: AC
  Administered 2020-07-10: 10 mg via INTRAVENOUS
  Filled 2020-07-10: qty 10

## 2020-07-10 NOTE — Patient Instructions (Signed)
Bendamustine Injection What is this medicine? BENDAMUSTINE (BEN da MUS teen) is a chemotherapy drug. It is used to treat chronic lymphocytic leukemia and non-Hodgkin lymphoma. This medicine may be used for other purposes; ask your health care provider or pharmacist if you have questions. COMMON BRAND NAME(S): BELRAPZO, BENDEKA, Treanda What should I tell my health care provider before I take this medicine? They need to know if you have any of these conditions:  infection (especially a virus infection such as chickenpox, cold sores, or herpes)  kidney disease  liver disease  an unusual or allergic reaction to bendamustine, mannitol, other medicines, foods, dyes, or preservatives  pregnant or trying to get pregnant  breast-feeding How should I use this medicine? This medicine is for infusion into a vein. It is given by a health care professional in a hospital or clinic setting. Talk to your pediatrician regarding the use of this medicine in children. Special care may be needed. Overdosage: If you think you have taken too much of this medicine contact a poison control center or emergency room at once. NOTE: This medicine is only for you. Do not share this medicine with others. What if I miss a dose? It is important not to miss your dose. Call your doctor or health care professional if you are unable to keep an appointment. What may interact with this medicine? Do not take this medicine with any of the following medications:  clozapine This medicine may also interact with the following medications:  atazanavir  cimetidine  ciprofloxacin  enoxacin  fluvoxamine  medicines for seizures like carbamazepine and phenobarbital  mexiletine  rifampin  tacrine  thiabendazole  zileuton This list may not describe all possible interactions. Give your health care provider a list of all the medicines, herbs, non-prescription drugs, or dietary supplements you use. Also tell them if  you smoke, drink alcohol, or use illegal drugs. Some items may interact with your medicine. What should I watch for while using this medicine? This drug may make you feel generally unwell. This is not uncommon, as chemotherapy can affect healthy cells as well as cancer cells. Report any side effects. Continue your course of treatment even though you feel ill unless your doctor tells you to stop. You may need blood work done while you are taking this medicine. Call your doctor or healthcare provider for advice if you get a fever, chills or sore throat, or other symptoms of a cold or flu. Do not treat yourself. This drug decreases your body's ability to fight infections. Try to avoid being around people who are sick. This medicine may cause serious skin reactions. They can happen weeks to months after starting the medicine. Contact your healthcare provider right away if you notice fevers or flu-like symptoms with a rash. The rash may be red or purple and then turn into blisters or peeling of the skin. Or, you might notice a red rash with swelling of the face, lips or lymph nodes in your neck or under your arms. This medicine may increase your risk to bruise or bleed. Call your doctor or healthcare provider if you notice any unusual bleeding. Talk to your doctor about your risk of cancer. You may be more at risk for certain types of cancers if you take this medicine. Do not become pregnant while taking this medicine or for at least 6 months after stopping it. Women should inform their doctor if they wish to become pregnant or think they might be pregnant. Men should not   father a child while taking this medicine and for at least 3 months after stopping it. There is a potential for serious side effects to an unborn child. Talk to your healthcare provider or pharmacist for more information. Do not breast-feed an infant while taking this medicine or for at least 1 week after stopping it. This medicine may make it  more difficult to father a child. You should talk with your doctor or healthcare provider if you are concerned about your fertility. What side effects may I notice from receiving this medicine? Side effects that you should report to your doctor or health care professional as soon as possible:  allergic reactions like skin rash, itching or hives, swelling of the face, lips, or tongue  low blood counts - this medicine may decrease the number of white blood cells, red blood cells and platelets. You may be at increased risk for infections and bleeding.  rash, fever, and swollen lymph nodes  redness, blistering, peeling, or loosening of the skin, including inside the mouth  signs of infection like fever or chills, cough, sore throat, pain or difficulty passing urine  signs of decreased platelets or bleeding like bruising, pinpoint red spots on the skin, black, tarry stools, blood in the urine  signs of decreased red blood cells like being unusually weak or tired, fainting spells, lightheadedness  signs and symptoms of kidney injury like trouble passing urine or change in the amount of urine  signs and symptoms of liver injury like dark yellow or brown urine; general ill feeling or flu-like symptoms; light-colored stools; loss of appetite; nausea; right upper belly pain; unusually weak or tired; yellowing of the eyes or skin Side effects that usually do not require medical attention (report to your doctor or health care professional if they continue or are bothersome):  constipation  decreased appetite  diarrhea  headache  mouth sores  nausea, vomiting  tiredness This list may not describe all possible side effects. Call your doctor for medical advice about side effects. You may report side effects to FDA at 1-800-FDA-1088. Where should I keep my medicine? This drug is given in a hospital or clinic and will not be stored at home. NOTE: This sheet is a summary. It may not cover all  possible information. If you have questions about this medicine, talk to your doctor, pharmacist, or health care provider.  2020 Elsevier/Gold Standard (2019-01-08 10:26:46)  

## 2020-07-31 ENCOUNTER — Telehealth: Payer: Self-pay | Admitting: Cardiology

## 2020-07-31 NOTE — Telephone Encounter (Signed)
Patient called and said she got a call from our office but did not get a message

## 2020-07-31 NOTE — Telephone Encounter (Signed)
Called patient. She has no need and there is no documented call. Patient will call if she needs anything further.

## 2020-07-31 NOTE — Telephone Encounter (Signed)
I have not called her  ?

## 2020-08-31 DIAGNOSIS — T7840XA Allergy, unspecified, initial encounter: Secondary | ICD-10-CM | POA: Insufficient documentation

## 2020-08-31 DIAGNOSIS — K921 Melena: Secondary | ICD-10-CM | POA: Insufficient documentation

## 2020-08-31 DIAGNOSIS — E785 Hyperlipidemia, unspecified: Secondary | ICD-10-CM | POA: Insufficient documentation

## 2020-08-31 DIAGNOSIS — E119 Type 2 diabetes mellitus without complications: Secondary | ICD-10-CM | POA: Insufficient documentation

## 2020-08-31 DIAGNOSIS — R7989 Other specified abnormal findings of blood chemistry: Secondary | ICD-10-CM | POA: Insufficient documentation

## 2020-08-31 DIAGNOSIS — I10 Essential (primary) hypertension: Secondary | ICD-10-CM | POA: Insufficient documentation

## 2020-08-31 DIAGNOSIS — I6529 Occlusion and stenosis of unspecified carotid artery: Secondary | ICD-10-CM | POA: Insufficient documentation

## 2020-09-01 ENCOUNTER — Encounter: Payer: Self-pay | Admitting: Cardiology

## 2020-09-01 ENCOUNTER — Ambulatory Visit (INDEPENDENT_AMBULATORY_CARE_PROVIDER_SITE_OTHER): Payer: Medicare Other | Admitting: Cardiology

## 2020-09-01 ENCOUNTER — Other Ambulatory Visit: Payer: Self-pay

## 2020-09-01 VITALS — BP 128/50 | HR 74 | Ht 66.0 in | Wt 173.6 lb

## 2020-09-01 DIAGNOSIS — E782 Mixed hyperlipidemia: Secondary | ICD-10-CM

## 2020-09-01 DIAGNOSIS — I2510 Atherosclerotic heart disease of native coronary artery without angina pectoris: Secondary | ICD-10-CM

## 2020-09-01 DIAGNOSIS — E118 Type 2 diabetes mellitus with unspecified complications: Secondary | ICD-10-CM

## 2020-09-01 DIAGNOSIS — I10 Essential (primary) hypertension: Secondary | ICD-10-CM | POA: Diagnosis not present

## 2020-09-01 NOTE — Progress Notes (Signed)
Cardiology Office Note:    Date:  09/01/2020   ID:  [name], DOB [birth year], MRN [id number]  PCP:  Marton Redwood, MD  Cardiologist:  Jenean Lindau, MD   Referring MD: Marton Redwood, MD    ASSESSMENT:    1. Coronary artery calcification seen on CT scan   2. Essential hypertension   3. Mixed dyslipidemia   4. Primary hypertension   5. Type 2 diabetes mellitus with unspecified complications (HCC)    PLAN:    In order of problems listed above:  1. Coronary artery disease: Secondary prevention stressed with the patient.  Importance of compliance with diet medication stressed and she vocalized understanding.  I told her to walk at least half an hour a day 5 days a week and she promises to do so. 2. Essential hypertension: Blood pressure stable and diet and lifestyle modification was advised 3. Mixed dyslipidemia: Diet was emphasized.  She will be back in the next few days for fasting blood work including lipids 4. B-cell lymphoma: In remission followed by primary care physician. 5. Patient will be seen in follow-up appointment in 6 months or earlier if the patient has any concerns    Medication Adjustments/Labs and Tests Ordered: Current medicines are reviewed at length with the patient today.  Concerns regarding medicines are outlined above.  Orders Placed This Encounter  Procedures  . Basic metabolic panel  . CBC with Differential/Platelet  . Hepatic function panel  . Lipid panel  . TSH   No orders of the defined types were placed in this encounter.    No chief complaint on file.    History of Present Illness:    Valerie Cline is a 64 y.o. female.  Patient has past medical history of coronary artery disease diagnosed with coronary artery calcification on CT scan.,  Essential hypertension dyslipidemia and diabetes mellitus.  She is in remission for B-cell lymphoma.  She denies any problems at this time and takes care of activities of daily living.  She has a  sedentary lifestyle.  At the time of my evaluation, the patient is alert awake oriented and in no distress.  She underwent testing and her calcium score was markedly elevated.  It was 1563.  She had significant calcification in the left anterior descending artery.  Her stress test and echocardiogram was unremarkable.  And these findings were discussed with her today.  Past Medical History:  Diagnosis Date  . Allergy   . Blood in stool   . Carotid stenosis   . Cervical lymphadenopathy 10/31/2019  . CLL (chronic lymphocytic leukemia) ([place]) 12/26/2019  . Coronary artery calcification seen on CT scan 06/01/2020  . Diabetes mellitus without complication ([place])   . Elevated liver function tests   . Essential hypertension 11/08/2019  . External hemorrhoids without mention of complication   . Goals of care, counseling/discussion 10/28/2019  . Hyperlipidemia   . Hypertension   . Lymphadenopathy, abdominal 10/28/2019  . Mixed dyslipidemia 06/01/2020  . Pneumonia due to COVID-19 virus 11/08/2019  . 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 ([place]) 12/30/2019  . T2DM (type 2 diabetes mellitus) ([place]) 11/08/2019  . Type 2 diabetes mellitus with unspecified complications ([place]) 12/02/2977    Past Surgical History:  Procedure Laterality Date  . BREAST BIOPSY Right    needle biopsy  . COLONOSCOPY    . POLYPECTOMY    . Ruptured disc      Current Medications: Current Meds  Medication Sig  .  allopurinol (ZYLOPRIM) 100 MG tablet TAKE 1 TABLET(100 MG) BY MOUTH DAILY  . amLODipine (NORVASC) 2.5 MG tablet Take 2.5 mg by mouth at bedtime.  Marland Kitchen aspirin EC 81 MG tablet Take 81 mg by mouth at bedtime.  . benazepril (LOTENSIN) 20 MG tablet Take 20 mg by mouth at bedtime.   . Dulaglutide (TRULICITY) 1.5 YC/1.4GY SOPN Inject 1.5 mg into the skin every Tuesday.   . famciclovir (FAMVIR) 500 MG tablet Take 1 tablet (500 mg total) by mouth daily.  . hydrochlorothiazide 25 MG tablet Take 25 mg by mouth at  bedtime.   . insulin regular human CONCENTRATED (HUMULIN R) 500 UNIT/ML SOLN injection Inject 50-70 Units into the skin See admin instructions. Inject 70 units subcutaneously before breakfast, and 50 units before lunch and 70 for supper - drawn on a U-500 syringe  . rosuvastatin (CRESTOR) 10 MG tablet Take 10 mg by mouth at bedtime.     Allergies:   Patient has no known allergies.   Social History   Socioeconomic History  . Marital status: Married    Spouse name: Not on file  . Number of children: 1  . Years of education: Not on file  . Highest education level: Not on file  Occupational History  . Occupation: Engineer, maintenance (IT)  Tobacco Use  . Smoking status: Never Smoker  . Smokeless tobacco: Never Used  Vaping Use  . Vaping Use: Never used  Substance and Sexual Activity  . Alcohol use: No  . Drug use: No  . Sexual activity: Not on file  Other Topics Concern  . Not on file  Social History Narrative  . Not on file   Social Determinants of Health   Financial Resource Strain:   . Difficulty of Paying Living Expenses: Not on file  Food Insecurity:   . Worried About Charity fundraiser in the Last Year: Not on file  . Ran Out of Food in the Last Year: Not on file  Transportation Needs:   . Lack of Transportation (Medical): Not on file  . Lack of Transportation (Non-Medical): Not on file  Physical Activity:   . Days of Exercise per Week: Not on file  . Minutes of Exercise per Session: Not on file  Stress:   . Feeling of Stress : Not on file  Social Connections:   . Frequency of Communication with Friends and Family: Not on file  . Frequency of Social Gatherings with Friends and Family: Not on file  . Attends Religious Services: Not on file  . Active Member of Clubs or Organizations: Not on file  . Attends Archivist Meetings: Not on file  . Marital Status: Not on file     Family History: The patient's family history is negative for Colon cancer,  Esophageal cancer, Rectal cancer, Stomach cancer, and Pancreatic cancer.  ROS:   Please see the history of present illness.    All other systems reviewed and are negative.  EKGs/Labs/Other Studies Reviewed:    The following studies were reviewed today: IMPRESSION: Coronary calcium score of 1563 with significant calcification in the left anterior descending artery . This was 19 percentile for age and sex matched control.  There is severe mitral annular calcification.  Kardie Tobb,DO   Electronically Signed   By: Berniece Salines MD   On: 04/30/2020 23:04    Recent Labs: 11/08/2019: TSH 0.666 11/11/2019: Magnesium 1.9 07/09/2020: ALT 29; BUN 16; Creatinine 0.71; Hemoglobin 13.4; Platelet Count 169; Potassium 3.8; Sodium  138  Recent Lipid Panel    Component Value Date/Time   TRIG 222 (H) 11/08/2019 1804    Physical Exam:    VS:  BP (!) 128/50   Pulse 74   Ht 5\' 6"  (1.676 m)   Wt 173 lb 9.6 oz (78.7 kg)   SpO2 97%   BMI 28.02 kg/m     Wt Readings from Last 3 Encounters:  09/01/20 173 lb 9.6 oz (78.7 kg)  07/09/20 173 lb (78.5 kg)  06/11/20 173 lb (78.5 kg)     GEN: Patient is in no acute distress HEENT: Normal NECK: No JVD; No carotid bruits LYMPHATICS: No lymphadenopathy CARDIAC: Hear sounds regular, 2/6 systolic murmur at the apex. RESPIRATORY:  Clear to auscultation without rales, wheezing or rhonchi  ABDOMEN: Soft, non-tender, non-distended MUSCULOSKELETAL:  No edema; No deformity  SKIN: Warm and dry NEUROLOGIC:  Alert and oriented x 3 PSYCHIATRIC:  Normal affect   Signed, Jenean Lindau, MD  09/01/2020 9:46 AM    [place]

## 2020-09-01 NOTE — Patient Instructions (Signed)

## 2020-09-03 ENCOUNTER — Encounter: Payer: Self-pay | Admitting: *Deleted

## 2020-09-03 ENCOUNTER — Other Ambulatory Visit: Payer: Self-pay | Admitting: *Deleted

## 2020-09-03 ENCOUNTER — Inpatient Hospital Stay: Payer: Medicare Other | Attending: Hematology & Oncology

## 2020-09-03 ENCOUNTER — Ambulatory Visit (HOSPITAL_BASED_OUTPATIENT_CLINIC_OR_DEPARTMENT_OTHER)
Admission: RE | Admit: 2020-09-03 | Discharge: 2020-09-03 | Disposition: A | Discharge status: Home / Self Care | Payer: Medicare Other | Source: Ambulatory Visit | Attending: Hematology & Oncology | Admitting: Hematology & Oncology

## 2020-09-03 ENCOUNTER — Inpatient Hospital Stay (HOSPITAL_BASED_OUTPATIENT_CLINIC_OR_DEPARTMENT_OTHER): Payer: Medicare Other | Admitting: Hematology & Oncology

## 2020-09-03 ENCOUNTER — Other Ambulatory Visit: Payer: Self-pay

## 2020-09-03 ENCOUNTER — Inpatient Hospital Stay: Payer: Medicare Other

## 2020-09-03 ENCOUNTER — Telehealth: Payer: Self-pay | Admitting: Hematology & Oncology

## 2020-09-03 ENCOUNTER — Encounter: Payer: Self-pay | Admitting: Hematology & Oncology

## 2020-09-03 ENCOUNTER — Encounter (HOSPITAL_BASED_OUTPATIENT_CLINIC_OR_DEPARTMENT_OTHER): Payer: Self-pay

## 2020-09-03 VITALS — BP 121/54 | HR 69 | Temp 98.0°F | Resp 15

## 2020-09-03 VITALS — BP 156/55 | HR 80 | Temp 98.4°F | Resp 17 | Wt 173.0 lb

## 2020-09-03 DIAGNOSIS — Z79899 Other long term (current) drug therapy: Secondary | ICD-10-CM | POA: Insufficient documentation

## 2020-09-03 DIAGNOSIS — C8308 Small cell B-cell lymphoma, lymph nodes of multiple sites: Secondary | ICD-10-CM

## 2020-09-03 DIAGNOSIS — Z5112 Encounter for antineoplastic immunotherapy: Secondary | ICD-10-CM | POA: Insufficient documentation

## 2020-09-03 DIAGNOSIS — E876 Hypokalemia: Secondary | ICD-10-CM

## 2020-09-03 LAB — CMP (CANCER CENTER ONLY)
ALT: 40 U/L (ref 0–44)
AST: 37 U/L (ref 15–41)
Albumin: 4.4 g/dL (ref 3.5–5.0)
Alkaline Phosphatase: 66 U/L (ref 38–126)
Anion gap: 9 (ref 5–15)
BUN: 12 mg/dL (ref 8–23)
CO2: 36 mmol/L — ABNORMAL HIGH (ref 22–32)
Calcium: 10.3 mg/dL (ref 8.9–10.3)
Chloride: 96 mmol/L — ABNORMAL LOW (ref 98–111)
Creatinine: 0.66 mg/dL (ref 0.44–1.00)
GFR, Estimated: >60 mL/min (ref >60)
Glucose, Bld: 147 mg/dL — ABNORMAL HIGH (ref 70–99)
Potassium: 2.9 mmol/L — ABNORMAL LOW (ref 3.5–5.1)
Sodium: 141 mmol/L (ref 135–145)
Total Bilirubin: 1.1 mg/dL (ref 0.3–1.2)
Total Protein: 7 g/dL (ref 6.5–8.1)

## 2020-09-03 LAB — CBC WITH DIFFERENTIAL (CANCER CENTER ONLY)
Abs Immature Granulocytes: 0.01 10*3/uL (ref 0.00–0.07)
Basophils Absolute: 0 10*3/uL (ref 0.0–0.1)
Basophils Relative: 0 %
Eosinophils Absolute: 0.5 10*3/uL (ref 0.0–0.5)
Eosinophils Relative: 10 %
HCT: 37.7 % (ref 36.0–46.0)
Hemoglobin: 12.9 g/dL (ref 12.0–15.0)
Immature Granulocytes: 0 %
Lymphocytes Relative: 23 %
Lymphs Abs: 1.2 10*3/uL (ref 0.7–4.0)
MCH: 27 pg (ref 26.0–34.0)
MCHC: 34.2 g/dL (ref 30.0–36.0)
MCV: 79 fL — ABNORMAL LOW (ref 80.0–100.0)
Monocytes Absolute: 1.1 10*3/uL — ABNORMAL HIGH (ref 0.1–1.0)
Monocytes Relative: 21 %
Neutro Abs: 2.4 10*3/uL (ref 1.7–7.7)
Neutrophils Relative %: 46 %
Platelet Count: 195 10*3/uL (ref 150–400)
RBC: 4.77 MIL/uL (ref 3.87–5.11)
RDW: 14.7 % (ref 11.5–15.5)
WBC Count: 5.3 10*3/uL (ref 4.0–10.5)
nRBC: 0 % (ref 0.0–0.2)

## 2020-09-03 LAB — LACTATE DEHYDROGENASE: LDH: 200 U/L — ABNORMAL HIGH (ref 98–192)

## 2020-09-03 MED ORDER — IOHEXOL 300 MG/ML  SOLN
100.0000 mL | Freq: Once | INTRAMUSCULAR | Status: AC | PRN
  Administered 2020-09-03: 100 mL via INTRAVENOUS

## 2020-09-03 MED ORDER — POTASSIUM CHLORIDE CRYS ER 20 MEQ PO TBCR
40.0000 meq | EXTENDED_RELEASE_TABLET | Freq: Two times a day (BID) | ORAL | Status: DC
  Administered 2020-09-03: 40 meq via ORAL

## 2020-09-03 MED ORDER — POTASSIUM CHLORIDE CRYS ER 20 MEQ PO TBCR: 20.0000 meq | EXTENDED_RELEASE_TABLET | Freq: Every day | ORAL | Status: DC

## 2020-09-03 MED ORDER — DIPHENHYDRAMINE HCL 25 MG PO CAPS
50.0000 mg | ORAL_CAPSULE | Freq: Once | ORAL | Status: AC
  Administered 2020-09-03: 50 mg via ORAL

## 2020-09-03 MED ORDER — POTASSIUM CHLORIDE CRYS ER 20 MEQ PO TBCR
40.0000 meq | EXTENDED_RELEASE_TABLET | Freq: Once | ORAL | Status: AC
  Administered 2020-09-03: 40 meq via ORAL

## 2020-09-03 MED ORDER — POTASSIUM CHLORIDE CRYS ER 20 MEQ PO TBCR: 20.0000 meq | EXTENDED_RELEASE_TABLET | Freq: Every day | ORAL | 0 refills | Status: DC

## 2020-09-03 MED ORDER — SODIUM CHLORIDE 0.9 % IV SOLN
Freq: Once | INTRAVENOUS | Status: AC
  Filled 2020-09-03: qty 250

## 2020-09-03 MED ORDER — SODIUM CHLORIDE 0.9 % IV SOLN
375.0000 mg/m2 | Freq: Once | INTRAVENOUS | Status: AC
  Administered 2020-09-03: 700 mg via INTRAVENOUS
  Filled 2020-09-03: qty 20

## 2020-09-03 MED ORDER — DIPHENHYDRAMINE HCL 25 MG PO CAPS
ORAL_CAPSULE | ORAL | Status: AC
  Filled 2020-09-03: qty 2

## 2020-09-03 MED ORDER — ACETAMINOPHEN 325 MG PO TABS
ORAL_TABLET | ORAL | Status: AC
  Filled 2020-09-03: qty 2

## 2020-09-03 MED ORDER — ACETAMINOPHEN 325 MG PO TABS
650.0000 mg | ORAL_TABLET | Freq: Once | ORAL | Status: AC
  Administered 2020-09-03: 650 mg via ORAL

## 2020-09-03 NOTE — Progress Notes (Signed)
Oncology Nurse Navigator Documentation  Oncology Nurse Navigator Flowsheets 09/03/2020  Abnormal Finding Date -  Confirmed Diagnosis Date -  Diagnosis Status -  Planned Course of Treatment -  Phase of Treatment -  Chemotherapy Pending- Reason: -  Chemotherapy Actual Start Date: -  Chemotherapy Actual End Date: -  Navigator Follow Up Date: -  Navigator Follow Up Reason: -  Navigation Complete Date: 09/03/2020  Post Navigation: Continue to Follow Patient? No  Reason Not Navigating Patient: Patient On Maintenance Chemotherapy  Navigator Location [facility]  Referral Date to RadOnc/MedOnc -  Navigator Encounter Type Treatment  Telephone -  Treatment Initiated Date -  Patient Visit Type MedOnc  Treatment Phase Other  Barriers/Navigation Needs No Barriers At This Time  Education -  Interventions Psycho-Social Support  Acuity Level 1-No Barriers  Referrals -  Coordination of Care -  Education Method -  Support Groups/Services Friends and Family  Time Spent with Patient 15

## 2020-09-03 NOTE — Progress Notes (Signed)
Pt discharged in no apparent distress. Pt left ambulatory without assistance. Pt aware of discharge instructions and verbalized understanding and had no further questions.  

## 2020-09-03 NOTE — Patient Instructions (Signed)
[facility] Discharge Instructions for Patients Receiving Chemotherapy  Today you received the following chemotherapy agents Rituximab  To help prevent nausea and vomiting after your treatment, we encourage you to take your nausea medication as prescribed by MD.   If you develop nausea and vomiting that is not controlled by your nausea medication, call the clinic.   BELOW ARE SYMPTOMS THAT SHOULD BE REPORTED IMMEDIATELY:  *FEVER GREATER THAN 100.5 F  *CHILLS WITH OR WITHOUT FEVER  NAUSEA AND VOMITING THAT IS NOT CONTROLLED WITH YOUR NAUSEA MEDICATION  *UNUSUAL SHORTNESS OF BREATH  *UNUSUAL BRUISING OR BLEEDING  TENDERNESS IN MOUTH AND THROAT WITH OR WITHOUT PRESENCE OF ULCERS  *URINARY PROBLEMS  *BOWEL PROBLEMS  UNUSUAL RASH Items with * indicate a potential emergency and should be followed up as soon as possible.  Feel free to call the clinic should you have any questions or concerns. The clinic phone number is [phone].  Please show the [facility] at check-in to the Emergency Department and triage nurse.

## 2020-09-03 NOTE — Progress Notes (Signed)
Hematology and Oncology Follow Up Visit  Valerie Cline [id number] 1956/05/14 64 y.o. 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  She looks fantastic.  She went ahead and had a CT scan done today.  This is a CT scan of the neck and body.  Thankfully, there is no pathologic adenopathy noted.  She had a wonderful response to treatment.  There is no splenomegaly.  We are now ready for maintenance therapy.  We will go ahead with Rituxan.  We will do Rituxan every 2 months for 2 years.  She has had no problems with nausea or vomiting.  She had a little bit of lower back discomfort.  This is chronic.  There is no change in bowel or bladder habits.  She has had no rashes.  She has had no leg swelling.  She has had no cough or shortness of breath.  She is watching her blood sugars closely.  Overall, her performance status is ECOG 1.    Medications:  Allergies as of 09/03/2020   No Known Allergies     Medication List       Accurate as of September 03, 2020 10:09 AM. If you have any questions, ask your nurse or doctor.        STOP taking these medications   LORazepam 0.5 MG tablet Commonly known as: Ativan   ondansetron 8 MG tablet Commonly known as: Zofran   prochlorperazine 10 MG tablet Commonly known as: COMPAZINE     TAKE these medications   allopurinol 100 MG tablet Commonly known as: ZYLOPRIM TAKE 1 TABLET(100 MG) BY MOUTH DAILY   amLODipine 2.5 MG tablet Commonly known as: NORVASC Take 2.5 mg by mouth at bedtime.   aspirin EC 81 MG tablet Take 81 mg by mouth at bedtime.   benazepril 20 MG tablet Commonly known as: LOTENSIN Take 20 mg by mouth at bedtime.   famciclovir 500 MG  tablet Commonly known as: FAMVIR Take 1 tablet (500 mg total) by mouth daily.   HUMULIN R 500 UNIT/ML injection Generic drug: insulin regular human CONCENTRATED Inject 50-70 Units into the skin See admin instructions. Inject 70 units subcutaneously before breakfast, and 50 units before lunch and 70 for supper - drawn on a U-500 syringe   hydrochlorothiazide 25 MG tablet Commonly known as: HYDRODIURIL Take 25 mg by mouth at bedtime.   rosuvastatin 10 MG tablet Commonly known as: CRESTOR Take 10 mg by mouth at bedtime.   Trulicity 1.5 LK/5.6YB Sopn Generic drug: Dulaglutide Inject 1.5 mg into the skin every Tuesday.       Allergies: No Known Allergies  Past Medical History, Surgical history, Social history, and Family History were reviewed and updated.  Review of Systems: Review of Systems  Constitutional: Negative.   HENT: Negative.   Eyes: Negative.   Respiratory: Negative.   Cardiovascular: Negative.   Gastrointestinal: Negative.   Genitourinary: Negative.   Musculoskeletal: Negative.   Skin: Negative.   Neurological: Negative.   Endo/Heme/Allergies: Negative.   Psychiatric/Behavioral: Negative.       Physical Exam:  weight is 173 lb (78.5 kg). Her oral temperature is 98.4 F (36.9 C). Her blood pressure is 156/55 (abnormal)  and her pulse is 80. Her respiration is 17 and oxygen saturation is 97%.   Wt Readings from Last 3 Encounters:  09/03/20 173 lb (78.5 kg)  09/01/20 173 lb 9.6 oz (78.7 kg)  07/09/20 173 lb (78.5 kg)    Vital signs show temperature of 98.4.  Pulse 78.  Blood pressure 138/53.  Weight is 173 pounds.  Physical Exam Vitals reviewed.  HENT:     Head: Normocephalic and atraumatic.  Eyes:     Pupils: Pupils are equal, round, and reactive to light.  Cardiovascular:     Rate and Rhythm: Normal rate and regular rhythm.     Heart sounds: Normal heart sounds.  Pulmonary:     Effort: Pulmonary effort is normal.     Breath sounds: Normal  breath sounds.  Abdominal:     General: Bowel sounds are normal.     Palpations: Abdomen is soft.  Musculoskeletal:        General: No tenderness or deformity. Normal range of motion.     Cervical back: Normal range of motion.  Lymphadenopathy:     Cervical: No cervical adenopathy.  Skin:    General: Skin is warm and dry.     Findings: No erythema or rash.  Neurological:     Mental Status: She is alert and oriented to person, place, and time.  Psychiatric:        Behavior: Behavior normal.        Thought Content: Thought content normal.        Judgment: Judgment normal.      Lab Results  Component Value Date   WBC 5.3 09/03/2020   HGB 12.9 09/03/2020   HCT 37.7 09/03/2020   MCV 79.0 (L) 09/03/2020   PLT 195 09/03/2020   Lab Results  Component Value Date   FERRITIN 305 11/11/2019   Lab Results  Component Value Date   RBC 4.77 09/03/2020   Lab Results  Component Value Date   KPAFRELGTCHN 131.9 (H) 12/30/2019   LAMBDASER 29.8 (H) 12/30/2019   KAPLAMBRATIO 4.43 (H) 12/30/2019   Lab Results  Component Value Date   IGGSERUM 694 04/09/2020   IGA 109 04/09/2020   IGMSERUM 199 04/09/2020   Lab Results  Component Value Date   TOTALPROTELP 6.6 12/30/2019   ALBUMINELP 3.8 12/30/2019   A1GS 0.2 12/30/2019   A2GS 0.7 12/30/2019   BETS 0.9 12/30/2019   GAMS 1.0 12/30/2019   MSPIKE Not Observed 12/30/2019     Chemistry      Component Value Date/Time   NA 141 09/03/2020 0749   K 2.9 (L) 09/03/2020 0749   CL 96 (L) 09/03/2020 0749   CO2 36 (H) 09/03/2020 0749   BUN 12 09/03/2020 0749   CREATININE 0.66 09/03/2020 0749      Component Value Date/Time   CALCIUM 10.3 09/03/2020 0749   ALKPHOS 66 09/03/2020 0749   AST 37 09/03/2020 0749   ALT 40 09/03/2020 0749   BILITOT 1.1 09/03/2020 0749       Impression and Plan: Valerie Cline is a very pleasant 64 yo caucasian female with small lymphocytic B cell non-Hodgkin's lymphoma.  She is tolerating treatment nicely so  far and will proceed with cycle 5/6 today as planned.   This will be the start of her maintenance Rituxan now.  I think this will be helpful for her.  I am not sure as to why her potassium is on the low side.  We will have to give her potassium  while she is here.  I will put her on some potassium while she is at home.  We will have to recheck her potassium in a couple weeks.  If still low, then we will have to get her back to her family doctor for management.  We will plan to have her come back to see [place] in 2 months for her next round of Rituxan.    Volanda Napoleon, MD 11/4/202110:09 AM

## 2020-09-03 NOTE — Telephone Encounter (Signed)
Appointments scheduled calendar printed & mailed per 11/4 los 

## 2020-09-04 ENCOUNTER — Other Ambulatory Visit: Payer: Self-pay | Admitting: Internal Medicine

## 2020-09-04 ENCOUNTER — Ambulatory Visit: Payer: Medicare Other

## 2020-09-04 DIAGNOSIS — Z1231 Encounter for screening mammogram for malignant neoplasm of breast: Secondary | ICD-10-CM

## 2020-09-08 ENCOUNTER — Other Ambulatory Visit: Payer: Self-pay | Admitting: Internal Medicine

## 2020-09-08 DIAGNOSIS — E041 Nontoxic single thyroid nodule: Secondary | ICD-10-CM

## 2020-09-17 ENCOUNTER — Inpatient Hospital Stay: Payer: Medicare Other

## 2020-09-17 ENCOUNTER — Other Ambulatory Visit: Payer: Self-pay

## 2020-09-17 DIAGNOSIS — C8308 Small cell B-cell lymphoma, lymph nodes of multiple sites: Secondary | ICD-10-CM

## 2020-09-17 DIAGNOSIS — Z5112 Encounter for antineoplastic immunotherapy: Secondary | ICD-10-CM | POA: Diagnosis not present

## 2020-09-17 LAB — BASIC METABOLIC PANEL - CANCER CENTER ONLY
Anion gap: 9 (ref 5–15)
BUN: 16 mg/dL (ref 8–23)
CO2: 34 mmol/L — ABNORMAL HIGH (ref 22–32)
Calcium: 10.4 mg/dL — ABNORMAL HIGH (ref 8.9–10.3)
Chloride: 98 mmol/L (ref 98–111)
Creatinine: 0.74 mg/dL (ref 0.44–1.00)
GFR, Estimated: >60 mL/min (ref >60)
Glucose, Bld: 210 mg/dL — ABNORMAL HIGH (ref 70–99)
Potassium: 3.2 mmol/L — ABNORMAL LOW (ref 3.5–5.1)
Sodium: 141 mmol/L (ref 135–145)

## 2020-09-18 ENCOUNTER — Ambulatory Visit
Admission: RE | Admit: 2020-09-18 | Discharge: 2020-09-18 | Disposition: A | Discharge status: Home / Self Care | Payer: Medicare Other | Source: Ambulatory Visit | Attending: Internal Medicine | Admitting: Internal Medicine

## 2020-09-18 DIAGNOSIS — E041 Nontoxic single thyroid nodule: Secondary | ICD-10-CM

## 2020-10-15 ENCOUNTER — Ambulatory Visit
Admission: RE | Admit: 2020-10-15 | Discharge: 2020-10-15 | Disposition: A | Discharge status: Home / Self Care | Payer: Medicare Other | Source: Ambulatory Visit | Attending: Internal Medicine | Admitting: Internal Medicine

## 2020-10-15 ENCOUNTER — Other Ambulatory Visit: Payer: Self-pay

## 2020-10-15 DIAGNOSIS — Z1231 Encounter for screening mammogram for malignant neoplasm of breast: Secondary | ICD-10-CM

## 2020-11-06 ENCOUNTER — Inpatient Hospital Stay: Payer: Medicare Other | Admitting: Hematology & Oncology

## 2020-11-06 ENCOUNTER — Inpatient Hospital Stay: Payer: Medicare Other | Attending: Hematology & Oncology

## 2020-11-06 ENCOUNTER — Encounter: Payer: Self-pay | Admitting: Hematology & Oncology

## 2020-11-06 ENCOUNTER — Other Ambulatory Visit: Payer: Self-pay

## 2020-11-06 ENCOUNTER — Inpatient Hospital Stay: Payer: Medicare Other

## 2020-11-06 VITALS — BP 107/52 | HR 69

## 2020-11-06 VITALS — BP 155/53 | HR 83 | Temp 98.4°F | Resp 18 | Wt 173.0 lb

## 2020-11-06 DIAGNOSIS — Z79899 Other long term (current) drug therapy: Secondary | ICD-10-CM | POA: Insufficient documentation

## 2020-11-06 DIAGNOSIS — Z5112 Encounter for antineoplastic immunotherapy: Secondary | ICD-10-CM | POA: Diagnosis not present

## 2020-11-06 DIAGNOSIS — C8308 Small cell B-cell lymphoma, lymph nodes of multiple sites: Secondary | ICD-10-CM | POA: Insufficient documentation

## 2020-11-06 LAB — CMP (CANCER CENTER ONLY)
ALT: 35 U/L (ref 0–44)
AST: 28 U/L (ref 15–41)
Albumin: 4.2 g/dL (ref 3.5–5.0)
Alkaline Phosphatase: 63 U/L (ref 38–126)
Anion gap: 9 (ref 5–15)
BUN: 17 mg/dL (ref 8–23)
CO2: 36 mmol/L — ABNORMAL HIGH (ref 22–32)
Calcium: 10.1 mg/dL (ref 8.9–10.3)
Chloride: 93 mmol/L — ABNORMAL LOW (ref 98–111)
Creatinine: 0.85 mg/dL (ref 0.44–1.00)
GFR, Estimated: >60 mL/min (ref >60)
Glucose, Bld: 356 mg/dL — ABNORMAL HIGH (ref 70–99)
Potassium: 3 mmol/L — ABNORMAL LOW (ref 3.5–5.1)
Sodium: 138 mmol/L (ref 135–145)
Total Bilirubin: 1 mg/dL (ref 0.3–1.2)
Total Protein: 6.6 g/dL (ref 6.5–8.1)

## 2020-11-06 LAB — CBC WITH DIFFERENTIAL (CANCER CENTER ONLY)
Abs Immature Granulocytes: 0.02 10*3/uL (ref 0.00–0.07)
Basophils Absolute: 0 10*3/uL (ref 0.0–0.1)
Basophils Relative: 0 %
Eosinophils Absolute: 0.3 10*3/uL (ref 0.0–0.5)
Eosinophils Relative: 5 %
HCT: 38.2 % (ref 36.0–46.0)
Hemoglobin: 13 g/dL (ref 12.0–15.0)
Immature Granulocytes: 0 %
Lymphocytes Relative: 21 %
Lymphs Abs: 1.1 10*3/uL (ref 0.7–4.0)
MCH: 27.1 pg (ref 26.0–34.0)
MCHC: 34 g/dL (ref 30.0–36.0)
MCV: 79.6 fL — ABNORMAL LOW (ref 80.0–100.0)
Monocytes Absolute: 0.7 10*3/uL (ref 0.1–1.0)
Monocytes Relative: 12 %
Neutro Abs: 3.2 10*3/uL (ref 1.7–7.7)
Neutrophils Relative %: 62 %
Platelet Count: 208 10*3/uL (ref 150–400)
RBC: 4.8 MIL/uL (ref 3.87–5.11)
RDW: 13.1 % (ref 11.5–15.5)
WBC Count: 5.3 10*3/uL (ref 4.0–10.5)
nRBC: 0 % (ref 0.0–0.2)

## 2020-11-06 LAB — LACTATE DEHYDROGENASE: LDH: 155 U/L (ref 98–192)

## 2020-11-06 MED ORDER — SODIUM CHLORIDE 0.9 % IV SOLN
Freq: Once | INTRAVENOUS | Status: AC
  Filled 2020-11-06: qty 250

## 2020-11-06 MED ORDER — DIPHENHYDRAMINE HCL 25 MG PO CAPS
50.0000 mg | ORAL_CAPSULE | Freq: Once | ORAL | Status: AC
  Administered 2020-11-06: 50 mg via ORAL

## 2020-11-06 MED ORDER — SODIUM CHLORIDE 0.9 % IV SOLN
375.0000 mg/m2 | Freq: Once | INTRAVENOUS | Status: AC
  Administered 2020-11-06: 700 mg via INTRAVENOUS
  Filled 2020-11-06: qty 50

## 2020-11-06 MED ORDER — ACETAMINOPHEN 325 MG PO TABS
650.0000 mg | ORAL_TABLET | Freq: Once | ORAL | Status: AC
  Administered 2020-11-06: 650 mg via ORAL

## 2020-11-06 MED ORDER — ACETAMINOPHEN 325 MG PO TABS
ORAL_TABLET | ORAL | Status: AC
  Filled 2020-11-06: qty 2

## 2020-11-06 MED ORDER — DIPHENHYDRAMINE HCL 25 MG PO CAPS
ORAL_CAPSULE | ORAL | Status: AC
  Filled 2020-11-06: qty 2

## 2020-11-06 NOTE — Progress Notes (Signed)
Hematology and Oncology Follow Up Visit  Valerie Cline [id number] December 16, 1955 65 y.o. 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 is here today for follow-up and treatment.  She looks fantastic.  Everything is going well with her.  She had a good holiday season.  She was at home.  Her blood sugars are quite high today.  She will see her endocrinologist in a couple weeks.  She has had no problems with fever.  There is no cough or shortness of breath.  She has had no nausea or vomiting.  She has had no change in bowel or bladder habits.  She is not noted any swollen lymph nodes.  There is been no rashes.  She has had no leg swelling.  There is no neuropathy in her hands or feet.  Overall, her performance status is ECOG 1.    Medications:  Allergies as of 11/06/2020   No Known Allergies     Medication List       Accurate as of November 06, 2020 10:22 AM. If you have any questions, ask your nurse or doctor.        allopurinol 100 MG tablet Commonly known as: ZYLOPRIM TAKE 1 TABLET(100 MG) BY MOUTH DAILY   amLODipine 2.5 MG tablet Commonly known as: NORVASC Take 2.5 mg by mouth at bedtime.   aspirin EC 81 MG tablet Take 81 mg by mouth at bedtime.   benazepril 20 MG tablet Commonly known as: LOTENSIN Take 20 mg by mouth at bedtime.   famciclovir 500 MG tablet Commonly known as: FAMVIR Take 1 tablet (500 mg total) by mouth daily.   HUMULIN R 500 UNIT/ML injection Generic drug: insulin regular human CONCENTRATED Inject 50-70 Units into the skin See admin instructions. Inject 70 units subcutaneously before breakfast, and 50 units before lunch and 70 for supper - drawn on a U-500 syringe   hydrochlorothiazide 25 MG tablet Commonly  known as: HYDRODIURIL Take 25 mg by mouth at bedtime.   potassium chloride SA 20 MEQ tablet Commonly known as: KLOR-CON Take 1 tablet (20 mEq total) by mouth daily.   rosuvastatin 10 MG tablet Commonly known as: CRESTOR Take 10 mg by mouth at bedtime.   Trulicity 1.5 TT/0.1XB Sopn Generic drug: Dulaglutide Inject 1.5 mg into the skin every Tuesday.       Allergies: No Known Allergies  Past Medical History, Surgical history, Social history, and Family History were reviewed and updated.  Review of Systems: Review of Systems  Constitutional: Negative.   HENT: Negative.   Eyes: Negative.   Respiratory: Negative.   Cardiovascular: Negative.   Gastrointestinal: Negative.   Genitourinary: Negative.   Musculoskeletal: Negative.   Skin: Negative.   Neurological: Negative.   Endo/Heme/Allergies: Negative.   Psychiatric/Behavioral: Negative.       Physical Exam:  weight is 173 lb (78.5 kg). Her oral temperature is 98.4 F (36.9 C). Her blood pressure is 155/53 (abnormal) and her pulse is 83. Her respiration is 18 and oxygen saturation is 96%.   Wt Readings from Last 3 Encounters:  11/06/20 173 lb (78.5 kg)  09/03/20 173 lb (78.5 kg)  09/01/20 173 lb 9.6 oz (78.7 kg)    Vital signs show temperature of  98.4.  Pulse 78.  Blood pressure 138/53.  Weight is 173 pounds.  Physical Exam Vitals reviewed.  HENT:     Head: Normocephalic and atraumatic.  Eyes:     Pupils: Pupils are equal, round, and reactive to light.  Cardiovascular:     Rate and Rhythm: Normal rate and regular rhythm.     Heart sounds: Normal heart sounds.  Pulmonary:     Effort: Pulmonary effort is normal.     Breath sounds: Normal breath sounds.  Abdominal:     General: Bowel sounds are normal.     Palpations: Abdomen is soft.  Musculoskeletal:        General: No tenderness or deformity. Normal range of motion.     Cervical back: Normal range of motion.  Lymphadenopathy:     Cervical: No cervical  adenopathy.  Skin:    General: Skin is warm and dry.     Findings: No erythema or rash.  Neurological:     Mental Status: She is alert and oriented to person, place, and time.  Psychiatric:        Behavior: Behavior normal.        Thought Content: Thought content normal.        Judgment: Judgment normal.      Lab Results  Component Value Date   WBC 5.3 11/06/2020   HGB 13.0 11/06/2020   HCT 38.2 11/06/2020   MCV 79.6 (L) 11/06/2020   PLT 208 11/06/2020   Lab Results  Component Value Date   FERRITIN 305 11/11/2019   Lab Results  Component Value Date   RBC 4.80 11/06/2020   Lab Results  Component Value Date   KPAFRELGTCHN 131.9 (H) 12/30/2019   LAMBDASER 29.8 (H) 12/30/2019   KAPLAMBRATIO 4.43 (H) 12/30/2019   Lab Results  Component Value Date   IGGSERUM 694 04/09/2020   IGA 109 04/09/2020   IGMSERUM 199 04/09/2020   Lab Results  Component Value Date   TOTALPROTELP 6.6 12/30/2019   ALBUMINELP 3.8 12/30/2019   A1GS 0.2 12/30/2019   A2GS 0.7 12/30/2019   BETS 0.9 12/30/2019   GAMS 1.0 12/30/2019   MSPIKE Not Observed 12/30/2019     Chemistry      Component Value Date/Time   NA 138 11/06/2020 0909   K 3.0 (L) 11/06/2020 0909   CL 93 (L) 11/06/2020 0909   CO2 36 (H) 11/06/2020 0909   BUN 17 11/06/2020 0909   CREATININE 0.85 11/06/2020 0909      Component Value Date/Time   CALCIUM 10.1 11/06/2020 0909   ALKPHOS 63 11/06/2020 0909   AST 28 11/06/2020 0909   ALT 35 11/06/2020 0909   BILITOT 1.0 11/06/2020 0909       Impression and Plan: [name] is a very pleasant 64 yo caucasian female with small lymphocytic B cell non-Hodgkin's lymphoma.  She is tolerating treatment nicely so far and will proceed with cycle 5/6 today as planned.   She is on maintenance Rituxan.  This will be her second cycle.  Her potassium is a little bit on the lower side.  I will going give her a dose of potassium while she is here.  We will plan to get her back in another  couple months for her third cycle of maintenance Rituxan.   Volanda Napoleon, MD 1/7/202210:22 AM

## 2020-11-06 NOTE — Progress Notes (Signed)
Pt discharged in no apparent distress. Pt left ambulatory without assistance. Pt aware of discharge instructions and verbalized understanding and had no further questions.  

## 2020-11-06 NOTE — Patient Instructions (Signed)
Rituximab injection What is this medicine? RITUXIMAB (ri TUX i mab) is a monoclonal antibody. It is used to treat certain types of cancer like non-Hodgkin lymphoma and chronic lymphocytic leukemia. It is also used to treat rheumatoid arthritis, granulomatosis with polyangiitis (or Wegener's granulomatosis), microscopic polyangiitis, and pemphigus vulgaris. This medicine may be used for other purposes; ask your health care provider or pharmacist if you have questions. COMMON BRAND NAME(S): Rituxan, RUXIENCE What should I tell my health care provider before I take this medicine? They need to know if you have any of these conditions:  heart disease  infection (especially a virus infection such as hepatitis B, chickenpox, cold sores, or herpes)  immune system problems  irregular heartbeat  kidney disease  low blood counts, like low white cell, platelet, or red cell counts  lung or breathing disease, like asthma  recently received or scheduled to receive a vaccine  an unusual or allergic reaction to rituximab, other medicines, foods, dyes, or preservatives  pregnant or trying to get pregnant  breast-feeding How should I use this medicine? This medicine is for infusion into a vein. It is administered in a hospital or clinic by a specially trained health care professional. A special MedGuide will be given to you by the pharmacist with each prescription and refill. Be sure to read this information carefully each time. Talk to your pediatrician regarding the use of this medicine in children. This medicine is not approved for use in children. Overdosage: If you think you have taken too much of this medicine contact a poison control center or emergency room at once. NOTE: This medicine is only for you. Do not share this medicine with others. What if I miss a dose? It is important not to miss a dose. Call your doctor or health care professional if you are unable to keep an appointment. What  may interact with this medicine?  cisplatin  live virus vaccines This list may not describe all possible interactions. Give your health care provider a list of all the medicines, herbs, non-prescription drugs, or dietary supplements you use. Also tell them if you smoke, drink alcohol, or use illegal drugs. Some items may interact with your medicine. What should I watch for while using this medicine? Your condition will be monitored carefully while you are receiving this medicine. You may need blood work done while you are taking this medicine. This medicine can cause serious allergic reactions. To reduce your risk you may need to take medicine before treatment with this medicine. Take your medicine as directed. In some patients, this medicine may cause a serious brain infection that may cause death. If you have any problems seeing, thinking, speaking, walking, or standing, tell your healthcare professional right away. If you cannot reach your healthcare professional, urgently seek other source of medical care. Call your doctor or health care professional for advice if you get a fever, chills or sore throat, or other symptoms of a cold or flu. Do not treat yourself. This drug decreases your body's ability to fight infections. Try to avoid being around people who are sick. Do not become pregnant while taking this medicine or for at least 12 months after stopping it. Women should inform their doctor if they wish to become pregnant or think they might be pregnant. There is a potential for serious side effects to an unborn child. Talk to your health care professional or pharmacist for more information. Do not breast-feed an infant while taking this medicine or for at   least 6 months after stopping it. What side effects may I notice from receiving this medicine? Side effects that you should report to your doctor or health care professional as soon as possible:  allergic reactions like skin rash, itching or  hives; swelling of the face, lips, or tongue  breathing problems  chest pain  changes in vision  diarrhea  headache with fever, neck stiffness, sensitivity to light, nausea, or confusion  fast, irregular heartbeat  loss of memory  low blood counts - this medicine may decrease the number of white blood cells, red blood cells and platelets. You may be at increased risk for infections and bleeding.  mouth sores  problems with balance, talking, or walking  redness, blistering, peeling or loosening of the skin, including inside the mouth  signs of infection - fever or chills, cough, sore throat, pain or difficulty passing urine  signs and symptoms of kidney injury like trouble passing urine or change in the amount of urine  signs and symptoms of liver injury like dark yellow or brown urine; general ill feeling or flu-like symptoms; light-colored stools; loss of appetite; nausea; right upper belly pain; unusually weak or tired; yellowing of the eyes or skin  signs and symptoms of low blood pressure like dizziness; feeling faint or lightheaded, falls; unusually weak or tired  stomach pain  swelling of the ankles, feet, hands  unusual bleeding or bruising  vomiting Side effects that usually do not require medical attention (report to your doctor or health care professional if they continue or are bothersome):  headache  joint pain  muscle cramps or muscle pain  nausea  tiredness This list may not describe all possible side effects. Call your doctor for medical advice about side effects. You may report side effects to FDA at 1-800-FDA-1088. Where should I keep my medicine? This drug is given in a hospital or clinic and will not be stored at home. NOTE: This sheet is a summary. It may not cover all possible information. If you have questions about this medicine, talk to your doctor, pharmacist, or health care provider.  2020 [employer]vier/Gold Standard (2018-11-28  22:01:36)  

## 2020-11-07 LAB — IGG, IGA, IGM
IgA: 104 mg/dL (ref 87–352)
IgG (Immunoglobin G), Serum: 650 mg/dL (ref 586–1602)
IgM (Immunoglobulin M), Srm: 172 mg/dL (ref 26–217)

## 2020-11-24 ENCOUNTER — Other Ambulatory Visit: Payer: Self-pay | Admitting: *Deleted

## 2020-11-24 MED ORDER — FAMCICLOVIR 500 MG PO TABS: 500.0000 mg | ORAL_TABLET | Freq: Every day | ORAL | 11 refills | Status: DC

## 2021-01-04 ENCOUNTER — Inpatient Hospital Stay: Payer: Medicare Other | Attending: Hematology & Oncology

## 2021-01-04 ENCOUNTER — Inpatient Hospital Stay: Payer: Medicare Other

## 2021-01-04 ENCOUNTER — Inpatient Hospital Stay: Payer: Medicare Other | Admitting: Hematology & Oncology

## 2021-01-04 ENCOUNTER — Other Ambulatory Visit: Payer: Self-pay

## 2021-01-04 ENCOUNTER — Encounter: Payer: Self-pay | Admitting: Hematology & Oncology

## 2021-01-04 VITALS — BP 157/53 | HR 76 | Temp 98.4°F | Resp 20 | Wt 176.0 lb

## 2021-01-04 DIAGNOSIS — Z79899 Other long term (current) drug therapy: Secondary | ICD-10-CM | POA: Diagnosis not present

## 2021-01-04 DIAGNOSIS — C8308 Small cell B-cell lymphoma, lymph nodes of multiple sites: Secondary | ICD-10-CM

## 2021-01-04 DIAGNOSIS — Z5112 Encounter for antineoplastic immunotherapy: Secondary | ICD-10-CM | POA: Insufficient documentation

## 2021-01-04 LAB — CMP (CANCER CENTER ONLY)
ALT: 29 U/L (ref 0–44)
AST: 24 U/L (ref 15–41)
Albumin: 4.4 g/dL (ref 3.5–5.0)
Alkaline Phosphatase: 83 U/L (ref 38–126)
Anion gap: 7 (ref 5–15)
BUN: 12 mg/dL (ref 8–23)
CO2: 38 mmol/L — ABNORMAL HIGH (ref 22–32)
Calcium: 9.9 mg/dL (ref 8.9–10.3)
Chloride: 96 mmol/L — ABNORMAL LOW (ref 98–111)
Creatinine: 0.77 mg/dL (ref 0.44–1.00)
GFR, Estimated: >60 mL/min (ref >60)
Glucose, Bld: 183 mg/dL — ABNORMAL HIGH (ref 70–99)
Potassium: 3 mmol/L — ABNORMAL LOW (ref 3.5–5.1)
Sodium: 141 mmol/L (ref 135–145)
Total Bilirubin: 0.8 mg/dL (ref 0.3–1.2)
Total Protein: 6.7 g/dL (ref 6.5–8.1)

## 2021-01-04 LAB — CBC WITH DIFFERENTIAL (CANCER CENTER ONLY)
Abs Immature Granulocytes: 0.06 10*3/uL (ref 0.00–0.07)
Basophils Absolute: 0 10*3/uL (ref 0.0–0.1)
Basophils Relative: 0 %
Eosinophils Absolute: 0.3 10*3/uL (ref 0.0–0.5)
Eosinophils Relative: 4 %
HCT: 38.1 % (ref 36.0–46.0)
Hemoglobin: 12.8 g/dL (ref 12.0–15.0)
Immature Granulocytes: 1 %
Lymphocytes Relative: 22 %
Lymphs Abs: 1.3 10*3/uL (ref 0.7–4.0)
MCH: 26.4 pg (ref 26.0–34.0)
MCHC: 33.6 g/dL (ref 30.0–36.0)
MCV: 78.6 fL — ABNORMAL LOW (ref 80.0–100.0)
Monocytes Absolute: 0.8 10*3/uL (ref 0.1–1.0)
Monocytes Relative: 13 %
Neutro Abs: 3.5 10*3/uL (ref 1.7–7.7)
Neutrophils Relative %: 60 %
Platelet Count: 193 10*3/uL (ref 150–400)
RBC: 4.85 MIL/uL (ref 3.87–5.11)
RDW: 13.3 % (ref 11.5–15.5)
WBC Count: 6 10*3/uL (ref 4.0–10.5)
nRBC: 0 % (ref 0.0–0.2)

## 2021-01-04 LAB — LACTATE DEHYDROGENASE: LDH: 147 U/L (ref 98–192)

## 2021-01-04 MED ORDER — SODIUM CHLORIDE 0.9% FLUSH
10.0000 mL | INTRAVENOUS | Status: DC | PRN
  Filled 2021-01-04: qty 10

## 2021-01-04 MED ORDER — SODIUM CHLORIDE 0.9 % IV SOLN
375.0000 mg/m2 | Freq: Once | INTRAVENOUS | Status: AC
  Administered 2021-01-04: 700 mg via INTRAVENOUS
  Filled 2021-01-04: qty 50

## 2021-01-04 MED ORDER — DIPHENHYDRAMINE HCL 25 MG PO CAPS
50.0000 mg | ORAL_CAPSULE | Freq: Once | ORAL | Status: AC
  Administered 2021-01-04: 50 mg via ORAL

## 2021-01-04 MED ORDER — ACETAMINOPHEN 325 MG PO TABS
650.0000 mg | ORAL_TABLET | Freq: Once | ORAL | Status: AC
  Administered 2021-01-04: 650 mg via ORAL

## 2021-01-04 MED ORDER — DIPHENHYDRAMINE HCL 25 MG PO CAPS
ORAL_CAPSULE | ORAL | Status: AC
  Filled 2021-01-04: qty 2

## 2021-01-04 MED ORDER — ACETAMINOPHEN 325 MG PO TABS
ORAL_TABLET | ORAL | Status: AC
  Filled 2021-01-04: qty 2

## 2021-01-04 MED ORDER — HEPARIN SOD (PORK) LOCK FLUSH 100 UNIT/ML IV SOLN
500.0000 [IU] | Freq: Once | INTRAVENOUS | Status: DC | PRN
  Filled 2021-01-04: qty 5

## 2021-01-04 MED ORDER — SODIUM CHLORIDE 0.9 % IV SOLN
Freq: Once | INTRAVENOUS | Status: AC
  Filled 2021-01-04: qty 250

## 2021-01-04 NOTE — Progress Notes (Signed)
Hematology and Oncology Follow Up Visit  Valerie Cline [id number] October 11, 1956 65 y.o. 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  Her main problem has been her diabetes.  She is at her last hemoglobin A1c was 8.2.  She has had no problems otherwise since we last saw her 2 months ago.  She had a relatively uneventful winter.  Unfortunately, she had to bury her father over the weekend.  He had a stroke.  He was 66 years old.  I do feel bad for her.  She has had no problems with fever.  There is been no issues with the coronavirus.  She has had no nausea or vomiting.  Is been no abdominal pain.  She has had no change in bowel or bladder habits.  Currently, her performance status is ECOG 1.    Medications:  Allergies as of 01/04/2021   No Known Allergies     Medication List       Accurate as of January 04, 2021  8:58 AM. If you have any questions, ask your nurse or doctor.        STOP taking these medications   aspirin EC 81 MG tablet Stopped by: Volanda Napoleon, MD     TAKE these medications   allopurinol 100 MG tablet Commonly known as: ZYLOPRIM TAKE 1 TABLET(100 MG) BY MOUTH DAILY   amLODipine 2.5 MG tablet Commonly known as: NORVASC Take 2.5 mg by mouth at bedtime.   benazepril 20 MG tablet Commonly known as: LOTENSIN Take 20 mg by mouth at bedtime.   Dulaglutide 1.5 MG/0.5ML Sopn Inject 1.5 mg into the skin every Tuesday.   famciclovir 500 MG tablet Commonly known as: FAMVIR Take 1 tablet (500 mg total) by mouth daily.   Farxiga 5 MG Tabs tablet Generic drug: dapagliflozin propanediol Take 5 mg by mouth daily.   hydrochlorothiazide 25 MG tablet Commonly known as:  HYDRODIURIL Take 25 mg by mouth at bedtime.   insulin regular human CONCENTRATED 500 UNIT/ML injection Commonly known as: HUMULIN R Inject 50-70 Units into the skin See admin instructions. 01/04/2021 Inject 80 units subcutaneously before breakfast, and 50 units before lunch and 90 for supper - drawn on a U-500 syringe   potassium chloride SA 20 MEQ tablet Commonly known as: KLOR-CON Take 1 tablet (20 mEq total) by mouth daily.   rosuvastatin 10 MG tablet Commonly known as: CRESTOR Take 10 mg by mouth at bedtime.       Allergies: No Known Allergies  Past Medical History, Surgical history, Social history, and Family History were reviewed and updated.  Review of Systems: Review of Systems  Constitutional: Negative.   HENT: Negative.   Eyes: Negative.   Respiratory: Negative.   Cardiovascular: Negative.   Gastrointestinal: Negative.   Genitourinary: Negative.   Musculoskeletal: Negative.   Skin: Negative.   Neurological: Negative.   Endo/Heme/Allergies: Negative.   Psychiatric/Behavioral: Negative.       Physical Exam:  weight is 176 lb (79.8 kg). Her oral temperature is 98.4 F (36.9 C). Her blood pressure is 157/53 (abnormal) and her pulse is 76. Her respiration is 20 and oxygen saturation is 96%.   Wt Readings from  Last 3 Encounters:  01/04/21 176 lb (79.8 kg)  11/06/20 173 lb (78.5 kg)  09/03/20 173 lb (78.5 kg)    Vital signs show temperature of 98.4.  Pulse 78.  Blood pressure 138/53.  Weight is 173 pounds.  Physical Exam Vitals reviewed.  HENT:     Head: Normocephalic and atraumatic.  Eyes:     Pupils: Pupils are equal, round, and reactive to light.  Cardiovascular:     Rate and Rhythm: Normal rate and regular rhythm.     Heart sounds: Normal heart sounds.  Pulmonary:     Effort: Pulmonary effort is normal.     Breath sounds: Normal breath sounds.  Abdominal:     General: Bowel sounds are normal.     Palpations: Abdomen is soft.  Musculoskeletal:         General: No tenderness or deformity. Normal range of motion.     Cervical back: Normal range of motion.  Lymphadenopathy:     Cervical: No cervical adenopathy.  Skin:    General: Skin is warm and dry.     Findings: No erythema or rash.  Neurological:     Mental Status: She is alert and oriented to person, place, and time.  Psychiatric:        Behavior: Behavior normal.        Thought Content: Thought content normal.        Judgment: Judgment normal.      Lab Results  Component Value Date   WBC 6.0 01/04/2021   HGB 12.8 01/04/2021   HCT 38.1 01/04/2021   MCV 78.6 (L) 01/04/2021   PLT 193 01/04/2021   Lab Results  Component Value Date   FERRITIN 305 11/11/2019   Lab Results  Component Value Date   RBC 4.85 01/04/2021   Lab Results  Component Value Date   KPAFRELGTCHN 131.9 (H) 12/30/2019   LAMBDASER 29.8 (H) 12/30/2019   KAPLAMBRATIO 4.43 (H) 12/30/2019   Lab Results  Component Value Date   IGGSERUM 650 11/06/2020   IGA 104 11/06/2020   IGMSERUM 172 11/06/2020   Lab Results  Component Value Date   TOTALPROTELP 6.6 12/30/2019   ALBUMINELP 3.8 12/30/2019   A1GS 0.2 12/30/2019   A2GS 0.7 12/30/2019   BETS 0.9 12/30/2019   GAMS 1.0 12/30/2019   MSPIKE Not Observed 12/30/2019     Chemistry      Component Value Date/Time   NA 138 11/06/2020 0909   K 3.0 (L) 11/06/2020 0909   CL 93 (L) 11/06/2020 0909   CO2 36 (H) 11/06/2020 0909   BUN 17 11/06/2020 0909   CREATININE 0.85 11/06/2020 0909      Component Value Date/Time   CALCIUM 10.1 11/06/2020 0909   ALKPHOS 63 11/06/2020 0909   AST 28 11/06/2020 0909   ALT 35 11/06/2020 0909   BILITOT 1.0 11/06/2020 0909       Impression and Plan: Valerie Cline is a very pleasant 65 yo caucasian female with small lymphocytic B cell non-Hodgkin's lymphoma.  She completed her chemotherapy already.  She had 6 cycles of Rituxan/bendamustine.  She completed this on 07/10/2020.  She is on maintenance Rituxan.  She is  doing well with maintenance Rituxan.  I do not see need for any scans on her.  I cannot find anything on her physical exam that would suggest recurrence of the lymphoma.  We will plan for another follow-up in 2 months.  We will proceed with her third cycle of maintenance Rituxan today.  Volanda Napoleon, MD 3/7/20228:58 AM

## 2021-01-04 NOTE — Addendum Note (Signed)
Addended by: Burney Gauze R on: 01/04/2021 09:30 AM   Modules accepted: Orders

## 2021-01-04 NOTE — Patient Instructions (Signed)
Rituximab Injection What is this medicine? RITUXIMAB (ri TUX i mab) is a monoclonal antibody. It is used to treat certain types of cancer like non-Hodgkin lymphoma and chronic lymphocytic leukemia. It is also used to treat rheumatoid arthritis, granulomatosis with polyangiitis, microscopic polyangiitis, and pemphigus vulgaris. This medicine may be used for other purposes; ask your health care provider or pharmacist if you have questions. COMMON BRAND NAME(S): RIABNI, Rituxan, RUXIENCE What should I tell my health care provider before I take this medicine? They need to know if you have any of these conditions:  chest pain  heart disease  infection especially a viral infection such as chickenpox, cold sores, hepatitis B, or herpes  immune system problems  irregular heartbeat or rhythm  kidney disease  low blood counts (white cells, platelets, or red cells)  lung disease  recent or upcoming vaccine  an unusual or allergic reaction to rituximab, other medicines, foods, dyes, or preservatives  pregnant or trying to get pregnant  breast-feeding How should I use this medicine? This medicine is injected into a vein. It is given by a health care provider in a hospital or clinic setting. A special MedGuide will be given to you before each treatment. Be sure to read this information carefully each time. Talk to your health care provider about the use of this medicine in children. While this drug may be prescribed for children as young as 2 years for selected conditions, precautions do apply. Overdosage: If you think you have taken too much of this medicine contact a poison control center or emergency room at once. NOTE: This medicine is only for you. Do not share this medicine with others. What if I miss a dose? Keep appointments for follow-up doses. It is important not to miss your dose. Call your health care provider if you are unable to keep an appointment. What may interact with this  medicine? Do not take this medicine with any of the following medicines:  live vaccines This medicine may also interact with the following medicines:  cisplatin This list may not describe all possible interactions. Give your health care provider a list of all the medicines, herbs, non-prescription drugs, or dietary supplements you use. Also tell them if you smoke, drink alcohol, or use illegal drugs. Some items may interact with your medicine. What should I watch for while using this medicine? Your condition will be monitored carefully while you are receiving this medicine. You may need blood work done while you are taking this medicine. This medicine can cause serious infusion reactions. To reduce the risk your health care provider may give you other medicines to take before receiving this one. Be sure to follow the directions from your health care provider. This medicine may increase your risk of getting an infection. Call your health care provider for advice if you get a fever, chills, sore throat, or other symptoms of a cold or flu. Do not treat yourself. Try to avoid being around people who are sick. Call your health care provider if you are around anyone with measles, chickenpox, or if you develop sores or blisters that do not heal properly. Avoid taking medicines that contain aspirin, acetaminophen, ibuprofen, naproxen, or ketoprofen unless instructed by your health care provider. These medicines may hide a fever. This medicine may cause serious skin reactions. They can happen weeks to months after starting the medicine. Contact your health care provider right away if you notice fevers or flu-like symptoms with a rash. The rash may be red   or purple and then turn into blisters or peeling of the skin. Or, you might notice a red rash with swelling of the face, lips or lymph nodes in your neck or under your arms. In some patients, this medicine may cause a serious brain infection that may cause  death. If you have any problems seeing, thinking, speaking, walking, or standing, tell your healthcare professional right away. If you cannot reach your healthcare professional, urgently seek other source of medical care. Do not become pregnant while taking this medicine or for at least 12 months after stopping it. Women should inform their health care provider if they wish to become pregnant or think they might be pregnant. There is potential for serious harm to an unborn child. Talk to your health care provider for more information. Women should use a reliable form of birth control while taking this medicine and for 12 months after stopping it. Do not breast-feed while taking this medicine or for at least 6 months after stopping it. What side effects may I notice from receiving this medicine? Side effects that you should report to your health care provider as soon as possible:  allergic reactions (skin rash, itching or hives; swelling of the face, lips, or tongue)  diarrhea  edema (sudden weight gain; swelling of the ankles, feet, hands or other unusual swelling; trouble breathing)  fast, irregular heartbeat  heart attack (trouble breathing; pain or tightness in the chest, neck, back or arms; unusually weak or tired)  infection (fever, chills, cough, sore throat, pain or trouble passing urine)  kidney injury (trouble passing urine or change in the amount of urine)  liver injury (dark yellow or brown urine; general ill feeling or flu-like symptoms; loss of appetite, right upper belly pain; unusually weak or tired, yellowing of the eyes or skin)  low blood pressure (dizziness; feeling faint or lightheaded, falls; unusually weak or tired)  low red blood cell counts (trouble breathing; feeling faint; lightheaded, falls; unusually weak or tired)  mouth sores  redness, blistering, peeling, or loosening of the skin, including inside the mouth  stomach pain  unusual bruising or  bleeding  wheezing (trouble breathing with loud or whistling sounds)  vomiting Side effects that usually do not require medical attention (report to your health care provider if they continue or are bothersome):  headache  joint pain  muscle cramps, pain  nausea This list may not describe all possible side effects. Call your doctor for medical advice about side effects. You may report side effects to FDA at 1-800-FDA-1088. Where should I keep my medicine? This medicine is given in a hospital or clinic. It will not be stored at home. NOTE: This sheet is a summary. It may not cover all possible information. If you have questions about this medicine, talk to your doctor, pharmacist, or health care provider.  2021 Elsevier/Gold Standard (2020-07-30 21:35:50)

## 2021-03-04 ENCOUNTER — Telehealth: Payer: Self-pay

## 2021-03-04 ENCOUNTER — Telehealth: Payer: Self-pay | Admitting: *Deleted

## 2021-03-04 NOTE — Telephone Encounter (Signed)
Call transferred from jamie to r/s her 5/6 appts as she is not feeling well and to r/s for about 2weeks, done   Valerie Cline

## 2021-03-04 NOTE — Telephone Encounter (Signed)
Patient had low grade fever yesterday with headache, congestion and cough. She felt better today but saw her PCP. She is covid and flu negative. Her PCP stated she had a virus. She is currently scheduled for MD appointment and maintenance rituxan.   Spoke to Dr Marin Olp. He would like patient to be pushed out two weeks due to illness. Patient is aware and transferred to scheduling.

## 2021-03-05 ENCOUNTER — Inpatient Hospital Stay: Payer: Medicare Other | Admitting: Hematology & Oncology

## 2021-03-05 ENCOUNTER — Inpatient Hospital Stay: Payer: Medicare Other

## 2021-03-09 ENCOUNTER — Other Ambulatory Visit: Payer: Self-pay

## 2021-03-09 DIAGNOSIS — Z794 Long term (current) use of insulin: Secondary | ICD-10-CM

## 2021-03-09 DIAGNOSIS — E1165 Type 2 diabetes mellitus with hyperglycemia: Secondary | ICD-10-CM | POA: Insufficient documentation

## 2021-03-09 HISTORY — DX: Long term (current) use of insulin: Z79.4

## 2021-03-09 HISTORY — DX: Type 2 diabetes mellitus with hyperglycemia: E11.65

## 2021-03-16 ENCOUNTER — Other Ambulatory Visit: Payer: Self-pay

## 2021-03-16 ENCOUNTER — Ambulatory Visit: Payer: Medicare Other | Admitting: Cardiology

## 2021-03-16 ENCOUNTER — Encounter: Payer: Self-pay | Admitting: Cardiology

## 2021-03-16 VITALS — BP 148/68 | HR 72 | Ht 66.0 in | Wt 177.8 lb

## 2021-03-16 DIAGNOSIS — Z86010 Personal history of colonic polyps: Secondary | ICD-10-CM | POA: Insufficient documentation

## 2021-03-16 DIAGNOSIS — E041 Nontoxic single thyroid nodule: Secondary | ICD-10-CM

## 2021-03-16 DIAGNOSIS — C9110 Chronic lymphocytic leukemia of B-cell type not having achieved remission: Secondary | ICD-10-CM

## 2021-03-16 DIAGNOSIS — E781 Pure hyperglyceridemia: Secondary | ICD-10-CM | POA: Insufficient documentation

## 2021-03-16 DIAGNOSIS — E782 Mixed hyperlipidemia: Secondary | ICD-10-CM | POA: Diagnosis not present

## 2021-03-16 DIAGNOSIS — I2510 Atherosclerotic heart disease of native coronary artery without angina pectoris: Secondary | ICD-10-CM | POA: Diagnosis not present

## 2021-03-16 DIAGNOSIS — I10 Essential (primary) hypertension: Secondary | ICD-10-CM | POA: Diagnosis not present

## 2021-03-16 DIAGNOSIS — E1142 Type 2 diabetes mellitus with diabetic polyneuropathy: Secondary | ICD-10-CM

## 2021-03-16 DIAGNOSIS — Z860101 Personal history of adenomatous and serrated colon polyps: Secondary | ICD-10-CM

## 2021-03-16 HISTORY — DX: Nontoxic single thyroid nodule: E04.1

## 2021-03-16 HISTORY — DX: Pure hyperglyceridemia: E78.1

## 2021-03-16 HISTORY — DX: Personal history of adenomatous and serrated colon polyps: Z86.0101

## 2021-03-16 HISTORY — DX: Personal history of colonic polyps: Z86.010

## 2021-03-16 MED ORDER — FISH OIL 1000 MG PO CAPS: 2.0000 | ORAL_CAPSULE | Freq: Two times a day (BID) | ORAL | 12 refills | Status: DC

## 2021-03-16 NOTE — Patient Instructions (Signed)
Medication Instructions:  Your physician has recommended you make the following change in your medication:   Start 2 gm fish oil twice daily  *If you need a refill on your cardiac medications before your next appointment, please call your pharmacy*   Lab Work: None ordered If you have labs (blood work) drawn today and your tests are completely normal, you will receive your results only by: Marland Kitchen MyChart Message (if you have MyChart) OR . A paper copy in the mail If you have any lab test that is abnormal or we need to change your treatment, we will call you to review the results.   Testing/Procedures: None ordered   Follow-Up: At [facility], you and your health needs are our priority.  As part of our continuing mission to provide you with exceptional heart care, we have created designated Provider Care Teams.  These Care Teams include your primary Cardiologist (physician) and Advanced Practice Providers (APPs -  Physician Assistants and Nurse Practitioners) who all work together to provide you with the care you need, when you need it.  We recommend signing up for the patient portal called "MyChart".  Sign up information is provided on this After Visit Summary.  MyChart is used to connect with patients for Virtual Visits (Telemedicine).  Patients are able to view lab/test results, encounter notes, upcoming appointments, etc.  Non-urgent messages can be sent to your provider as well.   To learn more about what you can do with MyChart, go to [email or web address].    Your next appointment:   6 month(s)  The format for your next appointment:   In Person  Provider:   Jyl Heinz, MD   Other Instructions Fish Oil, Omega-3 Fatty Acids capsules (OTC) What is this medicine? FISH OIL, OMEGA-3 FATTY ACIDS (Fish Oil, oh MAY ga - 3 fatty AS ids) are essential fats. It is promoted to help support a healthy heart. This dietary supplement is used to add to a healthy diet. The FDA has not  approved this supplement for any medical use. This supplement may be used for other purposes; ask your health care provider or pharmacist if you have questions. This medicine may be used for other purposes; ask your health care provider or pharmacist if you have questions. COMMON BRAND NAME(S): Omega-3, Omega-3 Fish Oil, OMEGA-3 IQ DHA, TherOmega, THEROMEGA SPORT What should I tell my health care provider before I take this medicine? They need to know if you have any of these conditions  bleeding problems  lung or breathing disease, like asthma  an unusual or allergic reaction to fish oil, omega-3 fatty acids, fish, other medicines, foods, dyes, or preservatives  pregnant or trying to get pregnant  breast-feeding How should I use this medicine? Take this medicine by mouth with a glass of water. Follow the directions on the package or prescription label. Take with food. Take your medicine at regular intervals. Do not take your medicine more often than directed. Talk to your pediatrician regarding the use of this medicine in children. Special care may be needed. This medicine should not be used in children without a doctor's advice. Overdosage: If you think you have taken too much of this medicine contact a poison control center or emergency room at once. NOTE: This medicine is only for you. Do not share this medicine with others. What if I miss a dose? If you miss a dose, take it as soon as you can. If it is almost time for your next dose, take  only that dose. Do not take double or extra doses. What may interact with this medicine?  aspirin and aspirin-like medicines  herbal products like danshen, dong quai, garlic pills, ginger, ginkgo biloba, horse chestnut, willow bark, and others  medicines that treat or prevent blood clots like enoxaparin, heparin, warfarin This list may not describe all possible interactions. Give your health care provider a list of all the medicines, herbs,  non-prescription drugs, or dietary supplements you use. Also tell them if you smoke, drink alcohol, or use illegal drugs. Some items may interact with your medicine. What should I watch for while using this medicine? Follow a good diet and exercise plan. Taking a dietary supplement does not replace a healthy lifestyle. Some foods that have omega-3 fatty acids naturally are fatty fish like albacore tuna, halibut, herring, mackerel, lake trout, salmon, and sardines. Too much of this supplement can be unsafe. Talk to your doctor or health care provider about how much of this supplement is right for you. If you are scheduled for any medical or dental procedure, tell your healthcare provider that you are taking this medicine. You may need to stop taking this medicine before the procedure. Herbal or dietary supplements are not regulated like medicines. Rigid quality control standards are not required for dietary supplements. The purity and strength of these products can vary. The safety and effect of this dietary supplement for a certain disease or illness is not well known. This product is not intended to diagnose, treat, cure or prevent any disease. The Food and Drug Administration suggests the following to help consumers protect themselves:  Always read product labels and follow directions.  Natural does not mean a product is safe for humans to take.  Look for products that include USP after the ingredient name. This means that the manufacturer followed the standards of the [place] Pharmacopoeia.  Supplements made or sold by a nationally known food or drug company are more likely to be made under tight controls. You can write to the company for more information about how the product was made. What side effects may I notice from receiving this medicine? Side effects that you should report to your doctor or health care professional as soon as possible:  allergic reactions like skin rash, itching or hives,  swelling of the face, lips, or tongue  breathing problems  changes in your moods or emotions  unusual bleeding or bruising Side effects that usually do not require medical attention (report to your doctor or health care professional if they continue or are bothersome):  bad or fishy breath  belching  diarrhea  nausea  stomach gas, upset  weight gain This list may not describe all possible side effects. Call your doctor for medical advice about side effects. You may report side effects to FDA at 1-800-FDA-1088. Where should I keep my medicine? Keep out of the reach of children. Store at room temperature or as directed on the package label. Protect from moisture. Do not freeze. Throw away any unused medicine after the expiration date. NOTE: This sheet is a summary. It may not cover all possible information. If you have questions about this medicine, talk to your doctor, pharmacist, or health care provider.  2021 Elsevier/Gold Standard (2015-02-05 09:36:32)

## 2021-03-16 NOTE — Progress Notes (Signed)
Cardiology Office Note:    Date:  03/16/2021   ID:  Valerie Cline, DOB 1956/06/27, MRN [id number]  PCP:  Ginger Organ., MD  Cardiologist:  Jenean Lindau, MD   Referring MD: Ginger Organ., MD    ASSESSMENT:    1. Essential hypertension   2. Coronary artery calcification seen on CT scan   3. Mixed hyperlipidemia   4. CLL (chronic lymphocytic leukemia) ([place])   5. Diabetic peripheral neuropathy associated with type 2 diabetes mellitus ([place])    PLAN:    In order of problems listed above:  1. Coronary artery disease: Secondary prevention stressed to the patient.  Importance of compliance with diet medication stressed and she vocalized understanding.  I told her to walk at least half an hour a day 5 days a week and she promises to do so. 2. Mixed dyslipidemia: LDL is fine but lipids overall reveal elevated triglycerides and I cautioned the patient about this.  Diet was emphasized.  I told her to take over-the-counter fish oil 2 g twice daily and she promises to do so. 3. Diabetes mellitus: Uncontrolled.  In March her hemoglobin A1c was greater than 8 and again this was discussed with her.  Her primary care doctor is following this closely.  Exercise diet and medications and compliance was urged and she promises to do better. 4. Patient will be seen in follow-up appointment in 6 months or earlier if the patient has any concerns    Medication Adjustments/Labs and Tests Ordered: Current medicines are reviewed at length with the patient today.  Concerns regarding medicines are outlined above.  Orders Placed This Encounter  Procedures  . EKG 12-Lead   Meds ordered this encounter  Medications  . Omega-3 Fatty Acids (FISH OIL) 1000 MG CAPS    Sig: Take 2 capsules (2,000 mg total) by mouth 2 (two) times daily.    Dispense:  180 capsule    Refill:  12     No chief complaint on file.    History of Present Illness:    Valerie Cline is a 65 y.o. female.  Patient has  past medical history of coronary artery disease diagnosed by coronary artery calcifications on CT scanning, essential hypertension dyslipidemia and diabetes mellitus.  She denies any problems at this time and takes care of activities of daily living.  She also has chronic lymphocytic leukemia.  She walks on a regular basis.  At the time of my evaluation, the patient is alert awake oriented and in no distress.  Past Medical History:  Diagnosis Date  . Allergy   . Blood in stool   . Carotid stenosis   . Cervical lymphadenopathy 10/31/2019  . CLL (chronic lymphocytic leukemia) ([place]) 12/26/2019  . Coronary artery calcification seen on CT scan 06/01/2020  . Diabetes mellitus without complication ([place])   . Elevated liver function tests   . Essential hypertension 11/08/2019  . External hemorrhoids without mention of complication   . Goals of care, counseling/discussion 10/28/2019  . Hyperglycemia due to type 2 diabetes mellitus ([place]) 03/09/2021  . Hyperlipidemia   . Hypertension   . Long term (current) use of insulin ([place]) 03/09/2021  . Lymphadenopathy, abdominal 10/28/2019  . Mixed dyslipidemia 06/01/2020  . Pneumonia due to COVID-19 virus 11/08/2019  . 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 T2DM (type 2 diabetes mellitus) ([place]) 11/08/2019  . Type 2 diabetes mellitus with unspecified complications ([place]) 01/04/6282  Past Surgical History:  Procedure Laterality Date  . BREAST BIOPSY Right    needle biopsy  . COLONOSCOPY    . POLYPECTOMY    . Ruptured disc      Current Medications: Current Meds  Medication Sig  . amLODipine (NORVASC) 2.5 MG tablet Take 2.5 mg by mouth at bedtime.  . benazepril (LOTENSIN) 20 MG tablet Take 20 mg by mouth at bedtime.  . Dulaglutide (TRULICITY) 1.5 FA/2.1HY SOPN Inject 1.5 mg into the skin once a week.  . famciclovir (FAMVIR) 500 MG tablet Take 1 tablet (500 mg total) by mouth daily.  Marland Kitchen FARXIGA 5 MG TABS tablet Take 5 mg by  mouth daily.  . insulin regular human CONCENTRATED (HUMULIN R) 500 UNIT/ML injection Inject 50-70 Units into the skin daily.  Inject 80 units subcutaneously before breakfast, and 50 units before lunch and 90 for supper   . Omega-3 Fatty Acids (FISH OIL) 1000 MG CAPS Take 2 capsules (2,000 mg total) by mouth 2 (two) times daily.  . rosuvastatin (CRESTOR) 10 MG tablet Take 10 mg by mouth at bedtime.  . [DISCONTINUED] allopurinol (ZYLOPRIM) 100 MG tablet Take 100 mg by mouth daily.     Allergies:   Canagliflozin, Glucophage [metformin], and Niaspan [niacin]   Social History   Socioeconomic History  . Marital status: Married    Spouse name: Not on file  . Number of children: 1  . Years of education: Not on file  . Highest education level: Not on file  Occupational History  . Occupation: Engineer, maintenance (IT)  Tobacco Use  . Smoking status: Never Smoker  . Smokeless tobacco: Never Used  Vaping Use  . Vaping Use: Never used  Substance and Sexual Activity  . Alcohol use: No  . Drug use: No  . Sexual activity: Not on file  Other Topics Concern  . Not on file  Social History Narrative  . Not on file   Social Determinants of Health   Financial Resource Strain: Not on file  Food Insecurity: Not on file  Transportation Needs: Not on file  Physical Activity: Not on file  Stress: Not on file  Social Connections: Not on file     Family History: The patient's family history is negative for Colon cancer, Esophageal cancer, Rectal cancer, Stomach cancer, and Pancreatic cancer.  ROS:   Please see the history of present illness.    All other systems reviewed and are negative.  EKGs/Labs/Other Studies Reviewed:    The following studies were reviewed today: I reviewed blood work report and elevated triglycerides and hemoglobin A1c was significant discussed with her.   Recent Labs: 01/04/2021: ALT 29; BUN 12; Creatinine 0.77; Hemoglobin 12.8; Platelet Count 193; Potassium 3.0;  Sodium 141  Recent Lipid Panel    Component Value Date/Time   TRIG 222 (H) 11/08/2019 1804    Physical Exam:    VS:  BP (!) 148/68   Pulse 72   Ht 5\' 6"  (1.676 m)   Wt 177 lb 12.8 oz (80.6 kg)   SpO2 97%   BMI 28.70 kg/m     Wt Readings from Last 3 Encounters:  03/16/21 177 lb 12.8 oz (80.6 kg)  01/04/21 176 lb (79.8 kg)  11/06/20 173 lb (78.5 kg)     GEN: Patient is in no acute distress HEENT: Normal NECK: No JVD; No carotid bruits LYMPHATICS: No lymphadenopathy CARDIAC: Hear sounds regular, 2/6 systolic murmur at the apex. RESPIRATORY:  Clear to auscultation without rales, wheezing or rhonchi  ABDOMEN: Soft, non-tender, non-distended MUSCULOSKELETAL:  No edema; No deformity  SKIN: Warm and dry NEUROLOGIC:  Alert and oriented x 3 PSYCHIATRIC:  Normal affect   Signed, Jenean Lindau, MD  03/16/2021 12:04 PM    [facility]

## 2021-03-17 ENCOUNTER — Telehealth: Payer: Self-pay

## 2021-03-17 ENCOUNTER — Inpatient Hospital Stay (HOSPITAL_BASED_OUTPATIENT_CLINIC_OR_DEPARTMENT_OTHER): Payer: Medicare Other | Admitting: Hematology & Oncology

## 2021-03-17 ENCOUNTER — Inpatient Hospital Stay: Payer: Medicare Other | Attending: Hematology & Oncology

## 2021-03-17 ENCOUNTER — Encounter: Payer: Self-pay | Admitting: Hematology & Oncology

## 2021-03-17 ENCOUNTER — Inpatient Hospital Stay: Payer: Medicare Other

## 2021-03-17 VITALS — BP 151/51 | HR 74 | Temp 98.5°F | Resp 16 | Wt 178.0 lb

## 2021-03-17 VITALS — BP 128/64 | HR 66 | Resp 16

## 2021-03-17 DIAGNOSIS — C8308 Small cell B-cell lymphoma, lymph nodes of multiple sites: Secondary | ICD-10-CM | POA: Diagnosis not present

## 2021-03-17 DIAGNOSIS — Z79899 Other long term (current) drug therapy: Secondary | ICD-10-CM | POA: Insufficient documentation

## 2021-03-17 DIAGNOSIS — Z5112 Encounter for antineoplastic immunotherapy: Secondary | ICD-10-CM | POA: Diagnosis not present

## 2021-03-17 LAB — CBC WITH DIFFERENTIAL (CANCER CENTER ONLY)
Abs Immature Granulocytes: 0.01 10*3/uL (ref 0.00–0.07)
Basophils Absolute: 0 10*3/uL (ref 0.0–0.1)
Basophils Relative: 1 %
Eosinophils Absolute: 0.3 10*3/uL (ref 0.0–0.5)
Eosinophils Relative: 4 %
HCT: 38.8 % (ref 36.0–46.0)
Hemoglobin: 12.9 g/dL (ref 12.0–15.0)
Immature Granulocytes: 0 %
Lymphocytes Relative: 18 %
Lymphs Abs: 1.2 10*3/uL (ref 0.7–4.0)
MCH: 26 pg (ref 26.0–34.0)
MCHC: 33.2 g/dL (ref 30.0–36.0)
MCV: 78.2 fL — ABNORMAL LOW (ref 80.0–100.0)
Monocytes Absolute: 0.8 10*3/uL (ref 0.1–1.0)
Monocytes Relative: 12 %
Neutro Abs: 4.2 10*3/uL (ref 1.7–7.7)
Neutrophils Relative %: 65 %
Platelet Count: 209 10*3/uL (ref 150–400)
RBC: 4.96 MIL/uL (ref 3.87–5.11)
RDW: 13.2 % (ref 11.5–15.5)
WBC Count: 6.5 10*3/uL (ref 4.0–10.5)
nRBC: 0 % (ref 0.0–0.2)

## 2021-03-17 LAB — CMP (CANCER CENTER ONLY)
ALT: 25 U/L (ref 0–44)
AST: 22 U/L (ref 15–41)
Albumin: 4.3 g/dL (ref 3.5–5.0)
Alkaline Phosphatase: 84 U/L (ref 38–126)
Anion gap: 10 (ref 5–15)
BUN: 17 mg/dL (ref 8–23)
CO2: 32 mmol/L (ref 22–32)
Calcium: 9.8 mg/dL (ref 8.9–10.3)
Chloride: 99 mmol/L (ref 98–111)
Creatinine: 0.83 mg/dL (ref 0.44–1.00)
GFR, Estimated: >60 mL/min (ref >60)
Glucose, Bld: 151 mg/dL — ABNORMAL HIGH (ref 70–99)
Potassium: 3.2 mmol/L — ABNORMAL LOW (ref 3.5–5.1)
Sodium: 141 mmol/L (ref 135–145)
Total Bilirubin: 0.8 mg/dL (ref 0.3–1.2)
Total Protein: 6.8 g/dL (ref 6.5–8.1)

## 2021-03-17 LAB — LACTATE DEHYDROGENASE: LDH: 150 U/L (ref 98–192)

## 2021-03-17 MED ORDER — ACETAMINOPHEN 325 MG PO TABS
ORAL_TABLET | ORAL | Status: AC
  Filled 2021-03-17: qty 2

## 2021-03-17 MED ORDER — SODIUM CHLORIDE 0.9 % IV SOLN
Freq: Once | INTRAVENOUS | Status: AC
  Filled 2021-03-17: qty 250

## 2021-03-17 MED ORDER — SODIUM CHLORIDE 0.9 % IV SOLN
375.0000 mg/m2 | Freq: Once | INTRAVENOUS | Status: AC
  Administered 2021-03-17: 700 mg via INTRAVENOUS
  Filled 2021-03-17: qty 50

## 2021-03-17 MED ORDER — DIPHENHYDRAMINE HCL 25 MG PO CAPS
50.0000 mg | ORAL_CAPSULE | Freq: Once | ORAL | Status: AC
  Administered 2021-03-17: 50 mg via ORAL

## 2021-03-17 MED ORDER — ACETAMINOPHEN 325 MG PO TABS
650.0000 mg | ORAL_TABLET | Freq: Once | ORAL | Status: AC
Start: 2021-03-17 — End: 2021-03-17
  Administered 2021-03-17: 650 mg via ORAL

## 2021-03-17 MED ORDER — DIPHENHYDRAMINE HCL 25 MG PO CAPS
ORAL_CAPSULE | ORAL | Status: AC
  Filled 2021-03-17: qty 2

## 2021-03-17 NOTE — Patient Instructions (Addendum)
Rituximab Injection What is this medicine? RITUXIMAB (ri TUX i mab) is a monoclonal antibody. It is used to treat certain types of cancer like non-Hodgkin lymphoma and chronic lymphocytic leukemia. It is also used to treat rheumatoid arthritis, granulomatosis with polyangiitis, microscopic polyangiitis, and pemphigus vulgaris. This medicine may be used for other purposes; ask your health care provider or pharmacist if you have questions. COMMON BRAND NAME(S): RIABNI, Rituxan, RUXIENCE What should I tell my health care provider before I take this medicine? They need to know if you have any of these conditions:  chest pain  heart disease  infection especially a viral infection such as chickenpox, cold sores, hepatitis B, or herpes  immune system problems  irregular heartbeat or rhythm  kidney disease  low blood counts (white cells, platelets, or red cells)  lung disease  recent or upcoming vaccine  an unusual or allergic reaction to rituximab, other medicines, foods, dyes, or preservatives  pregnant or trying to get pregnant  breast-feeding How should I use this medicine? This medicine is injected into a vein. It is given by a health care provider in a hospital or clinic setting. A special MedGuide will be given to you before each treatment. Be sure to read this information carefully each time. Talk to your health care provider about the use of this medicine in children. While this drug may be prescribed for children as young as 2 years for selected conditions, precautions do apply. Overdosage: If you think you have taken too much of this medicine contact a poison control center or emergency room at once. NOTE: This medicine is only for you. Do not share this medicine with others. What if I miss a dose? Keep appointments for follow-up doses. It is important not to miss your dose. Call your health care provider if you are unable to keep an appointment. What may interact with this  medicine? Do not take this medicine with any of the following medicines:  live vaccines This medicine may also interact with the following medicines:  cisplatin This list may not describe all possible interactions. Give your health care provider a list of all the medicines, herbs, non-prescription drugs, or dietary supplements you use. Also tell them if you smoke, drink alcohol, or use illegal drugs. Some items may interact with your medicine. What should I watch for while using this medicine? Your condition will be monitored carefully while you are receiving this medicine. You may need blood work done while you are taking this medicine. This medicine can cause serious infusion reactions. To reduce the risk your health care provider may give you other medicines to take before receiving this one. Be sure to follow the directions from your health care provider. This medicine may increase your risk of getting an infection. Call your health care provider for advice if you get a fever, chills, sore throat, or other symptoms of a cold or flu. Do not treat yourself. Try to avoid being around people who are sick. Call your health care provider if you are around anyone with measles, chickenpox, or if you develop sores or blisters that do not heal properly. Avoid taking medicines that contain aspirin, acetaminophen, ibuprofen, naproxen, or ketoprofen unless instructed by your health care provider. These medicines may hide a fever. This medicine may cause serious skin reactions. They can happen weeks to months after starting the medicine. Contact your health care provider right away if you notice fevers or flu-like symptoms with a rash. The rash may be red   or purple and then turn into blisters or peeling of the skin. Or, you might notice a red rash with swelling of the face, lips or lymph nodes in your neck or under your arms. In some patients, this medicine may cause a serious brain infection that may cause  death. If you have any problems seeing, thinking, speaking, walking, or standing, tell your healthcare professional right away. If you cannot reach your healthcare professional, urgently seek other source of medical care. Do not become pregnant while taking this medicine or for at least 12 months after stopping it. Women should inform their health care provider if they wish to become pregnant or think they might be pregnant. There is potential for serious harm to an unborn child. Talk to your health care provider for more information. Women should use a reliable form of birth control while taking this medicine and for 12 months after stopping it. Do not breast-feed while taking this medicine or for at least 6 months after stopping it. What side effects may I notice from receiving this medicine? Side effects that you should report to your health care provider as soon as possible:  allergic reactions (skin rash, itching or hives; swelling of the face, lips, or tongue)  diarrhea  edema (sudden weight gain; swelling of the ankles, feet, hands or other unusual swelling; trouble breathing)  fast, irregular heartbeat  heart attack (trouble breathing; pain or tightness in the chest, neck, back or arms; unusually weak or tired)  infection (fever, chills, cough, sore throat, pain or trouble passing urine)  kidney injury (trouble passing urine or change in the amount of urine)  liver injury (dark yellow or brown urine; general ill feeling or flu-like symptoms; loss of appetite, right upper belly pain; unusually weak or tired, yellowing of the eyes or skin)  low blood pressure (dizziness; feeling faint or lightheaded, falls; unusually weak or tired)  low red blood cell counts (trouble breathing; feeling faint; lightheaded, falls; unusually weak or tired)  mouth sores  redness, blistering, peeling, or loosening of the skin, including inside the mouth  stomach pain  unusual bruising or  bleeding  wheezing (trouble breathing with loud or whistling sounds)  vomiting Side effects that usually do not require medical attention (report to your health care provider if they continue or are bothersome):  headache  joint pain  muscle cramps, pain  nausea This list may not describe all possible side effects. Call your doctor for medical advice about side effects. You may report side effects to FDA at 1-800-FDA-1088. Where should I keep my medicine? This medicine is given in a hospital or clinic. It will not be stored at home. NOTE: This sheet is a summary. It may not cover all possible information. If you have questions about this medicine, talk to your doctor, pharmacist, or health care provider.  2021 Elsevier/Gold Standard (2020-07-30 21:35:50) Valerie Cline [facility]  Discharge Instructions: Thank you for choosing [facility] to provide your oncology and hematology care.   If you have a lab appointment with the [facility], please go directly to the [facility] and check in at the registration area.  Wear comfortable clothing and clothing appropriate for easy access to any Portacath or PICC line.   We strive to give you quality time with your provider. You may need to reschedule your appointment if you arrive late (15 or more minutes).  Arriving late affects you and other patients whose appointments are after yours.  Also, if you miss three or more appointments without notifying the office, you may be dismissed from the clinic at the provider's discretion.      For prescription refill requests, have your pharmacy contact our office and allow 72 hours for refills to be completed.    Today you received the following chemotherapy and/or immunotherapy agents Rituxan   To help prevent nausea and vomiting after your treatment, we encourage you to take your nausea medication as directed.  BELOW ARE SYMPTOMS THAT SHOULD BE REPORTED  IMMEDIATELY: . *FEVER GREATER THAN 100.4 F (38 C) OR HIGHER . *CHILLS OR SWEATING . *NAUSEA AND VOMITING THAT IS NOT CONTROLLED WITH YOUR NAUSEA MEDICATION . *UNUSUAL SHORTNESS OF BREATH . *UNUSUAL BRUISING OR BLEEDING . *URINARY PROBLEMS (pain or burning when urinating, or frequent urination) . *BOWEL PROBLEMS (unusual diarrhea, constipation, pain near the anus) . TENDERNESS IN MOUTH AND THROAT WITH OR WITHOUT PRESENCE OF ULCERS (sore throat, sores in mouth, or a toothache) . UNUSUAL RASH, SWELLING OR PAIN  . UNUSUAL VAGINAL DISCHARGE OR ITCHING   Items with * indicate a potential emergency and should be followed up as soon as possible or go to the Emergency Department if any problems should occur.  Please show the CHEMOTHERAPY ALERT CARD or IMMUNOTHERAPY ALERT CARD at check-in to the Emergency Department and triage nurse. Should you have questions after your visit or need to cancel or reschedule your appointment, please contact [facility]  [phone] and follow the prompts.  Office hours are 8:00 a.m. to 4:30 p.m. Monday - Friday. Please note that voicemails left after 4:00 p.m. may not be returned until the following business day.  We are closed weekends and major holidays. You have access to a nurse at all times for urgent questions. Please call the main number to the clinic [phone] and follow the prompts.  For any non-urgent questions, you may also contact your provider using MyChart. We now offer e-Visits for anyone 67 and older to request care online for non-urgent symptoms. For details visit [email or web address].   Also download the MyChart app! Go to the app store, search "MyChart", open the app, select [facility], and log in with your MyChart username and password.  Due to Covid, a mask is required upon entering the hospital/clinic. If you do not have a mask, one will be given to you upon arrival. For doctor visits, patients may have 1 support  person aged 46 or older with them. For treatment visits, patients cannot have anyone with them due to current Covid guidelines and our immunocompromised population.

## 2021-03-17 NOTE — Telephone Encounter (Signed)
appts made per 03/17/21 los and pt will gain a new sch in tx/avs and through [employer]

## 2021-03-17 NOTE — Progress Notes (Signed)
Hematology and Oncology Follow Up Visit  Valerie Cline [id number] 01/08/1956 65 y.o. 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  We last saw her back in March.  She is been doing quite well.  She is watching her diabetes closely.  She has had no problems with fever.  Is been no issues with COVID.  She has had no problems with change in bowel or bladder habits.  There has been no rashes.  She has had no leg swelling.  There is been no tingling in the hands or feet.  She has had no problems with headache.  There is no visual changes.  Overall, her performance status is ECOG 1.    Medications:  Allergies as of 03/17/2021      Reactions   Canagliflozin    Other reaction(s): recurrent vaginitis   Glucophage [metformin]    Other reaction(s): GI intolerance   Niaspan [niacin]    Other reaction(s): flushing      Medication List       Accurate as of Mar 17, 2021  8:23 AM. If you have any questions, ask your nurse or doctor.        amLODipine 2.5 MG tablet Commonly known as: NORVASC Take 2.5 mg by mouth at bedtime.   benazepril 20 MG tablet Commonly known as: LOTENSIN Take 20 mg by mouth at bedtime.   famciclovir 500 MG tablet Commonly known as: FAMVIR Take 1 tablet (500 mg total) by mouth daily.   Farxiga 5 MG Tabs tablet Generic drug: dapagliflozin propanediol Take 5 mg by mouth daily.   Fish Oil 1000 MG Caps Take 2 capsules (2,000 mg total) by mouth 2 (two) times daily.   insulin regular human CONCENTRATED 500 UNIT/ML injection Commonly known as: HUMULIN R Inject 50-70 Units into the skin daily.  Inject 80 units subcutaneously before breakfast, and 50 units before lunch and 90  for supper   rosuvastatin 10 MG tablet Commonly known as: CRESTOR Take 10 mg by mouth at bedtime.   Trulicity 1.5 GL/8.7FI Sopn Generic drug: Dulaglutide Inject 1.5 mg into the skin once a week.       Allergies:  Allergies  Allergen Reactions  . Canagliflozin     Other reaction(s): recurrent vaginitis  . Glucophage [Metformin]     Other reaction(s): GI intolerance  . Niaspan [Niacin]     Other reaction(s): flushing    Past Medical History, Surgical history, Social history, and Family History were reviewed and updated.  Review of Systems: Review of Systems  Constitutional: Negative.   HENT: Negative.   Eyes: Negative.   Respiratory: Negative.   Cardiovascular: Negative.   Gastrointestinal: Negative.   Genitourinary: Negative.   Musculoskeletal: Negative.   Skin: Negative.   Neurological: Negative.   Endo/Heme/Allergies: Negative.   Psychiatric/Behavioral: Negative.       Physical Exam:  vitals were not taken for this visit.   Wt Readings from Last 3 Encounters:  03/16/21 177 lb 12.8 oz (80.6 kg)  01/04/21 176 lb (79.8 kg)  11/06/20 173 lb (78.5 kg)    Vital signs show temperature of 98.4.  Pulse 78.  Blood pressure 138/53.  Weight is 173 pounds.  Physical Exam Vitals reviewed.  HENT:     Head: Normocephalic and atraumatic.  Eyes:     Pupils: Pupils are equal, round, and reactive to light.  Cardiovascular:     Rate and Rhythm: Normal rate and regular rhythm.     Heart sounds: Normal heart sounds.  Pulmonary:     Effort: Pulmonary effort is normal.     Breath sounds: Normal breath sounds.  Abdominal:     General: Bowel sounds are normal.     Palpations: Abdomen is soft.  Musculoskeletal:        General: No tenderness or deformity. Normal range of motion.     Cervical back: Normal range of motion.  Lymphadenopathy:     Cervical: No cervical adenopathy.  Skin:    General: Skin is warm and dry.     Findings: No erythema or rash.  Neurological:      Mental Status: She is alert and oriented to person, place, and time.  Psychiatric:        Behavior: Behavior normal.        Thought Content: Thought content normal.        Judgment: Judgment normal.      Lab Results  Component Value Date   WBC 6.5 03/17/2021   HGB 12.9 03/17/2021   HCT 38.8 03/17/2021   MCV 78.2 (L) 03/17/2021   PLT 209 03/17/2021   Lab Results  Component Value Date   FERRITIN 305 11/11/2019   Lab Results  Component Value Date   RBC 4.96 03/17/2021   Lab Results  Component Value Date   KPAFRELGTCHN 131.9 (H) 12/30/2019   LAMBDASER 29.8 (H) 12/30/2019   KAPLAMBRATIO 4.43 (H) 12/30/2019   Lab Results  Component Value Date   IGGSERUM 650 11/06/2020   IGA 104 11/06/2020   IGMSERUM 172 11/06/2020   Lab Results  Component Value Date   TOTALPROTELP 6.6 12/30/2019   ALBUMINELP 3.8 12/30/2019   A1GS 0.2 12/30/2019   A2GS 0.7 12/30/2019   BETS 0.9 12/30/2019   GAMS 1.0 12/30/2019   MSPIKE Not Observed 12/30/2019     Chemistry      Component Value Date/Time   NA 141 01/04/2021 0827   K 3.0 (L) 01/04/2021 0827   CL 96 (L) 01/04/2021 0827   CO2 38 (H) 01/04/2021 0827   BUN 12 01/04/2021 0827   CREATININE 0.77 01/04/2021 0827      Component Value Date/Time   CALCIUM 9.9 01/04/2021 0827   ALKPHOS 83 01/04/2021 0827   AST 24 01/04/2021 0827   ALT 29 01/04/2021 0827   BILITOT 0.8 01/04/2021 0827       Impression and Plan: Valerie Cline is a very pleasant 64 yo caucasian female with small lymphocytic B cell non-Hodgkin's lymphoma.  She completed her chemotherapy .  She had 6 cycles of Rituxan/bendamustine.  She completed this on 07/10/2020.  She is on maintenance Rituxan.  She is doing well with maintenance Rituxan.  This will be her fourth treatment of maintenance Rituxan.  Think we can try to get her through most of the summer now.  She and her family will go on a beach trip July 4 weekend.  I am so happy that she can do this.  I told her to wear  a lot of sunscreen and drink a lot of water.  I do not see need for any scans on her.  I cannot find anything on her physical exam that would suggest recurrence of the lymphoma.  Volanda Napoleon, MD 5/18/20228:23 AM

## 2021-04-13 ENCOUNTER — Telehealth: Payer: Self-pay

## 2021-04-13 NOTE — Telephone Encounter (Signed)
S/w pt and she is aware of her appt change aas dr e is out of the office on 06/16/21   Valerie Cline

## 2021-05-21 IMAGING — CT CT NECK W/ CM
4 of 7 series · 11 of 33 positions shown, 12 images · IV contrast (Omnipaque)
Comparison: PET scan 11/25/2019.

CLINICAL DATA: Bcell lymphoma.  Lymphadenopathy.  Follow-up.

EXAM:
CT NECK WITH CONTRAST
TECHNIQUE: Multidetector CT imaging of the neck was performed using the
standard protocol following the bolus administration of intravenous
contrast.
CONTRAST:  100mL OMNIPAQUE IOHEXOL 300 MG/ML  SOLN

[Series 10: sagittals · sagittal · 0.68mm/px · 5 of 206 slices shown]
[im 52/206  bone]
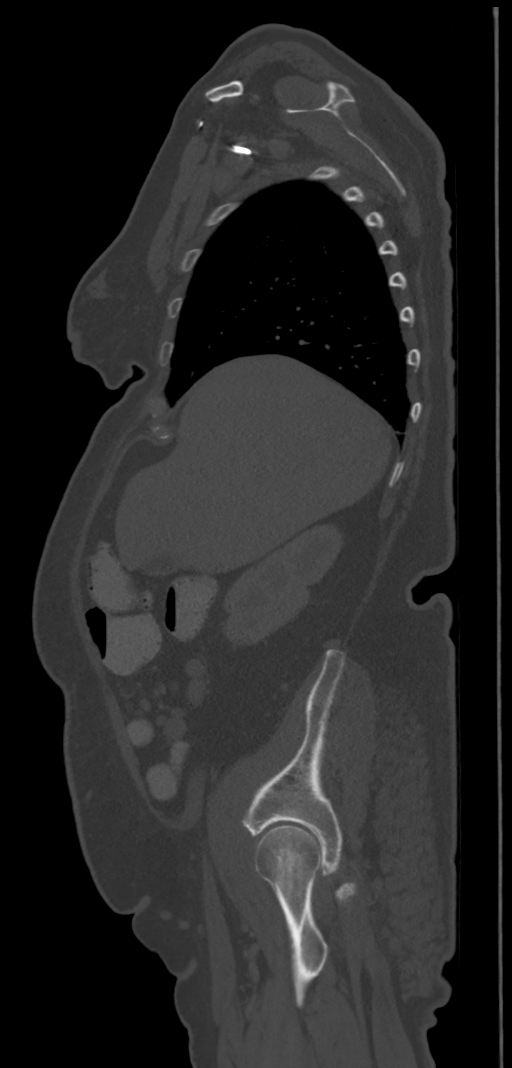
[im 77/206  bone]
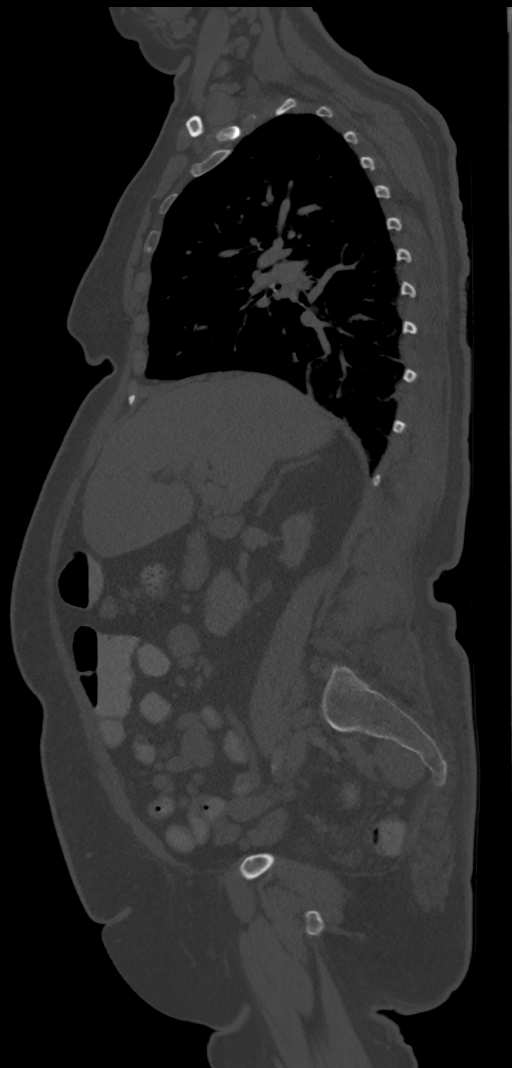
[im 103/206  bone]
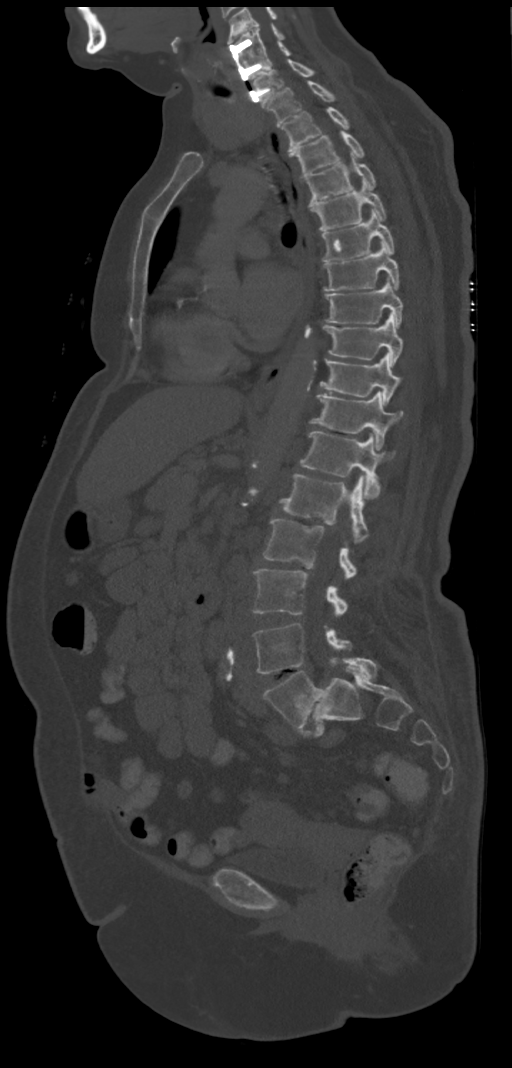
[im 129/206  bone]
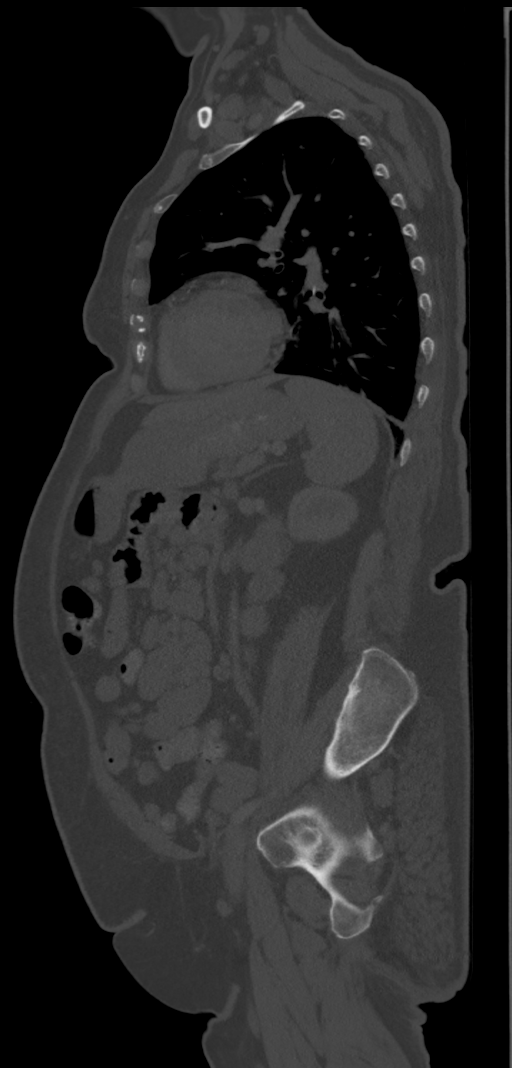
[im 154/206  bone]
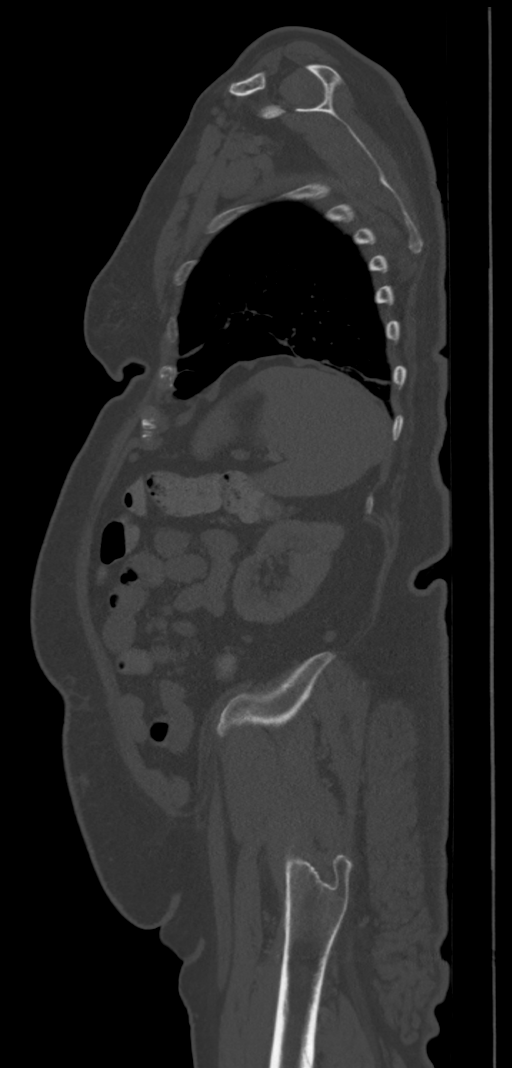

[Series 11: axial neck · axial · 0.48mm/px · z∈[-195,-111]mm · 2 of 127 slices shown, 3 images]
[im 43/127  soft-tissue]
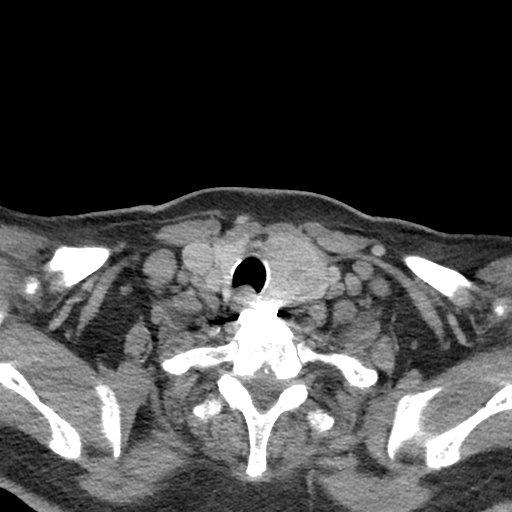
[im 43/127  bone]
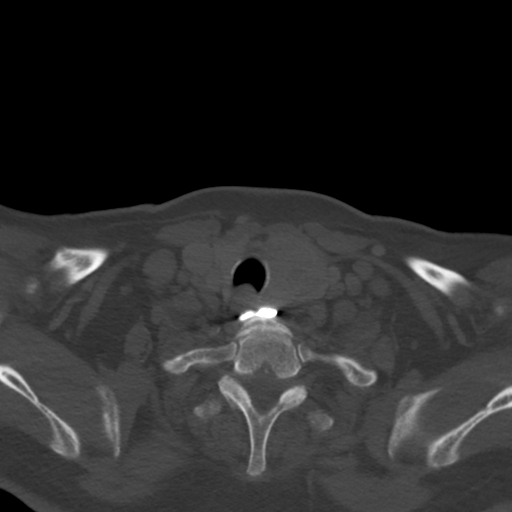
[im 85/127  bone]
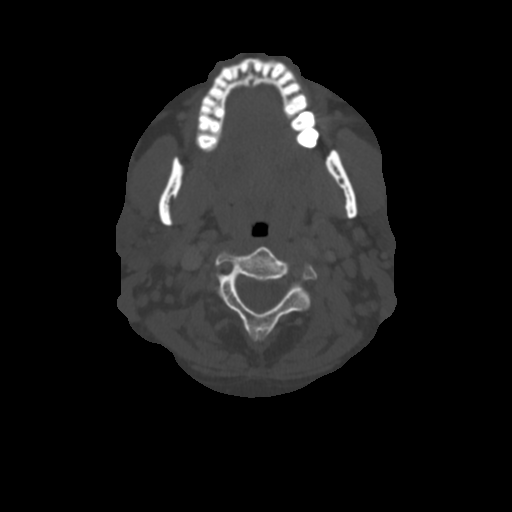

[Series 15: cor neck · coronal · 0.39mm/px · 3 of 136 slices shown]
[im 28/136  bone]
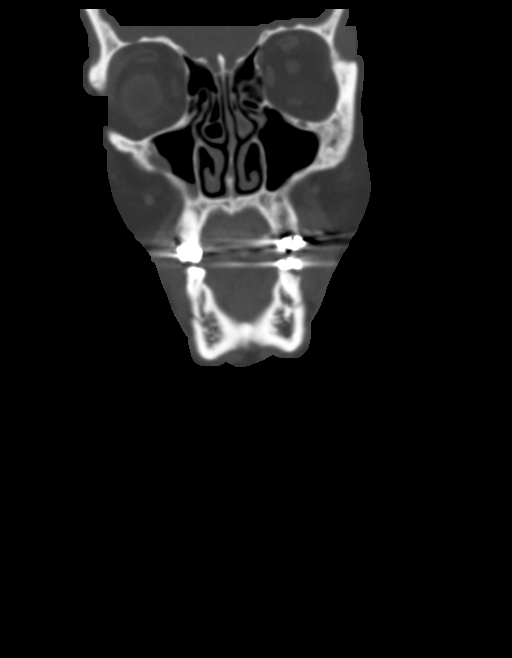
[im 55/136  bone]
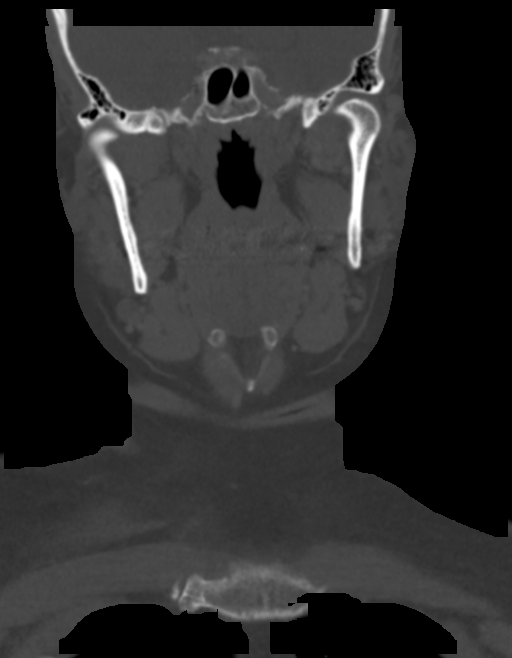
[im 82/136  bone]
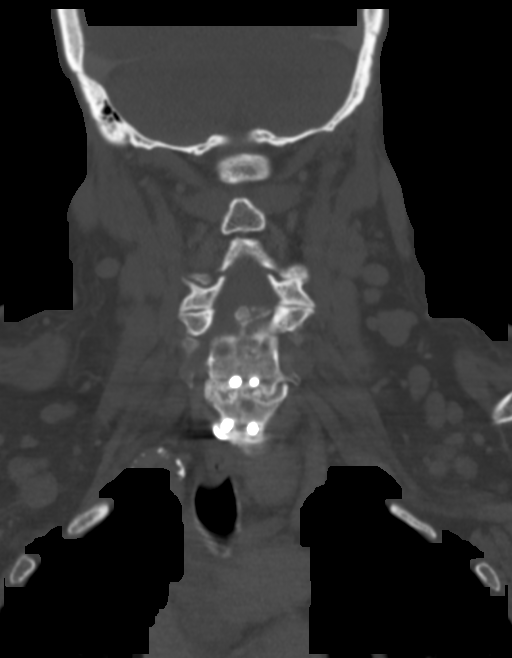

[Series 16: orthogonal ax · axial · 0.39mm/px · 1 of 138 slices shown]
[im 46/138  bone]
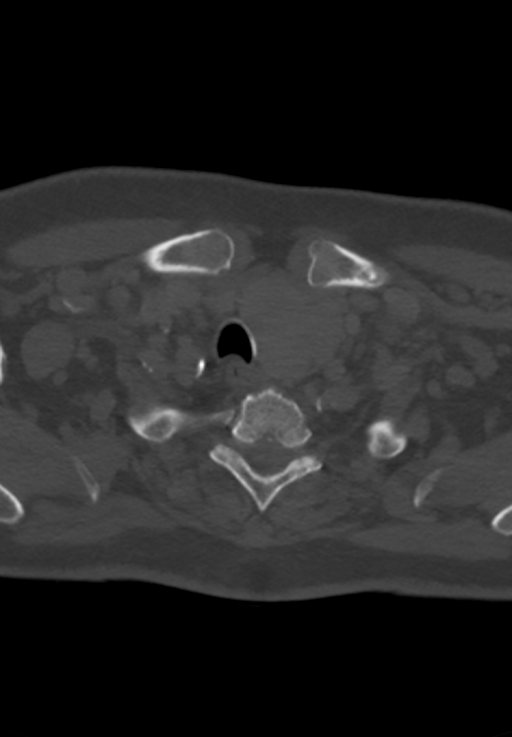

[11 of 33 positions shown; findings below may reference images not displayed]

FINDINGS: Pharynx and larynx: No mucosal or submucosal lesion.

Salivary glands: Parotid and submandibular glands are normal.

Thyroid: Chronic thyroid goiter, unchanged since previous studies.

Lymph nodes: Worsening of bilateral lymphadenopathy at all nodal
stations. Index nodes are as follows. Right level 2 node image 52
short axis dimension 14 mm was 11 mm. Right level 2 node image 59
measures 13 mm was 10 mm. Right level 4 node image 74 measures 12 mm
was 7 mm. Right supraclavicular node image 90 measures 20 mm was 12
mm.

On the left, level 3 node image 51 measures 7 mm was 5 mm.
Supraclavicular node image 62 measures 9 mm was 6 mm.
Supraclavicular node image 83 measures 10 mm was 6 mm.

Vascular: Ordinary carotid bifurcation calcification.

Limited intracranial: Normal

Visualized orbits: Normal

Mastoids and visualized paranasal sinuses: Clear

Skeleton: Previous ACDF.  Degenerative changes above and below that.

Upper chest: See results of chest CT.

Other: None
IMPRESSION: Enlargement of innumerable lymph nodes at all nodal stations
throughout both sides of the neck. Many nodes show approximate
doubling. Index nodes measured as above.

## 2021-06-16 ENCOUNTER — Other Ambulatory Visit: Payer: Medicare Other

## 2021-06-16 ENCOUNTER — Ambulatory Visit: Payer: Medicare Other | Admitting: Hematology & Oncology

## 2021-06-16 ENCOUNTER — Ambulatory Visit: Payer: Medicare Other

## 2021-06-17 ENCOUNTER — Inpatient Hospital Stay: Payer: Medicare Other | Admitting: Hematology & Oncology

## 2021-06-17 ENCOUNTER — Inpatient Hospital Stay: Payer: Medicare Other | Attending: Hematology & Oncology

## 2021-06-17 ENCOUNTER — Inpatient Hospital Stay: Payer: Medicare Other

## 2021-06-17 ENCOUNTER — Encounter: Payer: Self-pay | Admitting: Hematology & Oncology

## 2021-06-17 ENCOUNTER — Other Ambulatory Visit: Payer: Self-pay

## 2021-06-17 ENCOUNTER — Telehealth: Payer: Self-pay

## 2021-06-17 VITALS — BP 141/62 | HR 82 | Temp 98.4°F | Resp 16 | Wt 180.0 lb

## 2021-06-17 VITALS — BP 121/59 | HR 73 | Temp 98.8°F | Resp 17

## 2021-06-17 DIAGNOSIS — C8308 Small cell B-cell lymphoma, lymph nodes of multiple sites: Secondary | ICD-10-CM | POA: Diagnosis not present

## 2021-06-17 DIAGNOSIS — Z79899 Other long term (current) drug therapy: Secondary | ICD-10-CM | POA: Insufficient documentation

## 2021-06-17 DIAGNOSIS — Z5112 Encounter for antineoplastic immunotherapy: Secondary | ICD-10-CM | POA: Diagnosis not present

## 2021-06-17 LAB — CBC WITH DIFFERENTIAL (CANCER CENTER ONLY)
Abs Immature Granulocytes: 0.02 10*3/uL (ref 0.00–0.07)
Basophils Absolute: 0 10*3/uL (ref 0.0–0.1)
Basophils Relative: 0 %
Eosinophils Absolute: 0.3 10*3/uL (ref 0.0–0.5)
Eosinophils Relative: 4 %
HCT: 40 % (ref 36.0–46.0)
Hemoglobin: 13.5 g/dL (ref 12.0–15.0)
Immature Granulocytes: 0 %
Lymphocytes Relative: 19 %
Lymphs Abs: 1.2 10*3/uL (ref 0.7–4.0)
MCH: 26.3 pg (ref 26.0–34.0)
MCHC: 33.8 g/dL (ref 30.0–36.0)
MCV: 77.8 fL — ABNORMAL LOW (ref 80.0–100.0)
Monocytes Absolute: 0.7 10*3/uL (ref 0.1–1.0)
Monocytes Relative: 10 %
Neutro Abs: 4.2 10*3/uL (ref 1.7–7.7)
Neutrophils Relative %: 67 %
Platelet Count: 207 10*3/uL (ref 150–400)
RBC: 5.14 MIL/uL — ABNORMAL HIGH (ref 3.87–5.11)
RDW: 13.7 % (ref 11.5–15.5)
WBC Count: 6.4 10*3/uL (ref 4.0–10.5)
nRBC: 0 % (ref 0.0–0.2)

## 2021-06-17 LAB — CMP (CANCER CENTER ONLY)
ALT: 41 U/L (ref 0–44)
AST: 37 U/L (ref 15–41)
Albumin: 4.1 g/dL (ref 3.5–5.0)
Alkaline Phosphatase: 96 U/L (ref 38–126)
Anion gap: 11 (ref 5–15)
BUN: 16 mg/dL (ref 8–23)
CO2: 32 mmol/L (ref 22–32)
Calcium: 9.6 mg/dL (ref 8.9–10.3)
Chloride: 95 mmol/L — ABNORMAL LOW (ref 98–111)
Creatinine: 0.88 mg/dL (ref 0.44–1.00)
GFR, Estimated: >60 mL/min (ref >60)
Glucose, Bld: 257 mg/dL — ABNORMAL HIGH (ref 70–99)
Potassium: 3.1 mmol/L — ABNORMAL LOW (ref 3.5–5.1)
Sodium: 138 mmol/L (ref 135–145)
Total Bilirubin: 0.9 mg/dL (ref 0.3–1.2)
Total Protein: 6.8 g/dL (ref 6.5–8.1)

## 2021-06-17 LAB — LACTATE DEHYDROGENASE: LDH: 163 U/L (ref 98–192)

## 2021-06-17 MED ORDER — SODIUM CHLORIDE 0.9 % IV SOLN: Freq: Once | INTRAVENOUS | Status: AC

## 2021-06-17 MED ORDER — SODIUM CHLORIDE 0.9 % IV SOLN
375.0000 mg/m2 | Freq: Once | INTRAVENOUS | Status: AC
  Administered 2021-06-17: 700 mg via INTRAVENOUS
  Filled 2021-06-17: qty 50

## 2021-06-17 MED ORDER — ACETAMINOPHEN 325 MG PO TABS
650.0000 mg | ORAL_TABLET | Freq: Once | ORAL | Status: AC
  Administered 2021-06-17: 650 mg via ORAL
  Filled 2021-06-17: qty 2

## 2021-06-17 MED ORDER — DIPHENHYDRAMINE HCL 25 MG PO CAPS
50.0000 mg | ORAL_CAPSULE | Freq: Once | ORAL | Status: AC
  Administered 2021-06-17: 50 mg via ORAL
  Filled 2021-06-17: qty 2

## 2021-06-17 NOTE — Progress Notes (Signed)
Hematology and Oncology Follow Up Visit  Valerie Cline [id number] [birth year] 65 y.o. 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  She had a wonderful time since we last saw her.  She and her [place] the beach [place] the [place] weekend.  That a wonderful time.  Her blood sugars are still on the higher side.  She is trying to control these.  She has had no problems with swollen lymph nodes.  She has had no fatigue or weakness.  She has had no change in bowel or bladder habits.  She has had no rashes.  There has been no leg swelling.  She has had no issues with COVID.  She has had her boosters.  There is been no headache.  She has had no neuropathy.  She has had lower back issues.  It does sound like she has spinal stenosis.  Her family doctor is helping out with this.  Overall, her performance status is ECOG 1.    Medications:  Allergies as of 06/17/2021       Reactions   Canagliflozin    Other reaction(s): recurrent vaginitis   Glucophage [metformin]    Other reaction(s): GI intolerance   Niaspan [niacin]    Other reaction(s): flushing        Medication List        Accurate as of June 17, 2021  9:28 AM. If you have any questions, ask your nurse or doctor.          amLODipine 2.5 MG tablet Commonly known as: NORVASC Take 2.5 mg by mouth at bedtime.   benazepril 20 MG tablet Commonly known as: LOTENSIN Take 20 mg by mouth at bedtime.   famciclovir 500 MG tablet Commonly known as: FAMVIR Take 1 tablet (500 mg total) by mouth daily.   Farxiga 5 MG Tabs tablet Generic drug: dapagliflozin propanediol Take 5 mg by mouth daily.   Fish Oil 1000 MG Caps Take 2  capsules (2,000 mg total) by mouth 2 (two) times daily.   insulin regular human CONCENTRATED 500 UNIT/ML injection Commonly known as: HUMULIN R Inject 50-70 Units into the skin daily.  Inject 80 units subcutaneously before breakfast, and 50 units before lunch and 90 for supper   Potassium 75 MG Tabs Take 300 mg by mouth daily.   rosuvastatin 10 MG tablet Commonly known as: CRESTOR Take 10 mg by mouth at bedtime.   Trulicity 1.5 ZJ/6.9CV Sopn Generic drug: Dulaglutide Inject 1.5 mg into the skin once a week.        Allergies:  Allergies  Allergen Reactions   Canagliflozin     Other reaction(s): recurrent vaginitis   Glucophage [Metformin]     Other reaction(s): GI intolerance   Niaspan [Niacin]     Other reaction(s): flushing    Past Medical History, Surgical history, Social history, and Family History were reviewed and updated.  Review of Systems: Review of Systems  Constitutional: Negative.   HENT: Negative.    Eyes: Negative.   Respiratory: Negative.    Cardiovascular: Negative.   Gastrointestinal: Negative.   Genitourinary: Negative.   Musculoskeletal: Negative.   Skin: Negative.   Neurological:  Negative.   Endo/Heme/Allergies: Negative.   Psychiatric/Behavioral: Negative.       Physical Exam:  vitals were not taken for this visit.   Wt Readings from Last 3 Encounters:  03/17/21 178 lb (80.7 kg)  03/16/21 177 lb 12.8 oz (80.6 kg)  01/04/21 176 lb (79.8 kg)    Vital signs show temperature of 98.4.  Pulse 78.  Blood pressure 138/53.  Weight is 173 pounds.  Physical Exam Vitals reviewed.  HENT:     Head: Normocephalic and atraumatic.  Eyes:     Pupils: Pupils are equal, round, and reactive to light.  Cardiovascular:     Rate and Rhythm: Normal rate and regular rhythm.     Heart sounds: Normal heart sounds.  Pulmonary:     Effort: Pulmonary effort is normal.     Breath sounds: Normal breath sounds.  Abdominal:     General: Bowel sounds are  normal.     Palpations: Abdomen is soft.  Musculoskeletal:        General: No tenderness or deformity. Normal range of motion.     Cervical back: Normal range of motion.  Lymphadenopathy:     Cervical: No cervical adenopathy.  Skin:    General: Skin is warm and dry.     Findings: No erythema or rash.  Neurological:     Mental Status: She is alert and oriented to person, place, and time.  Psychiatric:        Behavior: Behavior normal.        Thought Content: Thought content normal.        Judgment: Judgment normal.     Lab Results  Component Value Date   WBC 6.4 06/17/2021   HGB 13.5 06/17/2021   HCT 40.0 06/17/2021   MCV 77.8 (L) 06/17/2021   PLT 207 06/17/2021   Lab Results  Component Value Date   FERRITIN 305 11/11/2019   Lab Results  Component Value Date   RBC 5.14 (H) 06/17/2021   Lab Results  Component Value Date   KPAFRELGTCHN 131.9 (H) 12/30/2019   LAMBDASER 29.8 (H) 12/30/2019   KAPLAMBRATIO 4.43 (H) 12/30/2019   Lab Results  Component Value Date   IGGSERUM 650 11/06/2020   IGA 104 11/06/2020   IGMSERUM 172 11/06/2020   Lab Results  Component Value Date   TOTALPROTELP 6.6 12/30/2019   ALBUMINELP 3.8 12/30/2019   A1GS 0.2 12/30/2019   A2GS 0.7 12/30/2019   BETS 0.9 12/30/2019   GAMS 1.0 12/30/2019   MSPIKE Not Observed 12/30/2019     Chemistry      Component Value Date/Time   NA 138 06/17/2021 0842   K 3.1 (L) 06/17/2021 0842   CL 95 (L) 06/17/2021 0842   CO2 32 06/17/2021 0842   BUN 16 06/17/2021 0842   CREATININE 0.88 06/17/2021 0842      Component Value Date/Time   CALCIUM 9.6 06/17/2021 0842   ALKPHOS 96 06/17/2021 0842   AST 37 06/17/2021 0842   ALT 41 06/17/2021 0842   BILITOT 0.9 06/17/2021 0842       Impression and Plan: Valerie Cline is a very pleasant 65 yo caucasian female with small lymphocytic B cell non-Hodgkin's lymphoma.  She completed her chemotherapy .  She had 6 cycles of Rituxan/bendamustine.  She completed this on  07/10/2020.  She is on maintenance Rituxan.  She is doing well with maintenance Rituxan.  This will be her 5th treatment of maintenance Rituxan.  I really wish that her blood sugars  would be a little bit better.  Again, I know she is trying to get these under better control.  I think we can get her back in 3 months.  I feel confident about this.  I do not think we need to do any scans for this lower back pain.  It sounds very consistent with spinal stenosis.  The pain is worse when she stands and walks and better when she sits.   Volanda Napoleon, MD 8/18/20229:28 AM

## 2021-06-17 NOTE — Telephone Encounter (Signed)
Appts made per 06/17/21 los and pt to gain new sch at [facility] and through [employer]

## 2021-06-17 NOTE — Patient Instructions (Signed)
Rituximab Injection What is this medication? RITUXIMAB (ri TUX i mab) is a monoclonal antibody. It is used to treat certain types of cancer like non-Hodgkin lymphoma and chronic lymphocytic leukemia. It is also used to treat rheumatoid arthritis, granulomatosis with polyangiitis,microscopic polyangiitis, and pemphigus vulgaris. This medicine may be used for other purposes; ask your health care provider orpharmacist if you have questions. COMMON BRAND NAME(S): RIABNI, Rituxan, RUXIENCE What should I tell my care team before I take this medication? They need to know if you have any of these conditions: chest pain heart disease infection especially a viral infection such as chickenpox, cold sores, hepatitis B, or herpes immune system problems irregular heartbeat or rhythm kidney disease low blood counts (white cells, platelets, or red cells) lung disease recent or upcoming vaccine an unusual or allergic reaction to rituximab, other medicines, foods, dyes, or preservatives pregnant or trying to get pregnant breast-feeding How should I use this medication? This medicine is injected into a vein. It is given by a health care provider ina hospital or clinic setting. A special MedGuide will be given to you before each treatment. Be sure to readthis information carefully each time. Talk to your health care provider about the use of this medicine in children. While this drug may be prescribed for children as young as 6 months forselected conditions, precautions do apply. Overdosage: If you think you have taken too much of this medicine contact apoison control center or emergency room at once. NOTE: This medicine is only for you. Do not share this medicine with others. What if I miss a dose? Keep appointments for follow-up doses. It is important not to miss your dose.Call your health care provider if you are unable to keep an appointment. What may interact with this medication? Do not take this  medicine with any of the following medicines: live vaccines This medicine may also interact with the following medicines: cisplatin This list may not describe all possible interactions. Give your health care provider a list of all the medicines, herbs, non-prescription drugs, or dietary supplements you use. Also tell them if you smoke, drink alcohol, or use illegaldrugs. Some items may interact with your medicine. What should I watch for while using this medication? Your condition will be monitored carefully while you are receiving thismedicine. You may need blood work done while you are taking this medicine. This medicine can cause serious infusion reactions. To reduce the risk your health care provider may give you other medicines to take before receiving thisone. Be sure to follow the directions from your health care provider. This medicine may increase your risk of getting an infection. Call your health care provider for advice if you get a fever, chills, sore throat, or other symptoms of a cold or flu. Do not treat yourself. Try to avoid being aroundpeople who are sick. Call your health care provider if you are around anyone with measles,chickenpox, or if you develop sores or blisters that do not heal properly. Avoid taking medicines that contain aspirin, acetaminophen, ibuprofen, naproxen, or ketoprofen unless instructed by your health care provider. Thesemedicines may hide a fever. This medicine may cause serious skin reactions. They can happen weeks to months after starting the medicine. Contact your health care provider right away if you notice fevers or flu-like symptoms with a rash. The rash may be red or purple and then turn into blisters or peeling of the skin. Or, you might notice a red rash with swelling of the face, lips or lymph nodes   in your neck or underyour arms. In some patients, this medicine may cause a serious brain infection that may cause death. If you have any problems seeing,  thinking, speaking, walking, or standing, tell your healthcare professional right away. If you cannot reachyour healthcare professional, urgently seek other source of medical care. Do not become pregnant while taking this medicine or for at least 12 months after stopping it. Women should inform their health care provider if they wish to become pregnant or think they might be pregnant. There is potential for serious harm to an unborn child. Talk to your health care provider for more information. Women should use a reliable form of birth control while taking this medicine and for 12 months after stopping it. Do not breast-feed whiletaking this medicine or for at least 6 months after stopping it. What side effects may I notice from receiving this medication? Side effects that you should report to your health care provider as soon aspossible: allergic reactions (skin rash, itching or hives; swelling of the face, lips, or tongue) diarrhea edema (sudden weight gain; swelling of the ankles, feet, hands or other unusual swelling; trouble breathing) fast, irregular heartbeat heart attack (trouble breathing; pain or tightness in the chest, neck, back or arms; unusually weak or tired) infection (fever, chills, cough, sore throat, pain or trouble passing urine) kidney injury (trouble passing urine or change in the amount of urine) liver injury (dark yellow or brown urine; general ill feeling or flu-like symptoms; loss of appetite, right upper belly pain; unusually weak or tired, yellowing of the eyes or skin) low blood pressure (dizziness; feeling faint or lightheaded, falls; unusually weak or tired) low red blood cell counts (trouble breathing; feeling faint; lightheaded, falls; unusually weak or tired) mouth sores redness, blistering, peeling, or loosening of the skin, including inside the mouth stomach pain unusual bruising or bleeding wheezing (trouble breathing with loud or whistling  sounds) vomiting Side effects that usually do not require medical attention (report to yourhealth care provider if they continue or are bothersome): headache joint pain muscle cramps, pain nausea This list may not describe all possible side effects. Call your doctor for medical advice about side effects. You may report side effects to FDA at1-800-FDA-1088. Where should I keep my medication? This medicine is given in a hospital or clinic. It will not be stored at home. NOTE: This sheet is a summary. It may not cover all possible information. If you have questions about this medicine, talk to your doctor, pharmacist, orhealth care provider.  2022 Elsevier/Gold Standard (2020-10-08 15:47:26)  

## 2021-06-18 LAB — IGG, IGA, IGM
IgA: 94 mg/dL (ref 87–352)
IgG (Immunoglobin G), Serum: 565 mg/dL — ABNORMAL LOW (ref 586–1602)
IgM (Immunoglobulin M), Srm: 149 mg/dL (ref 26–217)

## 2021-07-22 ENCOUNTER — Other Ambulatory Visit: Payer: Self-pay | Admitting: Internal Medicine

## 2021-07-22 DIAGNOSIS — M5416 Radiculopathy, lumbar region: Secondary | ICD-10-CM

## 2021-07-28 ENCOUNTER — Other Ambulatory Visit: Payer: Self-pay

## 2021-07-28 ENCOUNTER — Ambulatory Visit
Admission: RE | Admit: 2021-07-28 | Discharge: 2021-07-28 | Disposition: A | Discharge status: Home / Self Care | Payer: Medicare Other | Source: Ambulatory Visit | Attending: Internal Medicine | Admitting: Internal Medicine

## 2021-07-28 DIAGNOSIS — M5416 Radiculopathy, lumbar region: Secondary | ICD-10-CM

## 2021-08-17 ENCOUNTER — Other Ambulatory Visit: Payer: Self-pay | Admitting: Student

## 2021-08-17 DIAGNOSIS — M5416 Radiculopathy, lumbar region: Secondary | ICD-10-CM

## 2021-08-25 ENCOUNTER — Ambulatory Visit
Admission: RE | Admit: 2021-08-25 | Discharge: 2021-08-25 | Disposition: A | Discharge status: Home / Self Care | Payer: Medicare Other | Source: Ambulatory Visit | Attending: Student | Admitting: Student

## 2021-08-25 DIAGNOSIS — M5416 Radiculopathy, lumbar region: Secondary | ICD-10-CM

## 2021-08-25 MED ORDER — METHYLPREDNISOLONE ACETATE 40 MG/ML INJ SUSP (RADIOLOG
80.0000 mg | Freq: Once | INTRAMUSCULAR | Status: AC
  Administered 2021-08-25: 80 mg via EPIDURAL

## 2021-08-25 MED ORDER — IOPAMIDOL (ISOVUE-M 200) INJECTION 41%: 15.0000 mL | Freq: Once | INTRAMUSCULAR | Status: DC

## 2021-08-25 MED ORDER — ONDANSETRON HCL 4 MG/2ML IJ SOLN: 4.0000 mg | Freq: Once | INTRAMUSCULAR | Status: DC | PRN

## 2021-08-25 MED ORDER — MEPERIDINE HCL 50 MG/ML IJ SOLN
50.0000 mg | Freq: Once | INTRAMUSCULAR | Status: DC | PRN
Start: 2021-08-25 — End: 2021-08-26

## 2021-08-25 MED ORDER — DIAZEPAM 5 MG PO TABS: 5.0000 mg | ORAL_TABLET | Freq: Once | ORAL | Status: DC

## 2021-08-25 MED ORDER — IOPAMIDOL (ISOVUE-M 200) INJECTION 41%
1.0000 mL | Freq: Once | INTRAMUSCULAR | Status: AC
  Administered 2021-08-25: 1 mL via EPIDURAL

## 2021-08-25 NOTE — Discharge Instructions (Signed)
Myelogram Discharge Instructions  Go home and rest quietly as needed. You may resume normal activities; however, do not exert yourself strongly or do any heavy lifting today and tomorrow.   DO NOT drive today.    You may resume your normal diet and medications unless otherwise indicated. Drink a lot of extra fluids today and tomorrow.   The incidence of a spinal headache is about 5% (one in 20 patients).  If you develop a headache when you are sitting up or standing that gets better when you lie down, please lie flat for 24 hours and drink plenty of fluids until the headache goes away.  Caffeinated beverages may be helpful.   If you develop severe nausea and vomiting or a headache that does not go away with the flat bedrest after 48 hours, please call [phone].   Call your physician for a follow-up appointment.  The results of your myelogram will be sent directly to your physician by the following day.  Please call us at [phone] if you have any questions or if complications develop after you arrive home.   Discharge instructions have been explained to the patient.  The patient, or the person responsible for the patient, state they fully understands these instructions.   Thank you for visiting our office today.

## 2021-08-28 ENCOUNTER — Other Ambulatory Visit: Payer: Self-pay | Admitting: Hematology & Oncology

## 2021-08-29 ENCOUNTER — Encounter: Payer: Self-pay | Admitting: Hematology & Oncology

## 2021-09-01 ENCOUNTER — Other Ambulatory Visit: Payer: Self-pay | Admitting: Internal Medicine

## 2021-09-01 DIAGNOSIS — E041 Nontoxic single thyroid nodule: Secondary | ICD-10-CM

## 2021-09-16 ENCOUNTER — Inpatient Hospital Stay: Payer: Medicare Other | Attending: Hematology & Oncology

## 2021-09-16 ENCOUNTER — Inpatient Hospital Stay: Payer: Medicare Other | Admitting: Hematology & Oncology

## 2021-09-16 ENCOUNTER — Inpatient Hospital Stay: Payer: Medicare Other

## 2021-09-16 ENCOUNTER — Encounter: Payer: Self-pay | Admitting: Hematology & Oncology

## 2021-09-16 VITALS — BP 157/62 | Temp 98.0°F | Resp 18 | Wt 180.4 lb

## 2021-09-16 DIAGNOSIS — C8308 Small cell B-cell lymphoma, lymph nodes of multiple sites: Secondary | ICD-10-CM | POA: Diagnosis not present

## 2021-09-16 DIAGNOSIS — Z79899 Other long term (current) drug therapy: Secondary | ICD-10-CM | POA: Insufficient documentation

## 2021-09-16 DIAGNOSIS — M48061 Spinal stenosis, lumbar region without neurogenic claudication: Secondary | ICD-10-CM | POA: Diagnosis not present

## 2021-09-16 LAB — CBC WITH DIFFERENTIAL (CANCER CENTER ONLY)
Abs Immature Granulocytes: 0 10*3/uL (ref 0.00–0.07)
Basophils Absolute: 0 10*3/uL (ref 0.0–0.1)
Basophils Relative: 1 %
Eosinophils Absolute: 0.2 10*3/uL (ref 0.0–0.5)
Eosinophils Relative: 6 %
HCT: 39.2 % (ref 36.0–46.0)
Hemoglobin: 12.7 g/dL (ref 12.0–15.0)
Lymphocytes Relative: 65 %
Lymphs Abs: 1.8 10*3/uL (ref 0.7–4.0)
MCH: 25.6 pg — ABNORMAL LOW (ref 26.0–34.0)
MCHC: 32.4 g/dL (ref 30.0–36.0)
MCV: 79 fL — ABNORMAL LOW (ref 80.0–100.0)
Metamyelocytes Relative: 1 %
Monocytes Absolute: 0.6 10*3/uL (ref 0.1–1.0)
Monocytes Relative: 21 %
Neutro Abs: 0.2 10*3/uL — CL (ref 1.7–7.7)
Neutrophils Relative %: 6 %
Platelet Count: 270 10*3/uL (ref 150–400)
RBC: 4.96 MIL/uL (ref 3.87–5.11)
RDW: 14.8 % (ref 11.5–15.5)
WBC Count: 2.7 10*3/uL — ABNORMAL LOW (ref 4.0–10.5)
nRBC: 0 % (ref 0.0–0.2)

## 2021-09-16 LAB — CMP (CANCER CENTER ONLY)
ALT: 38 U/L (ref 0–44)
AST: 27 U/L (ref 15–41)
Albumin: 4 g/dL (ref 3.5–5.0)
Alkaline Phosphatase: 70 U/L (ref 38–126)
Anion gap: 11 (ref 5–15)
BUN: 13 mg/dL (ref 8–23)
CO2: 32 mmol/L (ref 22–32)
Calcium: 9.8 mg/dL (ref 8.9–10.3)
Chloride: 97 mmol/L — ABNORMAL LOW (ref 98–111)
Creatinine: 0.73 mg/dL (ref 0.44–1.00)
GFR, Estimated: >60 mL/min (ref >60)
Glucose, Bld: 299 mg/dL — ABNORMAL HIGH (ref 70–99)
Potassium: 3.6 mmol/L (ref 3.5–5.1)
Sodium: 140 mmol/L (ref 135–145)
Total Bilirubin: 0.6 mg/dL (ref 0.3–1.2)
Total Protein: 6.7 g/dL (ref 6.5–8.1)

## 2021-09-16 LAB — LACTATE DEHYDROGENASE: LDH: 183 U/L (ref 98–192)

## 2021-09-16 NOTE — Progress Notes (Signed)
Hematology and Oncology Follow Up Visit  Valerie Cline [id number] 1956/03/01 65 y.o. 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  She has been quite active since we last saw her.  She been seen by neurosurgery because of back problems.  She ultimately had a myelogram that was done.  She has severe spinal stenosis at L5-S1.  The neurosurgeon, Dr. Ronnald Ramp, is trying to do what he can not to operate..  She also had influenza.  This was about 3 weeks ago.  She started off with some sinusitis.  Her white cell count is quite low.  I will worried about this.  I just do not think we have to give her Rituxan today.  I do not want her to have a further compromise of her immune system during the Thanksgiving holiday.  She has had no rashes.  She has had little bit of diarrhea with the influenza.  She had little bit of vomiting with the influenza.  She has had no leg swelling.  Her blood sugars is on the high side.  Today her blood sugar was 299.  She has had no headache.  Overall, I would say performance status is ECOG 1.    Medications:  Allergies as of 09/16/2021       Reactions   Canagliflozin Other (See Comments)   recurrent vaginitis   Glucophage [metformin] Other (See Comments)   GI intolerance   Niaspan [niacin] Other (See Comments)   flushing        Medication List        Accurate as of September 16, 2021  9:10 AM. If you have any questions, ask your nurse or doctor.          amLODipine 2.5 MG tablet Commonly known as: NORVASC TAKE 1 TABLET BY MOUTH EVERY DAY   benazepril 20 MG tablet Commonly known as: LOTENSIN Take 20 mg by mouth at bedtime.   cyclobenzaprine 10 MG  tablet Commonly known as: FLEXERIL Take 5-10 mg by mouth every 8 (eight) hours as needed.   famciclovir 500 MG tablet Commonly known as: FAMVIR Take 1 tablet (500 mg total) by mouth daily.   Farxiga 5 MG Tabs tablet Generic drug: dapagliflozin propanediol Take 5 mg by mouth daily.   Fish Oil 1000 MG Caps Take 2 capsules (2,000 mg total) by mouth 2 (two) times daily.   hydrochlorothiazide 25 MG tablet Commonly known as: HYDRODIURIL Take 25 mg by mouth every morning.   insulin regular human CONCENTRATED 500 UNIT/ML injection Commonly known as: HUMULIN R Inject 50-70 Units into the skin daily.  Inject 80 units subcutaneously before breakfast, and 50 units before lunch and 90 for supper   Potassium 75 MG Tabs Take 300 mg by mouth daily.   rosuvastatin 10 MG tablet Commonly known as: CRESTOR Take 10 mg by mouth at bedtime.   Trulicity 1.5 NO/7.0JG Sopn Generic drug: Dulaglutide Inject 1.5 mg into the skin once a week.        Allergies:  Allergies  Allergen Reactions   Canagliflozin Other (See Comments)    recurrent vaginitis   Glucophage [Metformin] Other (See Comments)    GI intolerance  Niaspan [Niacin] Other (See Comments)    flushing    Past Medical History, Surgical history, Social history, and Family History were reviewed and updated.  Review of Systems: Review of Systems  Constitutional: Negative.   HENT: Negative.    Eyes: Negative.   Respiratory: Negative.    Cardiovascular: Negative.   Gastrointestinal: Negative.   Genitourinary: Negative.   Musculoskeletal: Negative.   Skin: Negative.   Neurological: Negative.   Endo/Heme/Allergies: Negative.   Psychiatric/Behavioral: Negative.       Physical Exam:  weight is 180 lb 6.4 oz (81.8 kg). Her oral temperature is 98 F (36.7 C). Her blood pressure is 157/62 (abnormal). Her respiration is 18 and oxygen saturation is 98%.   Wt Readings from Last 3 Encounters:  09/16/21 180 lb 6.4 oz (81.8 kg)   06/17/21 180 lb (81.6 kg)  03/17/21 178 lb (80.7 kg)    Vital signs show temperature of 98.4.  Pulse 78.  Blood pressure 138/53.  Weight is 173 pounds.  Physical Exam Vitals reviewed.  HENT:     Cline: Normocephalic and atraumatic.  Eyes:     Pupils: Pupils are equal, round, and reactive to light.  Cardiovascular:     Rate and Rhythm: Normal rate and regular rhythm.     Heart sounds: Normal heart sounds.  Pulmonary:     Effort: Pulmonary effort is normal.     Breath sounds: Normal breath sounds.  Abdominal:     General: Bowel sounds are normal.     Palpations: Abdomen is soft.  Musculoskeletal:        General: No tenderness or deformity. Normal range of motion.     Cervical back: Normal range of motion.  Lymphadenopathy:     Cervical: No cervical adenopathy.  Skin:    General: Skin is warm and dry.     Findings: No erythema or rash.  Neurological:     Mental Status: She is alert and oriented to person, place, and time.  Psychiatric:        Behavior: Behavior normal.        Thought Content: Thought content normal.        Judgment: Judgment normal.     Lab Results  Component Value Date   WBC 2.7 (L) 09/16/2021   HGB 12.7 09/16/2021   HCT 39.2 09/16/2021   MCV 79.0 (L) 09/16/2021   PLT 270 09/16/2021   Lab Results  Component Value Date   FERRITIN 305 11/11/2019   Lab Results  Component Value Date   RBC 4.96 09/16/2021   Lab Results  Component Value Date   KPAFRELGTCHN 131.9 (H) 12/30/2019   LAMBDASER 29.8 (H) 12/30/2019   KAPLAMBRATIO 4.43 (H) 12/30/2019   Lab Results  Component Value Date   IGGSERUM 565 (L) 06/17/2021   IGA 94 06/17/2021   IGMSERUM 149 06/17/2021   Lab Results  Component Value Date   TOTALPROTELP 6.6 12/30/2019   ALBUMINELP 3.8 12/30/2019   A1GS 0.2 12/30/2019   A2GS 0.7 12/30/2019   BETS 0.9 12/30/2019   GAMS 1.0 12/30/2019   MSPIKE Not Observed 12/30/2019     Chemistry      Component Value Date/Time   NA 140 09/16/2021  0821   K 3.6 09/16/2021 0821   CL 97 (L) 09/16/2021 0821   CO2 32 09/16/2021 0821   BUN 13 09/16/2021 0821   CREATININE 0.73 09/16/2021 0821      Component Value Date/Time   CALCIUM 9.8 09/16/2021 0821   ALKPHOS 70  09/16/2021 0821   AST 27 09/16/2021 0821   ALT 38 09/16/2021 0821   BILITOT 0.6 09/16/2021 0821       Impression and Plan: Valerie Cline is a very pleasant 65 yo caucasian female with small lymphocytic B cell non-Hodgkin's lymphoma.  She completed her chemotherapy .  She had 6 cycles of Rituxan/bendamustine.  She completed this on 07/10/2020.  She is on maintenance Rituxan.    I would hold off on her Rituxan for 3 weeks.  This will be her sixth cycle of Rituxan.    I would like to believe that her blood count will be better in 3 weeks.   Volanda Napoleon, MD 11/17/20229:10 AM

## 2021-09-17 ENCOUNTER — Other Ambulatory Visit: Payer: Self-pay | Admitting: Internal Medicine

## 2021-09-17 DIAGNOSIS — Z1231 Encounter for screening mammogram for malignant neoplasm of breast: Secondary | ICD-10-CM

## 2021-09-17 LAB — IGG, IGA, IGM
IgA: 141 mg/dL (ref 87–352)
IgG (Immunoglobin G), Serum: 471 mg/dL — ABNORMAL LOW (ref 586–1602)
IgM (Immunoglobulin M), Srm: 121 mg/dL (ref 26–217)

## 2021-09-20 ENCOUNTER — Other Ambulatory Visit: Payer: Medicare Other

## 2021-09-20 LAB — PROTEIN ELECTROPHORESIS, SERUM, WITH REFLEX
A/G Ratio: 1.2 (ref 0.7–1.7)
Albumin ELP: 3.4 g/dL (ref 2.9–4.4)
Alpha-1-Globulin: 0.3 g/dL (ref 0.0–0.4)
Alpha-2-Globulin: 1.1 g/dL — ABNORMAL HIGH (ref 0.4–1.0)
Beta Globulin: 1 g/dL (ref 0.7–1.3)
Gamma Globulin: 0.5 g/dL (ref 0.4–1.8)
Globulin, Total: 2.9 g/dL (ref 2.2–3.9)
Total Protein ELP: 6.3 g/dL (ref 6.0–8.5)

## 2021-09-22 ENCOUNTER — Other Ambulatory Visit: Payer: Self-pay | Admitting: Hematology & Oncology

## 2021-09-29 ENCOUNTER — Ambulatory Visit
Admission: RE | Admit: 2021-09-29 | Discharge: 2021-09-29 | Disposition: A | Discharge status: Home / Self Care | Payer: Medicare Other | Source: Ambulatory Visit | Attending: Internal Medicine | Admitting: Internal Medicine

## 2021-09-29 DIAGNOSIS — E041 Nontoxic single thyroid nodule: Secondary | ICD-10-CM

## 2021-09-30 ENCOUNTER — Telehealth: Payer: Self-pay

## 2021-09-30 NOTE — Telephone Encounter (Signed)
    Patient Name: Valerie Cline  DOB: Oct 31, 1956 MRN: [id number]  Primary Cardiologist: Dr. Geraldo Pitter  Chart reviewed as part of pre-operative protocol coverage. Patient was last seen by Dr. Geraldo Pitter in 02/2021 at which time she was doing well. Patient was contacted today for further pre-op evaluation and reported doing well since last visit. She denies any chest pain, shortness of breath, orthopnea/PND, significant palpitations, lightheadedness/dizziness, or syncope. Her activity is limited some by her back pain but she is still able to complete >4.0 METS without any problems. Given past medical history and time since last visit, based on ACC/AHA guidelines, Valerie Cline would be at acceptable risk for the planned procedure without further cardiovascular testing.   Patient has never had a MI, coronary intervention, or CVA. She is on Aspirin due to coronary calcifications noted on prior CT. Therefore, OK to hold this for 5-7 days if needed prior to surgery. Please restart Aspirin as soon as safely possible postoperatively.  I will route this recommendation to the requesting party via Epic fax function and remove from pre-op pool.  Please call with questions.  Darreld Mclean, PA-C 09/30/2021, 9:19 AM

## 2021-09-30 NOTE — Telephone Encounter (Signed)
Left message to call back and ask to speak with pre-op team.  Darreld Mclean, PA-C 09/30/2021 7:57 AM

## 2021-09-30 NOTE — Telephone Encounter (Signed)
   [facility] Pre-operative Risk Assessment    Patient Name: ELEXIS POLLAK  DOB: 01/23/1956 MRN: [id number]  HEARTCARE STAFF:  - IMPORTANT!!!!!! Under Visit Info/Reason for Call, type in Other and utilize the format Clearance MM/DD/YY or Clearance TBD. Do not use dashes or single digits. - Please review there is not already an duplicate clearance open for this procedure. - If request is for dental extraction, please clarify the # of teeth to be extracted. - If the patient is currently at the dentist's office, call Pre-Op Callback Staff (MA/nurse) to input urgent request.  - If the patient is not currently in the dentist office, please route to the Pre-Op pool.  Request for surgical clearance:  What type of surgery is being performed? L1-2, L2-3 Lumbar Fusion  When is this surgery scheduled? TBD  What type of clearance is required (medical clearance vs. Pharmacy clearance to hold med vs. Both)? Both  Are there any medications that need to be held prior to surgery and how long? Anticoagulants  Practice name and name of physician performing surgery? [facility]- Dr. Mikeal Hawthorne  What is the office phone number? [phone]   7.   What is the office fax number? [phone] Attention Vanessa  8.   Anesthesia type (None, local, MAC, general) ? General   Lowella Grip 09/30/2021, 7:25 AM  _________________________________________________________________   (provider comments below)

## 2021-10-05 ENCOUNTER — Other Ambulatory Visit: Payer: Self-pay

## 2021-10-05 ENCOUNTER — Encounter: Payer: Self-pay | Admitting: Family

## 2021-10-05 ENCOUNTER — Inpatient Hospital Stay: Payer: Medicare Other

## 2021-10-05 ENCOUNTER — Inpatient Hospital Stay (HOSPITAL_BASED_OUTPATIENT_CLINIC_OR_DEPARTMENT_OTHER): Payer: Medicare Other | Admitting: Family

## 2021-10-05 ENCOUNTER — Inpatient Hospital Stay: Payer: Medicare Other | Attending: Hematology & Oncology

## 2021-10-05 VITALS — BP 146/58 | HR 82 | Temp 97.6°F | Resp 18 | Ht 66.14 in | Wt 179.1 lb

## 2021-10-05 VITALS — BP 124/53 | HR 70 | Temp 98.8°F | Resp 18

## 2021-10-05 DIAGNOSIS — C8308 Small cell B-cell lymphoma, lymph nodes of multiple sites: Secondary | ICD-10-CM

## 2021-10-05 DIAGNOSIS — Z5112 Encounter for antineoplastic immunotherapy: Secondary | ICD-10-CM | POA: Insufficient documentation

## 2021-10-05 DIAGNOSIS — Z79899 Other long term (current) drug therapy: Secondary | ICD-10-CM | POA: Insufficient documentation

## 2021-10-05 DIAGNOSIS — E876 Hypokalemia: Secondary | ICD-10-CM

## 2021-10-05 LAB — CMP (CANCER CENTER ONLY)
ALT: 44 U/L (ref 0–44)
AST: 35 U/L (ref 15–41)
Albumin: 4.3 g/dL (ref 3.5–5.0)
Alkaline Phosphatase: 65 U/L (ref 38–126)
Anion gap: 11 (ref 5–15)
BUN: 15 mg/dL (ref 8–23)
CO2: 33 mmol/L — ABNORMAL HIGH (ref 22–32)
Calcium: 9.8 mg/dL (ref 8.9–10.3)
Chloride: 96 mmol/L — ABNORMAL LOW (ref 98–111)
Creatinine: 0.7 mg/dL (ref 0.44–1.00)
GFR, Estimated: >60 mL/min (ref >60)
Glucose, Bld: 253 mg/dL — ABNORMAL HIGH (ref 70–99)
Potassium: 2.9 mmol/L — ABNORMAL LOW (ref 3.5–5.1)
Sodium: 140 mmol/L (ref 135–145)
Total Bilirubin: 1 mg/dL (ref 0.3–1.2)
Total Protein: 6.5 g/dL (ref 6.5–8.1)

## 2021-10-05 LAB — SAVE SMEAR(SSMR), FOR PROVIDER SLIDE REVIEW

## 2021-10-05 LAB — CBC WITH DIFFERENTIAL (CANCER CENTER ONLY)
Abs Immature Granulocytes: 0.02 10*3/uL (ref 0.00–0.07)
Basophils Absolute: 0 10*3/uL (ref 0.0–0.1)
Basophils Relative: 0 %
Eosinophils Absolute: 0.6 10*3/uL — ABNORMAL HIGH (ref 0.0–0.5)
Eosinophils Relative: 7 %
HCT: 40.4 % (ref 36.0–46.0)
Hemoglobin: 13.3 g/dL (ref 12.0–15.0)
Immature Granulocytes: 0 %
Lymphocytes Relative: 25 %
Lymphs Abs: 1.9 10*3/uL (ref 0.7–4.0)
MCH: 25.6 pg — ABNORMAL LOW (ref 26.0–34.0)
MCHC: 32.9 g/dL (ref 30.0–36.0)
MCV: 77.7 fL — ABNORMAL LOW (ref 80.0–100.0)
Monocytes Absolute: 1 10*3/uL (ref 0.1–1.0)
Monocytes Relative: 13 %
Neutro Abs: 4.2 10*3/uL (ref 1.7–7.7)
Neutrophils Relative %: 55 %
Platelet Count: 214 10*3/uL (ref 150–400)
RBC: 5.2 MIL/uL — ABNORMAL HIGH (ref 3.87–5.11)
RDW: 15.3 % (ref 11.5–15.5)
WBC Count: 7.6 10*3/uL (ref 4.0–10.5)
nRBC: 0 % (ref 0.0–0.2)

## 2021-10-05 LAB — LACTATE DEHYDROGENASE: LDH: 168 U/L (ref 98–192)

## 2021-10-05 MED ORDER — SODIUM CHLORIDE 0.9 % IV SOLN: Freq: Once | INTRAVENOUS | Status: AC

## 2021-10-05 MED ORDER — SODIUM CHLORIDE 0.9 % IV SOLN
375.0000 mg/m2 | Freq: Once | INTRAVENOUS | Status: AC
  Administered 2021-10-05: 700 mg via INTRAVENOUS
  Filled 2021-10-05: qty 50

## 2021-10-05 MED ORDER — ACETAMINOPHEN 325 MG PO TABS
650.0000 mg | ORAL_TABLET | Freq: Once | ORAL | Status: AC
  Administered 2021-10-05: 650 mg via ORAL
  Filled 2021-10-05: qty 2

## 2021-10-05 MED ORDER — POTASSIUM CHLORIDE CRYS ER 20 MEQ PO TBCR
40.0000 meq | EXTENDED_RELEASE_TABLET | Freq: Two times a day (BID) | ORAL | Status: DC
  Administered 2021-10-05: 40 meq via ORAL
  Filled 2021-10-05: qty 2

## 2021-10-05 MED ORDER — DIPHENHYDRAMINE HCL 25 MG PO CAPS
50.0000 mg | ORAL_CAPSULE | Freq: Once | ORAL | Status: AC
  Administered 2021-10-05: 50 mg via ORAL
  Filled 2021-10-05: qty 2

## 2021-10-05 NOTE — Patient Instructions (Signed)
[facility]  Discharge Instructions: ?Thank you for choosing [facility] to provide your oncology and hematology care.  ? ?If you have a lab appointment with the Cancer Center, please go directly to the Cancer Center and check in at the registration area. ? ?Wear comfortable clothing and clothing appropriate for easy access to any Portacath or PICC line.  ? ?We strive to give you quality time with your provider. You may need to reschedule your appointment if you arrive late (15 or more minutes).  Arriving late affects you and other patients whose appointments are after yours.  Also, if you miss three or more appointments without notifying the office, you may be dismissed from the clinic at the provider?s discretion.    ?  ?For prescription refill requests, have your pharmacy contact our office and allow 72 hours for refills to be completed.   ? ?Today you received the following chemotherapy and/or immunotherapy agents Rituxan  ?  ?To help prevent nausea and vomiting after your treatment, we encourage you to take your nausea medication as directed. ? ?BELOW ARE SYMPTOMS THAT SHOULD BE REPORTED IMMEDIATELY: ?*FEVER GREATER THAN 100.4 F (38 ?C) OR HIGHER ?*CHILLS OR SWEATING ?*NAUSEA AND VOMITING THAT IS NOT CONTROLLED WITH YOUR NAUSEA MEDICATION ?*UNUSUAL SHORTNESS OF BREATH ?*UNUSUAL BRUISING OR BLEEDING ?*URINARY PROBLEMS (pain or burning when urinating, or frequent urination) ?*BOWEL PROBLEMS (unusual diarrhea, constipation, pain near the anus) ?TENDERNESS IN MOUTH AND THROAT WITH OR WITHOUT PRESENCE OF ULCERS (sore throat, sores in mouth, or a toothache) ?UNUSUAL RASH, SWELLING OR PAIN  ?UNUSUAL VAGINAL DISCHARGE OR ITCHING  ? ?Items with * indicate a potential emergency and should be followed up as soon as possible or go to the Emergency Department if any problems should occur. ? ?Please show the CHEMOTHERAPY ALERT CARD or IMMUNOTHERAPY ALERT CARD at check-in to the  Emergency Department and triage nurse. ?Should you have questions after your visit or need to cancel or reschedule your appointment, please contact [facility]  [phone] and follow the prompts.  Office hours are 8:00 a.m. to 4:30 p.m. Monday - Friday. Please note that voicemails left after 4:00 p.m. may not be returned until the following business day.  We are closed weekends and major holidays. You have access to a nurse at all times for urgent questions. Please call the main number to the clinic [phone] and follow the prompts. ? ?For any non-urgent questions, you may also contact your provider using MyChart. We now offer e-Visits for anyone 18 and older to request care online for non-urgent symptoms. For details visit [email or web address]. ?  ?Also download the MyChart app! Go to the app store, search "MyChart", open the app, select [facility], and log in with your MyChart username and password. ? ?Due to Covid, a mask is required upon entering the hospital/clinic. If you do not have a mask, one will be given to you upon arrival. For doctor visits, patients may have 1 support person aged 18 or older with them. For treatment visits, patients cannot have anyone with them due to current Covid guidelines and our immunocompromised population.  ?

## 2021-10-05 NOTE — Progress Notes (Signed)
Hematology and Oncology Follow Up Visit  Valerie Cline [id number] Sep 21, 1956 65 y.o. 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 She is doing well but notes a little fatigue at times.  She ran out of her potassium supplement several weeks ago and did not refill. Her potassium today is 2.9. Blood glucose if 253.  WBC count has improved to 7.6 and ANC 4.2.  No fever, chills, n/v, cough, rash, dizziness, SOB, chest pain, palpitations, abdominal pain or changes in bowel or bladder habits.  No swelling, tenderness, numbness or tingling in her extremities.  No falls or syncope to report.  She has been eating well and doing her best to stay well hydrated.   ECOG Performance Status: 1 - Symptomatic but completely ambulatory  Medications:  Allergies as of 10/05/2021       Reactions   Canagliflozin Other (See Comments)   recurrent vaginitis   Glucophage [metformin] Other (See Comments)   GI intolerance   Niaspan [niacin] Other (See Comments)   flushing        Medication List        Accurate as of October 05, 2021 10:25 AM. If you have any questions, ask your nurse or doctor.          STOP taking these medications    cyclobenzaprine 10 MG tablet Commonly known as: FLEXERIL Stopped by: Lottie Dawson, NP       TAKE these medications    amLODipine 2.5 MG tablet Commonly known as: NORVASC TAKE 1 TABLET BY MOUTH EVERY Cline   benazepril 20 MG tablet Commonly known as: LOTENSIN Take 20 mg by mouth at bedtime.   famciclovir 500 MG tablet Commonly known as: FAMVIR Take 1 tablet (500 mg total) by mouth daily.   Farxiga 5 MG Tabs tablet Generic drug: dapagliflozin propanediol Take 5 mg by mouth  daily.   Fish Oil 1000 MG Caps Take 2 capsules (2,000 mg total) by mouth 2 (two) times daily.   hydrochlorothiazide 25 MG tablet Commonly known as: HYDRODIURIL Take 25 mg by mouth every morning.   insulin regular human CONCENTRATED 500 UNIT/ML injection Commonly known as: HUMULIN R Inject 50-70 Units into the skin daily.  Inject 80 units subcutaneously before breakfast, and 50 units before lunch and 90 for supper   Potassium 75 MG Tabs Take 300 mg by mouth daily.   rosuvastatin 10 MG tablet Commonly known as: CRESTOR Take 10 mg by mouth at bedtime.   Trulicity 1.5 EX/5.2WU Sopn Generic drug: Dulaglutide Inject 1.5 mg into the skin once a week.        Allergies:  Allergies  Allergen Reactions   Canagliflozin Other (See Comments)    recurrent vaginitis   Glucophage [Metformin] Other (See Comments)    GI intolerance   Niaspan [Niacin] Other (See Comments)    flushing    Past Medical History, Surgical history, Social history, and Family History were reviewed and updated.  Review of Systems: All other 10 point review of systems is negative.   Physical Exam:  height is 5' 6.14" (1.68 m) and weight is 179 lb 1.9 oz (81.2 kg). Her oral temperature is 97.6 F (36.4 C). Her blood pressure is 146/58 (abnormal)  and her pulse is 82. Her respiration is 18 and oxygen saturation is 100%.   Wt Readings from Last 3 Encounters:  10/05/21 179 lb 1.9 oz (81.2 kg)  09/16/21 180 lb 6.4 oz (81.8 kg)  06/17/21 180 lb (81.6 kg)    Ocular: Sclerae unicteric, pupils equal, round and reactive to light Ear-nose-throat: Oropharynx clear, dentition fair Lymphatic: No cervical or supraclavicular adenopathy Lungs no rales or rhonchi, good excursion bilaterally Heart regular rate and rhythm, no murmur appreciated Abd soft, nontender, positive bowel sounds MSK no focal spinal tenderness, no joint edema Neuro: non-focal, well-oriented, appropriate affect Breasts: Deferred   Lab Results   Component Value Date   WBC 7.6 10/05/2021   HGB 13.3 10/05/2021   HCT 40.4 10/05/2021   MCV 77.7 (L) 10/05/2021   PLT 214 10/05/2021   Lab Results  Component Value Date   FERRITIN 305 11/11/2019   Lab Results  Component Value Date   RBC 5.20 (H) 10/05/2021   Lab Results  Component Value Date   KPAFRELGTCHN 131.9 (H) 12/30/2019   LAMBDASER 29.8 (H) 12/30/2019   KAPLAMBRATIO 4.43 (H) 12/30/2019   Lab Results  Component Value Date   IGGSERUM 471 (L) 09/16/2021   IGA 141 09/16/2021   IGMSERUM 121 09/16/2021   Lab Results  Component Value Date   TOTALPROTELP 6.3 09/16/2021   ALBUMINELP 3.4 09/16/2021   A1GS 0.3 09/16/2021   A2GS 1.1 (H) 09/16/2021   BETS 1.0 09/16/2021   GAMS 0.5 09/16/2021   MSPIKE Not Observed 09/16/2021     Chemistry      Component Value Date/Time   NA 140 10/05/2021 0919   K 2.9 (L) 10/05/2021 0919   CL 96 (L) 10/05/2021 0919   CO2 33 (H) 10/05/2021 0919   BUN 15 10/05/2021 0919   CREATININE 0.70 10/05/2021 0919      Component Value Date/Time   CALCIUM 9.8 10/05/2021 0919   ALKPHOS 65 10/05/2021 0919   AST 35 10/05/2021 0919   ALT 44 10/05/2021 0919   BILITOT 1.0 10/05/2021 0919       Impression and Plan: Valerie Cline is a very pleasant 65 yo caucasian female with small lymphocytic B cell non-Hodgkin's lymphoma. She completed 6 cycles of Rituxan/Bendamustine in September 2021 and is now on maintenance Rituxan therapy.  We will proceed with treatment today as planned per MD.  She was given 40 meq KDUR prior to treatment and will get a second dose before she leaves. She will then resume her potassium supplement at home.  Follow-up in 3 months.   Lottie Dawson, NP 12/6/202210:25 AM

## 2021-10-06 ENCOUNTER — Telehealth: Payer: Self-pay | Admitting: *Deleted

## 2021-10-06 NOTE — Telephone Encounter (Signed)
Per 10/05/21 los - called and gave upcoming appointments - confirmed

## 2021-10-18 ENCOUNTER — Other Ambulatory Visit: Payer: Self-pay | Admitting: Neurological Surgery

## 2021-10-27 ENCOUNTER — Ambulatory Visit
Admission: RE | Admit: 2021-10-27 | Discharge: 2021-10-27 | Disposition: A | Discharge status: Home / Self Care | Payer: Medicare Other | Source: Ambulatory Visit | Attending: Internal Medicine | Admitting: Internal Medicine

## 2021-10-27 DIAGNOSIS — Z1231 Encounter for screening mammogram for malignant neoplasm of breast: Secondary | ICD-10-CM

## 2021-11-15 NOTE — Progress Notes (Signed)
Surgical Instructions    Your procedure is scheduled on 11/19/21.  Report to [facility] Main Entrance "A" at 9:00 A.M., then check in with the Admitting office.  Call this number if you have problems the morning of surgery:  [phone]   If you have any questions prior to your surgery date call [phone]: Open Monday-Friday 8am-4pm    Remember:  Do not eat or drink after midnight the night before your surgery     Take these medicines the morning of surgery with A SIP OF WATER: amLODipine (NORVASC) famciclovir [facility])   As of today, STOP taking any Aspirin (unless otherwise instructed by your surgeon) Aleve, Naproxen, Ibuprofen, Motrin, Advil, Goody's, BC's, all herbal medications, fish oil, and all vitamins.  WHAT DO I DO ABOUT MY DIABETES MEDICATION?   Do not take oral diabetes medicines (pills) the morning of surgery.  DO NOT take Farxiga day before surgery or day of surgery.  Call Primary Care Physician or Endocrinologist regarding dose reduction of Humulin R 500.  The day of surgery, do not take other diabetes injectables, including Byetta (exenatide), Bydureon (exenatide ER), Victoza (liraglutide), or Trulicity (dulaglutide).  If your CBG is greater than 220 mg/dL, you may take  of your sliding scale (correction) dose of insulin.   HOW TO MANAGE YOUR DIABETES BEFORE AND AFTER SURGERY  Why is it important to control my blood sugar before and after surgery? Improving blood sugar levels before and after surgery helps healing and can limit problems. A way of improving blood sugar control is eating a healthy diet by:  Eating less sugar and carbohydrates  Increasing activity/exercise  Talking with your doctor about reaching your blood sugar goals High blood sugars (greater than 180 mg/dL) can raise your risk of infections and slow your recovery, so you will need to focus on controlling your diabetes during the weeks before surgery. Make sure that the doctor who  takes care of your diabetes knows about your planned surgery including the date and location.  How do I manage my blood sugar before surgery? Check your blood sugar at least 4 times a day, starting 2 days before surgery, to make sure that the level is not too high or low.  Check your blood sugar the morning of your surgery when you wake up and every 2 hours until you get to the Short Stay unit.  If your blood sugar is less than 70 mg/dL, you will need to treat for low blood sugar: Do not take insulin. Treat a low blood sugar (less than 70 mg/dL) with  cup of clear juice (cranberry or apple), 4 glucose tablets, OR glucose gel. Recheck blood sugar in 15 minutes after treatment (to make sure it is greater than 70 mg/dL). If your blood sugar is not greater than 70 mg/dL on recheck, call [phone] for further instructions. Report your blood sugar to the short stay nurse when you get to Short Stay.  If you are admitted to the hospital after surgery: Your blood sugar will be checked by the staff and you will probably be given insulin after surgery (instead of oral diabetes medicines) to make sure you have good blood sugar levels. The goal for blood sugar control after surgery is 80-180 mg/dL.    After your COVID test   You are not required to quarantine however you are required to wear a well-fitting mask when you are out and around people not in your household.  If your mask becomes wet or soiled, replace  with a new one.  Wash your hands often with soap and water for 20 seconds or clean your hands with an alcohol-based hand sanitizer that contains at least 60% alcohol.  Do not share personal items.  Notify your provider: if you are in close contact with someone who has COVID  or if you develop a fever of 100.4 or greater, sneezing, cough, sore throat, shortness of breath or body aches.           Do not wear jewelry or makeup Do not wear lotions, powders, perfumes or deodorant. Do not  shave 48 hours prior to surgery.   Do not bring valuables to the hospital. DO Not wear nail polish, gel polish, artificial nails, or any other type of covering on natural nails including finger and toenails. If patients have artificial nails, gel coating, etc. that need to be removed by a nail salon, please have this removed prior to surgery or surgery may need to be canceled/delayed if the surgeon/ anesthesia feels like the patient is unable to be adequately monitored.             [facility] is not responsible for any belongings or valuables.  Do NOT Smoke (Tobacco/Vaping)  24 hours prior to your procedure  If you use a CPAP at night, you may bring your mask for your overnight stay.   Contacts, glasses, hearing aids, dentures or partials may not be worn into surgery, please bring cases for these belongings   For patients admitted to the hospital, discharge time will be determined by your treatment team.   Patients discharged the day of surgery will not be allowed to drive home, and someone needs to stay with them for 24 hours.  NO VISITORS WILL BE ALLOWED IN PRE-OP WHERE PATIENTS ARE PREPPED FOR SURGERY.  ONLY 1 SUPPORT PERSON MAY BE PRESENT IN THE WAITING ROOM WHILE YOU ARE IN SURGERY.  IF YOU ARE TO BE ADMITTED, ONCE YOU ARE IN YOUR ROOM YOU WILL BE ALLOWED TWO (2) VISITORS. 1 (ONE) VISITOR MAY STAY OVERNIGHT BUT MUST ARRIVE TO THE ROOM BY 8pm.  Minor children may have two parents present. Special consideration for safety and communication needs will be reviewed on a case by case basis.  Special instructions:    Oral Hygiene is also important to reduce your risk of infection.  Remember - BRUSH YOUR TEETH THE MORNING OF SURGERY WITH YOUR REGULAR TOOTHPASTE   [facility]- Preparing For Surgery  Before surgery, you can play an important role. Because skin is not sterile, your skin needs to be as free of germs as possible. You can reduce the number of germs on your skin by washing with CHG  (chlorahexidine gluconate) Soap before surgery.  CHG is an antiseptic cleaner which kills germs and bonds with the skin to continue killing germs even after washing.     Please do not use if you have an allergy to CHG or antibacterial soaps. If your skin becomes reddened/irritated stop using the CHG.  Do not shave (including legs and underarms) for at least 48 hours prior to first CHG shower. It is OK to shave your face.  Please follow these instructions carefully.     Shower the NIGHT BEFORE SURGERY and the MORNING OF SURGERY with CHG Soap.   If you chose to wash your hair, wash your hair first as usual with your normal shampoo. After you shampoo, rinse your hair and body thoroughly to remove the shampoo.  Then [employer]  and genitals (private parts) with your normal soap and rinse thoroughly to remove soap.  After that Use CHG Soap as you would any other liquid soap. You can apply CHG directly to the skin and wash gently with a scrungie or a clean washcloth.   Apply the CHG Soap to your body ONLY FROM THE NECK DOWN.  Do not use on open wounds or open sores. Avoid contact with your eyes, ears, mouth and genitals (private parts). Wash Face and genitals (private parts)  with your normal soap.   Wash thoroughly, paying special attention to the area where your surgery will be performed.  Thoroughly rinse your body with warm water from the neck down.  DO NOT shower/wash with your normal soap after using and rinsing off the CHG Soap.  Pat yourself dry with a CLEAN TOWEL.  Wear CLEAN PAJAMAS to bed the night before surgery  Place CLEAN SHEETS on your bed the night before your surgery  DO NOT SLEEP WITH PETS.   Day of Surgery:  Take a shower with CHG soap. Wear Clean/Comfortable clothing the morning of surgery Do not apply any deodorants/lotions.   Remember to brush your teeth WITH YOUR REGULAR TOOTHPASTE.   Please read over the following fact sheets that you were given.

## 2021-11-16 ENCOUNTER — Other Ambulatory Visit: Payer: Self-pay

## 2021-11-16 ENCOUNTER — Encounter (HOSPITAL_COMMUNITY): Payer: Self-pay

## 2021-11-16 ENCOUNTER — Encounter (HOSPITAL_COMMUNITY)
Admission: RE | Admit: 2021-11-16 | Discharge: 2021-11-16 | Disposition: A | Discharge status: Home / Self Care | Payer: Medicare Other | Source: Ambulatory Visit | Attending: Neurological Surgery | Admitting: Neurological Surgery

## 2021-11-16 VITALS — BP 152/64 | HR 84 | Temp 98.3°F | Resp 18 | Ht 66.0 in | Wt 180.1 lb

## 2021-11-16 DIAGNOSIS — I10 Essential (primary) hypertension: Secondary | ICD-10-CM | POA: Insufficient documentation

## 2021-11-16 DIAGNOSIS — E782 Mixed hyperlipidemia: Secondary | ICD-10-CM | POA: Insufficient documentation

## 2021-11-16 DIAGNOSIS — Z7982 Long term (current) use of aspirin: Secondary | ICD-10-CM | POA: Diagnosis not present

## 2021-11-16 DIAGNOSIS — I2510 Atherosclerotic heart disease of native coronary artery without angina pectoris: Secondary | ICD-10-CM | POA: Diagnosis not present

## 2021-11-16 DIAGNOSIS — C9110 Chronic lymphocytic leukemia of B-cell type not having achieved remission: Secondary | ICD-10-CM | POA: Insufficient documentation

## 2021-11-16 DIAGNOSIS — Z794 Long term (current) use of insulin: Secondary | ICD-10-CM | POA: Diagnosis not present

## 2021-11-16 DIAGNOSIS — Z79899 Other long term (current) drug therapy: Secondary | ICD-10-CM | POA: Insufficient documentation

## 2021-11-16 DIAGNOSIS — Z01812 Encounter for preprocedural laboratory examination: Secondary | ICD-10-CM | POA: Insufficient documentation

## 2021-11-16 DIAGNOSIS — E119 Type 2 diabetes mellitus without complications: Secondary | ICD-10-CM | POA: Insufficient documentation

## 2021-11-16 DIAGNOSIS — U071 COVID-19: Secondary | ICD-10-CM | POA: Diagnosis not present

## 2021-11-16 DIAGNOSIS — Z8616 Personal history of COVID-19: Secondary | ICD-10-CM | POA: Diagnosis not present

## 2021-11-16 DIAGNOSIS — Z01818 Encounter for other preprocedural examination: Secondary | ICD-10-CM

## 2021-11-16 HISTORY — DX: Nausea with vomiting, unspecified: R11.2

## 2021-11-16 HISTORY — DX: Other specified postprocedural states: Z98.890

## 2021-11-16 HISTORY — DX: Other complications of anesthesia, initial encounter: T88.59XA

## 2021-11-16 LAB — CBC
HCT: 43.7 % (ref 36.0–46.0)
Hemoglobin: 14 g/dL (ref 12.0–15.0)
MCH: 25.3 pg — ABNORMAL LOW (ref 26.0–34.0)
MCHC: 32 g/dL (ref 30.0–36.0)
MCV: 78.9 fL — ABNORMAL LOW (ref 80.0–100.0)
Platelets: 233 10*3/uL (ref 150–400)
RBC: 5.54 MIL/uL — ABNORMAL HIGH (ref 3.87–5.11)
RDW: 14.5 % (ref 11.5–15.5)
WBC: 8.6 10*3/uL (ref 4.0–10.5)
nRBC: 0 % (ref 0.0–0.2)

## 2021-11-16 LAB — BASIC METABOLIC PANEL
Anion gap: 11 (ref 5–15)
BUN: 8 mg/dL (ref 8–23)
CO2: 28 mmol/L (ref 22–32)
Calcium: 9.7 mg/dL (ref 8.9–10.3)
Chloride: 100 mmol/L (ref 98–111)
Creatinine, Ser: 0.75 mg/dL (ref 0.44–1.00)
GFR, Estimated: >60 mL/min (ref >60)
Glucose, Bld: 209 mg/dL — ABNORMAL HIGH (ref 70–99)
Potassium: 3.4 mmol/L — ABNORMAL LOW (ref 3.5–5.1)
Sodium: 139 mmol/L (ref 135–145)

## 2021-11-16 LAB — SARS CORONAVIRUS 2 (TAT 6-24 HRS): SARS Coronavirus 2: POSITIVE — AB

## 2021-11-16 LAB — SURGICAL PCR SCREEN
MRSA, PCR: NEGATIVE
Staphylococcus aureus: NEGATIVE

## 2021-11-16 LAB — TYPE AND SCREEN
ABO/RH(D): A POS
Antibody Screen: NEGATIVE

## 2021-11-16 LAB — HEMOGLOBIN A1C
Hgb A1c MFr Bld: 7.3 % — ABNORMAL HIGH (ref 4.8–5.6)
Mean Plasma Glucose: 162.81 mg/dL

## 2021-11-16 LAB — GLUCOSE, CAPILLARY: Glucose-Capillary: 224 mg/dL — ABNORMAL HIGH (ref 70–99)

## 2021-11-16 NOTE — Progress Notes (Addendum)
PCP: Marton Redwood, MD Cardiologist: Jyl Heinz, MD Endocinologist: Delrae Rend, MD.  Patient to call Dr. Buddy Duty with Humulin R500 instructions.   EKG: 03/16/21 CXR: 11/08/19 ECHO: 06/18/20 Stress Test: 06/09/20 Cardiac Cath: denies  Fasting Blood Sugar- 115-200 Checks Blood Sugar_2-3__ times a day BG 224 at PAT  ASA: Last dose 11/12/21 Blood Thinner: No  OSA/CPAP: No  Covid test 11/16/21 at PAT  Anesthesia Review: Yes, cardiac history  Patient denies shortness of breath, fever, cough, and chest pain at PAT appointment.  Patient verbalized understanding of instructions provided today at the PAT appointment.  Patient asked to review instructions at home and day of surgery.

## 2021-11-17 NOTE — Progress Notes (Signed)
Follow up from pre-op covid test. Dr Ronnald Ramp notified of positive result and need to reschedule 10 days from today unless case is deemed urgent/emergent

## 2021-11-17 NOTE — Progress Notes (Addendum)
Anesthesia Chart Review:  Case: [id number] Date/Time: 11/19/21 1046   Procedure: PLIF - L1-L2 - L2-L3 (Back)   Anesthesia type: General   Pre-op diagnosis: Stenosis   Location: [facility] OR ROOM 20 / [facility]   Surgeons: Eustace Moore, MD       DISCUSSION: Patient is a 66 year old female scheduled for the above procedure. 11/16/21 presurgical COVID-19 test was positive, so case is being rescheduled from 11/19/21 to 12/08/21.   History includes never smoker, postoperative N/V, HTN, HLD, carotid artery disease (mild, 1-39% BICA 2019), DM2, coronary calcifications (elevated coronary Ca++ score 04/30/20, low risk stress test 06/09/20), chronic lymphocytic leukemia/small lymphocytic lymphoma (diagnosed by 12/20/19 left supraclavicular LN biopsy; started on Rituxan/Bendamustine 02/13/20 for small lymphocytic B cell non-Hodgkin lymphoma; on Rituxan maintenance Q 3 months since 09/03/20), COVID-19 (11/08/19 with 3 day hospitalization)  Preoperative cardiology input outlined on 09/30/21 by Sande Rives, PA-C, "Patient was last seen by Dr. Geraldo Pitter in 02/2021 at which time she was doing well. Patient was contacted today for further pre-op evaluation and reported doing well since last visit. She denies any chest pain, shortness of breath, orthopnea/PND, significant palpitations, lightheadedness/dizziness, or syncope. Her activity is limited some by her back pain but she is still able to complete >4.0 METS without any problems. Given past medical history and time since last visit, based on ACC/AHA guidelines, Valerie Cline would be at acceptable risk for the planned procedure without further cardiovascular testing.    Patient has never had a MI, coronary intervention, or CVA. She is on Aspirin due to coronary calcifications noted on prior CT. Therefore, OK to hold this for 5-7 days if needed prior to surgery. Please restart Aspirin as soon as safely possible postoperatively."   Last oncology visit 10/05/21 with Lottie Dawson, NP  and had Rituxan infusion. She noted some fatigue at times, but was overall doing well. She had run out of potassium supplement, and K 2.9 which was supplemented and . Hypokalemia supplemented.   :abs on 11/16/21 reviewed. K 3.4, Cr 0.75, A1c 7.3%, WBC 8.6, H/H 14.0/43.7, PLT 233K. LFTs normal on 10/05/21. Since surgery is being rescheduled for 12/08/21, she will at least need a new T&S prior to surgery. Also surgeon had ordered a INR, but blue tube could not be located by the Lab so need to day day of surgery if not done before. As of 11/17/19, last ASA 11/12/21.   Anesthesia team to evaluate on the day of surgery.   VS: BP (!) 152/64    Pulse 84    Temp 36.8 C (Oral)    Resp 18    Ht _0  (1.676 m)    Wt 81.7 kg    SpO2 100%    BMI 29.07 kg/m    PROVIDERS: Ginger Organ., MD is PCP  - Jyl Heinz, MD is cardiologist. Established 06/01/20 following elevated coronary calcium score of 1563 (99 percentile for age and sex matched control) and severe MAC. She subsequently had a low risk stress test on 06/09/20, and echo on 06/18/20 showed normal LVEF, no MR or MS, mild-moderate aortic sclerosis without stenosis.  She has been managed medically with risk factor modification. Last visit 03/16/21. Next routine visit is scheduled for 11/30/20.  Burney Gauze, MD is HEM-ONC Delrae Rend, MD is endocrinologist   LABS: Preoperative labs noted.  (all labs ordered are listed, but only abnormal results are displayed)  Labs Reviewed  SARS CORONAVIRUS 2 (TAT 6-24 HRS) - Abnormal; Notable  for the following components:      Result Value   SARS Coronavirus 2 POSITIVE (*)    All other components within normal limits  GLUCOSE, CAPILLARY - Abnormal; Notable for the following components:   Glucose-Capillary 224 (*)    All other components within normal limits  CBC - Abnormal; Notable for the following components:   RBC 5.54 (*)    MCV 78.9 (*)    MCH 25.3 (*)    All other components within normal  limits  BASIC METABOLIC PANEL - Abnormal; Notable for the following components:   Potassium 3.4 (*)    Glucose, Bld 209 (*)    All other components within normal limits  HEMOGLOBIN A1C - Abnormal; Notable for the following components:   Hgb A1c MFr Bld 7.3 (*)    All other components within normal limits  SURGICAL PCR SCREEN  TYPE AND SCREEN    IMAGES: US Thyroid 09/29/21: IMPRESSION: 1. Again seen is the solitary left thyroid nodule which remains similar in morphology. Differences in measurements on today's exam are presumably related to differences in imaging technique. It is not felt to have significantly changed. This was previously biopsied in 2021, correlate with biopsy results. 2. No new thyroid nodules. - 02/05/20 left mid lob thyroid FNA: cytology consistent with benign folliculr nodule (Bethesda category II)  CT L-spine 08/25/21: IMPRESSION: 1. Grade 2 anterolisthesis at L5-S1 with dynamic instability due to chronic L5 pars defects. Resultant moderate to severe spinal canal and severe bilateral neuroforaminal stenosis. 2. Unchanged moderate spinal canal and left neuroforaminal stenosis at L1-L2. 3. Unchanged moderate right neuroforaminal stenosis at L2-L3 and L4-L5. 4. Aortic Atherosclerosis (ICD10-I70.0).   MRI L-spine 07/28/21: IMPRESSION: 1. Diffuse lumbar spine spondylosis as described above. [See full report] 2.  No acute osseous injury of the lumbar spine.   CT soft tissue neck & CT chest/abd/pelvis 09/03/20: IMPRESSION: Significant improvement in lymphadenopathy since 02/03/2020. Unchanged appearance of thyroid; previously biopsied April 2021. Severe canal stenosis at C4-C5.    EKG: 03/16/21: NSR. Non-specific ST abnormality.   CV: Echo 06/18/20: IMPRESSIONS   1. Left ventricular ejection fraction, by estimation, is 60 to 65%. The  left ventricle has normal function. The left ventricle has no regional  wall motion abnormalities. Left ventricular  diastolic parameters are  consistent with Grade I diastolic  dysfunction (impaired relaxation).   2. Right ventricular systolic function is normal. The right ventricular  size is normal. There is normal pulmonary artery systolic pressure.   3. The mitral valve is normal in structure. No evidence of mitral valve  regurgitation. No evidence of mitral stenosis.   4. The aortic valve is normal in structure. Aortic valve regurgitation is  trivial. Mild to moderate aortic valve sclerosis/calcification is present,  without any evidence of aortic stenosis.   5. The inferior vena cava is normal in size with greater than 50%  respiratory variability, suggesting right atrial pressure of 3 mmHg.    Nuclear stress test 06/09/20: The left ventricular ejection fraction is hyperdynamic (>65%). Nuclear stress EF: 72%. There was no ST segment deviation noted during stress. The study is normal. This is a low risk study.   CT Coronary Calcium Scoring 04/30/20: IMPRESSION: - Coronary calcium score of 1563 with significant calcification in the left anterior descending artery . This was 46 percentile for age and sex matched control. - There is severe mitral annular calcification.    US Carotid 01/08/18: Final Interpretation:  - Right Carotid: Velocities in the right ICA  are consistent with a 1-39%  stenosis.  - Left Carotid: Velocities in the left ICA are consistent with a 1-39%  stenosis.  - Vertebrals: Both vertebral arteries were patent with antegrade flow.    Past Medical History:  Diagnosis Date   Allergy    Blood in stool    Carotid stenosis    Cervical lymphadenopathy 10/31/2019   CLL (chronic lymphocytic leukemia) ([place]) 33/43/5686   Complication of anesthesia    Coronary artery calcification seen on CT scan 06/01/2020   Diabetes mellitus without complication (HCC)    Elevated liver function tests    Essential hypertension 11/08/2019   External hemorrhoids without mention of  complication    Goals of care, counseling/discussion 10/28/2019   Hyperglycemia due to type 2 diabetes mellitus ([place]) 03/09/2021   Hyperlipidemia    Hypertension    Long term (current) use of insulin ([place]) 03/09/2021   Lymphadenopathy, abdominal 10/28/2019   Mixed dyslipidemia 06/01/2020   Pneumonia due to COVID-19 virus 11/08/2019   PONV (postoperative nausea and vomiting)    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 ([place]) 12/30/2019   T2DM (type 2 diabetes mellitus) ([place]) 11/08/2019   Type 2 diabetes mellitus with unspecified complications ([place]) 16/83/7290    Past Surgical History:  Procedure Laterality Date   BREAST BIOPSY Right    needle biopsy   COLONOSCOPY     POLYPECTOMY     Ruptured disc      MEDICATIONS:  amLODipine (NORVASC) 2.5 MG tablet   aspirin EC 81 MG tablet   benazepril (LOTENSIN) 20 MG tablet   Dulaglutide 3 MG/0.5ML SOPN   famciclovir (FAMVIR) 500 MG tablet   hydrochlorothiazide (HYDRODIURIL) 25 MG tablet   insulin regular human CONCENTRATED (HUMULIN R) 500 UNIT/ML injection   Multiple Vitamins-Minerals (MULTIVITAMIN WITH MINERALS) tablet   Omega-3 Fatty Acids (FISH OIL) 1000 MG CAPS   Potassium 99 MG TABS   rosuvastatin (CRESTOR) 10 MG tablet   Vitamin D3 (VITAMIN D) 25 MCG tablet   No current facility-administered medications for this encounter.    Myra Gianotti, PA-C Surgical Short Stay/Anesthesiology [facility] Phone [phone] [facility] Phone [phone] 11/18/2021 10:03 AM

## 2021-11-18 NOTE — Anesthesia Preprocedure Evaluation (Addendum)
Anesthesia Evaluation  Patient identified by MRN, date of birth, ID band Patient awake    Reviewed: Allergy & Precautions, NPO status , Patient's Chart, lab work & pertinent test results  History of Anesthesia Complications (+) PONV and history of anesthetic complications  Airway Mallampati: II  TM Distance: >3 FB Neck ROM: Full    Dental no notable dental hx. (+) Dental Advisory Given   Pulmonary neg pulmonary ROS,    Pulmonary exam normal        Cardiovascular hypertension, Pt. on medications Normal cardiovascular exam  Nuclear Stress Test 12/06/21 [facility]): Impression: 1. No reversible ischemia or infarction. 2. Normal left ventricular wall motion. 3. Left ventricular ejection fraction 82%. 4. Noninvasive restratification: Low    Neuro/Psych    GI/Hepatic negative GI ROS, Neg liver ROS,   Endo/Other  diabetes  Renal/GU negative Renal ROS     Musculoskeletal negative musculoskeletal ROS (+)   Abdominal   Peds  Hematology negative hematology ROS (+)   Anesthesia Other Findings   Reproductive/Obstetrics                          Anesthesia Physical Anesthesia Plan  ASA: 3  Anesthesia Plan: General   Post-op Pain Management: Ofirmev IV (intra-op) and Ketamine IV   Induction:   PONV Risk Score and Plan: 4 or greater and Ondansetron, Dexamethasone, Midazolam and Scopolamine patch - Pre-op  Airway Management Planned: Oral ETT  Additional Equipment:   Intra-op Plan:   Post-operative Plan: Extubation in OR  Informed Consent: I have reviewed the patients History and Physical, chart, labs and discussed the procedure including the risks, benefits and alternatives for the proposed anesthesia with the patient or authorized representative who has indicated his/her understanding and acceptance.     Dental advisory given  Plan Discussed with: Anesthesiologist and  CRNA  Anesthesia Plan Comments: (PAT note written 11/18/2021 by Myra Gianotti, PA-C. Surgery rescheduled to 12/08/21 due to + COVID-19 test on 11/16/21. Will need updated T&S.  )      Anesthesia Quick Evaluation

## 2021-11-30 ENCOUNTER — Ambulatory Visit: Payer: Medicare Other | Admitting: Cardiology

## 2021-11-30 ENCOUNTER — Other Ambulatory Visit: Payer: Self-pay

## 2021-11-30 VITALS — BP 142/60 | HR 94 | Ht 66.0 in | Wt 179.6 lb

## 2021-11-30 DIAGNOSIS — Z01810 Encounter for preprocedural cardiovascular examination: Secondary | ICD-10-CM

## 2021-11-30 DIAGNOSIS — T8859XA Other complications of anesthesia, initial encounter: Secondary | ICD-10-CM | POA: Insufficient documentation

## 2021-11-30 DIAGNOSIS — I2510 Atherosclerotic heart disease of native coronary artery without angina pectoris: Secondary | ICD-10-CM

## 2021-11-30 DIAGNOSIS — E782 Mixed hyperlipidemia: Secondary | ICD-10-CM | POA: Diagnosis not present

## 2021-11-30 DIAGNOSIS — M5416 Radiculopathy, lumbar region: Secondary | ICD-10-CM

## 2021-11-30 DIAGNOSIS — I10 Essential (primary) hypertension: Secondary | ICD-10-CM | POA: Diagnosis not present

## 2021-11-30 DIAGNOSIS — R209 Unspecified disturbances of skin sensation: Secondary | ICD-10-CM | POA: Insufficient documentation

## 2021-11-30 DIAGNOSIS — R112 Nausea with vomiting, unspecified: Secondary | ICD-10-CM | POA: Insufficient documentation

## 2021-11-30 DIAGNOSIS — E118 Type 2 diabetes mellitus with unspecified complications: Secondary | ICD-10-CM

## 2021-11-30 HISTORY — DX: Radiculopathy, lumbar region: M54.16

## 2021-11-30 NOTE — Progress Notes (Signed)
Cardiology Office Note:    Date:  11/30/2021   ID:  Valerie Cline, DOB 07-12-1956, MRN [id number]  PCP:  Valerie Cline., MD  Cardiologist:  Valerie Lindau, MD   Referring MD: Valerie Cline., MD    ASSESSMENT:    1. Atherosclerosis of native coronary artery of native heart without angina pectoris   2. Preop cardiovascular exam   3. Essential hypertension   4. Mixed hyperlipidemia   5. Type 2 diabetes mellitus with unspecified complications (HCC)    PLAN:    In order of problems listed above:  Coronary artery calcification: Secondary prevention stressed with the patient.  Importance of compliance with diet medication stressed she vocalized understanding. Preoperative cardiovascular risk stratification: As patient has multiple risk factors for coronary artery disease and his coronary artery calcification history we will do a Lexiscan sestamibi to assess for any obstructive coronary artery disease.  She is agreeable. Essential hypertension: Blood pressure stable and diet was emphasized.  Lifestyle modification urged. Mixed dyslipidemia: On statin therapy managed by primary care. Diabetes mellitus: Managed by primary care provider.  Diet was emphasized.  Lifestyle modification urged If the stress test is negative she is not at high risk for coronary events during the aforementioned surgery.  Meticulous hemodynamic monitoring will further reduce risk of coronary events.  Echocardiogram from 2021 was unremarkable and discussed with her. Patient will be seen in follow-up appointment in 6 months or earlier if the patient has any concerns    Medication Adjustments/Labs and Tests Ordered: Current medicines are reviewed at length with the patient today.  Concerns regarding medicines are outlined above.  Orders Placed This Encounter  Procedures   Basic metabolic panel   MYOCARDIAL PERFUSION IMAGING   No orders of the defined types were placed in this encounter.    No chief  complaint on file.    History of Present Illness:    Valerie Cline is a 66 y.o. female.  Patient has past medical history of essential hypertension, dyslipidemia and atherosclerosis of coronary arteries and diabetes mellitus.  She is planning to undergo orthopedic surgery.  She denies any chest pain orthopnea or PND but because of the same reason she leads a sedentary lifestyle.  She is here for preop assessment.  Past Medical History:  Diagnosis Date   Allergy    Atherosclerotic heart disease of native coronary artery without angina pectoris 01/13/2020   Blood in stool    Carotid artery occlusion 08/13/2009   Carotid stenosis    Cervical lymphadenopathy 10/31/2019   CLL (chronic lymphocytic leukemia) ([place]) 53/66/4403   Complication of anesthesia    Coronary artery calcification seen on CT scan 06/01/2020   Diabetes mellitus without complication ([place])    Diabetic peripheral neuropathy associated with type 2 diabetes mellitus ([place]) 06/08/2009   Elevated liver function tests    Essential hypertension 11/08/2019   Fatty liver 09/06/2012   Goals of care, counseling/discussion 10/28/2019   Hardening of the aorta (main artery of the heart) ([place]) 01/13/2020   History of adenomatous polyp of colon 03/16/2021   Hyperglycemia due to type 2 diabetes mellitus ([place]) 03/09/2021   Hyperlipidemia    Hypertension    Hypertriglyceridemia 03/16/2021   Long term (current) use of insulin ([place]) 03/09/2021   Lumbar radiculopathy 11/30/2021   Lymphadenopathy, abdominal 10/28/2019   Malignant lymphoma, small lymphocytic ([place]) 11/13/2019   Mixed dyslipidemia 06/01/2020   Obesity 04/19/2012   Personal history of COVID-19 11/13/2019   Pneumonia due  to COVID-19 virus 11/08/2019   PONV (postoperative nausea and vomiting)    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 Swallow whole.   benazepril (LOTENSIN) 20 MG tablet Take 20 mg by mouth at bedtime.   Dulaglutide 3 MG/0.5ML SOPN Inject 3 mg into the skin once a week.   famciclovir (FAMVIR) 500 MG tablet Take 1 tablet (500 mg total) by mouth daily.   hydrochlorothiazide (HYDRODIURIL) 25 MG tablet Take 25 mg by mouth at bedtime.   insulin regular human CONCENTRATED (HUMULIN R) 500 UNIT/ML injection Inject 50-90 Units into the skin See admin instructions. Inject 80 units subcutaneously before breakfast, and 50 units before lunch and 90 for supper   Multiple Vitamins-Minerals (MULTIVITAMIN WITH MINERALS) tablet Take 1 tablet by mouth daily.   Potassium 99 MG TABS Take 99 mg by mouth daily.   rosuvastatin (CRESTOR) 10 MG tablet Take 10 mg by mouth at bedtime.   Vitamin D3 (VITAMIN D) 25 MCG tablet Take 1,000 Units by mouth daily.     Allergies:   Canagliflozin, Glucophage [metformin], and Niaspan [niacin]   Social History   Socioeconomic History   Marital status: Married    Spouse name: Not on file   Number of children: 1   Years of education: Not on file   Highest education level: Not on file  Occupational History   Occupation: Engineer, maintenance (IT)  Tobacco Use   Smoking status: Never   Smokeless tobacco: Never  Vaping Use   Vaping Use: Never used  Substance and Sexual Activity   Alcohol use: No   Drug use: No   Sexual activity: Not on file  Other Topics Concern   Not on file  Social History Narrative   Not on file   Social Determinants of Health   Financial Resource Strain: Not on file  Food Insecurity: Not on file   Transportation Needs: Not on file  Physical Activity: Not on file  Stress: Not on file  Social Connections: Not on file     Family History: The patient's family history is negative for Colon cancer, Esophageal cancer, Rectal cancer, Stomach cancer, and Pancreatic cancer.  ROS:   Please see the history of present illness.    All other systems reviewed and are negative.  EKGs/Labs/Other Studies Reviewed:    The following studies were reviewed today: EKG reveals sinus rhythm and nonspecific ST-T changes   Recent Labs: 10/05/2021: ALT 44 11/16/2021: BUN 8; Creatinine, Ser 0.75; Hemoglobin 14.0; Platelets 233; Potassium 3.4; Sodium 139  Recent Lipid Panel    Component Value Date/Time   TRIG 222 (H) 11/08/2019 1804    Physical Exam:    VS:  BP (!) 142/60    Pulse 94    Ht 5\' 6"  (1.676 m)    Wt 179 lb 9.6 oz (81.5 kg)    SpO2 94%    BMI 28.99 kg/m  Wt Readings from Last 3 Encounters:  11/30/21 179 lb 9.6 oz (81.5 kg)  11/16/21 180 lb 1.6 oz (81.7 kg)  10/05/21 179 lb 1.9 oz (81.2 kg)     GEN: Patient is in no acute distress HEENT: Normal NECK: No JVD; No carotid bruits LYMPHATICS: No lymphadenopathy CARDIAC: Hear sounds regular, 2/6 systolic murmur at the apex. RESPIRATORY:  Clear to auscultation without rales, wheezing or rhonchi  ABDOMEN: Soft, non-tender, non-distended MUSCULOSKELETAL:  No edema; No deformity  SKIN: Warm and dry NEUROLOGIC:  Alert and oriented x 3 PSYCHIATRIC:  Normal affect   Signed, Valerie Lindau, MD  11/30/2021 4:31 PM    [facility]

## 2021-11-30 NOTE — Patient Instructions (Addendum)
Medication Instructions:  Your physician recommends that you continue on your current medications as directed. Please refer to the Current Medication list given to you today.   *If you need a refill on your cardiac medications before your next appointment, please call your pharmacy*   Lab Work: Your physician recommends that you have a BMET today in the office.  If you have labs (blood work) drawn today and your tests are completely normal, you will receive your results only by: [place] (if you have MyChart) OR A paper copy in the mail If you have any lab test that is abnormal or we need to change your treatment, we will call you to review the results.   Testing/Procedures: Your physician has requested that you have a lexiscan myoview. For further information please visit [email or web address]. Please follow instruction sheet, as given.  The test will be done at [facility]. Nothing to eat or drink after midnight. Take your medications with you to the test if they need you to take them you can use your own.     Follow-Up: At [facility], you and your health needs are our priority.  As part of our continuing mission to provide you with exceptional heart care, we have created designated Provider Care Teams.  These Care Teams include your primary Cardiologist (physician) and Advanced Practice Providers (APPs -  Physician Assistants and Nurse Practitioners) who all work together to provide you with the care you need, when you need it.  We recommend signing up for the patient portal called "MyChart".  Sign up information is provided on this After Visit Summary.  MyChart is used to connect with patients for Virtual Visits (Telemedicine).  Patients are able to view lab/test results, encounter notes, upcoming appointments, etc.  Non-urgent messages can be sent to your provider as well.   To learn more about what you can do with MyChart, go to [email or web address].    Your next  appointment:   6 month(s)  The format for your next appointment:   In Person  Provider:   Jyl Heinz, MD   Other Instructions Cardiac Nuclear Scan A cardiac nuclear scan is a test that is done to check the flow of blood to your heart. It is done when you are resting and when you are exercising. The test looks for problems such as: Not enough blood reaching a portion of the heart. The heart muscle not working as it should. You may need this test if: You have heart disease. You have had lab results that are not normal. You have had heart surgery or a balloon procedure to open up blocked arteries (angioplasty). You have chest pain. You have shortness of breath. In this test, a special dye (tracer) is put into your bloodstream. The tracer will travel to your heart. A camera will then take pictures of your heart to see how the tracer moves through your heart. This test is usually done at a hospital and takes 2-4 hours. Tell a doctor about: Any allergies you have. All medicines you are taking, including vitamins, herbs, eye drops, creams, and over-the-counter medicines. Any problems you or family members have had with anesthetic medicines. Any blood disorders you have. Any surgeries you have had. Any medical conditions you have. Whether you are pregnant or may be pregnant. What are the risks? Generally, this is a safe test. However, problems may occur, such as: Serious chest pain and heart attack. This is only a risk if the stress portion of the  test is done. Rapid heartbeat. A feeling of warmth in your chest. This feeling usually does not last long. Allergic reaction to the tracer. What happens before the test? Ask your doctor about changing or stopping your normal medicines. This is important. Follow instructions from your doctor about what you cannot eat or drink. Remove your jewelry on the day of the test. What happens during the test? An IV tube will be inserted into one  of your veins. Your doctor will give you a small amount of tracer through the IV tube. You will wait for 20-40 minutes while the tracer moves through your bloodstream. Your heart will be monitored with an electrocardiogram (ECG). You will lie down on an exam table. Pictures of your heart will be taken for about 15-20 minutes. You may also have a stress test. For this test, one of these things may be done: You will be asked to exercise on a treadmill or a stationary bike. You will be given medicines that will make your heart work harder. This is done if you are unable to exercise. When blood flow to your heart has peaked, a tracer will again be given through the IV tube. After 20-40 minutes, you will get back on the exam table. More pictures will be taken of your heart. Depending on the tracer that is used, more pictures may need to be taken 3-4 hours later. Your IV tube will be removed when the test is over. The test may vary among doctors and hospitals. What happens after the test? Ask your doctor: Whether you can return to your normal schedule, including diet, activities, and medicines. Whether you should drink more fluids. This will help to remove the tracer from your body. Drink enough fluid to keep your pee (urine) pale yellow. Ask your doctor, or the department that is doing the test: When will my results be ready? How will I get my results? Summary A cardiac nuclear scan is a test that is done to check the flow of blood to your heart. Tell your doctor whether you are pregnant or may be pregnant. Before the test, ask your doctor about changing or stopping your normal medicines. This is important. Ask your doctor whether you can return to your normal activities. You may be asked to drink more fluids. This information is not intended to replace advice given to you by your health care provider. Make sure you discuss any questions you have with your health care provider. Document  Revised: 02/06/2019 Document Reviewed: 04/02/2018 Elsevier Patient Education  [place].

## 2021-11-30 NOTE — Addendum Note (Signed)
Addended by: Jyl Heinz R on: 11/30/2021 04:44 PM   Modules accepted: Orders

## 2021-12-01 ENCOUNTER — Encounter: Payer: Self-pay | Admitting: Hematology & Oncology

## 2021-12-01 LAB — BASIC METABOLIC PANEL
BUN/Creatinine Ratio: 28 (ref 12–28)
BUN: 15 mg/dL (ref 8–27)
CO2: 27 mmol/L (ref 20–29)
Calcium: 10.1 mg/dL (ref 8.7–10.3)
Chloride: 96 mmol/L (ref 96–106)
Creatinine, Ser: 0.54 mg/dL — ABNORMAL LOW (ref 0.57–1.00)
Glucose: 384 mg/dL — ABNORMAL HIGH (ref 70–99)
Potassium: 3.7 mmol/L (ref 3.5–5.2)
Sodium: 141 mmol/L (ref 134–144)
eGFR: 101 mL/min/{1.73_m2} (ref >59)

## 2021-12-03 NOTE — Progress Notes (Addendum)
Anesthesia Follow-up:  Case: [id number] Date/Time: 12/08/21 0815   Procedure: PLIF - L1-L2 - L2-L3 (Back)   Anesthesia type: General   Pre-op diagnosis: Stenosis   Location: [facility] OR ROOM 27 / [place] OR   Surgeons: Eustace Moore, MD       DISCUSSION: Patient is a 66 year old female scheduled for the above procedure.  Surgery was initially scheduled for 11/16/2021 but her presurgical COVID-19 test was positive.  See my previous anesthesia APP note from Date of Service 11/16/21.   Since then she had a routine follow-up visit with her cardiologist Dr. Geraldo Pitter. He recommended a preoperative nuclear stress test. He added, "If the stress test is negative she is not at high risk for coronary events during the aforementioned surgery.  Meticulous hemodynamic monitoring will further reduce risk of coronary events.  Echocardiogram from 2021 was unremarkable and discussed with her."  Her stress test was done at [facility] on 12/06/21 with the following results: Impression: 1. No reversible ischemia or infarction. 2. Normal left ventricular wall motion. 3. Left ventricular ejection fraction 82%. 4. Noninvasive restratification: Low  She will need updated labs as indicated on the day of surgery. Last BMET was on 11/12/21 and CBC on 11/16/21. INR 7.3% on 11/16/21. Would anticipate at least a PT/INR (since previously ordered by Dr. Ronnald Ramp and not done with initial preoperative labs) and a repeat T&S since last specimen is expired.    She reported being off ASA for at least five days in preparation for surgery.    Myra Gianotti, PA-C Surgical Short Stay/Anesthesiology [facility] Phone [phone] [facility] Phone [phone] 12/07/2021 12:53 PM

## 2021-12-06 ENCOUNTER — Encounter: Payer: Self-pay | Admitting: Student

## 2021-12-06 DIAGNOSIS — Z01810 Encounter for preprocedural cardiovascular examination: Secondary | ICD-10-CM | POA: Diagnosis not present

## 2021-12-06 DIAGNOSIS — I2510 Atherosclerotic heart disease of native coronary artery without angina pectoris: Secondary | ICD-10-CM | POA: Diagnosis not present

## 2021-12-07 ENCOUNTER — Telehealth: Payer: Self-pay | Admitting: Cardiology

## 2021-12-07 NOTE — Telephone Encounter (Signed)
Calling to see if the patient been clear for clearance from her stress trest. Patient is schedule for tomorrow. Please advise

## 2021-12-07 NOTE — Telephone Encounter (Signed)
° °  Patient Name: Valerie Cline  DOB: 1956-08-10 MRN: [id number]  Primary Cardiologist: Dr. Jyl Heinz  Chart reviewed as part of pre-operative protocol coverage. Patient was recently seen by Dr. Geraldo Pitter for pre-op cardiovascular evaluation. Will cc his note here. Per his recommendation, "As patient has multiple risk factors for coronary artery disease and his coronary artery calcification history we will do a Lexiscan sestamibi to assess for any obstructive coronary artery disease."  Stress test was reviewed (from 12/06/21) and was negative, low risk, EF 82%. Therefore per Dr. Julien Nordmann note, " If the stress test is negative she is not at high risk for coronary events during the aforementioned surgery. Meticulous hemodynamic monitoring will further reduce risk of coronary events."  As per recent clearance note from [facility] on 09/30/21, "Patient has never had a MI, coronary intervention, or CVA. She is on Aspirin due to coronary calcifications noted on prior CT. Therefore, OK to hold this for 5-7 days if needed prior to surgery. Please restart Aspirin as soon as safely possible postoperatively." I agree with this recommendation. I relayed this to Myra Gianotti, PA with pre-surgical team, and her team will contact the patient to clarify whether she has been holding or not, and they will follow up with the surgeon if needed. I will route to callback CMA to update pt that stress test was normal and that we have granted clearance to her surgical team. Patient should expect a call from the surgical team clarifying the aspirin plan.  Will route this bundled recommendation to requesting provider via Epic fax function and remove from pre-op pool. Please call with questions.  Charlie Pitter, PA-C 12/07/2021, 11:20 AM

## 2021-12-07 NOTE — Progress Notes (Signed)
I spoke to Valerie Cline to inform her aaof arrival time for OR in am.  I also asked Valerie Cline when she stopped taking ASA, patient reported, "one day last week, at least 5 days ago."

## 2021-12-07 NOTE — Telephone Encounter (Signed)
S/w the pt and she has been notified she is cleared for her procedure. Pt aware of recommendations ok to hold ASA 5-7 days prior to procedure, though exact number days will need to come from surgeon's office. Pt aware she will resume ASA once surgeon feels it is safe s/p procedure. Pt thanked me for the call and the help. Updated notes have been faxed to requesting office.

## 2021-12-07 NOTE — Telephone Encounter (Signed)
° °  Patient Name: Valerie Cline  DOB: 14-Nov-1955 MRN: [id number]  Primary Cardiologist: Dr. Geraldo Pitter  Chart reviewed as part of pre-operative protocol coverage. Patient was seen by provider Dr. Geraldo Pitter for surgical clearance and stress test was ordered. Stress test has not yet been complete in Epic. Will route to callback team / [place] team to please look into whether there is perhaps another location for her report.  Charlie Pitter, PA-C 12/07/2021, 10:19 AM

## 2021-12-08 ENCOUNTER — Inpatient Hospital Stay (HOSPITAL_COMMUNITY)
Admission: RE | Admit: 2021-12-08 | Discharge: 2021-12-09 | DRG: 455 | Disposition: A | Discharge status: Home / Self Care | Payer: Medicare Other | Attending: Neurological Surgery | Admitting: Neurological Surgery

## 2021-12-08 ENCOUNTER — Encounter (HOSPITAL_COMMUNITY)
Admission: RE | Disposition: A | Discharge status: Home / Self Care | Payer: Self-pay | Source: Home / Self Care | Attending: Neurological Surgery

## 2021-12-08 ENCOUNTER — Inpatient Hospital Stay (HOSPITAL_COMMUNITY): Payer: Medicare Other

## 2021-12-08 ENCOUNTER — Other Ambulatory Visit: Payer: Self-pay

## 2021-12-08 ENCOUNTER — Encounter (HOSPITAL_COMMUNITY): Payer: Self-pay | Admitting: Neurological Surgery

## 2021-12-08 ENCOUNTER — Inpatient Hospital Stay (HOSPITAL_COMMUNITY): Payer: Medicare Other | Admitting: Vascular Surgery

## 2021-12-08 DIAGNOSIS — Z794 Long term (current) use of insulin: Secondary | ICD-10-CM

## 2021-12-08 DIAGNOSIS — Z981 Arthrodesis status: Secondary | ICD-10-CM

## 2021-12-08 DIAGNOSIS — Z888 Allergy status to other drugs, medicaments and biological substances status: Secondary | ICD-10-CM

## 2021-12-08 DIAGNOSIS — M4316 Spondylolisthesis, lumbar region: Secondary | ICD-10-CM | POA: Diagnosis present

## 2021-12-08 DIAGNOSIS — Z7982 Long term (current) use of aspirin: Secondary | ICD-10-CM

## 2021-12-08 DIAGNOSIS — M545 Low back pain, unspecified: Secondary | ICD-10-CM | POA: Diagnosis present

## 2021-12-08 DIAGNOSIS — M48061 Spinal stenosis, lumbar region without neurogenic claudication: Secondary | ICD-10-CM | POA: Diagnosis present

## 2021-12-08 DIAGNOSIS — M419 Scoliosis, unspecified: Secondary | ICD-10-CM | POA: Diagnosis present

## 2021-12-08 DIAGNOSIS — Z8616 Personal history of COVID-19: Secondary | ICD-10-CM | POA: Diagnosis not present

## 2021-12-08 DIAGNOSIS — Z79899 Other long term (current) drug therapy: Secondary | ICD-10-CM | POA: Diagnosis not present

## 2021-12-08 DIAGNOSIS — Z419 Encounter for procedure for purposes other than remedying health state, unspecified: Secondary | ICD-10-CM

## 2021-12-08 LAB — TYPE AND SCREEN
ABO/RH(D): A POS
Antibody Screen: NEGATIVE

## 2021-12-08 LAB — GLUCOSE, CAPILLARY
Glucose-Capillary: 129 mg/dL — ABNORMAL HIGH (ref 70–99)
Glucose-Capillary: 136 mg/dL — ABNORMAL HIGH (ref 70–99)
Glucose-Capillary: 177 mg/dL — ABNORMAL HIGH (ref 70–99)
Glucose-Capillary: 216 mg/dL — ABNORMAL HIGH (ref 70–99)
Glucose-Capillary: 218 mg/dL — ABNORMAL HIGH (ref 70–99)
Glucose-Capillary: 304 mg/dL — ABNORMAL HIGH (ref 70–99)
Glucose-Capillary: 291 mg/dL — ABNORMAL HIGH (ref 70–99)
Glucose-Capillary: 330 mg/dL — ABNORMAL HIGH (ref 70–99)

## 2021-12-08 LAB — PROTIME-INR
INR: 1 (ref 0.8–1.2)
Prothrombin Time: 13.3 seconds (ref 11.4–15.2)

## 2021-12-08 SURGERY — POSTERIOR LUMBAR FUSION 2 LEVEL
Anesthesia: General | Site: Spine Lumbar

## 2021-12-08 MED ORDER — CHLORHEXIDINE GLUCONATE 0.12 % MT SOLN: 15.0000 mL | Freq: Once | OROMUCOSAL | Status: AC

## 2021-12-08 MED ORDER — LACTATED RINGERS IV SOLN: INTRAVENOUS | Status: DC

## 2021-12-08 MED ORDER — SCOPOLAMINE 1 MG/3DAYS TD PT72
1.0000 | MEDICATED_PATCH | TRANSDERMAL | Status: DC
  Administered 2021-12-08: 1.5 mg via TRANSDERMAL
  Filled 2021-12-08: qty 1

## 2021-12-08 MED ORDER — METHOCARBAMOL 500 MG PO TABS
500.0000 mg | ORAL_TABLET | Freq: Four times a day (QID) | ORAL | Status: DC | PRN
  Administered 2021-12-08 – 2021-12-09 (×2): 500 mg via ORAL
  Filled 2021-12-08 (×2): qty 1

## 2021-12-08 MED ORDER — FENTANYL CITRATE (PF) 250 MCG/5ML IJ SOLN
INTRAMUSCULAR | Status: DC | PRN
  Administered 2021-12-08: 100 ug via INTRAVENOUS
  Administered 2021-12-08 (×3): 50 ug via INTRAVENOUS

## 2021-12-08 MED ORDER — ORAL CARE MOUTH RINSE: 15.0000 mL | Freq: Once | OROMUCOSAL | Status: AC

## 2021-12-08 MED ORDER — 0.9 % SODIUM CHLORIDE (POUR BTL) OPTIME
TOPICAL | Status: DC | PRN
  Administered 2021-12-08: 1000 mL

## 2021-12-08 MED ORDER — THROMBIN 5000 UNITS EX SOLR: OROMUCOSAL | Status: DC | PRN

## 2021-12-08 MED ORDER — SENNA 8.6 MG PO TABS
1.0000 | ORAL_TABLET | Freq: Two times a day (BID) | ORAL | Status: DC
  Administered 2021-12-08 – 2021-12-09 (×2): 8.6 mg via ORAL
  Filled 2021-12-08 (×3): qty 1

## 2021-12-08 MED ORDER — ONDANSETRON HCL 4 MG/2ML IJ SOLN
4.0000 mg | Freq: Four times a day (QID) | INTRAMUSCULAR | Status: DC | PRN
  Administered 2021-12-08: 4 mg via INTRAVENOUS
  Filled 2021-12-08: qty 2

## 2021-12-08 MED ORDER — BENAZEPRIL HCL 20 MG PO TABS
20.0000 mg | ORAL_TABLET | Freq: Every day | ORAL | Status: DC
  Filled 2021-12-08: qty 1

## 2021-12-08 MED ORDER — PHENOL 1.4 % MT LIQD: 1.0000 | OROMUCOSAL | Status: DC | PRN

## 2021-12-08 MED ORDER — PROMETHAZINE HCL 25 MG/ML IJ SOLN: 6.2500 mg | INTRAMUSCULAR | Status: DC | PRN

## 2021-12-08 MED ORDER — PROPOFOL 10 MG/ML IV BOLUS
INTRAVENOUS | Status: AC
  Filled 2021-12-08: qty 20

## 2021-12-08 MED ORDER — SUCCINYLCHOLINE CHLORIDE 200 MG/10ML IV SOSY
PREFILLED_SYRINGE | INTRAVENOUS | Status: AC
  Filled 2021-12-08: qty 10

## 2021-12-08 MED ORDER — ACETAMINOPHEN 500 MG PO TABS
1000.0000 mg | ORAL_TABLET | Freq: Once | ORAL | Status: DC
  Filled 2021-12-08: qty 2

## 2021-12-08 MED ORDER — MIDAZOLAM HCL 2 MG/2ML IJ SOLN
INTRAMUSCULAR | Status: AC
  Filled 2021-12-08: qty 2

## 2021-12-08 MED ORDER — AMISULPRIDE (ANTIEMETIC) 5 MG/2ML IV SOLN: 10.0000 mg | Freq: Once | INTRAVENOUS | Status: DC | PRN

## 2021-12-08 MED ORDER — HYDROCHLOROTHIAZIDE 25 MG PO TABS: 25.0000 mg | ORAL_TABLET | Freq: Every day | ORAL | Status: DC

## 2021-12-08 MED ORDER — LIDOCAINE 2% (20 MG/ML) 5 ML SYRINGE
INTRAMUSCULAR | Status: AC
  Filled 2021-12-08: qty 5

## 2021-12-08 MED ORDER — CELECOXIB 200 MG PO CAPS
200.0000 mg | ORAL_CAPSULE | Freq: Two times a day (BID) | ORAL | Status: DC
  Administered 2021-12-08 – 2021-12-09 (×3): 200 mg via ORAL
  Filled 2021-12-08 (×3): qty 1

## 2021-12-08 MED ORDER — DEXAMETHASONE SODIUM PHOSPHATE 10 MG/ML IJ SOLN
INTRAMUSCULAR | Status: DC | PRN
  Administered 2021-12-08: 5 mg via INTRAVENOUS

## 2021-12-08 MED ORDER — ROCURONIUM BROMIDE 10 MG/ML (PF) SYRINGE
PREFILLED_SYRINGE | INTRAVENOUS | Status: AC
  Filled 2021-12-08: qty 10

## 2021-12-08 MED ORDER — MENTHOL 3 MG MT LOZG: 1.0000 | LOZENGE | OROMUCOSAL | Status: DC | PRN

## 2021-12-08 MED ORDER — FENTANYL CITRATE (PF) 250 MCG/5ML IJ SOLN
INTRAMUSCULAR | Status: AC
  Filled 2021-12-08: qty 5

## 2021-12-08 MED ORDER — AMLODIPINE BESYLATE 5 MG PO TABS
2.5000 mg | ORAL_TABLET | Freq: Every day | ORAL | Status: DC
  Administered 2021-12-08: 2.5 mg via ORAL
  Filled 2021-12-08: qty 1

## 2021-12-08 MED ORDER — DEXAMETHASONE SODIUM PHOSPHATE 10 MG/ML IJ SOLN
INTRAMUSCULAR | Status: AC
  Filled 2021-12-08: qty 1

## 2021-12-08 MED ORDER — FENTANYL CITRATE (PF) 100 MCG/2ML IJ SOLN
25.0000 ug | INTRAMUSCULAR | Status: DC | PRN
  Administered 2021-12-08: 50 ug via INTRAVENOUS

## 2021-12-08 MED ORDER — POTASSIUM 99 MG PO TABS: 99.0000 mg | ORAL_TABLET | Freq: Every day | ORAL | Status: DC

## 2021-12-08 MED ORDER — FENTANYL CITRATE (PF) 100 MCG/2ML IJ SOLN
INTRAMUSCULAR | Status: AC
  Filled 2021-12-08: qty 2

## 2021-12-08 MED ORDER — BUPIVACAINE HCL (PF) 0.25 % IJ SOLN
INTRAMUSCULAR | Status: DC | PRN
  Administered 2021-12-08: 6 mL

## 2021-12-08 MED ORDER — SODIUM CHLORIDE 0.9% FLUSH: 3.0000 mL | INTRAVENOUS | Status: DC | PRN

## 2021-12-08 MED ORDER — INSULIN ASPART 100 UNIT/ML IJ SOLN
0.0000 [IU] | Freq: Three times a day (TID) | INTRAMUSCULAR | Status: DC
  Administered 2021-12-08: 8 [IU] via SUBCUTANEOUS
  Administered 2021-12-09: 15 [IU] via SUBCUTANEOUS

## 2021-12-08 MED ORDER — ONDANSETRON HCL 4 MG/2ML IJ SOLN
INTRAMUSCULAR | Status: DC | PRN
  Administered 2021-12-08: 4 mg via INTRAVENOUS

## 2021-12-08 MED ORDER — ACETAMINOPHEN 650 MG RE SUPP: 650.0000 mg | RECTAL | Status: DC | PRN

## 2021-12-08 MED ORDER — HYDROCODONE-ACETAMINOPHEN 5-325 MG PO TABS
1.0000 | ORAL_TABLET | ORAL | Status: DC | PRN
  Administered 2021-12-08 – 2021-12-09 (×4): 1 via ORAL
  Filled 2021-12-08 (×4): qty 1

## 2021-12-08 MED ORDER — CEFAZOLIN SODIUM-DEXTROSE 2-4 GM/100ML-% IV SOLN
2.0000 g | Freq: Three times a day (TID) | INTRAVENOUS | Status: AC
  Administered 2021-12-08 – 2021-12-09 (×2): 2 g via INTRAVENOUS
  Filled 2021-12-08 (×2): qty 100

## 2021-12-08 MED ORDER — ONDANSETRON HCL 4 MG/2ML IJ SOLN
INTRAMUSCULAR | Status: AC
  Filled 2021-12-08: qty 2

## 2021-12-08 MED ORDER — CHLORHEXIDINE GLUCONATE 0.12 % MT SOLN
OROMUCOSAL | Status: AC
  Administered 2021-12-08: 15 mL via OROMUCOSAL
  Filled 2021-12-08: qty 15

## 2021-12-08 MED ORDER — INSULIN GLARGINE-YFGN 100 UNIT/ML ~~LOC~~ SOLN
12.0000 [IU] | Freq: Every day | SUBCUTANEOUS | Status: DC
  Administered 2021-12-08: 12 [IU] via SUBCUTANEOUS
  Filled 2021-12-08 (×2): qty 0.12

## 2021-12-08 MED ORDER — INSULIN ASPART 100 UNIT/ML IJ SOLN
0.0000 [IU] | INTRAMUSCULAR | Status: AC | PRN
  Administered 2021-12-08: 2 [IU] via SUBCUTANEOUS
  Administered 2021-12-08: 4 [IU] via SUBCUTANEOUS

## 2021-12-08 MED ORDER — CHLORHEXIDINE GLUCONATE CLOTH 2 % EX PADS: 6.0000 | MEDICATED_PAD | Freq: Once | CUTANEOUS | Status: DC

## 2021-12-08 MED ORDER — ACETAMINOPHEN 325 MG PO TABS: 650.0000 mg | ORAL_TABLET | ORAL | Status: DC | PRN

## 2021-12-08 MED ORDER — ASPIRIN EC 81 MG PO TBEC
81.0000 mg | DELAYED_RELEASE_TABLET | Freq: Every day | ORAL | Status: DC
  Administered 2021-12-08 – 2021-12-09 (×2): 81 mg via ORAL
  Filled 2021-12-08 (×2): qty 1

## 2021-12-08 MED ORDER — KETAMINE HCL 10 MG/ML IJ SOLN
INTRAMUSCULAR | Status: DC | PRN
  Administered 2021-12-08: 30 mg via INTRAVENOUS
  Administered 2021-12-08 (×2): 10 mg via INTRAVENOUS

## 2021-12-08 MED ORDER — PHENYLEPHRINE HCL-NACL 20-0.9 MG/250ML-% IV SOLN
INTRAVENOUS | Status: DC | PRN
  Administered 2021-12-08: 25 ug/min via INTRAVENOUS

## 2021-12-08 MED ORDER — SODIUM CHLORIDE 0.9% FLUSH: 3.0000 mL | Freq: Two times a day (BID) | INTRAVENOUS | Status: DC

## 2021-12-08 MED ORDER — SUGAMMADEX SODIUM 200 MG/2ML IV SOLN
INTRAVENOUS | Status: DC | PRN
  Administered 2021-12-08: 200 mg via INTRAVENOUS

## 2021-12-08 MED ORDER — METHOCARBAMOL 1000 MG/10ML IJ SOLN
500.0000 mg | Freq: Four times a day (QID) | INTRAVENOUS | Status: DC | PRN
  Filled 2021-12-08: qty 5

## 2021-12-08 MED ORDER — ADULT MULTIVITAMIN W/MINERALS CH
1.0000 | ORAL_TABLET | Freq: Every day | ORAL | Status: DC
  Administered 2021-12-08 – 2021-12-09 (×2): 1 via ORAL
  Filled 2021-12-08 (×2): qty 1

## 2021-12-08 MED ORDER — THROMBIN 20000 UNITS EX SOLR
CUTANEOUS | Status: AC
  Filled 2021-12-08: qty 20000

## 2021-12-08 MED ORDER — MORPHINE SULFATE (PF) 2 MG/ML IV SOLN
2.0000 mg | INTRAVENOUS | Status: DC | PRN
  Administered 2021-12-08: 2 mg via INTRAVENOUS
  Filled 2021-12-08: qty 1

## 2021-12-08 MED ORDER — LIDOCAINE 2% (20 MG/ML) 5 ML SYRINGE
INTRAMUSCULAR | Status: DC | PRN
Start: 2021-12-08 — End: 2021-12-08
  Administered 2021-12-08: 100 mg via INTRAVENOUS

## 2021-12-08 MED ORDER — CELECOXIB 200 MG PO CAPS
200.0000 mg | ORAL_CAPSULE | Freq: Once | ORAL | Status: DC
  Filled 2021-12-08: qty 1

## 2021-12-08 MED ORDER — PHENYLEPHRINE 40 MCG/ML (10ML) SYRINGE FOR IV PUSH (FOR BLOOD PRESSURE SUPPORT)
PREFILLED_SYRINGE | INTRAVENOUS | Status: AC
  Filled 2021-12-08: qty 10

## 2021-12-08 MED ORDER — CEFAZOLIN SODIUM-DEXTROSE 2-4 GM/100ML-% IV SOLN
2.0000 g | INTRAVENOUS | Status: AC
  Administered 2021-12-08: 2 g via INTRAVENOUS
  Filled 2021-12-08: qty 100

## 2021-12-08 MED ORDER — VITAMIN D 25 MCG (1000 UNIT) PO TABS
1000.0000 [IU] | ORAL_TABLET | Freq: Every day | ORAL | Status: DC
  Administered 2021-12-08 – 2021-12-09 (×2): 1000 [IU] via ORAL
  Filled 2021-12-08 (×2): qty 1

## 2021-12-08 MED ORDER — ROCURONIUM BROMIDE 10 MG/ML (PF) SYRINGE
PREFILLED_SYRINGE | INTRAVENOUS | Status: DC | PRN
  Administered 2021-12-08 (×2): 20 mg via INTRAVENOUS
  Administered 2021-12-08: 100 mg via INTRAVENOUS

## 2021-12-08 MED ORDER — FAMCICLOVIR 500 MG PO TABS
500.0000 mg | ORAL_TABLET | Freq: Every day | ORAL | Status: DC
  Administered 2021-12-08 – 2021-12-09 (×2): 500 mg via ORAL
  Filled 2021-12-08 (×2): qty 1

## 2021-12-08 MED ORDER — ACETAMINOPHEN 10 MG/ML IV SOLN
INTRAVENOUS | Status: AC
  Filled 2021-12-08: qty 100

## 2021-12-08 MED ORDER — MIDAZOLAM HCL 2 MG/2ML IJ SOLN
INTRAMUSCULAR | Status: DC | PRN
  Administered 2021-12-08: 2 mg via INTRAVENOUS

## 2021-12-08 MED ORDER — BUPIVACAINE HCL (PF) 0.25 % IJ SOLN
INTRAMUSCULAR | Status: AC
  Filled 2021-12-08: qty 30

## 2021-12-08 MED ORDER — ONDANSETRON HCL 4 MG PO TABS: 4.0000 mg | ORAL_TABLET | Freq: Four times a day (QID) | ORAL | Status: DC | PRN

## 2021-12-08 MED ORDER — KETAMINE HCL 50 MG/5ML IJ SOSY
PREFILLED_SYRINGE | INTRAMUSCULAR | Status: AC
  Filled 2021-12-08: qty 5

## 2021-12-08 MED ORDER — THROMBIN 5000 UNITS EX SOLR
CUTANEOUS | Status: AC
  Filled 2021-12-08: qty 5000

## 2021-12-08 MED ORDER — GABAPENTIN 300 MG PO CAPS
300.0000 mg | ORAL_CAPSULE | ORAL | Status: DC
  Filled 2021-12-08: qty 1

## 2021-12-08 MED ORDER — ACETAMINOPHEN 10 MG/ML IV SOLN
INTRAVENOUS | Status: DC | PRN
  Administered 2021-12-08: 1000 mg via INTRAVENOUS

## 2021-12-08 MED ORDER — POTASSIUM CHLORIDE IN NACL 20-0.9 MEQ/L-% IV SOLN: INTRAVENOUS | Status: DC

## 2021-12-08 MED ORDER — PROPOFOL 10 MG/ML IV BOLUS
INTRAVENOUS | Status: DC | PRN
Start: 2021-12-08 — End: 2021-12-08
  Administered 2021-12-08: 170 mg via INTRAVENOUS

## 2021-12-08 MED ORDER — SODIUM CHLORIDE 0.9 % IV SOLN: 250.0000 mL | INTRAVENOUS | Status: DC

## 2021-12-08 MED ORDER — THROMBIN 20000 UNITS EX SOLR: CUTANEOUS | Status: DC | PRN

## 2021-12-08 SURGICAL SUPPLY — 62 items
ADH SKN CLS APL DERMABOND .7 (GAUZE/BANDAGES/DRESSINGS) ×1
ADH SKN CLS LQ APL DERMABOND (GAUZE/BANDAGES/DRESSINGS) ×1
APL SKNCLS STERI-STRIP NONHPOA (GAUZE/BANDAGES/DRESSINGS) ×1
BAG COUNTER SPONGE SURGICOUNT (BAG) ×3 IMPLANT
BAG SPNG CNTER NS LX DISP (BAG) ×2
BASKET BONE COLLECTION (BASKET) ×2 IMPLANT
BENZOIN TINCTURE PRP APPL 2/3 (GAUZE/BANDAGES/DRESSINGS) ×2 IMPLANT
BUR CARBIDE MATCH 3.0 (BURR) ×2 IMPLANT
CANISTER SUCT 3000ML PPV (MISCELLANEOUS) ×2 IMPLANT
CLIP GRAFTMAG DISP 742X3.25 (NEUROSURGERY SUPPLIES) ×1 IMPLANT
CNTNR URN SCR LID CUP LEK RST (MISCELLANEOUS) ×1 IMPLANT
CONT SPEC 4OZ STRL OR WHT (MISCELLANEOUS) ×2
COVER BACK TABLE 60X90IN (DRAPES) ×2 IMPLANT
DERMABOND ADHESIVE PROPEN (GAUZE/BANDAGES/DRESSINGS) ×1
DERMABOND ADVANCED (GAUZE/BANDAGES/DRESSINGS) ×1
DERMABOND ADVANCED .7 DNX12 (GAUZE/BANDAGES/DRESSINGS) ×1 IMPLANT
DERMABOND ADVANCED .7 DNX6 (GAUZE/BANDAGES/DRESSINGS) IMPLANT
DRAPE C-ARM 42X72 X-RAY (DRAPES) ×4 IMPLANT
DRAPE LAPAROTOMY 100X72X124 (DRAPES) ×2 IMPLANT
DRSG OPSITE POSTOP 4X6 (GAUZE/BANDAGES/DRESSINGS) ×1 IMPLANT
DURAPREP 26ML APPLICATOR (WOUND CARE) ×2 IMPLANT
ELECT REM PT RETURN 9FT ADLT (ELECTROSURGICAL) ×2
ELECTRODE REM PT RTRN 9FT ADLT (ELECTROSURGICAL) ×1 IMPLANT
EVACUATOR 1/8 PVC DRAIN (DRAIN) ×2 IMPLANT
GAUZE 4X4 16PLY ~~LOC~~+RFID DBL (SPONGE) ×1 IMPLANT
GLOVE SURG ENC MOIS LTX SZ8 (GLOVE) ×4 IMPLANT
GOWN STRL REUS W/ TWL LRG LVL3 (GOWN DISPOSABLE) IMPLANT
GOWN STRL REUS W/ TWL XL LVL3 (GOWN DISPOSABLE) ×2 IMPLANT
GOWN STRL REUS W/TWL 2XL LVL3 (GOWN DISPOSABLE) IMPLANT
GOWN STRL REUS W/TWL LRG LVL3 (GOWN DISPOSABLE) ×4
GOWN STRL REUS W/TWL XL LVL3 (GOWN DISPOSABLE) ×4
GRAFT BONE PROTEIOS XL 10CC (Orthopedic Implant) ×1 IMPLANT
HEMOSTAT POWDER KIT SURGIFOAM (HEMOSTASIS) ×1 IMPLANT
KIT BASIN OR (CUSTOM PROCEDURE TRAY) ×2 IMPLANT
KIT TURNOVER KIT B (KITS) ×2 IMPLANT
MATRIX SPINE STRIP NEOCORE 5CC (Putty) IMPLANT
MILL MEDIUM DISP (BLADE) ×1 IMPLANT
NDL HYPO 25X1 1.5 SAFETY (NEEDLE) ×1 IMPLANT
NEEDLE HYPO 25X1 1.5 SAFETY (NEEDLE) ×2 IMPLANT
NS IRRIG 1000ML POUR BTL (IV SOLUTION) ×2 IMPLANT
PACK LAMINECTOMY NEURO (CUSTOM PROCEDURE TRAY) ×2 IMPLANT
ROD LORD LIPPED TI 5.5X80 (Rod) ×2 IMPLANT
SCREW ILIAC PA 5.5X40 (Screw) ×4 IMPLANT
SCREW POLYAXIAL TULIP (Screw) ×2 IMPLANT
SCREW SHANK MOD 5.5X40 (Screw) ×2 IMPLANT
SET SCREW (Screw) ×12 IMPLANT
SET SCREW SPNE (Screw) IMPLANT
SPACER PEEK NANOTEC 9X9X25 5D (Spacer) ×2 IMPLANT
SPACER TRANSCEND 9X9X25 10D (Spacer) ×2 IMPLANT
SPONGE SURGIFOAM ABS GEL 100 (HEMOSTASIS) ×3 IMPLANT
SPONGE T-LAP 4X18 ~~LOC~~+RFID (SPONGE) ×1 IMPLANT
STRIP CLOSURE SKIN 1/2X4 (GAUZE/BANDAGES/DRESSINGS) ×3 IMPLANT
STRIP MATRIX NEOCORE 5CC (Putty) ×2 IMPLANT
SUT VIC AB 0 CT1 18XCR BRD8 (SUTURE) ×1 IMPLANT
SUT VIC AB 0 CT1 8-18 (SUTURE) ×4
SUT VIC AB 2-0 CP2 18 (SUTURE) ×3 IMPLANT
SUT VIC AB 3-0 SH 8-18 (SUTURE) ×4 IMPLANT
SYR CONTROL 10ML LL (SYRINGE) ×2 IMPLANT
TOWEL GREEN STERILE (TOWEL DISPOSABLE) ×2 IMPLANT
TOWEL GREEN STERILE FF (TOWEL DISPOSABLE) ×2 IMPLANT
TRAY FOLEY MTR SLVR 16FR STAT (SET/KITS/TRAYS/PACK) ×2 IMPLANT
WATER STERILE IRR 1000ML POUR (IV SOLUTION) ×2 IMPLANT

## 2021-12-08 NOTE — Anesthesia Procedure Notes (Signed)
Procedure Name: Intubation Date/Time: 12/08/2021 8:35 AM Performed by: Dorann Lodge, CRNA Pre-anesthesia Checklist: Patient identified, Emergency Drugs available, Suction available and Patient being monitored Patient Re-evaluated:Patient Re-evaluated prior to induction Oxygen Delivery Method: Circle System Utilized Preoxygenation: Pre-oxygenation with 100% oxygen Induction Type: IV induction Ventilation: Mask ventilation without difficulty Laryngoscope Size: Mac and 4 Grade View: Grade I Tube type: Oral Tube size: 7.0 mm Number of attempts: 1 Airway Equipment and Method: Stylet Placement Confirmation: ETT inserted through vocal cords under direct vision, positive ETCO2 and breath sounds checked- equal and bilateral Secured at: 22 cm Tube secured with: Tape Dental Injury: Teeth and Oropharynx as per pre-operative assessment

## 2021-12-08 NOTE — H&P (Signed)
Subjective: Patient is a 66 y.o. female admitted for PLIF. Onset of symptoms was several months ago, gradually worsening since that time.  The pain is rated severe, and is located at the across the lower back and radiates to legs. The legs feel weak. The pain is described as aching and occurs all day. The symptoms have been progressive. Symptoms are exacerbated by exercise, standing, and walking for more than a few minutes. MRI or CT showed severe stenosis.   Past Medical History:  Diagnosis Date   Allergy    Atherosclerotic heart disease of native coronary artery without angina pectoris 01/13/2020   Blood in stool    Carotid artery occlusion 08/13/2009   Carotid stenosis    Cervical lymphadenopathy 10/31/2019   CLL (chronic lymphocytic leukemia) ([place]) 90/30/0923   Complication of anesthesia    Coronary artery calcification seen on CT scan 06/01/2020   Diabetes mellitus without complication ([place])    Diabetic peripheral neuropathy associated with type 2 diabetes mellitus ([place]) 06/08/2009   Elevated liver function tests    Essential hypertension 11/08/2019   Fatty liver 09/06/2012   Goals of care, counseling/discussion 10/28/2019   Hardening of the aorta (main artery of the heart) ([place]) 01/13/2020   History of adenomatous polyp of colon 03/16/2021   Hyperglycemia due to type 2 diabetes mellitus ([place]) 03/09/2021   Hyperlipidemia    Hypertension    Hypertriglyceridemia 03/16/2021   Long term (current) use of insulin ([place]) 03/09/2021   Lumbar radiculopathy 11/30/2021   Lymphadenopathy, abdominal 10/28/2019   Malignant lymphoma, small lymphocytic ([place]) 11/13/2019   Mixed dyslipidemia 06/01/2020   Obesity 04/19/2012   Personal history of COVID-19 11/13/2019   Pneumonia due to COVID-19 virus 11/08/2019   PONV (postoperative nausea and vomiting)    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 Authorizing Provider  amLODipine (NORVASC) 2.5 MG tablet TAKE 1 TABLET BY MOUTH EVERY DAY 09/22/21  Yes Volanda Napoleon, MD  aspirin EC 81 MG tablet Take 81 mg by mouth daily. Swallow whole.   Yes [provider]  benazepril (LOTENSIN) 20 MG tablet Take 20 mg by mouth at bedtime.   Yes [provider]  Dulaglutide 3 MG/0.5ML SOPN Inject 3 mg into the skin once a week.   Yes [provider]  famciclovir (FAMVIR) 500 MG tablet Take 1 tablet (500 mg total) by mouth daily. 11/24/20  Yes Volanda Napoleon, MD  hydrochlorothiazide (HYDRODIURIL) 25 MG tablet Take 25 mg by mouth at bedtime. 11/11/21  Yes [provider]  insulin regular human CONCENTRATED (HUMULIN R) 500 UNIT/ML injection Inject 50-90 Units into the skin See admin instructions. Inject 80 units subcutaneously before breakfast, and 50 units before lunch and 90 for supper   Yes [provider]  Multiple Vitamins-Minerals (MULTIVITAMIN WITH MINERALS) tablet Take 1 tablet by mouth daily.   Yes [provider]  Potassium 99 MG TABS Take 99 mg by mouth daily.   Yes [provider]  rosuvastatin (CRESTOR) 10 MG tablet Take 10 mg  by mouth at bedtime.   Yes [provider]  Vitamin D3 (VITAMIN D) 25 MCG tablet Take 1,000 Units by mouth daily.   Yes [provider]  prochlorperazine (COMPAZINE) 10 MG tablet Take 1 tablet (10 mg total) by mouth every 6 (six) hours as needed (Nausea or vomiting). 02/11/20 08/27/20  Volanda Napoleon, MD   Allergies  Allergen Reactions   Canagliflozin Other (See Comments)    recurrent vaginitis   Glucophage [Metformin] Other (See  Comments)    GI intolerance   Niaspan [Niacin] Other (See Comments)    flushing    Social History   Tobacco Use   Smoking status: Never   Smokeless tobacco: Never  Substance Use Topics   Alcohol use: No    Family History  Problem Relation Age of Onset   Colon cancer Neg Hx    Esophageal cancer Neg Hx    Rectal cancer Neg Hx    Stomach cancer Neg Hx    Pancreatic cancer Neg Hx      Review of Systems  Positive ROS: neg  All other systems have been reviewed and were otherwise negative with the exception of those mentioned in the HPI and as above.  Objective: Vital signs in last 24 hours: Temp:  [98.5 F (36.9 C)] 98.5 F (36.9 C) (02/08 0701) Pulse Rate:  [96] 96 (02/08 0701) Resp:  [18] 18 (02/08 0701) BP: (168)/(61) 168/61 (02/08 0701) SpO2:  [96 %] 96 % (02/08 0701) Weight:  [81.6 kg] 81.6 kg (02/08 0701)  General Appearance: Alert, cooperative, no distress, appears stated age Head: Normocephalic, without obvious abnormality, atraumatic Eyes: PERRL, conjunctiva/corneas clear, EOM's intact    Neck: Supple, symmetrical, trachea midline Back: Symmetric, no curvature, ROM normal, no CVA tenderness Lungs:  respirations unlabored Heart: Regular rate and rhythm Abdomen: Soft, non-tender Extremities: Extremities normal, atraumatic, no cyanosis or edema Pulses: 2+ and symmetric all extremities Skin: Skin color, texture, turgor normal, no rashes or lesions  NEUROLOGIC:   Mental status: Alert and oriented x4,  no aphasia, good attention span, fund of knowledge, and memory Motor Exam - grossly normal Sensory Exam - grossly normal Reflexes: 1= Coordination - grossly normal Gait - grossly normal Balance - grossly normal Cranial Nerves: I: smell Not tested  II: visual acuity  OS: nl    OD: nl  II: visual fields Full to confrontation  II: pupils Equal, round, reactive to light  III,VII: ptosis None  III,IV,VI: extraocular muscles  Full ROM  V: mastication Normal   V: facial light touch sensation  Normal  V,VII: corneal reflex  Present  VII: facial muscle function - upper  Normal  VII: facial muscle function - lower Normal  VIII: hearing Not tested  IX: soft palate elevation  Normal  IX,X: gag reflex Present  XI: trapezius strength  5/5  XI: sternocleidomastoid strength 5/5  XI: neck flexion strength  5/5  XII: tongue strength  Normal    Data Review Lab Results  Component Value Date   WBC 8.6 11/16/2021   HGB 14.0 11/16/2021   HCT 43.7 11/16/2021   MCV 78.9 (L) 11/16/2021   PLT 233 11/16/2021   Lab Results  Component Value Date   NA 141 11/30/2021   K 3.7 11/30/2021   CL 96 11/30/2021   CO2 27 11/30/2021   BUN 15 11/30/2021   CREATININE 0.54 (L) 11/30/2021   GLUCOSE 384 (H) 11/30/2021   Lab Results  Component Value Date   INR 1.0 12/08/2021  Assessment/Plan:  Estimated body mass index is 29.05 kg/m as calculated from the following:   Height as of this encounter: 5\' 6"  (1.676 m).   Weight as of this encounter: 81.6 kg. Patient admitted for PLIF L1-2 L2-3. Patient has failed a reasonable attempt at conservative therapy.  I explained the condition and procedure to the patient and answered any questions.  Patient wishes to proceed with procedure as planned. Understands risks/ benefits and typical outcomes of procedure.   Eustace Moore 12/08/2021 7:59 AM

## 2021-12-08 NOTE — Transfer of Care (Signed)
Immediate Anesthesia Transfer of Care Note  Patient: Valerie Cline  Procedure(s) Performed: LUMBAR ONE-TWO, LUMBAR TWO-THREE POSTERIOR LUMBAR INTERBODY FUSION (Spine Lumbar)  Patient Location: PACU  Anesthesia Type:General  Level of Consciousness: awake and drowsy  Airway & Oxygen Therapy: Patient Spontanous Breathing and Patient connected to face mask oxygen  Post-op Assessment: Report given to RN and Post -op Vital signs reviewed and stable  Post vital signs: Reviewed and stable  Last Vitals:  Vitals Value Taken Time  BP 136/60 12/08/21 1242  Temp    Pulse 99 12/08/21 1244  Resp 15 12/08/21 1244  SpO2 97 % 12/08/21 1244  Vitals shown include unvalidated device data.  Last Pain:  Vitals:   12/08/21 0721  TempSrc:   PainSc: 0-No pain      Patients Stated Pain Goal: 1 (82/95/62 1308)  Complications: No notable events documented.

## 2021-12-08 NOTE — Anesthesia Postprocedure Evaluation (Signed)
Anesthesia Post Note  Patient: Valerie Cline  Procedure(s) Performed: LUMBAR ONE-TWO, LUMBAR TWO-THREE POSTERIOR LUMBAR INTERBODY FUSION (Spine Lumbar)     Patient location during evaluation: PACU Anesthesia Type: General Level of consciousness: sedated Pain management: pain level controlled Vital Signs Assessment: post-procedure vital signs reviewed and stable Respiratory status: spontaneous breathing and respiratory function stable Cardiovascular status: stable Postop Assessment: no apparent nausea or vomiting Anesthetic complications: no   No notable events documented.  Last Vitals:  Vitals:   12/08/21 1400 12/08/21 1415  BP: 101/87 (!) 106/48  Pulse: 84 83  Resp: 12 11  Temp:    SpO2: 96% 97%    Last Pain:  Vitals:   12/08/21 1400  TempSrc:   PainSc: Asleep                 Cheney Ewart DANIEL

## 2021-12-08 NOTE — Op Note (Signed)
12/08/2021  12:37 PM  PATIENT:  Valerie Cline  66 y.o. female  PRE-OPERATIVE DIAGNOSIS: 1.  Severe spinal stenosis at L1-2 and L2-3, 2.  Retrolisthesis L1-2, 3.  Rotary  16 degree scoliosis L1-L3 with focal kyphosis at L1-2 and loss of lordosis with PI-LL mismatch of 7, 4.  Back pain with leg pain with leg weakness  POST-OPERATIVE DIAGNOSIS:  same  PROCEDURE:   1. Decompressive lumbar laminectomy, hemi facetectomy and foraminotomies L1-2 and L2-3 requiring more work than would be required for a simple exposure of the disk for PLIF in order to adequately decompress the neural elements and address the spinal stenosis, with osteotomies performed at both levels to address loss of lordosis and spinal deformity 2. Posterior lumbar interbody fusion L1-2 and L2-3 using peek interbody cages packed with morcellized allograft and autograft  3. Posterior fixation L1-L3 inclusive using Alphatec cortical pedicle screws.  4. Intertransverse arthrodesis L1-L3 using morcellized autograft and allograft.  SURGEON:  Sherley Bounds, MD  ASSISTANTS: Dr. Ashok Pall  ANESTHESIA:  General  EBL: 250 ml  Total I/O In: 1200 [I.V.:1000; IV Piggyback:200] Out: 650 [Urine:400; Blood:250]  BLOOD ADMINISTERED:none  DRAINS: none   INDICATION FOR PROCEDURE: This patient presented with back pain with leg pain with progressive leg weakness. Imaging revealed retrolisthesis L1-2 with severe spinal stenosis L1-2 and L2-3 with rotary scoliosis with loss of lordosis with PI-LL mismatch. The patient tried a reasonable attempt at conservative medical measures without relief. I recommended decompression and instrumented fusion to address the stenosis as well as the segmental  instability.  Patient understood the risks, benefits, and alternatives and potential outcomes and wished to proceed.  PROCEDURE DETAILS:  The patient was brought to the operating room. After induction of generalized endotracheal anesthesia the patient was  rolled into the prone position on chest rolls and all pressure points were padded. The patient's lumbar region was cleaned and then prepped with DuraPrep and draped in the usual sterile fashion. Anesthesia was injected and then a dorsal midline incision was made and carried down to the lumbosacral fascia. The fascia was opened and the paraspinous musculature was taken down in a subperiosteal fashion to expose L1-L3. A self-retaining retractor was placed. Intraoperative fluoroscopy confirmed my level, and I started with placement of the L1 cortical pedicle screws.  The L1 pedicles measured 4 mm preoperatively.  The pedicle screw entry zones were identified utilizing surface landmarks and  AP and lateral fluoroscopy. I scored the cortex with the high-speed drill and then used the hand drill to drill an upward and outward direction into the pedicle. I then tapped line to line. I palpated with a ball probe to assure no break in the cortex.  I then placed a 5.5 x 40 mm cortical pedicle screw into the pedicles of L1 bilaterally.    I then turned my attention to the decompression and complete lumbar laminectomies, hemi- facetectomies, and foraminotomies were performed at L1-2 and L2-3.  Osteotomies were performed at both levels by removing the posterior elements to help achieve better lordosis.   the patient had significant spinal stenosis and this required more work than would be required for a simple exposure of the disc for posterior lumbar interbody fusion which would only require a limited laminotomy. Much more generous decompression and generous foraminotomy was undertaken in order to adequately decompress the neural elements and address the patient's leg pain. The yellow ligament was removed to expose the underlying dura and nerve roots, and generous foraminotomies were performed  to adequately decompress the neural elements. Both the exiting and traversing nerve roots were decompressed on both sides until a  coronary dilator passed easily along the nerve roots. Once the decompression was complete, I turned my attention to the posterior lumbar interbody fusion.  Dr. Christella Noa assisted with the interbody fusion.  The epidural venous vasculature was coagulated and cut sharply. Disc space was incised and the initial discectomy was performed with pituitary rongeurs. The disc space was distracted with sequential distractors to a height of 9 mm at both levels. We then used a series of scrapers and shavers to prepare the endplates for fusion. The midline was prepared with Epstein curettes. Once the complete discectomy was finished, we packed an appropriate sized interbody cage with local autograft and morcellized allograft, gently retracted the nerve root, and tapped the cage into position at L1-2 and L2-3.  The midline between the cages was packed with morselized autograft and allograft.   We then turned our attention to the placement of the lower pedicle screws. The pedicle screw entry zones were identified utilizing surface landmarks and fluoroscopy. I drilled into each pedicle utilizing the hand drill, and tapped each pedicle with the appropriate tap. We palpated with a ball probe to assure no break in the cortex. We then placed 5.5 x 40 mm pedicle screws into the pedicles bilaterally at L2 and L3.   We then decorticated the transverse processes of L1-L2 and L3 and laid a mixture of morcellized autograft and allograft out over these to perform intertransverse arthrodesis at L1-L3. We then placed lordotic rods into the multiaxial screw heads of the pedicle screws and locked these in position with the locking caps and anti-torque device while achieving compression of her grafts. We then checked our construct with AP and lateral fluoroscopy. Irrigated with copious amounts of bacitracin-containing saline solution. Inspected the nerve roots once again to assure adequate decompression, lined to the dura with Gelfoam,  and then we  closed the muscle and the fascia with 0 Vicryl. Closed the subcutaneous tissues with 2-0 Vicryl and subcuticular tissues with 3-0 Vicryl. The skin was closed with benzoin and Steri-Strips. Dressing was then applied, the patient was awakened from general anesthesia and transported to the recovery room in stable condition. At the end of the procedure all sponge, needle and instrument counts were correct.   PLAN OF CARE: admit to inpatient  PATIENT DISPOSITION:  PACU - hemodynamically stable.   Delay start of Pharmacological VTE agent (>24hrs) due to surgical blood loss or risk of bleeding:  yes

## 2021-12-08 NOTE — Progress Notes (Signed)
Inpatient Diabetes Program Recommendations  AACE/ADA: New Consensus Statement on Inpatient Glycemic Control (2015)  Target Ranges:  Prepandial:   less than 140 mg/dL      Peak postprandial:   less than 180 mg/dL (1-2 hours)      Critically ill patients:  140 - 180 mg/dL   Lab Results  Component Value Date   GLUCAP 304 (H) 12/08/2021   HGBA1C 7.3 (H) 11/16/2021    Review of Glycemic Control  Latest Reference Range & Units 12/08/21 12:11 12/08/21 12:44 12/08/21 13:53  Glucose-Capillary 70 - 99 mg/dL 216 (H) 218 (H) 304 (H)  (H): Data is abnormally high Diabetes history: Type 2 DM Outpatient Diabetes medications: U-500 80/50/90, Dulaglutide 3 mg qwk Current orders for Inpatient glycemic control: Novolog 0-15 units TID Decadron 4 mg x 1 Inpatient Diabetes Program Recommendations:    Consider adding Semglee 12 units Q24H.   Thanks, Bronson Curb, MSN, RNC-OB Diabetes Coordinator [phone] (8a-5p)

## 2021-12-09 LAB — GLUCOSE, CAPILLARY: Glucose-Capillary: 357 mg/dL — ABNORMAL HIGH (ref 70–99)

## 2021-12-09 MED ORDER — CELECOXIB 200 MG PO CAPS: 200.0000 mg | ORAL_CAPSULE | Freq: Two times a day (BID) | ORAL | 0 refills | Status: DC

## 2021-12-09 MED ORDER — METHOCARBAMOL 500 MG PO TABS: 500.0000 mg | ORAL_TABLET | Freq: Four times a day (QID) | ORAL | 1 refills | Status: DC | PRN

## 2021-12-09 MED ORDER — HYDROCODONE-ACETAMINOPHEN 5-325 MG PO TABS: 1.0000 | ORAL_TABLET | ORAL | 0 refills | Status: DC | PRN

## 2021-12-09 NOTE — Progress Notes (Signed)
Patient awaiting transport via wheelchair by volunteer for discharge home; in no acute distress nor complaints of pain nor discomfort; incision on her back with honeycomb dressing and is clean, dry and intact; room was checked and accounted for all her belongings; discharge instructions concerning her medications, incision care, follow up appointment and when to call the doctor as needed were all discussed with patient and husband and both expressed understanding on the instructions given.

## 2021-12-09 NOTE — Plan of Care (Signed)

## 2021-12-09 NOTE — Evaluation (Signed)
Physical Therapy Evaluation Patient Details Name: Valerie Cline MRN: [id number] DOB: 1955-12-02 Today's Date: 12/09/2021  History of Present Illness  Pt is a 66 y.o. F admitted on 12/08/21 s/p PLIF. PMH significant for DM2, HTN, Lymphoma, Atherosclerotic heart disease.   Clinical Impression  Patient evaluated by Physical Therapy with no further acute PT needs identified. All education has been completed and the patient has no further questions. Pt was able to demonstrate transfers and ambulation with gross modified independence and RW for support. Pt was educated on precautions, brace application/wearing schedule, appropriate activity progression, and car transfer. See below for any follow-up Physical Therapy or equipment needs. PT is signing off. Thank you for this referral.        Recommendations for follow up therapy are one component of a multi-disciplinary discharge planning process, led by the attending physician.  Recommendations may be updated based on patient status, additional functional criteria and insurance authorization.  Follow Up Recommendations No PT follow up    Assistance Recommended at Discharge PRN  Patient can return home with the following  Help with stairs or ramp for entrance;Assist for transportation    Equipment Recommendations None recommended by PT  Recommendations for Other Services       Functional Status Assessment Patient has had a recent decline in their functional status and demonstrates the ability to make significant improvements in function in a reasonable and predictable amount of time.     Precautions / Restrictions Precautions Precautions: Back Precaution Booklet Issued: Yes (comment) Precaution Comments: Reviewed precautions and educated on compensatory strategies. Required Braces or Orthoses: Spinal Brace Spinal Brace: Lumbar corset Restrictions Weight Bearing Restrictions: No      Mobility  Bed Mobility Overal bed mobility: Modified  Independent             General bed mobility comments: HOB flat and rails lowered to simulate home environment.    Transfers Overall transfer level: Modified independent Equipment used: Rolling walker (2 wheels)               General transfer comment: VC's for improved posture and hand placement on seated surface for safety. No assist required.    Ambulation/Gait Ambulation/Gait assistance: Modified independent (Device/Increase time) Gait Distance (Feet): 400 Feet Assistive device: Rolling walker (2 wheels) Gait Pattern/deviations: Step-through pattern, Decreased stride length, Trunk flexed Gait velocity: Decreased Gait velocity interpretation: 1.31 - 2.62 ft/sec, indicative of limited community ambulator   General Gait Details: VC's for improved posture, closer walker proximiy, and forward gaze. No unsteadiness noted with RW, however without, pt antalgic and with poor spinal alignment (lateral shift).  Stairs Stairs: Yes Stairs assistance: Min guard Stair Management: One rail Left, Step to pattern, Sideways Number of Stairs: 6 General stair comments: VC's for sequencing and general safety with sideways negotiation.  Wheelchair Mobility    Modified Rankin (Stroke Patients Only)       Balance Overall balance assessment: Mild deficits observed, not formally tested                                           Pertinent Vitals/Pain Pain Assessment Pain Assessment: 0-10 Pain Score: 2  Pain Location: Low back/incision site Pain Descriptors / Indicators: Aching, Discomfort, Grimacing Pain Intervention(s): Limited activity within patient's tolerance, Monitored during session, Repositioned    Home Living Family/patient expects to be discharged to:: Private residence Living Arrangements: Spouse/significant  other Available Help at Discharge: Family;Available 24 hours/day Type of Home: House Home Access: Stairs to enter Entrance Stairs-Rails:  Left Entrance Stairs-Number of Steps: 2 steps Alternate Level Stairs-Number of Steps: Split Level - 6 steps to main floor Home Layout: Two level Home Equipment: Conservation officer, nature (2 wheels)      Prior Function Prior Level of Function : Independent/Modified Independent             Mobility Comments: Pt reports intermittent use of RW, mainly due to pain ADLs Comments: Indep.     Hand Dominance   Dominant Hand: Right    Extremity/Trunk Assessment   Upper Extremity Assessment Upper Extremity Assessment: Overall WFL for tasks assessed    Lower Extremity Assessment Lower Extremity Assessment: Generalized weakness (Mild; consistent with pre-op diagnosis)    Cervical / Trunk Assessment Cervical / Trunk Assessment: Back Surgery  Communication   Communication: No difficulties  Cognition Arousal/Alertness: Awake/alert Behavior During Therapy: WFL for tasks assessed/performed Overall Cognitive Status: Within Functional Limits for tasks assessed                                          General Comments General comments (skin integrity, edema, etc.): VSS on RA, dressing clean and intact    Exercises     Assessment/Plan    PT Assessment Patient does not need any further PT services  PT Problem List         PT Treatment Interventions      PT Goals (Current goals can be found in the Care Plan section)  Acute Rehab PT Goals Patient Stated Goal: Home today PT Goal Formulation: With patient/family Time For Goal Achievement: 12/16/21 Potential to Achieve Goals: Good    Frequency       Co-evaluation               AM-PAC PT "6 Clicks" Mobility  Outcome Measure Help needed turning from your back to your side while in a flat bed without using bedrails?: None Help needed moving from lying on your back to sitting on the side of a flat bed without using bedrails?: None Help needed moving to and from a bed to a chair (including a wheelchair)?:  None Help needed standing up from a chair using your arms (e.g., wheelchair or bedside chair)?: None Help needed to walk in hospital room?: None Help needed climbing 3-5 steps with a railing? : A Little 6 Click Score: 23    End of Session Equipment Utilized During Treatment: Gait belt Activity Tolerance: Patient tolerated treatment well Patient left: in bed;with call bell/phone within reach;with family/visitor present Nurse Communication: Mobility status PT Visit Diagnosis: Unsteadiness on feet (R26.81);Pain Pain - part of body:  (back)    Time: 1224-8250 PT Time Calculation (min) (ACUTE ONLY): 16 min   Charges:   PT Evaluation $PT Eval Low Complexity: 1 Low          Rolinda Roan, PT, DPT Acute Rehabilitation Services Pager: [phone] Office: [phone]   Thelma Comp 12/09/2021, 11:28 AM

## 2021-12-09 NOTE — Discharge Summary (Signed)
Physician Discharge Summary  Patient ID: Valerie Cline MRN: [id number] DOB/AGE: Jan 18, 1956 66 y.o.  Admit date: 12/08/2021 Discharge date: 12/09/2021  Admission Diagnoses: Scoliosis with lumbar spinal stenosis with back pain and leg pain   Discharge Diagnoses: Same   Discharged Condition: good  Hospital Course: The patient was admitted on 12/08/2021 and taken to the operating room where the patient underwent posterior lumbar interbody fusion L1-2 L2 3. The patient tolerated the procedure well and was taken to the recovery room and then to the floor in stable condition. The hospital course was routine. There were no complications. The wound remained clean dry and intact. Pt had appropriate back soreness. No complaints of leg pain or new N/T/W. The patient remained afebrile with stable vital signs, and tolerated a regular diet. The patient continued to increase activities, and pain was well controlled with oral pain medications.   Consults: None  Significant Diagnostic Studies:  Results for orders placed or performed during the hospital encounter of 12/08/21  Protime - INR  Result Value Ref Range   Prothrombin Time 13.3 11.4 - 15.2 seconds   INR 1.0 0.8 - 1.2  Glucose, capillary  Result Value Ref Range   Glucose-Capillary 129 (H) 70 - 99 mg/dL  Glucose, capillary  Result Value Ref Range   Glucose-Capillary 136 (H) 70 - 99 mg/dL  Glucose, capillary  Result Value Ref Range   Glucose-Capillary 177 (H) 70 - 99 mg/dL  Glucose, capillary  Result Value Ref Range   Glucose-Capillary 216 (H) 70 - 99 mg/dL  Glucose, capillary  Result Value Ref Range   Glucose-Capillary 218 (H) 70 - 99 mg/dL  Glucose, capillary  Result Value Ref Range   Glucose-Capillary 304 (H) 70 - 99 mg/dL  Glucose, capillary  Result Value Ref Range   Glucose-Capillary 291 (H) 70 - 99 mg/dL  Glucose, capillary  Result Value Ref Range   Glucose-Capillary 330 (H) 70 - 99 mg/dL  Glucose, capillary  Result Value Ref  Range   Glucose-Capillary 357 (H) 70 - 99 mg/dL   Comment 1 Notify RN    Comment 2 Document in Chart   Type and screen [place]  Result Value Ref Range   ABO/RH(D) A POS    Antibody Screen NEG    Sample Expiration      12/11/2021,2359 Performed at [facility], [address], [place], [place] [address]     DG Lumbar Spine 2-3 Views  Result Date: 12/08/2021 CLINICAL DATA:  Posterior lumbar interbody fusion fluoroscopy. EXAM: LUMBAR SPINE - 2-3 VIEW COMPARISON:  Lumbar spine radiographs 08/17/2021 FINDINGS: Five total images. 1 minute 54 seconds fluoroscopy time. Dose: 90.8 mGy. Images were performed intraoperatively without the presence of a radiologist. Redemonstration of grade 3 anterolisthesis of L5 on S1. There is L1 through L3 bilateral transpedicular rod and screw fusion hardware with associated intervertebral disc spacers. Please see intraoperative findings for further detail. IMPRESSION: L1 through L3 posterior fusion. Electronically Signed   By: Yvonne Kendall M.D.   On: 12/08/2021 13:00   DG C-Arm 1-60 Min-No Report  Result Date: 12/08/2021 Fluoroscopy was utilized by the requesting physician.  No radiographic interpretation.   DG C-Arm 1-60 Min-No Report  Result Date: 12/08/2021 Fluoroscopy was utilized by the requesting physician.  No radiographic interpretation.   DG C-Arm 1-60 Min-No Report  Result Date: 12/08/2021 Fluoroscopy was utilized by the requesting physician.  No radiographic interpretation.   DG C-Arm 1-60 Min-No Report  Result Date: 12/08/2021 Fluoroscopy  was utilized by the requesting physician.  No radiographic interpretation.    Antibiotics:  Anti-infectives (From admission, onward)    Start     Dose/Rate Route Frequency Ordered Stop   12/08/21 1530  famciclovir [facility]) tablet 500 mg        500 mg Oral Daily 12/08/21 1443     12/08/21 1530  ceFAZolin (ANCEF) IVPB 2g/100 mL premix        2 g 200 mL/hr over 30 Minutes  Intravenous Every 8 hours 12/08/21 1443 12/09/21 0200   12/08/21 0800  ceFAZolin (ANCEF) IVPB 2g/100 mL premix        2 g 200 mL/hr over 30 Minutes Intravenous On call to O.R. 12/08/21 2951 12/08/21 0837       Discharge Exam: Blood pressure (!) 101/53, pulse 84, temperature 98.6 F (37 C), temperature source Oral, resp. rate 16, height 5\' 6"  (1.676 m), weight 81.6 kg, SpO2 99 %. Neurologic: Grossly normal Dressing dry  Discharge Medications:   Allergies as of 12/09/2021       Reactions   Canagliflozin Other (See Comments)   recurrent vaginitis   Glucophage [metformin] Other (See Comments)   GI intolerance   Niaspan [niacin] Other (See Comments)   flushing        Medication List     TAKE these medications    amLODipine 2.5 MG tablet Commonly known as: NORVASC TAKE 1 TABLET BY MOUTH EVERY DAY   aspirin EC 81 MG tablet Take 81 mg by mouth daily. Swallow whole.   benazepril 20 MG tablet Commonly known as: LOTENSIN Take 20 mg by mouth at bedtime.   celecoxib 200 MG capsule Commonly known as: CELEBREX Take 1 capsule (200 mg total) by mouth every 12 (twelve) hours.   Dulaglutide 3 MG/0.5ML Sopn Inject 3 mg into the skin once a week.   famciclovir 500 MG tablet Commonly known as: FAMVIR Take 1 tablet (500 mg total) by mouth daily.   hydrochlorothiazide 25 MG tablet Commonly known as: HYDRODIURIL Take 25 mg by mouth at bedtime.   HYDROcodone-acetaminophen 5-325 MG tablet Commonly known as: NORCO/VICODIN Take 1 tablet by mouth every 4 (four) hours as needed for moderate pain ((score 4 to 6)).   insulin regular human CONCENTRATED 500 UNIT/ML injection Commonly known as: HUMULIN R Inject 50-90 Units into the skin See admin instructions. Inject 80 units subcutaneously before breakfast, and 50 units before lunch and 90 for supper   methocarbamol 500 MG tablet Commonly known as: ROBAXIN Take 1 tablet (500 mg total) by mouth every 6 (six) hours as needed for muscle  spasms.   multivitamin with minerals tablet Take 1 tablet by mouth daily.   Potassium 99 MG Tabs Take 99 mg by mouth daily.   rosuvastatin 10 MG tablet Commonly known as: CRESTOR Take 10 mg by mouth at bedtime.   Vitamin D3 25 MCG tablet Commonly known as: Vitamin D Take 1,000 Units by mouth daily.               Durable Medical Equipment  (From admission, onward)           Start     Ordered   12/09/21 0754  DME Gilford Rile  Once       Question Answer Comment  Walker: With 5 Inch Wheels   Patient needs a walker to treat with the following condition Gait disturbance      12/09/21 0755   12/09/21 0754  DME 3-in-1  Once  12/09/21 0755   12/08/21 1444  DME Walker rolling  Once       Question:  Patient needs a walker to treat with the following condition  Answer:  S/P lumbar fusion   12/08/21 1443   12/08/21 1444  DME 3 n 1  Once        12/08/21 1443            Disposition: Home   Final Dx: Posterior lumbar interbody fusion L1-2 L2 3  Discharge Instructions      Remove dressing in 72 hours   Complete by: As directed    Call MD for:  difficulty breathing, headache or visual disturbances   Complete by: As directed    Call MD for:  persistant nausea and vomiting   Complete by: As directed    Call MD for:  redness, tenderness, or signs of infection (pain, swelling, redness, odor or green/yellow discharge around incision site)   Complete by: As directed    Call MD for:  severe uncontrolled pain   Complete by: As directed    Call MD for:  temperature >100.4   Complete by: As directed    Diet - low sodium heart healthy   Complete by: As directed    Increase activity slowly   Complete by: As directed         Follow-up Information     Eustace Moore, MD. Schedule an appointment as soon as possible for a visit in 2 week(s).   Specialty: Neurosurgery Contact information: [address] [place] [address] [place] [address] [phone]                   Signed: Eustace Moore 12/09/2021, 7:56 AM

## 2021-12-09 NOTE — Discharge Instructions (Signed)

## 2021-12-09 NOTE — Evaluation (Signed)
Occupational Therapy Evaluation Patient Details Name: Valerie Cline MRN: [id number] DOB: February 07, 1956 Today's Date: 12/09/2021   History of Present Illness 66 y.o. F admitted on 12/08/21 s/p PLIF. PMH significant for DM2, HTN, Lymphoma,Atherosclerotic heart disease.   Clinical Impression   Pt admitted for concerns listed above. PTA pt reported that she was independent with all ADL's and IADL's. At this time, pt requiring increased time to complete ADL's safely, however no physical assist is needed. Pt following all spinal precautions and compensatory strategies. She has no further OT needs at this time and acute OT will sign off.       Recommendations for follow up therapy are one component of a multi-disciplinary discharge planning process, led by the attending physician.  Recommendations may be updated based on patient status, additional functional criteria and insurance authorization.   Follow Up Recommendations  No OT follow up    Assistance Recommended at Discharge Set up Supervision/Assistance  Patient can return home with the following A little help with bathing/dressing/bathroom    Functional Status Assessment  Patient has had a recent decline in their functional status and demonstrates the ability to make significant improvements in function in a reasonable and predictable amount of time.  Equipment Recommendations  None recommended by OT    Recommendations for Other Services       Precautions / Restrictions Precautions Precautions: Back Precaution Booklet Issued: Yes (comment) Precaution Comments: Reviewed precautions and educated on compensatory strategies. Required Braces or Orthoses: Spinal Brace Spinal Brace: Lumbar corset Restrictions Weight Bearing Restrictions: No      Mobility Bed Mobility Overal bed mobility: Modified Independent             General bed mobility comments: Pt completed log roll with no assist this session    Transfers Overall  transfer level: Modified independent Equipment used: None               General transfer comment: No difficulties      Balance Overall balance assessment: Mild deficits observed, not formally tested                                         ADL either performed or assessed with clinical judgement   ADL Overall ADL's : Modified independent                                       General ADL Comments: Pt able to complete all ADL's with no physical assist, taking her time and following compensatory strategies     Vision Baseline Vision/History: 1 Wears glasses Ability to See in Adequate Light: 0 Adequate Patient Visual Report: No change from baseline Vision Assessment?: No apparent visual deficits     Perception     Praxis      Pertinent Vitals/Pain Pain Assessment Pain Assessment: 0-10 Pain Score: 3  Pain Location: No pain Pain Descriptors / Indicators: Aching, Discomfort, Grimacing Pain Intervention(s): Limited activity within patient's tolerance, Monitored during session, Repositioned     Hand Dominance Right   Extremity/Trunk Assessment Upper Extremity Assessment Upper Extremity Assessment: Overall WFL for tasks assessed   Lower Extremity Assessment Lower Extremity Assessment: Defer to PT evaluation   Cervical / Trunk Assessment Cervical / Trunk Assessment: Back Surgery   Communication Communication Communication: No difficulties   Cognition  Arousal/Alertness: Awake/alert Behavior During Therapy: WFL for tasks assessed/performed Overall Cognitive Status: Within Functional Limits for tasks assessed                                       General Comments  VSS on RA, dressing clean and intact    Exercises     Shoulder Instructions      Home Living Family/patient expects to be discharged to:: Private residence Living Arrangements: Spouse/significant other Available Help at Discharge: Family;Available  24 hours/day Type of Home: House Home Access: Stairs to enter [employer] of Steps: 2 steps Entrance Stairs-Rails: Left Home Layout: Two level Alternate Level Stairs-Number of Steps: Split Level - 6 steps to main floor Alternate Level Stairs-Rails: Left Bathroom Shower/Tub: Occupational psychologist: Handicapped height     Home Equipment: Conservation officer, nature (2 wheels)          Prior Functioning/Environment Prior Level of Function : Independent/Modified Independent             Mobility Comments: Pt reports intermittent use of RW, mainly due to pain ADLs Comments: Indep.        OT Problem List: Decreased strength;Decreased activity tolerance;Impaired balance (sitting and/or standing);Pain      OT Treatment/Interventions:      OT Goals(Current goals can be found in the care plan section) Acute Rehab OT Goals Patient Stated Goal: To go home OT Goal Formulation: With patient Time For Goal Achievement: 12/09/21 Potential to Achieve Goals: Good  OT Frequency:      Co-evaluation              AM-PAC OT "6 Clicks" Daily Activity     Outcome Measure Help from another person eating meals?: None Help from another person taking care of personal grooming?: None Help from another person toileting, which includes using toliet, bedpan, or urinal?: None Help from another person bathing (including washing, rinsing, drying)?: None Help from another person to put on and taking off regular upper body clothing?: None Help from another person to put on and taking off regular lower body clothing?: None 6 Click Score: 24   End of Session Equipment Utilized During Treatment: Back brace Nurse Communication: Mobility status  Activity Tolerance: Patient tolerated treatment well Patient left: in bed;with call bell/phone within reach;with family/visitor present (Sitting EOB)  OT Visit Diagnosis: Unsteadiness on feet (R26.81);Other abnormalities of gait and mobility  (R26.89);Muscle weakness (generalized) (M62.81)                Time: 8338-2505 OT Time Calculation (min): 21 min Charges:  OT General Charges $OT Visit: 1 Visit OT Evaluation $OT Eval Moderate Complexity: 1 Mod  Mills Mitton H., OTR/L Acute Rehabilitation  Delara Shepheard Elane Yolanda Bonine 12/09/2021, 9:52 AM

## 2021-12-16 ENCOUNTER — Other Ambulatory Visit: Payer: Self-pay | Admitting: *Deleted

## 2021-12-16 MED ORDER — FAMCICLOVIR 500 MG PO TABS: 500.0000 mg | ORAL_TABLET | Freq: Every day | ORAL | 11 refills | Status: AC

## 2021-12-21 ENCOUNTER — Other Ambulatory Visit: Payer: Self-pay | Admitting: Hematology & Oncology

## 2022-01-03 ENCOUNTER — Inpatient Hospital Stay: Payer: Medicare Other

## 2022-01-03 ENCOUNTER — Other Ambulatory Visit: Payer: Self-pay

## 2022-01-03 ENCOUNTER — Encounter: Payer: Self-pay | Admitting: Family

## 2022-01-03 ENCOUNTER — Inpatient Hospital Stay: Payer: Medicare Other | Admitting: Family

## 2022-01-03 ENCOUNTER — Inpatient Hospital Stay: Payer: Medicare Other | Attending: Hematology & Oncology

## 2022-01-03 VITALS — BP 133/58 | HR 85 | Temp 98.9°F | Resp 18 | Wt 175.0 lb

## 2022-01-03 VITALS — BP 113/48 | HR 77 | Temp 99.0°F | Resp 17

## 2022-01-03 DIAGNOSIS — E876 Hypokalemia: Secondary | ICD-10-CM

## 2022-01-03 DIAGNOSIS — Z79899 Other long term (current) drug therapy: Secondary | ICD-10-CM | POA: Diagnosis not present

## 2022-01-03 DIAGNOSIS — Z5112 Encounter for antineoplastic immunotherapy: Secondary | ICD-10-CM | POA: Insufficient documentation

## 2022-01-03 DIAGNOSIS — C8308 Small cell B-cell lymphoma, lymph nodes of multiple sites: Secondary | ICD-10-CM | POA: Insufficient documentation

## 2022-01-03 LAB — CMP (CANCER CENTER ONLY)
ALT: 24 U/L (ref 0–44)
AST: 24 U/L (ref 15–41)
Albumin: 4 g/dL (ref 3.5–5.0)
Alkaline Phosphatase: 106 U/L (ref 38–126)
Anion gap: 10 (ref 5–15)
BUN: 15 mg/dL (ref 8–23)
CO2: 34 mmol/L — ABNORMAL HIGH (ref 22–32)
Calcium: 9.4 mg/dL (ref 8.9–10.3)
Chloride: 95 mmol/L — ABNORMAL LOW (ref 98–111)
Creatinine: 0.67 mg/dL (ref 0.44–1.00)
GFR, Estimated: >60 mL/min (ref >60)
Glucose, Bld: 201 mg/dL — ABNORMAL HIGH (ref 70–99)
Potassium: 2.9 mmol/L — ABNORMAL LOW (ref 3.5–5.1)
Sodium: 139 mmol/L (ref 135–145)
Total Bilirubin: 0.7 mg/dL (ref 0.3–1.2)
Total Protein: 6.6 g/dL (ref 6.5–8.1)

## 2022-01-03 LAB — CBC WITH DIFFERENTIAL (CANCER CENTER ONLY)
Abs Immature Granulocytes: 0.03 10*3/uL (ref 0.00–0.07)
Basophils Absolute: 0 10*3/uL (ref 0.0–0.1)
Basophils Relative: 0 %
Eosinophils Absolute: 0.3 10*3/uL (ref 0.0–0.5)
Eosinophils Relative: 4 %
HCT: 34.9 % — ABNORMAL LOW (ref 36.0–46.0)
Hemoglobin: 11.5 g/dL — ABNORMAL LOW (ref 12.0–15.0)
Immature Granulocytes: 0 %
Lymphocytes Relative: 19 %
Lymphs Abs: 1.5 10*3/uL (ref 0.7–4.0)
MCH: 25 pg — ABNORMAL LOW (ref 26.0–34.0)
MCHC: 33 g/dL (ref 30.0–36.0)
MCV: 75.9 fL — ABNORMAL LOW (ref 80.0–100.0)
Monocytes Absolute: 0.7 10*3/uL (ref 0.1–1.0)
Monocytes Relative: 10 %
Neutro Abs: 5.2 10*3/uL (ref 1.7–7.7)
Neutrophils Relative %: 67 %
Platelet Count: 273 10*3/uL (ref 150–400)
RBC: 4.6 MIL/uL (ref 3.87–5.11)
RDW: 14 % (ref 11.5–15.5)
WBC Count: 7.7 10*3/uL (ref 4.0–10.5)
nRBC: 0 % (ref 0.0–0.2)

## 2022-01-03 LAB — LACTATE DEHYDROGENASE: LDH: 133 U/L (ref 98–192)

## 2022-01-03 MED ORDER — DIPHENHYDRAMINE HCL 25 MG PO CAPS
50.0000 mg | ORAL_CAPSULE | Freq: Once | ORAL | Status: AC
  Administered 2022-01-03: 50 mg via ORAL
  Filled 2022-01-03: qty 2

## 2022-01-03 MED ORDER — SODIUM CHLORIDE 0.9 % IV SOLN: Freq: Once | INTRAVENOUS | Status: AC

## 2022-01-03 MED ORDER — ACETAMINOPHEN 325 MG PO TABS
650.0000 mg | ORAL_TABLET | Freq: Once | ORAL | Status: AC
  Administered 2022-01-03: 650 mg via ORAL
  Filled 2022-01-03: qty 2

## 2022-01-03 MED ORDER — HEPARIN SOD (PORK) LOCK FLUSH 100 UNIT/ML IV SOLN: 500.0000 [IU] | Freq: Once | INTRAVENOUS | Status: DC | PRN

## 2022-01-03 MED ORDER — POTASSIUM CHLORIDE CRYS ER 20 MEQ PO TBCR
40.0000 meq | EXTENDED_RELEASE_TABLET | Freq: Once | ORAL | Status: AC
  Administered 2022-01-03: 40 meq via ORAL
  Filled 2022-01-03: qty 2

## 2022-01-03 MED ORDER — SODIUM CHLORIDE 0.9 % IV SOLN
375.0000 mg/m2 | Freq: Once | INTRAVENOUS | Status: AC
  Administered 2022-01-03: 700 mg via INTRAVENOUS
  Filled 2022-01-03: qty 50

## 2022-01-03 MED ORDER — SODIUM CHLORIDE 0.9% FLUSH: 10.0000 mL | INTRAVENOUS | Status: DC | PRN

## 2022-01-03 NOTE — Patient Instructions (Signed)
[facility]  Discharge Instructions: ?Thank you for choosing [facility] to provide your oncology and hematology care.  ? ?If you have a lab appointment with the Cancer Center, please go directly to the Cancer Center and check in at the registration area. ? ?Wear comfortable clothing and clothing appropriate for easy access to any Portacath or PICC line.  ? ?We strive to give you quality time with your provider. You may need to reschedule your appointment if you arrive late (15 or more minutes).  Arriving late affects you and other patients whose appointments are after yours.  Also, if you miss three or more appointments without notifying the office, you may be dismissed from the clinic at the provider?s discretion.    ?  ?For prescription refill requests, have your pharmacy contact our office and allow 72 hours for refills to be completed.   ? ?Today you received the following chemotherapy and/or immunotherapy agents Rituxan  ?  ?To help prevent nausea and vomiting after your treatment, we encourage you to take your nausea medication as directed. ? ?BELOW ARE SYMPTOMS THAT SHOULD BE REPORTED IMMEDIATELY: ?*FEVER GREATER THAN 100.4 F (38 ?C) OR HIGHER ?*CHILLS OR SWEATING ?*NAUSEA AND VOMITING THAT IS NOT CONTROLLED WITH YOUR NAUSEA MEDICATION ?*UNUSUAL SHORTNESS OF BREATH ?*UNUSUAL BRUISING OR BLEEDING ?*URINARY PROBLEMS (pain or burning when urinating, or frequent urination) ?*BOWEL PROBLEMS (unusual diarrhea, constipation, pain near the anus) ?TENDERNESS IN MOUTH AND THROAT WITH OR WITHOUT PRESENCE OF ULCERS (sore throat, sores in mouth, or a toothache) ?UNUSUAL RASH, SWELLING OR PAIN  ?UNUSUAL VAGINAL DISCHARGE OR ITCHING  ? ?Items with * indicate a potential emergency and should be followed up as soon as possible or go to the Emergency Department if any problems should occur. ? ?Please show the CHEMOTHERAPY ALERT CARD or IMMUNOTHERAPY ALERT CARD at check-in to the  Emergency Department and triage nurse. ?Should you have questions after your visit or need to cancel or reschedule your appointment, please contact [facility]  [phone] and follow the prompts.  Office hours are 8:00 a.m. to 4:30 p.m. Monday - Friday. Please note that voicemails left after 4:00 p.m. may not be returned until the following business day.  We are closed weekends and major holidays. You have access to a nurse at all times for urgent questions. Please call the main number to the clinic [phone] and follow the prompts. ? ?For any non-urgent questions, you may also contact your provider using MyChart. We now offer e-Visits for anyone 18 and older to request care online for non-urgent symptoms. For details visit [email or web address]. ?  ?Also download the MyChart app! Go to the app store, search "MyChart", open the app, select [facility], and log in with your MyChart username and password. ? ?Due to Covid, a mask is required upon entering the hospital/clinic. If you do not have a mask, one will be given to you upon arrival. For doctor visits, patients may have 1 support person aged 18 or older with them. For treatment visits, patients cannot have anyone with them due to current Covid guidelines and our immunocompromised population.  ?

## 2022-01-03 NOTE — Progress Notes (Signed)
?Hematology and Oncology Follow Up Visit ? ?Valerie Cline ?[id number] ?[birth year] 66 y.o. ?01/03/2022 ? ? 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  ?Current Therapy:        ?Rituxan/Bendamustine -- s/p cycle 6 - started on 02/13/2020 ?Rituxan -- Maintanenence -- q 3 months, started on 09/03/2020 ?  ?Interim History:  Valerie Cline is here today for follow-up and treatment. She is doing well but states that she occasionally has a burned tongue sensation. She burned her tongue a month or so ago. This comes and goes.  ?No issues with speech or swallowing.  ?She has been eating well and doing her best to stay well hydrated throughout the day.  ?Her weight is 175 lbs.  ?No fever, chills, n/v, cough, rash, dizziness, SOB, chest pain, palpitations, abdominal pain or changes in bowel or bladder habits.  ?No swelling, tenderness, numbness or tingling in her extremities at this time.  ?No falls or syncope.  ? ?ECOG Performance Status: 1 - Symptomatic but completely ambulatory ? ?Medications:  ?Allergies as of 01/03/2022   ? ?   Reactions  ? Canagliflozin Other (See Comments)  ? recurrent vaginitis  ? Glucophage [metformin] Other (See Comments)  ? GI intolerance  ? Niaspan [niacin] Other (See Comments)  ? flushing  ? ?  ? ?  ?Medication List  ?  ? ?  ? Accurate as of January 03, 2022 10:25 AM. If you have any questions, ask your nurse or doctor.  ?  ?  ? ?  ? ?amLODipine 2.5 MG tablet ?Commonly known as: NORVASC ?TAKE 1 TABLET BY MOUTH EVERY DAY ?  ?aspirin EC 81 MG tablet ?Take 81 mg by mouth daily. Swallow whole. ?  ?benazepril 20 MG tablet ?Commonly known as: LOTENSIN ?Take 20 mg by mouth at bedtime. ?  ?celecoxib 200 MG capsule ?Commonly known as: CELEBREX ?Take 1 capsule (200 mg total) by mouth every 12 (twelve) hours. ?  ?Dulaglutide 3 MG/0.5ML Sopn ?Inject 3 mg into the skin once a week. ?  ?famciclovir 500 MG tablet ?Commonly known as: FAMVIR ?Take 1 tablet (500 mg total) by mouth  daily. ?  ?hydrochlorothiazide 25 MG tablet ?Commonly known as: HYDRODIURIL ?Take 25 mg by mouth at bedtime. ?  ?HYDROcodone-acetaminophen 5-325 MG tablet ?Commonly known as: NORCO/VICODIN ?Take 1 tablet by mouth every 4 (four) hours as needed for moderate pain ((score 4 to 6)). ?  ?insulin regular human CONCENTRATED 500 UNIT/ML injection ?Commonly known as: HUMULIN R ?Inject 50-90 Units into the skin See admin instructions. Inject 80 units subcutaneously before breakfast, and 50 units before lunch and 90 for supper ?  ?methocarbamol 500 MG tablet ?Commonly known as: ROBAXIN ?Take 1 tablet (500 mg total) by mouth every 6 (six) hours as needed for muscle spasms. ?  ?multivitamin with minerals tablet ?Take 1 tablet by mouth daily. ?  ?Potassium 99 MG Tabs ?Take 99 mg by mouth daily. ?  ?rosuvastatin 10 MG tablet ?Commonly known as: CRESTOR ?Take 10 mg by mouth at bedtime. ?  ?Vitamin D3 25 MCG tablet ?Commonly known as: Vitamin D ?Take 1,000 Units by mouth daily. ?  ? ?  ? ? ?Allergies:  ?Allergies  ?Allergen Reactions  ? Canagliflozin Other (See Comments)  ?  recurrent vaginitis  ? Glucophage [Metformin] Other (See Comments)  ?  GI intolerance  ? Niaspan [Niacin] Other (See Comments)  ?  flushing  ? ? ?Past Medical History, Surgical history, Social history, and Family History  were reviewed and updated. ? ?Review of Systems: ?All other 10 point review of systems is negative.  ? ?Physical Exam: ? vitals were not taken for this visit.  ? ?Wt Readings from Last 3 Encounters:  ?12/08/21 180 lb (81.6 kg)  ?11/30/21 179 lb 9.6 oz (81.5 kg)  ?11/16/21 180 lb 1.6 oz (81.7 kg)  ? ? ?Ocular: Sclerae unicteric, pupils equal, round and reactive to light ?Ear-nose-throat: Oropharynx clear, dentition fair ?Lymphatic: No cervical or supraclavicular adenopathy ?Lungs no rales or rhonchi, good excursion bilaterally ?Heart regular rate and rhythm, no murmur appreciated ?Abd soft, nontender, positive bowel sounds ?MSK no focal spinal  tenderness, no joint edema ?Neuro: non-focal, well-oriented, appropriate affect ?Breasts:  ? ?Lab Results  ?Component Value Date  ? WBC 7.7 01/03/2022  ? HGB 11.5 (L) 01/03/2022  ? HCT 34.9 (L) 01/03/2022  ? MCV 75.9 (L) 01/03/2022  ? PLT 273 01/03/2022  ? ?Lab Results  ?Component Value Date  ? FERRITIN 305 11/11/2019  ? ?Lab Results  ?Component Value Date  ? RBC 4.60 01/03/2022  ? ?Lab Results  ?Component Value Date  ? KPAFRELGTCHN 131.9 (H) 12/30/2019  ? LAMBDASER 29.8 (H) 12/30/2019  ? KAPLAMBRATIO 4.43 (H) 12/30/2019  ? ?Lab Results  ?Component Value Date  ? IGGSERUM 471 (L) 09/16/2021  ? IGA 141 09/16/2021  ? IGMSERUM 121 09/16/2021  ? ?Lab Results  ?Component Value Date  ? TOTALPROTELP 6.3 09/16/2021  ? ALBUMINELP 3.4 09/16/2021  ? A1GS 0.3 09/16/2021  ? A2GS 1.1 (H) 09/16/2021  ? BETS 1.0 09/16/2021  ? GAMS 0.5 09/16/2021  ? MSPIKE Not Observed 09/16/2021  ? ?  Chemistry   ?   ?Component Value Date/Time  ? NA 139 01/03/2022 0946  ? NA 141 11/30/2021 1643  ? K 2.9 (L) 01/03/2022 0946  ? CL 95 (L) 01/03/2022 0946  ? CO2 34 (H) 01/03/2022 0946  ? BUN 15 01/03/2022 0946  ? BUN 15 11/30/2021 1643  ? CREATININE 0.67 01/03/2022 0946  ?    ?Component Value Date/Time  ? CALCIUM 9.4 01/03/2022 0946  ? ALKPHOS 106 01/03/2022 0946  ? AST 24 01/03/2022 0946  ? ALT 24 01/03/2022 0946  ? BILITOT 0.7 01/03/2022 0946  ?  ? ? ? ?Impression and Plan: Valerie Cline is a very pleasant 66 yo caucasian female with small lymphocytic B cell non-Hodgkin's lymphoma. She completed 6 cycles of Rituxan/Bendamustine in September 2021 and is now on maintenance Rituxan therapy.  ?Potassium is again low at 2.9. I spoke with Dr. Marin Olp and we will give 40 meq PO now and again before she leaves today to go home. She has been taking 99 mg potassium OTC supplement daily and will increase to 2 tablets PO daily.  ?We will proceed with treatment today as planned.  ?Follow-up in 3 months.  ? ?Lottie Dawson, NP ?3/6/202310:25 AM ? ?

## 2022-01-04 LAB — IGG, IGA, IGM
IgA: 88 mg/dL (ref 87–352)
IgG (Immunoglobin G), Serum: 477 mg/dL — ABNORMAL LOW (ref 586–1602)
IgM (Immunoglobulin M), Srm: 109 mg/dL (ref 26–217)

## 2022-01-05 LAB — PROTEIN ELECTROPHORESIS, SERUM
A/G Ratio: 1.2 (ref 0.7–1.7)
Albumin ELP: 3.4 g/dL (ref 2.9–4.4)
Alpha-1-Globulin: 0.3 g/dL (ref 0.0–0.4)
Alpha-2-Globulin: 1.1 g/dL — ABNORMAL HIGH (ref 0.4–1.0)
Beta Globulin: 0.9 g/dL (ref 0.7–1.3)
Gamma Globulin: 0.6 g/dL (ref 0.4–1.8)
Globulin, Total: 2.9 g/dL (ref 2.2–3.9)
Total Protein ELP: 6.3 g/dL (ref 6.0–8.5)

## 2022-01-07 ENCOUNTER — Telehealth: Payer: Self-pay | Admitting: *Deleted

## 2022-01-07 NOTE — Telephone Encounter (Signed)
Per 01/03/22 los - called and gave upcoming appointments - confirmed ?

## 2022-01-12 NOTE — Progress Notes (Addendum)
?Cardiology Office Note:   ? ?Date:  01/13/2022  ? ?ID:  Valerie Cline, DOB Oct 18, 1956, MRN [id number] ? ?PCP:  Ginger Organ., MD  ?Cardiologist:  Candee Furbish, MD   ? ?Referring MD: Ginger Organ., MD  ? ? ? ?History of Present Illness:   ? ?Valerie Cline is a 66 y.o. female Valerie Cline, her daughter is a Psychologist, counselling at [facility] radiology now) with a hx of hypertension, dyslipidemia, atherosclerosis of coronary arteries, and diabetes mellitus here today to review echo results. She is hoping to switch to [facility]. ? ?She saw Dr. Geraldo Pitter 11/30/21 for pre-operative clearance for orthopedic surgery. Due to coronary artery disease, Lexiscan stress test was ordered but not yet updated in Epic. It was performed in [facility]. Stress test from 12/06/21 and was negative, low risk, with EF 82%. She underwent the posterior lumbar interbody fusion of L1-2 L2-3 on 12/08/21.  ? ?Today, she is accompanied by her daughter and doing well. She used to have pain in her bilateral pain. After the surgery, the pain has resolved. She is still building up strength after surgery through physical therapy. Her daughter mentions she used to brace against people because of the pain. She still has to hold on to someone while walking but not as tightly.  ? ?She has been told the majority of her plaque is in the LAD. She wonders if this is something to be worried about.  ? ?She used to take [place] instead of HCTZ. While taking [place], she felt dehydrated and decided to go back to HCTZ.  ? ?At night, when she lays down to sleep, she will hear her heart beating in her R ear. Occasionally, the sound will prevent her from sleeping. Her daughter works in the vascular center and reports her carotids are fine.  ? ?She has never smoked. Her mother had congestive heart failure but no CAD. Her father also did not have CAD. ? ?She denies any chest pain, or shortness of breath, lightheadedness, headaches, syncope, orthopnea, PND, or  lower extremity edema. ? ?Past Medical History:  ?Diagnosis Date  ? Allergy   ? Atherosclerotic heart disease of native coronary artery without angina pectoris 01/13/2020  ? Blood in stool   ? Carotid artery occlusion 08/13/2009  ? Carotid stenosis   ? Cervical lymphadenopathy 10/31/2019  ? CLL (chronic lymphocytic leukemia) ([place]) 12/26/2019  ? Complication of anesthesia   ? Coronary artery calcification seen on CT scan 06/01/2020  ? Diabetes mellitus without complication ([place])   ? Diabetic peripheral neuropathy associated with type 2 diabetes mellitus ([place]) 06/08/2009  ? Elevated liver function tests   ? Essential hypertension 11/08/2019  ? Fatty liver 09/06/2012  ? Goals of care, counseling/discussion 10/28/2019  ? Hardening of the aorta (main artery of the heart) ([place]) 01/13/2020  ? History of adenomatous polyp of colon 03/16/2021  ? Hyperglycemia due to type 2 diabetes mellitus ([place]) 03/09/2021  ? Hyperlipidemia   ? Hypertension   ? Hypertriglyceridemia 03/16/2021  ? Long term (current) use of insulin ([place] [address]) 03/09/2021  ? Lumbar radiculopathy 11/30/2021  ? Lymphadenopathy, abdominal 10/28/2019  ? Malignant lymphoma, small lymphocytic ([place]) 11/13/2019  ? Mixed dyslipidemia 06/01/2020  ? Obesity 04/19/2012  ? Personal history of COVID-19 11/13/2019  ? Pneumonia due to COVID-19 virus 11/08/2019  ? PONV (postoperative nausea and vomiting)   ? 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 ([place]) 12/30/2019  ? T2DM (type 2 diabetes mellitus) ([place]) 11/08/2019  ?  Thyroid nodule 03/16/2021  ? Type 2 diabetes mellitus with unspecified complications ([place]) 26/37/8588  ? ? ?Past Surgical History:  ?Procedure Laterality Date  ? BREAST BIOPSY Right   ? needle biopsy  ? COLONOSCOPY    ? POLYPECTOMY    ? Ruptured disc    ? ? ?Current Medications: ?Current Meds  ?Medication Sig  ? amLODipine (NORVASC) 2.5 MG tablet TAKE 1 TABLET BY MOUTH EVERY DAY  ? aspirin EC 81 MG tablet Take 81 mg by mouth daily. Swallow whole.  ? benazepril  (LOTENSIN) 20 MG tablet Take 20 mg by mouth at bedtime.  ? Dulaglutide 3 MG/0.5ML SOPN Inject 3 mg into the skin once a week.  ? famciclovir (FAMVIR) 500 MG tablet Take 1 tablet (500 mg total) by mouth daily.  ? hydrochlorothiazide (HYDRODIURIL) 25 MG tablet Take 25 mg by mouth at bedtime.  ? insulin regular human CONCENTRATED (HUMULIN R) 500 UNIT/ML injection Inject 50-90 Units into the skin See admin instructions. Inject 80 units subcutaneously before breakfast, and 50 units before lunch and 90 for supper  ? Multiple Vitamins-Minerals (MULTIVITAMIN WITH MINERALS) tablet Take 1 tablet by mouth daily.  ? Potassium 99 MG TABS Take 99 mg by mouth daily.  ? rosuvastatin (CRESTOR) 20 MG tablet Take 1 tablet (20 mg total) by mouth daily.  ? Vitamin D3 (VITAMIN D) 25 MCG tablet Take 1,000 Units by mouth daily.  ? [DISCONTINUED] rosuvastatin (CRESTOR) 10 MG tablet Take 10 mg by mouth at bedtime.  ?  ? ?Allergies:   Canagliflozin, Glucophage [metformin], and Niaspan [niacin]  ? ?Social History  ? ?Socioeconomic History  ? Marital status: Married  ?  Spouse name: Not on file  ? Number of children: 1  ? Years of education: Not on file  ? Highest education level: Not on file  ?Occupational History  ? Occupation: Engineer, maintenance (IT)  ?Tobacco Use  ? Smoking status: Never  ? Smokeless tobacco: Never  ?Vaping Use  ? Vaping Use: Never used  ?Substance and Sexual Activity  ? Alcohol use: No  ? Drug use: No  ? Sexual activity: Not on file  ?Other Topics Concern  ? Not on file  ?Social History Narrative  ? Not on file  ? ?Social Determinants of Health  ? ?Financial Resource Strain: Not on file  ?Food Insecurity: Not on file  ?Transportation Needs: Not on file  ?Physical Activity: Not on file  ?Stress: Not on file  ?Social Connections: Not on file  ?  ? ?Family History: ?The patient's family history is negative for Colon cancer, Esophageal cancer, Rectal cancer, Stomach cancer, and Pancreatic cancer. ? ?ROS:   ?Please see the  history of present illness.    ?All other systems reviewed and negative.  ? ?EKGs/Labs/Other Studies Reviewed:   ? ?The following studies were reviewed today: ?Stress Test done at [facility] on 12/06/21: ?Impression: ?1. No reversible ischemia or infarction. ?2. Normal left ventricular wall motion. ?3. Left ventricular ejection fraction 82%. ?4. Noninvasive restratification: Low ? ?Echo 06/18/20 ?1. Left ventricular ejection fraction, by estimation, is 60 to 65%. The  ?left ventricle has normal function. The left ventricle has no regional  ?wall motion abnormalities. Left ventricular diastolic parameters are  ?consistent with Grade I diastolic dysfunction (impaired relaxation).  ? 2. Right ventricular systolic function is normal. The right ventricular  ?size is normal. There is normal pulmonary artery systolic pressure.  ? 3. The mitral valve is normal in structure. No evidence of mitral valve  ?  regurgitation. No evidence of mitral stenosis.  ? 4. The aortic valve is normal in structure. Aortic valve regurgitation is  ?trivial. Mild to moderate aortic valve sclerosis/calcification is present,  ?without any evidence of aortic stenosis.  ? 5. The inferior vena cava is normal in size with greater than 50%  ?respiratory variability, suggesting right atrial pressure of 3 mmHg.  ? ?Lexiscan 06/09/20 ?The left ventricular ejection fraction is hyperdynamic (>65%). ?Nuclear stress EF: 72%. ?There was no ST segment deviation noted during stress. ?The study is normal. ?This is a low risk study. ? ?CT Cardiac Score 04/30/20 ?IMPRESSION: ?Coronary calcium score of 1563 with significant calcification in the ?left anterior descending artery . This was 33 percentile for age and ?sex matched control. ?  ?There is severe mitral annular calcification. ? ?EKG:  EKG was personally reviewed ?01/13/22: Sinus rhythm, rate 85 bpm ? ?Recent Labs: ?01/03/2022: ALT 24; BUN 15; Creatinine 0.67; Hemoglobin 11.5; Platelet Count 273; Potassium 2.9;  Sodium 139  ? ?Recent Lipid Panel ?   ?Component Value Date/Time  ? TRIG 222 (H) 11/08/2019 1804  ? ? ?CHA2DS2-VASc Score =   '[ ]'$ .  Therefore, the patient's annual risk of stroke is   %.    ?  ? ? ?Physical

## 2022-01-13 ENCOUNTER — Other Ambulatory Visit: Payer: Self-pay

## 2022-01-13 ENCOUNTER — Encounter: Payer: Self-pay | Admitting: Cardiology

## 2022-01-13 ENCOUNTER — Ambulatory Visit: Payer: Medicare Other | Admitting: Cardiology

## 2022-01-13 VITALS — BP 148/72 | HR 85 | Ht 66.0 in | Wt 178.8 lb

## 2022-01-13 DIAGNOSIS — I6523 Occlusion and stenosis of bilateral carotid arteries: Secondary | ICD-10-CM | POA: Diagnosis not present

## 2022-01-13 DIAGNOSIS — I2510 Atherosclerotic heart disease of native coronary artery without angina pectoris: Secondary | ICD-10-CM | POA: Diagnosis not present

## 2022-01-13 DIAGNOSIS — Z79899 Other long term (current) drug therapy: Secondary | ICD-10-CM | POA: Diagnosis not present

## 2022-01-13 DIAGNOSIS — E782 Mixed hyperlipidemia: Secondary | ICD-10-CM | POA: Diagnosis not present

## 2022-01-13 MED ORDER — ROSUVASTATIN CALCIUM 20 MG PO TABS: 20.0000 mg | ORAL_TABLET | Freq: Every day | ORAL | 3 refills | Status: AC

## 2022-01-13 NOTE — Patient Instructions (Signed)
Medication Instructions:  ?Please increase Crestor to 20 mg a day. ?Continue all other medications as listed. ? ?*If you need a refill on your cardiac medications before your next appointment, please call your pharmacy* ? ?Lab Work: ?Please have blood work in 3 months (Lipid/ALT) ? ?If you have labs (blood work) drawn today and your tests are completely normal, you will receive your results only by: ?MyChart Message (if you have MyChart) OR ?A paper copy in the mail ?If you have any lab test that is abnormal or we need to change your treatment, we will call you to review the results. ? ?Follow-Up: ?At [facility], you and your health needs are our priority.  As part of our continuing mission to provide you with exceptional heart care, we have created designated Provider Care Teams.  These Care Teams include your primary Cardiologist (physician) and Advanced Practice Providers (APPs -  Physician Assistants and Nurse Practitioners) who all work together to provide you with the care you need, when you need it. ? ?We recommend signing up for the patient portal called "MyChart".  Sign up information is provided on this After Visit Summary.  MyChart is used to connect with patients for Virtual Visits (Telemedicine).  Patients are able to view lab/test results, encounter notes, upcoming appointments, etc.  Non-urgent messages can be sent to your provider as well.   ?To learn more about what you can do with MyChart, go to [email or web address].   ? ?Your next appointment:   ?6 month(s) ? ?The format for your next appointment:   ?In Person ? ?Provider:   ?Dr Candee Furbish ? ?If primary card or EP is not listed click here to update    :1}  ? ? ?Thank you for choosing [facility]!! ? ? ? ?

## 2022-01-13 NOTE — Assessment & Plan Note (Signed)
Continue to treat diabetes.  Last hemoglobin A1c 7.3 from outside labs.  Creatinine 0.6.  Excellent. ?

## 2022-01-13 NOTE — Assessment & Plan Note (Signed)
From 2019, mild nonflow limiting plaque bilaterally.  Intensifying statin.  Continue with aspirin 81. ?

## 2022-01-13 NOTE — Assessment & Plan Note (Signed)
Calcium score 1500, 99th percentile.  Personally reviewed and interpreted study and showed patient and her daughter Sharee Pimple who is a vascular tech.  Significant three-vessel coronary artery disease noted.  As I described to them, unable to tell if there is any luminal stenosis present.  Thankfully she is not describing any significant chest discomfort or anginal type symptoms.  She just had lumbar spine surgery in February 2023.  We will go ahead and encourage her to perform physical therapy and exercise.  Obviously if symptoms develop, we will have low threshold for diagnostic angiogram.  In fact, we could consider diagnostic angiogram in 6 months if desired as significant triple-vessel disease would potentially change her management strategy.  I would like for her to heal from her lumbar spine surgery at this time first.  We will revisit in 6 months. ?- I will increase her Crestor from 10 mg up to 20 mg a day.  Her last LDL was 72.  We will recheck in 3 months.  LDL goal less than 70. ?

## 2022-03-04 ENCOUNTER — Other Ambulatory Visit: Payer: Self-pay | Admitting: Internal Medicine

## 2022-03-04 DIAGNOSIS — I6529 Occlusion and stenosis of unspecified carotid artery: Secondary | ICD-10-CM

## 2022-03-10 ENCOUNTER — Ambulatory Visit
Admission: RE | Admit: 2022-03-10 | Discharge: 2022-03-10 | Disposition: A | Discharge status: Home / Self Care | Payer: Medicare Other | Source: Ambulatory Visit | Attending: Internal Medicine | Admitting: Internal Medicine

## 2022-03-10 ENCOUNTER — Encounter: Payer: Self-pay | Admitting: Hematology & Oncology

## 2022-03-10 DIAGNOSIS — I6529 Occlusion and stenosis of unspecified carotid artery: Secondary | ICD-10-CM

## 2022-04-05 ENCOUNTER — Inpatient Hospital Stay: Payer: Medicare Other | Admitting: Hematology & Oncology

## 2022-04-05 ENCOUNTER — Encounter: Payer: Self-pay | Admitting: Hematology & Oncology

## 2022-04-05 ENCOUNTER — Inpatient Hospital Stay: Payer: Medicare Other | Attending: Hematology & Oncology

## 2022-04-05 ENCOUNTER — Inpatient Hospital Stay: Payer: Medicare Other

## 2022-04-05 VITALS — BP 132/72 | HR 70 | Resp 18

## 2022-04-05 VITALS — BP 167/64 | HR 72 | Temp 97.8°F | Resp 20 | Ht 66.0 in | Wt 179.4 lb

## 2022-04-05 DIAGNOSIS — C8308 Small cell B-cell lymphoma, lymph nodes of multiple sites: Secondary | ICD-10-CM

## 2022-04-05 DIAGNOSIS — Z79899 Other long term (current) drug therapy: Secondary | ICD-10-CM | POA: Insufficient documentation

## 2022-04-05 DIAGNOSIS — Z5112 Encounter for antineoplastic immunotherapy: Secondary | ICD-10-CM | POA: Diagnosis present

## 2022-04-05 DIAGNOSIS — E876 Hypokalemia: Secondary | ICD-10-CM

## 2022-04-05 LAB — LACTATE DEHYDROGENASE: LDH: 157 U/L (ref 98–192)

## 2022-04-05 LAB — CBC WITH DIFFERENTIAL (CANCER CENTER ONLY)
Abs Immature Granulocytes: 0.02 10*3/uL (ref 0.00–0.07)
Basophils Absolute: 0 10*3/uL (ref 0.0–0.1)
Basophils Relative: 0 %
Eosinophils Absolute: 0.1 10*3/uL (ref 0.0–0.5)
Eosinophils Relative: 2 %
HCT: 40.7 % (ref 36.0–46.0)
Hemoglobin: 12.9 g/dL (ref 12.0–15.0)
Immature Granulocytes: 0 %
Lymphocytes Relative: 18 %
Lymphs Abs: 1.3 10*3/uL (ref 0.7–4.0)
MCH: 24.4 pg — ABNORMAL LOW (ref 26.0–34.0)
MCHC: 31.7 g/dL (ref 30.0–36.0)
MCV: 76.9 fL — ABNORMAL LOW (ref 80.0–100.0)
Monocytes Absolute: 0.7 10*3/uL (ref 0.1–1.0)
Monocytes Relative: 10 %
Neutro Abs: 5.1 10*3/uL (ref 1.7–7.7)
Neutrophils Relative %: 70 %
Platelet Count: 222 10*3/uL (ref 150–400)
RBC: 5.29 MIL/uL — ABNORMAL HIGH (ref 3.87–5.11)
RDW: 14.9 % (ref 11.5–15.5)
WBC Count: 7.3 10*3/uL (ref 4.0–10.5)
nRBC: 0 % (ref 0.0–0.2)

## 2022-04-05 LAB — CMP (CANCER CENTER ONLY)
ALT: 28 U/L (ref 0–44)
AST: 24 U/L (ref 15–41)
Albumin: 4.3 g/dL (ref 3.5–5.0)
Alkaline Phosphatase: 85 U/L (ref 38–126)
Anion gap: 11 (ref 5–15)
BUN: 15 mg/dL (ref 8–23)
CO2: 29 mmol/L (ref 22–32)
Calcium: 9.9 mg/dL (ref 8.9–10.3)
Chloride: 100 mmol/L (ref 98–111)
Creatinine: 0.76 mg/dL (ref 0.44–1.00)
GFR, Estimated: >60 mL/min (ref >60)
Glucose, Bld: 247 mg/dL — ABNORMAL HIGH (ref 70–99)
Potassium: 3.5 mmol/L (ref 3.5–5.1)
Sodium: 140 mmol/L (ref 135–145)
Total Bilirubin: 0.6 mg/dL (ref 0.3–1.2)
Total Protein: 6.8 g/dL (ref 6.5–8.1)

## 2022-04-05 MED ORDER — SODIUM CHLORIDE 0.9 % IV SOLN
375.0000 mg/m2 | Freq: Once | INTRAVENOUS | Status: AC
  Administered 2022-04-05: 700 mg via INTRAVENOUS
  Filled 2022-04-05: qty 50

## 2022-04-05 MED ORDER — ACETAMINOPHEN 325 MG PO TABS
650.0000 mg | ORAL_TABLET | Freq: Once | ORAL | Status: AC
  Administered 2022-04-05: 650 mg via ORAL
  Filled 2022-04-05: qty 2

## 2022-04-05 MED ORDER — DIPHENHYDRAMINE HCL 25 MG PO CAPS
50.0000 mg | ORAL_CAPSULE | Freq: Once | ORAL | Status: AC
  Administered 2022-04-05: 50 mg via ORAL
  Filled 2022-04-05: qty 2

## 2022-04-05 MED ORDER — SODIUM CHLORIDE 0.9 % IV SOLN: Freq: Once | INTRAVENOUS | Status: AC

## 2022-04-05 NOTE — Patient Instructions (Signed)
[facility]  Discharge Instructions: ?Thank you for choosing [facility] to provide your oncology and hematology care.  ? ?If you have a lab appointment with the Cancer Center, please go directly to the Cancer Center and check in at the registration area. ? ?Wear comfortable clothing and clothing appropriate for easy access to any Portacath or PICC line.  ? ?We strive to give you quality time with your provider. You may need to reschedule your appointment if you arrive late (15 or more minutes).  Arriving late affects you and other patients whose appointments are after yours.  Also, if you miss three or more appointments without notifying the office, you may be dismissed from the clinic at the provider?s discretion.    ?  ?For prescription refill requests, have your pharmacy contact our office and allow 72 hours for refills to be completed.   ? ?Today you received the following chemotherapy and/or immunotherapy agents Rituxan  ?  ?To help prevent nausea and vomiting after your treatment, we encourage you to take your nausea medication as directed. ? ?BELOW ARE SYMPTOMS THAT SHOULD BE REPORTED IMMEDIATELY: ?*FEVER GREATER THAN 100.4 F (38 ?C) OR HIGHER ?*CHILLS OR SWEATING ?*NAUSEA AND VOMITING THAT IS NOT CONTROLLED WITH YOUR NAUSEA MEDICATION ?*UNUSUAL SHORTNESS OF BREATH ?*UNUSUAL BRUISING OR BLEEDING ?*URINARY PROBLEMS (pain or burning when urinating, or frequent urination) ?*BOWEL PROBLEMS (unusual diarrhea, constipation, pain near the anus) ?TENDERNESS IN MOUTH AND THROAT WITH OR WITHOUT PRESENCE OF ULCERS (sore throat, sores in mouth, or a toothache) ?UNUSUAL RASH, SWELLING OR PAIN  ?UNUSUAL VAGINAL DISCHARGE OR ITCHING  ? ?Items with * indicate a potential emergency and should be followed up as soon as possible or go to the Emergency Department if any problems should occur. ? ?Please show the CHEMOTHERAPY ALERT CARD or IMMUNOTHERAPY ALERT CARD at check-in to the  Emergency Department and triage nurse. ?Should you have questions after your visit or need to cancel or reschedule your appointment, please contact [facility]  [phone] and follow the prompts.  Office hours are 8:00 a.m. to 4:30 p.m. Monday - Friday. Please note that voicemails left after 4:00 p.m. may not be returned until the following business day.  We are closed weekends and major holidays. You have access to a nurse at all times for urgent questions. Please call the main number to the clinic [phone] and follow the prompts. ? ?For any non-urgent questions, you may also contact your provider using MyChart. We now offer e-Visits for anyone 18 and older to request care online for non-urgent symptoms. For details visit [email or web address]. ?  ?Also download the MyChart app! Go to the app store, search "MyChart", open the app, select [facility], and log in with your MyChart username and password. ? ?Due to Covid, a mask is required upon entering the hospital/clinic. If you do not have a mask, one will be given to you upon arrival. For doctor visits, patients may have 1 support person aged 18 or older with them. For treatment visits, patients cannot have anyone with them due to current Covid guidelines and our immunocompromised population.  ?

## 2022-04-05 NOTE — Progress Notes (Signed)
Hematology and Oncology Follow Up Visit  Valerie Cline [id number] April 08, 1956 66 y.o. 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  She had back surgery back in February.  This sounds like it was quite extensive.  However, she was just in overnight.  She had fixation at L1-L2 and L2-L3.  She had cages placed.  She had posterior fixation of L1-L3.  There were screws placed.  She actually is quite good.  I am very impressed with the skills of Dr. Ronnald Ramp.  She has had no issues since then.  She is not doing any physical therapy.  She has had no problems with cough or shortness of breath.  She has had a good appetite.  She has had no headache.  She had no change in bowel or bladder habits.  Overall, I would say performance status for now is ECOG 1.    Medications:  Allergies as of 04/05/2022       Reactions   Canagliflozin Other (See Comments)   recurrent vaginitis   Glucophage [metformin] Other (See Comments)   GI intolerance   Niaspan [niacin] Other (See Comments)   flushing        Medication List        Accurate as of April 05, 2022  9:12 AM. If you have any questions, ask your nurse or doctor.          STOP taking these medications    amoxicillin 500 MG capsule Commonly known as: AMOXIL Stopped by: Volanda Napoleon, MD   celecoxib 200 MG capsule Commonly known as: CELEBREX Stopped by: Volanda Napoleon, MD   HYDROcodone-acetaminophen 5-325 MG tablet Commonly known as: NORCO/VICODIN Stopped by: Volanda Napoleon, MD   methocarbamol 500 MG tablet Commonly known as: ROBAXIN Stopped by: Volanda Napoleon, MD   Potassium 99 MG Tabs Stopped by: Volanda Napoleon, MD       TAKE these medications     Accu-Chek Guide test strip Generic drug: glucose blood 3 (three) times daily. What changed: Another medication with the same name was removed. Continue taking this medication, and follow the directions you see here. Changed by: Volanda Napoleon, MD   amLODipine 2.5 MG tablet Commonly known as: NORVASC TAKE 1 TABLET BY MOUTH EVERY DAY   aspirin EC 81 MG tablet Take 81 mg by mouth daily. Swallow whole.   BD Insulin Syringe U-500 31G X 6MM 0.5 ML Misc Generic drug: Insulin Syringe/Needle U-500 3 (three) times daily. as directed   benazepril 20 MG tablet Commonly known as: LOTENSIN Take 20 mg by mouth at bedtime.   Dexcom G7 Sensor Misc CHANGE SENSOR EVERY 10 DAYS   Dulaglutide 3 MG/0.5ML Sopn Inject 3 mg into the skin once a week.   famciclovir 500 MG tablet Commonly known as: FAMVIR Take 1 tablet (500 mg total) by mouth daily.   hydrochlorothiazide 25 MG tablet Commonly known as: HYDRODIURIL Take 25 mg by mouth at bedtime.   insulin regular human CONCENTRATED 500 UNIT/ML injection Commonly known as: HUMULIN R Inject 50-90 Units into the skin See admin instructions. Inject 80 units subcutaneously before breakfast, and 50 units before lunch and 90 for supper   multivitamin with  minerals tablet Take 1 tablet by mouth daily.   potassium chloride SA 20 MEQ tablet Commonly known as: KLOR-CON M Take 20 mEq by mouth 2 (two) times daily.   rosuvastatin 20 MG tablet Commonly known as: CRESTOR Take 1 tablet (20 mg total) by mouth daily.   Vitamin D3 25 MCG tablet Commonly known as: Vitamin D Take 1,000 Units by mouth daily.        Allergies:  Allergies  Allergen Reactions   Canagliflozin Other (See Comments)    recurrent vaginitis   Glucophage [Metformin] Other (See Comments)    GI intolerance   Niaspan [Niacin] Other (See Comments)    flushing    Past Medical History, Surgical history, Social history, and Family History were reviewed and updated.  Review  of Systems: Review of Systems  Constitutional: Negative.   HENT: Negative.    Eyes: Negative.   Respiratory: Negative.    Cardiovascular: Negative.   Gastrointestinal: Negative.   Genitourinary: Negative.   Musculoskeletal: Negative.   Skin: Negative.   Neurological: Negative.   Endo/Heme/Allergies: Negative.   Psychiatric/Behavioral: Negative.      Physical Exam:  height is '5\' 6"'$  (1.676 m) and weight is 179 lb 6.4 oz (81.4 kg). Her oral temperature is 97.8 F (36.6 C). Her blood pressure is 167/64 (abnormal) and her pulse is 72. Her respiration is 20 and oxygen saturation is 99%.   Wt Readings from Last 3 Encounters:  04/05/22 179 lb 6.4 oz (81.4 kg)  01/13/22 178 lb 12.8 oz (81.1 kg)  01/03/22 175 lb (79.4 kg)    Physical Exam Vitals reviewed.  HENT:     Head: Normocephalic and atraumatic.  Eyes:     Pupils: Pupils are equal, round, and reactive to light.  Cardiovascular:     Rate and Rhythm: Normal rate and regular rhythm.     Heart sounds: Normal heart sounds.  Pulmonary:     Effort: Pulmonary effort is normal.     Breath sounds: Normal breath sounds.  Abdominal:     General: Bowel sounds are normal.     Palpations: Abdomen is soft.  Musculoskeletal:        General: No tenderness or deformity. Normal range of motion.     Cervical back: Normal range of motion.     Comments: Her back exam shows a laminectomy scar in the lumbar spine.  This is well-healed.  Lymphadenopathy:     Cervical: No cervical adenopathy.  Skin:    General: Skin is warm and dry.     Findings: No erythema or rash.  Neurological:     Mental Status: She is alert and oriented to person, place, and time.  Psychiatric:        Behavior: Behavior normal.        Thought Content: Thought content normal.        Judgment: Judgment normal.      Lab Results  Component Value Date   WBC 7.3 04/05/2022   HGB 12.9 04/05/2022   HCT 40.7 04/05/2022   MCV 76.9 (L) 04/05/2022   PLT 222 04/05/2022    Lab Results  Component Value Date   FERRITIN 305 11/11/2019   Lab Results  Component Value Date   RBC 5.29 (H) 04/05/2022   Lab Results  Component Value Date   KPAFRELGTCHN 131.9 (H) 12/30/2019   LAMBDASER 29.8 (H) 12/30/2019   KAPLAMBRATIO 4.43 (H) 12/30/2019   Lab Results  Component Value Date   IGGSERUM 477 (L) 01/03/2022  IGA 88 01/03/2022   IGMSERUM 109 01/03/2022   Lab Results  Component Value Date   TOTALPROTELP 6.3 01/03/2022   ALBUMINELP 3.4 01/03/2022   A1GS 0.3 01/03/2022   A2GS 1.1 (H) 01/03/2022   BETS 0.9 01/03/2022   GAMS 0.6 01/03/2022   MSPIKE Not Observed 01/03/2022   SPEI Comment 01/03/2022     Chemistry      Component Value Date/Time   NA 139 01/03/2022 0946   NA 141 11/30/2021 1643   K 2.9 (L) 01/03/2022 0946   CL 95 (L) 01/03/2022 0946   CO2 34 (H) 01/03/2022 0946   BUN 15 01/03/2022 0946   BUN 15 11/30/2021 1643   CREATININE 0.67 01/03/2022 0946      Component Value Date/Time   CALCIUM 9.4 01/03/2022 0946   ALKPHOS 106 01/03/2022 0946   AST 24 01/03/2022 0946   ALT 24 01/03/2022 0946   BILITOT 0.7 01/03/2022 0946       Impression and Plan: Ms. Flicker is a very pleasant 66 yo caucasian female with small lymphocytic B cell non-Hodgkin's lymphoma. She completed 6 cycles of Rituxan/Bendamustine in September 2021 and is now on maintenance Rituxan therapy.   I am sad that she had to have the back surgery.  However, the seem to help her out quite a bit.  I am not surprised because Dr. Ronnald Ramp is such a skilled neurosurgeon.  She will finish up all of her Rituxan in December.  She will get Rituxan today.  She will then come back in 3 months for her next dose and then finally in December.  Volanda Napoleon, MD 6/6/20239:12 AM

## 2022-04-06 LAB — IGG, IGA, IGM
IgA: 94 mg/dL (ref 87–352)
IgG (Immunoglobin G), Serum: 537 mg/dL — ABNORMAL LOW (ref 586–1602)
IgM (Immunoglobulin M), Srm: 129 mg/dL (ref 26–217)

## 2022-04-07 LAB — PROTEIN ELECTROPHORESIS, SERUM
A/G Ratio: 1.5 (ref 0.7–1.7)
Albumin ELP: 3.8 g/dL (ref 2.9–4.4)
Alpha-1-Globulin: 0.2 g/dL (ref 0.0–0.4)
Alpha-2-Globulin: 0.9 g/dL (ref 0.4–1.0)
Beta Globulin: 0.9 g/dL (ref 0.7–1.3)
Gamma Globulin: 0.5 g/dL (ref 0.4–1.8)
Globulin, Total: 2.5 g/dL (ref 2.2–3.9)
Total Protein ELP: 6.3 g/dL (ref 6.0–8.5)

## 2022-04-18 ENCOUNTER — Encounter: Payer: Self-pay | Admitting: Cardiology

## 2022-04-20 ENCOUNTER — Other Ambulatory Visit: Payer: Self-pay | Admitting: *Deleted

## 2022-04-20 ENCOUNTER — Other Ambulatory Visit: Payer: Medicare Other

## 2022-04-20 DIAGNOSIS — E782 Mixed hyperlipidemia: Secondary | ICD-10-CM

## 2022-04-20 DIAGNOSIS — Z79899 Other long term (current) drug therapy: Secondary | ICD-10-CM

## 2022-04-20 LAB — LIPID PANEL
Chol/HDL Ratio: 4 ratio (ref 0.0–4.4)
Cholesterol, Total: 144 mg/dL (ref 100–199)
HDL: 36 mg/dL — ABNORMAL LOW (ref >39)
LDL Chol Calc (NIH): 72 mg/dL (ref 0–99)
Triglycerides: 214 mg/dL — ABNORMAL HIGH (ref 0–149)
VLDL Cholesterol Cal: 36 mg/dL (ref 5–40)

## 2022-04-20 LAB — ALT: ALT: 29 [IU]/L (ref 0–32)

## 2022-05-17 ENCOUNTER — Ambulatory Visit: Payer: Medicare Other | Admitting: Cardiology

## 2022-05-19 ENCOUNTER — Encounter: Payer: Self-pay | Admitting: Internal Medicine

## 2022-05-20 ENCOUNTER — Emergency Department (HOSPITAL_COMMUNITY)
Admission: EM | Admit: 2022-05-20 | Discharge: 2022-05-20 | Discharge status: Against Medical Advice | Payer: Medicare Other | Source: Home / Self Care

## 2022-05-20 ENCOUNTER — Encounter (HOSPITAL_COMMUNITY): Payer: Self-pay | Admitting: Emergency Medicine

## 2022-05-20 ENCOUNTER — Emergency Department (HOSPITAL_COMMUNITY): Payer: Medicare Other

## 2022-05-20 ENCOUNTER — Other Ambulatory Visit: Payer: Self-pay

## 2022-05-20 DIAGNOSIS — K7460 Unspecified cirrhosis of liver: Secondary | ICD-10-CM | POA: Diagnosis present

## 2022-05-20 DIAGNOSIS — B084 Enteroviral vesicular stomatitis with exanthem: Secondary | ICD-10-CM | POA: Insufficient documentation

## 2022-05-20 DIAGNOSIS — E785 Hyperlipidemia, unspecified: Secondary | ICD-10-CM | POA: Diagnosis present

## 2022-05-20 DIAGNOSIS — Z7982 Long term (current) use of aspirin: Secondary | ICD-10-CM

## 2022-05-20 DIAGNOSIS — L89152 Pressure ulcer of sacral region, stage 2: Secondary | ICD-10-CM | POA: Diagnosis not present

## 2022-05-20 DIAGNOSIS — I639 Cerebral infarction, unspecified: Secondary | ICD-10-CM | POA: Diagnosis not present

## 2022-05-20 DIAGNOSIS — Z6829 Body mass index (BMI) 29.0-29.9, adult: Secondary | ICD-10-CM

## 2022-05-20 DIAGNOSIS — E1142 Type 2 diabetes mellitus with diabetic polyneuropathy: Secondary | ICD-10-CM | POA: Diagnosis present

## 2022-05-20 DIAGNOSIS — R6520 Severe sepsis without septic shock: Secondary | ICD-10-CM | POA: Diagnosis present

## 2022-05-20 DIAGNOSIS — Z888 Allergy status to other drugs, medicaments and biological substances status: Secondary | ICD-10-CM

## 2022-05-20 DIAGNOSIS — N390 Urinary tract infection, site not specified: Secondary | ICD-10-CM | POA: Diagnosis present

## 2022-05-20 DIAGNOSIS — R1310 Dysphagia, unspecified: Secondary | ICD-10-CM | POA: Diagnosis not present

## 2022-05-20 DIAGNOSIS — R451 Restlessness and agitation: Secondary | ICD-10-CM | POA: Diagnosis not present

## 2022-05-20 DIAGNOSIS — J9501 Hemorrhage from tracheostomy stoma: Secondary | ICD-10-CM | POA: Diagnosis not present

## 2022-05-20 DIAGNOSIS — J9601 Acute respiratory failure with hypoxia: Secondary | ICD-10-CM | POA: Diagnosis present

## 2022-05-20 DIAGNOSIS — J189 Pneumonia, unspecified organism: Secondary | ICD-10-CM | POA: Diagnosis present

## 2022-05-20 DIAGNOSIS — D801 Nonfamilial hypogammaglobulinemia: Secondary | ICD-10-CM | POA: Diagnosis not present

## 2022-05-20 DIAGNOSIS — T501X5A Adverse effect of loop [high-ceiling] diuretics, initial encounter: Secondary | ICD-10-CM | POA: Diagnosis present

## 2022-05-20 DIAGNOSIS — R509 Fever, unspecified: Secondary | ICD-10-CM | POA: Insufficient documentation

## 2022-05-20 DIAGNOSIS — J9602 Acute respiratory failure with hypercapnia: Secondary | ICD-10-CM | POA: Diagnosis not present

## 2022-05-20 DIAGNOSIS — Z9911 Dependence on respirator [ventilator] status: Secondary | ICD-10-CM

## 2022-05-20 DIAGNOSIS — K7200 Acute and subacute hepatic failure without coma: Secondary | ICD-10-CM | POA: Diagnosis not present

## 2022-05-20 DIAGNOSIS — E232 Diabetes insipidus: Secondary | ICD-10-CM | POA: Diagnosis not present

## 2022-05-20 DIAGNOSIS — E782 Mixed hyperlipidemia: Secondary | ICD-10-CM | POA: Diagnosis present

## 2022-05-20 DIAGNOSIS — K760 Fatty (change of) liver, not elsewhere classified: Secondary | ICD-10-CM | POA: Diagnosis present

## 2022-05-20 DIAGNOSIS — I119 Hypertensive heart disease without heart failure: Secondary | ICD-10-CM | POA: Diagnosis present

## 2022-05-20 DIAGNOSIS — Z79899 Other long term (current) drug therapy: Secondary | ICD-10-CM

## 2022-05-20 DIAGNOSIS — I2510 Atherosclerotic heart disease of native coronary artery without angina pectoris: Secondary | ICD-10-CM | POA: Diagnosis present

## 2022-05-20 DIAGNOSIS — E781 Pure hyperglyceridemia: Secondary | ICD-10-CM | POA: Diagnosis present

## 2022-05-20 DIAGNOSIS — I2692 Saddle embolus of pulmonary artery without acute cor pulmonale: Secondary | ICD-10-CM | POA: Diagnosis not present

## 2022-05-20 DIAGNOSIS — Z886 Allergy status to analgesic agent status: Secondary | ICD-10-CM

## 2022-05-20 DIAGNOSIS — Z5321 Procedure and treatment not carried out due to patient leaving prior to being seen by health care provider: Secondary | ICD-10-CM | POA: Insufficient documentation

## 2022-05-20 DIAGNOSIS — R001 Bradycardia, unspecified: Secondary | ICD-10-CM | POA: Diagnosis not present

## 2022-05-20 DIAGNOSIS — I6523 Occlusion and stenosis of bilateral carotid arteries: Secondary | ICD-10-CM | POA: Diagnosis present

## 2022-05-20 DIAGNOSIS — G911 Obstructive hydrocephalus: Secondary | ICD-10-CM | POA: Diagnosis not present

## 2022-05-20 DIAGNOSIS — E669 Obesity, unspecified: Secondary | ICD-10-CM | POA: Diagnosis present

## 2022-05-20 DIAGNOSIS — Z515 Encounter for palliative care: Secondary | ICD-10-CM

## 2022-05-20 DIAGNOSIS — D320 Benign neoplasm of cerebral meninges: Secondary | ICD-10-CM | POA: Diagnosis present

## 2022-05-20 DIAGNOSIS — Z794 Long term (current) use of insulin: Secondary | ICD-10-CM

## 2022-05-20 DIAGNOSIS — A870 Enteroviral meningitis: Secondary | ICD-10-CM | POA: Diagnosis present

## 2022-05-20 DIAGNOSIS — G9349 Other encephalopathy: Secondary | ICD-10-CM | POA: Diagnosis not present

## 2022-05-20 DIAGNOSIS — G9341 Metabolic encephalopathy: Secondary | ICD-10-CM | POA: Diagnosis present

## 2022-05-20 DIAGNOSIS — G40901 Epilepsy, unspecified, not intractable, with status epilepticus: Secondary | ICD-10-CM | POA: Diagnosis not present

## 2022-05-20 DIAGNOSIS — A4189 Other specified sepsis: Secondary | ICD-10-CM | POA: Diagnosis present

## 2022-05-20 DIAGNOSIS — T451X5A Adverse effect of antineoplastic and immunosuppressive drugs, initial encounter: Secondary | ICD-10-CM | POA: Diagnosis not present

## 2022-05-20 DIAGNOSIS — D849 Immunodeficiency, unspecified: Secondary | ICD-10-CM | POA: Diagnosis present

## 2022-05-20 DIAGNOSIS — Z789 Other specified health status: Secondary | ICD-10-CM

## 2022-05-20 DIAGNOSIS — N179 Acute kidney failure, unspecified: Secondary | ICD-10-CM | POA: Diagnosis present

## 2022-05-20 DIAGNOSIS — R531 Weakness: Secondary | ICD-10-CM | POA: Insufficient documentation

## 2022-05-20 DIAGNOSIS — A850 Enteroviral encephalitis: Secondary | ICD-10-CM | POA: Diagnosis present

## 2022-05-20 DIAGNOSIS — R34 Anuria and oliguria: Secondary | ICD-10-CM | POA: Diagnosis not present

## 2022-05-20 DIAGNOSIS — E876 Hypokalemia: Secondary | ICD-10-CM | POA: Diagnosis present

## 2022-05-20 DIAGNOSIS — A419 Sepsis, unspecified organism: Secondary | ICD-10-CM | POA: Diagnosis present

## 2022-05-20 DIAGNOSIS — E11649 Type 2 diabetes mellitus with hypoglycemia without coma: Secondary | ICD-10-CM | POA: Diagnosis not present

## 2022-05-20 DIAGNOSIS — E875 Hyperkalemia: Secondary | ICD-10-CM | POA: Diagnosis present

## 2022-05-20 DIAGNOSIS — Z881 Allergy status to other antibiotic agents status: Secondary | ICD-10-CM

## 2022-05-20 DIAGNOSIS — E1165 Type 2 diabetes mellitus with hyperglycemia: Secondary | ICD-10-CM | POA: Diagnosis present

## 2022-05-20 DIAGNOSIS — Z66 Do not resuscitate: Secondary | ICD-10-CM | POA: Diagnosis not present

## 2022-05-20 DIAGNOSIS — Z87898 Personal history of other specified conditions: Secondary | ICD-10-CM

## 2022-05-20 DIAGNOSIS — R403 Persistent vegetative state: Secondary | ICD-10-CM | POA: Diagnosis not present

## 2022-05-20 DIAGNOSIS — Z981 Arthrodesis status: Secondary | ICD-10-CM

## 2022-05-20 DIAGNOSIS — R339 Retention of urine, unspecified: Secondary | ICD-10-CM | POA: Diagnosis not present

## 2022-05-20 DIAGNOSIS — R579 Shock, unspecified: Secondary | ICD-10-CM | POA: Diagnosis not present

## 2022-05-20 DIAGNOSIS — G9520 Unspecified cord compression: Secondary | ICD-10-CM | POA: Diagnosis present

## 2022-05-20 DIAGNOSIS — C8358 Lymphoblastic (diffuse) lymphoma, lymph nodes of multiple sites: Secondary | ICD-10-CM | POA: Diagnosis present

## 2022-05-20 DIAGNOSIS — Z9221 Personal history of antineoplastic chemotherapy: Secondary | ICD-10-CM

## 2022-05-20 DIAGNOSIS — E872 Acidosis, unspecified: Secondary | ICD-10-CM | POA: Diagnosis present

## 2022-05-20 DIAGNOSIS — G8191 Hemiplegia, unspecified affecting right dominant side: Secondary | ICD-10-CM | POA: Diagnosis not present

## 2022-05-20 LAB — COMPREHENSIVE METABOLIC PANEL
ALT: 47 U/L — ABNORMAL HIGH (ref 0–44)
AST: 57 U/L — ABNORMAL HIGH (ref 15–41)
Albumin: 3.3 g/dL — ABNORMAL LOW (ref 3.5–5.0)
Alkaline Phosphatase: 70 U/L (ref 38–126)
Anion gap: 12 (ref 5–15)
BUN: 7 mg/dL — ABNORMAL LOW (ref 8–23)
CO2: 27 mmol/L (ref 22–32)
Calcium: 8.6 mg/dL — ABNORMAL LOW (ref 8.9–10.3)
Chloride: 97 mmol/L — ABNORMAL LOW (ref 98–111)
Creatinine, Ser: 0.84 mg/dL (ref 0.44–1.00)
GFR, Estimated: >60 mL/min (ref >60)
Glucose, Bld: 213 mg/dL — ABNORMAL HIGH (ref 70–99)
Potassium: 2.9 mmol/L — ABNORMAL LOW (ref 3.5–5.1)
Sodium: 136 mmol/L (ref 135–145)
Total Bilirubin: 0.9 mg/dL (ref 0.3–1.2)
Total Protein: 6.7 g/dL (ref 6.5–8.1)

## 2022-05-20 LAB — CBC WITH DIFFERENTIAL/PLATELET
Abs Immature Granulocytes: 0.03 10*3/uL (ref 0.00–0.07)
Basophils Absolute: 0 10*3/uL (ref 0.0–0.1)
Basophils Relative: 0 %
Eosinophils Absolute: 0 10*3/uL (ref 0.0–0.5)
Eosinophils Relative: 1 %
HCT: 37.1 % (ref 36.0–46.0)
Hemoglobin: 11.8 g/dL — ABNORMAL LOW (ref 12.0–15.0)
Immature Granulocytes: 1 %
Lymphocytes Relative: 9 %
Lymphs Abs: 0.5 10*3/uL — ABNORMAL LOW (ref 0.7–4.0)
MCH: 24.4 pg — ABNORMAL LOW (ref 26.0–34.0)
MCHC: 31.8 g/dL (ref 30.0–36.0)
MCV: 76.8 fL — ABNORMAL LOW (ref 80.0–100.0)
Monocytes Absolute: 0.7 10*3/uL (ref 0.1–1.0)
Monocytes Relative: 11 %
Neutro Abs: 4.9 10*3/uL (ref 1.7–7.7)
Neutrophils Relative %: 78 %
Platelets: 226 10*3/uL (ref 150–400)
RBC: 4.83 MIL/uL (ref 3.87–5.11)
RDW: 15.5 % (ref 11.5–15.5)
WBC: 6.2 10*3/uL (ref 4.0–10.5)
nRBC: 0 % (ref 0.0–0.2)

## 2022-05-20 LAB — ACETAMINOPHEN LEVEL: Acetaminophen (Tylenol), Serum: <10 ug/mL — ABNORMAL LOW (ref 10–30)

## 2022-05-20 MED ORDER — ACETAMINOPHEN 500 MG PO TABS: 1000.0000 mg | ORAL_TABLET | Freq: Once | ORAL | Status: DC

## 2022-05-20 NOTE — ED Provider Triage Note (Signed)
Emergency Medicine Provider Triage Evaluation Note  Valerie Cline , a 66 y.o. female  was evaluated in triage.  Pt complains of left upper extremity weakness.  She reports having hand-foot-and-mouth over the last week and has been taking Tylenol for pain and fevers successfully.  Over the past 2 to 3 days she has noted some weakness and tremor to the left upper extremity.  No history of CVA.  Does not drink alcohol.  No known thyroid disorders.  Review of Systems  Positive: Extremity weakness, fevers Negative: Slurred speech, blurred vision  Physical Exam  BP (!) 168/66 (BP Location: Right Arm)   Pulse (!) 103   Temp (!) 101 F (38.3 C) (Oral)   Resp 18   SpO2 97%  Gen:   Awake, no distress   Resp:  Normal effort  MSK:   Moves extremities without difficulty  Other:  Dysmetria on finger-nose of the left side.  Resting tremor that does not improve with intentional movement.  5 out of 5 strength in the right upper extremity.  4-5 strength to finger grip on the left side and elbow flexion.  PERRLA, EOMs intact.  Medical Decision Making  Medically screening exam initiated at 9:08 AM.  Appropriate orders placed.  DEYSI SOLDO was informed that the remainder of the evaluation will be completed by another provider, this initial triage assessment does not replace that evaluation, and the importance of remaining in the ED until their evaluation is complete.    We will obtain brain imaging and basic labs.  Outside of stroke window.  Given Tylenol for her fever.   Rhae Hammock, PA-C 05/20/22 [phone]

## 2022-05-20 NOTE — ED Triage Notes (Addendum)
Patient here with complaint of left arm weakness that started on Wednesday, was treated for hand foot mouth last week. Patient is alert, oriented, ambulatory, and in no apparent distress at this time.  No dysarthria, no facial droop, no arm drift, left arm trembles while extended but does not fall.

## 2022-05-20 NOTE — ED Notes (Signed)
Pt could not wait any longer. Was in pain

## 2022-05-23 ENCOUNTER — Other Ambulatory Visit: Payer: Self-pay

## 2022-05-23 ENCOUNTER — Other Ambulatory Visit (HOSPITAL_COMMUNITY)
Admission: RE | Admit: 2022-05-23 | Discharge: 2022-05-23 | Disposition: A | Discharge status: Home / Self Care | Payer: Medicare Other | Source: Ambulatory Visit | Attending: Internal Medicine | Admitting: Internal Medicine

## 2022-05-23 ENCOUNTER — Inpatient Hospital Stay (HOSPITAL_COMMUNITY)
Admission: AD | Admit: 2022-05-23 | Discharge: 2022-07-01 | DRG: 004 | Disposition: E | Payer: Medicare Other | Source: Other Acute Inpatient Hospital | Attending: Internal Medicine | Admitting: Internal Medicine

## 2022-05-23 DIAGNOSIS — G9341 Metabolic encephalopathy: Secondary | ICD-10-CM

## 2022-05-23 DIAGNOSIS — B084 Enteroviral vesicular stomatitis with exanthem: Secondary | ICD-10-CM | POA: Diagnosis present

## 2022-05-23 DIAGNOSIS — D320 Benign neoplasm of cerebral meninges: Secondary | ICD-10-CM | POA: Diagnosis present

## 2022-05-23 DIAGNOSIS — R569 Unspecified convulsions: Secondary | ICD-10-CM | POA: Diagnosis not present

## 2022-05-23 DIAGNOSIS — Z886 Allergy status to analgesic agent status: Secondary | ICD-10-CM

## 2022-05-23 DIAGNOSIS — E1142 Type 2 diabetes mellitus with diabetic polyneuropathy: Secondary | ICD-10-CM | POA: Diagnosis present

## 2022-05-23 DIAGNOSIS — R9431 Abnormal electrocardiogram [ECG] [EKG]: Secondary | ICD-10-CM | POA: Diagnosis not present

## 2022-05-23 DIAGNOSIS — E872 Acidosis, unspecified: Secondary | ICD-10-CM | POA: Diagnosis present

## 2022-05-23 DIAGNOSIS — R403 Persistent vegetative state: Secondary | ICD-10-CM | POA: Diagnosis not present

## 2022-05-23 DIAGNOSIS — E782 Mixed hyperlipidemia: Secondary | ICD-10-CM | POA: Diagnosis present

## 2022-05-23 DIAGNOSIS — L89152 Pressure ulcer of sacral region, stage 2: Secondary | ICD-10-CM | POA: Diagnosis not present

## 2022-05-23 DIAGNOSIS — G9520 Unspecified cord compression: Secondary | ICD-10-CM | POA: Diagnosis present

## 2022-05-23 DIAGNOSIS — A870 Enteroviral meningitis: Secondary | ICD-10-CM | POA: Diagnosis present

## 2022-05-23 DIAGNOSIS — C8300 Small cell B-cell lymphoma, unspecified site: Secondary | ICD-10-CM | POA: Diagnosis not present

## 2022-05-23 DIAGNOSIS — J9602 Acute respiratory failure with hypercapnia: Secondary | ICD-10-CM | POA: Diagnosis not present

## 2022-05-23 DIAGNOSIS — E8721 Acute metabolic acidosis: Secondary | ICD-10-CM | POA: Diagnosis not present

## 2022-05-23 DIAGNOSIS — G4089 Other seizures: Secondary | ICD-10-CM | POA: Diagnosis not present

## 2022-05-23 DIAGNOSIS — I119 Hypertensive heart disease without heart failure: Secondary | ICD-10-CM | POA: Diagnosis present

## 2022-05-23 DIAGNOSIS — Z79899 Other long term (current) drug therapy: Secondary | ICD-10-CM

## 2022-05-23 DIAGNOSIS — Z6829 Body mass index (BMI) 29.0-29.9, adult: Secondary | ICD-10-CM

## 2022-05-23 DIAGNOSIS — Z9911 Dependence on respirator [ventilator] status: Secondary | ICD-10-CM

## 2022-05-23 DIAGNOSIS — K7200 Acute and subacute hepatic failure without coma: Secondary | ICD-10-CM | POA: Diagnosis not present

## 2022-05-23 DIAGNOSIS — K7460 Unspecified cirrhosis of liver: Secondary | ICD-10-CM | POA: Diagnosis present

## 2022-05-23 DIAGNOSIS — Z87898 Personal history of other specified conditions: Secondary | ICD-10-CM

## 2022-05-23 DIAGNOSIS — Z7982 Long term (current) use of aspirin: Secondary | ICD-10-CM

## 2022-05-23 DIAGNOSIS — A850 Enteroviral encephalitis: Secondary | ICD-10-CM | POA: Diagnosis present

## 2022-05-23 DIAGNOSIS — R609 Edema, unspecified: Secondary | ICD-10-CM | POA: Diagnosis not present

## 2022-05-23 DIAGNOSIS — R6520 Severe sepsis without septic shock: Secondary | ICD-10-CM | POA: Diagnosis present

## 2022-05-23 DIAGNOSIS — D329 Benign neoplasm of meninges, unspecified: Secondary | ICD-10-CM

## 2022-05-23 DIAGNOSIS — J189 Pneumonia, unspecified organism: Secondary | ICD-10-CM | POA: Diagnosis present

## 2022-05-23 DIAGNOSIS — G8191 Hemiplegia, unspecified affecting right dominant side: Secondary | ICD-10-CM | POA: Diagnosis not present

## 2022-05-23 DIAGNOSIS — J9601 Acute respiratory failure with hypoxia: Secondary | ICD-10-CM | POA: Diagnosis present

## 2022-05-23 DIAGNOSIS — B341 Enterovirus infection, unspecified: Secondary | ICD-10-CM | POA: Diagnosis not present

## 2022-05-23 DIAGNOSIS — E781 Pure hyperglyceridemia: Secondary | ICD-10-CM | POA: Diagnosis present

## 2022-05-23 DIAGNOSIS — K7290 Hepatic failure, unspecified without coma: Secondary | ICD-10-CM | POA: Diagnosis not present

## 2022-05-23 DIAGNOSIS — G039 Meningitis, unspecified: Secondary | ICD-10-CM | POA: Diagnosis not present

## 2022-05-23 DIAGNOSIS — D801 Nonfamilial hypogammaglobulinemia: Secondary | ICD-10-CM | POA: Diagnosis not present

## 2022-05-23 DIAGNOSIS — T501X5A Adverse effect of loop [high-ceiling] diuretics, initial encounter: Secondary | ICD-10-CM | POA: Diagnosis present

## 2022-05-23 DIAGNOSIS — G9349 Other encephalopathy: Secondary | ICD-10-CM | POA: Diagnosis not present

## 2022-05-23 DIAGNOSIS — E11649 Type 2 diabetes mellitus with hypoglycemia without coma: Secondary | ICD-10-CM | POA: Diagnosis not present

## 2022-05-23 DIAGNOSIS — L8990 Pressure ulcer of unspecified site, unspecified stage: Secondary | ICD-10-CM | POA: Insufficient documentation

## 2022-05-23 DIAGNOSIS — K760 Fatty (change of) liver, not elsewhere classified: Secondary | ICD-10-CM | POA: Diagnosis present

## 2022-05-23 DIAGNOSIS — Z888 Allergy status to other drugs, medicaments and biological substances status: Secondary | ICD-10-CM

## 2022-05-23 DIAGNOSIS — N390 Urinary tract infection, site not specified: Secondary | ICD-10-CM

## 2022-05-23 DIAGNOSIS — Z515 Encounter for palliative care: Secondary | ICD-10-CM | POA: Diagnosis not present

## 2022-05-23 DIAGNOSIS — I6523 Occlusion and stenosis of bilateral carotid arteries: Secondary | ICD-10-CM | POA: Diagnosis present

## 2022-05-23 DIAGNOSIS — Z789 Other specified health status: Secondary | ICD-10-CM

## 2022-05-23 DIAGNOSIS — N179 Acute kidney failure, unspecified: Secondary | ICD-10-CM | POA: Diagnosis not present

## 2022-05-23 DIAGNOSIS — R270 Ataxia, unspecified: Secondary | ICD-10-CM | POA: Diagnosis not present

## 2022-05-23 DIAGNOSIS — D849 Immunodeficiency, unspecified: Secondary | ICD-10-CM | POA: Diagnosis present

## 2022-05-23 DIAGNOSIS — E875 Hyperkalemia: Secondary | ICD-10-CM | POA: Diagnosis present

## 2022-05-23 DIAGNOSIS — R945 Abnormal results of liver function studies: Secondary | ICD-10-CM | POA: Diagnosis not present

## 2022-05-23 DIAGNOSIS — T451X5A Adverse effect of antineoplastic and immunosuppressive drugs, initial encounter: Secondary | ICD-10-CM | POA: Diagnosis not present

## 2022-05-23 DIAGNOSIS — E232 Diabetes insipidus: Secondary | ICD-10-CM | POA: Diagnosis not present

## 2022-05-23 DIAGNOSIS — Z9221 Personal history of antineoplastic chemotherapy: Secondary | ICD-10-CM

## 2022-05-23 DIAGNOSIS — I2692 Saddle embolus of pulmonary artery without acute cor pulmonale: Secondary | ICD-10-CM | POA: Diagnosis not present

## 2022-05-23 DIAGNOSIS — R4182 Altered mental status, unspecified: Secondary | ICD-10-CM | POA: Diagnosis not present

## 2022-05-23 DIAGNOSIS — R001 Bradycardia, unspecified: Secondary | ICD-10-CM | POA: Diagnosis not present

## 2022-05-23 DIAGNOSIS — R509 Fever, unspecified: Secondary | ICD-10-CM | POA: Diagnosis not present

## 2022-05-23 DIAGNOSIS — C8358 Lymphoblastic (diffuse) lymphoma, lymph nodes of multiple sites: Secondary | ICD-10-CM | POA: Diagnosis present

## 2022-05-23 DIAGNOSIS — I248 Other forms of acute ischemic heart disease: Secondary | ICD-10-CM | POA: Diagnosis not present

## 2022-05-23 DIAGNOSIS — Z992 Dependence on renal dialysis: Secondary | ICD-10-CM | POA: Diagnosis not present

## 2022-05-23 DIAGNOSIS — G40901 Epilepsy, unspecified, not intractable, with status epilepticus: Secondary | ICD-10-CM | POA: Diagnosis not present

## 2022-05-23 DIAGNOSIS — B9719 Other enterovirus as the cause of diseases classified elsewhere: Secondary | ICD-10-CM | POA: Diagnosis not present

## 2022-05-23 DIAGNOSIS — R339 Retention of urine, unspecified: Secondary | ICD-10-CM | POA: Diagnosis not present

## 2022-05-23 DIAGNOSIS — E785 Hyperlipidemia, unspecified: Secondary | ICD-10-CM | POA: Diagnosis present

## 2022-05-23 DIAGNOSIS — Z66 Do not resuscitate: Secondary | ICD-10-CM | POA: Diagnosis not present

## 2022-05-23 DIAGNOSIS — I639 Cerebral infarction, unspecified: Secondary | ICD-10-CM | POA: Diagnosis not present

## 2022-05-23 DIAGNOSIS — R451 Restlessness and agitation: Secondary | ICD-10-CM | POA: Diagnosis not present

## 2022-05-23 DIAGNOSIS — R34 Anuria and oliguria: Secondary | ICD-10-CM | POA: Diagnosis not present

## 2022-05-23 DIAGNOSIS — R739 Hyperglycemia, unspecified: Secondary | ICD-10-CM | POA: Diagnosis not present

## 2022-05-23 DIAGNOSIS — Z881 Allergy status to other antibiotic agents status: Secondary | ICD-10-CM

## 2022-05-23 DIAGNOSIS — A879 Viral meningitis, unspecified: Secondary | ICD-10-CM | POA: Diagnosis not present

## 2022-05-23 DIAGNOSIS — G959 Disease of spinal cord, unspecified: Secondary | ICD-10-CM | POA: Diagnosis not present

## 2022-05-23 DIAGNOSIS — C8580 Other specified types of non-Hodgkin lymphoma, unspecified site: Secondary | ICD-10-CM | POA: Diagnosis not present

## 2022-05-23 DIAGNOSIS — G911 Obstructive hydrocephalus: Secondary | ICD-10-CM | POA: Diagnosis not present

## 2022-05-23 DIAGNOSIS — E1165 Type 2 diabetes mellitus with hyperglycemia: Secondary | ICD-10-CM | POA: Diagnosis present

## 2022-05-23 DIAGNOSIS — E669 Obesity, unspecified: Secondary | ICD-10-CM | POA: Diagnosis present

## 2022-05-23 DIAGNOSIS — J181 Lobar pneumonia, unspecified organism: Secondary | ICD-10-CM | POA: Diagnosis not present

## 2022-05-23 DIAGNOSIS — Z794 Long term (current) use of insulin: Secondary | ICD-10-CM

## 2022-05-23 DIAGNOSIS — R1310 Dysphagia, unspecified: Secondary | ICD-10-CM | POA: Diagnosis not present

## 2022-05-23 DIAGNOSIS — I2510 Atherosclerotic heart disease of native coronary artery without angina pectoris: Secondary | ICD-10-CM | POA: Diagnosis present

## 2022-05-23 DIAGNOSIS — A419 Sepsis, unspecified organism: Secondary | ICD-10-CM | POA: Diagnosis present

## 2022-05-23 DIAGNOSIS — A4189 Other specified sepsis: Principal | ICD-10-CM | POA: Diagnosis present

## 2022-05-23 DIAGNOSIS — Z981 Arthrodesis status: Secondary | ICD-10-CM

## 2022-05-23 DIAGNOSIS — Q2112 Patent foramen ovale: Secondary | ICD-10-CM | POA: Diagnosis not present

## 2022-05-23 DIAGNOSIS — E876 Hypokalemia: Secondary | ICD-10-CM | POA: Diagnosis present

## 2022-05-23 DIAGNOSIS — Z7189 Other specified counseling: Secondary | ICD-10-CM | POA: Diagnosis not present

## 2022-05-23 DIAGNOSIS — R579 Shock, unspecified: Secondary | ICD-10-CM | POA: Diagnosis not present

## 2022-05-23 DIAGNOSIS — D72829 Elevated white blood cell count, unspecified: Secondary | ICD-10-CM | POA: Diagnosis not present

## 2022-05-23 DIAGNOSIS — J129 Viral pneumonia, unspecified: Secondary | ICD-10-CM | POA: Diagnosis not present

## 2022-05-23 DIAGNOSIS — J9501 Hemorrhage from tracheostomy stoma: Secondary | ICD-10-CM | POA: Diagnosis not present

## 2022-05-23 LAB — GLUCOSE, CAPILLARY: Glucose-Capillary: 235 mg/dL — ABNORMAL HIGH (ref 70–99)

## 2022-05-23 LAB — MRSA NEXT GEN BY PCR, NASAL: MRSA by PCR Next Gen: NOT DETECTED

## 2022-05-23 MED ORDER — INSULIN ASPART 100 UNIT/ML IJ SOLN
0.0000 [IU] | INTRAMUSCULAR | Status: DC
  Administered 2022-05-24: 7 [IU] via SUBCUTANEOUS
  Administered 2022-05-24: 3 [IU] via SUBCUTANEOUS

## 2022-05-23 NOTE — Consult Note (Signed)
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   Discharge summary reviewed: She initially presented to OSH on 05/20/22 for left upper extremity tremors. She reported recent hand foot any mouth disease contracted from her grandchild that left her weak and tired prior to this admission. Neurology work-up including MRI, EEG negative. MRI/MRA neg for acute infarct, 13 mm focus in left frontal lobe representing artifact vs small calcified meningioma, chronic small vessel ischemic changes, cerebral atrophy, high-grade stenosis in bilateral ICA, focal stenosis in left ACA and intracranial right vertebral artery. MRI of C-spine showing C4-5 cord compression from advanced disc degeneration with posterior endplate ridging.  Showing left foraminal impingement at the same level.  Also showing C5-6 and C6-7 ADCF with solid arthrodesis.  Neurologist at [facility] had recommended neurosurgical evaluation.  She developed fevers and transferred to ICU concerning for meningitis and started on Vanc, rocephin, ampicillin and acyclovir. LP was performed on 7/24 with WBC 55 RBC 2. Pending herpes, enterovirus, ehrlichia. CT A/P showed bilateral ground glass and lung opacities concerning for multifocal pneumonia.   PCCM consulted for decreased responsiveness and hypoxemia on NRB. On exam obtunded and only able to say name. O2 sats in the 70s on NRB. Transferred to the ICU for emergent intubation   Pertinent  Medical History  As above  Significant Hospital Events: Including procedures,  antibiotic start and stop dates in addition to other pertinent events   7/24 Admitted to [facility] from [place]. Transferred to PCCM when found in respiratory failure  Interim History / Subjective:  As above  Objective   Blood pressure (!) 128/97, pulse (!) 115, temperature 98.4 F (36.9 C), temperature source Axillary, resp. rate 20, weight 84.4 kg, SpO2 94 %.       No intake or output data in the 24 hours ending 05/06/2022 2351 Filed Weights   05/26/2022 2115  Weight: 84.4 kg   Physical Exam: General: Critically ill-appearing, diaphoretic, letharic HENT: [place], AT Eyes: Eyes closed Respiratory: Diminished breath sounds to auscultation bilaterally.  No crackles, wheezing or rales Cardiovascular: RRR, -M/R/G, no JVD Extremities:-Edema,-tenderness Neuro: Lethargic, does not opens eyes to voice, grunts to questions, weakly says name. Right upper extremity tremor, worsened with intention.   Resolved Hospital Problem list   N/A  Assessment & Plan:   Acute hypoxemic respiratory failure secondary to pneumonia Intubated for hypoxemia F/u post-intubation ABG, CXR Full vent support. Wean FIO2/PEEP SBT/WUA daily when eligible VAP PAD protocol for RASS goal -1: Fentanyl Antibiotic coverage for meningitis also covers CAP F/u urine antigen for strep pneumo/legionella  Acute encephalopathy secondary to Suspected bacterial meningitis in immunocompromised patient. Ddx includes viral meningitis, encephalitis. MRI with left frontal meningoma/artifact LP 7/24: 55 WBCs with 84% neutrophils, 2 RBCs, glucose 147, total protein 107 Glucose may have normalized after 96 hours of treatment and can be normal in patients with hx hyperglycemia Upper extremity tremors Cervical myelopathy Plan Continue antibiotics: Vanc, ceftriaxone, acyclovir, ampicillin F/u OSH LP cultures including viral studies Consider ID consult in am F/u blood cultures Tylenol once for fevers. Monitor LFTs. Cooling blanket for  fever Target  normothermia Neuro checks Neuro consulted at [facility] O[facility] Neuro recommended NSG evaluation for cervical myelopathy  UTI. Positive UA at OSH Antibiotics as above  HTN PRN hydralazine  DM2 SSI, CBG q4  Best Practice (right click and "Reselect all SmartList Selections" daily)   Diet/type: NPO DVT prophylaxis:  GI prophylaxis: PPI Lines: Central line Right femoral Foley:  Yes, and it is still needed Code Status:  full code Last date of multidisciplinary goals of care discussion '[]'$  Husband agreed to full code status and wanted intubation if indicated  Labs   CBC: Recent Labs  Lab 05/20/22 0918  WBC 6.2  NEUTROABS 4.9  HGB 11.8*  HCT 37.1  MCV 76.8*  PLT 710    Basic Metabolic Panel: Recent Labs  Lab 05/20/22 0918  NA 136  K 2.9*  CL 97*  CO2 27  GLUCOSE 213*  BUN 7*  CREATININE 0.84  CALCIUM 8.6*   GFR: Estimated Creatinine Clearance: 71.1 mL/min (by C-G formula based on SCr of 0.84 mg/dL). Recent Labs  Lab 05/20/22 0918  WBC 6.2    Liver Function Tests: Recent Labs  Lab 05/20/22 0918  AST 57*  ALT 47*  ALKPHOS 70  BILITOT 0.9  PROT 6.7  ALBUMIN 3.3*   No results for input(s): "LIPASE", "AMYLASE" in the last 168 hours. No results for input(s): "AMMONIA" in the last 168 hours.  ABG No results found for: "PHART", "PCO2ART", "PO2ART", "HCO3", "TCO2", "ACIDBASEDEF", "O2SAT"   Coagulation Profile: No results for input(s): "INR", "PROTIME" in the last 168 hours.  Cardiac Enzymes: No results for input(s): "CKTOTAL", "CKMB", "CKMBINDEX", "TROPONINI" in the last 168 hours.  HbA1C: Hgb A1c MFr Bld  Date/Time Value Ref Range Status  11/16/2021 09:34 AM 7.3 (H) 4.8 - 5.6 % Final    Comment:    (NOTE) Pre diabetes:          5.7%-6.4%  Diabetes:              >6.4%  Glycemic control for   <7.0% adults with diabetes   11/08/2019 08:28 PM 8.2 (H) 4.8 - 5.6 % Final    Comment:    (NOTE) Pre diabetes:          5.7%-6.4% Diabetes:               >6.4% Glycemic control for   <7.0% adults with diabetes     CBG: Recent Labs  Lab 05/15/2022 2327  GLUCAP 235*    Review of Systems:   Unable to obtain  Past Medical History:  She,  has a past medical history of Allergy, Atherosclerotic heart disease of native coronary artery without angina pectoris (01/13/2020), Blood in stool, Carotid artery occlusion (08/13/2009), Carotid stenosis, Cervical lymphadenopathy (10/31/2019), CLL (chronic lymphocytic leukemia) ([place]) (62/69/4854), Complication of anesthesia, Coronary artery calcification seen on CT scan (06/01/2020), Diabetes mellitus without complication ([place]), Diabetic peripheral neuropathy associated with type 2 diabetes mellitus ([place]) (06/08/2009), Elevated liver function tests, Essential hypertension (11/08/2019), Fatty liver (09/06/2012), Goals of care, counseling/discussion (10/28/2019), Hardening of the aorta (main artery of the heart) ([place]) (01/13/2020), History of adenomatous polyp of colon (03/16/2021), Hyperglycemia due to type 2 diabetes mellitus ([place]) (03/09/2021), Hyperlipidemia, Hypertension, Hypertriglyceridemia (03/16/2021), Long term (current) use of insulin ([place]) (03/09/2021), Lumbar radiculopathy (11/30/2021), Lymphadenopathy, abdominal (10/28/2019), Malignant lymphoma, small lymphocytic ([place]) (11/13/2019), Mixed dyslipidemia (06/01/2020), Obesity (04/19/2012), Personal history of COVID-19 (11/13/2019), Pneumonia due to COVID-19 virus (11/08/2019), PONV (postoperative nausea and vomiting), 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 ([place]) (12/30/2019),  T2DM (type 2 diabetes mellitus) ([place]) (11/08/2019), Thyroid nodule (03/16/2021), and Type 2 diabetes mellitus with unspecified complications ([place]) (30/16/0109).   Surgical History:   Past Surgical History:  Procedure Laterality Date   BREAST BIOPSY Right    needle biopsy   COLONOSCOPY     POLYPECTOMY     Ruptured disc       Social History:   reports that she  has never smoked. She has never used smokeless tobacco. She reports that she does not drink alcohol and does not use drugs.   Family History:  Her family history is negative for Colon cancer, Esophageal cancer, Rectal cancer, Stomach cancer, and Pancreatic cancer.   Allergies Allergies  Allergen Reactions   Canagliflozin Other (See Comments)    recurrent vaginitis   Glucophage [Metformin] Other (See Comments)    GI intolerance   Niaspan [Niacin] Other (See Comments)    flushing     Home Medications  Prior to Admission medications   Medication Sig Start Date End Date Taking? Authorizing Provider  ACCU-CHEK GUIDE test strip 3 (three) times daily. 03/22/22   [provider]  amLODipine (NORVASC) 2.5 MG tablet TAKE 1 TABLET BY MOUTH EVERY DAY 09/22/21   Volanda Napoleon, MD  aspirin EC 81 MG tablet Take 81 mg by mouth daily. Swallow whole.    [provider]  BD INSULIN SYRINGE U-500 31G X 6MM 0.5 ML MISC 3 (three) times daily. as directed 03/23/22   [provider]  benazepril (LOTENSIN) 20 MG tablet Take 20 mg by mouth at bedtime.    [provider]  Continuous Blood Gluc Sensor (DEXCOM G7 SENSOR) MISC CHANGE SENSOR EVERY 10 DAYS 02/12/22   [provider]  Dulaglutide 3 MG/0.5ML SOPN Inject 3 mg into the skin once a week.    [provider]  famciclovir (FAMVIR) 500 MG tablet Take 1 tablet (500 mg total) by mouth daily. 12/16/21   Volanda Napoleon, MD  hydrochlorothiazide (HYDRODIURIL) 25 MG tablet Take 25 mg by mouth at bedtime. 11/11/21   [provider]  insulin regular human CONCENTRATED (HUMULIN R) 500 UNIT/ML injection Inject 50-90 Units into the skin See admin instructions. Inject 80 units subcutaneously before breakfast, and 50 units before lunch and 90 for supper    [provider]  Multiple Vitamins-Minerals (MULTIVITAMIN WITH MINERALS) tablet Take 1 tablet by mouth daily.    [provider]  potassium  chloride SA (KLOR-CON M) 20 MEQ tablet Take 20 mEq by mouth 2 (two) times daily. 02/18/22   [provider]  rosuvastatin (CRESTOR) 20 MG tablet Take 1 tablet (20 mg total) by mouth daily. 01/13/22   Jerline Pain, MD  Vitamin D3 (VITAMIN D) 25 MCG tablet Take 1,000 Units by mouth daily.    [provider]  prochlorperazine (COMPAZINE) 10 MG tablet Take 1 tablet (10 mg total) by mouth every 6 (six) hours as needed (Nausea or vomiting). 02/11/20 08/27/20  Volanda Napoleon, MD     Critical care time: 60 min    The patient is critically ill with multiple organ systems failure and requires high complexity decision making for assessment and support, frequent evaluation and titration of therapies, application of advanced monitoring technologies and extensive interpretation of multiple databases.    Rodman Pickle, M.D. [facility] Pulmonary/Critical Care Medicine 05/24/2022 2:20 AM   Please see Amion for pager number to reach on-call Pulmonary and Critical Care Team.

## 2022-05-23 NOTE — H&P (Signed)
History and Physical    Valerie Cline KXF:[id number] DOB: [birth year] DOA: 05/15/2022  PCP: Ginger Organ., MD  Patient coming from: [facility]  Chief Complaint: Fevers  HPI: Valerie Cline is a 66 y.o. female with medical history significant of small lymphocytic B cell non-Hodgkin lymphoma, hypertension, hyperlipidemia, insulin-dependent type 2 diabetes, CAD, carotid stenosis, reported recent hand-foot-and-mouth disease presented to [facility] ED on 7/21 with left upper extremity tremors.    Please see discharge summary from [facility] below:       UA done at [facility] showing negative nitrite, 2+ WBC, 3+ bacteria, 30 WBCs/ HPF.  CSF analysis done at [facility]: 55 WBCs with 84% neutrophils, 2 RBCs, glucose 147, total protein 107  COVID and influenza PCR negative at [place].  Patient is somnolent.  History provided by daughter at bedside who states patient recently had hand foot and mouth disease as her grandson had it.  She fully recovered from this illness but about 5 days ago started experiencing left upper extremity weakness and tremors and started having fevers.  States while at [facility], they noticed that she was also having right upper extremity tremors.  Per daughter, patient was more awake and alert earlier this afternoon at [place] but was given Dilaudid and Ativan prior to transport to [facility].  Review of Systems:  Review of Systems  Reason unable to perform ROS: Patient is somnolent.    Past Medical History:  Diagnosis Date   Allergy    Atherosclerotic heart disease of native coronary artery without angina pectoris 01/13/2020   Blood in stool    Carotid artery occlusion 08/13/2009   Carotid stenosis    Cervical lymphadenopathy 10/31/2019   CLL (chronic lymphocytic leukemia) ([place]) 69/67/8938   Complication of anesthesia    Coronary artery calcification seen on CT scan 06/01/2020   Diabetes mellitus without complication ([place])     Diabetic peripheral neuropathy associated with type 2 diabetes mellitus ([place]) 06/08/2009   Elevated liver function tests    Essential hypertension 11/08/2019   Fatty liver 09/06/2012   Goals of care, counseling/discussion 10/28/2019   Hardening of the aorta (main artery of the heart) ([place]) 01/13/2020   History of adenomatous polyp of colon 03/16/2021   Hyperglycemia due to type 2 diabetes mellitus ([place]) 03/09/2021   Hyperlipidemia    Hypertension    Hypertriglyceridemia 03/16/2021   Long term (current) use of insulin ([place]) 03/09/2021   Lumbar radiculopathy 11/30/2021   Lymphadenopathy, abdominal 10/28/2019   Malignant lymphoma, small lymphocytic ([place]) 11/13/2019   Mixed dyslipidemia 06/01/2020   Obesity 04/19/2012   Personal history of COVID-19 11/13/2019   Pneumonia due to COVID-19 virus 11/08/2019   PONV (postoperative nausea and vomiting)    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 ([place]) 12/30/2019   T2DM (type 2 diabetes mellitus) ([place]) 11/08/2019   Thyroid nodule 03/16/2021   Type 2 diabetes mellitus with unspecified complications ([place]) 08/16/5101    Past Surgical History:  Procedure Laterality Date   BREAST BIOPSY Right    needle biopsy   COLONOSCOPY     POLYPECTOMY     Ruptured disc       reports that she has never smoked. She has never used smokeless tobacco. She reports that she does not drink alcohol and does not use drugs.  Allergies  Allergen Reactions   Canagliflozin Other (See Comments)    recurrent vaginitis   Glucophage [Metformin] Other (See Comments)    GI intolerance  Niaspan [Niacin] Other (See Comments)    flushing    Family History  Problem Relation Age of Onset   Colon cancer Neg Hx    Esophageal cancer Neg Hx    Rectal cancer Neg Hx    Stomach cancer Neg Hx    Pancreatic cancer Neg Hx     Prior to Admission medications   Medication Sig Start Date End Date Taking? Authorizing Provider  ACCU-CHEK GUIDE test strip 3 (three)  times daily. 03/22/22   [provider]  amLODipine (NORVASC) 2.5 MG tablet TAKE 1 TABLET BY MOUTH EVERY DAY 09/22/21   Volanda Napoleon, MD  aspirin EC 81 MG tablet Take 81 mg by mouth daily. Swallow whole.    [provider]  BD INSULIN SYRINGE U-500 31G X 6MM 0.5 ML MISC 3 (three) times daily. as directed 03/23/22   [provider]  benazepril (LOTENSIN) 20 MG tablet Take 20 mg by mouth at bedtime.    [provider]  Continuous Blood Gluc Sensor (DEXCOM G7 SENSOR) MISC CHANGE SENSOR EVERY 10 DAYS 02/12/22   [provider]  Dulaglutide 3 MG/0.5ML SOPN Inject 3 mg into the skin once a week.    [provider]  famciclovir (FAMVIR) 500 MG tablet Take 1 tablet (500 mg total) by mouth daily. 12/16/21   Volanda Napoleon, MD  hydrochlorothiazide (HYDRODIURIL) 25 MG tablet Take 25 mg by mouth at bedtime. 11/11/21   [provider]  insulin regular human CONCENTRATED (HUMULIN R) 500 UNIT/ML injection Inject 50-90 Units into the skin See admin instructions. Inject 80 units subcutaneously before breakfast, and 50 units before lunch and 90 for supper    [provider]  Multiple Vitamins-Minerals (MULTIVITAMIN WITH MINERALS) tablet Take 1 tablet by mouth daily.    [provider]  potassium chloride SA (KLOR-CON M) 20 MEQ tablet Take 20 mEq by mouth 2 (two) times daily. 02/18/22   [provider]  rosuvastatin (CRESTOR) 20 MG tablet Take 1 tablet (20 mg total) by mouth daily. 01/13/22   Jerline Pain, MD  Vitamin D3 (VITAMIN D) 25 MCG tablet Take 1,000 Units by mouth daily.    [provider]  prochlorperazine (COMPAZINE) 10 MG tablet Take 1 tablet (10 mg total) by mouth every 6 (six) hours as needed (Nausea or vomiting). 02/11/20 08/27/20  Volanda Napoleon, MD    Physical Exam: Vitals:   05/27/2022 2115  BP: (!) 128/97  Pulse: (!) 115  Temp: 98.4 F (36.9 C)  TempSrc: Axillary  SpO2: 94%  Weight: 84.4 kg     Physical Exam Vitals reviewed.  Constitutional:      Appearance: She is ill-appearing and diaphoretic.  HENT:     Head: Normocephalic and atraumatic.  Eyes:     Comments: Unable to examine at this time as patient had her eyes closed  Cardiovascular:     Rate and Rhythm: Regular rhythm. Tachycardia present.     Pulses: Normal pulses.  Pulmonary:     Effort: Pulmonary effort is normal. No respiratory distress.     Breath sounds: No wheezing or rales.  Abdominal:     General: Bowel sounds are normal. There is no distension.     Palpations: Abdomen is soft.     Tenderness: There is no abdominal tenderness.  Musculoskeletal:        General: No swelling or tenderness.     Cervical back: Normal range of motion.  Skin:    General: Skin is  warm.  Neurological:     Comments: Very somnolent but was able to wake up and tell her daughter that she is having tremors.  She is oriented to self, knows she is at a hospital, and knows the year is 2023.  Having bilateral upper extremity tremors.  Moving bilateral lower extremities spontaneously.      Labs on Admission: I have personally reviewed following labs and imaging studies  CBC: Recent Labs  Lab 05/20/22 0918  WBC 6.2  NEUTROABS 4.9  HGB 11.8*  HCT 37.1  MCV 76.8*  PLT 161   Basic Metabolic Panel: Recent Labs  Lab 05/20/22 0918  NA 136  K 2.9*  CL 97*  CO2 27  GLUCOSE 213*  BUN 7*  CREATININE 0.84  CALCIUM 8.6*   GFR: Estimated Creatinine Clearance: 71.1 mL/min (by C-G formula based on SCr of 0.84 mg/dL). Liver Function Tests: Recent Labs  Lab 05/20/22 0918  AST 57*  ALT 47*  ALKPHOS 70  BILITOT 0.9  PROT 6.7  ALBUMIN 3.3*   No results for input(s): "LIPASE", "AMYLASE" in the last 168 hours. No results for input(s): "AMMONIA" in the last 168 hours. Coagulation Profile: No results for input(s): "INR", "PROTIME" in the last 168 hours. Cardiac Enzymes: No results for input(s): "CKTOTAL", "CKMB",  "CKMBINDEX", "TROPONINI" in the last 168 hours. BNP (last 3 results) No results for input(s): "PROBNP" in the last 8760 hours. HbA1C: No results for input(s): "HGBA1C" in the last 72 hours. CBG: No results for input(s): "GLUCAP" in the last 168 hours. Lipid Profile: No results for input(s): "CHOL", "HDL", "LDLCALC", "TRIG", "CHOLHDL", "LDLDIRECT" in the last 72 hours. Thyroid Function Tests: No results for input(s): "TSH", "T4TOTAL", "FREET4", "T3FREE", "THYROIDAB" in the last 72 hours. Anemia Panel: No results for input(s): "VITAMINB12", "FOLATE", "FERRITIN", "TIBC", "IRON", "RETICCTPCT" in the last 72 hours. Urine analysis:    Component Value Date/Time   COLORURINE STRAW (A) 10/26/2019 1857   APPEARANCEUR HAZY (A) 10/26/2019 1857   LABSPEC >1.030 (H) 10/26/2019 1857   PHURINE 5.5 10/26/2019 1857   GLUCOSEU NEGATIVE 10/26/2019 1857   HGBUR TRACE (A) 10/26/2019 1857   BILIRUBINUR NEGATIVE 10/26/2019 1857   KETONESUR NEGATIVE 10/26/2019 1857   PROTEINUR NEGATIVE 10/26/2019 1857   NITRITE NEGATIVE 10/26/2019 1857   LEUKOCYTESUR SMALL (A) 10/26/2019 1857    Radiological Exams on Admission: I have personally reviewed images No results found.  Assessment and Plan  Fevers/concern for meningitis -CSF analysis done at [facility]: 55 WBCs with 84% neutrophils, 2 RBCs, glucose 147, total protein 107. -Continue acyclovir, vancomycin, ampicillin, ceftriaxone -Testing done at [facility] for Lyme, HSV, enterovirus, Ehrlichia pending -Repeat labs to check WBC count, lactate -Unable to find blood culture results from [place] in the paper chart, repeat blood cultures ordered -Consult infectious disease in the morning.  Acute hypoxic respiratory failure secondary to multifocal pneumonia -CT done at [facility] concerning for multifocal pneumonia -Arrived on 8 L O2 via HFNC, continue supplemental oxygen -Continue antibiotics -COVID and influenza PCR negative at [facility] -Blood cultures -Repeat  labs to check CBC, lactate -Strep pneumo/Legionella urinary antigens  Severe sepsis Likely secondary to meningitis and pneumonia.  Met criteria for sepsis at outside hospital with fever, tachycardia, tachypnea, and acute hypoxia. -Continue antibiotics -Continue IV fluid hydration -Blood cultures ordered  Heterogeneous enlargement of the left thyroid lobe on CT -Check TSH and free T4 -Thyroid ultrasound ordered  Left upper extremity tremors Meningioma Cervical myelopathy MRI done at [facility] showing 8 mm presumed meningioma at the left frontal  vertex.  EEG done at [place] without signs of seizure.  MRI of C-spine showing C4-5 cord compression from advanced disc degeneration with posterior endplate ridging.  Showing left foraminal impingement at the same level.  Also showing C5-6 and C6-7 ADCF with solid arthrodesis.  Neurologist at [facility] had recommended neurosurgical evaluation. -Consult neurosurgery in the morning. -Patient will need outpatient neurology follow-up in 3 to 6 weeks with consideration of EMG/NCS if unable to localize the cause of her left upper extremity tremors  UTI UA done at [facility] showing negative nitrite, 2+ WBC, 3+ bacteria, 30 WBCs/ HPF. -On antibiotics -Repeat UA and urine culture  Small lymphocytic B cell non-Hodgkin lymphoma Was on Rituxan until June this year.  CT done at [facility] showed no enlarged lymph nodes or other evidence of lymphoma within the chest/abdomen/pelvis. -Outpatient oncology follow-up  Hypertension Blood pressure stable. -Avoid antihypertensives at this time given concern for severe sepsis.  Hyperlipidemia  Insulin-dependent type 2 diabetes A1c 7.3 in January 2023. -Repeat A1c -Sensitive sliding scale insulin -Resume home basal insulin after pharmacy med rec is done.  CAD Not endorsing any anginal symptoms. -EKG ordered  DVT prophylaxis: {Blank single:19197::"Lovenox","SQ Heparin","IV heparin  gtt","Xarelto","Eliquis","Coumadin","SCDs","***"} Code Status: {Blank single:19197::"Full Code","DNR","DNR/DNI","Comfort Care","***"} Family Communication: ***  Consults called: ***  Level of care: {Blank single:19197::"Med-Surg","Telemetry bed","Progressive Care Unit","Step Down Unit"} Admission status: ***  Shela Leff MD [facility]  If 7PM-7AM, please contact night-coverage [email or web address]  05/08/2022, 9:55 PM

## 2022-05-24 ENCOUNTER — Inpatient Hospital Stay (HOSPITAL_COMMUNITY): Payer: Medicare Other

## 2022-05-24 ENCOUNTER — Inpatient Hospital Stay: Payer: Self-pay

## 2022-05-24 DIAGNOSIS — R6520 Severe sepsis without septic shock: Secondary | ICD-10-CM | POA: Diagnosis present

## 2022-05-24 DIAGNOSIS — R4182 Altered mental status, unspecified: Secondary | ICD-10-CM

## 2022-05-24 DIAGNOSIS — J189 Pneumonia, unspecified organism: Secondary | ICD-10-CM

## 2022-05-24 DIAGNOSIS — G959 Disease of spinal cord, unspecified: Secondary | ICD-10-CM

## 2022-05-24 DIAGNOSIS — A879 Viral meningitis, unspecified: Principal | ICD-10-CM | POA: Insufficient documentation

## 2022-05-24 DIAGNOSIS — R9431 Abnormal electrocardiogram [ECG] [EKG]: Secondary | ICD-10-CM | POA: Diagnosis not present

## 2022-05-24 DIAGNOSIS — D329 Benign neoplasm of meninges, unspecified: Secondary | ICD-10-CM

## 2022-05-24 DIAGNOSIS — G039 Meningitis, unspecified: Secondary | ICD-10-CM | POA: Diagnosis not present

## 2022-05-24 DIAGNOSIS — G9341 Metabolic encephalopathy: Secondary | ICD-10-CM

## 2022-05-24 DIAGNOSIS — J9601 Acute respiratory failure with hypoxia: Secondary | ICD-10-CM

## 2022-05-24 DIAGNOSIS — A419 Sepsis, unspecified organism: Secondary | ICD-10-CM | POA: Diagnosis present

## 2022-05-24 DIAGNOSIS — N390 Urinary tract infection, site not specified: Secondary | ICD-10-CM

## 2022-05-24 LAB — POCT I-STAT 7, (LYTES, BLD GAS, ICA,H+H)
Acid-Base Excess: 5 mmol/L — ABNORMAL HIGH (ref 0.0–2.0)
Acid-Base Excess: 3 mmol/L — ABNORMAL HIGH (ref 0.0–2.0)
Bicarbonate: 32.1 mmol/L — ABNORMAL HIGH (ref 20.0–28.0)
Bicarbonate: 30.5 mmol/L — ABNORMAL HIGH (ref 20.0–28.0)
Calcium, Ion: 1.17 mmol/L (ref 1.15–1.40)
Calcium, Ion: 1.12 mmol/L — ABNORMAL LOW (ref 1.15–1.40)
HCT: 33 % — ABNORMAL LOW (ref 36.0–46.0)
HCT: 28 % — ABNORMAL LOW (ref 36.0–46.0)
Hemoglobin: 11.2 g/dL — ABNORMAL LOW (ref 12.0–15.0)
Hemoglobin: 9.5 g/dL — ABNORMAL LOW (ref 12.0–15.0)
O2 Saturation: 100 %
O2 Saturation: 95 %
Patient temperature: 39.8
Patient temperature: 36.8
Potassium: 2.9 mmol/L — ABNORMAL LOW (ref 3.5–5.1)
Potassium: 3.9 mmol/L (ref 3.5–5.1)
Sodium: 138 mmol/L (ref 135–145)
Sodium: 137 mmol/L (ref 135–145)
TCO2: 34 mmol/L — ABNORMAL HIGH (ref 22–32)
TCO2: 32 mmol/L (ref 22–32)
pCO2 arterial: 66.9 mmHg (ref 32–48)
pCO2 arterial: 61.5 mmHg — ABNORMAL HIGH (ref 32–48)
pH, Arterial: 7.301 — ABNORMAL LOW (ref 7.35–7.45)
pH, Arterial: 7.303 — ABNORMAL LOW (ref 7.35–7.45)
pO2, Arterial: 281 mmHg — ABNORMAL HIGH (ref 83–108)
pO2, Arterial: 83 mmHg (ref 83–108)

## 2022-05-24 LAB — BODY FLUID CELL COUNT WITH DIFFERENTIAL
Eos, Fluid: 3 %
Lymphs, Fluid: 18 %
Monocyte-Macrophage-Serous Fluid: 33 % — ABNORMAL LOW (ref 50–90)
Neutrophil Count, Fluid: 46 % — ABNORMAL HIGH (ref 0–25)
Total Nucleated Cell Count, Fluid: 120 cu mm (ref 0–1000)

## 2022-05-24 LAB — LACTIC ACID, PLASMA: Lactic Acid, Venous: 1 mmol/L (ref 0.5–1.9)

## 2022-05-24 LAB — URINALYSIS, ROUTINE W REFLEX MICROSCOPIC
Bilirubin Urine: NEGATIVE
Glucose, UA: >=500 mg/dL — AB
Ketones, ur: 20 mg/dL — AB
Leukocytes,Ua: NEGATIVE
Nitrite: NEGATIVE
Protein, ur: 100 mg/dL — AB
Specific Gravity, Urine: 1.011 (ref 1.005–1.030)
pH: 5 (ref 5.0–8.0)

## 2022-05-24 LAB — GLUCOSE, CAPILLARY
Glucose-Capillary: 159 mg/dL — ABNORMAL HIGH (ref 70–99)
Glucose-Capillary: 191 mg/dL — ABNORMAL HIGH (ref 70–99)
Glucose-Capillary: 207 mg/dL — ABNORMAL HIGH (ref 70–99)
Glucose-Capillary: 228 mg/dL — ABNORMAL HIGH (ref 70–99)
Glucose-Capillary: 266 mg/dL — ABNORMAL HIGH (ref 70–99)
Glucose-Capillary: 338 mg/dL — ABNORMAL HIGH (ref 70–99)
Glucose-Capillary: 489 mg/dL — ABNORMAL HIGH (ref 70–99)

## 2022-05-24 LAB — T4, FREE: Free T4: 1.29 ng/dL — ABNORMAL HIGH (ref 0.61–1.12)

## 2022-05-24 LAB — ECHOCARDIOGRAM COMPLETE
AR max vel: 1.79 cm2
AV Peak grad: 14.3 mmHg
Ao pk vel: 1.89 m/s
Area-P 1/2: 4.17 cm2
Calc EF: 59.2 %
Height: 65.984 in
S' Lateral: 4.2 cm
Single Plane A2C EF: 60.7 %
Single Plane A4C EF: 58.8 %
Weight: 2836 oz

## 2022-05-24 LAB — TSH: TSH: 0.698 u[IU]/mL (ref 0.350–4.500)

## 2022-05-24 LAB — CBC
HCT: 27.2 % — ABNORMAL LOW (ref 36.0–46.0)
Hemoglobin: 8.6 g/dL — ABNORMAL LOW (ref 12.0–15.0)
MCH: 25 pg — ABNORMAL LOW (ref 26.0–34.0)
MCHC: 31.6 g/dL (ref 30.0–36.0)
MCV: 79.1 fL — ABNORMAL LOW (ref 80.0–100.0)
Platelets: 204 10*3/uL (ref 150–400)
RBC: 3.44 MIL/uL — ABNORMAL LOW (ref 3.87–5.11)
RDW: 15.7 % — ABNORMAL HIGH (ref 11.5–15.5)
WBC: 9.7 10*3/uL (ref 4.0–10.5)
nRBC: 0 % (ref 0.0–0.2)

## 2022-05-24 LAB — COMPREHENSIVE METABOLIC PANEL
ALT: 48 U/L — ABNORMAL HIGH (ref 0–44)
AST: 50 U/L — ABNORMAL HIGH (ref 15–41)
Albumin: 2.1 g/dL — ABNORMAL LOW (ref 3.5–5.0)
Alkaline Phosphatase: 52 U/L (ref 38–126)
Anion gap: 12 (ref 5–15)
BUN: 7 mg/dL — ABNORMAL LOW (ref 8–23)
CO2: 27 mmol/L (ref 22–32)
Calcium: 7.3 mg/dL — ABNORMAL LOW (ref 8.9–10.3)
Chloride: 94 mmol/L — ABNORMAL LOW (ref 98–111)
Creatinine, Ser: 0.84 mg/dL (ref 0.44–1.00)
GFR, Estimated: >60 mL/min (ref >60)
Glucose, Bld: 481 mg/dL — ABNORMAL HIGH (ref 70–99)
Potassium: 2.7 mmol/L — CL (ref 3.5–5.1)
Sodium: 133 mmol/L — ABNORMAL LOW (ref 135–145)
Total Bilirubin: 0.9 mg/dL (ref 0.3–1.2)
Total Protein: 5 g/dL — ABNORMAL LOW (ref 6.5–8.1)

## 2022-05-24 LAB — RESPIRATORY PANEL BY PCR

## 2022-05-24 LAB — BASIC METABOLIC PANEL
Anion gap: 7 (ref 5–15)
BUN: 10 mg/dL (ref 8–23)
CO2: 30 mmol/L (ref 22–32)
Calcium: 7.6 mg/dL — ABNORMAL LOW (ref 8.9–10.3)
Chloride: 102 mmol/L (ref 98–111)
Creatinine, Ser: 0.82 mg/dL (ref 0.44–1.00)
GFR, Estimated: >60 mL/min (ref >60)
Glucose, Bld: 162 mg/dL — ABNORMAL HIGH (ref 70–99)
Potassium: 3.7 mmol/L (ref 3.5–5.1)
Sodium: 139 mmol/L (ref 135–145)

## 2022-05-24 LAB — PROTIME-INR
INR: 1.2 (ref 0.8–1.2)
Prothrombin Time: 15.3 seconds — ABNORMAL HIGH (ref 11.4–15.2)

## 2022-05-24 LAB — HEMOGLOBIN A1C
Hgb A1c MFr Bld: 8 % — ABNORMAL HIGH (ref 4.8–5.6)
Mean Plasma Glucose: 182.9 mg/dL

## 2022-05-24 LAB — HIV ANTIBODY (ROUTINE TESTING W REFLEX): HIV Screen 4th Generation wRfx: NONREACTIVE

## 2022-05-24 LAB — CK: Total CK: 953 U/L — ABNORMAL HIGH (ref 38–234)

## 2022-05-24 LAB — STREP PNEUMONIAE URINARY ANTIGEN: Strep Pneumo Urinary Antigen: NEGATIVE

## 2022-05-24 MED ORDER — ACETAMINOPHEN 160 MG/5ML PO SOLN
650.0000 mg | Freq: Once | ORAL | Status: AC
  Administered 2022-05-24: 650 mg via ORAL
  Filled 2022-05-24: qty 20.3

## 2022-05-24 MED ORDER — VANCOMYCIN HCL IN DEXTROSE 1-5 GM/200ML-% IV SOLN
1000.0000 mg | Freq: Once | INTRAVENOUS | Status: AC
  Administered 2022-05-24: 1000 mg via INTRAVENOUS
  Filled 2022-05-24: qty 200

## 2022-05-24 MED ORDER — SODIUM CHLORIDE 0.9 % IV SOLN: INTRAVENOUS | Status: DC

## 2022-05-24 MED ORDER — POLYETHYLENE GLYCOL 3350 17 G PO PACK
17.0000 g | PACK | Freq: Every day | ORAL | Status: DC
  Administered 2022-05-25 – 2022-05-31 (×5): 17 g
  Filled 2022-05-24 (×5): qty 1

## 2022-05-24 MED ORDER — PANTOPRAZOLE 2 MG/ML SUSPENSION
40.0000 mg | Freq: Every day | ORAL | Status: DC
  Administered 2022-05-24 – 2022-06-24 (×32): 40 mg
  Filled 2022-05-24 (×31): qty 20

## 2022-05-24 MED ORDER — DOCUSATE SODIUM 50 MG/5ML PO LIQD
100.0000 mg | Freq: Two times a day (BID) | ORAL | Status: DC
  Administered 2022-05-24 – 2022-05-31 (×12): 100 mg
  Filled 2022-05-24 (×12): qty 10

## 2022-05-24 MED ORDER — CHLORHEXIDINE GLUCONATE CLOTH 2 % EX PADS
6.0000 | MEDICATED_PAD | Freq: Every day | CUTANEOUS | Status: DC
  Administered 2022-05-24 – 2022-06-24 (×39): 6 via TOPICAL

## 2022-05-24 MED ORDER — INSULIN ASPART 100 UNIT/ML IJ SOLN
0.0000 [IU] | INTRAMUSCULAR | Status: DC
  Administered 2022-05-24: 4 [IU] via SUBCUTANEOUS
  Administered 2022-05-24: 11 [IU] via SUBCUTANEOUS
  Administered 2022-05-24 (×2): 4 [IU] via SUBCUTANEOUS
  Administered 2022-05-25: 11 [IU] via SUBCUTANEOUS
  Administered 2022-05-25 (×2): 15 [IU] via SUBCUTANEOUS
  Administered 2022-05-25: 7 [IU] via SUBCUTANEOUS
  Administered 2022-05-25: 11 [IU] via SUBCUTANEOUS
  Administered 2022-05-25 (×2): 15 [IU] via SUBCUTANEOUS
  Administered 2022-05-26 (×2): 7 [IU] via SUBCUTANEOUS
  Administered 2022-05-26 (×3): 11 [IU] via SUBCUTANEOUS
  Administered 2022-05-27 (×5): 7 [IU] via SUBCUTANEOUS
  Administered 2022-05-27: 20 [IU] via SUBCUTANEOUS
  Administered 2022-05-28 (×3): 4 [IU] via SUBCUTANEOUS
  Administered 2022-05-28: 11 [IU] via SUBCUTANEOUS
  Administered 2022-05-28: 4 [IU] via SUBCUTANEOUS
  Administered 2022-05-28: 7 [IU] via SUBCUTANEOUS
  Administered 2022-05-29: 11 [IU] via SUBCUTANEOUS

## 2022-05-24 MED ORDER — DEXAMETHASONE SODIUM PHOSPHATE 10 MG/ML IJ SOLN
10.0000 mg | Freq: Four times a day (QID) | INTRAMUSCULAR | Status: DC
  Administered 2022-05-24 – 2022-05-25 (×5): 10 mg via INTRAVENOUS
  Filled 2022-05-24 (×7): qty 1

## 2022-05-24 MED ORDER — INSULIN DETEMIR 100 UNIT/ML ~~LOC~~ SOLN
35.0000 [IU] | Freq: Two times a day (BID) | SUBCUTANEOUS | Status: DC
  Administered 2022-05-24 – 2022-05-25 (×3): 35 [IU] via SUBCUTANEOUS
  Filled 2022-05-24 (×4): qty 0.35

## 2022-05-24 MED ORDER — POTASSIUM CHLORIDE 20 MEQ PO PACK
40.0000 meq | PACK | Freq: Once | ORAL | Status: AC
  Administered 2022-05-24: 40 meq
  Filled 2022-05-24: qty 2

## 2022-05-24 MED ORDER — KETAMINE HCL 50 MG/5ML IJ SOSY
PREFILLED_SYRINGE | INTRAMUSCULAR | Status: AC
  Filled 2022-05-24: qty 5

## 2022-05-24 MED ORDER — MIDAZOLAM HCL 2 MG/2ML IJ SOLN
INTRAMUSCULAR | Status: AC
  Filled 2022-05-24: qty 2

## 2022-05-24 MED ORDER — FENTANYL BOLUS VIA INFUSION
25.0000 ug | INTRAVENOUS | Status: DC | PRN
  Administered 2022-05-26 – 2022-05-27 (×5): 50 ug via INTRAVENOUS

## 2022-05-24 MED ORDER — PROPOFOL 1000 MG/100ML IV EMUL
INTRAVENOUS | Status: AC
  Administered 2022-05-24: 20 ug/kg/min via INTRAVENOUS
  Filled 2022-05-24: qty 100

## 2022-05-24 MED ORDER — ETOMIDATE 2 MG/ML IV SOLN
INTRAVENOUS | Status: AC
  Filled 2022-05-24: qty 20

## 2022-05-24 MED ORDER — POTASSIUM CHLORIDE 10 MEQ/50ML IV SOLN
10.0000 meq | INTRAVENOUS | Status: AC
  Administered 2022-05-24 (×4): 10 meq via INTRAVENOUS
  Filled 2022-05-24 (×4): qty 50

## 2022-05-24 MED ORDER — SODIUM CHLORIDE 0.9 % IV SOLN: INTRAVENOUS | Status: DC | PRN

## 2022-05-24 MED ORDER — VITAL HIGH PROTEIN PO LIQD
1000.0000 mL | ORAL | Status: DC
  Administered 2022-05-24: 1000 mL

## 2022-05-24 MED ORDER — SODIUM CHLORIDE 0.9 % IV SOLN
2.0000 g | Freq: Three times a day (TID) | INTRAVENOUS | Status: DC
  Administered 2022-05-24 – 2022-05-26 (×6): 2 g via INTRAVENOUS
  Filled 2022-05-24 (×6): qty 12.5

## 2022-05-24 MED ORDER — MIDAZOLAM HCL 2 MG/2ML IJ SOLN
2.0000 mg | Freq: Once | INTRAMUSCULAR | Status: AC
Start: 2022-05-24 — End: 2022-05-24
  Administered 2022-05-24: 1 mg via INTRAVENOUS

## 2022-05-24 MED ORDER — SODIUM CHLORIDE 0.9 % IV SOLN
2.0000 g | INTRAVENOUS | Status: DC
  Administered 2022-05-24 (×3): 2 g via INTRAVENOUS
  Filled 2022-05-24 (×4): qty 2000

## 2022-05-24 MED ORDER — PROPOFOL 1000 MG/100ML IV EMUL
5.0000 ug/kg/min | INTRAVENOUS | Status: DC
  Administered 2022-05-24: 25 ug/kg/min via INTRAVENOUS
  Administered 2022-05-24 – 2022-05-25 (×3): 30 ug/kg/min via INTRAVENOUS
  Administered 2022-05-25: 25 ug/kg/min via INTRAVENOUS
  Administered 2022-05-25: 30 ug/kg/min via INTRAVENOUS
  Administered 2022-05-25: 40 ug/kg/min via INTRAVENOUS
  Administered 2022-05-26: 15 ug/kg/min via INTRAVENOUS
  Filled 2022-05-24 (×3): qty 100
  Filled 2022-05-24: qty 200
  Filled 2022-05-24 (×2): qty 100

## 2022-05-24 MED ORDER — ROCURONIUM BROMIDE 50 MG/5ML IV SOLN
90.0000 mg | Freq: Once | INTRAVENOUS | Status: AC
  Administered 2022-05-24: 90 mg via INTRAVENOUS

## 2022-05-24 MED ORDER — SUCCINYLCHOLINE CHLORIDE 200 MG/10ML IV SOSY
PREFILLED_SYRINGE | INTRAVENOUS | Status: DC
Start: 2022-05-24 — End: 2022-05-24
  Filled 2022-05-24: qty 10

## 2022-05-24 MED ORDER — ETOMIDATE 2 MG/ML IV SOLN
20.0000 mg | Freq: Once | INTRAVENOUS | Status: AC
  Administered 2022-05-24: 20 mg via INTRAVENOUS

## 2022-05-24 MED ORDER — NOREPINEPHRINE 4 MG/250ML-% IV SOLN
0.0000 ug/min | INTRAVENOUS | Status: DC
  Administered 2022-05-24: 2 ug/min via INTRAVENOUS
  Administered 2022-05-25: 3 ug/min via INTRAVENOUS
  Filled 2022-05-24 (×2): qty 250

## 2022-05-24 MED ORDER — VITAL AF 1.2 CAL PO LIQD
1000.0000 mL | ORAL | Status: DC
  Administered 2022-05-24 – 2022-06-07 (×14): 1000 mL
  Filled 2022-05-24 (×3): qty 1000

## 2022-05-24 MED ORDER — HYDRALAZINE HCL 20 MG/ML IJ SOLN
10.0000 mg | Freq: Four times a day (QID) | INTRAMUSCULAR | Status: DC | PRN
  Filled 2022-05-24: qty 1

## 2022-05-24 MED ORDER — FENTANYL CITRATE PF 50 MCG/ML IJ SOSY
PREFILLED_SYRINGE | INTRAMUSCULAR | Status: AC
  Filled 2022-05-24: qty 2

## 2022-05-24 MED ORDER — FENTANYL 2500MCG IN NS 250ML (10MCG/ML) PREMIX INFUSION
0.0000 ug/h | INTRAVENOUS | Status: DC
  Administered 2022-05-24: 50 ug/h via INTRAVENOUS
  Administered 2022-05-24: 200 ug/h via INTRAVENOUS
  Administered 2022-05-25: 125 ug/h via INTRAVENOUS
  Administered 2022-05-25 – 2022-05-26 (×2): 150 ug/h via INTRAVENOUS
  Administered 2022-05-27: 200 ug/h via INTRAVENOUS
  Filled 2022-05-24 (×6): qty 250

## 2022-05-24 MED ORDER — CHLORHEXIDINE GLUCONATE CLOTH 2 % EX PADS
6.0000 | MEDICATED_PAD | Freq: Every day | CUTANEOUS | Status: DC
  Administered 2022-05-24 – 2022-06-03 (×9): 6 via TOPICAL

## 2022-05-24 MED ORDER — DEXTROSE 5 % IV SOLN
750.0000 mg | Freq: Three times a day (TID) | INTRAVENOUS | Status: DC
  Administered 2022-05-24 – 2022-05-26 (×8): 750 mg via INTRAVENOUS
  Filled 2022-05-24 (×10): qty 15

## 2022-05-24 MED ORDER — SODIUM CHLORIDE 0.9 % IV SOLN: 2.0000 g | Freq: Two times a day (BID) | INTRAVENOUS | Status: DC

## 2022-05-24 MED ORDER — SODIUM CHLORIDE 0.9 % IV SOLN
1.0000 g | Freq: Two times a day (BID) | INTRAVENOUS | Status: DC
  Administered 2022-05-24: 1 g via INTRAVENOUS
  Filled 2022-05-24: qty 10

## 2022-05-24 MED ORDER — SODIUM CHLORIDE 0.9% FLUSH: 10.0000 mL | INTRAVENOUS | Status: DC | PRN

## 2022-05-24 MED ORDER — SODIUM CHLORIDE 0.9% FLUSH
10.0000 mL | Freq: Two times a day (BID) | INTRAVENOUS | Status: DC
  Administered 2022-05-24 – 2022-06-03 (×14): 10 mL
  Administered 2022-06-04: 20 mL

## 2022-05-24 MED ORDER — PROSOURCE TF PO LIQD
45.0000 mL | Freq: Two times a day (BID) | ORAL | Status: DC
  Administered 2022-05-24: 45 mL
  Filled 2022-05-24: qty 45

## 2022-05-24 MED ORDER — FENTANYL CITRATE PF 50 MCG/ML IJ SOSY: 25.0000 ug | PREFILLED_SYRINGE | Freq: Once | INTRAMUSCULAR | Status: DC

## 2022-05-24 MED ORDER — ROCURONIUM BROMIDE 10 MG/ML (PF) SYRINGE
PREFILLED_SYRINGE | INTRAVENOUS | Status: AC
  Filled 2022-05-24: qty 10

## 2022-05-24 MED ORDER — VANCOMYCIN HCL IN DEXTROSE 1-5 GM/200ML-% IV SOLN
1000.0000 mg | Freq: Two times a day (BID) | INTRAVENOUS | Status: DC
  Administered 2022-05-24 – 2022-05-25 (×4): 1000 mg via INTRAVENOUS
  Filled 2022-05-24 (×4): qty 200

## 2022-05-24 MED ORDER — ACETAMINOPHEN 325 MG PO TABS
650.0000 mg | ORAL_TABLET | Freq: Four times a day (QID) | ORAL | Status: DC | PRN
  Administered 2022-05-24 – 2022-05-28 (×7): 650 mg
  Filled 2022-05-24 (×8): qty 2

## 2022-05-24 MED ORDER — FENTANYL CITRATE PF 50 MCG/ML IJ SOSY
100.0000 ug | PREFILLED_SYRINGE | Freq: Once | INTRAMUSCULAR | Status: AC
  Administered 2022-05-24: 100 ug via INTRAVENOUS

## 2022-05-24 NOTE — Progress Notes (Signed)
Peripherally Inserted Central Catheter Placement  The IV Nurse has discussed with the patient and/or persons authorized to consent for the patient, the purpose of this procedure and the potential benefits and risks involved with this procedure.  The benefits include less needle sticks, lab draws from the catheter, and the patient may be discharged home with the catheter. Risks include, but not limited to, infection, bleeding, blood clot (thrombus formation), and puncture of an artery; nerve damage and irregular heartbeat and possibility to perform a PICC exchange if needed/ordered by physician.  Alternatives to this procedure were also discussed.  Bard Power PICC patient education guide, fact sheet on infection prevention and patient information card has been provided to patient /or left at bedside.    PICC Placement Documentation  PICC Triple Lumen 59/56/38 Left Basilic 39 cm 0 cm (Active)  Indication for Insertion or Continuance of Line Vasoactive infusions 05/24/22 2310  Exposed Catheter (cm) 0 cm 05/24/22 2310  Site Assessment Clean, Dry, Intact 05/24/22 2310  Lumen #1 Status Flushed;Saline locked;Blood return noted 05/24/22 2310  Lumen #2 Status Flushed;Saline locked;Blood return noted 05/24/22 2310  Lumen #3 Status Flushed;Saline locked;Blood return noted 05/24/22 2310  Dressing Type Transparent;Securing device 05/24/22 2310  Dressing Status Antimicrobial disc in place;Clean, Dry, Intact 05/24/22 2310  Dressing Intervention New dressing 05/24/22 2310  Dressing Change Due 05/31/22 05/24/22 [place] 05/24/2022, 11:15 PM

## 2022-05-24 NOTE — Progress Notes (Signed)
Initial Nutrition Assessment  DOCUMENTATION CODES:   Not applicable  INTERVENTION:   Tube Feeding via OG:  Vital AF 1.2 at 60 ml/hr This provides 108 g of protein, 1728 kcals, 1166 mL per hour  Additional calories from fat via propofol currently   NUTRITION DIAGNOSIS:   Inadequate oral intake related to acute illness as evidenced by NPO status.  GOAL:   Patient will meet greater than or equal to 90% of their needs  MONITOR:   Vent status, Labs, Weight trends, TF tolerance  REASON FOR ASSESSMENT:   Consult, Ventilator Enteral/tube feeding initiation and management  ASSESSMENT:   66 yo female admitted with acute respiratory failure secondary to pneumonia, acute encephalopathy secondary to suspected bacterial vs viral meningitis in immunocompromised patient. 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 Unable to obtain diet and weight history from pt at this time.   Labs: CBGs [id number] Meds: ss novolog, levemir, miralax, colace   NUTRITION - FOCUSED PHYSICAL EXAM:  Unable to assess  Diet Order:   Diet Order             Diet NPO time specified  Diet effective now                   EDUCATION NEEDS:   Not appropriate for education at this time  Skin:  Skin Assessment: Reviewed RN Assessment  Last BM:  7/25  Height:   Ht Readings from Last 1 Encounters:  05/24/22 5' 5.98" (1.676 m)    Weight:   Wt Readings from Last 1 Encounters:  05/24/22 80.4 kg    BMI:  Body mass index is 28.62 kg/m.  Estimated Nutritional Needs:   Kcal:  1700-1900 kcals  Protein:  90-110 g  Fluid:  >/= 1.7 L   Kerman Passey MS, RDN, LDN, CNSC Registered Dietitian 3 Clinical Nutrition RD Pager and On-Call Pager Number Located in [place]

## 2022-05-24 NOTE — Procedures (Signed)
Patient Name: Valerie Cline  MRN: [id number]  Epilepsy Attending: Lora Havens  Referring Physician/Provider: Candee Furbish, MD  Date: 05/24/2022 Duration: 21.25 mins  Patient history: 66yo F with ams. EEG to evaluate for seizure  Level of alertness: Awake  AEDs during EEG study: Propofol  Technical aspects: This EEG study was done with scalp electrodes positioned according to the 10-20 International system of electrode placement. Electrical activity was acquired at a sampling rate of '500Hz'$  and reviewed with a high frequency filter of '70Hz'$  and a low frequency filter of '1Hz'$ . EEG data were recorded continuously and digitally stored.   Description: EEG showed continuous generalized 3 to 6 Hz theta-delta slowing. Hyperventilation and photic stimulation were not performed.     Of note, eeg was technically difficult due to significant myogenic artifact.  ABNORMALITY - Continuous slow, generalized  IMPRESSION: This technically difficult study is suggestive of severe diffuse encephalopathy, nonspecific etiology. No seizures or epileptiform discharges were seen throughout the recording.  Fartun Paradiso [name]

## 2022-05-24 NOTE — Procedures (Signed)
Arterial Line Insertion Start/End7/25/2023 1:50 PM, 05/24/2022 2:00 PM  Patient location: ICU. Preanesthetic checklist: patient identified, IV checked, risks and benefits discussed, surgical consent, monitors and equipment checked, pre-op evaluation and timeout performed Right, radial was placed Catheter size: 20 G Hand hygiene performed  and maximum sterile barriers used  Allen's test indicative of satisfactory collateral circulation Attempts: 2 Procedure performed without using ultrasound guided technique. Following insertion, dressing applied and Biopatch. Post procedure assessment: normal  Patient tolerated the procedure well with no immediate complications.

## 2022-05-24 NOTE — Progress Notes (Signed)
[facility] ADULT ICU REPLACEMENT PROTOCOL   The patient does apply for the [facility] Adult ICU Electrolyte Replacment Protocol based on the criteria listed below:   1.Exclusion criteria: TCTS patients, ECMO patients, and Dialysis patients 2. Is GFR >/= 30 ml/min? Yes.    Patient's GFR today is >60 3. Is SCr </= 2? Yes.   Patient's SCr is 0.84 mg/dL 4. Did SCr increase >/= 0.5 in 24 hours? No. 5.Pt's weight >40kg  Yes.   6. Abnormal electrolyte(s): K+ 2.7  7. Electrolytes replaced per protocol 8.  Call MD STAT for K+ </= 2.5, Phos </= 1, or Mag </= 1 Physician:  n/a  Valerie Cline 05/24/2022 4:02 AM

## 2022-05-24 NOTE — Progress Notes (Signed)
eLink Physician-Brief Progress Note Patient Name: Valerie Cline DOB: [birth year] MRN: [id number]   Date of Service  05/24/2022  HPI/Events of Note  Agitation  Ventilator Asynchromy - Currently on a Fentanyl IV infusion.   eICU Interventions  Plan: Increase ceiling dose of Fentanyl IV infusion to 400 mcg/min. Titrate to RASS = 0 to -1.     Intervention Category Major Interventions: Delirium, psychosis, severe agitation - evaluation and management  Laron Boorman [place] 05/24/2022, 3:40 AM

## 2022-05-24 NOTE — Consult Note (Addendum)
Neurology Consultation  Reason for Consult: upper extremity tremors Referring Physician: Dr. Tamala Julian  CC: Fevers  History is obtained from:daughter and husband and medical record   HPI: Valerie Cline is a 66 y.o. 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  Per daughter and husband she had been sick with hand foot and mouth disease that she contracted from her grandkids. She had been having fevers on and off. On Wednesday she had c/o her left upper arm getting contracted and start jerking, this would cause her neck to stiffen and c/o pain in neck and arm. She then developed left upper extremity weakness. She went to the [facility] ER on Friday 7/21 and had a CT head with no acute abnormality. She left without being treated due to a long wait time and went to [facility] ED to be evaluated.  At [facility] MRI brain, per report, no acute infarct, 32m focus of T2 hyperintense signal abnormality T2 signal loss along the high posterior left frontal lobe, may reflect artifact from dural calcifications and or bridging veins. However a small calcified meningioma cannot be excluded.  MRA head, per report, No LVO. Apparent symmetric high grade stenoses within the bilateral ICA cavernous segments. These apparent stenoses could potentially be exaggerated by susceptibility artifact arising from the adjacent sphenoid sinuses on the current examination. Moderate to severe focal stenosis within the proximal A1 segment of the ACA. Moderate stenosis with in the intracranial right vertebral artery.  MRA neck per report, common carotid and internal carotid arteries are patent, vertebral arteries are patent. Mild atherosclerotic narrowing at the origin of the right vertebral artery. Moderate atherosclerotic narrowing at the origin of  the left vertebral.  MI cspine with C4-5 cord compression from advanced disc degeneration with posterior endplate ridging.  Showing left foraminal impingement at the same level.  Also showing C5-6 and C6-7 ADCF with solid arthrodesis.  [facility]. EEG was obtained due to rule out seizure activity. EEG read, per report slow transients noted in both temporal areas indicate a slight neural physiological disturbance.  A CT angio head and neck were obtained on 7/24 per report No LVO, multifocal biapical ground glass opacities which may indicate pulmonary edema or infection.  She continued to be febrile to 104.45F On Saturday she was offered a spinal tap which she refused. At that she was started on ceftriaxone, Vanc, Acyclovir and ampicillin.  On 7/24 LP was obtained, per chart review results as following; clear, WBC 55, RBC 2, glucose 147, total protein 107. CSF herpes PCR and enterovirus is pending Lyme pending, Rickettsia IgG and IgM and Ehrlichia pending [facility]  she then started to having bilateral upper extremity tremors/jerking. All episodes she was awake and alert during events, just prior to events she would c/o pain in her arms.  [facility] to be [facility] and required intubation. She is on Propofol @ 35 mcg and Fentanyl drips '@200mcg'$ . Per RN patient was witnessed to have continuous bilateral upper extremity tremors. She has been febrile here as high as 103.8. Neurology consulted for assistance   ROS:  Unable to obtain due to altered mental status.   Past Medical History:  Diagnosis Date   Allergy    Atherosclerotic heart disease of  native coronary artery without angina pectoris 01/13/2020   Blood in stool    Carotid artery occlusion 08/13/2009   Carotid stenosis    Cervical lymphadenopathy 10/31/2019   CLL (chronic lymphocytic leukemia) ([place]) 41/93/7902   Complication of  anesthesia    Coronary artery calcification seen on CT scan 06/01/2020   Diabetes mellitus without complication ([place])    Diabetic peripheral neuropathy associated with type 2 diabetes mellitus ([place]) 06/08/2009   Elevated liver function tests    Essential hypertension 11/08/2019   Fatty liver 09/06/2012   Goals of care, counseling/discussion 10/28/2019   Hardening of the aorta (main artery of the heart) ([place]) 01/13/2020   History of adenomatous polyp of colon 03/16/2021   Hyperglycemia due to type 2 diabetes mellitus ([place]) 03/09/2021   Hyperlipidemia    Hypertension    Hypertriglyceridemia 03/16/2021   Long term (current) use of insulin ([place]) 03/09/2021   Lumbar radiculopathy 11/30/2021   Lymphadenopathy, abdominal 10/28/2019   Malignant lymphoma, small lymphocytic ([place]) 11/13/2019   Mixed dyslipidemia 06/01/2020   Obesity 04/19/2012   Personal history of COVID-19 11/13/2019   Pneumonia due to COVID-19 virus 11/08/2019   PONV (postoperative nausea and vomiting)    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 ([place]) 12/30/2019   T2DM (type 2 diabetes mellitus) ([place]) 11/08/2019   Thyroid nodule 03/16/2021   Type 2 diabetes mellitus with unspecified complications ([place]) 40/97/3532     Family History  Problem Relation Age of Onset   Colon cancer Neg Hx    Esophageal cancer Neg Hx    Rectal cancer Neg Hx    Stomach cancer Neg Hx    Pancreatic cancer Neg Hx      Social History:   reports that she has never smoked. She has never used smokeless tobacco. She reports that she does not drink alcohol and does not use drugs.  Medications  Current Facility-Administered Medications:    0.9 %  sodium chloride infusion, , Intravenous, Continuous, Laren Everts, RPH, Stopped at 05/24/22 0556   Place/Maintain arterial line, , , Until Discontinued **AND** 0.9 %  sodium chloride infusion, , Intra-arterial, PRN, [place] Crane M, PA-C   acetaminophen (TYLENOL) tablet 650 mg, 650 mg,  Per Tube, Q6H PRN, Candee Furbish, MD, 650 mg at 05/24/22 1219   acyclovir (ZOVIRAX) 750 mg in dextrose 5 % 250 mL IVPB, 750 mg, Intravenous, Q8H, Bryk, Veronda P, RPH, Last Rate: 265 mL/hr at 05/24/22 0600, Infusion Verify at 05/24/22 0600   ceFEPIme (MAXIPIME) 2 g in sodium chloride 0.9 % 100 mL IVPB, 2 g, Intravenous, Q8H, Comer, Okey Regal, MD   Chlorhexidine Gluconate Cloth 2 % PADS 6 each, 6 each, Topical, Q0600, Margaretha Seeds, MD, 6 each at 05/24/22 0107   Chlorhexidine Gluconate Cloth 2 % PADS 6 each, 6 each, Topical, Daily, Margaretha Seeds, MD, 6 each at 05/24/22 0912   docusate (COLACE) 50 MG/5ML liquid 100 mg, 100 mg, Per Tube, BID, Margaretha Seeds, MD, 100 mg at 05/24/22 0258   feeding supplement (PROSource TF) liquid 45 mL, 45 mL, Per Tube, BID, Candee Furbish, MD, 45 mL at 05/24/22 1002   feeding supplement (VITAL HIGH PROTEIN) liquid 1,000 mL, 1,000 mL, Per Tube, Q24H, Candee Furbish, MD, 1,000 mL at 05/24/22 1002   fentaNYL (SUBLIMAZE) bolus via infusion 25-100 mcg, 25-100 mcg, Intravenous, Q15 min PRN, Margaretha Seeds, MD   fentaNYL (SUBLIMAZE) injection 25 mcg, 25 mcg, Intravenous, Once, Margaretha Seeds, MD  fentaNYL 2534mg in NS 2554m(1023mml) infusion-PREMIX, 0-400 mcg/hr, Intravenous, Titrated, SomAnders SimmondsD, Last Rate: 15 mL/hr at 05/24/22 0600, 150 mcg/hr at 05/24/22 0600   hydrALAZINE (APRESOLINE) injection 10 mg, 10 mg, Intravenous, Q6H PRN, EllMargaretha SeedsD   insulin aspart (novoLOG) injection 0-20 Units, 0-20 Units, Subcutaneous, Q4H, SmiCandee FurbishD, 4 Units at 05/24/22 1242   insulin detemir (LEVEMIR) injection 35 Units, 35 Units, Subcutaneous, BID, SmiCandee FurbishD, 35 Units at 05/24/22 0905993norepinephrine (LEVOPHED) '4mg'$  in 250m81m.016 mg/mL) premix infusion, 0-40 mcg/min, Intravenous, Titrated, SmitCandee Furbish, Last Rate: 7.5 mL/hr at 05/24/22 1001, 2 mcg/min at 05/24/22 1001   pantoprazole sodium (PROTONIX) 40 mg/20 mL oral  suspension 40 mg, 40 mg, Per Tube, Daily, ElliMargaretha Seeds, 40 mg at 05/24/22 1002   polyethylene glycol (MIRALAX / GLYCOLAX) packet 17 g, 17 g, Per Tube, Daily, ElliMargaretha Seeds   propofol (DIPRIVAN) 1000 MG/100ML infusion, 5-80 mcg/kg/min, Intravenous, Titrated, SmitCandee Furbish, Last Rate: 9.65 mL/hr at 05/24/22 0906, 20 mcg/kg/min at 05/24/22 0906   sodium chloride flush (NS) 0.9 % injection 10-40 mL, 10-40 mL, Intracatheter, Q12H, ElliMargaretha Seeds, 10 mL at 05/24/22 0928   sodium chloride flush (NS) 0.9 % injection 10-40 mL, 10-40 mL, Intracatheter, PRN, ElliMargaretha Seeds   vancomycin (VANCOCIN) IVPB 1000 mg/200 mL premix, 1,000 mg, Intravenous, Q12H, BrykLaren EvertsH, Last Rate: 200 mL/hr at 05/24/22 1221, 1,000 mg at 05/24/22 1221   Exam: Current vital signs: BP (!) 84/56   Pulse 87   Temp (!) 101.7 F (38.7 C)   Resp (!) 26   Ht 5' 5.98" (1.676 m)   Wt 80.4 kg   SpO2 100%   BMI 28.62 kg/m  Vital signs in last 24 hours: Temp:  [98.1 F (36.7 C)-103.8 F (39.9 C)] 101.7 F (38.7 C) (07/25 1200) Pulse Rate:  [85-135] 87 (07/25 1200) Resp:  [19-34] 26 (07/25 0820) BP: (79-198)/(44-122) 84/56 (07/25 1200) SpO2:  [76 %-100 %] 100 % (07/25 1200) FiO2 (%):  [70 %-100 %] 70 % (07/25 0820) Weight:  [80.4 kg-84.4 kg] 80.4 kg (07/25 0145)  GENERAL: sedated critically ill intubated elderly female HEENT: - Normocephalic and atraumatic, dry mm, ET tube LUNGS - Clear to auscultation bilaterally with no wheezes CV - S1S2 RRR, no m/r/g, equal pulses bilaterally. ABDOMEN - Soft, nontender, nondistended with normoactive BS Ext: warm, well perfused, intact peripheral pulses, mild edema in bilateral hands  NEURO:  Mental Status: sedated on Propofol and fentanyl drip. Sedation was held during exam. Patient does not open eyes to stimuli, does not follow commands. Language: speech is Unable to assess due to ET tube Cranial Nerves: PERRL, 1mm 72materally and  sluggish, EOMI, visual fields no blink to threat bilaterally, Corneal reflex intact bilaterally, no facial asymmetry,evidence of tongue atrophy or fibrillations Motor: right upper slight horizontal movement to noxious stimuli, left upper slight finger movement to painful stimuli, bilateral lowers no response to noxious stimuli Tone: is normal and bulk is normal Sensation- slight response to noxious stimuli on uppers, lowers no response  Coordination:unable to assess  Gait- deferred    Labs I have reviewed labs in epic and the results pertinent to this consultation are:  CBC    Component Value Date/Time   WBC 9.7 05/24/2022 0303   RBC 3.44 (L) 05/24/2022 0303   HGB 9.5 (L) 05/24/2022 0558   HGB 12.9 04/05/2022 0830   HCT  28.0 (L) 05/24/2022 0558   PLT 204 05/24/2022 0303   PLT 222 04/05/2022 0830   MCV 79.1 (L) 05/24/2022 0303   MCH 25.0 (L) 05/24/2022 0303   MCHC 31.6 05/24/2022 0303   RDW 15.7 (H) 05/24/2022 0303   LYMPHSABS 0.5 (L) 05/20/2022 0918   MONOABS 0.7 05/20/2022 0918   EOSABS 0.0 05/20/2022 0918   BASOSABS 0.0 05/20/2022 0918    CMP     Component Value Date/Time   NA 137 05/24/2022 0558   NA 141 11/30/2021 1643   K 3.9 05/24/2022 0558   CL 94 (L) 05/24/2022 0303   CO2 27 05/24/2022 0303   GLUCOSE 481 (H) 05/24/2022 0303   BUN 7 (L) 05/24/2022 0303   BUN 15 11/30/2021 1643   CREATININE 0.84 05/24/2022 0303   CREATININE 0.76 04/05/2022 0830   CALCIUM 7.3 (L) 05/24/2022 0303   PROT 5.0 (L) 05/24/2022 0303   ALBUMIN 2.1 (L) 05/24/2022 0303   AST 50 (H) 05/24/2022 0303   AST 24 04/05/2022 0830   ALT 48 (H) 05/24/2022 0303   ALT 28 04/05/2022 0830   ALKPHOS 52 05/24/2022 0303   BILITOT 0.9 05/24/2022 0303   BILITOT 0.6 04/05/2022 0830   GFRNONAA >60 05/24/2022 0303   GFRNONAA >60 04/05/2022 0830   GFRAA >60 07/09/2020 0743    Lipid Panel     Component Value Date/Time   CHOL 144 04/20/2022 0925   TRIG 214 (H) 04/20/2022 0925   HDL 36 (L)  04/20/2022 0925   CHOLHDL 4.0 04/20/2022 0925   LDLCALC 72 04/20/2022 0925    rEEG 7/25: This technically difficult study is suggestive of severe diffuse encephalopathy, nonspecific etiology. No seizures or epileptiform discharges were seen throughout the recording  Assessment:  Valerie Cline is a 66 y.o. 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   She continued to be febrile to 104.37F On Saturday she was offered a spinal tap which she refused. At that she was started on ceftriaxone, Vanc, Acyclovir and ampicillin.  On 7/24 LP was obtained, per chart review results as following; clear, WBC 55, RBC 2, glucose 147, total protein 107. CSF herpes PCR and enterovirus is pending Lyme pending, Rickettsia IgG and IgM and Ehrlichia pending  She was transferred to [facility] and on arrival she was noted to be somnolent and required intubation. She is on Propofol @ 35 mcg and Fentanyl drips '@200mcg'$ . Per RN patient was witnessed to have continuous bilateral upper extremity tremors. She has been febrile here as high as 103.8.  Suspect the bilateral upper extremity that was noted is rigors from ongoing fevers and less likely seizures.  Recommendations: - Will not start any AED's at this time. Continue to monitor.  - Neurology will continue to follow   Beulah Gandy DNP, ACNPC-AG  I have seen the patient and reviewed the above note.  After pausing sedation for approximately 15 minutes, I was able to get her to open her eyes, but she did not fixate, track, engage, or follow commands.  She did lift her left arm against gravity after noxious stimulation, flexion versus withdrawal in the others.   She had a rash consistent with hand-foot-and-mouth disease recently, though no sick contacts for a month or two before  that.  Coxsackievirus can cause viral meningitis.  There were no changes  consistent with encephalitis on MRI, but I think viral meningoencephalitis is still a consideration.  Her spasms and weakness I think are likely related to her cervical spine disease.  She has severe compression of her cervical spine, and I fear that she has developed meningeal inflammation exacerbating her cervical cord compression.  She is not in any state to undergo surgery at the current time, but I did discuss her case with neurosurgery who will see her tomorrow.  In the interim, I will start steroids, given that Decadron has been shown to be helpful in some forms of meningitis I think it makes sense to use this agent to help reduce inflammation and maybe temporize her injury.   Though my suspicion is that her movements are not seizure, I do think a continuous EEG would be prudent.  I discussed this with her niece.  This patient is critically ill and at significant risk of neurological worsening, death and care requires constant monitoring of vital signs, hemodynamics,respiratory and cardiac monitoring, neurological assessment, discussion with family, other specialists and medical decision making of high complexity. I spent 35 minutes of neurocritical care time  in the care of  this patient. This was time spent independent of any time provided by nurse practitioner or PA.  Roland Rack, MD [facility] [phone]  If 7pm- 7am, please page neurology on call as listed in [place]. 05/24/2022  7:50 PM

## 2022-05-24 NOTE — Progress Notes (Signed)
Pt arrived to [place]. Primary RN Broadus John and RT Christy Sartorius in room. PCCM MD at bedside. Plan to RSI.

## 2022-05-24 NOTE — Procedures (Signed)
Bronchoscopy Procedure Note  AMANDEEP HOGSTON  [id number]  [birth year]  Date:05/24/22  Time:10:08 AM   Provider Performing:Ocia Simek C Tamala Julian   Procedure(s):  Flexible bronchoscopy with bronchial alveolar lavage [phone])  Indication(s) Immunocompromised PNA  Consent Risks of the procedure as well as the alternatives and risks of each were explained to the patient and/or caregiver.  Consent for the procedure was obtained and is signed in the bedside chart  Anesthesia In place for mechanical ventilation   Time Out Verified patient identification, verified procedure, site/side was marked, verified correct patient position, special equipment/implants available, medications/allergies/relevant history reviewed, required imaging and test results available.   Sterile Technique Usual hand hygiene, masks, gowns, and gloves were used   Procedure Description Bronchoscope advanced through endotracheal tube and into airway.  Airways were examined down to subsegmental level with findings noted below.   Following diagnostic evaluation, BAL(s) performed in [place] with normal saline and return of cloudy fluid  Findings:  - Mild airway edema - Cloudy BAL return   Complications/Tolerance None; patient tolerated the procedure well. Chest X-ray is not needed post procedure.   EBL Minimal   Specimen(s) [place] BAL

## 2022-05-24 NOTE — Consult Note (Signed)
[place] for Infectious Disease       Reason for Consult: pneumonia    Referring Physician: Dr. Tamala Julian  Principal Problem:   Meningitis Active Problems:   Severe sepsis ([place])   Acute metabolic encephalopathy   Acute respiratory failure with hypoxia (HCC)   Multifocal pneumonia   Meningioma (HCC)   Cervical myelopathy (HCC)   UTI (urinary tract infection)    Chlorhexidine Gluconate Cloth  6 each Topical Q0600   Chlorhexidine Gluconate Cloth  6 each Topical Daily   docusate  100 mg Per Tube BID   etomidate       feeding supplement (PROSource TF)  45 mL Per Tube BID   feeding supplement (VITAL HIGH PROTEIN)  1,000 mL Per Tube Q24H   fentaNYL       fentaNYL (SUBLIMAZE) injection  25 mcg Intravenous Once   insulin aspart  0-20 Units Subcutaneous Q4H   insulin detemir  35 Units Subcutaneous BID   midazolam       pantoprazole sodium  40 mg Per Tube Daily   polyethylene glycol  17 g Per Tube Daily   rocuronium bromide       sodium chloride flush  10-40 mL Intracatheter Q12H    Recommendations: Cefepime for pneumonia Will continue with vancomycin for now for possible meningitis Will continue with acyclovir for possible encephalitis  Assessment: She has respiratory failure with significant pulmonary findings concerning for pneumonia.  Differential is broad for this with her underlying immunosuppressive state on chemotherapy and BAL now done.   CSF studies noted with 55 WBCs.  This is a bit out of proportion to her current illness which mainly appears more c/w pneumonia.  Will continue with antibiotics and monitor cultures.     HPI: Valerie Cline is a 66 y.o. female with a history of lymphoma currently on chemotherapy, hypertension, hyperlipidemia, diabetes, CAD and recent hand-foot-mouth disease transferred here from [place] with concern for meningitis.  She initially presented on 7/21 with left arm tremor to the [facility] ED but left after CT before being seen. She then  went to [facility] and an MRI was equivocal for a meningioma.  She was febrile up to 102.5 and an LP was done with 55 WBCs.  She was transferred here and found to be hypoxic and CXR with a mass-like opacity and multifocal pneumonia.  She was subsequently intubated.     Review of Systems:  Unable to be assessed due to patient factors  Past Medical History:  Diagnosis Date   Allergy    Atherosclerotic heart disease of native coronary artery without angina pectoris 01/13/2020   Blood in stool    Carotid artery occlusion 08/13/2009   Carotid stenosis    Cervical lymphadenopathy 10/31/2019   CLL (chronic lymphocytic leukemia) ([place]) 20/08/711   Complication of anesthesia    Coronary artery calcification seen on CT scan 06/01/2020   Diabetes mellitus without complication ([place])    Diabetic peripheral neuropathy associated with type 2 diabetes mellitus ([place]) 06/08/2009   Elevated liver function tests    Essential hypertension 11/08/2019   Fatty liver 09/06/2012   Goals of care, counseling/discussion 10/28/2019   Hardening of the aorta (main artery of the heart) ([place]) 01/13/2020   History of adenomatous polyp of colon 03/16/2021   Hyperglycemia due to type 2 diabetes mellitus ([place]) 03/09/2021   Hyperlipidemia    Hypertension    Hypertriglyceridemia 03/16/2021   Long term (current) use of insulin ([place]) 03/09/2021   Lumbar radiculopathy 11/30/2021  Lymphadenopathy, abdominal 10/28/2019   Malignant lymphoma, small lymphocytic ([place]) 11/13/2019   Mixed dyslipidemia 06/01/2020   Obesity 04/19/2012   Personal history of COVID-19 11/13/2019   Pneumonia due to COVID-19 virus 11/08/2019   PONV (postoperative nausea and vomiting)    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 ([place]) 12/30/2019   T2DM (type 2 diabetes mellitus) ([place]) 11/08/2019   Thyroid nodule 03/16/2021   Type 2 diabetes mellitus with unspecified complications ([place]) 03/55/9741    Social History   Tobacco Use   Smoking  status: Never   Smokeless tobacco: Never  Vaping Use   Vaping Use: Never used  Substance Use Topics   Alcohol use: No   Drug use: No    Family History  Problem Relation Age of Onset   Colon cancer Neg Hx    Esophageal cancer Neg Hx    Rectal cancer Neg Hx    Stomach cancer Neg Hx    Pancreatic cancer Neg Hx     Allergies  Allergen Reactions   Canagliflozin Other (See Comments)    recurrent vaginitis   Glucophage [Metformin] Other (See Comments)    GI intolerance   Niaspan [Niacin] Other (See Comments)    flushing    Physical Exam: Constitutional: intubated, sedated  Vitals:   05/24/22 0600 05/24/22 0820  BP: (!) 98/57 (!) 106/56  Pulse: 90 94  Resp:  (!) 26  Temp: 98.1 F (36.7 C)   SpO2: 98% 98%   EYES: anicteric ENMT: +ET Cardiovascular: Tachy rr Respiratory: respiratory effort on vent GI: soft Musculoskeletal: no edema  Lab Results  Component Value Date   WBC 9.7 05/24/2022   HGB 9.5 (L) 05/24/2022   HCT 28.0 (L) 05/24/2022   MCV 79.1 (L) 05/24/2022   PLT 204 05/24/2022    Lab Results  Component Value Date   CREATININE 0.84 05/24/2022   BUN 7 (L) 05/24/2022   NA 137 05/24/2022   K 3.9 05/24/2022   CL 94 (L) 05/24/2022   CO2 27 05/24/2022    Lab Results  Component Value Date   ALT 48 (H) 05/24/2022   AST 50 (H) 05/24/2022   ALKPHOS 52 05/24/2022     Microbiology: Recent Results (from the past 240 hour(s))  MRSA Next Gen by PCR, Nasal     Status: None   Collection Time: 05/16/2022  9:42 PM   Specimen: Nasal Mucosa; Nasal Swab  Result Value Ref Range Status   MRSA by PCR Next Gen NOT DETECTED NOT DETECTED Final    Comment: (NOTE) The GeneXpert MRSA Assay (FDA approved for NASAL specimens only), is one component of a comprehensive MRSA colonization surveillance program. It is not intended to diagnose MRSA infection nor to guide or monitor treatment for MRSA infections. Test performance is not FDA approved in patients less than 68  years old. Performed at [facility], [place] [address] [place], [place] [address]     [name], [place] for Infectious Disease [facility] [email or web address] 05/24/2022, 10:15 AM

## 2022-05-24 NOTE — Consult Note (Addendum)
NAME:  Valerie Cline, MRN:  [id number], DOB:  Aug 10, 1956, LOS: 1 ADMISSION DATE:  05/18/2022, CONSULTATION DATE:  05/24/22 REFERRING MD:  Shela Leff, MD CHIEF COMPLAINT:  Altered mental status, suspected meningitis   History of Present Illness:  66 year old female with small cell lymphocytic B cell non-Hodgkin's lymphoma on Rituxan since 2020 (last dose in June 2023), HTN, HLD, DM, CAD, carotid stenosis, recent hand-foot-and mouth disease transferred from [place] to [facility].   Discharge summary reviewed: She initially presented to OSH on 05/20/22 for left upper extremity tremors. She reported recent hand foot any mouth disease contracted from her grandchild that left her weak and tired prior to this admission. Neurology work-up including MRI, EEG negative. MRI/MRA neg for acute infarct, 13 mm focus in left frontal lobe representing artifact vs small calcified meningioma, chronic small vessel ischemic changes, cerebral atrophy, high-grade stenosis in bilateral ICA, focal stenosis in left ACA and intracranial right vertebral artery. MRI of C-spine showing C4-5 cord compression from advanced disc degeneration with posterior endplate ridging.  Showing left foraminal impingement at the same level.  Also showing C5-6 and C6-7 ADCF with solid arthrodesis.  Neurologist at [facility] had recommended neurosurgical evaluation.  She developed fevers and transferred to ICU concerning for meningitis and started on Vanc, rocephin, ampicillin and acyclovir. LP was performed on 7/24 with WBC 55 RBC 2. Pending herpes, enterovirus, ehrlichia. CT A/P showed bilateral ground glass and lung opacities concerning for multifocal pneumonia.   PCCM consulted for decreased responsiveness and hypoxemia on NRB. On exam obtunded and only able to say name. O2 sats in the 70s on NRB. Transferred to the ICU for emergent intubation   Pertinent  Medical History  As above  Significant Hospital Events: Including procedures,  antibiotic start and stop dates in addition to other pertinent events   7/24 Admitted to [facility] from [place]. Transferred to PCCM when found in respiratory failure  Interim History / Subjective:  Intubated early this AM. Sedated.  Objective   Blood pressure (!) 98/57, pulse 90, temperature 98.1 F (36.7 C), resp. rate (!) 26, height 5' 5.98" (1.676 m), weight 80.4 kg, SpO2 98 %. CVP:  [0 mmHg-28 mmHg] 28 mmHg  Vent Mode: PRVC FiO2 (%):  [70 %-100 %] 70 % Set Rate:  [24 bmp-26 bmp] 26 bmp Vt Set:  [360 mL] 360 mL PEEP:  [8 cmH20] 8 cmH20 Plateau Pressure:  [18 cmH20-21 cmH20] 21 cmH20   Intake/Output Summary (Last 24 hours) at 05/24/2022 0756 Last data filed at 05/24/2022 0600 Gross per 24 hour  Intake 1363.52 ml  Output 900 ml  Net 463.52 ml   Filed Weights   05/27/2022 2115 05/24/22 0145  Weight: 84.4 kg 80.4 kg   Physical Exam: Ill appearing woman with rigors in UE > LE Opens eyes to voice, pupils equal, not following commands on vent Lungs with rhonci bilaterally, over-breathing vent Ext without edema Abdomen soft Hands/ tongue with healing vesicular rash  ABG acidemic, resp K low Hyperglycemic Lactate normal Mildly elevated LFTs OSH imaging to be reviewed shortly CXR multifocal PNA  Resolved Hospital Problem list   N/A  Assessment & Plan:   Acute hypoxemic respiratory failure secondary to multilobar pneumonia Immunocompromised Preceding Hand-Foot-Mouth disease - Lung protective tidal volumes limiting driving pressures to < 15cm H2O as able - Sedation titrated to vent compliance and patient comfort using PAD orderset - VAP prevention bundle - Daily SAT/SBT when meets institutional criteria - Bronch today for immunocompromised PNA: lingula looks  worst on [place] CT  Acute encephalopathy secondary to Question meningitis in immunocompromised patient. Ddx includes viral meningitis, encephalitis. MRI with left frontal meningoma/artifact LP 7/24: 55 WBCs with 84%  neutrophils, 2 RBCs, glucose 147, total protein 107 Upper extremity tremors vs. Rigors in setting of recurrent fevers- EEG neg, really seems like this is just symptomatic fevers but neuro to weigh in Cervical myelopathy Plan - Acyclovir/Ampicillin/Ceftriaxone/Vanc - Neuro to see - Eventual ?NSGY consult - Recheck EEG - f/u OSH LP cultures, viral panel  UTI. Positive UA at OSH Antibiotics as above  HTN PRN hydralazine  DM2 with severe hyperglycemia- on high dose insulin at home - Start levemir 35 units BID, high dose SSI  Best Practice (right click and "Reselect all SmartList Selections" daily)   Diet/type: ok to start TF DVT prophylaxis:  GI prophylaxis: PPI Lines: Central line Right femoral Foley:  Yes, and it is still needed Code Status:  full code Last date of multidisciplinary goals of care discussion - 05/24/22 Husband agreed to full code status and wanted intubation if indicated  41 min cc time independent of procedures Erskine Emery MD PCCM

## 2022-05-24 NOTE — Progress Notes (Signed)
EEG complete - results pending 

## 2022-05-24 NOTE — Progress Notes (Signed)
Asked to assist with tx to ICU. Pt transferred to [place] on 15L HFNC(salter) and NRB with [place]. Pt intubated on arrival.

## 2022-05-24 NOTE — Progress Notes (Signed)
LTM EEG hooked up and running - no initial skin breakdown - push button tested - neuro notified. [facility] monitoring.  

## 2022-05-24 NOTE — Inpatient Diabetes Management (Signed)
Inpatient Diabetes Program Recommendations  AACE/ADA: New Consensus Statement on Inpatient Glycemic Control (2015)  Target Ranges:  Prepandial:   less than 140 mg/dL      Peak postprandial:   less than 180 mg/dL (1-2 hours)      Critically ill patients:  140 - 180 mg/dL   Lab Results  Component Value Date   GLUCAP 266 (H) 05/24/2022   HGBA1C 8.0 (H) 05/24/2022    Review of Glycemic Control  Latest Reference Range & Units 05/24/22 04:14 05/24/22 04:17 05/24/22 08:43  Glucose-Capillary 70 - 99 mg/dL 489 (H) 338 (H) 266 (H)  (H): Data is abnormally high Diabetes history: Type 2 DM Outpatient Diabetes medications: U-500- 80/50/90 B/L/D Current orders for Inpatient glycemic control: Levemir 35 units BID, Novolog 0-20 units Q4H Vital '@40'$  ml/hr  Inpatient Diabetes Program Recommendations:    Consider also adding Novolog 4 units Q4H for tube feed coverage (to be stopped or held in the event tube feeds are held).   Thanks, Bronson Curb, MSN, RNC-OB Diabetes Coordinator [phone] (8a-5p)

## 2022-05-24 NOTE — Progress Notes (Signed)
eLink Physician-Brief Progress Note Patient Name: Valerie Cline DOB: 1956/10/06 MRN: [id number]   Date of Service  05/24/2022  HPI/Events of Note  Multiple issues: 1. Intubated and ventilated. Nursing request for sedation orders. Triglyceride levels in the low 200's noted in past Triglyceride levels. Will avoid Propofol. 2. Request to review CXR for ETT placement. ETT tip to my read is 3 cm above the carina. 3. Fever to 103.8 F - Nursing request for cooling blanket.   eICU Interventions  Plan: Fentanyl IV infusion. Titrate to RASS = 0 to  -1. ETT position is satisfactory. Cooling blanket PRN.      Intervention Category Major Interventions: Other:  Ashden Sonnenberg Cornelia Copa 05/24/2022, 1:52 AM

## 2022-05-24 NOTE — Procedures (Signed)
Intubation Procedure Note  Valerie Cline  [id number]  13-Oct-1956  Date:05/24/22  Time:12:41 AM   Provider Performing:Kalandra Masters Rodman Pickle, MD   Procedure: Intubation (20254)  Indication(s) Respiratory Failure  Consent Risks of the procedure as well as the alternatives and risks of each were explained to the patient and/or caregiver.  Consent for the procedure was obtained and is signed in the bedside chart (Husband)   Anesthesia Etomidate, Versed, Fentanyl, and Rocuronium   Time Out Verified patient identification, verified procedure, site/side was marked, verified correct patient position, special equipment/implants available, medications/allergies/relevant history reviewed, required imaging and test results available.   Sterile Technique Usual hand hygeine, masks, and gloves were used   Procedure Description Patient positioned in bed supine.  Sedation given as noted above.  Patient was intubated with endotracheal tube using Glidescope.  View was Grade 1 full glottis .  Number of attempts was 1.  Colorimetric CO2 detector was consistent with tracheal placement.   Complications/Tolerance None; patient tolerated the procedure well. Chest X-ray is ordered to verify placement.   EBL Minimal   Specimen(s) None

## 2022-05-24 NOTE — Plan of Care (Signed)

## 2022-05-24 NOTE — Progress Notes (Addendum)
Pharmacy Antibiotic Note  Valerie Cline is a 66 y.o. female admitted on 05/25/2022, transferred from [place] with concern for meningitis with abnormal LP.  Pharmacy has been consulted for vancomycin, acyclovir, ceftriaxone, and acyclovir dosing.  Pt has been on vanc since 7/22 (dose '1500mg'$  Q18H, last 7/24 0834, peak of 31.9 drawn after    this dose), ceftriaxone since 7/23 (dose 2g Q12H, last 7/24 1647), acyclovir since 7/24 ('820mg'$  Q8H, last 7/24 1503), and amp since 7/24 (2g Q4H, last 7/24 2006).  Plan: Vancomycin '1000mg'$  IV Q12H.  Goal trough 15-20 mcg/mL. Ceftriaxone 2g IV Q12H. Ampicillin 2g IV Q4H. Acyclovir '750mg'$  IV Q8H.  Height: 5' 5.98" (167.6 cm) Weight: 84.4 kg (186 lb 1.1 oz) IBW/kg (Calculated) : 59.26  Temp (24hrs), Avg:98.9 F (37.2 C), Min:98.4 F (36.9 C), Max:99.4 F (37.4 C)  Recent Labs  Lab 05/20/22 0918  WBC 6.2  CREATININE 0.84    Estimated Creatinine Clearance: 71.1 mL/min (by C-G formula based on SCr of 0.84 mg/dL).    Allergies  Allergen Reactions   Canagliflozin Other (See Comments)    recurrent vaginitis   Glucophage [Metformin] Other (See Comments)    GI intolerance   Niaspan [Niacin] Other (See Comments)    flushing    Thank you for allowing pharmacy to be a part of this patient's care.  Wynona Neat, PharmD, BCPS  05/24/2022 12:33 AM

## 2022-05-25 ENCOUNTER — Inpatient Hospital Stay (HOSPITAL_COMMUNITY): Payer: Medicare Other

## 2022-05-25 DIAGNOSIS — R4182 Altered mental status, unspecified: Secondary | ICD-10-CM | POA: Diagnosis not present

## 2022-05-25 DIAGNOSIS — G039 Meningitis, unspecified: Secondary | ICD-10-CM | POA: Diagnosis not present

## 2022-05-25 LAB — BASIC METABOLIC PANEL
Anion gap: 8 (ref 5–15)
BUN: 14 mg/dL (ref 8–23)
CO2: 26 mmol/L (ref 22–32)
Calcium: 8 mg/dL — ABNORMAL LOW (ref 8.9–10.3)
Chloride: 103 mmol/L (ref 98–111)
Creatinine, Ser: 0.83 mg/dL (ref 0.44–1.00)
GFR, Estimated: >60 mL/min (ref >60)
Glucose, Bld: 314 mg/dL — ABNORMAL HIGH (ref 70–99)
Potassium: 3.6 mmol/L (ref 3.5–5.1)
Sodium: 137 mmol/L (ref 135–145)

## 2022-05-25 LAB — GLUCOSE, CAPILLARY
Glucose-Capillary: 196 mg/dL — ABNORMAL HIGH (ref 70–99)
Glucose-Capillary: 292 mg/dL — ABNORMAL HIGH (ref 70–99)
Glucose-Capillary: 292 mg/dL — ABNORMAL HIGH (ref 70–99)
Glucose-Capillary: 307 mg/dL — ABNORMAL HIGH (ref 70–99)
Glucose-Capillary: 313 mg/dL — ABNORMAL HIGH (ref 70–99)
Glucose-Capillary: 319 mg/dL — ABNORMAL HIGH (ref 70–99)
Glucose-Capillary: 329 mg/dL — ABNORMAL HIGH (ref 70–99)

## 2022-05-25 LAB — POCT I-STAT 7, (LYTES, BLD GAS, ICA,H+H)
Acid-Base Excess: 0 mmol/L (ref 0.0–2.0)
Bicarbonate: 26.1 mmol/L (ref 20.0–28.0)
Calcium, Ion: 1.18 mmol/L (ref 1.15–1.40)
HCT: 29 % — ABNORMAL LOW (ref 36.0–46.0)
Hemoglobin: 9.9 g/dL — ABNORMAL LOW (ref 12.0–15.0)
O2 Saturation: 94 %
Patient temperature: 37.3
Potassium: 4.1 mmol/L (ref 3.5–5.1)
Sodium: 137 mmol/L (ref 135–145)
TCO2: 28 mmol/L (ref 22–32)
pCO2 arterial: 49.1 mmHg — ABNORMAL HIGH (ref 32–48)
pH, Arterial: 7.335 — ABNORMAL LOW (ref 7.35–7.45)
pO2, Arterial: 78 mmHg — ABNORMAL LOW (ref 83–108)

## 2022-05-25 LAB — CBC
HCT: 30.1 % — ABNORMAL LOW (ref 36.0–46.0)
Hemoglobin: 9.5 g/dL — ABNORMAL LOW (ref 12.0–15.0)
MCH: 24.9 pg — ABNORMAL LOW (ref 26.0–34.0)
MCHC: 31.6 g/dL (ref 30.0–36.0)
MCV: 79 fL — ABNORMAL LOW (ref 80.0–100.0)
Platelets: 311 10*3/uL (ref 150–400)
RBC: 3.81 MIL/uL — ABNORMAL LOW (ref 3.87–5.11)
RDW: 16 % — ABNORMAL HIGH (ref 11.5–15.5)
WBC: 15.8 10*3/uL — ABNORMAL HIGH (ref 4.0–10.5)
nRBC: 0 % (ref 0.0–0.2)

## 2022-05-25 LAB — URINE CULTURE: Culture: NO GROWTH

## 2022-05-25 LAB — HEPATIC FUNCTION PANEL
ALT: 47 U/L — ABNORMAL HIGH (ref 0–44)
AST: 53 U/L — ABNORMAL HIGH (ref 15–41)
Albumin: 1.9 g/dL — ABNORMAL LOW (ref 3.5–5.0)
Alkaline Phosphatase: 59 U/L (ref 38–126)
Bilirubin, Direct: 0.1 mg/dL (ref 0.0–0.2)
Indirect Bilirubin: 0.4 mg/dL (ref 0.3–0.9)
Total Bilirubin: 0.5 mg/dL (ref 0.3–1.2)
Total Protein: 5.2 g/dL — ABNORMAL LOW (ref 6.5–8.1)

## 2022-05-25 LAB — AMMONIA: Ammonia: 40 umol/L — ABNORMAL HIGH (ref 9–35)

## 2022-05-25 LAB — LEGIONELLA PNEUMOPHILA SEROGP 1 UR AG: L. pneumophila Serogp 1 Ur Ag: NEGATIVE

## 2022-05-25 LAB — CYTOLOGY - NON PAP

## 2022-05-25 LAB — TRIGLYCERIDES: Triglycerides: 320 mg/dL — ABNORMAL HIGH (ref <150)

## 2022-05-25 LAB — MAGNESIUM: Magnesium: 1.7 mg/dL (ref 1.7–2.4)

## 2022-05-25 LAB — PHOSPHORUS: Phosphorus: 2.5 mg/dL (ref 2.5–4.6)

## 2022-05-25 MED ORDER — MAGNESIUM SULFATE 2 GM/50ML IV SOLN
2.0000 g | Freq: Once | INTRAVENOUS | Status: AC
  Administered 2022-05-25: 2 g via INTRAVENOUS
  Filled 2022-05-25: qty 50

## 2022-05-25 MED ORDER — INSULIN DETEMIR 100 UNIT/ML ~~LOC~~ SOLN
10.0000 [IU] | Freq: Once | SUBCUTANEOUS | Status: AC
  Administered 2022-05-25: 10 [IU] via SUBCUTANEOUS
  Filled 2022-05-25 (×2): qty 0.1

## 2022-05-25 MED ORDER — INSULIN DETEMIR 100 UNIT/ML ~~LOC~~ SOLN
45.0000 [IU] | Freq: Two times a day (BID) | SUBCUTANEOUS | Status: DC
  Administered 2022-05-25: 45 [IU] via SUBCUTANEOUS
  Filled 2022-05-25 (×3): qty 0.45

## 2022-05-25 MED ORDER — POTASSIUM CHLORIDE 20 MEQ PO PACK
40.0000 meq | PACK | Freq: Once | ORAL | Status: AC
Start: 2022-05-25 — End: 2022-05-25
  Administered 2022-05-25: 40 meq
  Filled 2022-05-25: qty 2

## 2022-05-25 MED ORDER — ORAL CARE MOUTH RINSE
15.0000 mL | OROMUCOSAL | Status: DC
  Administered 2022-05-25 – 2022-06-04 (×113): 15 mL via OROMUCOSAL

## 2022-05-25 MED ORDER — INSULIN ASPART 100 UNIT/ML IJ SOLN
6.0000 [IU] | INTRAMUSCULAR | Status: DC
  Administered 2022-05-25 – 2022-05-26 (×4): 6 [IU] via SUBCUTANEOUS

## 2022-05-25 MED ORDER — ORAL CARE MOUTH RINSE
15.0000 mL | OROMUCOSAL | Status: DC | PRN
Start: 2022-05-25 — End: 2022-06-12

## 2022-05-25 MED ORDER — GADOBUTROL 1 MMOL/ML IV SOLN
8.0000 mL | Freq: Once | INTRAVENOUS | Status: AC | PRN
  Administered 2022-05-25: 8 mL via INTRAVENOUS

## 2022-05-25 MED ORDER — SODIUM CHLORIDE 0.9% FLUSH
10.0000 mL | Freq: Two times a day (BID) | INTRAVENOUS | Status: DC
  Administered 2022-05-25 – 2022-06-07 (×22): 10 mL
  Administered 2022-06-07: 40 mL
  Administered 2022-06-08 – 2022-06-18 (×19): 10 mL
  Administered 2022-06-18: 20 mL
  Administered 2022-06-19 – 2022-06-22 (×8): 10 mL
  Administered 2022-06-23: 30 mL
  Administered 2022-06-23 – 2022-06-24 (×2): 10 mL

## 2022-05-25 MED ORDER — SODIUM CHLORIDE 0.9% FLUSH: 10.0000 mL | INTRAVENOUS | Status: DC | PRN

## 2022-05-25 NOTE — Progress Notes (Signed)
[place] for Infectious Disease   Reason for visit: Follow up on possible meningoencephalitis, pneumonia  Interval History: she remains intubated, off sedation this am at the time of the exam, WBC up to 15.8 on steroids, fever curve trending down Day 3 total antibiotics  Physical Exam: Constitutional:  Vitals:   05/25/22 0900 05/25/22 1000  BP: (!) 136/105 111/70  Pulse: 97 79  Resp:    Temp: 97.7 F (36.5 C) 99.3 F (37.4 C)  SpO2: 96% 100%   Patient without response Eyes: anicteric, small pupils HENT: +ET Respiratory: respiratory effort on vent Cardiovascular: tachy rr  Review of Systems: Unable to be assessed due to patient factors  Lab Results  Component Value Date   WBC 15.8 (H) 05/25/2022   HGB 9.5 (L) 05/25/2022   HCT 30.1 (L) 05/25/2022   MCV 79.0 (L) 05/25/2022   PLT 311 05/25/2022    Lab Results  Component Value Date   CREATININE 0.83 05/25/2022   BUN 14 05/25/2022   NA 137 05/25/2022   K 3.6 05/25/2022   CL 103 05/25/2022   CO2 26 05/25/2022    Lab Results  Component Value Date   ALT 47 (H) 05/25/2022   AST 53 (H) 05/25/2022   ALKPHOS 59 05/25/2022     Microbiology: Recent Results (from the past 240 hour(s))  MRSA Next Gen by PCR, Nasal     Status: None   Collection Time: 05/20/2022  9:42 PM   Specimen: Nasal Mucosa; Nasal Swab  Result Value Ref Range Status   MRSA by PCR Next Gen NOT DETECTED NOT DETECTED Final    Comment: (NOTE) The GeneXpert MRSA Assay (FDA approved for NASAL specimens only), is one component of a comprehensive MRSA colonization surveillance program. It is not intended to diagnose MRSA infection nor to guide or monitor treatment for MRSA infections. Test performance is not FDA approved in patients less than 62 years old. Performed at [facility], [place] [address] [place], [place] [address]   Urine Culture     Status: None   Collection Time: 05/24/22  2:11 AM   Specimen: Urine, Clean Catch  Result  Value Ref Range Status   Specimen Description URINE, CLEAN CATCH  Final   Special Requests NONE  Final   Culture   Final    NO GROWTH Performed at [facility], [address] [place], [place] [address]    Report Status 05/25/2022 FINAL  Final  Culture, blood (Routine X 2) w Reflex to ID Panel     Status: None (Preliminary result)   Collection Time: 05/24/22  6:16 AM   Specimen: BLOOD RIGHT HAND  Result Value Ref Range Status   Specimen Description BLOOD RIGHT HAND  Final   Special Requests   Final    BOTTLES DRAWN AEROBIC AND ANAEROBIC Blood Culture adequate volume   Culture   Final    NO GROWTH 1 DAY Performed at [facility], [place] [address], [place], [place] [address]    Report Status PENDING  Incomplete  Culture, blood (Routine X 2) w Reflex to ID Panel     Status: None (Preliminary result)   Collection Time: 05/24/22  6:17 AM   Specimen: BLOOD LEFT HAND  Result Value Ref Range Status   Specimen Description BLOOD LEFT HAND  Final   Special Requests   Final    BOTTLES DRAWN AEROBIC AND ANAEROBIC Blood Culture adequate volume   Culture   Final  NO GROWTH 1 DAY Performed at [facility], [place] [address], [place], [place] [address]    Report Status PENDING  Incomplete  Culture, Respiratory w Gram Stain     Status: None (Preliminary result)   Collection Time: 05/24/22  9:24 AM   Specimen: Bronchoalveolar Lavage; Respiratory  Result Value Ref Range Status   Specimen Description BRONCHIAL ALVEOLAR LAVAGE  Final   Special Requests Immunocompromised  Final   Gram Stain   Final    RARE WBC PRESENT, PREDOMINANTLY MONONUCLEAR NO ORGANISMS SEEN Performed at [facility], [place] [address], [place], [place] [address]    Culture PENDING  Incomplete   Report Status PENDING  Incomplete  Respiratory (~20 pathogens) panel by PCR     Status: None   Collection Time: 05/24/22  9:24 AM   Specimen: Bronchial Alveolar Lavage; Respiratory  Result Value Ref Range  Status   Adenovirus NOT DETECTED NOT DETECTED Final   Coronavirus 229E NOT DETECTED NOT DETECTED Final    Comment: (NOTE) The Coronavirus on the Respiratory Panel, DOES NOT test for the novel  Coronavirus (2019 nCoV)    Coronavirus HKU1 NOT DETECTED NOT DETECTED Final   Coronavirus NL63 NOT DETECTED NOT DETECTED Final   Coronavirus OC43 NOT DETECTED NOT DETECTED Final   Metapneumovirus NOT DETECTED NOT DETECTED Final   Rhinovirus / Enterovirus NOT DETECTED NOT DETECTED Final   Influenza A NOT DETECTED NOT DETECTED Final   Influenza B NOT DETECTED NOT DETECTED Final   Parainfluenza Virus 1 NOT DETECTED NOT DETECTED Final   Parainfluenza Virus 2 NOT DETECTED NOT DETECTED Final   Parainfluenza Virus 3 NOT DETECTED NOT DETECTED Final   Parainfluenza Virus 4 NOT DETECTED NOT DETECTED Final   Respiratory Syncytial Virus NOT DETECTED NOT DETECTED Final   Bordetella pertussis NOT DETECTED NOT DETECTED Final   Bordetella Parapertussis NOT DETECTED NOT DETECTED Final   Chlamydophila pneumoniae NOT DETECTED NOT DETECTED Final   Mycoplasma pneumoniae NOT DETECTED NOT DETECTED Final    Comment: Performed at [facility], [place]. [address], [place], [place] [address]    Impression/Plan:  1. 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  Cultures from [place] this am remain no growth.  RMSF negative.  I agree, her recent viral illness could certainly be the cause.   Will continue with antibiotics and acyclovir, pending HSV PCR.   MRI with and without ordered.     2.  Respiratory failure - concern for pneumonia and CXR with multifocal opacities.  This would also favor a viral etiology, though something like pneumocystis also possible.  Will continue with antibiotics.  3.  Immunocomprimised state - on treatment for lymphoma with rituxan and differential of #1 broad.  Will continue to monitor results.

## 2022-05-25 NOTE — Progress Notes (Signed)
RT transported patient from [place] to MRI and back with RN. No complications. RT will continue to monitor.

## 2022-05-25 NOTE — Procedures (Addendum)
Patient Name: OPLE GIRGIS  MRN: [id number]  Epilepsy Attending: Lora Havens  Referring Physician/Provider: Greta Doom, MD   Duration: 05/24/2022 2041 to 05/25/2022 0802  Patient history: 66yo F with ams. EEG to evaluate for seizure  Level of alertness: lethargic, asleep  AEDs during EEG study: propofol  Technical aspects: This EEG study was done with scalp electrodes positioned according to the 10-20 International system of electrode placement. Electrical activity was acquired at a sampling rate of '500Hz'$  and reviewed with a high frequency filter of '70Hz'$  and a low frequency filter of '1Hz'$ . EEG data were recorded continuously and digitally stored.   Description: During awake state, no clear posterior dominant rhythm was seen. Sleep was characterized by sleep spindles (12 to 14 Hz), maximal frontocentral region. EEG showed near continuous generalized polymorphic 3 to 6 Hz theta-delta slowing admixed with 1 to 2 seconds of generalized EEG attenuation.  Event button was pressed on 05/24/2022 at 2147 and on 05/26/2022 at 0601 for unclear reasons. Concomitant EEG before, during and after the event did not show any EEG changes suggest seizure.  Hyperventilation and photic stimulation were not performed.     ABNORMALITY - Continuous slow, generalized  IMPRESSION: This study is suggestive of severe diffuse encephalopathy, nonspecific etiology but could be due to sedation.  No seizures or epileptiform discharges were seen throughout the recording.  Two events were recorded as described above without concomitant EEG change. These episodes are most likely not epileptic.   Jasmeet Gehl [name]

## 2022-05-25 NOTE — Progress Notes (Addendum)
Subjective: No significant changes  Exam: Vitals:   05/25/22 0900 05/25/22 1000  BP: (!) 136/105 111/70  Pulse: 97 79  Resp:    Temp: 97.7 F (36.5 C) 99.3 F (37.4 C)  SpO2: 96% 100%   Gen: Intubated  Neuro: MS: Does not open eyes or follow commands CN: Pupils equal round and reactive, does resist eye opening, eyes are conjugate Motor: She minimally flexes versus withdrawal in all four extremities Sensory: As above DTR: 2+ and symmetric at the knees and bicep  Pertinent Labs: Mildly elevated LFTs  Impression: 66 year old female who presented with progressive weakness and tremors and then developed profound encephalopathy with pleocytosis of 55 WBC, predominantly neutrophilic.  Given concern for meningitis causing spinal cord compromise in the setting of severe cervical stenosis, I started her on steroids with the hope of reducing the inflammation contributing to this compromise.  She has been on antibiotics including acyclovir empirically.  Recommendations: 1) continue Decadron 10 mg 4 times daily through tomorrow 2) repeat MRI brain and C-spine 3) can discontinue EEG 4) send ammonia and serum [place] 5) neurology will continue to follow  This patient is critically ill and at significant risk of neurological worsening, death and care requires constant monitoring of vital signs, hemodynamics,respiratory and cardiac monitoring, neurological assessment, discussion with family, other specialists and medical decision making of high complexity. I spent 35 minutes of neurocritical care time  in the care of  this patient. This was time spent independent of any time provided by nurse practitioner or PA.  Roland Rack, MD [facility] [phone]  If 7pm- 7am, please page neurology on call as listed in [place]. 05/25/2022  12:02 PM  If 7pm- 7am, please page neurology on call as listed in [place].

## 2022-05-25 NOTE — Progress Notes (Signed)
[facility] ADULT ICU REPLACEMENT PROTOCOL   The patient does apply for the [facility] Adult ICU Electrolyte Replacment Protocol based on the criteria listed below:   1.Exclusion criteria: TCTS patients, ECMO patients, and Dialysis patients 2. Is GFR >/= 30 ml/min? Yes.    Patient's GFR today is >60 3. Is SCr </= 2? Yes.   Patient's SCr is 0.83 mg/dL 4. Did SCr increase >/= 0.5 in 24 hours? No. 5.Pt's weight >40kg  Yes.   6. Abnormal electrolyte(s): K+ 3.6, mag 1.7  7. Electrolytes replaced per protocol 8.  Call MD STAT for K+ </= 2.5, Phos </= 1, or Mag </= 1 Physician:  n/a  Darlys Gales 05/25/2022 6:10 AM

## 2022-05-25 NOTE — Progress Notes (Signed)
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   Discharge summary reviewed: She initially presented to OSH on 05/20/22 for left upper extremity tremors. She reported recent hand foot any mouth disease contracted from her grandchild that left her weak and tired prior to this admission. Neurology work-up including MRI, EEG negative. MRI/MRA neg for acute infarct, 13 mm focus in left frontal lobe representing artifact vs small calcified meningioma, chronic small vessel ischemic changes, cerebral atrophy, high-grade stenosis in bilateral ICA, focal stenosis in left ACA and intracranial right vertebral artery. MRI of C-spine showing C4-5 cord compression from advanced disc degeneration with posterior endplate ridging.  Showing left foraminal impingement at the same level.  Also showing C5-6 and C6-7 ADCF with solid arthrodesis.  Neurologist at [facility] had recommended neurosurgical evaluation.  She developed fevers and transferred to ICU concerning for meningitis and started on Vanc, rocephin, ampicillin and acyclovir. LP was performed on 7/24 with WBC 55 RBC 2. Pending herpes, enterovirus, ehrlichia. CT A/P showed bilateral ground glass and lung opacities concerning for multifocal pneumonia.   PCCM consulted for decreased responsiveness and hypoxemia on NRB. On exam obtunded and only able to say name. O2 sats in the 70s on NRB. Transferred to the ICU for emergent intubation   Pertinent  Medical History  As above  Significant Hospital Events: Including procedures,  antibiotic start and stop dates in addition to other pertinent events   7/24 Admitted to [facility] from [place]. Transferred to PCCM when found in respiratory failure  Interim History / Subjective:  Remains intubated. Off sedation, not following commands, myoclonus in upper ext.  Objective   Blood pressure (!) 136/105, pulse 97, temperature 97.7 F (36.5 C), resp. rate (!) 30, height 5' 5.98" (1.676 m), weight 80.4 kg, SpO2 96 %.    Vent Mode: PRVC FiO2 (%):  [60 %-70 %] 60 % Set Rate:  [26 bmp-30 bmp] 30 bmp Vt Set:  [360 mL] 360 mL PEEP:  [8 cmH20-12 cmH20] 12 cmH20 Plateau Pressure:  [21 cmH20-24 cmH20] 22 cmH20   Intake/Output Summary (Last 24 hours) at 05/25/2022 0926 Last data filed at 05/25/2022 4496 Gross per 24 hour  Intake 5833.76 ml  Output 1095 ml  Net 4738.76 ml    Filed Weights   05/20/2022 2115 05/24/22 0145  Weight: 84.4 kg 80.4 kg   Physical Exam: Ill appearing woman with clonus in upper ext Lowers with clonus to dorsiflexion Disconjugate gaze with R eye forward, L pointed inward.  Pupils equal. +cough, triggers vent Cannot get to follow commands No edema Abdomen soft Hands/ tongue with healing vesicular rash  TG drifting up, will have to keep an eye on WBC up but started on steroids   Resolved Hospital Problem list   N/A  Assessment & Plan:   Acute hypoxemic respiratory failure secondary to multilobar pneumonia Immunocompromised Preceding Hand-Foot-Mouth disease - Lung protective tidal volumes limiting driving pressures to < 15cm H2O as able - Sedation titrated to vent compliance and patient comfort using PAD orderset - VAP prevention bundle - Daily SAT/SBT when meets institutional criteria: mental status  precludes extubation at present - ABG not drawn yesterday for unclear reason: will try to get drawn again - f/u BAL data  Acute encephalopathy secondary to Question meningitis in immunocompromised patient. Ddx includes viral meningitis,  encephalitis. MRI with left frontal meningoma/artifact LP 7/24: 55 WBCs with 84% neutrophils, 2 RBCs, glucose 147, total protein 107 Critical cervical myelopathy- question baseline severe DJD with meningeal swelling causing critical stensoses Plan - Acyclovir/Ampicillin/Ceftriaxone/Vanc, further changes per ID - vEEG, likely repeat MRI brain/C spine today - NSGY to see, needs better mental status to offer ACDF - f/u OSH LP cultures, viral panel  UTI. Positive UA at OSH Antibiotics as above  HTN PRN hydralazine  DM2 with severe hyperglycemia- on high dose insulin at home - Start levemir 35 units BID, high dose SSI  Best Practice (right click and "Reselect all SmartList Selections" daily)   Diet/type: ok to start TF DVT prophylaxis:  GI prophylaxis: PPI Lines: Has PICC, need to see if we can get femoral out Foley:  Yes, and it is still needed Code Status:  full code Last date of multidisciplinary goals of care discussion - 05/24/22 Husband agreed to full code status and wanted intubation if indicated  51 min cc time independent of procedures Erskine Emery MD PCCM

## 2022-05-25 NOTE — Progress Notes (Signed)
LTM EEG discontinued - no skin breakdown at unhook.   

## 2022-05-26 DIAGNOSIS — G039 Meningitis, unspecified: Secondary | ICD-10-CM | POA: Diagnosis not present

## 2022-05-26 LAB — BASIC METABOLIC PANEL
Anion gap: 6 (ref 5–15)
BUN: 20 mg/dL (ref 8–23)
CO2: 27 mmol/L (ref 22–32)
Calcium: 8.5 mg/dL — ABNORMAL LOW (ref 8.9–10.3)
Chloride: 104 mmol/L (ref 98–111)
Creatinine, Ser: 0.8 mg/dL (ref 0.44–1.00)
GFR, Estimated: >60 mL/min (ref >60)
Glucose, Bld: 243 mg/dL — ABNORMAL HIGH (ref 70–99)
Potassium: 3.8 mmol/L (ref 3.5–5.1)
Sodium: 137 mmol/L (ref 135–145)

## 2022-05-26 LAB — CULTURE, RESPIRATORY W GRAM STAIN: Culture: NO GROWTH

## 2022-05-26 LAB — GLUCOSE, CAPILLARY
Glucose-Capillary: 222 mg/dL — ABNORMAL HIGH (ref 70–99)
Glucose-Capillary: 225 mg/dL — ABNORMAL HIGH (ref 70–99)
Glucose-Capillary: 226 mg/dL — ABNORMAL HIGH (ref 70–99)
Glucose-Capillary: 288 mg/dL — ABNORMAL HIGH (ref 70–99)
Glucose-Capillary: 291 mg/dL — ABNORMAL HIGH (ref 70–99)
Glucose-Capillary: 297 mg/dL — ABNORMAL HIGH (ref 70–99)

## 2022-05-26 LAB — CBC
HCT: 28.5 % — ABNORMAL LOW (ref 36.0–46.0)
Hemoglobin: 9.1 g/dL — ABNORMAL LOW (ref 12.0–15.0)
MCH: 24.8 pg — ABNORMAL LOW (ref 26.0–34.0)
MCHC: 31.9 g/dL (ref 30.0–36.0)
MCV: 77.7 fL — ABNORMAL LOW (ref 80.0–100.0)
Platelets: 329 10*3/uL (ref 150–400)
RBC: 3.67 MIL/uL — ABNORMAL LOW (ref 3.87–5.11)
RDW: 16.2 % — ABNORMAL HIGH (ref 11.5–15.5)
WBC: 14 10*3/uL — ABNORMAL HIGH (ref 4.0–10.5)
nRBC: 0 % (ref 0.0–0.2)

## 2022-05-26 LAB — MAGNESIUM: Magnesium: 2 mg/dL (ref 1.7–2.4)

## 2022-05-26 LAB — ACID FAST SMEAR (AFB, MYCOBACTERIA): Acid Fast Smear: NEGATIVE

## 2022-05-26 LAB — PHOSPHORUS: Phosphorus: 2.2 mg/dL — ABNORMAL LOW (ref 2.5–4.6)

## 2022-05-26 MED ORDER — INSULIN ASPART 100 UNIT/ML IJ SOLN
10.0000 [IU] | INTRAMUSCULAR | Status: DC
  Administered 2022-05-26 – 2022-05-27 (×6): 10 [IU] via SUBCUTANEOUS

## 2022-05-26 MED ORDER — MIDAZOLAM HCL 2 MG/2ML IJ SOLN
1.0000 mg | INTRAMUSCULAR | Status: DC | PRN
  Administered 2022-05-26 (×2): 1 mg via INTRAVENOUS
  Administered 2022-05-27: 2 mg via INTRAVENOUS
  Administered 2022-05-27: 1 mg via INTRAVENOUS
  Administered 2022-05-28: 2 mg via INTRAVENOUS
  Filled 2022-05-26 (×5): qty 2

## 2022-05-26 MED ORDER — SODIUM CHLORIDE 0.9 % IV SOLN
2.0000 g | INTRAVENOUS | Status: DC
  Administered 2022-05-26: 2 g via INTRAVENOUS
  Filled 2022-05-26 (×2): qty 20

## 2022-05-26 MED ORDER — POTASSIUM PHOSPHATES 15 MMOLE/5ML IV SOLN
15.0000 mmol | Freq: Once | INTRAVENOUS | Status: AC
  Administered 2022-05-26: 15 mmol via INTRAVENOUS
  Filled 2022-05-26: qty 5

## 2022-05-26 MED ORDER — INSULIN DETEMIR 100 UNIT/ML ~~LOC~~ SOLN
55.0000 [IU] | Freq: Two times a day (BID) | SUBCUTANEOUS | Status: DC
  Administered 2022-05-26 (×2): 55 [IU] via SUBCUTANEOUS
  Filled 2022-05-26 (×4): qty 0.55

## 2022-05-26 NOTE — Progress Notes (Signed)
Subjective: No significant changes  Exam: Vitals:   05/26/22 1000 05/26/22 1100  BP: (!) 157/65 (!) 115/54  Pulse: 74 64  Resp:    Temp:    SpO2: 99% 98%   Gen: Intubated  Neuro: MS: Does not open eyes or follow commands CN: Pupils equal round and reactive, does resist eye opening, eyes are conjugate Motor: She minimally flexes versus withdrawal in all four extremities Sensory: As above DTR: 2+ and symmetric at the knees and bicep  Pertinent Labs: Mildly elevated LFTs  Impression: 66 year old female who presented with progressive weakness and tremors and then developed profound encephalopathy with pleocytosis of 55 WBC, predominantly neutrophilic.  Given concern for meningitis causing spinal cord compromise in the setting of severe cervical stenosis, I started her on steroids with the hope of reducing the inflammation contributing to this compromise, but on repeat MRI, it does not look as severe. With layering  protenaceous material on MRI, do feel this is infectious and given recent hand-foot-mouth think that coxsackie is most likely culprit.    Recommendations: 1) decadron d/c'ed yesterday 2) f/u CSF results.  3) continue supportive care.    Roland Rack, MD [facility] [phone]  If 7pm- 7am, please page neurology on call as listed in [place]. 05/26/2022  11:24 AM  If 7pm- 7am, please page neurology on call as listed in [place].

## 2022-05-26 NOTE — Progress Notes (Signed)
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   Discharge summary reviewed: She initially presented to OSH on 05/20/22 for left upper extremity tremors. She reported recent hand foot any mouth disease contracted from her grandchild that left her weak and tired prior to this admission. Neurology work-up including MRI, EEG negative. MRI/MRA neg for acute infarct, 13 mm focus in left frontal lobe representing artifact vs small calcified meningioma, chronic small vessel ischemic changes, cerebral atrophy, high-grade stenosis in bilateral ICA, focal stenosis in left ACA and intracranial right vertebral artery. MRI of C-spine showing C4-5 cord compression from advanced disc degeneration with posterior endplate ridging.  Showing left foraminal impingement at the same level.  Also showing C5-6 and C6-7 ADCF with solid arthrodesis.  Neurologist at [facility] had recommended neurosurgical evaluation.  She developed fevers and transferred to ICU concerning for meningitis and started on Vanc, rocephin, ampicillin and acyclovir. LP was performed on 7/24 with WBC 55 RBC 2. Pending herpes, enterovirus, ehrlichia. CT A/P showed bilateral ground glass and lung opacities concerning for multifocal pneumonia.   PCCM consulted for decreased responsiveness and hypoxemia on NRB. On exam obtunded and only able to say name. O2 sats in the 70s on NRB. Transferred to the ICU for emergent intubation   Pertinent  Medical History  As above  Significant Hospital Events: Including procedures,  antibiotic start and stop dates in addition to other pertinent events   7/24 Admitted to [facility] from [place]. Transferred to PCCM when found in respiratory failure  Interim History / Subjective:  Neuro status unchanged. Some desats with turning.  Objective   Blood pressure (!) 157/65, pulse 74, temperature 99 F (37.2 C), resp. rate (!) 30, height 5' 5.98" (1.676 m), weight 90.5 kg, SpO2 99 %.    Vent Mode: PRVC FiO2 (%):  [50 %-90 %] 80 % Set Rate:  [30 bmp] 30 bmp Vt Set:  [360 mL] 360 mL PEEP:  [12 cmH20] 12 cmH20 Plateau Pressure:  [18 cmH20-22 cmH20] 20 cmH20   Intake/Output Summary (Last 24 hours) at 05/26/2022 1016 Last data filed at 05/26/2022 1000 Gross per 24 hour  Intake 4339.06 ml  Output 1675 ml  Net 2664.06 ml    Filed Weights   05/16/2022 2115 05/24/22 0145 05/26/22 0400  Weight: 84.4 kg 80.4 kg 90.5 kg   Physical Exam: Eyes now midline, small pupils equal Trachea midline Lungs  No edema Abdomen soft Hands/ tongue with healing vesicular rash stable  TG drifting up, will have to keep an eye on WBC up but started on steroids   Resolved Hospital Problem list   N/A  Assessment & Plan:   Acute hypoxemic respiratory failure secondary to multilobar pneumonia - Lung protective tidal volumes limiting driving pressures to < 15cm H2O as able - Sedation titrated to vent compliance and patient comfort using PAD orderset - VAP prevention bundle - Daily SAT/SBT when meets institutional criteria: mental status precludes extubation at present - f/u BAL data  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  HSV OSH neg, culture neg, arbovirus panel  penidng. Critical cervical myelopathy- question baseline severe DJD with meningeal swelling threatening critical stenosis Plan - Supportive care, antimicrobials per ID - Steroids have been DC'd  UTI. Positive UA at OSH Antibiotics as  above  HTN PRN hydralazine  DM2 with severe hyperglycemia- on high dose insulin at home - Increase levemir, continue SSI, goal 140-180  Best Practice (right click and "Reselect all SmartList Selections" daily)   Diet/type: ok to start TF DVT prophylaxis:  GI prophylaxis: PPI Lines: Has PICC, need to see if we can get femoral out Foley:  Yes, and it is still needed Code Status:  full code Last date of multidisciplinary goals of care discussion - 05/24/22 Husband agreed to full code status and wanted intubation if indicated  41 min cc time independent of procedures Erskine Emery MD PCCM

## 2022-05-26 NOTE — Progress Notes (Signed)
Pt. unresponsive to painful stimuli during 0000 assessment, despite several decreases in propofol dose. Propofol and fentanyl turned off. Pt. opened eyes in response to painful stimuli, gaze deviated up and to the left. Pt. exhibiting tremors/seizure like activity. Elink contacted and RN (Gretchin) notified. Propofol and fentanyl resumed (see MAR).

## 2022-05-26 NOTE — Progress Notes (Signed)
[facility] for Infectious Disease   Reason for visit: Follow up on suspected meningitis  Interval History: no response; two family members at bedside; fever curve trending down Day 6 total antibiotics (including [place])  Physical Exam: Constitutional:  Vitals:   05/26/22 0900 05/26/22 1000  BP: (!) 123/56 (!) 157/65  Pulse: 69 74  Resp:    Temp: 99 F (37.2 C)   SpO2: 97% 99%  Does not respond Eyes: anicteric HENT: +ET Respiratory: respiratory effort on vent  Review of Systems: Unable to be assessed due to patient factors  Lab Results  Component Value Date   WBC 14.0 (H) 05/26/2022   HGB 9.1 (L) 05/26/2022   HCT 28.5 (L) 05/26/2022   MCV 77.7 (L) 05/26/2022   PLT 329 05/26/2022    Lab Results  Component Value Date   CREATININE 0.80 05/26/2022   BUN 20 05/26/2022   NA 137 05/26/2022   K 3.8 05/26/2022   CL 104 05/26/2022   CO2 27 05/26/2022    Lab Results  Component Value Date   ALT 47 (H) 05/25/2022   AST 53 (H) 05/25/2022   ALKPHOS 59 05/25/2022     Microbiology: Recent Results (from the past 240 hour(s))  MRSA Next Gen by PCR, Nasal     Status: None   Collection Time: 05/01/2022  9:42 PM   Specimen: Nasal Mucosa; Nasal Swab  Result Value Ref Range Status   MRSA by PCR Next Gen NOT DETECTED NOT DETECTED Final    Comment: (NOTE) The GeneXpert MRSA Assay (FDA approved for NASAL specimens only), is one component of a comprehensive MRSA colonization surveillance program. It is not intended to diagnose MRSA infection nor to guide or monitor treatment for MRSA infections. Test performance is not FDA approved in patients less than 84 years old. Performed at [facility], [place] [address] [place], [place] [address]   Urine Culture     Status: None   Collection Time: 05/24/22  2:11 AM   Specimen: Urine, Clean Catch  Result Value Ref Range Status   Specimen Description URINE, CLEAN CATCH  Final   Special Requests NONE  Final   Culture    Final    NO GROWTH Performed at [facility], [address] [place], [place] [address]    Report Status 05/25/2022 FINAL  Final  Culture, blood (Routine X 2) w Reflex to ID Panel     Status: None (Preliminary result)   Collection Time: 05/24/22  6:16 AM   Specimen: BLOOD RIGHT HAND  Result Value Ref Range Status   Specimen Description BLOOD RIGHT HAND  Final   Special Requests   Final    BOTTLES DRAWN AEROBIC AND ANAEROBIC Blood Culture adequate volume   Culture   Final    NO GROWTH 2 DAYS Performed at [facility], [place] [address] [place], [place] [address]    Report Status PENDING  Incomplete  Culture, blood (Routine X 2) w Reflex to ID Panel     Status: None (Preliminary result)   Collection Time: 05/24/22  6:17 AM   Specimen: BLOOD LEFT HAND  Result Value Ref Range Status   Specimen Description BLOOD LEFT HAND  Final   Special Requests   Final    BOTTLES DRAWN AEROBIC AND ANAEROBIC Blood Culture adequate volume   Culture   Final    NO GROWTH 2 DAYS Performed at [facility], [place] [address] [place], [place] [address]  Report Status PENDING  Incomplete  Culture, Respiratory w Gram Stain     Status: None (Preliminary result)   Collection Time: 05/24/22  9:24 AM   Specimen: Bronchoalveolar Lavage; Respiratory  Result Value Ref Range Status   Specimen Description BRONCHIAL ALVEOLAR LAVAGE  Final   Special Requests Immunocompromised  Final   Gram Stain   Final    RARE WBC PRESENT, PREDOMINANTLY MONONUCLEAR NO ORGANISMS SEEN    Culture   Final    NO GROWTH 1 DAY Performed at [facility], [address] [place], [place] [address]    Report Status PENDING  Incomplete  Acid Fast Smear (AFB)     Status: None   Collection Time: 05/24/22  9:24 AM   Specimen: Bronchial Alveolar Lavage  Result Value Ref Range Status   AFB Specimen Processing Concentration  Final   Acid Fast Smear Negative  Final    Comment: (NOTE) Performed At: [facility] [place], [place] [id number] Rush Farmer MD UQ:[id number]    Source (AFB) BRONCHIAL ALVEOLAR LAVAGE  Final    Comment: Performed at [facility], [place] [address] [place], [place] [address]  Respiratory (~20 pathogens) panel by PCR     Status: None   Collection Time: 05/24/22  9:24 AM   Specimen: Bronchial Alveolar Lavage; Respiratory  Result Value Ref Range Status   Adenovirus NOT DETECTED NOT DETECTED Final   Coronavirus 229E NOT DETECTED NOT DETECTED Final    Comment: (NOTE) The Coronavirus on the Respiratory Panel, DOES NOT test for the novel  Coronavirus (2019 nCoV)    Coronavirus HKU1 NOT DETECTED NOT DETECTED Final   Coronavirus NL63 NOT DETECTED NOT DETECTED Final   Coronavirus OC43 NOT DETECTED NOT DETECTED Final   Metapneumovirus NOT DETECTED NOT DETECTED Final   Rhinovirus / Enterovirus NOT DETECTED NOT DETECTED Final   Influenza A NOT DETECTED NOT DETECTED Final   Influenza B NOT DETECTED NOT DETECTED Final   Parainfluenza Virus 1 NOT DETECTED NOT DETECTED Final   Parainfluenza Virus 2 NOT DETECTED NOT DETECTED Final   Parainfluenza Virus 3 NOT DETECTED NOT DETECTED Final   Parainfluenza Virus 4 NOT DETECTED NOT DETECTED Final   Respiratory Syncytial Virus NOT DETECTED NOT DETECTED Final   Bordetella pertussis NOT DETECTED NOT DETECTED Final   Bordetella Parapertussis NOT DETECTED NOT DETECTED Final   Chlamydophila pneumoniae NOT DETECTED NOT DETECTED Final   Mycoplasma pneumoniae NOT DETECTED NOT DETECTED Final    Comment: Performed at [facility], [place]. [address] [place], [place] [address]    Impression/Plan:  1. 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  No positive findings to date including a negative HSV PCR now.  Arboviral panel has been added.  No antibiotic options for this.     Continue supportive care.    2.  Acute hypoxemic respiratory failure - +  multilobar pneumonia.  Continues on vent.  Poor mental status so extubation difficult at this time.   Continue with antibiotics - agree with change to ceftriaxone MRSA negative so will stop vancomycin  3.  Isolation - on droplet precautions with history of HFM infection.

## 2022-05-27 ENCOUNTER — Inpatient Hospital Stay (HOSPITAL_COMMUNITY): Payer: Medicare Other

## 2022-05-27 DIAGNOSIS — G039 Meningitis, unspecified: Secondary | ICD-10-CM | POA: Diagnosis not present

## 2022-05-27 LAB — BASIC METABOLIC PANEL
Anion gap: 7 (ref 5–15)
Anion gap: 11 (ref 5–15)
BUN: 24 mg/dL — ABNORMAL HIGH (ref 8–23)
BUN: 29 mg/dL — ABNORMAL HIGH (ref 8–23)
CO2: 27 mmol/L (ref 22–32)
CO2: 29 mmol/L (ref 22–32)
Calcium: 8 mg/dL — ABNORMAL LOW (ref 8.9–10.3)
Calcium: 8.2 mg/dL — ABNORMAL LOW (ref 8.9–10.3)
Chloride: 106 mmol/L (ref 98–111)
Chloride: 106 mmol/L (ref 98–111)
Creatinine, Ser: 0.81 mg/dL (ref 0.44–1.00)
Creatinine, Ser: 0.91 mg/dL (ref 0.44–1.00)
GFR, Estimated: >60 mL/min (ref >60)
GFR, Estimated: >60 mL/min (ref >60)
Glucose, Bld: 247 mg/dL — ABNORMAL HIGH (ref 70–99)
Glucose, Bld: 258 mg/dL — ABNORMAL HIGH (ref 70–99)
Potassium: 3.7 mmol/L (ref 3.5–5.1)
Potassium: 3.6 mmol/L (ref 3.5–5.1)
Sodium: 140 mmol/L (ref 135–145)
Sodium: 146 mmol/L — ABNORMAL HIGH (ref 135–145)

## 2022-05-27 LAB — GLUCOSE, CAPILLARY
Glucose-Capillary: 224 mg/dL — ABNORMAL HIGH (ref 70–99)
Glucose-Capillary: 219 mg/dL — ABNORMAL HIGH (ref 70–99)
Glucose-Capillary: 359 mg/dL — ABNORMAL HIGH (ref 70–99)
Glucose-Capillary: 233 mg/dL — ABNORMAL HIGH (ref 70–99)
Glucose-Capillary: 241 mg/dL — ABNORMAL HIGH (ref 70–99)

## 2022-05-27 LAB — MAGNESIUM
Magnesium: 1.8 mg/dL (ref 1.7–2.4)
Magnesium: 1.9 mg/dL (ref 1.7–2.4)

## 2022-05-27 LAB — CBC
HCT: 26.6 % — ABNORMAL LOW (ref 36.0–46.0)
HCT: 30.5 % — ABNORMAL LOW (ref 36.0–46.0)
Hemoglobin: 8.4 g/dL — ABNORMAL LOW (ref 12.0–15.0)
Hemoglobin: 9.5 g/dL — ABNORMAL LOW (ref 12.0–15.0)
MCH: 24.9 pg — ABNORMAL LOW (ref 26.0–34.0)
MCH: 24.4 pg — ABNORMAL LOW (ref 26.0–34.0)
MCHC: 31.6 g/dL (ref 30.0–36.0)
MCHC: 31.1 g/dL (ref 30.0–36.0)
MCV: 78.7 fL — ABNORMAL LOW (ref 80.0–100.0)
MCV: 78.4 fL — ABNORMAL LOW (ref 80.0–100.0)
Platelets: 279 10*3/uL (ref 150–400)
Platelets: 280 10*3/uL (ref 150–400)
RBC: 3.38 MIL/uL — ABNORMAL LOW (ref 3.87–5.11)
RBC: 3.89 MIL/uL (ref 3.87–5.11)
RDW: 16.5 % — ABNORMAL HIGH (ref 11.5–15.5)
RDW: 16.7 % — ABNORMAL HIGH (ref 11.5–15.5)
WBC: 10.9 10*3/uL — ABNORMAL HIGH (ref 4.0–10.5)
WBC: 8.5 10*3/uL (ref 4.0–10.5)
nRBC: 0.3 % — ABNORMAL HIGH (ref 0.0–0.2)
nRBC: 0.2 % (ref 0.0–0.2)

## 2022-05-27 LAB — PROCALCITONIN: Procalcitonin: 0.8 ng/mL

## 2022-05-27 LAB — PHOSPHORUS: Phosphorus: 2.7 mg/dL (ref 2.5–4.6)

## 2022-05-27 LAB — SURGICAL PATHOLOGY

## 2022-05-27 MED ORDER — FENTANYL CITRATE PF 50 MCG/ML IJ SOSY
25.0000 ug | PREFILLED_SYRINGE | INTRAMUSCULAR | Status: DC | PRN
  Administered 2022-05-27 (×3): 50 ug via INTRAVENOUS
  Administered 2022-05-27: 100 ug via INTRAVENOUS
  Filled 2022-05-27 (×2): qty 1

## 2022-05-27 MED ORDER — ATROPINE SULFATE 1 MG/10ML IJ SOSY
PREFILLED_SYRINGE | INTRAMUSCULAR | Status: AC
  Filled 2022-05-27: qty 10

## 2022-05-27 MED ORDER — METRONIDAZOLE 500 MG PO TABS
500.0000 mg | ORAL_TABLET | Freq: Two times a day (BID) | ORAL | Status: DC
  Filled 2022-05-27: qty 1

## 2022-05-27 MED ORDER — LEVETIRACETAM IN NACL 1000 MG/100ML IV SOLN
1000.0000 mg | Freq: Two times a day (BID) | INTRAVENOUS | Status: DC
  Administered 2022-05-27 – 2022-05-29 (×5): 1000 mg via INTRAVENOUS
  Filled 2022-05-27 (×5): qty 100

## 2022-05-27 MED ORDER — MAGNESIUM SULFATE 2 GM/50ML IV SOLN
2.0000 g | Freq: Once | INTRAVENOUS | Status: AC
  Administered 2022-05-27: 2 g via INTRAVENOUS
  Filled 2022-05-27: qty 50

## 2022-05-27 MED ORDER — INSULIN DETEMIR 100 UNIT/ML ~~LOC~~ SOLN
65.0000 [IU] | Freq: Two times a day (BID) | SUBCUTANEOUS | Status: DC
  Administered 2022-05-27 (×2): 65 [IU] via SUBCUTANEOUS
  Filled 2022-05-27 (×4): qty 0.65

## 2022-05-27 MED ORDER — FENTANYL CITRATE PF 50 MCG/ML IJ SOSY: 25.0000 ug | PREFILLED_SYRINGE | INTRAMUSCULAR | Status: DC | PRN

## 2022-05-27 MED ORDER — FUROSEMIDE 10 MG/ML IJ SOLN
40.0000 mg | Freq: Three times a day (TID) | INTRAMUSCULAR | Status: AC
  Administered 2022-05-27 (×2): 40 mg via INTRAVENOUS
  Filled 2022-05-27 (×2): qty 4

## 2022-05-27 MED ORDER — DEXMEDETOMIDINE HCL IN NACL 400 MCG/100ML IV SOLN
INTRAVENOUS | Status: AC
  Filled 2022-05-27: qty 100

## 2022-05-27 MED ORDER — IPRATROPIUM-ALBUTEROL 0.5-2.5 (3) MG/3ML IN SOLN
3.0000 mL | Freq: Four times a day (QID) | RESPIRATORY_TRACT | Status: DC
  Administered 2022-05-27 – 2022-05-29 (×9): 3 mL via RESPIRATORY_TRACT
  Filled 2022-05-27 (×9): qty 3

## 2022-05-27 MED ORDER — SODIUM CHLORIDE 0.9 % IV SOLN
1.0000 g | Freq: Three times a day (TID) | INTRAVENOUS | Status: DC
  Administered 2022-05-27 – 2022-05-31 (×12): 1 g via INTRAVENOUS
  Filled 2022-05-27 (×15): qty 20

## 2022-05-27 MED ORDER — ALBUTEROL SULFATE (2.5 MG/3ML) 0.083% IN NEBU
INHALATION_SOLUTION | RESPIRATORY_TRACT | Status: AC
  Administered 2022-05-27: 2.5 mg
  Filled 2022-05-27: qty 3

## 2022-05-27 MED ORDER — POTASSIUM CHLORIDE 20 MEQ PO PACK
40.0000 meq | PACK | Freq: Two times a day (BID) | ORAL | Status: AC
  Administered 2022-05-27 (×2): 40 meq
  Filled 2022-05-27 (×2): qty 2

## 2022-05-27 MED ORDER — POTASSIUM CHLORIDE CRYS ER 20 MEQ PO TBCR: 40.0000 meq | EXTENDED_RELEASE_TABLET | Freq: Two times a day (BID) | ORAL | Status: DC

## 2022-05-27 MED ORDER — FENTANYL 2500MCG IN NS 250ML (10MCG/ML) PREMIX INFUSION: 0.0000 ug/h | INTRAVENOUS | Status: DC

## 2022-05-27 MED ORDER — INSULIN ASPART 100 UNIT/ML IJ SOLN
15.0000 [IU] | INTRAMUSCULAR | Status: DC
Start: 2022-05-27 — End: 2022-05-28
  Administered 2022-05-27 – 2022-05-28 (×5): 15 [IU] via SUBCUTANEOUS

## 2022-05-27 MED ORDER — DEXMEDETOMIDINE HCL IN NACL 400 MCG/100ML IV SOLN
0.4000 ug/kg/h | INTRAVENOUS | Status: DC
  Administered 2022-05-27 (×2): 1.2 ug/kg/h via INTRAVENOUS
  Administered 2022-05-27: 0.4 ug/kg/h via INTRAVENOUS
  Administered 2022-05-27: 1.2 ug/kg/h via INTRAVENOUS
  Filled 2022-05-27 (×3): qty 100

## 2022-05-27 MED ORDER — FENTANYL 2500MCG IN NS 250ML (10MCG/ML) PREMIX INFUSION
0.0000 ug/h | INTRAVENOUS | Status: DC
  Administered 2022-05-27: 25 ug/h via INTRAVENOUS
  Administered 2022-05-28 – 2022-05-29 (×5): 300 ug/h via INTRAVENOUS
  Administered 2022-05-30: 275 ug/h via INTRAVENOUS
  Administered 2022-05-30 – 2022-05-31 (×2): 250 ug/h via INTRAVENOUS
  Administered 2022-06-01 – 2022-06-03 (×3): 75 ug/h via INTRAVENOUS
  Filled 2022-05-27 (×12): qty 250

## 2022-05-27 NOTE — Progress Notes (Signed)
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   Discharge summary reviewed: She initially presented to OSH on 05/20/22 for left upper extremity tremors. She reported recent hand foot any mouth disease contracted from her grandchild that left her weak and tired prior to this admission. Neurology work-up including MRI, EEG negative. MRI/MRA neg for acute infarct, 13 mm focus in left frontal lobe representing artifact vs small calcified meningioma, chronic small vessel ischemic changes, cerebral atrophy, high-grade stenosis in bilateral ICA, focal stenosis in left ACA and intracranial right vertebral artery. MRI of C-spine showing C4-5 cord compression from advanced disc degeneration with posterior endplate ridging.  Showing left foraminal impingement at the same level.  Also showing C5-6 and C6-7 ADCF with solid arthrodesis.  Neurologist at [facility] had recommended neurosurgical evaluation.  She developed fevers and transferred to ICU concerning for meningitis and started on Vanc, rocephin, ampicillin and acyclovir. LP was performed on 7/24 with WBC 55 RBC 2. Pending herpes, enterovirus, ehrlichia. CT A/P showed bilateral ground glass and lung opacities concerning for multifocal pneumonia.   PCCM consulted for decreased responsiveness and hypoxemia on NRB. On exam obtunded and only able to say name. O2 sats in the 70s on NRB. Transferred to the ICU for emergent intubation   Pertinent  Medical History  As above  Significant Hospital Events: Including procedures,  antibiotic start and stop dates in addition to other pertinent events   7/24 Admitted to [facility] from [place]. Transferred to PCCM when found in respiratory failure 7/25 intubated, bronched, art line placed, PICC placed.  Neuro and ID consulted. Started decadron  7/26 decadron dc/d 7/27 felt most consistent with viral meningoencephalitis   Interim History / Subjective:  Retaining urine overnight, got I/O  K 3.7 Mag 1.8    Objective   Blood pressure (!) 201/84, pulse (!) 112, temperature 99.9 F (37.7 C), temperature source Axillary, resp. rate (!) 28, height 5' 5.98" (1.676 m), weight 90.9 kg, SpO2 90 %.    Vent Mode: PRVC FiO2 (%):  [60 %-90 %] 90 % Set Rate:  [20 bmp-30 bmp] 20 bmp Vt Set:  [360 mL] 360 mL PEEP:  [10 cmH20-12 cmH20] 10 cmH20 Pressure Support:  [5 cmH20] 5 cmH20 Plateau Pressure:  [23 cmH20-26 cmH20] 26 cmH20   Intake/Output Summary (Last 24 hours) at 05/27/2022 0842 Last data filed at 05/27/2022 0800 Gross per 24 hour  Intake 2261.49 ml  Output 1120 ml  Net 1141.49 ml   Filed Weights   05/24/22 0145 05/26/22 0400 05/27/22 0458  Weight: 80.4 kg 90.5 kg 90.9 kg   Physical Exam: General: Critically ill appearing adult F intubated  Neuro: open eyes to stimulation. L deviated gaze while stimulated, midline when not stimulated. Not following commands. Withdraws to pain BLE > BUE. Tremor  HEENT: NCAT pink mm ETT secure  Pulm: incr RR. Symmetrical chest expansion. Mechanically ventilated CV: rr cap refill brisk.  GI: soft ndnt GU: no foley  MSK: no acute joint deformity, no cyanosis BUE or clubbing BUE Skin:  healing vesicular rash hands    Resolved Hospital Problem  list   N/A  Assessment & Plan:   Acute encephalopathy in setting of viral meningoencephalitis  P -below for meningoencephalitis -dc fent gtt -add dex gtt for RASS 0, PRN fent PRN versed   Acute hypoxemic respiratory failure 2/2 multilobar PNA  CXR 7/28 personally reviewed, worse.  P -will  try her on PSV -- having some dyssynchrony with PRVC. If PSV isnt tolerable then could try pressure control  -will diurese & give K, mag  -cont abx -- rocephin -recheck trach asp -cont to hold steroids (was on decadron previously for neuro, dc 7/26. 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  HSV OSH neg, culture neg, arbovirus panel penidng. Critical cervical myopathy - question baseline severe DJD with meningeal swelling threatening critical stenosis P - cont droplet  - appreciate ID and neuro following  -arboviral panel added  -anticipate that recovery from viral process could take weeks   UTI - Positive UA at OSH P -rocephin   HTN -PRN hydral   DM2 with severe hyperglycemia  -on high dose insulin at home P -incr levemir again -- 65 units BID -incr q4 [place] novolog again -- 15 units q4hr -cont rSSI   Small lymphocytic B cell non-hodgkin lymphoma -on maintenance rituximab (last received April 05 2022)   Urinary retention -replace foley 7/28  Best Practice (right click and "Reselect all SmartList Selections" daily)   Diet/type: EN  DVT prophylaxis: SCDs GI prophylaxis: PPI Lines: PICC  Foley:  Yes, and it is still needed -- replace 7/28  Code Status:  full code Last date of multidisciplinary goals of care discussion - 05/24/22. Family updated 7/28   CRITICAL CARE Performed by: Cristal Generous   Total critical care time: 40 minutes  Critical care time was exclusive of separately billable procedures and treating other patients. Critical care was necessary to treat or prevent imminent or life-threatening deterioration.  Critical care was time spent personally by me on the  following activities: development of treatment plan with patient and/or surrogate as well as nursing, discussions with consultants, evaluation of patient's response to treatment, examination of patient, obtaining history from patient or surrogate, ordering and performing treatments and interventions, ordering and review of laboratory studies, ordering and review of radiographic studies, pulse oximetry and re-evaluation of patient's condition.  Eliseo Gum MSN, AGACNP-BC [place] for pager  05/27/2022, 8:42 AM

## 2022-05-27 NOTE — Progress Notes (Signed)
Pt placed in PSV/CPAP by MD. Pt not tolerating SBT at this time and placed back on full vent support due to pt desat to 85%, increased WOB, tachypnea and tachycardia. Pt tolerating well SpO2 91%, MD aware, RN at bedside, RT will monitor.

## 2022-05-27 NOTE — Progress Notes (Signed)
Subjective: No significant changes  Exam: Vitals:   05/27/22 1000 05/27/22 1100  BP: (!) 118/55 (!) 147/74  Pulse: 87 79  Resp:    Temp:    SpO2: 90% 94%   Gen: Intubated  Neuro: MS: Does not open eyes or follow commands CN: Pupils equal round and reactive, does resist eye opening, eyes are conjugate Motor: She minimally flexes versus withdrawal in all four extremities Sensory: As above DTR: 2+ and symmetric at the knees and bicep  Pertinent Labs: Mildly elevated LFTs  Impression: 66 year old female who presented with progressive weakness and tremors and then developed profound encephalopathy with pleocytosis of 55 WBC, predominantly neutrophilic.  Given concern for meningitis causing spinal cord compromise in the setting of severe cervical stenosis, I started her on steroids with the hope of reducing the inflammation contributing to this compromise, but on repeat MRI, it does not look as severe. With layering  protenaceous material on MRI, do feel this is infectious and given recent hand-foot-mouth think that coxsackie is most likely culprit.   She does not have seizures, but I will trial Keppra to see if it helps with myoclonus.   Recommendations: 1) Keppra 1 g twice daily today, would not continue if it does not help myoclonus 2) ) continue supportive care.    Roland Rack, MD [facility] [phone]  If 7pm- 7am, please page neurology on call as listed in [place]. 05/27/2022  11:21 AM  If 7pm- 7am, please page neurology on call as listed in [place].

## 2022-05-27 NOTE — Progress Notes (Signed)
[place] for Infectious Disease   Reason for visit: Follow up on suspected meningitis  Interval History: fever to 101 this am, some worsening secretions.  WBC 10.9; sister at bedside.  Day 7 total antibiotics (including [place])  Physical Exam: Constitutional:  Vitals:   05/27/22 0935 05/27/22 1000  BP:  (!) 118/55  Pulse:  87  Resp:    Temp: (!) 101.3 F (38.5 C)   SpO2:  90%  No response Eyes: anicteric HENT: + ET Respiratory: respiratory effort on vent  Review of Systems: Unable to be assessed due to patient factors  Lab Results  Component Value Date   WBC 10.9 (H) 05/27/2022   HGB 8.4 (L) 05/27/2022   HCT 26.6 (L) 05/27/2022   MCV 78.7 (L) 05/27/2022   PLT 279 05/27/2022    Lab Results  Component Value Date   CREATININE 0.81 05/27/2022   BUN 24 (H) 05/27/2022   NA 140 05/27/2022   K 3.7 05/27/2022   CL 106 05/27/2022   CO2 27 05/27/2022    Lab Results  Component Value Date   ALT 47 (H) 05/25/2022   AST 53 (H) 05/25/2022   ALKPHOS 59 05/25/2022     Microbiology: Recent Results (from the past 240 hour(s))  MRSA Next Gen by PCR, Nasal     Status: None   Collection Time: 05/11/2022  9:42 PM   Specimen: Nasal Mucosa; Nasal Swab  Result Value Ref Range Status   MRSA by PCR Next Gen NOT DETECTED NOT DETECTED Final    Comment: (NOTE) The GeneXpert MRSA Assay (FDA approved for NASAL specimens only), is one component of a comprehensive MRSA colonization surveillance program. It is not intended to diagnose MRSA infection nor to guide or monitor treatment for MRSA infections. Test performance is not FDA approved in patients less than 36 years old. Performed at [facility], [place] [address] [place], [place] [address]   Urine Culture     Status: None   Collection Time: 05/24/22  2:11 AM   Specimen: Urine, Clean Catch  Result Value Ref Range Status   Specimen Description URINE, CLEAN CATCH  Final   Special Requests NONE  Final   Culture    Final    NO GROWTH Performed at [facility], [address], [place], [place] [address]    Report Status 05/25/2022 FINAL  Final  Culture, blood (Routine X 2) w Reflex to ID Panel     Status: None (Preliminary result)   Collection Time: 05/24/22  6:16 AM   Specimen: BLOOD RIGHT HAND  Result Value Ref Range Status   Specimen Description BLOOD RIGHT HAND  Final   Special Requests   Final    BOTTLES DRAWN AEROBIC AND ANAEROBIC Blood Culture adequate volume   Culture   Final    NO GROWTH 3 DAYS Performed at [facility], [place] [address] [place], [place] [address]    Report Status PENDING  Incomplete  Culture, blood (Routine X 2) w Reflex to ID Panel     Status: None (Preliminary result)   Collection Time: 05/24/22  6:17 AM   Specimen: BLOOD LEFT HAND  Result Value Ref Range Status   Specimen Description BLOOD LEFT HAND  Final   Special Requests   Final    BOTTLES DRAWN AEROBIC AND ANAEROBIC Blood Culture adequate volume   Culture   Final    NO GROWTH 3 DAYS Performed at [facility], [place]  [address], [place], [place] [address]    Report Status PENDING  Incomplete  Culture, Respiratory w Gram Stain     Status: None   Collection Time: 05/24/22  9:24 AM   Specimen: Bronchoalveolar Lavage; Respiratory  Result Value Ref Range Status   Specimen Description BRONCHIAL ALVEOLAR LAVAGE  Final   Special Requests Immunocompromised  Final   Gram Stain   Final    RARE WBC PRESENT, PREDOMINANTLY MONONUCLEAR NO ORGANISMS SEEN    Culture   Final    NO GROWTH 2 DAYS Performed at [facility], [address], [place], [place] [address]    Report Status 05/26/2022 FINAL  Final  Acid Fast Smear (AFB)     Status: None   Collection Time: 05/24/22  9:24 AM   Specimen: Bronchial Alveolar Lavage  Result Value Ref Range Status   AFB Specimen Processing Concentration  Final   Acid Fast Smear Negative  Final    Comment: (NOTE) Performed At: [facility] [place], [place] [id number] Rush Farmer MD [id number]    Source (AFB) BRONCHIAL ALVEOLAR LAVAGE  Final    Comment: Performed at [facility], [place] [address], [place], [place] [address]  Fungus Culture With Stain     Status: None (Preliminary result)   Collection Time: 05/24/22  9:24 AM   Specimen: Bronchial Alveolar Lavage  Result Value Ref Range Status   Fungus Stain Final report  Final    Comment: (NOTE) Performed At: [facility] [place], [place] [id number] Rush Farmer MD YJ:[id number]    Fungus (Mycology) Culture PENDING  Incomplete   Fungal Source BRONCHIAL ALVEOLAR LAVAGE  Final    Comment: Performed at [facility], [place] [address], [place], [place] [address]  Respiratory (~20 pathogens) panel by PCR     Status: None   Collection Time: 05/24/22  9:24 AM   Specimen: Bronchial Alveolar Lavage; Respiratory  Result Value Ref Range Status   Adenovirus NOT DETECTED NOT DETECTED Final   Coronavirus 229E NOT DETECTED NOT DETECTED Final    Comment: (NOTE) The Coronavirus on the Respiratory Panel, DOES NOT test for the novel  Coronavirus (2019 nCoV)    Coronavirus HKU1 NOT DETECTED NOT DETECTED Final   Coronavirus NL63 NOT DETECTED NOT DETECTED Final   Coronavirus OC43 NOT DETECTED NOT DETECTED Final   Metapneumovirus NOT DETECTED NOT DETECTED Final   Rhinovirus / Enterovirus NOT DETECTED NOT DETECTED Final   Influenza A NOT DETECTED NOT DETECTED Final   Influenza B NOT DETECTED NOT DETECTED Final   Parainfluenza Virus 1 NOT DETECTED NOT DETECTED Final   Parainfluenza Virus 2 NOT DETECTED NOT DETECTED Final   Parainfluenza Virus 3 NOT DETECTED NOT DETECTED Final   Parainfluenza Virus 4 NOT DETECTED NOT DETECTED Final   Respiratory Syncytial Virus NOT DETECTED NOT DETECTED Final   Bordetella pertussis NOT DETECTED NOT DETECTED Final   Bordetella Parapertussis NOT DETECTED NOT DETECTED Final   Chlamydophila pneumoniae NOT DETECTED  NOT DETECTED Final   Mycoplasma pneumoniae NOT DETECTED NOT DETECTED Final    Comment: Performed at [facility], [place]. [address], [place], [place] [address]  Fungus Culture Result     Status: None   Collection Time: 05/24/22  9:24 AM  Result Value Ref Range Status   Result 1 Comment  Final    Comment: (NOTE) KOH/Calcofluor preparation:  no fungus observed. Performed At: [facility] [address] [place] [address] [place], [place] [id number] Perlie Gold  Derinda Late MD MG:[id number]     Impression/Plan:  1. Probable viral meningoencephalitis - minimal response and no positive cultures again this am from [place].  Continue supportive care.    2.  Acute hypoxic respiratory failure - increased secretions and fever this am.  Concern for worsening or new infectious process.  Will add metronidazole to ceftriaxone or meropenem per CCM.    3. Isolation - on droplet isolation for HFM disease.    Dr. Tommy Medal available over the weekend if needed.

## 2022-05-27 NOTE — Progress Notes (Signed)
eLink Physician-Brief Progress Note Patient Name: Valerie Cline DOB: Mar 14, 1956 MRN: [id number]   Date of Service  05/27/2022  HPI/Events of Note  66 y/o F, Acute hypoxemic respiratory failure with neg bronch results; new RLL infiltrate - HCAP vs. Aspiration PNA. This has been on the background of Viral encephalitis presumed enterovirus - Neurology and ID are following.    Likely risk factor is Small cell lymphocytic B cell non-Hodgkin's lymphoma on Rituxan since 2020 (last dose June 2023)  Elink is called due to Precedex infusion at 1.30mg causing runs of bradycardia and pauses down to HR 30's.    Stopped Precedex infusion and preferentially used Fentanyl for sedation. Still with vent dysnchrony, and reported possible seizure activity.   eICU Interventions  - EEG reportedly ordered by ground team  - d/c Precedex, use Fentanyl/Propofol preferentially given bradycardia and ? Sz activity  - if Propofol is problematic we can try Ketamine  - Lung protective tidal volumes  - limiting driving pressures to < 15cm H2O as able - diuresis as tolerated         Valerie Cline N [name] 05/27/2022, 8:02 PM

## 2022-05-27 NOTE — Progress Notes (Signed)
Pharmacy Antibiotic Note  Valerie Cline is a 66 y.o. female admitted on 05/11/2022 with suspected viral meningitis and PNA. Antimicrobials consolidated to ceftriaxone yesterday but pt now spiking fevers. Pharmacy has been consulted for meropenem dosing.  Plan: Meropenem 1g IV q8h  Height: 5' 5.98" (167.6 cm) Weight: 90.9 kg (200 lb 6.4 oz) IBW/kg (Calculated) : 59.25  Temp (24hrs), Avg:99.1 F (37.3 C), Min:98 F (36.7 C), Max:101.3 F (38.5 C)  Recent Labs  Lab 05/24/22 0303 05/24/22 1230 05/25/22 0414 05/26/22 0221 05/27/22 0256  WBC 9.7  --  15.8* 14.0* 10.9*  CREATININE 0.84 0.82 0.83 0.80 0.81  LATICACIDVEN 1.0  --   --   --   --     Estimated Creatinine Clearance: 76.5 mL/min (by C-G formula based on SCr of 0.81 mg/dL).    Allergies  Allergen Reactions   Canagliflozin Other (See Comments)    recurrent vaginitis   Glucophage [Metformin] Other (See Comments)    GI intolerance   Niaspan [Niacin] Other (See Comments)    flushing      Thank you for allowing pharmacy to be a part of this patient's care.  Arrie Senate, PharmD, BCPS, [facility] Clinical Pharmacist [phone] Please check AMION for all [place] numbers 05/27/2022

## 2022-05-28 ENCOUNTER — Inpatient Hospital Stay (HOSPITAL_COMMUNITY): Payer: Medicare Other

## 2022-05-28 DIAGNOSIS — Q2112 Patent foramen ovale: Secondary | ICD-10-CM

## 2022-05-28 DIAGNOSIS — R609 Edema, unspecified: Secondary | ICD-10-CM

## 2022-05-28 DIAGNOSIS — G039 Meningitis, unspecified: Secondary | ICD-10-CM | POA: Diagnosis not present

## 2022-05-28 LAB — RESPIRATORY PANEL BY PCR

## 2022-05-28 LAB — CBC
HCT: 28.4 % — ABNORMAL LOW (ref 36.0–46.0)
Hemoglobin: 8.9 g/dL — ABNORMAL LOW (ref 12.0–15.0)
MCH: 25.2 pg — ABNORMAL LOW (ref 26.0–34.0)
MCHC: 31.3 g/dL (ref 30.0–36.0)
MCV: 80.5 fL (ref 80.0–100.0)
Platelets: 271 10*3/uL (ref 150–400)
RBC: 3.53 MIL/uL — ABNORMAL LOW (ref 3.87–5.11)
RDW: 16.7 % — ABNORMAL HIGH (ref 11.5–15.5)
WBC: 8.1 10*3/uL (ref 4.0–10.5)
nRBC: 0.2 % (ref 0.0–0.2)

## 2022-05-28 LAB — MAGNESIUM: Magnesium: 2 mg/dL (ref 1.7–2.4)

## 2022-05-28 LAB — BASIC METABOLIC PANEL
Anion gap: 6 (ref 5–15)
BUN: 35 mg/dL — ABNORMAL HIGH (ref 8–23)
CO2: 32 mmol/L (ref 22–32)
Calcium: 7.8 mg/dL — ABNORMAL LOW (ref 8.9–10.3)
Chloride: 105 mmol/L (ref 98–111)
Creatinine, Ser: 1.01 mg/dL — ABNORMAL HIGH (ref 0.44–1.00)
GFR, Estimated: >60 mL/min (ref >60)
Glucose, Bld: 197 mg/dL — ABNORMAL HIGH (ref 70–99)
Potassium: 4.1 mmol/L (ref 3.5–5.1)
Sodium: 143 mmol/L (ref 135–145)

## 2022-05-28 LAB — MISC LABCORP TEST (SEND OUT): Labcorp test code: 9985

## 2022-05-28 LAB — GLUCOSE, CAPILLARY
Glucose-Capillary: 156 mg/dL — ABNORMAL HIGH (ref 70–99)
Glucose-Capillary: 187 mg/dL — ABNORMAL HIGH (ref 70–99)
Glucose-Capillary: 188 mg/dL — ABNORMAL HIGH (ref 70–99)
Glucose-Capillary: 190 mg/dL — ABNORMAL HIGH (ref 70–99)
Glucose-Capillary: 216 mg/dL — ABNORMAL HIGH (ref 70–99)
Glucose-Capillary: 258 mg/dL — ABNORMAL HIGH (ref 70–99)

## 2022-05-28 LAB — PHOSPHORUS: Phosphorus: 3.3 mg/dL (ref 2.5–4.6)

## 2022-05-28 MED ORDER — PROPOFOL 1000 MG/100ML IV EMUL
5.0000 ug/kg/min | INTRAVENOUS | Status: DC
  Administered 2022-05-28: 5 ug/kg/min via INTRAVENOUS
  Administered 2022-05-28 – 2022-05-29 (×2): 10 ug/kg/min via INTRAVENOUS
  Filled 2022-05-28 (×3): qty 100

## 2022-05-28 MED ORDER — ACETAMINOPHEN 160 MG/5ML PO SOLN
650.0000 mg | Freq: Four times a day (QID) | ORAL | Status: DC | PRN
  Administered 2022-05-28 – 2022-06-25 (×7): 650 mg
  Filled 2022-05-28 (×7): qty 20.3

## 2022-05-28 MED ORDER — INSULIN ASPART 100 UNIT/ML IJ SOLN
18.0000 [IU] | INTRAMUSCULAR | Status: DC
Start: 2022-05-28 — End: 2022-05-29
  Administered 2022-05-28 – 2022-05-29 (×5): 18 [IU] via SUBCUTANEOUS

## 2022-05-28 MED ORDER — INSULIN DETEMIR 100 UNIT/ML ~~LOC~~ SOLN
68.0000 [IU] | Freq: Two times a day (BID) | SUBCUTANEOUS | Status: DC
  Administered 2022-05-28 (×2): 68 [IU] via SUBCUTANEOUS
  Filled 2022-05-28 (×3): qty 0.68

## 2022-05-28 MED ORDER — NOREPINEPHRINE 4 MG/250ML-% IV SOLN
0.0000 ug/min | INTRAVENOUS | Status: DC
  Administered 2022-05-28: 2 ug/min via INTRAVENOUS
  Administered 2022-05-29: 18 ug/min via INTRAVENOUS
  Administered 2022-05-29: 40 ug/min via INTRAVENOUS
  Filled 2022-05-28 (×4): qty 250

## 2022-05-28 MED ORDER — NAPROXEN 125 MG/5ML PO SUSP: 500.0000 mg | Freq: Two times a day (BID) | ORAL | Status: DC | PRN

## 2022-05-28 MED ORDER — ENOXAPARIN SODIUM 40 MG/0.4ML IJ SOSY
40.0000 mg | PREFILLED_SYRINGE | INTRAMUSCULAR | Status: DC
  Administered 2022-05-28: 40 mg via SUBCUTANEOUS
  Filled 2022-05-28: qty 0.4

## 2022-05-28 MED ORDER — IBUPROFEN 100 MG/5ML PO SUSP
400.0000 mg | Freq: Four times a day (QID) | ORAL | Status: DC | PRN
  Filled 2022-05-28 (×2): qty 20

## 2022-05-28 NOTE — Progress Notes (Signed)
Gerald Leitz, NP came to see patient. No orders given at this time. O2 saturations maintaining 92% at this time.

## 2022-05-28 NOTE — Progress Notes (Signed)
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   Discharge summary reviewed: She initially presented to OSH on 05/20/22 for left upper extremity tremors. She reported recent hand foot any mouth disease contracted from her grandchild that left her weak and tired prior to this admission. Neurology work-up including MRI, EEG negative. MRI/MRA neg for acute infarct, 13 mm focus in left frontal lobe representing artifact vs small calcified meningioma, chronic small vessel ischemic changes, cerebral atrophy, high-grade stenosis in bilateral ICA, focal stenosis in left ACA and intracranial right vertebral artery. MRI of C-spine showing C4-5 cord compression from advanced disc degeneration with posterior endplate ridging.  Showing left foraminal impingement at the same level.  Also showing C5-6 and C6-7 ADCF with solid arthrodesis.  Neurologist at [facility] had recommended neurosurgical evaluation.  She developed fevers and transferred to ICU concerning for meningitis and started on Vanc, rocephin, ampicillin and acyclovir. LP was performed on 7/24 with WBC 55 RBC 2. Pending herpes, enterovirus, ehrlichia. CT A/P showed bilateral ground glass and lung opacities concerning for multifocal pneumonia.   PCCM consulted for decreased responsiveness and hypoxemia on NRB. On exam obtunded and only able to say name. O2 sats in the 70s on NRB. Transferred to the ICU for emergent intubation   Pertinent  Medical History  As above  Significant Hospital Events: Including procedures,  antibiotic start and stop dates in addition to other pertinent events   7/24 Admitted to [facility] from [place]. Transferred to PCCM when found in respiratory failure 7/25 intubated, bronched, art line placed, PICC placed.  Neuro and ID consulted. Started decadron  7/26 decadron dc/d 7/27 felt most consistent with viral meningoencephalitis   Interim History / Subjective:  Some shaking episodes overnight, EEG ordered.  Dyssynchronous with vent.  Not following commands.  Objective   Blood pressure (!) 101/53, pulse 91, temperature 98.8 F (37.1 C), resp. rate (!) 28, height 5' 5.98" (1.676 m), weight 88.7 kg, SpO2 96 %.    Vent Mode: PRVC FiO2 (%):  [70 %-100 %] 100 % Set Rate:  [20 bmp] 20 bmp Vt Set:  [360 mL] 360 mL PEEP:  [10 MHD62-22 cmH20] 12 cmH20 Pressure Support:  [5 cmH20] 5 cmH20 Plateau Pressure:  [21 cmH20] 21 cmH20   Intake/Output Summary (Last 24 hours) at 05/28/2022 0729 Last data filed at 05/28/2022 0700 Gross per 24 hour  Intake 2849.03 ml  Output 2350 ml  Net 499.03 ml    Filed Weights   05/26/22 0400 05/27/22 0458 05/28/22 0500  Weight: 90.5 kg 90.9 kg 88.7 kg   Physical Exam: Ill appearing sedated woman Double triggering vent Ext warm Rhonci worse on R Minimal edema  CBC and BMP stable CXR stable R infiltrate  Resolved Hospital Problem list   N/A  Assessment & Plan:   Acute encephalopathy in setting of viral meningoencephalitis  P -below for meningoencephalitis -fent/prop for vent synchrony -precedex causing bradycardia -lightening sedation causing desaturations due to de-recruitment  Acute hypoxemic respiratory failure 2/2 multilobar PNA  CXR 7/28 personally reviewed, new R infiltrate r/o HCAP.  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  HSV OSH neg, culture neg, arbovirus panel penidng. Critical cervical myopathy - question baseline severe DJD with meningeal swelling threatening critical stenosis P - cont droplet  - appreciate ID and neuro following  -arboviral panel pending in [place]  -anticipate that recovery from viral process could take weeks/months  UTI - Positive UA at OSH P -meropenem should cover   HTN -PRN hydral   DM2 with severe hyperglycemia  -on high dose insulin at home P -adjust basal bolus for CBG 140-180 -cont rSSI   Small lymphocytic B cell non-hodgkin lymphoma -on maintenance rituximab (last received April 05 2022)   Urinary retention, mild AKI after diuretic challenge 7/28 -continue foley 7/28 - hold diuresis today, check CVP and f/u echo to get better idea of volume status  Best Practice (right click and "Reselect all SmartList Selections" daily)   Diet/type: EN  DVT prophylaxis: lovenox GI prophylaxis: PPI Lines: PICC  Foley:  Yes, and it is still needed -- replace 7/28  Code Status:  full code Last date of multidisciplinary goals of care discussion - 05/24/22. Family updated 7/28   45 min cc time Erskine Emery MD PCCM

## 2022-05-28 NOTE — Progress Notes (Signed)
PCCM called to bedside from patient having seizure like activity. Patient given versed to stop seizure. Patient noted to be febrile 101.7 F.   Seizure P: -check EEG -prn versed for seizure -continue keppra -unable to start propofol due to elevated triglycerides -will add ibu profen for fever; continue ice packs and tylenol  JD Rexene Agent [facility] 05/28/2022, 12:18 AM  Please see [email or web address] for pager details.  From 7A-7P if no response, please call [phone]. After hours, please call ELink [phone].

## 2022-05-28 NOTE — Progress Notes (Signed)
Notified Dr. Tamala Julian of bladder scan result of 966 ml. MD gave order to insert foley catheter.

## 2022-05-28 NOTE — Progress Notes (Signed)
Met with daughter  - Message sent to [employer] as Juluis Rainier - Likely need for trach/PEG and long term care discussed - Need for foley, broadened abx, and repeat EEG discussed - Consider MRI mid next week if no change in mental status - Will send ab profile for coxsackie virus - Monday consider asking for LP to be sent for coxsackie virus PCR   Enterovirus, Real-time PCR TEST: [id number]  CPT: 37096  Erskine Emery MD PCCM

## 2022-05-28 NOTE — Progress Notes (Signed)
Subjective: No significant changes  Exam: Vitals:   05/28/22 1845 05/28/22 1900  BP: (!) 89/47 (!) 106/51  Pulse: (!) 118 (!) 119  Resp:  (!) 26  Temp: 99.7 F (37.6 C) 99.7 F (37.6 C)  SpO2: 95% 95%   Gen: Intubated  Neuro: MS: Does not open eyes or follow commands CN: Pupils equal round and reactive,  Motor: She minimally flexes versus withdrawal in all four extremities Sensory: As above DTR: 2+ and symmetric at the knees and bicep  Pertinent Labs: Mildly elevated LFTs  Impression: 66 year old female who presented with progressive weakness and tremors and then developed profound encephalopathy with pleocytosis of 55 WBC, predominantly neutrophilic.  Given concern for meningitis causing spinal cord compromise in the setting of severe cervical stenosis, I started her on steroids with the hope of reducing the inflammation contributing to this compromise, but on repeat MRI, it does not look as severe. With layering  protenaceous material on MRI, do feel this is infectious and given recent hand-foot-mouth think that coxsackie is most likely culprit.   She does not have seizures, but I will trial Keppra to see if it helps with myoclonus.   Recommendations: 1) Keppra 1 g twice daily, will re-assess need tomorrow.  2) ) continue supportive care.    Roland Rack, MD [facility] [phone]  If 7pm- 7am, please page neurology on call as listed in [place]. 05/28/2022  7:30 PM  If 7pm- 7am, please page neurology on call as listed in [place].

## 2022-05-28 NOTE — Progress Notes (Signed)
RT called to patient's room due to desat.  RT increased vent FIO2 to 100%. Sats 87% - 88%.  RT manually ventilated patient using an ambu bag and peep valve, sats improved to 90-91%.  CCM notified. RT will continue to monitor.

## 2022-05-28 NOTE — Progress Notes (Signed)
VASCULAR LAB    Bilateral lower extremity venous duplex has been performed.  See CV proc for preliminary results.   Diala Waxman, RVT 05/28/2022, 10:48 AM

## 2022-05-28 NOTE — Progress Notes (Signed)
  Echocardiogram 2D Echocardiogram has been performed.  Johny Chess 05/28/2022, 11:19 AM

## 2022-05-28 NOTE — Progress Notes (Signed)
EEG complete - results pending 

## 2022-05-28 NOTE — Progress Notes (Signed)
After turning patient for clean up and repositioning she desated into the mid 80's and after suctioning patient twice and giving 100 % o2 breaths several times sats remained around 85% therefore Collie Siad, RT was called to come see patient. RT bagged patient to attempt to get saturations up. O2 sats now 90% back on ventilator and Collie Siad, RT asking Dr. [place] Bells to come see patient.

## 2022-05-29 ENCOUNTER — Inpatient Hospital Stay (HOSPITAL_COMMUNITY): Payer: Medicare Other

## 2022-05-29 DIAGNOSIS — G9341 Metabolic encephalopathy: Secondary | ICD-10-CM | POA: Diagnosis not present

## 2022-05-29 DIAGNOSIS — G039 Meningitis, unspecified: Secondary | ICD-10-CM | POA: Diagnosis not present

## 2022-05-29 DIAGNOSIS — I248 Other forms of acute ischemic heart disease: Secondary | ICD-10-CM

## 2022-05-29 LAB — CBC
HCT: 32.8 % — ABNORMAL LOW (ref 36.0–46.0)
HCT: 34.7 % — ABNORMAL LOW (ref 36.0–46.0)
Hemoglobin: 9.4 g/dL — ABNORMAL LOW (ref 12.0–15.0)
Hemoglobin: 10 g/dL — ABNORMAL LOW (ref 12.0–15.0)
MCH: 25.1 pg — ABNORMAL LOW (ref 26.0–34.0)
MCH: 24.2 pg — ABNORMAL LOW (ref 26.0–34.0)
MCHC: 28.7 g/dL — ABNORMAL LOW (ref 30.0–36.0)
MCHC: 28.8 g/dL — ABNORMAL LOW (ref 30.0–36.0)
MCV: 87.5 fL (ref 80.0–100.0)
MCV: 83.8 fL (ref 80.0–100.0)
Platelets: 403 10*3/uL — ABNORMAL HIGH (ref 150–400)
Platelets: 260 10*3/uL (ref 150–400)
RBC: 3.75 MIL/uL — ABNORMAL LOW (ref 3.87–5.11)
RBC: 4.14 MIL/uL (ref 3.87–5.11)
RDW: 18.2 % — ABNORMAL HIGH (ref 11.5–15.5)
RDW: 18 % — ABNORMAL HIGH (ref 11.5–15.5)
WBC: 20.5 10*3/uL — ABNORMAL HIGH (ref 4.0–10.5)
WBC: 20.2 10*3/uL — ABNORMAL HIGH (ref 4.0–10.5)
nRBC: 2.2 % — ABNORMAL HIGH (ref 0.0–0.2)
nRBC: 6.2 % — ABNORMAL HIGH (ref 0.0–0.2)

## 2022-05-29 LAB — BLOOD GAS, VENOUS
Acid-Base Excess: 0.7 mmol/L (ref 0.0–2.0)
Bicarbonate: 32.7 mmol/L — ABNORMAL HIGH (ref 20.0–28.0)
Drawn by: 3409
O2 Saturation: 80.8 %
Patient temperature: 37
pCO2, Ven: 96 mmHg (ref 44–60)
pH, Ven: 7.14 — CL (ref 7.25–7.43)
pO2, Ven: 51 mmHg — ABNORMAL HIGH (ref 32–45)

## 2022-05-29 LAB — GLUCOSE, CAPILLARY
Glucose-Capillary: 172 mg/dL — ABNORMAL HIGH (ref 70–99)
Glucose-Capillary: 185 mg/dL — ABNORMAL HIGH (ref 70–99)
Glucose-Capillary: 204 mg/dL — ABNORMAL HIGH (ref 70–99)
Glucose-Capillary: 219 mg/dL — ABNORMAL HIGH (ref 70–99)
Glucose-Capillary: 234 mg/dL — ABNORMAL HIGH (ref 70–99)
Glucose-Capillary: 263 mg/dL — ABNORMAL HIGH (ref 70–99)
Glucose-Capillary: 266 mg/dL — ABNORMAL HIGH (ref 70–99)
Glucose-Capillary: 267 mg/dL — ABNORMAL HIGH (ref 70–99)
Glucose-Capillary: 293 mg/dL — ABNORMAL HIGH (ref 70–99)
Glucose-Capillary: 293 mg/dL — ABNORMAL HIGH (ref 70–99)
Glucose-Capillary: 305 mg/dL — ABNORMAL HIGH (ref 70–99)
Glucose-Capillary: 311 mg/dL — ABNORMAL HIGH (ref 70–99)
Glucose-Capillary: 312 mg/dL — ABNORMAL HIGH (ref 70–99)
Glucose-Capillary: 334 mg/dL — ABNORMAL HIGH (ref 70–99)
Glucose-Capillary: 348 mg/dL — ABNORMAL HIGH (ref 70–99)
Glucose-Capillary: 474 mg/dL — ABNORMAL HIGH (ref 70–99)
Glucose-Capillary: 591 mg/dL (ref 70–99)
Glucose-Capillary: >600 mg/dL (ref 70–99)
Glucose-Capillary: >600 mg/dL (ref 70–99)

## 2022-05-29 LAB — COMPREHENSIVE METABOLIC PANEL
ALT: 1346 U/L — ABNORMAL HIGH (ref 0–44)
AST: 1934 U/L — ABNORMAL HIGH (ref 15–41)
Albumin: <1.5 g/dL — ABNORMAL LOW (ref 3.5–5.0)
Alkaline Phosphatase: 154 U/L — ABNORMAL HIGH (ref 38–126)
Anion gap: 10 (ref 5–15)
BUN: 52 mg/dL — ABNORMAL HIGH (ref 8–23)
CO2: 27 mmol/L (ref 22–32)
Calcium: 6.6 mg/dL — ABNORMAL LOW (ref 8.9–10.3)
Chloride: 91 mmol/L — ABNORMAL LOW (ref 98–111)
Creatinine, Ser: 2.18 mg/dL — ABNORMAL HIGH (ref 0.44–1.00)
GFR, Estimated: 24 mL/min — ABNORMAL LOW (ref >60)
Glucose, Bld: 830 mg/dL (ref 70–99)
Potassium: 5.3 mmol/L — ABNORMAL HIGH (ref 3.5–5.1)
Sodium: 128 mmol/L — ABNORMAL LOW (ref 135–145)
Total Bilirubin: 0.8 mg/dL (ref 0.3–1.2)
Total Protein: 4.1 g/dL — ABNORMAL LOW (ref 6.5–8.1)

## 2022-05-29 LAB — BLOOD GAS, ARTERIAL
Acid-Base Excess: 1.6 mmol/L (ref 0.0–2.0)
Bicarbonate: 32.8 mmol/L — ABNORMAL HIGH (ref 20.0–28.0)
Drawn by: 65688
O2 Saturation: 96.3 %
Patient temperature: 38.7
pCO2 arterial: 95 mmHg (ref 32–48)
pH, Arterial: 7.16 — CL (ref 7.35–7.45)
pO2, Arterial: 86 mmHg (ref 83–108)

## 2022-05-29 LAB — CULTURE, BLOOD (ROUTINE X 2)
Culture: NO GROWTH
Culture: NO GROWTH
Special Requests: ADEQUATE
Special Requests: ADEQUATE

## 2022-05-29 LAB — URINALYSIS, ROUTINE W REFLEX MICROSCOPIC
Bilirubin Urine: NEGATIVE
Glucose, UA: 150 mg/dL — AB
Ketones, ur: 5 mg/dL — AB
Leukocytes,Ua: NEGATIVE
Nitrite: NEGATIVE
Protein, ur: 100 mg/dL — AB
Specific Gravity, Urine: 1.028 (ref 1.005–1.030)
pH: 5 (ref 5.0–8.0)

## 2022-05-29 LAB — RENAL FUNCTION PANEL
Albumin: 2 g/dL — ABNORMAL LOW (ref 3.5–5.0)
Anion gap: 16 — ABNORMAL HIGH (ref 5–15)
BUN: 70 mg/dL — ABNORMAL HIGH (ref 8–23)
CO2: 26 mmol/L (ref 22–32)
Calcium: 7.1 mg/dL — ABNORMAL LOW (ref 8.9–10.3)
Chloride: 104 mmol/L (ref 98–111)
Creatinine, Ser: 2.79 mg/dL — ABNORMAL HIGH (ref 0.44–1.00)
GFR, Estimated: 18 mL/min — ABNORMAL LOW (ref >60)
Glucose, Bld: 257 mg/dL — ABNORMAL HIGH (ref 70–99)
Phosphorus: 6.9 mg/dL — ABNORMAL HIGH (ref 2.5–4.6)
Potassium: 4.7 mmol/L (ref 3.5–5.1)
Sodium: 146 mmol/L — ABNORMAL HIGH (ref 135–145)

## 2022-05-29 LAB — PROTIME-INR
INR: 2.1 — ABNORMAL HIGH (ref 0.8–1.2)
Prothrombin Time: 23.3 seconds — ABNORMAL HIGH (ref 11.4–15.2)

## 2022-05-29 LAB — ECHOCARDIOGRAM LIMITED
Height: 65.98 in
Weight: 3128.77 oz

## 2022-05-29 LAB — STREP PNEUMONIAE URINARY ANTIGEN: Strep Pneumo Urinary Antigen: NEGATIVE

## 2022-05-29 LAB — PHOSPHORUS: Phosphorus: 8.5 mg/dL — ABNORMAL HIGH (ref 2.5–4.6)

## 2022-05-29 LAB — BASIC METABOLIC PANEL
Anion gap: 9 (ref 5–15)
Anion gap: 16 — ABNORMAL HIGH (ref 5–15)
BUN: 56 mg/dL — ABNORMAL HIGH (ref 8–23)
BUN: 69 mg/dL — ABNORMAL HIGH (ref 8–23)
CO2: 30 mmol/L (ref 22–32)
CO2: 26 mmol/L (ref 22–32)
Calcium: 7.7 mg/dL — ABNORMAL LOW (ref 8.9–10.3)
Calcium: 7.3 mg/dL — ABNORMAL LOW (ref 8.9–10.3)
Chloride: 100 mmol/L (ref 98–111)
Chloride: 102 mmol/L (ref 98–111)
Creatinine, Ser: 2.22 mg/dL — ABNORMAL HIGH (ref 0.44–1.00)
Creatinine, Ser: 2.87 mg/dL — ABNORMAL HIGH (ref 0.44–1.00)
GFR, Estimated: 24 mL/min — ABNORMAL LOW (ref >60)
GFR, Estimated: 17 mL/min — ABNORMAL LOW (ref >60)
Glucose, Bld: 484 mg/dL — ABNORMAL HIGH (ref 70–99)
Glucose, Bld: 342 mg/dL — ABNORMAL HIGH (ref 70–99)
Potassium: 6.8 mmol/L (ref 3.5–5.1)
Potassium: 5.9 mmol/L — ABNORMAL HIGH (ref 3.5–5.1)
Sodium: 139 mmol/L (ref 135–145)
Sodium: 144 mmol/L (ref 135–145)

## 2022-05-29 LAB — CULTURE, RESPIRATORY W GRAM STAIN: Culture: NORMAL

## 2022-05-29 LAB — BETA-HYDROXYBUTYRIC ACID: Beta-Hydroxybutyric Acid: 0.32 mmol/L — ABNORMAL HIGH (ref 0.05–0.27)

## 2022-05-29 LAB — MAGNESIUM: Magnesium: 2.4 mg/dL (ref 1.7–2.4)

## 2022-05-29 LAB — HEPARIN LEVEL (UNFRACTIONATED)
Heparin Unfractionated: <0.1 IU/mL — ABNORMAL LOW (ref 0.30–0.70)
Heparin Unfractionated: <0.1 IU/mL — ABNORMAL LOW (ref 0.30–0.70)

## 2022-05-29 LAB — APTT: aPTT: 40 seconds — ABNORMAL HIGH (ref 24–36)

## 2022-05-29 LAB — TRIGLYCERIDES: Triglycerides: 481 mg/dL — ABNORMAL HIGH (ref <150)

## 2022-05-29 LAB — LACTIC ACID, PLASMA
Lactic Acid, Venous: 2.5 mmol/L (ref 0.5–1.9)
Lactic Acid, Venous: 5.7 mmol/L (ref 0.5–1.9)

## 2022-05-29 MED ORDER — VASOPRESSIN 20 UNITS/100 ML INFUSION FOR SHOCK
INTRAVENOUS | Status: AC
  Administered 2022-05-29: 20 [IU]
  Filled 2022-05-29: qty 100

## 2022-05-29 MED ORDER — PRISMASOL BGK 0/2.5 32-2.5 MEQ/L EC SOLN
Status: DC
  Filled 2022-05-29 (×4): qty 5000

## 2022-05-29 MED ORDER — ALTEPLASE 2 MG IJ SOLR: 2.0000 mg | Freq: Once | INTRAMUSCULAR | Status: DC | PRN

## 2022-05-29 MED ORDER — FUROSEMIDE 10 MG/ML IJ SOLN
40.0000 mg | Freq: Once | INTRAMUSCULAR | Status: AC
  Administered 2022-05-29: 40 mg via INTRAVENOUS
  Filled 2022-05-29: qty 4

## 2022-05-29 MED ORDER — INSULIN REGULAR(HUMAN) IN NACL 100-0.9 UT/100ML-% IV SOLN
INTRAVENOUS | Status: DC
  Administered 2022-05-29: 11.5 [IU]/h via INTRAVENOUS
  Filled 2022-05-29: qty 100

## 2022-05-29 MED ORDER — VANCOMYCIN HCL 1250 MG/250ML IV SOLN
1250.0000 mg | INTRAVENOUS | Status: DC
  Administered 2022-05-30: 1250 mg via INTRAVENOUS
  Filled 2022-05-29 (×2): qty 250

## 2022-05-29 MED ORDER — STERILE WATER FOR INJECTION IV SOLN
INTRAVENOUS | Status: DC
  Filled 2022-05-29 (×3): qty 1000

## 2022-05-29 MED ORDER — HEPARIN SODIUM (PORCINE) 1000 UNIT/ML DIALYSIS
1000.0000 [IU] | INTRAMUSCULAR | Status: DC | PRN
  Administered 2022-05-29 – 2022-06-06 (×3): 2400 [IU] via INTRAVENOUS_CENTRAL
  Filled 2022-05-29 (×6): qty 6

## 2022-05-29 MED ORDER — INSULIN ASPART 100 UNIT/ML IJ SOLN: 10.0000 [IU] | Freq: Once | INTRAMUSCULAR | Status: DC

## 2022-05-29 MED ORDER — INSULIN REGULAR(HUMAN) IN NACL 100-0.9 UT/100ML-% IV SOLN
INTRAVENOUS | Status: DC
  Administered 2022-05-29: 18 [IU]/h via INTRAVENOUS
  Administered 2022-05-29: 12 [IU]/h via INTRAVENOUS
  Administered 2022-05-30: 7.5 [IU]/h via INTRAVENOUS
  Administered 2022-05-30: 4.4 [IU]/h via INTRAVENOUS
  Filled 2022-05-29 (×4): qty 100

## 2022-05-29 MED ORDER — SODIUM BICARBONATE 8.4 % IV SOLN
100.0000 meq | Freq: Once | INTRAVENOUS | Status: AC
  Administered 2022-05-29: 100 meq via INTRAVENOUS
  Filled 2022-05-29: qty 50

## 2022-05-29 MED ORDER — MIDAZOLAM HCL 2 MG/2ML IJ SOLN: 1.0000 mg | INTRAMUSCULAR | Status: DC | PRN

## 2022-05-29 MED ORDER — ALTEPLASE (PULMONARY EMBOLISM) INFUSION
100.0000 mg | Freq: Once | INTRAVENOUS | Status: AC
  Administered 2022-05-29: 100 mg via INTRAVENOUS
  Filled 2022-05-29: qty 100

## 2022-05-29 MED ORDER — SODIUM CHLORIDE 0.9 % FOR CRRT: INTRAVENOUS_CENTRAL | Status: DC | PRN

## 2022-05-29 MED ORDER — HEPARIN SODIUM (PORCINE) 5000 UNIT/ML IJ SOLN
5000.0000 [IU] | Freq: Three times a day (TID) | INTRAMUSCULAR | Status: DC
Start: 2022-05-29 — End: 2022-05-29

## 2022-05-29 MED ORDER — SODIUM POLYSTYRENE SULFONATE 15 GM/60ML PO SUSP
30.0000 g | Freq: Once | ORAL | Status: AC
  Administered 2022-05-29: 30 g
  Filled 2022-05-29: qty 120

## 2022-05-29 MED ORDER — LORAZEPAM 2 MG/ML IJ SOLN
1.0000 mg | INTRAMUSCULAR | Status: DC | PRN
  Administered 2022-05-29 – 2022-05-31 (×6): 1 mg via INTRAVENOUS
  Filled 2022-05-29 (×9): qty 1

## 2022-05-29 MED ORDER — DEXTROSE 50 % IV SOLN: 0.0000 mL | INTRAVENOUS | Status: DC | PRN

## 2022-05-29 MED ORDER — IPRATROPIUM-ALBUTEROL 0.5-2.5 (3) MG/3ML IN SOLN
3.0000 mL | RESPIRATORY_TRACT | Status: DC
  Administered 2022-05-29 – 2022-06-04 (×34): 3 mL via RESPIRATORY_TRACT
  Filled 2022-05-29 (×33): qty 3

## 2022-05-29 MED ORDER — LEVETIRACETAM IN NACL 500 MG/100ML IV SOLN
500.0000 mg | Freq: Two times a day (BID) | INTRAVENOUS | Status: DC
  Administered 2022-05-30 – 2022-05-31 (×2): 500 mg via INTRAVENOUS
  Filled 2022-05-29 (×2): qty 100

## 2022-05-29 MED ORDER — PRISMASOL BGK 0/2.5 32-2.5 MEQ/L EC SOLN
Status: DC
  Filled 2022-05-29 (×3): qty 5000

## 2022-05-29 MED ORDER — INSULIN ASPART 100 UNIT/ML IV SOLN: 5.0000 [IU] | Freq: Once | INTRAVENOUS | Status: DC

## 2022-05-29 MED ORDER — VASOPRESSIN 20 UNITS/100 ML INFUSION FOR SHOCK
0.0000 [IU]/min | INTRAVENOUS | Status: DC
  Administered 2022-05-29 (×2): 0.03 [IU]/min via INTRAVENOUS
  Filled 2022-05-29 (×2): qty 100

## 2022-05-29 MED ORDER — PRISMASOL BGK 0/2.5 32-2.5 MEQ/L EC SOLN
Status: DC
  Filled 2022-05-29 (×12): qty 5000

## 2022-05-29 MED ORDER — METHYLPREDNISOLONE SODIUM SUCC 125 MG IJ SOLR
125.0000 mg | Freq: Two times a day (BID) | INTRAMUSCULAR | Status: DC
  Administered 2022-05-29 – 2022-06-05 (×15): 125 mg via INTRAVENOUS
  Filled 2022-05-29 (×16): qty 2

## 2022-05-29 MED ORDER — INSULIN REGULAR BOLUS VIA INFUSION
10.0000 [IU] | Freq: Once | INTRAVENOUS | Status: AC
  Administered 2022-05-29: 10 [IU] via INTRAVENOUS
  Filled 2022-05-29: qty 10

## 2022-05-29 MED ORDER — STERILE WATER FOR INJECTION IJ SOLN
INTRAMUSCULAR | Status: AC
  Administered 2022-05-29: 2 mL
  Filled 2022-05-29: qty 10

## 2022-05-29 MED ORDER — ALBUMIN HUMAN 25 % IV SOLN
25.0000 g | Freq: Four times a day (QID) | INTRAVENOUS | Status: AC
  Administered 2022-05-29 – 2022-05-30 (×4): 25 g via INTRAVENOUS
  Filled 2022-05-29 (×4): qty 100

## 2022-05-29 MED ORDER — HEPARIN (PORCINE) 25000 UT/250ML-% IV SOLN
1700.0000 [IU]/h | INTRAVENOUS | Status: DC
Start: 2022-05-29 — End: 2022-06-02
  Administered 2022-05-29: 1100 [IU]/h via INTRAVENOUS
  Administered 2022-05-30: 1300 [IU]/h via INTRAVENOUS
  Administered 2022-05-30: 1750 [IU]/h via INTRAVENOUS
  Administered 2022-05-31 – 2022-06-01 (×4): 2050 [IU]/h via INTRAVENOUS
  Administered 2022-06-02: 1900 [IU]/h via INTRAVENOUS
  Filled 2022-05-29 (×8): qty 250

## 2022-05-29 MED ORDER — SODIUM CHLORIDE 0.9 % IV SOLN
250.0000 mL | Freq: Once | INTRAVENOUS | Status: AC
  Administered 2022-05-29: 250 mL via INTRAVENOUS

## 2022-05-29 MED ORDER — VANCOMYCIN HCL 1750 MG/350ML IV SOLN
1750.0000 mg | Freq: Once | INTRAVENOUS | Status: AC
  Administered 2022-05-29: 1750 mg via INTRAVENOUS
  Filled 2022-05-29: qty 350

## 2022-05-29 MED ORDER — LACTATED RINGERS IV BOLUS
500.0000 mL | Freq: Once | INTRAVENOUS | Status: AC
  Administered 2022-05-29: 500 mL via INTRAVENOUS

## 2022-05-29 MED ORDER — SODIUM BICARBONATE 8.4 % IV SOLN
50.0000 meq | Freq: Once | INTRAVENOUS | Status: AC
  Administered 2022-05-29: 50 meq via INTRAVENOUS
  Filled 2022-05-29: qty 50

## 2022-05-29 MED ORDER — NOREPINEPHRINE 16 MG/250ML-% IV SOLN
0.0000 ug/min | INTRAVENOUS | Status: DC
  Administered 2022-05-29: 42 ug/min via INTRAVENOUS
  Filled 2022-05-29: qty 250

## 2022-05-29 MED ORDER — SODIUM CHLORIDE 0.9 % IV SOLN: 500.0000 [IU]/h | INTRAVENOUS | Status: DC

## 2022-05-29 MED ORDER — MIDAZOLAM HCL 2 MG/2ML IJ SOLN
1.0000 mg | INTRAMUSCULAR | Status: AC | PRN
  Administered 2022-05-30 (×3): 1 mg via INTRAVENOUS
  Filled 2022-05-29 (×3): qty 2

## 2022-05-29 MED ORDER — VANCOMYCIN HCL 1250 MG/250ML IV SOLN: 1250.0000 mg | INTRAVENOUS | Status: DC

## 2022-05-29 MED ORDER — HEPARIN SODIUM (PORCINE) 1000 UNIT/ML DIALYSIS: 1000.0000 [IU] | INTRAMUSCULAR | Status: DC | PRN

## 2022-05-29 NOTE — Progress Notes (Signed)
Report given to Ardelia Mems, Therapist, sports. Discussed patient's low temperature and night shift RN to place Retail banker. Blood warmer is on.

## 2022-05-29 NOTE — Procedures (Signed)
Arterial Catheter Insertion Procedure Note  SHERMAN LIPUMA  [id number]  30-May-1956  Date:05/29/22  Time:6:04 AM    Provider Performing: Collier Bullock    Procedure: Insertion of Arterial Line [phone] with US guidance ([id number])   Indication(s) Blood pressure monitoring and/or need for frequent ABGs  Consent Unable to obtain consent due to emergent nature of procedure.  Anesthesia None   Time Out Verified patient identification, verified procedure, site/side was marked, verified correct patient position, special equipment/implants available, medications/allergies/relevant history reviewed, required imaging and test results available.   Sterile Technique Maximal sterile technique including full sterile barrier drape, hand hygiene, sterile gown, sterile gloves, mask, hair covering, sterile ultrasound probe cover (if used).   Procedure Description Area of catheter insertion was cleaned with chlorhexidine and draped in sterile fashion. Without real-time ultrasound guidance an arterial catheter was placed into the left radial artery.  Appropriate arterial tracings confirmed on monitor.   Located the artery with [place], placed the line without US guidance in real time, placed on first attempt.   Complications/Tolerance None; patient tolerated the procedure well.   EBL Minimal   Specimen(s) None

## 2022-05-29 NOTE — Progress Notes (Signed)
Echocardiogram 2D Echocardiogram has been performed.  Oneal Deputy Taya Ashbaugh RDCS 05/29/2022, 7:45 AM   Dr. Loletha Grayer notified of critical results at 7:40

## 2022-05-29 NOTE — Progress Notes (Signed)
Pharmacy Antibiotic Note  Valerie Cline is a 66 y.o. female admitted on 05/16/2022 with suspected viral meningitis and PNA. Antimicrobials consolidated to ceftriaxone but pt began to spike fevers so meropenem started. Pt now with worsening MSOF and ongoing fevers, adding vancomycin. CRRT to be initiated.  Plan: Meropenem 1g IV q8h Vancomycin '1750mg'$  IV x1 then '1250mg'$  IV q24h   Height: 5' 5.98" (167.6 cm) Weight: 88.7 kg (195 lb 8.8 oz) IBW/kg (Calculated) : 59.25  Temp (24hrs), Avg:100.4 F (38 C), Min:98.6 F (37 C), Max:102 F (38.9 C)  Recent Labs  Lab 05/24/22 0303 05/24/22 1230 05/26/22 0221 05/27/22 0256 05/27/22 1951 05/28/22 0324 05/29/22 0229 05/29/22 0546  WBC 9.7   < > 14.0* 10.9* 8.5 8.1 20.5*  --   CREATININE 0.84   < > 0.80 0.81 0.91 1.01* 2.22* 2.18*  LATICACIDVEN 1.0  --   --   --   --   --   --  2.5*   < > = values in this interval not displayed.     Estimated Creatinine Clearance: 28.1 mL/min (A) (by C-G formula based on SCr of 2.18 mg/dL (H)).    Allergies  Allergen Reactions   Canagliflozin Other (See Comments)    recurrent vaginitis   Glucophage [Metformin] Other (See Comments)    GI intolerance   Niaspan [Niacin] Other (See Comments)    flushing      Thank you for allowing pharmacy to be a part of this patient's care.  Arrie Senate, PharmD, BCPS, [facility] Clinical Pharmacist [phone] Please check AMION for all [place] numbers 05/29/2022

## 2022-05-29 NOTE — Progress Notes (Signed)
No further jerking therefore ativan not given at this time. PRN ativan ordered for seizure.

## 2022-05-29 NOTE — Progress Notes (Addendum)
ABG results 1231  pH: 7.26 PaO2: 155 HC03: 29.5 PaCO2: 66 BE: 2  Follow up after vent changes, FIO2 decreased. No further changes made, plan to start CRRT.

## 2022-05-29 NOTE — Progress Notes (Signed)
Made Dr. Tamala Julian aware of lactic acid result of 5.7, MD acknowledged and no new orders given.

## 2022-05-29 NOTE — Progress Notes (Signed)
eLink Physician-Brief Progress Note Patient Name: Valerie Cline DOB: [birth year] MRN: [id number]   Date of Service  05/29/2022  HPI/Events of Note  Increasing vasopressor requirements Acute renal failure with poor UOP Hyperkalemia Refractory hyperglycemia  eICU Interventions  Insulin drip Kayexalate Abg now Cxr Repeat BMP at 0700     Intervention Category Major Interventions: Respiratory failure - evaluation and management;Shock - evaluation and management;Acute renal failure - evaluation and management  Mauri Brooklyn, P 05/29/2022, 3:57 AM

## 2022-05-29 NOTE — Progress Notes (Signed)
Pt. urine output and BP marginal at beginning of shift. Urine output zero for several hours; bladder scan showed a volume of zero, Elink notified. 565m bolus LR given as ordered, with no increase in output.   At 02:21, pt. had a run of ectopy, frequent PVCs to possible Vtach/Vent rhythym with a drop in HR, desat, and drop in BP. Contacted Elink; blood gas on venous sample performed. I-stat error occurred, but displayed profound acidosis and hyperkalemia later confirmed by lab result and ABG.   Pt. started on insulin drip, given kayexalate, 2 amps bicarb, vasopressin added for pressure support. An a-line was placed, ABG confirmed severe acidosis. Critical values for blood glucose and lactic acid reported to Elink.   Pt. BP stabilized on 40 mcg/min Levo and 0.03 vasopressin with no further cardiac events. Potassium and BG trending downward.   Dr. DIna Homesat bedside for shift change; family updated on events overnight and plan for today.

## 2022-05-29 NOTE — Procedures (Signed)
Central Venous Catheter Insertion Procedure Note  SANTINA TRILLO  [id number]  Nov 11, 1955  Date:05/29/22  Time:8:13 AM   Provider Performing:Karanvir Balderston Loletha Grayer Tamala Julian   Procedure: Insertion of Non-tunneled Central Venous [phone]) with US guidance ([id number])   Indication(s) Hemodialysis  Consent Risks of the procedure as well as the alternatives and risks of each were explained to the patient and/or caregiver.  Consent for the procedure was obtained and is signed in the bedside chart  Anesthesia Topical only with 1% lidocaine   Timeout Verified patient identification, verified procedure, site/side was marked, verified correct patient position, special equipment/implants available, medications/allergies/relevant history reviewed, required imaging and test results available.  Sterile Technique Maximal sterile technique including full sterile barrier drape, hand hygiene, sterile gown, sterile gloves, mask, hair covering, sterile ultrasound probe cover (if used).  Procedure Description Area of catheter insertion was cleaned with chlorhexidine and draped in sterile fashion.  With real-time ultrasound guidance a HD catheter was placed into the right internal jugular vein. Nonpulsatile blood flow and easy flushing noted in all ports.  The catheter was sutured in place and sterile dressing applied.  Complications/Tolerance None; patient tolerated the procedure well. Chest X-ray is ordered to verify placement for internal jugular or subclavian cannulation.   Chest x-ray is not ordered for femoral cannulation.  EBL Minimal  Specimen(s) None

## 2022-05-29 NOTE — Progress Notes (Signed)
[place] for heparin Indication: pulmonary embolus  Allergies  Allergen Reactions   Canagliflozin Other (See Comments)    recurrent vaginitis   Glucophage [Metformin] Other (See Comments)    GI intolerance   Niaspan [Niacin] Other (See Comments)    flushing    Patient Measurements: Height: 5' 5.98" (167.6 cm) Weight: 88.7 kg (195 lb 8.8 oz) IBW/kg (Calculated) : 59.25 Heparin Dosing Weight: 88kg  Vital Signs: Temp: 95.7 F (35.4 C) (07/30 1930) Temp Source: Esophageal (07/30 1748) BP: 128/70 (07/30 2025) Pulse Rate: 77 (07/30 2025)  Labs: Recent Labs    05/28/22 0324 05/29/22 0229 05/29/22 0546 05/29/22 1002 05/29/22 1226 05/29/22 1801 05/29/22 1835 05/29/22 1930  HGB 8.9* 9.4*  --   --  10.0*  --   --   --   HCT 28.4* 32.8*  --   --  34.7*  --   --   --   PLT 271 403*  --   --  260  --   --   --   APTT  --   --   --  40*  --   --   --   --   LABPROT  --   --   --  23.3*  --   --   --   --   INR  --   --   --  2.1*  --   --   --   --   HEPARINUNFRC  --   --   --   --   --   --  <0.10* <0.10*  CREATININE 1.01* 2.22* 2.18*  --  2.87* 2.79*  --   --      Estimated Creatinine Clearance: 22 mL/min (A) (by C-G formula based on SCr of 2.79 mg/dL (H)).   Medical History: Past Medical History:  Diagnosis Date   Allergy    Atherosclerotic heart disease of native coronary artery without angina pectoris 01/13/2020   Blood in stool    Carotid artery occlusion 08/13/2009   Carotid stenosis    Cervical lymphadenopathy 10/31/2019   CLL (chronic lymphocytic leukemia) ([place]) 16/08/9603   Complication of anesthesia    Coronary artery calcification seen on CT scan 06/01/2020   Diabetes mellitus without complication ([place])    Diabetic peripheral neuropathy associated with type 2 diabetes mellitus ([place]) 06/08/2009   Elevated liver function tests    Essential hypertension 11/08/2019   Fatty liver 09/06/2012   Goals of care,  counseling/discussion 10/28/2019   Hardening of the aorta (main artery of the heart) ([place]) 01/13/2020   History of adenomatous polyp of colon 03/16/2021   Hyperglycemia due to type 2 diabetes mellitus ([place]) 03/09/2021   Hyperlipidemia    Hypertension    Hypertriglyceridemia 03/16/2021   Long term (current) use of insulin ([place]) 03/09/2021   Lumbar radiculopathy 11/30/2021   Lymphadenopathy, abdominal 10/28/2019   Malignant lymphoma, small lymphocytic ([place]) 11/13/2019   Mixed dyslipidemia 06/01/2020   Obesity 04/19/2012   Personal history of COVID-19 11/13/2019   Pneumonia due to COVID-19 virus 11/08/2019   PONV (postoperative nausea and vomiting)    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 Pt with ongoing fevers and worsening clinical status overnight, RV severely down on beside ECHO  so concerns for acute PE. Pt in shock, s/p alteplase infusion and heparin infusion started with no bolus. Will target lower heparin level goal x24 hours.  Heparin drip currently at rate of 1100 units/hr. Heparin infusing through central line. Heparin level <0.1 subtherapeutic. Heparin level redrawn to confirm since level undetectable. Hgb 10.0 and plt 260; stable. No s/sx bleeding noted in chart.     Goal of Therapy:  Heparin level 0.3-0.5 units/ml Monitor platelets by anticoagulation protocol: Yes   Plan:  Increase heparin infusion to 1300 units/hr  Monitor daily heparin level and CBC Continue to monitor H&H     Gena Fray, PharmD PGY1 Pharmacy Resident   05/29/2022 9:05 PM

## 2022-05-29 NOTE — Consult Note (Signed)
Renal Service Consult Note [facility]  Valerie Cline 05/29/2022 Sol Blazing, MD Requesting Physician: Dr. Charlsie Quest  Reason for Consult: AKI HPI: The patient is a 66 y.o. year-old w/ hx HTN, hL, DM, CAD, carotid stenosis and hx NHL last chemoRx June 2023 presented to OSH on 7/21 for LUE tremors. She had recently developed hand-foot-and mouth disease. Work-up showed neg MRI of brain but MRI of C-spine showed sig cord compression at C4-5. Neurologist recommended NSurg evaluation. Pt then decompensated w/ high givers and AMS, concerning for meningitis and was moved to ICU. LP showed wbc 55 and CT showed multifocal PNA. Pt transferred to [facility] on 7/24. On 7/25 pt become poorly responsiveness and was intubated and moved to ICU. Fevers continued and pt developed hypotension/ shock on 7/29 and was started on vaso and levo gtts.  Creat 0.8 on admit and stable until up to 1.0 yest and 2.2 today. K+ 6.8 today. Asked to see for CRRT.    Pt seen in ICU. On the vent, no hx obtained from the pt.   ROS - n/a   Past Medical History  Past Medical History:  Diagnosis Date   Allergy    Atherosclerotic heart disease of native coronary artery without angina pectoris 01/13/2020   Blood in stool    Carotid artery occlusion 08/13/2009   Carotid stenosis    Cervical lymphadenopathy 10/31/2019   CLL (chronic lymphocytic leukemia) ([place]) 40/98/1191   Complication of anesthesia    Coronary artery calcification seen on CT scan 06/01/2020   Diabetes mellitus without complication ([place])    Diabetic peripheral neuropathy associated with type 2 diabetes mellitus ([place]) 06/08/2009   Elevated liver function tests    Essential hypertension 11/08/2019   Fatty liver 09/06/2012   Goals of care, counseling/discussion 10/28/2019   Hardening of the aorta (main artery of the heart) ([place]) 01/13/2020   History of adenomatous polyp of colon 03/16/2021   Hyperglycemia due to type 2 diabetes mellitus ([place]) 03/09/2021    Hyperlipidemia    Hypertension    Hypertriglyceridemia 03/16/2021   Long term (current) use of insulin ([place]) 03/09/2021   Lumbar radiculopathy 11/30/2021   Lymphadenopathy, abdominal 10/28/2019   Malignant lymphoma, small lymphocytic ([place]) 11/13/2019   Mixed dyslipidemia 06/01/2020   Obesity 04/19/2012   Personal history of COVID-19 11/13/2019   Pneumonia due to COVID-19 virus 11/08/2019   PONV (postoperative nausea and vomiting)    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 She has never used smokeless tobacco. She reports that she does not drink alcohol and does not use drugs. Allergies  Allergies  Allergen Reactions   Canagliflozin Other (See Comments)    recurrent vaginitis   Glucophage [Metformin] Other (See Comments)    GI intolerance  Niaspan [Niacin] Other (See Comments)    flushing   Home medications Prior to Admission medications   Medication Sig Start Date End Date Taking? Authorizing Provider  amLODipine (NORVASC) 2.5 MG tablet TAKE 1 TABLET BY MOUTH EVERY DAY Patient taking differently: Take 2.5 mg by mouth daily. 09/22/21  Yes Volanda Napoleon, MD  aspirin EC 81 MG tablet Take 81 mg by mouth daily. Swallow whole.   Yes [provider]  benazepril (LOTENSIN) 20 MG tablet Take 20 mg by mouth at bedtime.   Yes [provider]  Dulaglutide 3 MG/0.5ML  SOPN Inject 3 mg into the skin once a week. NO certain day   Yes [provider]  famciclovir (FAMVIR) 500 MG tablet Take 1 tablet (500 mg total) by mouth daily. 12/16/21  Yes Ennever, Rudell Cobb, MD  hydrochlorothiazide (HYDRODIURIL) 25 MG tablet Take 25 mg by mouth at bedtime. 11/11/21  Yes [provider]  icosapent Ethyl (VASCEPA) 1 g capsule Take 2 g by mouth 2 (two) times daily.   Yes [provider]  insulin regular human CONCENTRATED (HUMULIN R) 500 UNIT/ML injection Inject 50-90 Units into the skin See admin instructions. Inject 80 units subcutaneously before breakfast, and 50 units before lunch and 90 units for supper   Yes [provider]  Multiple Vitamins-Minerals (MULTIVITAMIN WITH MINERALS) tablet Take 1 tablet by mouth daily.   Yes [provider]  potassium chloride SA (KLOR-CON M) 20 MEQ tablet Take 20 mEq by mouth 2 (two) times daily. 02/18/22  Yes [provider]  rosuvastatin (CRESTOR) 20 MG tablet Take 1 tablet (20 mg total) by mouth daily. 01/13/22  Yes Jerline Pain, MD  Vitamin D3 (VITAMIN D) 25 MCG tablet Take 1,000 Units by mouth daily.   Yes [provider]  ACCU-CHEK GUIDE test strip 3 (three) times daily. 03/22/22   [provider]  BD INSULIN SYRINGE U-500 31G X 6MM 0.5 ML MISC 3 (three) times daily. as directed 03/23/22   [provider]  Continuous Blood Gluc Sensor (DEXCOM G7 SENSOR) MISC CHANGE SENSOR EVERY 10 DAYS 02/12/22   [provider]  prochlorperazine (COMPAZINE) 10 MG tablet Take 1 tablet (10 mg total) by mouth every 6 (six) hours as needed (Nausea or vomiting). 02/11/20 08/27/20  Volanda Napoleon, MD     Vitals:   05/29/22 0430 05/29/22 0500 05/29/22 0515 05/29/22 0530  BP: (!) 104/57 128/61 132/64 121/61  Pulse: (!) 118 (!) 131 (!) 127 (!) 125  Resp:      Temp: (!) 102 F (38.9 C) (!) 102 F (38.9 C) (!) 101.8 F (38.8 C) (!) 102 F (38.9 C)  TempSrc:      SpO2: 90%  (!) 87% (!) 88% (!) 88%  Weight:      Height:       Exam Gen on vent, sedated No rash, cyanosis or gangrene Sclera anicteric, throat w/ ETT No jvd or bruits Chest clear anterior/ latera RRR no RG Abd soft ntnd no mass or ascites +bs GU foley draining clear light amber urine MS no joint effusions or deformity Ext 1+ bilat UE > LE edema, no wounds or ulcers Neuro is on vent, sedated   Home meds include - amlodipine 2.5, aspirin, benazepril 20, dulaglutide weekly, famciclovir, hydrochlorothiazide 25, icosapent bid, insulin regular, MVI, klorcon 20 bid, rosuvastatin, prns / vits/ supps   Admit creat wnl 0.8    Labs today Na 139  K 6.8 CO2 30  BUN 56  Creat 2.22    WBC 20K HB 9.4  plt 403    Viral panel - +rhinovirus/ enterovirus 7/29     Admit 7/24 w/ normal BP's, BP's down last night started on vaso/ levo gtts      Hectic fevers since admission - blood cx's negative      SP IV vanc 7/24- 7/26            IV acylovir 7/24- 726            IV ampicillin 7/24- 7/25            SP cefepime 7/25- 7/26, ceftriaxone 7/24 - 7/27             IV meropenem 7/28 - current        CXR 7/30 - IMPRESSION: Stable lines and tubes.  Bilateral pneumonia suspected, with right lung ventilation mildly improved since 05/27/2022. No new cardiopulmonary abnormality.    Assessment/ Plan: AKI - b/l creat 0.8 on admission. Creat rising now in setting of new onset shock. Pt admitted for viral meningoencephalitis complicated by critical cervical myopathy and now resp failure w/ multifocal PNA. Pt has lymphoma receiving active chemoRx (rituximab) w/ last dose in June 2023. AKI due to shock which started in last 24 hrs abruptly. CCM suspecting acute PE, will get empiric TPA and IV heparin. K+ up to 6.8, will plan CCRT w/ all 2K fluids for now.  Volume - mod edema on exam, I/O + 12L since admit, wt's up 8-10 kg. Will keep even for now w/ acute shock.  Shock - unclear etiology, as above, on vasopressor support H/o  lymphoma - getting active chemoRx w/ last dose rituxan in June 2023 AHRF - on vent, multifocal PNA on broad spec IV abx per CCM AMS - in setting of viral meningoencephalitis  Cervical myelopathy  DM2 - on insulin      Rob Frances Ambrosino  MD 05/29/2022, 5:36 AM Recent Labs  Lab 05/24/22 0303 05/24/22 0558 05/25/22 0414 05/25/22 0948 05/28/22 0324 05/29/22 0229  HGB 8.6*   < > 9.5*   < > 8.9* 9.4*  ALBUMIN 2.1*  --  1.9*  --   --   --   CALCIUM 7.3*   < > 8.0*   < > 7.8* 7.7*  PHOS  --   --  2.5   < > 3.3 8.5*  CREATININE 0.84   < > 0.83   < > 1.01* 2.22*  K 2.7*   < > 3.6   < > 4.1 6.8*   < > = values in this interval not displayed.

## 2022-05-29 NOTE — Progress Notes (Signed)
Went to bedside to place EEG, and pt is waiting for CT to come get  her. Try back later per RN as they may NOT need EEG after CT

## 2022-05-29 NOTE — Progress Notes (Signed)
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   Discharge summary reviewed: She initially presented to OSH on 05/20/22 for left upper extremity tremors. She reported recent hand foot any mouth disease contracted from her grandchild that left her weak and tired prior to this admission. Neurology work-up including MRI, EEG negative. MRI/MRA neg for acute infarct, 13 mm focus in left frontal lobe representing artifact vs small calcified meningioma, chronic small vessel ischemic changes, cerebral atrophy, high-grade stenosis in bilateral ICA, focal stenosis in left ACA and intracranial right vertebral artery. MRI of C-spine showing C4-5 cord compression from advanced disc degeneration with posterior endplate ridging.  Showing left foraminal impingement at the same level.  Also showing C5-6 and C6-7 ADCF with solid arthrodesis.  Neurologist at [facility] had recommended neurosurgical evaluation.  She developed fevers and transferred to ICU concerning for meningitis and started on Vanc, rocephin, ampicillin and acyclovir. LP was performed on 7/24 with WBC 55 RBC 2. Pending herpes, enterovirus, ehrlichia. CT A/P showed bilateral ground glass and lung opacities concerning for multifocal pneumonia.   PCCM consulted for decreased responsiveness and hypoxemia on NRB. On exam obtunded and only able to say name. O2 sats in the 70s on NRB. Transferred to the ICU for emergent intubation   Pertinent  Medical History  As above  Significant Hospital Events: Including procedures,  antibiotic start and stop dates in addition to other pertinent events   7/24 Admitted to [facility] from [place]. Transferred to PCCM when found in respiratory failure 7/25 intubated, bronched, art line placed, PICC placed.  Neuro and ID consulted. Started decadron  7/26 decadron dc/d 7/27 felt most consistent with viral meningoencephalitis  7/29-7/30 sudden onset multiorgan failure  Interim History / Subjective:  Now in multiorgan failure overnight, sudden onset.  Objective   Blood pressure 121/64, pulse (!) 119, temperature (!) 100.9 F (38.3 C), resp. rate (!) 26, height 5' 5.98" (1.676 m), weight 88.7 kg, SpO2 93 %. CVP:  [3 mmHg-35 mmHg] 35 mmHg  Vent Mode: PRVC FiO2 (%):  [80 %-100 %] 100 % Set Rate:  [20 bmp-30 bmp] 30 bmp Vt Set:  [360 mL-470 mL] 470 mL PEEP:  [12 cmH20-14 cmH20] 14 cmH20   Intake/Output Summary (Last 24 hours) at 05/29/2022 0720 Last data filed at 05/29/2022 0500 Gross per 24 hour  Intake 3792.43 ml  Output 2260 ml  Net 1532.43 ml    Filed Weights   05/26/22 0400 05/27/22 0458 05/28/22 0500  Weight: 90.5 kg 90.9 kg 88.7 kg   Physical Exam: Ill appearing sedated woman Not triggering vent RV blown out on echo No edema Lungs rhonci worse on R  Sudden onset large dead space Sugars up Renal function down  Resolved Hospital Problem list   N/A  Assessment & Plan:   Acute encephalopathy in setting of viral meningoencephalitis  P -below for meningoencephalitis -fent/ PRNversed for vent synchrony -daily SAT when more stable  Acute hypoxemic respiratory failure 2/2 multilobar PNA  CXR 7/28 new R infiltrate r/o HCAP.  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  HSV OSH neg, culture neg, arbovirus panel penidng. Critical cervical myopathy - question baseline severe DJD  with meningeal swelling threatening critical stenosis P - cont droplet  - appreciate ID and neuro following  - arboviral panel pending in [place]  - f/u enterovirus PCR from [place] added on by [facility] 7/29 -anticipate that recovery from viral process could take weeks/months  UTI - Positive UA at OSH P -meropenem should cover   DM2 with severe hyperglycemia  -insulin gtt for now  Small lymphocytic B cell non-hodgkin lymphoma -on maintenance rituximab (last received April 05 2022)  - message sent to Dr. Marin Olp regarding admission  New 7/30: sudden onset shock, acute renal failure, acute liver failure, RV dilation, large dead space - Only thing physiologically that could do this is pulmonary embolism - Will get HD catheter in and do '100mg'$  tPA, daughter consents; heparin gtt after - If marked improvement after this we have our answer  Best Practice (right click and "Reselect all SmartList Selections" daily)   Diet/type: NPO for now DVT prophylaxis: heparin gtt GI prophylaxis: PPI Lines: PICC  Foley:  Yes, and it is still needed  Code Status:  full code Last date of multidisciplinary goals of care discussion - 05/24/22. Family updated 7/30   85 min cc time independent of procedures Erskine Emery MD PCCM

## 2022-05-29 NOTE — Progress Notes (Addendum)
[place] for heparin Indication: pulmonary embolus  Allergies  Allergen Reactions   Canagliflozin Other (See Comments)    recurrent vaginitis   Glucophage [Metformin] Other (See Comments)    GI intolerance   Niaspan [Niacin] Other (See Comments)    flushing    Patient Measurements: Height: 5' 5.98" (167.6 cm) Weight: 88.7 kg (195 lb 8.8 oz) IBW/kg (Calculated) : 59.25 Heparin Dosing Weight: 88kg  Vital Signs: Temp: 100.9 F (38.3 C) (07/30 0700) BP: 121/64 (07/30 0700) Pulse Rate: 119 (07/30 0700)  Labs: Recent Labs    05/27/22 1951 05/28/22 0324 05/29/22 0229 05/29/22 0546  HGB 9.5* 8.9* 9.4*  --   HCT 30.5* 28.4* 32.8*  --   PLT 280 271 403*  --   CREATININE 0.91 1.01* 2.22* 2.18*    Estimated Creatinine Clearance: 28.1 mL/min (A) (by C-G formula based on SCr of 2.18 mg/dL (H)).   Medical History: Past Medical History:  Diagnosis Date   Allergy    Atherosclerotic heart disease of native coronary artery without angina pectoris 01/13/2020   Blood in stool    Carotid artery occlusion 08/13/2009   Carotid stenosis    Cervical lymphadenopathy 10/31/2019   CLL (chronic lymphocytic leukemia) ([place]) 74/94/4967   Complication of anesthesia    Coronary artery calcification seen on CT scan 06/01/2020   Diabetes mellitus without complication ([place])    Diabetic peripheral neuropathy associated with type 2 diabetes mellitus ([place]) 06/08/2009   Elevated liver function tests    Essential hypertension 11/08/2019   Fatty liver 09/06/2012   Goals of care, counseling/discussion 10/28/2019   Hardening of the aorta (main artery of the heart) ([place]) 01/13/2020   History of adenomatous polyp of colon 03/16/2021   Hyperglycemia due to type 2 diabetes mellitus ([employer]) 03/09/2021   Hyperlipidemia    Hypertension    Hypertriglyceridemia 03/16/2021   Long term (current) use of insulin ([place]) 03/09/2021   Lumbar radiculopathy 11/30/2021    Lymphadenopathy, abdominal 10/28/2019   Malignant lymphoma, small lymphocytic ([place]) 11/13/2019   Mixed dyslipidemia 06/01/2020   Obesity 04/19/2012   Personal history of COVID-19 11/13/2019   Pneumonia due to COVID-19 virus 11/08/2019   PONV (postoperative nausea and vomiting)    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 ([place]) 12/30/2019   T2DM (type 2 diabetes mellitus) ([place]) 11/08/2019   Thyroid nodule 03/16/2021   Type 2 diabetes mellitus with unspecified complications ([place]) 59/16/3846   Assessment: 79 yoF admitted with meningitis from OSH. Pt with ongoing fevers and worsening clinical status overnight, RV severely down on beside ECHO so concerns for acute PE. Pt in shock, alteplase to be administered with heparin infusion started after once aPTT <80. Will target lower heparin level goal x24 hours.  Goal of Therapy:  Heparin level 0.3-0.5 units/ml Monitor platelets by anticoagulation protocol: Yes   Plan:  Will check aPTT after alteplase infusion complete Start heparin once aPTT <80  ADDENDUM 1106: alteplase infusion complete, post-infusion coag labs drawn. INR 2.1, aPTT 40. Will begin IV heparin 1100 units/h no bolus, check 6h heparin level.  Arrie Senate, PharmD, BCPS, [facility] Clinical Pharmacist [phone] Please check AMION for all [place] numbers 05/29/2022

## 2022-05-29 NOTE — Progress Notes (Signed)
eLink Physician-Brief Progress Note Patient Name: FREDDI FORSTER DOB: 03-Jul-1956 MRN: [id number]   Date of Service  05/29/2022  HPI/Events of Note  Overall condition continuing to worsen  eICU Interventions  Consult nephrology to discuss CRRT     Intervention Category Major Interventions: Acute renal failure - evaluation and management;Respiratory failure - evaluation and management;Shock - evaluation and management  Mauri Brooklyn, P 05/29/2022, 4:58 AM

## 2022-05-29 NOTE — Progress Notes (Signed)
No mach. Avail to do this pt, let Dr. Leonel Ramsay know and he advised ok to wait until tomorrow for this LTM

## 2022-05-29 NOTE — Progress Notes (Signed)
Made Dr. Tamala Julian and Dr. Leonel Ramsay aware that patient started having sudden right arm jerking. Both MD's acknowledged. Dr. Tamala Julian ordered 1 mg of IV ativan.

## 2022-05-29 NOTE — Progress Notes (Signed)
Made Dr. Tamala Julian aware that endo tool has asked to add dextrose to IVF since her CBG has been less than 250.  Also made MD aware that levophed titrated off for now and down titrating vasopressin at this time. MD acknowledged and gave no new orders at this time.

## 2022-05-29 NOTE — Progress Notes (Signed)
ABG results 1014  pH: 7.16 PaO2: 147 PaCO2: 84 HC03: 29.9 BE: 0  RR on ventilator increased from 30 to 35. FIO2 decreased gradually per SATs.

## 2022-05-29 NOTE — Progress Notes (Addendum)
Subjective: Has developed more medical issues, received tPA for PE earlier.  Exam: Vitals:   05/29/22 1500 05/29/22 1515  BP: (!) 155/82   Pulse: 97 94  Resp:    Temp: 98.4 F (36.9 C) 97.9 F (36.6 C)  SpO2: 92% 93%   Gen: Intubated  Neuro: MS: Does not open eyes or follow commands CN: Pupils equal round and reactive, corneals are intact Motor: No movement to noxious stimulation Sensory: As above   Pertinent Labs: Mildly elevated LFTs  Impression: 66 year old female who presented with progressive weakness and tremors and then developed profound encephalopathy with pleocytosis of 55 WBC, predominantly neutrophilic.  Given concern for meningitis causing spinal cord compromise in the setting of severe cervical stenosis, I started her on steroids with the hope of reducing the inflammation contributing to this compromise, but on repeat MRI, it does not look as severe. With layering  protenaceous material on MRI in the setting of fevers and consistent clinical syndrome, do feel this is infectious meningoencephalitis and given recent hand-foot-mouth think that coxsackie is a likely culprit.   She has continued to have some twitching activity, my suspicion is this is similar to what was seen earlier in the hospitalization, but will perform another overnight EEG prior to discontinuing Keppra.   Recommendations: 1) decrease Keppra to 500 mg twice daily given renal function, consider discontinuing if EEG is again negative 2) overnight EEG 3) CT head 4) neurology will follow   This patient is critically ill and at significant risk of neurological worsening, death and care requires constant monitoring of vital signs, hemodynamics,respiratory and cardiac monitoring, neurological assessment, discussion with family, other specialists and medical decision making of high complexity. I spent 35 minutes of neurocritical care time  in the care of  this patient. This was time spent independent of any  time provided by nurse practitioner or PA.  Roland Rack, MD [facility] [phone]  If 7pm- 7am, please page neurology on call as listed in [place]. 05/29/2022  4:17 PM

## 2022-05-29 NOTE — Progress Notes (Signed)
Marked improvement in hemodynamics and deadspace after tpa given.  Head CT looks okay.  Will start CRRT to help with volume and potassium.  Family updated.  Erskine Emery MD PCCM

## 2022-05-30 ENCOUNTER — Encounter (HOSPITAL_COMMUNITY): Payer: Self-pay | Admitting: Internal Medicine

## 2022-05-30 ENCOUNTER — Inpatient Hospital Stay (HOSPITAL_COMMUNITY): Payer: Medicare Other

## 2022-05-30 DIAGNOSIS — C8300 Small cell B-cell lymphoma, unspecified site: Secondary | ICD-10-CM

## 2022-05-30 DIAGNOSIS — G039 Meningitis, unspecified: Secondary | ICD-10-CM | POA: Diagnosis not present

## 2022-05-30 DIAGNOSIS — R509 Fever, unspecified: Secondary | ICD-10-CM

## 2022-05-30 DIAGNOSIS — R945 Abnormal results of liver function studies: Secondary | ICD-10-CM

## 2022-05-30 DIAGNOSIS — R270 Ataxia, unspecified: Secondary | ICD-10-CM

## 2022-05-30 DIAGNOSIS — A419 Sepsis, unspecified organism: Secondary | ICD-10-CM

## 2022-05-30 DIAGNOSIS — C8580 Other specified types of non-Hodgkin lymphoma, unspecified site: Secondary | ICD-10-CM

## 2022-05-30 DIAGNOSIS — B9719 Other enterovirus as the cause of diseases classified elsewhere: Secondary | ICD-10-CM | POA: Diagnosis not present

## 2022-05-30 DIAGNOSIS — R6520 Severe sepsis without septic shock: Secondary | ICD-10-CM

## 2022-05-30 DIAGNOSIS — K7200 Acute and subacute hepatic failure without coma: Secondary | ICD-10-CM

## 2022-05-30 DIAGNOSIS — I2692 Saddle embolus of pulmonary artery without acute cor pulmonale: Secondary | ICD-10-CM

## 2022-05-30 DIAGNOSIS — B341 Enterovirus infection, unspecified: Secondary | ICD-10-CM

## 2022-05-30 DIAGNOSIS — Z992 Dependence on renal dialysis: Secondary | ICD-10-CM

## 2022-05-30 DIAGNOSIS — J9601 Acute respiratory failure with hypoxia: Secondary | ICD-10-CM

## 2022-05-30 DIAGNOSIS — A879 Viral meningitis, unspecified: Secondary | ICD-10-CM | POA: Diagnosis not present

## 2022-05-30 DIAGNOSIS — D329 Benign neoplasm of meninges, unspecified: Secondary | ICD-10-CM | POA: Diagnosis not present

## 2022-05-30 DIAGNOSIS — G959 Disease of spinal cord, unspecified: Secondary | ICD-10-CM | POA: Diagnosis not present

## 2022-05-30 DIAGNOSIS — E875 Hyperkalemia: Secondary | ICD-10-CM

## 2022-05-30 DIAGNOSIS — J181 Lobar pneumonia, unspecified organism: Secondary | ICD-10-CM

## 2022-05-30 DIAGNOSIS — N179 Acute kidney failure, unspecified: Secondary | ICD-10-CM

## 2022-05-30 DIAGNOSIS — G9341 Metabolic encephalopathy: Secondary | ICD-10-CM | POA: Diagnosis not present

## 2022-05-30 DIAGNOSIS — R739 Hyperglycemia, unspecified: Secondary | ICD-10-CM

## 2022-05-30 LAB — RENAL FUNCTION PANEL
Albumin: 3 g/dL — ABNORMAL LOW (ref 3.5–5.0)
Albumin: 2.8 g/dL — ABNORMAL LOW (ref 3.5–5.0)
Anion gap: 14 (ref 5–15)
Anion gap: 14 (ref 5–15)
BUN: 48 mg/dL — ABNORMAL HIGH (ref 8–23)
BUN: 44 mg/dL — ABNORMAL HIGH (ref 8–23)
CO2: 27 mmol/L (ref 22–32)
CO2: 26 mmol/L (ref 22–32)
Calcium: 7.6 mg/dL — ABNORMAL LOW (ref 8.9–10.3)
Calcium: 7.6 mg/dL — ABNORMAL LOW (ref 8.9–10.3)
Chloride: 102 mmol/L (ref 98–111)
Chloride: 100 mmol/L (ref 98–111)
Creatinine, Ser: 2.03 mg/dL — ABNORMAL HIGH (ref 0.44–1.00)
Creatinine, Ser: 1.77 mg/dL — ABNORMAL HIGH (ref 0.44–1.00)
GFR, Estimated: 26 mL/min — ABNORMAL LOW (ref >60)
GFR, Estimated: 31 mL/min — ABNORMAL LOW (ref >60)
Glucose, Bld: 166 mg/dL — ABNORMAL HIGH (ref 70–99)
Glucose, Bld: 220 mg/dL — ABNORMAL HIGH (ref 70–99)
Phosphorus: 5.1 mg/dL — ABNORMAL HIGH (ref 2.5–4.6)
Phosphorus: 4.2 mg/dL (ref 2.5–4.6)
Potassium: 4.3 mmol/L (ref 3.5–5.1)
Potassium: 4.5 mmol/L (ref 3.5–5.1)
Sodium: 143 mmol/L (ref 135–145)
Sodium: 140 mmol/L (ref 135–145)

## 2022-05-30 LAB — GLUCOSE, CAPILLARY
Glucose-Capillary: 118 mg/dL — ABNORMAL HIGH (ref 70–99)
Glucose-Capillary: 120 mg/dL — ABNORMAL HIGH (ref 70–99)
Glucose-Capillary: 128 mg/dL — ABNORMAL HIGH (ref 70–99)
Glucose-Capillary: 148 mg/dL — ABNORMAL HIGH (ref 70–99)
Glucose-Capillary: 150 mg/dL — ABNORMAL HIGH (ref 70–99)
Glucose-Capillary: 151 mg/dL — ABNORMAL HIGH (ref 70–99)
Glucose-Capillary: 153 mg/dL — ABNORMAL HIGH (ref 70–99)
Glucose-Capillary: 159 mg/dL — ABNORMAL HIGH (ref 70–99)
Glucose-Capillary: 160 mg/dL — ABNORMAL HIGH (ref 70–99)
Glucose-Capillary: 163 mg/dL — ABNORMAL HIGH (ref 70–99)
Glucose-Capillary: 166 mg/dL — ABNORMAL HIGH (ref 70–99)
Glucose-Capillary: 167 mg/dL — ABNORMAL HIGH (ref 70–99)
Glucose-Capillary: 168 mg/dL — ABNORMAL HIGH (ref 70–99)
Glucose-Capillary: 170 mg/dL — ABNORMAL HIGH (ref 70–99)
Glucose-Capillary: 171 mg/dL — ABNORMAL HIGH (ref 70–99)
Glucose-Capillary: 193 mg/dL — ABNORMAL HIGH (ref 70–99)
Glucose-Capillary: 247 mg/dL — ABNORMAL HIGH (ref 70–99)
Glucose-Capillary: 287 mg/dL — ABNORMAL HIGH (ref 70–99)

## 2022-05-30 LAB — HEPATIC FUNCTION PANEL
ALT: 4999 U/L — ABNORMAL HIGH (ref 0–44)
AST: 9211 U/L — ABNORMAL HIGH (ref 15–41)
Albumin: 3 g/dL — ABNORMAL LOW (ref 3.5–5.0)
Alkaline Phosphatase: 153 U/L — ABNORMAL HIGH (ref 38–126)
Bilirubin, Direct: 0.5 mg/dL — ABNORMAL HIGH (ref 0.0–0.2)
Indirect Bilirubin: 1 mg/dL — ABNORMAL HIGH (ref 0.3–0.9)
Total Bilirubin: 1.5 mg/dL — ABNORMAL HIGH (ref 0.3–1.2)
Total Protein: 5.3 g/dL — ABNORMAL LOW (ref 6.5–8.1)

## 2022-05-30 LAB — HEPARIN LEVEL (UNFRACTIONATED)
Heparin Unfractionated: 0.12 IU/mL — ABNORMAL LOW (ref 0.30–0.70)
Heparin Unfractionated: 0.13 IU/mL — ABNORMAL LOW (ref 0.30–0.70)
Heparin Unfractionated: 0.18 IU/mL — ABNORMAL LOW (ref 0.30–0.70)

## 2022-05-30 LAB — CBC
HCT: 27.2 % — ABNORMAL LOW (ref 36.0–46.0)
Hemoglobin: 8.4 g/dL — ABNORMAL LOW (ref 12.0–15.0)
MCH: 24.5 pg — ABNORMAL LOW (ref 26.0–34.0)
MCHC: 30.9 g/dL (ref 30.0–36.0)
MCV: 79.3 fL — ABNORMAL LOW (ref 80.0–100.0)
Platelets: 148 10*3/uL — ABNORMAL LOW (ref 150–400)
RBC: 3.43 MIL/uL — ABNORMAL LOW (ref 3.87–5.11)
RDW: 17.3 % — ABNORMAL HIGH (ref 11.5–15.5)
WBC: 9.1 10*3/uL (ref 4.0–10.5)
nRBC: 1.2 % — ABNORMAL HIGH (ref 0.0–0.2)

## 2022-05-30 LAB — MAGNESIUM: Magnesium: 2.3 mg/dL (ref 1.7–2.4)

## 2022-05-30 LAB — VITAMIN B1: Vitamin B1 (Thiamine): 150.2 nmol/L (ref 66.5–200.0)

## 2022-05-30 MED ORDER — PRISMASOL BGK 4/2.5 32-4-2.5 MEQ/L REPLACEMENT SOLN: Status: DC

## 2022-05-30 MED ORDER — INSULIN DETEMIR 100 UNIT/ML ~~LOC~~ SOLN
20.0000 [IU] | Freq: Two times a day (BID) | SUBCUTANEOUS | Status: DC
  Administered 2022-05-30 (×2): 20 [IU] via SUBCUTANEOUS
  Filled 2022-05-30 (×4): qty 0.2

## 2022-05-30 MED ORDER — PRISMASOL BGK 4/2.5 32-4-2.5 MEQ/L EC SOLN: Status: DC

## 2022-05-30 MED ORDER — HEPARIN BOLUS VIA INFUSION
1500.0000 [IU] | Freq: Once | INTRAVENOUS | Status: AC
  Administered 2022-05-30: 1500 [IU] via INTRAVENOUS
  Filled 2022-05-30: qty 1500

## 2022-05-30 MED ORDER — INSULIN ASPART 100 UNIT/ML IJ SOLN
0.0000 [IU] | INTRAMUSCULAR | Status: DC
  Administered 2022-05-30: 8 [IU] via SUBCUTANEOUS
  Administered 2022-05-30: 3 [IU] via SUBCUTANEOUS
  Administered 2022-05-30: 5 [IU] via SUBCUTANEOUS
  Administered 2022-05-30: 2 [IU] via SUBCUTANEOUS
  Administered 2022-05-31 (×3): 11 [IU] via SUBCUTANEOUS
  Administered 2022-05-31 (×2): 8 [IU] via SUBCUTANEOUS

## 2022-05-30 MED ORDER — RENA-VITE PO TABS
1.0000 | ORAL_TABLET | Freq: Every day | ORAL | Status: DC
  Administered 2022-05-30 – 2022-06-24 (×25): 1
  Filled 2022-05-30 (×26): qty 1

## 2022-05-30 NOTE — Progress Notes (Signed)
[place] for heparin Indication: pulmonary embolus  Allergies  Allergen Reactions   Canagliflozin Other (See Comments)    recurrent vaginitis   Glucophage [Metformin] Other (See Comments)    GI intolerance   Niaspan [Niacin] Other (See Comments)    flushing    Patient Measurements: Height: 5' 5.98" (167.6 cm) Weight: 94.3 kg (207 lb 14.3 oz) IBW/kg (Calculated) : 59.25 Heparin Dosing Weight: 88kg  Vital Signs: Temp: 98.1 F (36.7 C) (07/31 0615) Temp Source: Esophageal (07/31 0400) BP: 111/59 (07/31 0600) Pulse Rate: 89 (07/31 0615)  Labs: Recent Labs    05/29/22 0229 05/29/22 0546 05/29/22 1002 05/29/22 1226 05/29/22 1801 05/29/22 1835 05/29/22 1930 05/30/22 0455  HGB 9.4*  --   --  10.0*  --   --   --  8.4*  HCT 32.8*  --   --  34.7*  --   --   --  27.2*  PLT 403*  --   --  260  --   --   --  148*  APTT  --   --  40*  --   --   --   --   --   LABPROT  --   --  23.3*  --   --   --   --   --   INR  --   --  2.1*  --   --   --   --   --   HEPARINUNFRC  --   --   --   --   --  <0.10* <0.10* 0.12*  CREATININE 2.22*   < >  --  2.87* 2.79*  --   --  2.03*   < > = values in this interval not displayed.     Estimated Creatinine Clearance: 31.1 mL/min (A) (by C-G formula based on SCr of 2.03 mg/dL (H)).   Medical History: Past Medical History:  Diagnosis Date   Allergy    Atherosclerotic heart disease of native coronary artery without angina pectoris 01/13/2020   Blood in stool    Carotid artery occlusion 08/13/2009   Carotid stenosis    Cervical lymphadenopathy 10/31/2019   CLL (chronic lymphocytic leukemia) ([place]) 70/17/7939   Complication of anesthesia    Coronary artery calcification seen on CT scan 06/01/2020   Diabetes mellitus without complication ([place])    Diabetic peripheral neuropathy associated with type 2 diabetes mellitus ([place]) 06/08/2009   Elevated liver function tests    Essential hypertension 11/08/2019    Fatty liver 09/06/2012   Goals of care, counseling/discussion 10/28/2019   Hardening of the aorta (main artery of the heart) ([place]) 01/13/2020   History of adenomatous polyp of colon 03/16/2021   Hyperglycemia due to type 2 diabetes mellitus ([place]) 03/09/2021   Hyperlipidemia    Hypertension    Hypertriglyceridemia 03/16/2021   Long term (current) use of insulin ([place]) 03/09/2021   Lumbar radiculopathy 11/30/2021   Lymphadenopathy, abdominal 10/28/2019   Malignant lymphoma, small lymphocytic ([place]) 11/13/2019   Mixed dyslipidemia 06/01/2020   Obesity 04/19/2012   Personal history of COVID-19 11/13/2019   Pneumonia due to COVID-19 virus 11/08/2019   PONV (postoperative nausea and vomiting)    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 ([place]) 12/30/2019   T2DM (type 2 diabetes mellitus) ([place]) 11/08/2019   Thyroid nodule 03/16/2021   Type 2 diabetes mellitus with unspecified complications ([place]) 03/00/9233   Assessment: 51 yoF admitted with meningitis from OSH. Pt with ongoing  fevers and worsening clinical status overnight, RV severely down on beside ECHO so concerns for acute PE. Pt in shock, s/p alteplase infusion and heparin infusion started with no bolus. Will target lower heparin level goal x24 hours.  Heparin level of 0.12 is subtherapeutic on heparin 1300 units/hr. No issues with IV infusion or access per RN. Hgb 8.4 - dec. Plt 148 - decrease. No bleeding noted per RN.   Goal of Therapy:  Heparin level 0.3-0.5 units/ml Monitor platelets by anticoagulation protocol: Yes   Plan:  Increase heparin infusion to 1500 units/hr  Check 6 hr heparin level  Monitor daily heparin level and CBC Continue to monitor H&H     Cristela Felt, PharmD, BCPS Clinical Pharmacist 05/30/2022 6:27 AM

## 2022-05-30 NOTE — Progress Notes (Signed)
Subjective: Now on heparin for PE. Exam unchanged.  Ct head overnight:  1. Sulci are poorly seen over the posterior parietal lobes and occipital lobes. This is consistent with abnormal CSF signal and suspected meningitis. 2. No discrete extra-axial fluid collections. 3. Stable atrophy and white matter disease. 4. New right greater than left mastoid effusions.  Imaging personally reviewed; I agree with above interpretation  Exam: Vitals:   05/30/22 1945 05/30/22 2000  BP:  (!) 108/53  Pulse:  91  Resp:    Temp:  99.1 F (37.3 C)  SpO2: 99% 98%   Examined on fentanyl, sedation not paused today 2/2 recent hemodynamic instability in setting of PE. Patient also received recent bolus versed. Gen: Intubated  Neuro: MS: Does not open eyes or follow commands CN: Pupils 37m sluggishly reactive, (-) corneals/oculocephalics/cough/gag on sedation Motor: No movement to noxious stimulation Sensory: As above   Pertinent Labs: Mildly elevated LFTs  Impression: 66year old female who presented with progressive weakness and tremors and then developed profound encephalopathy with pleocytosis of 55 WBC, predominantly neutrophilic.  Given concern for meningitis causing spinal cord compromise in the setting of severe cervical stenosis, I started her on steroids with the hope of reducing the inflammation contributing to this compromise, but on repeat MRI, it does not look as severe. With layering protenaceous material on MRI in the setting of fevers and consistent clinical syndrome, do feel this is infectious meningoencephalitis and given recent hand-foot-mouth think that coxsackie is a likely culprit.   She has continued to have some twitching activity, my suspicion is this is similar to what was seen earlier in the hospitalization, but will perform continue EEG overnight again before d/c'ing keppra.   Recommendations: 1) keppra 500 mg twice daily given renal function, consider discontinuing if  EEG is again negative 2) overnight EEG 3) CT head 4) appreciate ID recs 5) neurology will follow  This patient is critically ill and at significant risk of neurological worsening, death and care requires constant monitoring of vital signs, hemodynamics,respiratory and cardiac monitoring, neurological assessment, discussion with family, other specialists and medical decision making of high complexity. I spent 45 minutes of neurocritical care time  in the care of  this patient. This was time spent independent of any time provided by nurse practitioner or PA.  CSu Monks MD [facility] [phone] If 7pm- 7am, please page neurology on call as listed in [place]

## 2022-05-30 NOTE — Progress Notes (Signed)
[facility] Progress Note    66 y.o. year-old w/ hx HTN, hL, DM, CAD, carotid stenosis and hx NHL last chemoRx June 2023 presented to OSH on 7/21 for LUE tremors w/ recent hand-foot-and mouth disease. Work-up showed neg MRI of brain but MRI of C-spine showed sig cord compression at C4-5. Decompensated w/ high givers and AMS, concerning for meningitis and was moved to ICU. LP showed wbc 55 and CT showed multifocal PNA. Pt transferred to [facility] on 7/24. On 7/25 pt become poorly responsiveness and was intubated and moved to ICU. Fevers continued and pt developed hypotension/ shock on 7/29 and started on vaso and levo gtts.  Creat 0.8 on admit and cont to rise with hyperkalemia as well -> CRRT.    Assessment/ Plan:   AKI - b/l creat 0.8 on admission. Creat rising now in setting of new onset shock. Pt admitted for viral meningoencephalitis complicated by critical cervical myopathy and now resp failure w/ multifocal PNA. Pt has lymphoma receiving active chemoRx (rituximab) w/ last dose in June 2023. AKI due to shock which started in last 24 hrs abruptly. CCM suspecting acute PE, s/p TPA and now on IV heparin. K+ up to 6.8 -> CCRT 7/30 w/ all 2K fluids for now. No signs of renal recovery Seen on CRRT On 2K fluids  500/400/1500 pre/post/qd Keeping even but now off Levophed and will gently challenge with 50m/hr. Up 14L during this admission  Will change to 4K baths and monitor  Volume - mod edema on exam, I/O + 14 L since admit, wt's up 10 kg. Will gently challenge with 590mhr as tolerated o/w if she doesn't tolerate will keep even. Shock - unclear etiology, as above, on vasopressor support H/o lymphoma - getting active chemoRx w/ last dose rituxan in June 2023 AHRF - on vent, multifocal PNA on broad spec IV abx per CCM AMS - in setting of viral meningoencephalitis  Cervical myelopathy  DM2 - on insulin  Subjective:   Spouse and sister bedside; off Levo now with SBP in 110's.    Objective:   BP (!) 112/56 (BP Location: Right Arm)   Pulse 90   Temp 98.8 F (37.1 C) (Esophageal)   Resp (!) 35   Ht 5' 5.98" (1.676 m)   Wt 94.3 kg   SpO2 96%   BMI 33.58 kg/m   Intake/Output Summary (Last 24 hours) at 05/30/2022 [id number] data filed at 05/30/2022 0700 Gross per 24 hour  Intake 4093.42 ml  Output 2916 ml  Net 1177.42 ml   Weight change:   Physical Exam: Gen on vent, sedated No rash, cyanosis or gangrene Sclera anicteric, throat w/ ETT No jvd or bruits Chest clear anterior/ latera RRR no RG Abd soft ntnd no mass or ascites +bs GU foley draining clear light amber urine Ext 1+ bilat UE > LE edema, no wounds or ulcers Neuro is on vent, sedated  Imaging: CT HEAD WO CONTRAST (5MM)  Result Date: 05/29/2022 CLINICAL DATA:  Mental status changes. Persistent or worsening. Suspected viral meningitis. EXAM: CT HEAD WITHOUT CONTRAST TECHNIQUE: Contiguous axial images were obtained from the base of the skull through the vertex without intravenous contrast. RADIATION DOSE REDUCTION: This exam was performed according to the departmental dose-optimization program which includes automated exposure control, adjustment of the mA and/or kV according to patient size and/or use of iterative reconstruction technique. COMPARISON:  CT head without contrast 05/20/2022. MR head without and with contrast 05/25/2022 FINDINGS: Brain: Sulci are poorly seen over  the posterior parietal lobes and occipital lobes. Mild atrophy and white matter changes are present. Ventricles are stable in size. No discrete extra-axial fluid collections are present. The brainstem and cerebellum are within normal limits. Vascular: Atherosclerotic calcifications are again seen within the cavernous internal carotid arteries. No hyperdense vessel present. Skull: Calvarium is intact. No focal lytic or blastic lesions are present. No significant extracranial soft tissue lesion is present. Sinuses/Orbits: Right greater  than left mastoid effusions are new. No significant middle ear effusions are present. The paranasal sinuses and mastoid air cells are otherwise clear. The globes and orbits are within normal limits. IMPRESSION: 1. Sulci are poorly seen over the posterior parietal lobes and occipital lobes. This is consistent with abnormal CSF signal and suspected meningitis. 2. No discrete extra-axial fluid collections. 3. Stable atrophy and white matter disease. 4. New right greater than left mastoid effusions. Electronically Signed   By: San Morelle M.D.   On: 05/29/2022 17:33   DG Chest Port 1 View  Result Date: 05/29/2022 CLINICAL DATA:  Evaluate line placement. EXAM: PORTABLE CHEST 1 VIEW COMPARISON:  May 29, 2022 FINDINGS: The ETT is in good position. A right central line terminates in the central SVC. A left PICC line terminates in the central SVC. No pneumothorax. Bilateral patchy pulmonary infiltrates have worsened in the interval, particularly in the right mid lung. The cardiomediastinal silhouette is stable. No other acute abnormalities. IMPRESSION: 1. Support apparatus as above. Specifically, the new right central line is in good position with no pneumothorax. 2. Worsening bilateral pulmonary infiltrates, particularly in the right mid lung. Electronically Signed   By: Dorise Bullion III M.D.   On: 05/29/2022 09:50   VAS [place] LOWER EXTREMITY VENOUS (DVT)  Result Date: 05/29/2022  Lower Venous DVT Study Patient Name:  KHALIAH BARNICK  Date of Exam:   05/28/2022 Medical Rec #: [id number]       Accession #:    [id number] Date of Birth: 12/31/1955       Patient Gender: F Patient Age:   1 years Exam Location:  [facility] Procedure:      VAS [place] LOWER EXTREMITY VENOUS (DVT) Referring Phys: Ina Homes --------------------------------------------------------------------------------  Indications: Edema.  Risk Factors: Ventilation. Limitations: Ventilation. Enlarged bladder. Positioning. Comparison Study: No  prior study Performing Technologist: Sharion Dove RVS  Examination Guidelines: A complete evaluation includes B-mode imaging, spectral Doppler, color Doppler, and power Doppler as needed of all accessible portions of each vessel. Bilateral testing is considered an integral part of a complete examination. Limited examinations for reoccurring indications may be performed as noted. The reflux portion of the exam is performed with the patient in reverse Trendelenburg.  +---------+---------------+---------+-----------+----------+--------------+ RIGHT    CompressibilityPhasicitySpontaneityPropertiesThrombus Aging +---------+---------------+---------+-----------+----------+--------------+ CFV      Full           No       No                                  +---------+---------------+---------+-----------+----------+--------------+ SFJ      Partial                                                     +---------+---------------+---------+-----------+----------+--------------+ FV Prox  Full  No       No                                  +---------+---------------+---------+-----------+----------+--------------+ FV Mid   Full           No       No                                  +---------+---------------+---------+-----------+----------+--------------+ FV DistalFull           No       No                                  +---------+---------------+---------+-----------+----------+--------------+ PFV      Full           No       No                                  +---------+---------------+---------+-----------+----------+--------------+ POP      Full           No       No                                  +---------+---------------+---------+-----------+----------+--------------+ PTV      Full           No       No                                  +---------+---------------+---------+-----------+----------+--------------+ PERO     Full           No        No                                  +---------+---------------+---------+-----------+----------+--------------+ Gastroc                 No       No                                  +---------+---------------+---------+-----------+----------+--------------+                                                                      +---------+---------------+---------+-----------+----------+--------------+   +---------+---------------+---------+-----------+----------+--------------+ LEFT     CompressibilityPhasicitySpontaneityPropertiesThrombus Aging +---------+---------------+---------+-----------+----------+--------------+ CFV      Full           No       No                                  +---------+---------------+---------+-----------+----------+--------------+ SFJ      Partial                                                     +---------+---------------+---------+-----------+----------+--------------+  FV Prox  Full           No       No                                  +---------+---------------+---------+-----------+----------+--------------+ FV Mid   Full           No       No                                  +---------+---------------+---------+-----------+----------+--------------+ FV DistalFull           No       No                                  +---------+---------------+---------+-----------+----------+--------------+ PFV      Full                                                        +---------+---------------+---------+-----------+----------+--------------+ POP      Full           No       No                                  +---------+---------------+---------+-----------+----------+--------------+ PTV      Full                                                        +---------+---------------+---------+-----------+----------+--------------+ PERO     Full                                                         +---------+---------------+---------+-----------+----------+--------------+ EIV mid  Partial                                                     +---------+---------------+---------+-----------+----------+--------------+     Summary: RIGHT: - Vessels compress, however, they are dilated and waveforms are continuous. IVC is patent as is distal right common iliac vein.  LEFT: - Vessels compress, however, they are dilated and waveforms are continuous. Mid external iliac vein is partially compressible with no color flow noted.  *See table(s) above for measurements and observations. Electronically signed by Jamelle Haring on 05/29/2022 at 9:23:40 AM.    Final    ECHOCARDIOGRAM LIMITED  Result Date: 05/29/2022    ECHOCARDIOGRAM LIMITED REPORT   Patient Name:   RONNELL MAKAREWICZ Date of Exam: 05/29/2022 Medical Rec #:  [id number]      Height:       66.0 in Accession #:    [id number]  Weight:       195.5 lb Date of Birth:  [birth year]      BSA:          1.981 m Patient Age:    47 years       BP:           153/61 mmHg Patient Gender: F              HR:           114 bpm. Exam Location:  Inpatient Procedure: Limited Echo, Color Doppler and Cardiac Doppler STAT ECHO Indications:    Acute Ischemic Heart Disease  History:        Patient has prior history of Echocardiogram examinations, most                 recent 05/28/2022. Risk Factors:Hypertension, Diabetes and                 Dyslipidemia.  Sonographer:    Raquel Sarna Senior RDCS Referring Phys: [id number] [place]  1. Left ventricular ejection fraction, by estimation, is 70 to 75%. The left ventricle has hyperdynamic function. Left ventricular endocardial border not optimally defined to evaluate regional wall motion. Left ventricular diastolic function could not be evaluated. There is the interventricular septum is flattened in systole and diastole, consistent with right ventricular pressure and volume overload.  2. There is a suggestion of McConnell's sign,  which is seen with acute cor pulmonale (e.g. pulmonary embolism, acue respiratory failure, etc). Right ventricular systolic function is moderately reduced. The right ventricular size is mildly enlarged. There is moderately elevated pulmonary artery systolic pressure. The estimated right ventricular systolic pressure is 75.1 mmHg.  3. Right atrial size was moderately dilated.  4. No evidence of mitral valve regurgitation. Moderate mitral annular calcification.  5. Tricuspid valve regurgitation is moderate.  6. The aortic valve was not well visualized. Aortic valve regurgitation is not visualized. No aortic stenosis is present.  7. The inferior vena cava is dilated in size with <50% respiratory variability, suggesting right atrial pressure of 15 mmHg. Comparison(s): Prior images reviewed side by side. The left ventricular function is unchanged. There is marked deterioration in right ventricular function. The inferior vena cava is now plethoric. Findings are consistent with acute right heart failure, possibly due to acute pulmonary embolism or other cause of abrupt and severe increase in pulmonary vascular resistance. FINDINGS  Left Ventricle: Left ventricular ejection fraction, by estimation, is 70 to 75%. The left ventricle has hyperdynamic function. Left ventricular endocardial border not optimally defined to evaluate regional wall motion. The left ventricular internal cavity size was small. There is no left ventricular hypertrophy. The interventricular septum is flattened in systole and diastole, consistent with right ventricular pressure and volume overload. Left ventricular diastolic function could not be evaluated. Right Ventricle: There is a suggestion of McConnell's sign, which is seen with acute cor pulmonale (e.g. pulmonary embolism, acue respiratory failure, etc). The right ventricular size is mildly enlarged. Right ventricular systolic function is moderately reduced. There is moderately elevated pulmonary  artery systolic pressure. The tricuspid regurgitant velocity is 2.99 m/s, and with an assumed right atrial pressure of 15 mmHg, the estimated right ventricular systolic pressure is 02.5 mmHg. Left Atrium: Left atrial size was normal in size. Right Atrium: Right atrial size was moderately dilated. Pericardium: There is no evidence of pericardial effusion. Mitral Valve: Moderate mitral annular calcification. Tricuspid Valve: Tricuspid valve regurgitation is moderate. Aortic Valve: The aortic valve was  not well visualized. Aortic valve regurgitation is not visualized. No aortic stenosis is present. Pulmonic Valve: The pulmonic valve was not well visualized. Venous: The inferior vena cava is dilated in size with less than 50% respiratory variability, suggesting right atrial pressure of 15 mmHg. IAS/Shunts: The atrial septum is grossly normal. RIGHT VENTRICLE RV S prime:     8.81 cm/s TAPSE (M-mode): 1.4 cm AORTIC VALVE LVOT Vmax:   94.80 cm/s LVOT Vmean:  69.700 cm/s LVOT VTI:    0.105 m TRICUSPID VALVE TR Peak grad:   35.8 mmHg TR Vmax:        299.00 cm/s  SHUNTS Systemic VTI: 0.10 m Dani Gobble Croitoru MD Electronically signed by Sanda Klein MD Signature Date/Time: 05/29/2022/8:41:23 AM    Final    US Abdomen Limited RUQ (LIVER/GB)  Result Date: 05/29/2022 CLINICAL DATA:  67 year old female with abnormal LFTs. EXAM: ULTRASOUND ABDOMEN LIMITED RIGHT UPPER QUADRANT COMPARISON:  CT Abdomen and Pelvis 09/03/2020. FINDINGS: Gallbladder: Sludge and shadowing echogenic gallstones, individually up to 10 mm (image 12). Mild gallbladder wall thickening of up to 4 mm with trace pericholecystic fluid. Gallbladder appears partially contracted. No sonographic Murphy sign elicited. Common bile duct: Diameter: 2-3 mm, normal. Liver: Small volume perihepatic ascites. No discrete liver lesion, but nodular liver contour such as on image 29. Portal vein is patent on color Doppler imaging with normal direction of blood flow towards the  liver. Other: Right pleural effusion is visible (image 35). Negative visible right kidney. IMPRESSION: 1. Nodular liver contour and small volume ascites suspicious for Cirrhosis. No discrete liver lesion by ultrasound. 2. Gallstones with sludge and mild gallbladder wall thickening. However, no sonographic Murphy sign elicited. Favor these changes are reactive secondary to #1 rather than due to acute cholecystitis. 3. No evidence of bile duct obstruction. 4. Right pleural effusion. Electronically Signed   By: Genevie Ann M.D.   On: 05/29/2022 08:00   DG CHEST PORT 1 VIEW  Result Date: 05/29/2022 CLINICAL DATA:  66 year old female respiratory distress, intubated. EXAM: PORTABLE CHEST 1 VIEW COMPARISON:  Portable chest 05/28/2022 and earlier. FINDINGS: Portable AP semi upright view at 0412 hours. Stable lines and tubes. Less rotated today. Confluent and rounded 4.5 cm right perihilar opacity, although right lung ventilation appears improved since 05/27/2022. Patchy and streaky multifocal left perihilar opacity is stable. No pneumothorax or pleural effusion. No areas of worsening ventilation. Stable cardiac size and mediastinal contours. Paucity of bowel gas in the upper abdomen. Prior cervical ACDF. No acute osseous abnormality identified. IMPRESSION: 1.  Stable lines and tubes. 2. Bilateral pneumonia suspected, with right lung ventilation mildly improved since 05/27/2022. 3. No new cardiopulmonary abnormality. Electronically Signed   By: Genevie Ann M.D.   On: 05/29/2022 05:07   ECHOCARDIOGRAM LIMITED BUBBLE STUDY  Result Date: 05/28/2022    ECHOCARDIOGRAM LIMITED REPORT   Patient Name:   SULTANA TIERNEY Date of Exam: 05/28/2022 Medical Rec #:  [id number]      Height:       66.0 in Accession #:    [id number]     Weight:       195.5 lb Date of Birth:  05/13/1956      BSA:          1.981 m Patient Age:    21 years       BP:           98/55 mmHg Patient Gender: F  HR:           101 bpm. Exam Location:  Inpatient  Procedure: Limited Echo, Color Doppler, Cardiac Doppler and Saline Contrast            Bubble Study Indications:    patent foramen ovale  History:        Patient has prior history of Echocardiogram examinations, most                 recent 05/24/2022. Sepsis. Meningitis. Pneumonia.; Risk                 Factors:Diabetes, Hypertension and Dyslipidemia.  Sonographer:    Johny Chess RDCS Referring Phys: [id number] Candee Furbish  Sonographer Comments: Image acquisition challenging due to respiratory motion. IMPRESSIONS  1. Left ventricular ejection fraction, by estimation, is 70 to 75%. The left ventricle has hyperdynamic function. The left ventricle has no regional wall motion abnormalities. Left ventricular diastolic function could not be evaluated.  2. Right ventricular systolic function is normal. The right ventricular size is normal.  3. The mitral valve is normal in structure. No evidence of mitral valve regurgitation.  4. Aortic valve regurgitation is not visualized. No aortic stenosis is present.  5. The inferior vena cava is normal in size with greater than 50% respiratory variability, suggesting right atrial pressure of 3 mmHg.  6. Agitated saline contrast bubble study was negative, with no evidence of any interatrial shunt. Comparison(s): No significant change from prior study. Prior images reviewed side by side. FINDINGS  Left Ventricle: Left ventricular ejection fraction, by estimation, is 70 to 75%. The left ventricle has hyperdynamic function. The left ventricle has no regional wall motion abnormalities. The left ventricular internal cavity size was normal in size. There is no left ventricular hypertrophy. Left ventricular diastolic function could not be evaluated. Right Ventricle: The right ventricular size is normal. No increase in right ventricular wall thickness. Right ventricular systolic function is normal. Left Atrium: Left atrial size was normal in size. Right Atrium: Right atrial size was  normal in size. Mitral Valve: The mitral valve is normal in structure. Mild mitral annular calcification. Tricuspid Valve: The tricuspid valve is grossly normal. Aortic Valve: Aortic valve regurgitation is not visualized. No aortic stenosis is present. Pulmonic Valve: The pulmonic valve was not well visualized. Venous: The inferior vena cava is normal in size with greater than 50% respiratory variability, suggesting right atrial pressure of 3 mmHg. IAS/Shunts: Agitated saline contrast was given intravenously to evaluate for intracardiac shunting. Agitated saline contrast bubble study was negative, with no evidence of any interatrial shunt. IVC IVC diam: 1.80 cm Mihai Croitoru MD Electronically signed by Sanda Klein MD Signature Date/Time: 05/28/2022/1:02:45 PM    Final     Labs: BMET Recent Labs  Lab 05/25/22 7989 05/25/22 2119 05/26/22 0221 05/27/22 4174 05/27/22 1951 05/28/22 0814 05/29/22 0229 05/29/22 0546 05/29/22 1226 05/29/22 1801 05/30/22 0455  NA 137   < > 137 140 146* 143 139 128* 144 146* 143  K 3.6   < > 3.8 3.7 3.6 4.1 6.8* 5.3* 5.9* 4.7 4.3  CL 103  --  104 106 106 105 100 91* 102 104 102  CO2 26  --  '27 27 29 '$ 32 '30 27 26 26 27  '$ GLUCOSE 314*  --  243* 247* 258* 197* 484* 830* 342* 257* 166*  BUN 14  --  20 24* 29* 35* 56* 52* 69* 70* 48*  CREATININE 0.83  --  0.80 0.81 0.91  1.01* 2.22* 2.18* 2.87* 2.79* 2.03*  CALCIUM 8.0*  --  8.5* 8.0* 8.2* 7.8* 7.7* 6.6* 7.3* 7.1* 7.6*  PHOS 2.5  --  2.2* 2.7  --  3.3 8.5*  --   --  6.9* 5.1*   < > = values in this interval not displayed.   CBC Recent Labs  Lab 05/28/22 0324 05/29/22 0229 05/29/22 1226 05/30/22 0455  WBC 8.1 20.5* 20.2* 9.1  HGB 8.9* 9.4* 10.0* 8.4*  HCT 28.4* 32.8* 34.7* 27.2*  MCV 80.5 87.5 83.8 79.3*  PLT 271 403* 260 148*    Medications:     Chlorhexidine Gluconate Cloth  6 each Topical Q0600   Chlorhexidine Gluconate Cloth  6 each Topical Daily   docusate  100 mg Per Tube BID   insulin aspart  10  Units Intravenous Once   ipratropium-albuterol  3 mL Nebulization Q4H   methylPREDNISolone (SOLU-MEDROL) injection  125 mg Intravenous Q12H   mouth rinse  15 mL Mouth Rinse Q2H   pantoprazole sodium  40 mg Per Tube Daily   polyethylene glycol  17 g Per Tube Daily   sodium chloride flush  10-40 mL Intracatheter Q12H   sodium chloride flush  10-40 mL Intracatheter Q12H      Otelia Santee, MD 05/30/2022, 9:58 AM

## 2022-05-30 NOTE — Consult Note (Addendum)
[place]  [phone] [phone] [place]  Reason for Consultation: Small lymphocytic B cell non-Hodgkin's lymphoma  HPI: Ms. Cousar is a 66 year old female with a past medical history significant for small lymphocytic B cell non-Hodgkin's lymphoma, hypertension, hyperlipidemia, type 2 diabetes mellitus, CAD, carotid stenosis, reported recent hand-foot-and-mouth disease.  She was admitted to [facility] from 7/21 through 7/24 due to to ataxia and UTI.  Transferred to [facility] on 7/24.  A UA done at [facility] showed negative nitrite, 2+ WBC, 3+ bacteria.  CSF analysis done at [facility] showed 55 WBCs with 84% neutrophils, 2 RBCs, glucose 147, total protein 107.  Neurology work-up including MRI and EEG negative.  MRI/MRA negative for acute infarct, 13 mm focus in the left frontal lobe representing artifact versus small calcified meningioma, chronic small vessel ischemic changes, cerebral atrophy, high-grade stenosis in bilateral ICA, focal stenosis at left ACA and intracranial right vertebral artery.  MRI of C-spine showed C4-5 cord compression from advanced disc degeneration with posterior endplate ridging.  Neurology at [facility] recommended neurosurgical evaluation.  Upon arrival to [facility], she was somnolent.  Daughter provided history and indicated that grandson recently had hand-foot-and-mouth disease.  The patient fully recovered from this illness started to experience left upper extremity weakness and tremors and started to have fevers.  Since admission to [facility], she has fevers and there was concern for meningitis.  She has been started on multiple medications including vancomycin, Rocephin, ampicillin, acyclovir.  She developed decreased responsiveness and hypoxemia on nonrebreather.  She was transferred to the ICU for emergent intubation.  Has developed multiorgan failure with LFTs which are trending upward.  On pressors.  Was  started on CRRT.  ID following due viral meningoencephalitis.  Respiratory panel positive for rhinovirus/enterovirus.  She has suspected statin pulmonary embolism and received tPA with improvement in her hemodynamics.  Currently on IV heparin.  For her small lymphocytic B cell non-Hodgkin's lymphoma, she is currently receiving maintenance Rituxan every 3 months.  This was last given on 04/05/2022.  Intubated, sedated.  Continues to have low-grade fevers.  Pressors were stopped just prior to my visit.  Remains on CRRT.  Has a small amount of urine output.  Family numbers at bedside.  Medical oncology was asked see the patient make recommendations regarding her small lymphocytic B cell non-Hodgkin's lymphoma.   Past Medical History:  Diagnosis Date   Allergy    Atherosclerotic heart disease of native coronary artery without angina pectoris 01/13/2020   Blood in stool    Carotid artery occlusion 08/13/2009   Carotid stenosis    Cervical lymphadenopathy 10/31/2019   CLL (chronic lymphocytic leukemia) ([place]) 59/29/2446   Complication of anesthesia    Coronary artery calcification seen on CT scan 06/01/2020   Diabetes mellitus without complication ([place])    Diabetic peripheral neuropathy associated with type 2 diabetes mellitus ([place]) 06/08/2009   Elevated liver function tests    Essential hypertension 11/08/2019   Fatty liver 09/06/2012   Goals of care, counseling/discussion 10/28/2019   Hardening of the aorta (main artery of the heart) ([place]) 01/13/2020   History of adenomatous polyp of colon 03/16/2021   Hyperglycemia due to type 2 diabetes mellitus ([place]) 03/09/2021   Hyperlipidemia    Hypertension    Hypertriglyceridemia 03/16/2021   Long term (current) use of insulin ([place]) 03/09/2021   Lumbar radiculopathy 11/30/2021   Lymphadenopathy, abdominal 10/28/2019   Malignant lymphoma, small lymphocytic ([place]) 11/13/2019   Mixed  dyslipidemia 06/01/2020   Obesity 04/19/2012   Personal history of COVID-19  11/13/2019   Pneumonia due to COVID-19 virus 11/08/2019   PONV (postoperative nausea and vomiting)    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 ([place]) 12/30/2019   T2DM (type 2 diabetes mellitus) ([place]) 11/08/2019   Thyroid nodule 03/16/2021   Type 2 diabetes mellitus with unspecified complications ([place]) 08/65/7846  :   Past Surgical History:  Procedure Laterality Date   BREAST BIOPSY Right    needle biopsy   COLONOSCOPY     POLYPECTOMY     Ruptured disc    :   Current Facility-Administered Medications  Medication Dose Route Frequency Provider Last Rate Last Admin   0.9 %  sodium chloride infusion   Intra-arterial PRN Lestine Mount, PA-C   Paused at 05/29/22 0139   acetaminophen (TYLENOL) 160 MG/5ML solution 650 mg  650 mg Per Tube Q6H PRN Candee Furbish, MD   650 mg at 05/29/22 0640   alteplase (CATHFLO ACTIVASE) injection 2 mg  2 mg Intracatheter Once PRN Roney Jaffe, MD       Chlorhexidine Gluconate Cloth 2 % PADS 6 each  6 each Topical Q0600 Margaretha Seeds, MD   6 each at 05/30/22 0538   Chlorhexidine Gluconate Cloth 2 % PADS 6 each  6 each Topical Daily Margaretha Seeds, MD   6 each at 05/29/22 2302   docusate (COLACE) 50 MG/5ML liquid 100 mg  100 mg Per Tube BID Margaretha Seeds, MD   100 mg at 05/29/22 2215   feeding supplement (VITAL AF 1.2 CAL) liquid 1,000 mL  1,000 mL Per Tube Continuous Candee Furbish, MD   Stopped at 05/29/22 0701   fentaNYL 2535mg in NS 2560m(1081mml) infusion-PREMIX  0-400 mcg/hr Intravenous Continuous Nallamothu, RavHewitt ShortsD 25 mL/hr at 05/30/22 0759 250 mcg/hr at 05/30/22 0759   heparin ADULT infusion 100 units/mL (25000 units/250m88m1,500 Units/hr Intravenous Continuous BarrHenri MedalH 15 mL/hr at 05/30/22 0633 1,500 Units/hr at 05/30/22 [id number] injection 1,000-6,000 Units  1,000-6,000 Units CRRT PRN SmitCandee Furbish   2,400 Units at 05/29/22 [id number] injection 1,000-6,000 Units  1,000-6,000  Units CRRT PRN ScheRoney Jaffe       hydrALAZINE (APRESOLINE) injection 10 mg  10 mg Intravenous Q6H PRN ElliMargaretha Seeds       insulin aspart (novoLOG) injection 10 Units  10 Units Intravenous Once SmitMauri [place]       insulin regular, human (MYXREDLIN) 100 units/ 100 mL infusion   Intravenous Continuous GonzCollier Bullock 9 mL/hr at 05/30/22 0704 9 Units/hr at 05/30/22 0704   ipratropium-albuterol (DUONEB) 0.5-2.5 (3) MG/3ML nebulizer solution 3 mL  3 mL Nebulization Q4H SmitCandee Furbish   3 mL at 05/30/22 0745   levETIRAcetam (KEPPRA) IVPB 500 mg/100 mL premix  500 mg Intravenous Q12H KirkGreta Doom       LORazepam (ATIVAN) injection 1 mg  1 mg Intravenous Q1H PRN SmitCandee Furbish   1 mg at 05/29/22 2347   meropenem (MERREM) 1 g in sodium chloride 0.9 % 100 mL IVPB  1 g Intravenous Q8H BitoEinar GradH 200 mL/hr at 05/30/22 0600 Infusion Verify at 05/30/22 0600   methylPREDNISolone sodium succinate (SOLU-MEDROL) 125 mg/2 mL injection 125 mg  125 mg Intravenous Q12H SmitCandee Furbish   125 mg at 05/30/22 [phone]  midazolam (VERSED) injection 1 mg  1 mg Intravenous Q15 min PRN Candee Furbish, MD       midazolam (VERSED) injection 1 mg  1 mg Intravenous Q2H PRN Candee Furbish, MD       norepinephrine (LEVOPHED) 16 mg in 281m premix infusion  0-60 mcg/min Intravenous Titrated SCandee Furbish MD   Stopped at 05/30/22 0300   Oral care mouth rinse  15 mL Mouth Rinse Q2H SCandee Furbish MD   15 mL at 05/30/22 0539   Oral care mouth rinse  15 mL Mouth Rinse PRN SCandee Furbish MD       pantoprazole sodium (PROTONIX) 40 mg/20 mL oral suspension 40 mg  40 mg Per Tube Daily EMargaretha Seeds MD   40 mg at 05/29/22 0935   polyethylene glycol (MIRALAX / GLYCOLAX) packet 17 g  17 g Per Tube Daily EMargaretha Seeds MD   17 g at 05/27/22 00355  prismasol BGK 2/2.5 dialysis solution   CRRT Continuous SRoney Jaffe MD 1,500 mL/hr at 05/30/22 0659 New Bag at 05/30/22  0659   prismasol BGK 2/2.5 replacement solution   CRRT Continuous SRoney Jaffe MD 500 mL/hr at 05/30/22 0318 New Bag at 05/30/22 0318   prismasol BGK 2/2.5 replacement solution   CRRT Continuous SRoney Jaffe MD 400 mL/hr at 05/30/22 0602 New Bag at 05/30/22 0602   sodium bicarbonate 150 mEq in sterile water 1,150 mL infusion   Intravenous Continuous SCandee Furbish MD 100 mL/hr at 05/30/22 0600 Infusion Verify at 05/30/22 0600   sodium chloride 0.9 % primer fluid for CRRT   CRRT PRN SRoney Jaffe MD       sodium chloride flush (NS) 0.9 % injection 10-40 mL  10-40 mL Intracatheter Q12H EMargaretha Seeds MD   10 mL at 05/29/22 2217   sodium chloride flush (NS) 0.9 % injection 10-40 mL  10-40 mL Intracatheter PRN EMargaretha Seeds MD       sodium chloride flush (NS) 0.9 % injection 10-40 mL  10-40 mL Intracatheter Q12H SCandee Furbish MD   10 mL at 05/29/22 2216   sodium chloride flush (NS) 0.9 % injection 10-40 mL  10-40 mL Intracatheter PRN SCandee Furbish MD       vancomycin (VANCOREADY) IVPB 1250 mg/250 mL  1,250 mg Intravenous Q24H BEinar Grad RPH       vasopressin (PITRESSIN) 20 Units in sodium chloride 0.9 % 100 mL infusion-*FOR SHOCK*  0-0.03 Units/min Intravenous Continuous SCandee Furbish MD 6 mL/hr at 05/30/22 0759 0.02 Units/min at 05/30/22 0759      Allergies  Allergen Reactions   Canagliflozin Other (See Comments)    recurrent vaginitis   Glucophage [Metformin] Other (See Comments)    GI intolerance   Niaspan [Niacin] Other (See Comments)    flushing  :  Family History  Problem Relation Age of Onset   Colon cancer Neg Hx    Esophageal cancer Neg Hx    Rectal cancer Neg Hx    Stomach cancer Neg Hx    Pancreatic cancer Neg Hx   :  Social History   Socioeconomic History   Marital status: Married    Spouse name: Not on file   Number of children: 1   Years of education: Not on file   Highest education level: Not on file  Occupational History    Occupation: iEngineer, maintenance (IT) Tobacco Use   Smoking status: Never   Smokeless  tobacco: Never  Vaping Use   Vaping Use: Never used  Substance and Sexual Activity   Alcohol use: No   Drug use: No   Sexual activity: Not on file  Other Topics Concern   Not on file  Social History Narrative   Not on file   Social Determinants of Health   Financial Resource Strain: Not on file  Food Insecurity: Not on file  Transportation Needs: Not on file  Physical Activity: Not on file  Stress: Not on file  Social Connections: Not on file  Intimate Partner Violence: Not on file  :  Review of Systems: Unable to obtain.  Exam: Patient Vitals for the past 24 hrs:  BP Temp Temp src Pulse Resp SpO2 Weight  05/30/22 0745 -- -- -- -- -- 97 % --  05/30/22 0730 -- 98.4 F (36.9 C) -- 90 -- 98 % --  05/30/22 0715 -- 98.4 F (36.9 C) -- 90 -- 97 % --  05/30/22 0700 -- 98.4 F (36.9 C) -- 90 -- 97 % --  05/30/22 0645 -- 98.4 F (36.9 C) -- 89 -- 96 % --  05/30/22 0630 -- 98.2 F (36.8 C) -- 89 -- 96 % --  05/30/22 0615 -- 98.1 F (36.7 C) -- 89 -- 96 % --  05/30/22 0600 (!) 111/59 98.1 F (36.7 C) -- 89 -- 95 % --  05/30/22 0545 -- 97.9 F (36.6 C) -- 88 -- 94 % --  05/30/22 0530 -- 97.7 F (36.5 C) -- 88 -- 93 % --  05/30/22 0515 -- 97.7 F (36.5 C) -- 88 -- 92 % --  05/30/22 0500 -- 97.7 F (36.5 C) -- 87 -- 95 % 94.3 kg  05/30/22 0445 -- (!) 97.5 F (36.4 C) -- 86 -- 96 % --  05/30/22 0430 -- (!) 97.5 F (36.4 C) -- 86 -- 97 % --  05/30/22 0415 -- (!) 97.3 F (36.3 C) -- 83 -- 97 % --  05/30/22 0407 (!) 123/49 -- -- 84 (!) 35 97 % --  05/30/22 0400 105/60 (!) 97.2 F (36.2 C) Esophageal 83 -- 97 % --  05/30/22 0345 -- (!) 97 F (36.1 C) -- 83 -- 97 % --  05/30/22 0330 -- (!) 96.8 F (36 C) -- 82 -- 97 % --  05/30/22 0315 -- (!) 96.6 F (35.9 C) -- 82 -- 97 % --  05/30/22 0300 -- (!) 96.4 F (35.8 C) -- 81 -- 97 % --  05/30/22 0245 -- (!) 96.3 F (35.7 C) -- 81 -- 97  % --  05/30/22 0230 -- (!) 96.1 F (35.6 C) -- 80 -- 97 % --  05/30/22 0215 -- (!) 95.9 F (35.5 C) -- 80 -- 97 % --  05/30/22 0200 103/60 (!) 95.7 F (35.4 C) -- 79 -- 97 % --  05/30/22 0145 -- (!) 95.5 F (35.3 C) -- 78 -- 97 % --  05/30/22 0130 -- (!) 95.5 F (35.3 C) -- 78 -- 96 % --  05/30/22 0115 -- (!) 95.4 F (35.2 C) -- 77 -- 97 % --  05/30/22 0100 -- (!) 95.4 F (35.2 C) -- 77 -- 97 % --  05/30/22 0045 -- (!) 95.4 F (35.2 C) -- 77 -- 97 % --  05/30/22 0030 -- (!) 95.2 F (35.1 C) -- 79 -- 97 % --  05/30/22 0017 -- (!) 95.2 F (35.1 C) -- 79 -- 98 % --  05/30/22 0015 -- (!) 95.2  F (35.1 C) -- 78 -- 98 % --  05/30/22 0000 (!) 102/54 (!) 95.2 F (35.1 C) Esophageal 80 -- 98 % --  05/29/22 2345 -- (!) 95 F (35 C) -- 79 -- 94 % --  05/29/22 2339 (!) 110/47 (!) 95 F (35 C) -- 79 (!) 35 94 % --  05/29/22 2330 -- (!) 94.8 F (34.9 C) -- 76 -- 96 % --  05/29/22 2315 -- (!) 94.8 F (34.9 C) -- 75 -- 98 % --  05/29/22 2300 (!) 102/59 (!) 94.8 F (34.9 C) -- 76 -- 98 % --  05/29/22 2245 -- (!) 94.6 F (34.8 C) -- 76 -- 98 % --  05/29/22 2230 98/62 (!) 94.6 F (34.8 C) -- 76 -- 97 % --  05/29/22 2215 -- (!) 94.6 F (34.8 C) -- 75 -- 97 % --  05/29/22 2200 92/60 (!) 94.6 F (34.8 C) -- 75 -- 97 % --  05/29/22 2145 -- (!) 94.6 F (34.8 C) -- 75 -- 97 % --  05/29/22 2130 96/62 (!) 94.8 F (34.9 C) -- 76 -- 97 % --  05/29/22 2115 -- (!) 94.8 F (34.9 C) -- 75 -- 97 % --  05/29/22 2100 99/62 (!) 95 F (35 C) -- 75 -- 97 % --  05/29/22 2045 -- (!) 95 F (35 C) -- 76 -- 97 % --  05/29/22 2040 -- -- -- -- -- 97 % --  05/29/22 2030 102/61 (!) 95.2 F (35.1 C) -- 77 -- 98 % --  05/29/22 2025 128/70 -- -- 77 (!) 35 97 % --  05/29/22 2015 -- (!) 95.2 F (35.1 C) -- 77 -- 98 % --  05/29/22 2000 128/70 (!) 95.4 F (35.2 C) Esophageal 77 -- 99 % --  05/29/22 1945 -- (!) 95.5 F (35.3 C) -- 77 -- 98 % --  05/29/22 1930 105/63 (!) 95.7 F (35.4 C) -- 79 -- 98 % --   05/29/22 1915 -- (!) 95.9 F (35.5 C) -- 80 -- 98 % --  05/29/22 1900 (!) 89/56 (!) 96.3 F (35.7 C) -- 82 (!) 35 98 % --  05/29/22 1845 -- (!) 96.4 F (35.8 C) -- 79 -- 99 % --  05/29/22 1830 115/67 (!) 96.4 F (35.8 C) -- 83 -- 98 % --  05/29/22 1815 -- (!) 96.8 F (36 C) -- 84 -- 97 % --  05/29/22 1800 101/60 (!) 97.3 F (36.3 C) -- 86 (!) 35 97 % --  05/29/22 1748 -- (!) 97.5 F (36.4 C) Esophageal 87 -- 97 % --  05/29/22 1745 -- -- -- 88 -- 97 % --  05/29/22 1740 -- 99.4 F (37.4 C) Oral 86 -- 97 % --  05/29/22 1730 120/79 (!) 96.8 F (36 C) Esophageal 79 -- 96 % --  05/29/22 1715 (!) 147/79 (!) 96.6 F (35.9 C) -- 78 -- 96 % --  05/29/22 1710 -- -- -- 67 -- 97 % --  05/29/22 1705 -- -- -- 88 -- 97 % --  05/29/22 1700 98/60 -- -- 94 (!) 35 96 % --  05/29/22 1645 -- -- -- 92 -- 98 % --  05/29/22 1630 100/69 -- -- 92 -- 97 % --  05/29/22 1615 -- -- -- 92 -- 98 % --  05/29/22 1600 117/64 97.9 F (36.6 C) Esophageal 94 (!) 35 95 % --  05/29/22 1545 -- 97.9 F (36.6 C) -- 94 -- 95 % --  05/29/22 1530  135/75 98.1 F (36.7 C) -- 95 -- 95 % --  05/29/22 1515 -- 97.9 F (36.6 C) -- 94 -- 93 % --  05/29/22 1500 (!) 155/82 98.4 F (36.9 C) -- 97 -- 92 % --  05/29/22 1445 -- 99.3 F (37.4 C) -- 96 -- 93 % --  05/29/22 1435 -- 100 F (37.8 C) -- 95 -- 96 % --  05/29/22 1430 123/69 100.2 F (37.9 C) -- 95 -- 98 % --  05/29/22 1415 -- (!) 100.6 F (38.1 C) -- 97 -- 97 % --  05/29/22 1400 114/67 (!) 100.8 F (38.2 C) -- 97 (!) 35 97 % --  05/29/22 1345 -- (!) 100.8 F (38.2 C) -- 96 -- 97 % --  05/29/22 1330 122/68 (!) 100.8 F (38.2 C) -- 98 -- 97 % --  05/29/22 1315 -- (!) 100.8 F (38.2 C) -- (!) 101 -- 97 % --  05/29/22 1300 127/70 (!) 100.8 F (38.2 C) -- (!) 101 (!) 35 97 % --  05/29/22 1245 -- (!) 100.8 F (38.2 C) -- (!) 102 -- 97 % --  05/29/22 1230 119/74 (!) 100.8 F (38.2 C) -- (!) 103 -- 97 % --  05/29/22 1215 -- (!) 100.8 F (38.2 C) -- (!) 103 -- 96 %  --  05/29/22 1200 124/64 (!) 100.6 F (38.1 C) Esophageal (!) 103 (!) 35 96 % --  05/29/22 1145 -- (!) 100.4 F (38 C) -- (!) 104 -- 96 % --  05/29/22 1130 97/62 100 F (37.8 C) -- (!) 104 -- 95 % --  05/29/22 1115 112/64 (!) 100.8 F (38.2 C) -- 96 -- 94 % --  05/29/22 1100 -- (!) 100.6 F (38.1 C) -- 97 (!) 35 97 % --  05/29/22 1045 -- (!) 100.8 F (38.2 C) -- 96 -- 97 % --  05/29/22 1030 -- (!) 100.8 F (38.2 C) -- 98 -- 96 % --  05/29/22 1021 -- -- -- -- -- 97 % --  05/29/22 1015 -- (!) 100.9 F (38.3 C) -- 100 -- 98 % --  05/29/22 1001 -- (!) 100.9 F (38.3 C) -- (!) 102 -- 97 % --  05/29/22 1000 (!) 87/56 (!) 100.9 F (38.3 C) -- (!) 101 (!) 30 96 % --  05/29/22 0945 -- (!) 100.9 F (38.3 C) -- (!) 101 -- 96 % --  05/29/22 0930 (!) 88/54 (!) 101.1 F (38.4 C) -- (!) 106 -- 95 % --  05/29/22 0915 (!) 105/59 (!) 100.9 F (38.3 C) -- (!) 108 -- 94 % --  05/29/22 0900 -- (!) 100.6 F (38.1 C) -- (!) 112 (!) 30 96 % --  05/29/22 0845 -- (!) 100.8 F (38.2 C) -- (!) 111 -- 94 % --  05/29/22 0830 117/61 (!) 100.8 F (38.2 C) -- (!) 113 -- 95 % --  05/29/22 0815 -- (!) 100.9 F (38.3 C) -- (!) 115 -- 94 % --    General: Chronically ill-appearing female, laying in bed, no distress. Eyes:  no scleral icterus.   ENT: ETT in place. Respiratory: Coarse breath sounds. Cardiovascular:  Regular rate and rhythm, S1/S2, without murmur, rub or gallop.   GI: Soft, nontender nondistended. Skin exam was without echymosis, petichae.   Neuro: Intubated, sedated.   Lab Results  Component Value Date   WBC 9.1 05/30/2022   HGB 8.4 (L) 05/30/2022   HCT 27.2 (L) 05/30/2022   PLT 148 (L) 05/30/2022   GLUCOSE 166 (  H) 05/30/2022   CHOL 144 04/20/2022   TRIG 481 (H) 05/29/2022   HDL 36 (L) 04/20/2022   LDLCALC 72 04/20/2022   ALT 4,999 (H) 05/30/2022   AST 9,211 (H) 05/30/2022   NA 143 05/30/2022   K 4.3 05/30/2022   CL 102 05/30/2022   CREATININE 2.03 (H) 05/30/2022   BUN 48  (H) 05/30/2022   CO2 27 05/30/2022    CT HEAD WO CONTRAST (5MM)  Result Date: 05/29/2022 CLINICAL DATA:  Mental status changes. Persistent or worsening. Suspected viral meningitis. EXAM: CT HEAD WITHOUT CONTRAST TECHNIQUE: Contiguous axial images were obtained from the base of the skull through the vertex without intravenous contrast. RADIATION DOSE REDUCTION: This exam was performed according to the departmental dose-optimization program which includes automated exposure control, adjustment of the mA and/or kV according to patient size and/or use of iterative reconstruction technique. COMPARISON:  CT head without contrast 05/20/2022. MR head without and with contrast 05/25/2022 FINDINGS: Brain: Sulci are poorly seen over the posterior parietal lobes and occipital lobes. Mild atrophy and white matter changes are present. Ventricles are stable in size. No discrete extra-axial fluid collections are present. The brainstem and cerebellum are within normal limits. Vascular: Atherosclerotic calcifications are again seen within the cavernous internal carotid arteries. No hyperdense vessel present. Skull: Calvarium is intact. No focal lytic or blastic lesions are present. No significant extracranial soft tissue lesion is present. Sinuses/Orbits: Right greater than left mastoid effusions are new. No significant middle ear effusions are present. The paranasal sinuses and mastoid air cells are otherwise clear. The globes and orbits are within normal limits. IMPRESSION: 1. Sulci are poorly seen over the posterior parietal lobes and occipital lobes. This is consistent with abnormal CSF signal and suspected meningitis. 2. No discrete extra-axial fluid collections. 3. Stable atrophy and white matter disease. 4. New right greater than left mastoid effusions. Electronically Signed   By: San Morelle M.D.   On: 05/29/2022 17:33   DG Chest Port 1 View  Result Date: 05/29/2022 CLINICAL DATA:  Evaluate line placement.  EXAM: PORTABLE CHEST 1 VIEW COMPARISON:  May 29, 2022 FINDINGS: The ETT is in good position. A right central line terminates in the central SVC. A left PICC line terminates in the central SVC. No pneumothorax. Bilateral patchy pulmonary infiltrates have worsened in the interval, particularly in the right mid lung. The cardiomediastinal silhouette is stable. No other acute abnormalities. IMPRESSION: 1. Support apparatus as above. Specifically, the new right central line is in good position with no pneumothorax. 2. Worsening bilateral pulmonary infiltrates, particularly in the right mid lung. Electronically Signed   By: Dorise Bullion III M.D.   On: 05/29/2022 09:50   VAS [place] LOWER EXTREMITY VENOUS (DVT)  Result Date: 05/29/2022  Lower Venous DVT Study Patient Name:  DARSHAY DEUPREE  Date of Exam:   05/28/2022 Medical Rec #: [id number]       Accession #:    [id number] Date of Birth: 1956-04-22       Patient Gender: F Patient Age:   68 years Exam Location:  [facility] Procedure:      VAS [place] LOWER EXTREMITY VENOUS (DVT) Referring Phys: Ina Homes --------------------------------------------------------------------------------  Indications: Edema.  Risk Factors: Ventilation. Limitations: Ventilation. Enlarged bladder. Positioning. Comparison Study: No prior study Performing Technologist: Sharion Dove RVS  Examination Guidelines: A complete evaluation includes B-mode imaging, spectral Doppler, color Doppler, and power Doppler as needed of all accessible portions of each vessel. Bilateral testing is considered an integral  part of a complete examination. Limited examinations for reoccurring indications may be performed as noted. The reflux portion of the exam is performed with the patient in reverse Trendelenburg.  +---------+---------------+---------+-----------+----------+--------------+ RIGHT    CompressibilityPhasicitySpontaneityPropertiesThrombus Aging  +---------+---------------+---------+-----------+----------+--------------+ CFV      Full           No       No                                  +---------+---------------+---------+-----------+----------+--------------+ SFJ      Partial                                                     +---------+---------------+---------+-----------+----------+--------------+ FV Prox  Full           No       No                                  +---------+---------------+---------+-----------+----------+--------------+ FV Mid   Full           No       No                                  +---------+---------------+---------+-----------+----------+--------------+ FV DistalFull           No       No                                  +---------+---------------+---------+-----------+----------+--------------+ PFV      Full           No       No                                  +---------+---------------+---------+-----------+----------+--------------+ POP      Full           No       No                                  +---------+---------------+---------+-----------+----------+--------------+ PTV      Full           No       No                                  +---------+---------------+---------+-----------+----------+--------------+ PERO     Full           No       No                                  +---------+---------------+---------+-----------+----------+--------------+ Gastroc                 No       No                                  +---------+---------------+---------+-----------+----------+--------------+                                                                      +---------+---------------+---------+-----------+----------+--------------+   +---------+---------------+---------+-----------+----------+--------------+  LEFT     CompressibilityPhasicitySpontaneityPropertiesThrombus Aging  +---------+---------------+---------+-----------+----------+--------------+ CFV      Full           No       No                                  +---------+---------------+---------+-----------+----------+--------------+ SFJ      Partial                                                     +---------+---------------+---------+-----------+----------+--------------+ FV Prox  Full           No       No                                  +---------+---------------+---------+-----------+----------+--------------+ FV Mid   Full           No       No                                  +---------+---------------+---------+-----------+----------+--------------+ FV DistalFull           No       No                                  +---------+---------------+---------+-----------+----------+--------------+ PFV      Full                                                        +---------+---------------+---------+-----------+----------+--------------+ POP      Full           No       No                                  +---------+---------------+---------+-----------+----------+--------------+ PTV      Full                                                        +---------+---------------+---------+-----------+----------+--------------+ PERO     Full                                                        +---------+---------------+---------+-----------+----------+--------------+ EIV mid  Partial                                                     +---------+---------------+---------+-----------+----------+--------------+  Summary: RIGHT: - Vessels compress, however, they are dilated and waveforms are continuous. IVC is patent as is distal right common iliac vein.  LEFT: - Vessels compress, however, they are dilated and waveforms are continuous. Mid external iliac vein is partially compressible with no color flow noted.  *See table(s) above for measurements and  observations. Electronically signed by Jamelle Haring on 05/29/2022 at 9:23:40 AM.    Final    ECHOCARDIOGRAM LIMITED  Result Date: 05/29/2022    ECHOCARDIOGRAM LIMITED REPORT   Patient Name:   JALAYAH GUTRIDGE Date of Exam: 05/29/2022 Medical Rec #:  [id number]      Height:       66.0 in Accession #:    [id number]     Weight:       195.5 lb Date of Birth:  [birth year]      BSA:          1.981 m Patient Age:    68 years       BP:           153/61 mmHg Patient Gender: F              HR:           114 bpm. Exam Location:  Inpatient Procedure: Limited Echo, Color Doppler and Cardiac Doppler STAT ECHO Indications:    Acute Ischemic Heart Disease  History:        Patient has prior history of Echocardiogram examinations, most                 recent 05/28/2022. Risk Factors:Hypertension, Diabetes and                 Dyslipidemia.  Sonographer:    Raquel Sarna Senior RDCS Referring Phys: [id number] [place]  1. Left ventricular ejection fraction, by estimation, is 70 to 75%. The left ventricle has hyperdynamic function. Left ventricular endocardial border not optimally defined to evaluate regional wall motion. Left ventricular diastolic function could not be evaluated. There is the interventricular septum is flattened in systole and diastole, consistent with right ventricular pressure and volume overload.  2. There is a suggestion of McConnell's sign, which is seen with acute cor pulmonale (e.g. pulmonary embolism, acue respiratory failure, etc). Right ventricular systolic function is moderately reduced. The right ventricular size is mildly enlarged. There is moderately elevated pulmonary artery systolic pressure. The estimated right ventricular systolic pressure is 97.9 mmHg.  3. Right atrial size was moderately dilated.  4. No evidence of mitral valve regurgitation. Moderate mitral annular calcification.  5. Tricuspid valve regurgitation is moderate.  6. The aortic valve was not well visualized. Aortic valve  regurgitation is not visualized. No aortic stenosis is present.  7. The inferior vena cava is dilated in size with <50% respiratory variability, suggesting right atrial pressure of 15 mmHg. Comparison(s): Prior images reviewed side by side. The left ventricular function is unchanged. There is marked deterioration in right ventricular function. The inferior vena cava is now plethoric. Findings are consistent with acute right heart failure, possibly due to acute pulmonary embolism or other cause of abrupt and severe increase in pulmonary vascular resistance. FINDINGS  Left Ventricle: Left ventricular ejection fraction, by estimation, is 70 to 75%. The left ventricle has hyperdynamic function. Left ventricular endocardial border not optimally defined to evaluate regional wall motion. The left ventricular internal cavity size was small. There is no left ventricular hypertrophy. The interventricular septum is flattened in systole and diastole, consistent with right ventricular  pressure and volume overload. Left ventricular diastolic function could not be evaluated. Right Ventricle: There is a suggestion of McConnell's sign, which is seen with acute cor pulmonale (e.g. pulmonary embolism, acue respiratory failure, etc). The right ventricular size is mildly enlarged. Right ventricular systolic function is moderately reduced. There is moderately elevated pulmonary artery systolic pressure. The tricuspid regurgitant velocity is 2.99 m/s, and with an assumed right atrial pressure of 15 mmHg, the estimated right ventricular systolic pressure is 16.1 mmHg. Left Atrium: Left atrial size was normal in size. Right Atrium: Right atrial size was moderately dilated. Pericardium: There is no evidence of pericardial effusion. Mitral Valve: Moderate mitral annular calcification. Tricuspid Valve: Tricuspid valve regurgitation is moderate. Aortic Valve: The aortic valve was not well visualized. Aortic valve regurgitation is not  visualized. No aortic stenosis is present. Pulmonic Valve: The pulmonic valve was not well visualized. Venous: The inferior vena cava is dilated in size with less than 50% respiratory variability, suggesting right atrial pressure of 15 mmHg. IAS/Shunts: The atrial septum is grossly normal. RIGHT VENTRICLE RV S prime:     8.81 cm/s TAPSE (M-mode): 1.4 cm AORTIC VALVE LVOT Vmax:   94.80 cm/s LVOT Vmean:  69.700 cm/s LVOT VTI:    0.105 m TRICUSPID VALVE TR Peak grad:   35.8 mmHg TR Vmax:        299.00 cm/s  SHUNTS Systemic VTI: 0.10 m Dani Gobble Croitoru MD Electronically signed by Sanda Klein MD Signature Date/Time: 05/29/2022/8:41:23 AM    Final    US Abdomen Limited RUQ (LIVER/GB)  Result Date: 05/29/2022 CLINICAL DATA:  66 year old female with abnormal LFTs. EXAM: ULTRASOUND ABDOMEN LIMITED RIGHT UPPER QUADRANT COMPARISON:  CT Abdomen and Pelvis 09/03/2020. FINDINGS: Gallbladder: Sludge and shadowing echogenic gallstones, individually up to 10 mm (image 12). Mild gallbladder wall thickening of up to 4 mm with trace pericholecystic fluid. Gallbladder appears partially contracted. No sonographic Murphy sign elicited. Common bile duct: Diameter: 2-3 mm, normal. Liver: Small volume perihepatic ascites. No discrete liver lesion, but nodular liver contour such as on image 29. Portal vein is patent on color Doppler imaging with normal direction of blood flow towards the liver. Other: Right pleural effusion is visible (image 35). Negative visible right kidney. IMPRESSION: 1. Nodular liver contour and small volume ascites suspicious for Cirrhosis. No discrete liver lesion by ultrasound. 2. Gallstones with sludge and mild gallbladder wall thickening. However, no sonographic Murphy sign elicited. Favor these changes are reactive secondary to #1 rather than due to acute cholecystitis. 3. No evidence of bile duct obstruction. 4. Right pleural effusion. Electronically Signed   By: Genevie Ann M.D.   On: 05/29/2022 08:00   DG  CHEST PORT 1 VIEW  Result Date: 05/29/2022 CLINICAL DATA:  66 year old female respiratory distress, intubated. EXAM: PORTABLE CHEST 1 VIEW COMPARISON:  Portable chest 05/28/2022 and earlier. FINDINGS: Portable AP semi upright view at 0412 hours. Stable lines and tubes. Less rotated today. Confluent and rounded 4.5 cm right perihilar opacity, although right lung ventilation appears improved since 05/27/2022. Patchy and streaky multifocal left perihilar opacity is stable. No pneumothorax or pleural effusion. No areas of worsening ventilation. Stable cardiac size and mediastinal contours. Paucity of bowel gas in the upper abdomen. Prior cervical ACDF. No acute osseous abnormality identified. IMPRESSION: 1.  Stable lines and tubes. 2. Bilateral pneumonia suspected, with right lung ventilation mildly improved since 05/27/2022. 3. No new cardiopulmonary abnormality. Electronically Signed   By: Genevie Ann M.D.   On: 05/29/2022 05:07   ECHOCARDIOGRAM  LIMITED BUBBLE STUDY  Result Date: 05/28/2022    ECHOCARDIOGRAM LIMITED REPORT   Patient Name:   KASHIA BROSSARD Date of Exam: 05/28/2022 Medical Rec #:  [id number]      Height:       66.0 in Accession #:    [id number]     Weight:       195.5 lb Date of Birth:  November 10, 1955      BSA:          1.981 m Patient Age:    45 years       BP:           98/55 mmHg Patient Gender: F              HR:           101 bpm. Exam Location:  Inpatient Procedure: Limited Echo, Color Doppler, Cardiac Doppler and Saline Contrast            Bubble Study Indications:    patent foramen ovale  History:        Patient has prior history of Echocardiogram examinations, most                 recent 05/24/2022. Sepsis. Meningitis. Pneumonia.; Risk                 Factors:Diabetes, Hypertension and Dyslipidemia.  Sonographer:    Johny Chess RDCS Referring Phys: [id number] [place]urbish  Sonographer Comments: Image acquisition challenging due to respiratory motion. IMPRESSIONS  1. Left ventricular ejection  fraction, by estimation, is 70 to 75%. The left ventricle has hyperdynamic function. The left ventricle has no regional wall motion abnormalities. Left ventricular diastolic function could not be evaluated.  2. Right ventricular systolic function is normal. The right ventricular size is normal.  3. The mitral valve is normal in structure. No evidence of mitral valve regurgitation.  4. Aortic valve regurgitation is not visualized. No aortic stenosis is present.  5. The inferior vena cava is normal in size with greater than 50% respiratory variability, suggesting right atrial pressure of 3 mmHg.  6. Agitated saline contrast bubble study was negative, with no evidence of any interatrial shunt. Comparison(s): No significant change from prior study. Prior images reviewed side by side. FINDINGS  Left Ventricle: Left ventricular ejection fraction, by estimation, is 70 to 75%. The left ventricle has hyperdynamic function. The left ventricle has no regional wall motion abnormalities. The left ventricular internal cavity size was normal in size. There is no left ventricular hypertrophy. Left ventricular diastolic function could not be evaluated. Right Ventricle: The right ventricular size is normal. No increase in right ventricular wall thickness. Right ventricular systolic function is normal. Left Atrium: Left atrial size was normal in size. Right Atrium: Right atrial size was normal in size. Mitral Valve: The mitral valve is normal in structure. Mild mitral annular calcification. Tricuspid Valve: The tricuspid valve is grossly normal. Aortic Valve: Aortic valve regurgitation is not visualized. No aortic stenosis is present. Pulmonic Valve: The pulmonic valve was not well visualized. Venous: The inferior vena cava is normal in size with greater than 50% respiratory variability, suggesting right atrial pressure of 3 mmHg. IAS/Shunts: Agitated saline contrast was given intravenously to evaluate for intracardiac shunting.  Agitated saline contrast bubble study was negative, with no evidence of any interatrial shunt. IVC IVC diam: 1.80 cm Sanda Klein MD Electronically signed by Sanda Klein MD Signature Date/Time: 05/28/2022/1:02:45 PM    Final    DG CHEST  PORT 1 VIEW  Result Date: 05/28/2022 CLINICAL DATA:  66 year old female with a history of respiratory failure EXAM: PORTABLE CHEST 1 VIEW COMPARISON:  05/27/2022 FINDINGS: Right rotation limits evaluation. Unchanged cardiomediastinal silhouette. Mixed interstitial and airspace disease again demonstrated bilateral lungs, with improved airspace opacity at the right lung base. Endotracheal tube remains, terminating approximately 5.2 cm above the carina. Unchanged left upper extremity PICC. Surgical changes of the cervical region. No pneumothorax or large pleural effusion. IMPRESSION: Redemonstration of bilateral mixed interstitial and airspace disease, somewhat improved at the right lung base Unchanged endotracheal tube terminating suitably above the carina. Unchanged left upper extremity PICC Electronically Signed   By: Corrie Mckusick D.O.   On: 05/28/2022 10:17   DG Chest Port 1 View  Result Date: 05/27/2022 CLINICAL DATA:  Ventilator dependent respiratory failure. EXAM: PORTABLE CHEST 1 VIEW COMPARISON:  05/25/2022. FINDINGS: 5:19 a.m. ETT tip is 3.1 cm from the carina. NGT enters the stomach with the intragastric course is not filmed. Left PICC terminates at the superior cavoatrial junction. The cardiac size is borderline. The central vessels are normal caliber. There is worsening patchy consolidation in the left upper and right lower lung fields most likely due to multilobar pneumonia. Aspiration component possible. Scattered opacities in the left lower lung field are unaltered. Right upper lung field is essentially clear.  Thoracic spondylosis. IMPRESSION: Worsening bilateral opacities consistent with worsening bilateral pneumonia. Electronically Signed   By: Telford Nab M.D.   On: 05/27/2022 07:24   MR CERVICAL SPINE W WO CONTRAST  Result Date: 05/25/2022 CLINICAL DATA:  Myelopathy, acute, cervical spine EXAM: MRI CERVICAL SPINE WITHOUT AND WITH CONTRAST TECHNIQUE: Multiplanar and multiecho pulse sequences of the cervical spine, to include the craniocervical junction and cervicothoracic junction, were obtained without and with intravenous contrast. CONTRAST:  80m GADAVIST GADOBUTROL 1 MMOL/ML IV SOLN COMPARISON:  CT neck 09/03/2020. FINDINGS: Alignment: Straightening of the normal cervical lordosis. Vertebrae: Prior ACDF from C5-C7. No evidence of acute fracture no evidence of discitis. Cord: There is abnormal cord signal at C4-C5 which does not enhance. No enhancing spinal canal collection. Posterior Fossa, vertebral arteries, paraspinal tissues: Atherosclerosis of the right vertebral artery. Preserved flow voids. Enlarged multinodular goiter, similar to prior CT in November 2021. Disc levels: The craniocervical junction is unremarkable. C2-C3: No significant stenosis.  Mild left facet effusion. C3-C4: Mild uncovertebral joint hypertrophy. Mild right neural foraminal narrowing. No spinal canal stenosis. Bilateral facet effusions, right greater than left. Bilateral facet effusions right greater than left. C4-C5: Severe disc height loss with disc bulging, uncovertebral joint hypertrophy and bilateral facet arthropathy. There is moderate spinal canal stenosis with and indentation of the cord and mild associated abnormal cord signal. There is severe left and moderate right neural foraminal stenosis. Right facet effusion. C5-C6: Prior ACDF. No significant spinal stenosis. Mild uncovertebral joint hypertrophy. Moderate right neural foraminal stenosis. Mild left neural foraminal stenosis C6-C7: Prior ACDF. No significant spinal canal stenosis. Mild uncovertebral joint hypertrophy. Mild to moderate right neural foraminal stenosis. No left neural foraminal stenosis. C7-T1:  Right paracentral/foraminal disc extrusion with endplate spurring results in moderate right lateral recess stenosis and mild neural foraminal stenosis. Upper chest: There is multifocal airspace disease in the lung apices, as seen on recent chest radiograph. IMPRESSION: Prior ACDF from C5-C7. Residual moderate right neural foraminal stenosis at C5-6 and mild-to-moderate neural foraminal stenosis on the right at C6-C7. Patent spinal canal at these levels. Severe degenerative disc disease at C4-C5 with endplate spurring and facet arthropathy  results in moderate spinal canal stenosis and mild abnormal cord signal suggesting congestive myelopathy. Severe left and moderate right neural foraminal stenosis at this level. Right paracentral/foraminal disc extrusion at C7-T1 with endplate spurring results in moderate right lateral recess and mild neural foraminal stenosis. Multifocal airspace disease in the lung apices concerning for pneumonia. Electronically Signed   By: Maurine Simmering M.D.   On: 05/25/2022 17:42   MR BRAIN W WO CONTRAST  Result Date: 05/25/2022 CLINICAL DATA:  Altered mental status EXAM: MRI HEAD WITHOUT AND WITH CONTRAST TECHNIQUE: Multiplanar, multiecho pulse sequences of the brain and surrounding structures were obtained without and with intravenous contrast. CONTRAST:  61m GADAVIST GADOBUTROL 1 MMOL/ML IV SOLN COMPARISON:  05/20/2022 CT head, no prior MRI head FINDINGS: Brain: No restricted diffusion to suggest acute or subacute infarct. No acute hemorrhage, mass, mass effect, or midline shift. FLAIR hyperintense signal along the cortex, primarily posteriorly (series 6, image 26 for example) suggestive of extra-axial material. No abnormal parenchymal or leptomeningeal enhancement. Along the left posterior frontal/anterior parietal convexity, there is a dural-based partially calcified enhancing mass which measures up to 9 x 19 x 6 mm (AP x TR x CC) (series 20, image 46 and series 21, image 14), most  likely a meningioma. No definite underlying T2 hyperintense signal. Advanced cerebral volume loss for age. T2 hyperintense signal in the periventricular white matter, likely the sequela of chronic small vessel ischemic disease. Vascular: Normal arterial flow voids. Normal arterial and venous enhancement. Skull and upper cervical spine: Normal marrow signal. Sinuses/Orbits: Mucosal thickening in the ethmoid air cells and maxillary sinuses. The orbits are unremarkable. Other: Fluid in the mastoid air cells. IMPRESSION: 1. Abnormal CSF signal, primarily posteriorly, which is nonspecific and may represent infection/meningitis and/or hemorrhage. 2. Enhancing extra-axial lesion along the left frontoparietal convexity, most likely a meningioma, without evidence of underlying mass effect. 3. No acute or subacute infarct. These results will be called to the ordering clinician or representative by the Radiologist Assistant, and communication documented in the PACS or [employer] Electronically Signed   By: AMerilyn BabaM.D.   On: 05/25/2022 17:28   DG Chest Port 1 View  Addendum Date: 05/25/2022   ADDENDUM REPORT: 05/25/2022 08:31 ADDENDUM: Left upper extremity PICC with tip projecting in the region of the lower SVC. The PICC no longer curls medially, suggesting it may no longer be within the azygous. Recommend continued imaging follow-up. Electronically Signed   By: FMargaretha SheffieldM.D.   On: 05/25/2022 08:31   Result Date: 05/25/2022 CLINICAL DATA:  ET tube present. EXAM: PORTABLE CHEST 1 VIEW COMPARISON:  None Available. FINDINGS: ET tube approximately 5 cm above the carina. Improved aeration of the lungs with improved but persistent left greater than right perihilar opacities. No visible pleural effusions or pneumothorax. Cardiomediastinal silhouette is unchanged. Gastric tube courses below the diaphragm with the tip outside the field of view. Partially imaged lumbar spinal fusion. IMPRESSION: 1. ET tube  approximately 5 cm above the carina. 2. Improved aeration of the lungs with improved but persistent left greater than right perihilar opacities Electronically Signed: By: FMargaretha SheffieldM.D. On: 05/25/2022 08:19   Overnight EEG with video  Result Date: 05/25/2022 YLora Havens MD     05/25/2022  9:50 AM Patient Name: CLEXXUS UNDERHILLMRN: [id number] Attending: PLora HavensReferring Physician/Provider: KGreta Doom MD  Duration: 05/24/2022 2041 to 05/25/2022 04166Patient history: 89yoF with ams. EEG to evaluate for seizure Level of  alertness: lethargic, asleep AEDs during EEG study: propofol Technical aspects: This EEG study was done with scalp electrodes positioned according to the 10-20 International system of electrode placement. Electrical activity was acquired at a sampling rate of '500Hz'$  and reviewed with a high frequency filter of '70Hz'$  and a low frequency filter of '1Hz'$ . EEG data were recorded continuously and digitally stored. Description: During awake state, no clear posterior dominant rhythm was seen. Sleep was characterized by sleep spindles (12 to 14 Hz), maximal frontocentral region. EEG showed near continuous generalized polymorphic 3 to 6 Hz theta-delta slowing admixed with 1 to 2 seconds of generalized EEG attenuation. Event button was pressed on 05/24/2022 at 2147 and on 05/26/2022 at 0601 for unclear reasons. Concomitant EEG before, during and after the event did not show any EEG changes suggest seizure. Hyperventilation and photic stimulation were not performed.   ABNORMALITY - Continuous slow, generalized IMPRESSION: This study is suggestive of severe diffuse encephalopathy, nonspecific etiology but could be due to sedation.  No seizures or epileptiform discharges were seen throughout the recording. Two events were recorded as described above without concomitant EEG change. These episodes are most likely not epileptic. Priyanka [name]   DG CHEST PORT 1 VIEW  Result  Date: 05/24/2022 CLINICAL DATA:  Status post PICC placement EXAM: PORTABLE CHEST 1 VIEW COMPARISON:  05/24/2022 at 0049 hours FINDINGS: Left arm PICC terminates in the mid SVC with this tip pointed towards the azygous vein. Endotracheal tube terminates 5 cm above the carina. Enteric tube courses into the stomach. Multifocal pneumonia. Masslike opacity in the left upper lobe. No pleural effusion or pneumothorax. The heart is normal in size. Cervical spine fixation hardware, incompletely visualized. IMPRESSION: Left arm PICC terminates in the mid SVC with this tip pointed towards the azygous vein. Otherwise unchanged. Electronically Signed   By: Julian Hy M.D.   On: 05/24/2022 23:50   ECHOCARDIOGRAM COMPLETE  Result Date: 05/24/2022    ECHOCARDIOGRAM REPORT   Patient Name:   ASHELYN MCCRAVY Date of Exam: 05/24/2022 Medical Rec #:  [id number]      Height:       66.0 in Accession #:    [id number]     Weight:       177.2 lb Date of Birth:  1956/04/06      BSA:          1.900 m Patient Age:    26 years       BP:           106/56 mmHg Patient Gender: F              HR:           79 bpm. Exam Location:  Inpatient Procedure: 2D Echo, Cardiac Doppler and Color Doppler Indications:    Abnormal EKG  History:        Patient has prior history of Echocardiogram examinations. Risk                 Factors:Hypertension and Diabetes.  Sonographer:    Jyl Heinz Referring Phys: [id number] STEPHANIE M REESE  Sonographer Comments: Echo performed with patient supine and on artificial respirator and suboptimal parasternal window. IMPRESSIONS  1. Left ventricular ejection fraction, by estimation, is 60 to 65%. The left ventricle has normal function. The left ventricle has no regional wall motion abnormalities. Left ventricular diastolic parameters are consistent with Grade I diastolic dysfunction (impaired relaxation).  2. Right ventricular systolic function is normal. The right ventricular size  is normal. Tricuspid regurgitation  signal is inadequate for assessing PA pressure.  3. The mitral valve is grossly normal. No evidence of mitral valve regurgitation.  4. No significant AI. The aortic valve was not well visualized. There is mild calcification of the aortic valve.  5. The inferior vena cava is dilated in size with >50% respiratory variability, suggesting right atrial pressure of 8 mmHg. Comparison(s): No significant change from prior study. FINDINGS  Left Ventricle: Left ventricular ejection fraction, by estimation, is 60 to 65%. The left ventricle has normal function. The left ventricle has no regional wall motion abnormalities. The left ventricular internal cavity size was normal in size. There is  no left ventricular hypertrophy. Left ventricular diastolic parameters are consistent with Grade I diastolic dysfunction (impaired relaxation). Right Ventricle: The right ventricular size is normal. No increase in right ventricular wall thickness. Right ventricular systolic function is normal. Tricuspid regurgitation signal is inadequate for assessing PA pressure. Left Atrium: Left atrial size was normal in size. Right Atrium: Right atrial size was normal in size. Pericardium: There is no evidence of pericardial effusion. Mitral Valve: The mitral valve is grossly normal. No evidence of mitral valve regurgitation. Tricuspid Valve: The tricuspid valve is normal in structure. Tricuspid valve regurgitation is not demonstrated. Aortic Valve: No significant AI. The aortic valve was not well visualized. There is mild calcification of the aortic valve. Aortic valve peak gradient measures 14.3 mmHg. Pulmonic Valve: The pulmonic valve was not well visualized. Pulmonic valve regurgitation is not visualized. Aorta: The aortic root and ascending aorta are structurally normal, with no evidence of dilitation. Venous: The inferior vena cava is dilated in size with greater than 50% respiratory variability, suggesting right atrial pressure of 8 mmHg.  IAS/Shunts: No atrial level shunt detected by color flow Doppler.  LEFT VENTRICLE PLAX 2D LVIDd:         5.20 cm     Diastology LVIDs:         4.20 cm     LV e' medial:    8.16 cm/s LV PW:         0.90 cm     LV E/e' medial:  14.0 LV IVS:        0.80 cm     LV e' lateral:   7.40 cm/s LVOT diam:     1.80 cm     LV E/e' lateral: 15.4 LV SV:         68 LV SV Index:   36 LVOT Area:     2.54 cm  LV Volumes (MOD) LV vol d, MOD A2C: 77.6 ml LV vol d, MOD A4C: 84.3 ml LV vol s, MOD A2C: 30.5 ml LV vol s, MOD A4C: 34.7 ml LV SV MOD A2C:     47.1 ml LV SV MOD A4C:     84.3 ml LV SV MOD BP:      47.7 ml RIGHT VENTRICLE             IVC RV Basal diam:  3.10 cm     IVC diam: 2.40 cm RV Mid diam:    2.20 cm RV S prime:     12.70 cm/s TAPSE (M-mode): 2.6 cm LEFT ATRIUM             Index        RIGHT ATRIUM           Index LA diam:        3.70 cm 1.95 cm/m   RA  Area:     12.80 cm LA Vol (A2C):   40.1 ml 21.11 ml/m  RA Volume:   29.20 ml  15.37 ml/m LA Vol (A4C):   54.0 ml 28.42 ml/m LA Biplane Vol: 51.4 ml 27.06 ml/m  AORTIC VALVE AV Area (Vmax): 1.79 cm AV Vmax:        189.00 cm/s AV Peak Grad:   14.3 mmHg LVOT Vmax:      133.00 cm/s LVOT Vmean:     104.000 cm/s LVOT VTI:       0.266 m  AORTA Ao Root diam: 2.60 cm Ao Asc diam:  3.00 cm MITRAL VALVE MV Area (PHT): 4.17 cm     SHUNTS MV Decel Time: 182 msec     Systemic VTI:  0.27 m MV E velocity: 114.00 cm/s  Systemic Diam: 1.80 cm MV A velocity: 132.00 cm/s MV E/A ratio:  0.86 Phineas Inches Electronically signed by Phineas Inches Signature Date/Time: 05/24/2022/12:37:03 PM    Final    EEG adult  Result Date: 05/24/2022 Lora Havens, MD     05/24/2022 12:32 PM Patient Name: JALEY YAN MRN: [id number] Epilepsy Attending: Lora Havens Referring Physician/Provider: Candee Furbish, MD Date: 05/24/2022 Duration: 21.25 mins Patient history: 66yo F with ams. EEG to evaluate for seizure Level of alertness: Awake AEDs during EEG study: Propofol Technical aspects: This EEG  study was done with scalp electrodes positioned according to the 10-20 International system of electrode placement. Electrical activity was acquired at a sampling rate of '500Hz'$  and reviewed with a high frequency filter of '70Hz'$  and a low frequency filter of '1Hz'$ . EEG data were recorded continuously and digitally stored. Description: EEG showed continuous generalized 3 to 6 Hz theta-delta slowing. Hyperventilation and photic stimulation were not performed.   Of note, eeg was technically difficult due to significant myogenic artifact. ABNORMALITY - Continuous slow, generalized IMPRESSION: This technically difficult study is suggestive of severe diffuse encephalopathy, nonspecific etiology. No seizures or epileptiform discharges were seen throughout the recording. Priyanka O Yadav   [place] EKG SITE RITE  Result Date: 05/24/2022 If [facility] image not attached, placement could not be confirmed due to current cardiac rhythm.  US THYROID  Result Date: 05/24/2022 CLINICAL DATA:  Thyroid nodule, previous left thyroid biopsy EXAM: THYROID ULTRASOUND TECHNIQUE: Ultrasound examination of the thyroid gland and adjacent soft tissues was performed. COMPARISON:  09/29/2021 FINDINGS: Parenchymal Echotexture: Mildly heterogenous Isthmus: 4 mm Right lobe: 2.8 x 0.9 x 1.6 cm Left lobe: 5.4 x 2.7 x 3.1 cm _________________________________________________________ Estimated total number of nodules >/= 1 cm: 1 Number of spongiform nodules >/=  2 cm not described below (TR1): 0 Number of mixed cystic and solid nodules >/= 1.5 cm not described below (TR2): 0 _________________________________________________________ The previously biopsied left mid thyroid TR 3 nodule is difficult to image today because of suboptimal positioning due to the patient's size and being intubated. No right thyroid abnormality. No regional adenopathy. No hypervascularity. IMPRESSION: Limited thyroid ultrasound. Previously biopsied left mid thyroid nodule is not  as well demonstrated today as detailed above. No gross interval change. No definite new thyroid abnormality. The above is in keeping with the ACR TI-RADS recommendations - J Am Coll Radiol 2017;14:587-595. Electronically Signed   By: Jerilynn Mages.  Shick M.D.   On: 05/24/2022 09:02   DG CHEST PORT 1 VIEW  Result Date: 05/24/2022 CLINICAL DATA:  OG tube placement EXAM: PORTABLE CHEST 1 VIEW COMPARISON:  05/24/2022 at 0020 hours FINDINGS: Endotracheal tumor terminates 3  cm above the carina. Enteric tube courses into the stomach. Otherwise unchanged. Multifocal patchy opacities, suggesting pneumonia, although masslike central left upper lobe opacity warrants attention on follow-up. No pleural effusion or pneumothorax. The heart is normal in size. IMPRESSION: Endotracheal tumor terminates 3 cm above the carina. Enteric tube courses into the stomach. Otherwise unchanged. Electronically Signed   By: Julian Hy M.D.   On: 05/24/2022 01:59   DG CHEST PORT 1 VIEW  Result Date: 05/24/2022 CLINICAL DATA:  Pneumonia EXAM: PORTABLE CHEST 1 VIEW COMPARISON:  CT chest dated 09/03/2020 FINDINGS: Patchy/masslike opacity in the central left upper lobe. Additional mild patchy opacities in the right upper lobe and bilateral lower lobes, suggesting multifocal pneumonia. No pleural effusion or pneumothorax. The heart is normal in size. Cervical spine fixation hardware. IMPRESSION: Suspected multifocal pneumonia. Masslike opacity in the central left upper lobe, presumably related to the suspected infectious process, but warranting attention on follow-up. Follow-up CT chest is suggested in 4-6 weeks to document clearance. If the patient does not have infectious symptoms, CT chest with contrast suggested at this time. Electronically Signed   By: Julian Hy M.D.   On: 05/24/2022 00:45   CT Head Wo Contrast  Result Date: 05/20/2022 CLINICAL DATA:  Dizziness. EXAM: CT HEAD WITHOUT CONTRAST TECHNIQUE: Contiguous axial images were  obtained from the base of the skull through the vertex without intravenous contrast. RADIATION DOSE REDUCTION: This exam was performed according to the departmental dose-optimization program which includes automated exposure control, adjustment of the mA and/or kV according to patient size and/or use of iterative reconstruction technique. COMPARISON:  None Available. FINDINGS: Brain: Mild chronic ischemic white matter disease is noted. No mass effect or midline shift is noted. Ventricular size is within normal limits. There is no evidence of mass lesion, hemorrhage or acute infarction. Vascular: No hyperdense vessel or unexpected calcification. Skull: Normal. Negative for fracture or focal lesion. Sinuses/Orbits: No acute finding. Other: None. IMPRESSION: No acute intracranial abnormality seen. Electronically Signed   By: Marijo Conception M.D.   On: 05/20/2022 09:40     CT HEAD WO CONTRAST (5MM)  Result Date: 05/29/2022 CLINICAL DATA:  Mental status changes. Persistent or worsening. Suspected viral meningitis. EXAM: CT HEAD WITHOUT CONTRAST TECHNIQUE: Contiguous axial images were obtained from the base of the skull through the vertex without intravenous contrast. RADIATION DOSE REDUCTION: This exam was performed according to the departmental dose-optimization program which includes automated exposure control, adjustment of the mA and/or kV according to patient size and/or use of iterative reconstruction technique. COMPARISON:  CT head without contrast 05/20/2022. MR head without and with contrast 05/25/2022 FINDINGS: Brain: Sulci are poorly seen over the posterior parietal lobes and occipital lobes. Mild atrophy and white matter changes are present. Ventricles are stable in size. No discrete extra-axial fluid collections are present. The brainstem and cerebellum are within normal limits. Vascular: Atherosclerotic calcifications are again seen within the cavernous internal carotid arteries. No hyperdense vessel  present. Skull: Calvarium is intact. No focal lytic or blastic lesions are present. No significant extracranial soft tissue lesion is present. Sinuses/Orbits: Right greater than left mastoid effusions are new. No significant middle ear effusions are present. The paranasal sinuses and mastoid air cells are otherwise clear. The globes and orbits are within normal limits. IMPRESSION: 1. Sulci are poorly seen over the posterior parietal lobes and occipital lobes. This is consistent with abnormal CSF signal and suspected meningitis. 2. No discrete extra-axial fluid collections. 3. Stable atrophy and white matter disease.  4. New right greater than left mastoid effusions. Electronically Signed   By: San Morelle M.D.   On: 05/29/2022 17:33   DG Chest Port 1 View  Result Date: 05/29/2022 CLINICAL DATA:  Evaluate line placement. EXAM: PORTABLE CHEST 1 VIEW COMPARISON:  May 29, 2022 FINDINGS: The ETT is in good position. A right central line terminates in the central SVC. A left PICC line terminates in the central SVC. No pneumothorax. Bilateral patchy pulmonary infiltrates have worsened in the interval, particularly in the right mid lung. The cardiomediastinal silhouette is stable. No other acute abnormalities. IMPRESSION: 1. Support apparatus as above. Specifically, the new right central line is in good position with no pneumothorax. 2. Worsening bilateral pulmonary infiltrates, particularly in the right mid lung. Electronically Signed   By: Dorise Bullion III M.D.   On: 05/29/2022 09:50   VAS [place] LOWER EXTREMITY VENOUS (DVT)  Result Date: 05/29/2022  Lower Venous DVT Study Patient Name:  TRULEE HAMSTRA  Date of Exam:   05/28/2022 Medical Rec #: [id number]       Accession #:    [id number] Date of Birth: 18-Jun-1956       Patient Gender: F Patient Age:   20 years Exam Location:  [facility] Procedure:      VAS [place] LOWER EXTREMITY VENOUS (DVT) Referring Phys: Ina Homes  --------------------------------------------------------------------------------  Indications: Edema.  Risk Factors: Ventilation. Limitations: Ventilation. Enlarged bladder. Positioning. Comparison Study: No prior study Performing Technologist: Sharion Dove RVS  Examination Guidelines: A complete evaluation includes B-mode imaging, spectral Doppler, color Doppler, and power Doppler as needed of all accessible portions of each vessel. Bilateral testing is considered an integral part of a complete examination. Limited examinations for reoccurring indications may be performed as noted. The reflux portion of the exam is performed with the patient in reverse Trendelenburg.  +---------+---------------+---------+-----------+----------+--------------+ RIGHT    CompressibilityPhasicitySpontaneityPropertiesThrombus Aging +---------+---------------+---------+-----------+----------+--------------+ CFV      Full           No       No                                  +---------+---------------+---------+-----------+----------+--------------+ SFJ      Partial                                                     +---------+---------------+---------+-----------+----------+--------------+ FV Prox  Full           No       No                                  +---------+---------------+---------+-----------+----------+--------------+ FV Mid   Full           No       No                                  +---------+---------------+---------+-----------+----------+--------------+ FV DistalFull           No       No                                  +---------+---------------+---------+-----------+----------+--------------+  PFV      Full           No       No                                  +---------+---------------+---------+-----------+----------+--------------+ POP      Full           No       No                                   +---------+---------------+---------+-----------+----------+--------------+ PTV      Full           No       No                                  +---------+---------------+---------+-----------+----------+--------------+ PERO     Full           No       No                                  +---------+---------------+---------+-----------+----------+--------------+ Gastroc                 No       No                                  +---------+---------------+---------+-----------+----------+--------------+                                                                      +---------+---------------+---------+-----------+----------+--------------+   +---------+---------------+---------+-----------+----------+--------------+ LEFT     CompressibilityPhasicitySpontaneityPropertiesThrombus Aging +---------+---------------+---------+-----------+----------+--------------+ CFV      Full           No       No                                  +---------+---------------+---------+-----------+----------+--------------+ SFJ      Partial                                                     +---------+---------------+---------+-----------+----------+--------------+ FV Prox  Full           No       No                                  +---------+---------------+---------+-----------+----------+--------------+ FV Mid   Full           No       No                                  +---------+---------------+---------+-----------+----------+--------------+  FV DistalFull           No       No                                  +---------+---------------+---------+-----------+----------+--------------+ PFV      Full                                                        +---------+---------------+---------+-----------+----------+--------------+ POP      Full           No       No                                   +---------+---------------+---------+-----------+----------+--------------+ PTV      Full                                                        +---------+---------------+---------+-----------+----------+--------------+ PERO     Full                                                        +---------+---------------+---------+-----------+----------+--------------+ EIV mid  Partial                                                     +---------+---------------+---------+-----------+----------+--------------+     Summary: RIGHT: - Vessels compress, however, they are dilated and waveforms are continuous. IVC is patent as is distal right common iliac vein.  LEFT: - Vessels compress, however, they are dilated and waveforms are continuous. Mid external iliac vein is partially compressible with no color flow noted.  *See table(s) above for measurements and observations. Electronically signed by Jamelle Haring on 05/29/2022 at 9:23:40 AM.    Final    ECHOCARDIOGRAM LIMITED  Result Date: 05/29/2022    ECHOCARDIOGRAM LIMITED REPORT   Patient Name:   BARBARAJEAN KINZLER Date of Exam: 05/29/2022 Medical Rec #:  [id number]      Height:       66.0 in Accession #:    [id number]     Weight:       195.5 lb Date of Birth:  11/20/1955      BSA:          1.981 m Patient Age:    20 years       BP:           153/61 mmHg Patient Gender: F              HR:           114 bpm. Exam Location:  Inpatient Procedure: Limited Echo, Color Doppler and Cardiac Doppler STAT ECHO Indications:    Acute Ischemic Heart Disease  History:  Patient has prior history of Echocardiogram examinations, most                 recent 05/28/2022. Risk Factors:Hypertension, Diabetes and                 Dyslipidemia.  Sonographer:    Raquel Sarna Senior RDCS Referring Phys: [id number] [place]  1. Left ventricular ejection fraction, by estimation, is 70 to 75%. The left ventricle has hyperdynamic function. Left ventricular endocardial border  not optimally defined to evaluate regional wall motion. Left ventricular diastolic function could not be evaluated. There is the interventricular septum is flattened in systole and diastole, consistent with right ventricular pressure and volume overload.  2. There is a suggestion of McConnell's sign, which is seen with acute cor pulmonale (e.g. pulmonary embolism, acue respiratory failure, etc). Right ventricular systolic function is moderately reduced. The right ventricular size is mildly enlarged. There is moderately elevated pulmonary artery systolic pressure. The estimated right ventricular systolic pressure is 09.7 mmHg.  3. Right atrial size was moderately dilated.  4. No evidence of mitral valve regurgitation. Moderate mitral annular calcification.  5. Tricuspid valve regurgitation is moderate.  6. The aortic valve was not well visualized. Aortic valve regurgitation is not visualized. No aortic stenosis is present.  7. The inferior vena cava is dilated in size with <50% respiratory variability, suggesting right atrial pressure of 15 mmHg. Comparison(s): Prior images reviewed side by side. The left ventricular function is unchanged. There is marked deterioration in right ventricular function. The inferior vena cava is now plethoric. Findings are consistent with acute right heart failure, possibly due to acute pulmonary embolism or other cause of abrupt and severe increase in pulmonary vascular resistance. FINDINGS  Left Ventricle: Left ventricular ejection fraction, by estimation, is 70 to 75%. The left ventricle has hyperdynamic function. Left ventricular endocardial border not optimally defined to evaluate regional wall motion. The left ventricular internal cavity size was small. There is no left ventricular hypertrophy. The interventricular septum is flattened in systole and diastole, consistent with right ventricular pressure and volume overload. Left ventricular diastolic function could not be evaluated.  Right Ventricle: There is a suggestion of McConnell's sign, which is seen with acute cor pulmonale (e.g. pulmonary embolism, acue respiratory failure, etc). The right ventricular size is mildly enlarged. Right ventricular systolic function is moderately reduced. There is moderately elevated pulmonary artery systolic pressure. The tricuspid regurgitant velocity is 2.99 m/s, and with an assumed right atrial pressure of 15 mmHg, the estimated right ventricular systolic pressure is 35.3 mmHg. Left Atrium: Left atrial size was normal in size. Right Atrium: Right atrial size was moderately dilated. Pericardium: There is no evidence of pericardial effusion. Mitral Valve: Moderate mitral annular calcification. Tricuspid Valve: Tricuspid valve regurgitation is moderate. Aortic Valve: The aortic valve was not well visualized. Aortic valve regurgitation is not visualized. No aortic stenosis is present. Pulmonic Valve: The pulmonic valve was not well visualized. Venous: The inferior vena cava is dilated in size with less than 50% respiratory variability, suggesting right atrial pressure of 15 mmHg. IAS/Shunts: The atrial septum is grossly normal. RIGHT VENTRICLE RV S prime:     8.81 cm/s TAPSE (M-mode): 1.4 cm AORTIC VALVE LVOT Vmax:   94.80 cm/s LVOT Vmean:  69.700 cm/s LVOT VTI:    0.105 m TRICUSPID VALVE TR Peak grad:   35.8 mmHg TR Vmax:        299.00 cm/s  SHUNTS Systemic VTI: 0.10 m Sanda Klein MD Electronically  signed by Sanda Klein MD Signature Date/Time: 05/29/2022/8:41:23 AM    Final    US Abdomen Limited RUQ (LIVER/GB)  Result Date: 05/29/2022 CLINICAL DATA:  66 year old female with abnormal LFTs. EXAM: ULTRASOUND ABDOMEN LIMITED RIGHT UPPER QUADRANT COMPARISON:  CT Abdomen and Pelvis 09/03/2020. FINDINGS: Gallbladder: Sludge and shadowing echogenic gallstones, individually up to 10 mm (image 12). Mild gallbladder wall thickening of up to 4 mm with trace pericholecystic fluid. Gallbladder appears partially  contracted. No sonographic Murphy sign elicited. Common bile duct: Diameter: 2-3 mm, normal. Liver: Small volume perihepatic ascites. No discrete liver lesion, but nodular liver contour such as on image 29. Portal vein is patent on color Doppler imaging with normal direction of blood flow towards the liver. Other: Right pleural effusion is visible (image 35). Negative visible right kidney. IMPRESSION: 1. Nodular liver contour and small volume ascites suspicious for Cirrhosis. No discrete liver lesion by ultrasound. 2. Gallstones with sludge and mild gallbladder wall thickening. However, no sonographic Murphy sign elicited. Favor these changes are reactive secondary to #1 rather than due to acute cholecystitis. 3. No evidence of bile duct obstruction. 4. Right pleural effusion. Electronically Signed   By: Genevie Ann M.D.   On: 05/29/2022 08:00   DG CHEST PORT 1 VIEW  Result Date: 05/29/2022 CLINICAL DATA:  66 year old female respiratory distress, intubated. EXAM: PORTABLE CHEST 1 VIEW COMPARISON:  Portable chest 05/28/2022 and earlier. FINDINGS: Portable AP semi upright view at 0412 hours. Stable lines and tubes. Less rotated today. Confluent and rounded 4.5 cm right perihilar opacity, although right lung ventilation appears improved since 05/27/2022. Patchy and streaky multifocal left perihilar opacity is stable. No pneumothorax or pleural effusion. No areas of worsening ventilation. Stable cardiac size and mediastinal contours. Paucity of bowel gas in the upper abdomen. Prior cervical ACDF. No acute osseous abnormality identified. IMPRESSION: 1.  Stable lines and tubes. 2. Bilateral pneumonia suspected, with right lung ventilation mildly improved since 05/27/2022. 3. No new cardiopulmonary abnormality. Electronically Signed   By: Genevie Ann M.D.   On: 05/29/2022 05:07   ECHOCARDIOGRAM LIMITED BUBBLE STUDY  Result Date: 05/28/2022    ECHOCARDIOGRAM LIMITED REPORT   Patient Name:   LAMIRACLE CHAIDEZ Date of Exam:  05/28/2022 Medical Rec #:  [id number]      Height:       66.0 in Accession #:    [id number]     Weight:       195.5 lb Date of Birth:  11-27-1955      BSA:          1.981 m Patient Age:    2 years       BP:           98/55 mmHg Patient Gender: F              HR:           101 bpm. Exam Location:  Inpatient Procedure: Limited Echo, Color Doppler, Cardiac Doppler and Saline Contrast            Bubble Study Indications:    patent foramen ovale  History:        Patient has prior history of Echocardiogram examinations, most                 recent 05/24/2022. Sepsis. Meningitis. Pneumonia.; Risk                 Factors:Diabetes, Hypertension and Dyslipidemia.  Sonographer:    Dewey Referring Phys:  [id number] DANIEL C SMITH  Sonographer Comments: Image acquisition challenging due to respiratory motion. IMPRESSIONS  1. Left ventricular ejection fraction, by estimation, is 70 to 75%. The left ventricle has hyperdynamic function. The left ventricle has no regional wall motion abnormalities. Left ventricular diastolic function could not be evaluated.  2. Right ventricular systolic function is normal. The right ventricular size is normal.  3. The mitral valve is normal in structure. No evidence of mitral valve regurgitation.  4. Aortic valve regurgitation is not visualized. No aortic stenosis is present.  5. The inferior vena cava is normal in size with greater than 50% respiratory variability, suggesting right atrial pressure of 3 mmHg.  6. Agitated saline contrast bubble study was negative, with no evidence of any interatrial shunt. Comparison(s): No significant change from prior study. Prior images reviewed side by side. FINDINGS  Left Ventricle: Left ventricular ejection fraction, by estimation, is 70 to 75%. The left ventricle has hyperdynamic function. The left ventricle has no regional wall motion abnormalities. The left ventricular internal cavity size was normal in size. There is no left ventricular  hypertrophy. Left ventricular diastolic function could not be evaluated. Right Ventricle: The right ventricular size is normal. No increase in right ventricular wall thickness. Right ventricular systolic function is normal. Left Atrium: Left atrial size was normal in size. Right Atrium: Right atrial size was normal in size. Mitral Valve: The mitral valve is normal in structure. Mild mitral annular calcification. Tricuspid Valve: The tricuspid valve is grossly normal. Aortic Valve: Aortic valve regurgitation is not visualized. No aortic stenosis is present. Pulmonic Valve: The pulmonic valve was not well visualized. Venous: The inferior vena cava is normal in size with greater than 50% respiratory variability, suggesting right atrial pressure of 3 mmHg. IAS/Shunts: Agitated saline contrast was given intravenously to evaluate for intracardiac shunting. Agitated saline contrast bubble study was negative, with no evidence of any interatrial shunt. IVC IVC diam: 1.80 cm Sanda Klein MD Electronically signed by Sanda Klein MD Signature Date/Time: 05/28/2022/1:02:45 PM    Final    DG CHEST PORT 1 VIEW  Result Date: 05/28/2022 CLINICAL DATA:  66 year old female with a history of respiratory failure EXAM: PORTABLE CHEST 1 VIEW COMPARISON:  05/27/2022 FINDINGS: Right rotation limits evaluation. Unchanged cardiomediastinal silhouette. Mixed interstitial and airspace disease again demonstrated bilateral lungs, with improved airspace opacity at the right lung base. Endotracheal tube remains, terminating approximately 5.2 cm above the carina. Unchanged left upper extremity PICC. Surgical changes of the cervical region. No pneumothorax or large pleural effusion. IMPRESSION: Redemonstration of bilateral mixed interstitial and airspace disease, somewhat improved at the right lung base Unchanged endotracheal tube terminating suitably above the carina. Unchanged left upper extremity PICC Electronically Signed   By: Corrie Mckusick D.O.   On: 05/28/2022 10:17   DG Chest Port 1 View  Result Date: 05/27/2022 CLINICAL DATA:  Ventilator dependent respiratory failure. EXAM: PORTABLE CHEST 1 VIEW COMPARISON:  05/25/2022. FINDINGS: 5:19 a.m. ETT tip is 3.1 cm from the carina. NGT enters the stomach with the intragastric course is not filmed. Left PICC terminates at the superior cavoatrial junction. The cardiac size is borderline. The central vessels are normal caliber. There is worsening patchy consolidation in the left upper and right lower lung fields most likely due to multilobar pneumonia. Aspiration component possible. Scattered opacities in the left lower lung field are unaltered. Right upper lung field is essentially clear.  Thoracic spondylosis. IMPRESSION: Worsening bilateral opacities consistent with worsening bilateral pneumonia. Electronically Signed  By: Telford Nab M.D.   On: 05/27/2022 07:24   MR CERVICAL SPINE W WO CONTRAST  Result Date: 05/25/2022 CLINICAL DATA:  Myelopathy, acute, cervical spine EXAM: MRI CERVICAL SPINE WITHOUT AND WITH CONTRAST TECHNIQUE: Multiplanar and multiecho pulse sequences of the cervical spine, to include the craniocervical junction and cervicothoracic junction, were obtained without and with intravenous contrast. CONTRAST:  39m GADAVIST GADOBUTROL 1 MMOL/ML IV SOLN COMPARISON:  CT neck 09/03/2020. FINDINGS: Alignment: Straightening of the normal cervical lordosis. Vertebrae: Prior ACDF from C5-C7. No evidence of acute fracture no evidence of discitis. Cord: There is abnormal cord signal at C4-C5 which does not enhance. No enhancing spinal canal collection. Posterior Fossa, vertebral arteries, paraspinal tissues: Atherosclerosis of the right vertebral artery. Preserved flow voids. Enlarged multinodular goiter, similar to prior CT in November 2021. Disc levels: The craniocervical junction is unremarkable. C2-C3: No significant stenosis.  Mild left facet effusion. C3-C4: Mild uncovertebral  joint hypertrophy. Mild right neural foraminal narrowing. No spinal canal stenosis. Bilateral facet effusions, right greater than left. Bilateral facet effusions right greater than left. C4-C5: Severe disc height loss with disc bulging, uncovertebral joint hypertrophy and bilateral facet arthropathy. There is moderate spinal canal stenosis with and indentation of the cord and mild associated abnormal cord signal. There is severe left and moderate right neural foraminal stenosis. Right facet effusion. C5-C6: Prior ACDF. No significant spinal stenosis. Mild uncovertebral joint hypertrophy. Moderate right neural foraminal stenosis. Mild left neural foraminal stenosis C6-C7: Prior ACDF. No significant spinal canal stenosis. Mild uncovertebral joint hypertrophy. Mild to moderate right neural foraminal stenosis. No left neural foraminal stenosis. C7-T1: Right paracentral/foraminal disc extrusion with endplate spurring results in moderate right lateral recess stenosis and mild neural foraminal stenosis. Upper chest: There is multifocal airspace disease in the lung apices, as seen on recent chest radiograph. IMPRESSION: Prior ACDF from C5-C7. Residual moderate right neural foraminal stenosis at C5-6 and mild-to-moderate neural foraminal stenosis on the right at C6-C7. Patent spinal canal at these levels. Severe degenerative disc disease at C4-C5 with endplate spurring and facet arthropathy results in moderate spinal canal stenosis and mild abnormal cord signal suggesting congestive myelopathy. Severe left and moderate right neural foraminal stenosis at this level. Right paracentral/foraminal disc extrusion at C7-T1 with endplate spurring results in moderate right lateral recess and mild neural foraminal stenosis. Multifocal airspace disease in the lung apices concerning for pneumonia. Electronically Signed   By: JMaurine SimmeringM.D.   On: 05/25/2022 17:42   MR BRAIN W WO CONTRAST  Result Date: 05/25/2022 CLINICAL DATA:   Altered mental status EXAM: MRI HEAD WITHOUT AND WITH CONTRAST TECHNIQUE: Multiplanar, multiecho pulse sequences of the brain and surrounding structures were obtained without and with intravenous contrast. CONTRAST:  862mGADAVIST GADOBUTROL 1 MMOL/ML IV SOLN COMPARISON:  05/20/2022 CT head, no prior MRI head FINDINGS: Brain: No restricted diffusion to suggest acute or subacute infarct. No acute hemorrhage, mass, mass effect, or midline shift. FLAIR hyperintense signal along the cortex, primarily posteriorly (series 6, image 26 for example) suggestive of extra-axial material. No abnormal parenchymal or leptomeningeal enhancement. Along the left posterior frontal/anterior parietal convexity, there is a dural-based partially calcified enhancing mass which measures up to 9 x 19 x 6 mm (AP x TR x CC) (series 20, image 46 and series 21, image 14), most likely a meningioma. No definite underlying T2 hyperintense signal. Advanced cerebral volume loss for age. T2 hyperintense signal in the periventricular white matter, likely the sequela of chronic small vessel ischemic disease.  Vascular: Normal arterial flow voids. Normal arterial and venous enhancement. Skull and upper cervical spine: Normal marrow signal. Sinuses/Orbits: Mucosal thickening in the ethmoid air cells and maxillary sinuses. The orbits are unremarkable. Other: Fluid in the mastoid air cells. IMPRESSION: 1. Abnormal CSF signal, primarily posteriorly, which is nonspecific and may represent infection/meningitis and/or hemorrhage. 2. Enhancing extra-axial lesion along the left frontoparietal convexity, most likely a meningioma, without evidence of underlying mass effect. 3. No acute or subacute infarct. These results will be called to the ordering clinician or representative by the Radiologist Assistant, and communication documented in the PACS or [employer]. Electronically Signed   By: Merilyn Baba M.D.   On: 05/25/2022 17:28   DG Chest Port 1  View  Addendum Date: 05/25/2022   ADDENDUM REPORT: 05/25/2022 08:31 ADDENDUM: Left upper extremity PICC with tip projecting in the region of the lower SVC. The PICC no longer curls medially, suggesting it may no longer be within the azygous. Recommend continued imaging follow-up. Electronically Signed   By: Margaretha Sheffield M.D.   On: 05/25/2022 08:31   Result Date: 05/25/2022 CLINICAL DATA:  ET tube present. EXAM: PORTABLE CHEST 1 VIEW COMPARISON:  None Available. FINDINGS: ET tube approximately 5 cm above the carina. Improved aeration of the lungs with improved but persistent left greater than right perihilar opacities. No visible pleural effusions or pneumothorax. Cardiomediastinal silhouette is unchanged. Gastric tube courses below the diaphragm with the tip outside the field of view. Partially imaged lumbar spinal fusion. IMPRESSION: 1. ET tube approximately 5 cm above the carina. 2. Improved aeration of the lungs with improved but persistent left greater than right perihilar opacities Electronically Signed: By: Margaretha Sheffield M.D. On: 05/25/2022 08:19   Overnight EEG with video  Result Date: 05/25/2022 Lora Havens, MD     05/25/2022  9:50 AM Patient Name: SHAYLEIGH BOULDIN MRN: [id number] Epilepsy Attending: Lora Havens Referring Physician/Provider: Greta Doom, MD  Duration: 05/24/2022 2041 to 05/25/2022 1324 Patient history: 66yo F with ams. EEG to evaluate for seizure Level of alertness: lethargic, asleep AEDs during EEG study: propofol Technical aspects: This EEG study was done with scalp electrodes positioned according to the 10-20 International system of electrode placement. Electrical activity was acquired at a sampling rate of '500Hz'$  and reviewed with a high frequency filter of '70Hz'$  and a low frequency filter of '1Hz'$ . EEG data were recorded continuously and digitally stored. Description: During awake state, no clear posterior dominant rhythm was seen. Sleep was characterized by  sleep spindles (12 to 14 Hz), maximal frontocentral region. EEG showed near continuous generalized polymorphic 3 to 6 Hz theta-delta slowing admixed with 1 to 2 seconds of generalized EEG attenuation. Event button was pressed on 05/24/2022 at 2147 and on 05/26/2022 at 0601 for unclear reasons. Concomitant EEG before, during and after the event did not show any EEG changes suggest seizure. Hyperventilation and photic stimulation were not performed.   ABNORMALITY - Continuous slow, generalized IMPRESSION: This study is suggestive of severe diffuse encephalopathy, nonspecific etiology but could be due to sedation.  No seizures or epileptiform discharges were seen throughout the recording. Two events were recorded as described above without concomitant EEG change. These episodes are most likely not epileptic. Priyanka [name]   DG CHEST PORT 1 VIEW  Result Date: 05/24/2022 CLINICAL DATA:  Status post PICC placement EXAM: PORTABLE CHEST 1 VIEW COMPARISON:  05/24/2022 at 0049 hours FINDINGS: Left arm PICC terminates in the mid SVC with this tip pointed  towards the azygous vein. Endotracheal tube terminates 5 cm above the carina. Enteric tube courses into the stomach. Multifocal pneumonia. Masslike opacity in the left upper lobe. No pleural effusion or pneumothorax. The heart is normal in size. Cervical spine fixation hardware, incompletely visualized. IMPRESSION: Left arm PICC terminates in the mid SVC with this tip pointed towards the azygous vein. Otherwise unchanged. Electronically Signed   By: Julian Hy M.D.   On: 05/24/2022 23:50   ECHOCARDIOGRAM COMPLETE  Result Date: 05/24/2022    ECHOCARDIOGRAM REPORT   Patient Name:   KELLYJO EDGREN Date of Exam: 05/24/2022 Medical Rec #:  [id number]      Height:       66.0 in Accession #:    [id number]     Weight:       177.2 lb Date of Birth:  Mar 09, 1956      BSA:          1.900 m Patient Age:    43 years       BP:           106/56 mmHg Patient Gender: F               HR:           79 bpm. Exam Location:  Inpatient Procedure: 2D Echo, Cardiac Doppler and Color Doppler Indications:    Abnormal EKG  History:        Patient has prior history of Echocardiogram examinations. Risk                 Factors:Hypertension and Diabetes.  Sonographer:    Jyl Heinz Referring Phys: [id number] STEPHANIE M REESE  Sonographer Comments: Echo performed with patient supine and on artificial respirator and suboptimal parasternal window. IMPRESSIONS  1. Left ventricular ejection fraction, by estimation, is 60 to 65%. The left ventricle has normal function. The left ventricle has no regional wall motion abnormalities. Left ventricular diastolic parameters are consistent with Grade I diastolic dysfunction (impaired relaxation).  2. Right ventricular systolic function is normal. The right ventricular size is normal. Tricuspid regurgitation signal is inadequate for assessing PA pressure.  3. The mitral valve is grossly normal. No evidence of mitral valve regurgitation.  4. No significant AI. The aortic valve was not well visualized. There is mild calcification of the aortic valve.  5. The inferior vena cava is dilated in size with >50% respiratory variability, suggesting right atrial pressure of 8 mmHg. Comparison(s): No significant change from prior study. FINDINGS  Left Ventricle: Left ventricular ejection fraction, by estimation, is 60 to 65%. The left ventricle has normal function. The left ventricle has no regional wall motion abnormalities. The left ventricular internal cavity size was normal in size. There is  no left ventricular hypertrophy. Left ventricular diastolic parameters are consistent with Grade I diastolic dysfunction (impaired relaxation). Right Ventricle: The right ventricular size is normal. No increase in right ventricular wall thickness. Right ventricular systolic function is normal. Tricuspid regurgitation signal is inadequate for assessing PA pressure. Left [facility]: Left atrial size  was normal in size. Right [facility]: Right atrial size was normal in size. Pericardium: There is no evidence of pericardial effusion. Mitral Valve: The mitral valve is grossly normal. No evidence of mitral valve regurgitation. Tricuspid Valve: The tricuspid valve is normal in structure. Tricuspid valve regurgitation is not demonstrated. Aortic Valve: No significant AI. The aortic valve was not well visualized. There is mild calcification of the aortic valve. Aortic valve peak gradient measures 14.3 mmHg.  Pulmonic Valve: The pulmonic valve was not well visualized. Pulmonic valve regurgitation is not visualized. Aorta: The aortic root and ascending aorta are structurally normal, with no evidence of dilitation. Venous: The inferior vena cava is dilated in size with greater than 50% respiratory variability, suggesting right atrial pressure of 8 mmHg. IAS/Shunts: No atrial level shunt detected by color flow Doppler.  LEFT VENTRICLE PLAX 2D LVIDd:         5.20 cm     Diastology LVIDs:         4.20 cm     LV e' medial:    8.16 cm/s LV PW:         0.90 cm     LV E/e' medial:  14.0 LV IVS:        0.80 cm     LV e' lateral:   7.40 cm/s LVOT diam:     1.80 cm     LV E/e' lateral: 15.4 LV SV:         68 LV SV Index:   36 LVOT Area:     2.54 cm  LV Volumes (MOD) LV vol d, MOD A2C: 77.6 ml LV vol d, MOD A4C: 84.3 ml LV vol s, MOD A2C: 30.5 ml LV vol s, MOD A4C: 34.7 ml LV SV MOD A2C:     47.1 ml LV SV MOD A4C:     84.3 ml LV SV MOD BP:      47.7 ml RIGHT VENTRICLE             IVC RV Basal diam:  3.10 cm     IVC diam: 2.40 cm RV Mid diam:    2.20 cm RV S prime:     12.70 cm/s TAPSE (M-mode): 2.6 cm LEFT ATRIUM             Index        RIGHT ATRIUM           Index LA diam:        3.70 cm 1.95 cm/m   RA Area:     12.80 cm LA Vol (A2C):   40.1 ml 21.11 ml/m  RA Volume:   29.20 ml  15.37 ml/m LA Vol (A4C):   54.0 ml 28.42 ml/m LA Biplane Vol: 51.4 ml 27.06 ml/m  AORTIC VALVE AV Area (Vmax): 1.79 cm AV Vmax:        189.00 cm/s AV  Peak Grad:   14.3 mmHg LVOT Vmax:      133.00 cm/s LVOT Vmean:     104.000 cm/s LVOT VTI:       0.266 m  AORTA Ao Root diam: 2.60 cm Ao Asc diam:  3.00 cm MITRAL VALVE MV Area (PHT): 4.17 cm     SHUNTS MV Decel Time: 182 msec     Systemic VTI:  0.27 m MV E velocity: 114.00 cm/s  Systemic Diam: 1.80 cm MV A velocity: 132.00 cm/s MV E/A ratio:  0.86 Phineas Inches Electronically signed by Phineas Inches Signature Date/Time: 05/24/2022/12:37:03 PM    Final    EEG adult  Result Date: 05/24/2022 Lora Havens, MD     05/24/2022 12:32 PM Patient Name: ELAN MCELVAIN MRN: [id number] Epilepsy Attending: Lora Havens Referring Physician/Provider: Candee Furbish, MD Date: 05/24/2022 Duration: 21.25 mins Patient history: 66yo F with ams. EEG to evaluate for seizure Level of alertness: Awake AEDs during EEG study: Propofol Technical aspects: This EEG study was done with scalp electrodes positioned according to  the 10-20 International system of electrode placement. Electrical activity was acquired at a sampling rate of '500Hz'$  and reviewed with a high frequency filter of '70Hz'$  and a low frequency filter of '1Hz'$ . EEG data were recorded continuously and digitally stored. Description: EEG showed continuous generalized 3 to 6 Hz theta-delta slowing. Hyperventilation and photic stimulation were not performed.   Of note, eeg was technically difficult due to significant myogenic artifact. ABNORMALITY - Continuous slow, generalized IMPRESSION: This technically difficult study is suggestive of severe diffuse encephalopathy, nonspecific etiology. No seizures or epileptiform discharges were seen throughout the recording. Priyanka O Yadav   [place] EKG SITE RITE  Result Date: 05/24/2022 If [facility] image not attached, placement could not be confirmed due to current cardiac rhythm.  US THYROID  Result Date: 05/24/2022 CLINICAL DATA:  Thyroid nodule, previous left thyroid biopsy EXAM: THYROID ULTRASOUND TECHNIQUE: Ultrasound examination  of the thyroid gland and adjacent soft tissues was performed. COMPARISON:  09/29/2021 FINDINGS: Parenchymal Echotexture: Mildly heterogenous Isthmus: 4 mm Right lobe: 2.8 x 0.9 x 1.6 cm Left lobe: 5.4 x 2.7 x 3.1 cm _________________________________________________________ Estimated total number of nodules >/= 1 cm: 1 Number of spongiform nodules >/=  2 cm not described below (TR1): 0 Number of mixed cystic and solid nodules >/= 1.5 cm not described below (TR2): 0 _________________________________________________________ The previously biopsied left mid thyroid TR 3 nodule is difficult to image today because of suboptimal positioning due to the patient's size and being intubated. No right thyroid abnormality. No regional adenopathy. No hypervascularity. IMPRESSION: Limited thyroid ultrasound. Previously biopsied left mid thyroid nodule is not as well demonstrated today as detailed above. No gross interval change. No definite new thyroid abnormality. The above is in keeping with the ACR TI-RADS recommendations - J Am Coll Radiol 2017;14:587-595. Electronically Signed   By: Jerilynn Mages.  Shick M.D.   On: 05/24/2022 09:02   DG CHEST PORT 1 VIEW  Result Date: 05/24/2022 CLINICAL DATA:  OG tube placement EXAM: PORTABLE CHEST 1 VIEW COMPARISON:  05/24/2022 at 0020 hours FINDINGS: Endotracheal tumor terminates 3 cm above the carina. Enteric tube courses into the stomach. Otherwise unchanged. Multifocal patchy opacities, suggesting pneumonia, although masslike central left upper lobe opacity warrants attention on follow-up. No pleural effusion or pneumothorax. The heart is normal in size. IMPRESSION: Endotracheal tumor terminates 3 cm above the carina. Enteric tube courses into the stomach. Otherwise unchanged. Electronically Signed   By: Julian Hy M.D.   On: 05/24/2022 01:59   DG CHEST PORT 1 VIEW  Result Date: 05/24/2022 CLINICAL DATA:  Pneumonia EXAM: PORTABLE CHEST 1 VIEW COMPARISON:  CT chest dated 09/03/2020  FINDINGS: Patchy/masslike opacity in the central left upper lobe. Additional mild patchy opacities in the right upper lobe and bilateral lower lobes, suggesting multifocal pneumonia. No pleural effusion or pneumothorax. The heart is normal in size. Cervical spine fixation hardware. IMPRESSION: Suspected multifocal pneumonia. Masslike opacity in the central left upper lobe, presumably related to the suspected infectious process, but warranting attention on follow-up. Follow-up CT chest is suggested in 4-6 weeks to document clearance. If the patient does not have infectious symptoms, CT chest with contrast suggested at this time. Electronically Signed   By: Julian Hy M.D.   On: 05/24/2022 00:45   CT Head Wo Contrast  Result Date: 05/20/2022 CLINICAL DATA:  Dizziness. EXAM: CT HEAD WITHOUT CONTRAST TECHNIQUE: Contiguous axial images were obtained from the base of the skull through the vertex without intravenous contrast. RADIATION DOSE REDUCTION: This exam was performed  according to the departmental dose-optimization program which includes automated exposure control, adjustment of the mA and/or kV according to patient size and/or use of iterative reconstruction technique. COMPARISON:  None Available. FINDINGS: Brain: Mild chronic ischemic white matter disease is noted. No mass effect or midline shift is noted. Ventricular size is within normal limits. There is no evidence of mass lesion, hemorrhage or acute infarction. Vascular: No hyperdense vessel or unexpected calcification. Skull: Normal. Negative for fracture or focal lesion. Sinuses/Orbits: No acute finding. Other: None. IMPRESSION: No acute intracranial abnormality seen. Electronically Signed   By: Marijo Conception M.D.   On: 05/20/2022 09:40    Assessment and Plan:  1.  Small lymphocytic B-cell non-Hodgkin lymphoma 2.  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  Acute hypoxemic respiratory failure secondary to multilobar  pneumonia 4.  Sudden onset shock, acute renal failure, acute liver failure, RV dilation, severe metabolic acidosis 5.  Suspected saddle pulmonary embolism 6.  UTI 7.  Diabetes mellitus  -The patient has been receiving maintenance rituximab for her small lymphocytic B-cell non-Hodgkin lymphoma.  This may predispose her to hypogammaglobulinemia.  We will check serum immunoglobulin levels. -Continue management of other medical conditions per PCCM, ID, nephrology, neurology.  Mikey Bussing, DNP, AGPCNP-BC, AOCNP   ADDENDUM: I saw and examined Ms. Bland Span this morning.  I am incredibly disappointed as to what is happened to her.  I have never seen patient getting Rituxan developed meningoencephalitis.  I am sure that this is a very rare complication of Rituxan.  She really has not been all that hypogammaglobulinemic.  Her last IgG level was 537 mg/dL.  She did have a spinal tap on 05/24/2022.  There was not enough cells for a flow cytometry to see if there is any lymphoma in the spinal fluid.  I am unsure what cultures showed if any.  She did have some macrophages in the spinal fluid.  I cannot tell if she is had a spinal tap.  I do not see any results from a spinal tap.  Her LFTs are incredibly elevated.  She was on dialysis this morning.  Her glucose has been quite elevated.  Yesterday glucose was 830.  I think she has had hyperkalemia.  There is so much going on.  I am not sure she is going to survive this.  I really really hate this.  I just wish there was more definitive information as to what triggered all of this.  I know that she is getting incredible care from the wonderful staff down the CCU.  I very much appreciate their compassion in their professional care.  I have always like Ms. Moman.  She is always been so much fun to talk to whenever she would come into the office.  I know my staff is certainly not happy about all this.  My nurses have voiced enjoyed taking care of her.  She  is what been so kind and considerate to our staff.   Lattie Haw, MD  Psalms 28:7

## 2022-05-30 NOTE — Progress Notes (Signed)
ABG results: pH 7.44 PaCO2 36 PaO2 87 HC03 25  Vent RR decreased from 32 to 28 per CCM.

## 2022-05-30 NOTE — Progress Notes (Signed)
LTM maint complete - no skin breakdown under: Fp1 Fp2 Service A2 [facility] monitored, Event button test confirmed by [facility].

## 2022-05-30 NOTE — Progress Notes (Addendum)
[place] for heparin Indication: pulmonary embolus  Allergies  Allergen Reactions   Canagliflozin Other (See Comments)    recurrent vaginitis   Glucophage [Metformin] Other (See Comments)    GI intolerance   Niaspan [Niacin] Other (See Comments)    flushing    Patient Measurements: Height: 5' 5.98" (167.6 cm) Weight: 94.3 kg (207 lb 14.3 oz) IBW/kg (Calculated) : 59.25 Heparin Dosing Weight: 94.3kg  Vital Signs: Temp: 99.1 F (37.3 C) (07/31 1300) Temp Source: Esophageal (07/31 0800) BP: 113/58 (07/31 1200) Pulse Rate: 88 (07/31 1300)  Labs: Recent Labs    05/29/22 0229 05/29/22 0546 05/29/22 1002 05/29/22 1226 05/29/22 1801 05/29/22 1835 05/29/22 1930 05/30/22 0455 05/30/22 1153  HGB 9.4*  --   --  10.0*  --   --   --  8.4*  --   HCT 32.8*  --   --  34.7*  --   --   --  27.2*  --   PLT 403*  --   --  260  --   --   --  148*  --   APTT  --   --  40*  --   --   --   --   --   --   LABPROT  --   --  23.3*  --   --   --   --   --   --   INR  --   --  2.1*  --   --   --   --   --   --   HEPARINUNFRC  --   --   --   --   --    < > <0.10* 0.12* 0.13*  CREATININE 2.22*   < >  --  2.87* 2.79*  --   --  2.03*  --    < > = values in this interval not displayed.     Estimated Creatinine Clearance: 31.1 mL/min (A) (by C-G formula based on SCr of 2.03 mg/dL (H)).   Medical History: Past Medical History:  Diagnosis Date   Allergy    Atherosclerotic heart disease of native coronary artery without angina pectoris 01/13/2020   Blood in stool    Carotid artery occlusion 08/13/2009   Carotid stenosis    Cervical lymphadenopathy 10/31/2019   CLL (chronic lymphocytic leukemia) ([place]) 78/29/5621   Complication of anesthesia    Coronary artery calcification seen on CT scan 06/01/2020   Diabetes mellitus without complication ([place])    Diabetic peripheral neuropathy associated with type 2 diabetes mellitus ([place]) 06/08/2009   Elevated liver  function tests    Essential hypertension 11/08/2019   Fatty liver 09/06/2012   Goals of care, counseling/discussion 10/28/2019   Hardening of the aorta (main artery of the heart) ([place]) 01/13/2020   History of adenomatous polyp of colon 03/16/2021   Hyperglycemia due to type 2 diabetes mellitus ([place]) 03/09/2021   Hyperlipidemia    Hypertension    Hypertriglyceridemia 03/16/2021   Long term (current) use of insulin ([place]) 03/09/2021   Lumbar radiculopathy 11/30/2021   Lymphadenopathy, abdominal 10/28/2019   Malignant lymphoma, small lymphocytic ([place]) 11/13/2019   Mixed dyslipidemia 06/01/2020   Obesity 04/19/2012   Personal history of COVID-19 11/13/2019   Pneumonia due to COVID-19 virus 11/08/2019   PONV (postoperative nausea and vomiting)    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 Pt with ongoing fevers and worsening clinical status 7/29, RV severely down on beside ECHO so concerns for acute PE. Pt received alteplase on 7/30 for suspected saddle PE.    Heparin level of 0.13 is subtherapeutic on heparin 1500 units/hr. No overt bleeding noted. Hemoglobin and platelets trending down but stable.   Goal of Therapy:  Heparin level 0.3-0.7, increased because > 24 hours from TPA  Monitor platelets by anticoagulation protocol: Yes   Plan:  Give 1x bolus of 1500 units (~ 15u/kg)  Increase heparin infusion to 1750 units/hr (18 units/kg)  Check 8 hr heparin level  Monitor daily heparin level and CBC Continue to monitor H&H     Esmeralda Arthur, PharmD  Clinical Pharmacist 05/30/2022 2:13 PM

## 2022-05-30 NOTE — Progress Notes (Signed)
LTM EEG hooked up and running - no initial skin breakdown - push button tested - neuro notified. [facility] monitoring.  

## 2022-05-30 NOTE — Progress Notes (Signed)
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   Discharge summary reviewed: She initially presented to OSH on 05/20/22 for left upper extremity tremors. She reported recent hand foot any mouth disease contracted from her grandchild that left her weak and tired prior to this admission. Neurology work-up including MRI, EEG negative. MRI/MRA neg for acute infarct, 13 mm focus in left frontal lobe representing artifact vs small calcified meningioma, chronic small vessel ischemic changes, cerebral atrophy, high-grade stenosis in bilateral ICA, focal stenosis in left ACA and intracranial right vertebral artery. MRI of C-spine showing C4-5 cord compression from advanced disc degeneration with posterior endplate ridging.  Showing left foraminal impingement at the same level.  Also showing C5-6 and C6-7 ADCF with solid arthrodesis.  [facility]urologist at [facility] had recommended neurosurgical evaluation.  She developed fevers and transferred to ICU concerning for meningitis and started on Vanc, rocephin, ampicillin and acyclovir. LP was performed on 7/24 with WBC 55 RBC 2. Pending herpes, enterovirus, ehrlichia. CT A/P showed bilateral ground glass and lung opacities concerning for multifocal pneumonia.   PCCM consulted for decreased responsiveness and hypoxemia on NRB. On exam obtunded and only able to say name. O2 sats in the 70s on NRB. Transferred to the ICU for emergent intubation   Pertinent  Medical History  As above  Significant Hospital Events: Including procedures,  antibiotic start and stop dates in addition to other pertinent events   7/24 Admitted to [facility] from [place]. Transferred to PCCM when found in respiratory failure 7/25 intubated, bronched, art line placed, PICC placed.  Neuro and ID consulted. Started decadron  7/26 decadron dc/d 7/27 felt most consistent with viral meningoencephalitis  7/29-7/30 sudden onset multiorgan failure  Interim History / Subjective:  LFTs continue to titrate up Vasopressor requirement is coming down currently on vasopressin, Levophed is turned off Serum bicarbonate has improved to 27  Objective   Blood pressure (!) 112/56, pulse 90, temperature 98.8 F (37.1 C), temperature source Esophageal, resp. rate (!) 35, height 5' 5.98" (1.676 m), weight 94.3 kg, SpO2 96 %. CVP:  [10 mmHg-16 mmHg] 16 mmHg  Vent Mode: PRVC FiO2 (%):  [50 %-100 %] 50 % Set Rate:  [35 bmp] 35 bmp Vt Set:  [470 mL-480 mL] 480 mL PEEP:  [12 cmH20-14 cmH20] 12 cmH20 Plateau Pressure:  [27 cmH20-30 cmH20] 27 cmH20   Intake/Output Summary (Last 24 hours) at 05/30/2022 1019 Last data filed at 05/30/2022 1000 Gross per 24 hour  Intake 4201.85 ml  Output 3444 ml  Net 757.85 ml   Filed Weights   05/27/22 0458 05/28/22 0500 05/30/22 0500  Weight: 90.9 kg 88.7 kg 94.3 kg   Physical Exam:   Physical exam: General: Crtitically ill-appearing female, orally intubated HEENT: [place]/AT, eyes anicteric.  ETT and OGT in place Neuro: Sedated, not following commands.  Eyes are closed.  Pupils 3 mm bilateral reactive to light Chest: Coarse breath sounds, no wheezes or rhonchi Heart: Regular rate and rhythm, no murmurs or gallops Abdomen: Soft, nontender, nondistended, bowel sounds present Skin: No rash   Resolved Hospital Problem list  N/A  Assessment & Plan:   Acute encephalopathy in setting of viral meningoencephalitis  Minimize sedation, continue titrate fentanyl down with RASS goal -1 Continue droplet precautions  Acute  hypoxemic/hypercapnic respiratory failure 2/2 multilobar PNA  Trach aspirate is growing oral flora, follow-up culture until his speciate Continue antibiotics with vanc/meropenem Continue lung protective ventilation Not ready for SBT  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  HSV OSH neg, culture neg, arbovirus panel penidng. Critical cervical myopathy - question baseline severe DJD with meningeal swelling threatening critical stenosis cont droplet  Appreciate ID and neuro following  Continue IV steroid Arboviral panel pending in [place]  F/u enterovirus PCR from [place] added on by [facility] 7/29 -anticipate that recovery from viral process could take weeks/months  UTI Positive UA at OSH Urine culture was not sent Meropenem should cover   DM2 with severe hyperglycemia  She has labile blood sugars, currently continue with insulin infusion, once stabilized we will try to switch her to long-acting  Small lymphocytic B cell non-hodgkin lymphoma On maintenance rituximab (last received April 05 2022)  Follows Dr. Marin Olp, he was notified Outpatient follow-up with oncology  Sudden onset shock, acute renal failure, acute liver failure, RV dilation, large dead space, severe metabolic acidosis Suspected saddle pulmonary embolism Patient received tPA, with improvement in hemodynamics Continue IV heparin Patient was started on CRRT Bicarbonate infusion was stopped Nephrology is following LFTs continue to trend up, though her clinical status is better than yesterday Closely monitor electrolytes and bicarbonate  Best Practice (right click and "Reselect all SmartList Selections" daily)   Diet/type: Resume tube feed DVT prophylaxis: heparin gtt GI prophylaxis: PPI Lines: PICC  Foley:  Yes, and it is still needed  Code Status:  full code Last date of multidisciplinary goals of care discussion -  05/24/22. Family updated 7/31 at bedside   Total critical care time: 49 minutes  Performed by: [place] care time was exclusive of separately billable procedures and treating other patients.   Critical care was necessary to treat or prevent imminent or life-threatening deterioration.   Critical care was time spent personally by me on the following activities: development of treatment plan with patient and/or surrogate as well as nursing, discussions with consultants, evaluation of patient's response to treatment, examination of patient, obtaining history from patient or surrogate, ordering and performing treatments and interventions, ordering and review of laboratory studies, ordering and review of radiographic studies, pulse oximetry and re-evaluation of patient's condition.   Jacky Kindle, MD [place] [facility] See Amion for pager If no response to pager, please call [phone] until 7pm After 7pm, Please call E-link [phone]

## 2022-05-30 NOTE — Progress Notes (Addendum)
[facility] Infectious Diseases Follow Up Note  Patient Identification: Patient Name: Valerie Cline MRN: [id number] [place] Date: 05/16/2022  9:11 PM Age: 66 y.o.Today's Date: 05/30/2022  Reason for Visit: Viral meningoencephalitis  Principal Problem:   Meningitis Active Problems:   Severe sepsis ([place])   Acute metabolic encephalopathy   Acute respiratory failure with hypoxia (HCC)   Multifocal pneumonia   Meningioma (HCC)   Cervical myelopathy (HCC)   UTI (urinary tract infection)   Antibiotics:  Meropenem 7/28-current Vancomycin 7/24-7/26, Vancomycin 7/30-current  Ceftriaxone 7/24, 7/27 Cefepime 7/25-7/26 Ampicillin 7/24-7/25 Acyclovir 7/24-7/26  Lines/Hardwares: RT IJ non tunneled HD Catheter, Left UE PICC, Left radial art line   Interval Events: Off vasopressin by late morning    Assessment #Possible viral meningoencephalitis in the setting of recent hand-foot and mouth disease with positive enterovirus in RVP in an immunocompromised patient Per [facility] Microbiology  CSF cx 7/24 NG, gram stain negative, HSV1 and 2 DNA negative  ( CSF WBC 55, N 84%, L 5%, M 2%, mesothelial cells and other cells 10%, RBC 2) Blood cx 7/21 and 7/22 negative) RMSF IgM and IgG + Lyme total ab 2/09 negative Ehrlichia PCR and Entero virus PCR from CSF pending   # Acute Respiratory Failure in the setting of encephalopathy + multolobar PNA 7/25 BAL cx AFB negative, gram stain and cx negative, fungal cx pending  7/28 tracheal aspirate cx normal resp flora.   #Small lymphocytic B cell non-Hodgkin lymphoma On maintenance rituximab, last received June, 2023.   Follows Dr. Marin Olp  #Suspected saddle pulmonary embolism in the setting of sudden onset shock/acute renal and liver failure ( Sock liver), RV dilatation with severe metabolic acidosis Status post tPA with improvement in hemodynamics, on IV heparin On CRRT, nephrology following 7/30  Liver US s/o cirrhosis  # Critical Cervical Myelopathy/ACDF C5-C7/Progressive Weakness and Tremors  On solumedrol, Neurology following   Recommendations OK to DC Vancomycin ( MRSA PCR negative, trach cx 7/28 no growth) Continue meropenem  Fu Ehrlichia PCR/arbo virus PCR from CSF at [facility] Will recheck Hepatitis B and C serology given liver cirrhosis Fu Final BAL cx Monitor CMP  Following  Rest of the management as per the primary team. Thank you for the consult. Please page with pertinent questions or concerns. _____________________________________________________________________ Subjective patient seen and examined at the bedside. Spoke with sister at bedside.  On Fentanyl, heparin, insulin, low dose vasopressin   Past Medical History:  Diagnosis Date   Allergy    Atherosclerotic heart disease of native coronary artery without angina pectoris 01/13/2020   Blood in stool    Carotid artery occlusion 08/13/2009   Carotid stenosis    Cervical lymphadenopathy 10/31/2019   CLL (chronic lymphocytic leukemia) ([place]) 47/07/6282   Complication of anesthesia    Coronary artery calcification seen on CT scan 06/01/2020   Diabetes mellitus without complication ([place])    Diabetic peripheral neuropathy associated with type 2 diabetes mellitus ([place]) 06/08/2009   Elevated liver function tests    Essential hypertension 11/08/2019   Fatty liver 09/06/2012   Goals of care, counseling/discussion 10/28/2019   Hardening of the aorta (main artery of the heart) ([place]) 01/13/2020   History of adenomatous polyp of colon 03/16/2021   Hyperglycemia due to type 2 diabetes mellitus ([place]) 03/09/2021   Hyperlipidemia    Hypertension    Hypertriglyceridemia 03/16/2021   Long term (current) use of insulin ([place]) 03/09/2021   Lumbar radiculopathy 11/30/2021   Lymphadenopathy, abdominal 10/28/2019   Malignant lymphoma, small lymphocytic ([place])  11/13/2019   Mixed dyslipidemia 06/01/2020   Obesity 04/19/2012    Personal history of COVID-19 11/13/2019   Pneumonia due to COVID-19 virus 11/08/2019   PONV (postoperative nausea and vomiting)    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 ([place]) 12/30/2019   T2DM (type 2 diabetes mellitus) ([place]) 11/08/2019   Thyroid nodule 03/16/2021   Type 2 diabetes mellitus with unspecified complications ([place]) 45/62/5638   Past Surgical History:  Procedure Laterality Date   BREAST BIOPSY Right    needle biopsy   COLONOSCOPY     POLYPECTOMY     Ruptured disc      Vitals BP (!) 113/58   Pulse 88   Temp 99.1 F (37.3 C)   Resp (!) 35   Ht 5' 5.98" (1.676 m)   Wt 94.3 kg   SpO2 98%   BMI 33.58 kg/m   Physical Exam Constitutional: Orotracheally intubated, on CRRT    Comments:   Cardiovascular:     Rate and Rhythm: Normal rate and regular rhythm.     Heart sounds:   Pulmonary:     Effort: Pulmonary effort is normal on mechanical ventilation    Comments: Coarse breath sounds  Abdominal:     Palpations: Abdomen is soft.     Tenderness: Nondistended and nontender  Musculoskeletal:        General: No swelling or tenderness in peripheral joints  Skin:    Comments: RT IJ non tunneled HD Catheter, Left UE PICC, Left radial art line   Neurological:     General: Unresponsive   Pertinent Microbiology Results for orders placed or performed during the hospital encounter of 04/30/2022  MRSA Next Gen by PCR, Nasal     Status: None   Collection Time: 05/21/2022  9:42 PM   Specimen: Nasal Mucosa; Nasal Swab  Result Value Ref Range Status   MRSA by PCR Next Gen NOT DETECTED NOT DETECTED Final    Comment: (NOTE) The GeneXpert MRSA Assay (FDA approved for NASAL specimens only), is one component of a comprehensive MRSA colonization surveillance program. It is not intended to diagnose MRSA infection nor to guide or monitor treatment for MRSA infections. Test performance is not FDA approved in patients less than 66 years old. Performed at [facility], [place] [address], [place], [place] [address]   Urine Culture     Status: None   Collection Time: 05/24/22  2:11 AM   Specimen: Urine, Clean Catch  Result Value Ref Range Status   Specimen Description URINE, CLEAN CATCH  Final   Special Requests NONE  Final   Culture   Final    NO GROWTH Performed at [facility], [address] [place], [place] [address]    Report Status 05/25/2022 FINAL  Final  Culture, blood (Routine X 2) w Reflex to ID Panel     Status: None   Collection Time: 05/24/22  6:16 AM   Specimen: BLOOD RIGHT HAND  Result Value Ref Range Status   Specimen Description BLOOD RIGHT HAND  Final   Special Requests   Final    BOTTLES DRAWN AEROBIC AND ANAEROBIC Blood Culture adequate volume   Culture   Final    NO GROWTH 5 DAYS Performed at [facility], [place] [address] [place], [place] [address]    Report Status 05/29/2022 FINAL  Final  Culture, blood (Routine X 2) w Reflex to ID Panel     Status: None  Collection Time: 05/24/22  6:17 AM   Specimen: BLOOD LEFT HAND  Result Value Ref Range Status   Specimen Description BLOOD LEFT HAND  Final   Special Requests   Final    BOTTLES DRAWN AEROBIC AND ANAEROBIC Blood Culture adequate volume   Culture   Final    NO GROWTH 5 DAYS Performed at [facility], [address] [place] [address], [place], [place] [address]    Report Status 05/29/2022 FINAL  Final  Culture, Respiratory w Gram Stain     Status: None   Collection Time: 05/24/22  9:24 AM   Specimen: Bronchoalveolar Lavage; Respiratory  Result Value Ref Range Status   Specimen Description BRONCHIAL ALVEOLAR LAVAGE  Final   Special Requests Immunocompromised  Final   Gram Stain   Final    RARE WBC PRESENT, PREDOMINANTLY MONONUCLEAR NO ORGANISMS SEEN    Culture   Final    NO GROWTH 2 DAYS Performed at [facility], [address] [place], [place] [address]    Report Status 05/26/2022 FINAL  Final  Acid Fast Smear (AFB)     Status: None    Collection Time: 05/24/22  9:24 AM   Specimen: Bronchial Alveolar Lavage  Result Value Ref Range Status   AFB Specimen Processing Concentration  Final   Acid Fast Smear Negative  Final    Comment: (NOTE) Performed At: [facility] [place], [place] [id number] Rush Farmer MD [id number]    Source (AFB) BRONCHIAL ALVEOLAR LAVAGE  Final    Comment: Performed at [facility], [place] [address] [place], [place] [address]  Fungus Culture With Stain     Status: None (Preliminary result)   Collection Time: 05/24/22  9:24 AM   Specimen: Bronchial Alveolar Lavage  Result Value Ref Range Status   Fungus Stain Final report  Final    Comment: (NOTE) Performed At: [facility] [place], [place] [id number] Rush Farmer MD [id number]    Fungus (Mycology) Culture PENDING  Incomplete   Fungal Source BRONCHIAL ALVEOLAR LAVAGE  Final    Comment: Performed at [facility], [place] [address], [place], [place] [address]  Respiratory (~20 pathogens) panel by PCR     Status: None   Collection Time: 05/24/22  9:24 AM   Specimen: Bronchial Alveolar Lavage; Respiratory  Result Value Ref Range Status   Adenovirus NOT DETECTED NOT DETECTED Final   Coronavirus 229E NOT DETECTED NOT DETECTED Final    Comment: (NOTE) The Coronavirus on the Respiratory Panel, DOES NOT test for the novel  Coronavirus (2019 nCoV)    Coronavirus HKU1 NOT DETECTED NOT DETECTED Final   Coronavirus NL63 NOT DETECTED NOT DETECTED Final   Coronavirus OC43 NOT DETECTED NOT DETECTED Final   Metapneumovirus NOT DETECTED NOT DETECTED Final   Rhinovirus / Enterovirus NOT DETECTED NOT DETECTED Final   Influenza A NOT DETECTED NOT DETECTED Final   Influenza B NOT DETECTED NOT DETECTED Final   Parainfluenza Virus 1 NOT DETECTED NOT DETECTED Final   Parainfluenza Virus 2 NOT DETECTED NOT DETECTED Final   Parainfluenza Virus 3 NOT DETECTED NOT DETECTED Final   Parainfluenza Virus  4 NOT DETECTED NOT DETECTED Final   Respiratory Syncytial Virus NOT DETECTED NOT DETECTED Final   Bordetella pertussis NOT DETECTED NOT DETECTED Final   Bordetella Parapertussis NOT DETECTED NOT DETECTED Final   Chlamydophila pneumoniae NOT DETECTED NOT DETECTED Final   Mycoplasma pneumoniae NOT DETECTED NOT DETECTED Final  Comment: Performed at [facility], [place] [address] [place], [place] [address]  Fungus Culture Result     Status: None   Collection Time: 05/24/22  9:24 AM  Result Value Ref Range Status   Result 1 Comment  Final    Comment: (NOTE) KOH/Calcofluor preparation:  no fungus observed. Performed At: [facility] [place], [place] [id number] Rush Farmer MD ZH:[id number]   Culture, Respiratory w Gram Stain     Status: None   Collection Time: 05/27/22  8:31 AM   Specimen: Tracheal Aspirate; Respiratory  Result Value Ref Range Status   Specimen Description TRACHEAL ASPIRATE  Final   Special Requests NONE  Final   Gram Stain   Final    ABUNDANT WBC PRESENT, PREDOMINANTLY PMN FEW GRAM NEGATIVE RODS RARE GRAM POSITIVE COCCI IN PAIRS    Culture   Final    Normal respiratory flora-no Staph aureus or Pseudomonas seen Performed at [facility], [address], [place], [place] [address]    Report Status 05/29/2022 FINAL  Final  Respiratory (~20 pathogens) panel by PCR     Status: Abnormal   Collection Time: 05/28/22  1:53 PM   Specimen: Nasopharyngeal Swab; Respiratory  Result Value Ref Range Status   Adenovirus NOT DETECTED NOT DETECTED Final   Coronavirus 229E NOT DETECTED NOT DETECTED Final    Comment: (NOTE) The Coronavirus on the Respiratory Panel, DOES NOT test for the novel  Coronavirus (2019 nCoV)    Coronavirus HKU1 NOT DETECTED NOT DETECTED Final   Coronavirus NL63 NOT DETECTED NOT DETECTED Final   Coronavirus OC43 NOT DETECTED NOT DETECTED Final   Metapneumovirus NOT DETECTED NOT DETECTED Final   Rhinovirus /  Enterovirus DETECTED (A) NOT DETECTED Final   Influenza A NOT DETECTED NOT DETECTED Final   Influenza B NOT DETECTED NOT DETECTED Final   Parainfluenza Virus 1 NOT DETECTED NOT DETECTED Final   Parainfluenza Virus 2 NOT DETECTED NOT DETECTED Final   Parainfluenza Virus 3 NOT DETECTED NOT DETECTED Final   Parainfluenza Virus 4 NOT DETECTED NOT DETECTED Final   Respiratory Syncytial Virus NOT DETECTED NOT DETECTED Final   Bordetella pertussis NOT DETECTED NOT DETECTED Final   Bordetella Parapertussis NOT DETECTED NOT DETECTED Final   Chlamydophila pneumoniae NOT DETECTED NOT DETECTED Final   Mycoplasma pneumoniae NOT DETECTED NOT DETECTED Final    Comment: Performed at [facility], [place] [address] [place], [place] [address]  Culture, blood (Routine X 2) w Reflex to ID Panel     Status: None (Preliminary result)   Collection Time: 05/29/22  8:58 AM   Specimen: BLOOD RIGHT HAND  Result Value Ref Range Status   Specimen Description BLOOD RIGHT HAND  Final   Special Requests   Final    BOTTLES DRAWN AEROBIC AND ANAEROBIC Blood Culture adequate volume   Culture   Final    NO GROWTH 1 DAY Performed at [facility], [place] [address], [place], [place] [address]    Report Status PENDING  Incomplete  Culture, blood (Routine X 2) w Reflex to ID Panel     Status: None (Preliminary result)   Collection Time: 05/29/22  8:58 AM   Specimen: BLOOD RIGHT HAND  Result Value Ref Range Status   Specimen Description BLOOD RIGHT HAND  Final   Special Requests IN PEDIATRIC BOTTLE Blood Culture adequate volume  Final   Culture   Final    NO GROWTH 1 DAY Performed at  [facility], [place] [address], [place], [place] [address]    Report Status PENDING  Incomplete   Pertinent Lab.    Latest Ref Rng & Units 05/30/2022    4:55 AM 05/29/2022   12:26 PM 05/29/2022    2:29 AM  CBC  WBC 4.0 - 10.5 K/uL 9.1  20.2  20.5   Hemoglobin 12.0 - 15.0 g/dL 8.4  10.0  9.4   Hematocrit 36.0 - 46.0 % 27.2   34.7  32.8   Platelets 150 - 400 K/uL 148  260  403       Latest Ref Rng & Units 05/30/2022    4:55 AM 05/29/2022    6:01 PM 05/29/2022   12:26 PM  CMP  Glucose 70 - 99 mg/dL 166  257  342   BUN 8 - 23 mg/dL 48  70  69   Creatinine 0.44 - 1.00 mg/dL 2.03  2.79  2.87   Sodium 135 - 145 mmol/L 143  146  144   Potassium 3.5 - 5.1 mmol/L 4.3  4.7  5.9   Chloride 98 - 111 mmol/L 102  104  102   CO2 22 - 32 mmol/L _0 Calcium 8.9 - 10.3 mg/dL 7.6  7.1  7.3   Total Protein 6.5 - 8.1 g/dL 5.3     Total Bilirubin 0.3 - 1.2 mg/dL 1.5     Alkaline Phos 38 - 126 U/L 153     AST 15 - 41 U/L 9,211     ALT 0 - 44 U/L 4,999        Pertinent Imaging today Plain films and CT images have been personally visualized and interpreted; radiology reports have been reviewed. Decision making incorporated into the Impression / Recommendations.  CT HEAD WO CONTRAST (5MM)  Result Date: 05/29/2022 CLINICAL DATA:  Mental status changes. Persistent or worsening. Suspected viral meningitis. EXAM: CT HEAD WITHOUT CONTRAST TECHNIQUE: Contiguous axial images were obtained from the base of the skull through the vertex without intravenous contrast. RADIATION DOSE REDUCTION: This exam was performed according to the departmental dose-optimization program which includes automated exposure control, adjustment of the mA and/or kV according to patient size and/or use of iterative reconstruction technique. COMPARISON:  CT head without contrast 05/20/2022. MR head without and with contrast 05/25/2022 FINDINGS: Brain: Sulci are poorly seen over the posterior parietal lobes and occipital lobes. Mild atrophy and white matter changes are present. Ventricles are stable in size. No discrete extra-axial fluid collections are present. The brainstem and cerebellum are within normal limits. Vascular: Atherosclerotic calcifications are again seen within the cavernous internal carotid arteries. No hyperdense vessel present. Skull:  Calvarium is intact. No focal lytic or blastic lesions are present. No significant extracranial soft tissue lesion is present. Sinuses/Orbits: Right greater than left mastoid effusions are new. No significant middle ear effusions are present. The paranasal sinuses and mastoid air cells are otherwise clear. The globes and orbits are within normal limits. IMPRESSION: 1. Sulci are poorly seen over the posterior parietal lobes and occipital lobes. This is consistent with abnormal CSF signal and suspected meningitis. 2. No discrete extra-axial fluid collections. 3. Stable atrophy and white matter disease. 4. New right greater than left mastoid effusions. Electronically Signed   By: San Morelle M.D.   On: 05/29/2022 17:33   DG Chest Port 1 View  Result Date: 05/29/2022 CLINICAL DATA:  Evaluate line placement. EXAM: PORTABLE CHEST 1 VIEW COMPARISON:  May 29, 2022  FINDINGS: The ETT is in good position. A right central line terminates in the central SVC. A left PICC line terminates in the central SVC. No pneumothorax. Bilateral patchy pulmonary infiltrates have worsened in the interval, particularly in the right mid lung. The cardiomediastinal silhouette is stable. No other acute abnormalities. IMPRESSION: 1. Support apparatus as above. Specifically, the new right central line is in good position with no pneumothorax. 2. Worsening bilateral pulmonary infiltrates, particularly in the right mid lung. Electronically Signed   By: Dorise Bullion III M.D.   On: 05/29/2022 09:50   VAS [place] LOWER EXTREMITY VENOUS (DVT)  Result Date: 05/29/2022  Lower Venous DVT Study Patient Name:  CHANELLE HODSDON  Date of Exam:   05/28/2022 Medical Rec #: [id number]       Accession #:    [id number] Date of Birth: 01/01/1956       Patient Gender: F Patient Age:   70 years Exam Location:  [facility] Procedure:      VAS [place] LOWER EXTREMITY VENOUS (DVT) Referring Phys: Ina Homes  --------------------------------------------------------------------------------  Indications: Edema.  Risk Factors: Ventilation. Limitations: Ventilation. Enlarged bladder. Positioning. Comparison Study: No prior study Performing Technologist: Sharion Dove RVS  Examination Guidelines: A complete evaluation includes B-mode imaging, spectral Doppler, color Doppler, and power Doppler as needed of all accessible portions of each vessel. Bilateral testing is considered an integral part of a complete examination. Limited examinations for reoccurring indications may be performed as noted. The reflux portion of the exam is performed with the patient in reverse Trendelenburg.  +---------+---------------+---------+-----------+----------+--------------+ RIGHT    CompressibilityPhasicitySpontaneityPropertiesThrombus Aging +---------+---------------+---------+-----------+----------+--------------+ CFV      Full           No       No                                  +---------+---------------+---------+-----------+----------+--------------+ SFJ      Partial                                                     +---------+---------------+---------+-----------+----------+--------------+ FV Prox  Full           No       No                                  +---------+---------------+---------+-----------+----------+--------------+ FV Mid   Full           No       No                                  +---------+---------------+---------+-----------+----------+--------------+ FV DistalFull           No       No                                  +---------+---------------+---------+-----------+----------+--------------+ PFV      Full           No       No                                  +---------+---------------+---------+-----------+----------+--------------+  POP      Full           No       No                                   +---------+---------------+---------+-----------+----------+--------------+ PTV      Full           No       No                                  +---------+---------------+---------+-----------+----------+--------------+ PERO     Full           No       No                                  +---------+---------------+---------+-----------+----------+--------------+ Gastroc                 No       No                                  +---------+---------------+---------+-----------+----------+--------------+                                                                      +---------+---------------+---------+-----------+----------+--------------+   +---------+---------------+---------+-----------+----------+--------------+ LEFT     CompressibilityPhasicitySpontaneityPropertiesThrombus Aging +---------+---------------+---------+-----------+----------+--------------+ CFV      Full           No       No                                  +---------+---------------+---------+-----------+----------+--------------+ SFJ      Partial                                                     +---------+---------------+---------+-----------+----------+--------------+ FV Prox  Full           No       No                                  +---------+---------------+---------+-----------+----------+--------------+ FV Mid   Full           No       No                                  +---------+---------------+---------+-----------+----------+--------------+ FV DistalFull           No       No                                  +---------+---------------+---------+-----------+----------+--------------+  PFV      Full                                                        +---------+---------------+---------+-----------+----------+--------------+ POP      Full           No       No                                   +---------+---------------+---------+-----------+----------+--------------+ PTV      Full                                                        +---------+---------------+---------+-----------+----------+--------------+ PERO     Full                                                        +---------+---------------+---------+-----------+----------+--------------+ EIV mid  Partial                                                     +---------+---------------+---------+-----------+----------+--------------+     Summary: RIGHT: - Vessels compress, however, they are dilated and waveforms are continuous. IVC is patent as is distal right common iliac vein.  LEFT: - Vessels compress, however, they are dilated and waveforms are continuous. Mid external iliac vein is partially compressible with no color flow noted.  *See table(s) above for measurements and observations. Electronically signed by Jamelle Haring on 05/29/2022 at 9:23:40 AM.    Final    ECHOCARDIOGRAM LIMITED  Result Date: 05/29/2022    ECHOCARDIOGRAM LIMITED REPORT   Patient Name:   TANEKIA RYANS Date of Exam: 05/29/2022 Medical Rec #:  [id number]      Height:       66.0 in Accession #:    [id number]     Weight:       195.5 lb Date of Birth:  1955-11-05      BSA:          1.981 m Patient Age:    31 years       BP:           153/61 mmHg Patient Gender: F              HR:           114 bpm. Exam Location:  Inpatient Procedure: Limited Echo, Color Doppler and Cardiac Doppler STAT ECHO Indications:    Acute Ischemic Heart Disease  History:        Patient has prior history of Echocardiogram examinations, most                 recent 05/28/2022. Risk Factors:Hypertension, Diabetes and                 Dyslipidemia.  Sonographer:    Raquel Sarna Senior RDCS Referring Phys: [id number] [place]  1. Left ventricular ejection fraction, by estimation, is 70 to 75%. The left ventricle has hyperdynamic function. Left ventricular endocardial border  not optimally defined to evaluate regional wall motion. Left ventricular diastolic function could not be evaluated. There is the interventricular septum is flattened in systole and diastole, consistent with right ventricular pressure and volume overload.  2. There is a suggestion of McConnell's sign, which is seen with acute cor pulmonale (e.g. pulmonary embolism, acue respiratory failure, etc). Right ventricular systolic function is moderately reduced. The right ventricular size is mildly enlarged. There is moderately elevated pulmonary artery systolic pressure. The estimated right ventricular systolic pressure is 62.1 mmHg.  3. Right atrial size was moderately dilated.  4. No evidence of mitral valve regurgitation. Moderate mitral annular calcification.  5. Tricuspid valve regurgitation is moderate.  6. The aortic valve was not well visualized. Aortic valve regurgitation is not visualized. No aortic stenosis is present.  7. The inferior vena cava is dilated in size with <50% respiratory variability, suggesting right atrial pressure of 15 mmHg. Comparison(s): Prior images reviewed side by side. The left ventricular function is unchanged. There is marked deterioration in right ventricular function. The inferior vena cava is now plethoric. Findings are consistent with acute right heart failure, possibly due to acute pulmonary embolism or other cause of abrupt and severe increase in pulmonary vascular resistance. FINDINGS  Left Ventricle: Left ventricular ejection fraction, by estimation, is 70 to 75%. The left ventricle has hyperdynamic function. Left ventricular endocardial border not optimally defined to evaluate regional wall motion. The left ventricular internal cavity size was small. There is no left ventricular hypertrophy. The interventricular septum is flattened in systole and diastole, consistent with right ventricular pressure and volume overload. Left ventricular diastolic function could not be evaluated.  Right Ventricle: There is a suggestion of McConnell's sign, which is seen with acute cor pulmonale (e.g. pulmonary embolism, acue respiratory failure, etc). The right ventricular size is mildly enlarged. Right ventricular systolic function is moderately reduced. There is moderately elevated pulmonary artery systolic pressure. The tricuspid regurgitant velocity is 2.99 m/s, and with an assumed right atrial pressure of 15 mmHg, the estimated right ventricular systolic pressure is 30.8 mmHg. Left Atrium: Left atrial size was normal in size. Right Atrium: Right atrial size was moderately dilated. Pericardium: There is no evidence of pericardial effusion. Mitral Valve: Moderate mitral annular calcification. Tricuspid Valve: Tricuspid valve regurgitation is moderate. Aortic Valve: The aortic valve was not well visualized. Aortic valve regurgitation is not visualized. No aortic stenosis is present. Pulmonic Valve: The pulmonic valve was not well visualized. Venous: The inferior vena cava is dilated in size with less than 50% respiratory variability, suggesting right atrial pressure of 15 mmHg. IAS/Shunts: The atrial septum is grossly normal. RIGHT VENTRICLE RV S prime:     8.81 cm/s TAPSE (M-mode): 1.4 cm AORTIC VALVE LVOT Vmax:   94.80 cm/s LVOT Vmean:  69.700 cm/s LVOT VTI:    0.105 m TRICUSPID VALVE TR Peak grad:   35.8 mmHg TR Vmax:        299.00 cm/s  SHUNTS Systemic VTI: 0.10 m Dani Gobble Croitoru MD Electronically signed by Sanda Klein MD Signature Date/Time: 05/29/2022/8:41:23 AM    Final    US Abdomen Limited RUQ (LIVER/GB)  Result Date: 05/29/2022 CLINICAL DATA:  66 year old female with abnormal LFTs. EXAM: ULTRASOUND ABDOMEN LIMITED RIGHT UPPER QUADRANT COMPARISON:  CT Abdomen and Pelvis 09/03/2020. FINDINGS:  Gallbladder: Sludge and shadowing echogenic gallstones, individually up to 10 mm (image 12). Mild gallbladder wall thickening of up to 4 mm with trace pericholecystic fluid. Gallbladder appears partially  contracted. No sonographic Murphy sign elicited. Common bile duct: Diameter: 2-3 mm, normal. Liver: Small volume perihepatic ascites. No discrete liver lesion, but nodular liver contour such as on image 29. Portal vein is patent on color Doppler imaging with normal direction of blood flow towards the liver. Other: Right pleural effusion is visible (image 35). Negative visible right kidney. IMPRESSION: 1. Nodular liver contour and small volume ascites suspicious for Cirrhosis. No discrete liver lesion by ultrasound. 2. Gallstones with sludge and mild gallbladder wall thickening. However, no sonographic Murphy sign elicited. Favor these changes are reactive secondary to #1 rather than due to acute cholecystitis. 3. No evidence of bile duct obstruction. 4. Right pleural effusion. Electronically Signed   By: Genevie Ann M.D.   On: 05/29/2022 08:00   DG CHEST PORT 1 VIEW  Result Date: 05/29/2022 CLINICAL DATA:  66 year old female respiratory distress, intubated. EXAM: PORTABLE CHEST 1 VIEW COMPARISON:  Portable chest 05/28/2022 and earlier. FINDINGS: Portable AP semi upright view at 0412 hours. Stable lines and tubes. Less rotated today. Confluent and rounded 4.5 cm right perihilar opacity, although right lung ventilation appears improved since 05/27/2022. Patchy and streaky multifocal left perihilar opacity is stable. No pneumothorax or pleural effusion. No areas of worsening ventilation. Stable cardiac size and mediastinal contours. Paucity of bowel gas in the upper abdomen. Prior cervical ACDF. No acute osseous abnormality identified. IMPRESSION: 1.  Stable lines and tubes. 2. Bilateral pneumonia suspected, with right lung ventilation mildly improved since 05/27/2022. 3. No new cardiopulmonary abnormality. Electronically Signed   By: Genevie Ann M.D.   On: 05/29/2022 05:07   ECHOCARDIOGRAM LIMITED BUBBLE STUDY  Result Date: 05/28/2022    ECHOCARDIOGRAM LIMITED REPORT   Patient Name:   HULDAH MARIN Date of Exam:  05/28/2022 Medical Rec #:  [id number]      Height:       66.0 in Accession #:    [id number]     Weight:       195.5 lb Date of Birth:  [birth year]      BSA:          1.981 m Patient Age:    89 years       BP:           98/55 mmHg Patient Gender: F              HR:           101 bpm. Exam Location:  Inpatient Procedure: Limited Echo, Color Doppler, Cardiac Doppler and Saline Contrast            Bubble Study Indications:    patent foramen ovale  History:        Patient has prior history of Echocardiogram examinations, most                 recent 05/24/2022. Sepsis. Meningitis. Pneumonia.; Risk                 Factors:Diabetes, Hypertension and Dyslipidemia.  Sonographer:    Johny Chess RDCS Referring Phys: [id number] Candee Furbish  Sonographer Comments: Image acquisition challenging due to respiratory motion. IMPRESSIONS  1. Left ventricular ejection fraction, by estimation, is 70 to 75%. The left ventricle has hyperdynamic function. The left ventricle has no regional wall motion abnormalities. Left ventricular diastolic function could not  be evaluated.  2. Right ventricular systolic function is normal. The right ventricular size is normal.  3. The mitral valve is normal in structure. No evidence of mitral valve regurgitation.  4. Aortic valve regurgitation is not visualized. No aortic stenosis is present.  5. The inferior vena cava is normal in size with greater than 50% respiratory variability, suggesting right atrial pressure of 3 mmHg.  6. Agitated saline contrast bubble study was negative, with no evidence of any interatrial shunt. Comparison(s): No significant change from prior study. Prior images reviewed side by side. FINDINGS  Left Ventricle: Left ventricular ejection fraction, by estimation, is 70 to 75%. The left ventricle has hyperdynamic function. The left ventricle has no regional wall motion abnormalities. The left ventricular internal cavity size was normal in size. There is no left ventricular  hypertrophy. Left ventricular diastolic function could not be evaluated. Right Ventricle: The right ventricular size is normal. No increase in right ventricular wall thickness. Right ventricular systolic function is normal. Left Atrium: Left atrial size was normal in size. Right Atrium: Right atrial size was normal in size. Mitral Valve: The mitral valve is normal in structure. Mild mitral annular calcification. Tricuspid Valve: The tricuspid valve is grossly normal. Aortic Valve: Aortic valve regurgitation is not visualized. No aortic stenosis is present. Pulmonic Valve: The pulmonic valve was not well visualized. Venous: The inferior vena cava is normal in size with greater than 50% respiratory variability, suggesting right atrial pressure of 3 mmHg. IAS/Shunts: Agitated saline contrast was given intravenously to evaluate for intracardiac shunting. Agitated saline contrast bubble study was negative, with no evidence of any interatrial shunt. IVC IVC diam: 1.80 cm Sanda Klein MD Electronically signed by Sanda Klein MD Signature Date/Time: 05/28/2022/1:02:45 PM    Final    DG CHEST PORT 1 VIEW  Result Date: 05/28/2022 CLINICAL DATA:  66 year old female with a history of respiratory failure EXAM: PORTABLE CHEST 1 VIEW COMPARISON:  05/27/2022 FINDINGS: Right rotation limits evaluation. Unchanged cardiomediastinal silhouette. Mixed interstitial and airspace disease again demonstrated bilateral lungs, with improved airspace opacity at the right lung base. Endotracheal tube remains, terminating approximately 5.2 cm above the carina. Unchanged left upper extremity PICC. Surgical changes of the cervical region. No pneumothorax or large pleural effusion. IMPRESSION: Redemonstration of bilateral mixed interstitial and airspace disease, somewhat improved at the right lung base Unchanged endotracheal tube terminating suitably above the carina. Unchanged left upper extremity PICC Electronically Signed   By: Corrie Mckusick D.O.   On: 05/28/2022 10:17   DG Chest Port 1 View  Result Date: 05/27/2022 CLINICAL DATA:  Ventilator dependent respiratory failure. EXAM: PORTABLE CHEST 1 VIEW COMPARISON:  05/25/2022. FINDINGS: 5:19 a.m. ETT tip is 3.1 cm from the carina. NGT enters the stomach with the intragastric course is not filmed. Left PICC terminates at the superior cavoatrial junction. The cardiac size is borderline. The central vessels are normal caliber. There is worsening patchy consolidation in the left upper and right lower lung fields most likely due to multilobar pneumonia. Aspiration component possible. Scattered opacities in the left lower lung field are unaltered. Right upper lung field is essentially clear.  Thoracic spondylosis. IMPRESSION: Worsening bilateral opacities consistent with worsening bilateral pneumonia. Electronically Signed   By: Telford Nab M.D.   On: 05/27/2022 07:24   MR CERVICAL SPINE W WO CONTRAST  Result Date: 05/25/2022 CLINICAL DATA:  Myelopathy, acute, cervical spine EXAM: MRI CERVICAL SPINE WITHOUT AND WITH CONTRAST TECHNIQUE: Multiplanar and multiecho pulse sequences of the cervical spine,  to include the craniocervical junction and cervicothoracic junction, were obtained without and with intravenous contrast. CONTRAST:  47m GADAVIST GADOBUTROL 1 MMOL/ML IV SOLN COMPARISON:  CT neck 09/03/2020. FINDINGS: Alignment: Straightening of the normal cervical lordosis. Vertebrae: Prior ACDF from C5-C7. No evidence of acute fracture no evidence of discitis. Cord: There is abnormal cord signal at C4-C5 which does not enhance. No enhancing spinal canal collection. Posterior Fossa, vertebral arteries, paraspinal tissues: Atherosclerosis of the right vertebral artery. Preserved flow voids. Enlarged multinodular goiter, similar to prior CT in November 2021. Disc levels: The craniocervical junction is unremarkable. C2-C3: No significant stenosis.  Mild left facet effusion. C3-C4: Mild uncovertebral  joint hypertrophy. Mild right neural foraminal narrowing. No spinal canal stenosis. Bilateral facet effusions, right greater than left. Bilateral facet effusions right greater than left. C4-C5: Severe disc height loss with disc bulging, uncovertebral joint hypertrophy and bilateral facet arthropathy. There is moderate spinal canal stenosis with and indentation of the cord and mild associated abnormal cord signal. There is severe left and moderate right neural foraminal stenosis. Right facet effusion. C5-C6: Prior ACDF. No significant spinal stenosis. Mild uncovertebral joint hypertrophy. Moderate right neural foraminal stenosis. Mild left neural foraminal stenosis C6-C7: Prior ACDF. No significant spinal canal stenosis. Mild uncovertebral joint hypertrophy. Mild to moderate right neural foraminal stenosis. No left neural foraminal stenosis. C7-T1: Right paracentral/foraminal disc extrusion with endplate spurring results in moderate right lateral recess stenosis and mild neural foraminal stenosis. Upper chest: There is multifocal airspace disease in the lung apices, as seen on recent chest radiograph. IMPRESSION: Prior ACDF from C5-C7. Residual moderate right neural foraminal stenosis at C5-6 and mild-to-moderate neural foraminal stenosis on the right at C6-C7. Patent spinal canal at these levels. Severe degenerative disc disease at C4-C5 with endplate spurring and facet arthropathy results in moderate spinal canal stenosis and mild abnormal cord signal suggesting congestive myelopathy. Severe left and moderate right neural foraminal stenosis at this level. Right paracentral/foraminal disc extrusion at C7-T1 with endplate spurring results in moderate right lateral recess and mild neural foraminal stenosis. Multifocal airspace disease in the lung apices concerning for pneumonia. Electronically Signed   By: JMaurine SimmeringM.D.   On: 05/25/2022 17:42   MR BRAIN W WO CONTRAST  Result Date: 05/25/2022 CLINICAL DATA:   Altered mental status EXAM: MRI HEAD WITHOUT AND WITH CONTRAST TECHNIQUE: Multiplanar, multiecho pulse sequences of the brain and surrounding structures were obtained without and with intravenous contrast. CONTRAST:  8109mGADAVIST GADOBUTROL 1 MMOL/ML IV SOLN COMPARISON:  05/20/2022 CT head, no prior MRI head FINDINGS: Brain: No restricted diffusion to suggest acute or subacute infarct. No acute hemorrhage, mass, mass effect, or midline shift. FLAIR hyperintense signal along the cortex, primarily posteriorly (series 6, image 26 for example) suggestive of extra-axial material. No abnormal parenchymal or leptomeningeal enhancement. Along the left posterior frontal/anterior parietal convexity, there is a dural-based partially calcified enhancing mass which measures up to 9 x 19 x 6 mm (AP x TR x CC) (series 20, image 46 and series 21, image 14), most likely a meningioma. No definite underlying T2 hyperintense signal. Advanced cerebral volume loss for age. T2 hyperintense signal in the periventricular white matter, likely the sequela of chronic small vessel ischemic disease. Vascular: Normal arterial flow voids. Normal arterial and venous enhancement. Skull and upper cervical spine: Normal marrow signal. Sinuses/Orbits: Mucosal thickening in the ethmoid air cells and maxillary sinuses. The orbits are unremarkable. Other: Fluid in the mastoid air cells. IMPRESSION: 1. Abnormal CSF signal, primarily posteriorly,  which is nonspecific and may represent infection/meningitis and/or hemorrhage. 2. Enhancing extra-axial lesion along the left frontoparietal convexity, most likely a meningioma, without evidence of underlying mass effect. 3. No acute or subacute infarct. These results will be called to the ordering clinician or representative by the Radiologist Assistant, and communication documented in the PACS or [employer]. Electronically Signed   By: Merilyn Baba M.D.   On: 05/25/2022 17:28   DG Chest Port 1  View  Addendum Date: 05/25/2022   ADDENDUM REPORT: 05/25/2022 08:31 ADDENDUM: Left upper extremity PICC with tip projecting in the region of the lower SVC. The PICC no longer curls medially, suggesting it may no longer be within the azygous. Recommend continued imaging follow-up. Electronically Signed   By: Margaretha Sheffield M.D.   On: 05/25/2022 08:31   Result Date: 05/25/2022 CLINICAL DATA:  ET tube present. EXAM: PORTABLE CHEST 1 VIEW COMPARISON:  None Available. FINDINGS: ET tube approximately 5 cm above the carina. Improved aeration of the lungs with improved but persistent left greater than right perihilar opacities. No visible pleural effusions or pneumothorax. Cardiomediastinal silhouette is unchanged. Gastric tube courses below the diaphragm with the tip outside the field of view. Partially imaged lumbar spinal fusion. IMPRESSION: 1. ET tube approximately 5 cm above the carina. 2. Improved aeration of the lungs with improved but persistent left greater than right perihilar opacities Electronically Signed: By: Margaretha Sheffield M.D. On: 05/25/2022 08:19   Overnight EEG with video  Result Date: 05/25/2022 Lora Havens, MD     05/25/2022  9:50 AM Patient Name: PARTHENA FERGESON MRN: [id number] Epilepsy Attending: Lora Havens Referring Physician/Provider: Greta Doom, MD  Duration: 05/24/2022 2041 to 05/25/2022 8295 Patient history: 66yo F with ams. EEG to evaluate for seizure Level of alertness: lethargic, asleep AEDs during EEG study: propofol Technical aspects: This EEG study was done with scalp electrodes positioned according to the 10-20 International system of electrode placement. Electrical activity was acquired at a sampling rate of _0  and reviewed with a high frequency filter of _1  and a low frequency filter of _2 . EEG data were recorded continuously and digitally stored. Description: During awake state, no clear posterior dominant rhythm was seen. Sleep was characterized by  sleep spindles (12 to 14 Hz), maximal frontocentral region. EEG showed near continuous generalized polymorphic 3 to 6 Hz theta-delta slowing admixed with 1 to 2 seconds of generalized EEG attenuation. Event button was pressed on 05/24/2022 at 2147 and on 05/26/2022 at 0601 for unclear reasons. Concomitant EEG before, during and after the event did not show any EEG changes suggest seizure. Hyperventilation and photic stimulation were not performed.   ABNORMALITY - Continuous slow, generalized IMPRESSION: This study is suggestive of severe diffuse encephalopathy, nonspecific etiology but could be due to sedation.  No seizures or epileptiform discharges were seen throughout the recording. Two events were recorded as described above without concomitant EEG change. These episodes are most likely not epileptic. Priyanka [name]   DG CHEST PORT 1 VIEW  Result Date: 05/24/2022 CLINICAL DATA:  Status post PICC placement EXAM: PORTABLE CHEST 1 VIEW COMPARISON:  05/24/2022 at 0049 hours FINDINGS: Left arm PICC terminates in the mid SVC with this tip pointed towards the azygous vein. Endotracheal tube terminates 5 cm above the carina. Enteric tube courses into the stomach. Multifocal pneumonia. Masslike opacity in the left upper lobe. No pleural effusion or pneumothorax. The heart is normal in size. Cervical spine fixation hardware, incompletely visualized. IMPRESSION: Left arm  PICC terminates in the mid SVC with this tip pointed towards the azygous vein. Otherwise unchanged. Electronically Signed   By: Julian Hy M.D.   On: 05/24/2022 23:50   ECHOCARDIOGRAM COMPLETE  Result Date: 05/24/2022    ECHOCARDIOGRAM REPORT   Patient Name:   DEWAYNE JUREK Date of Exam: 05/24/2022 Medical Rec #:  [id number]      Height:       66.0 in Accession #:    [id number]     Weight:       177.2 lb Date of Birth:  Apr 22, 1956      BSA:          1.900 m Patient Age:    58 years       BP:           106/56 mmHg Patient Gender: F               HR:           79 bpm. Exam Location:  Inpatient Procedure: 2D Echo, Cardiac Doppler and Color Doppler Indications:    Abnormal EKG  History:        Patient has prior history of Echocardiogram examinations. Risk                 Factors:Hypertension and Diabetes.  Sonographer:    Jyl Heinz Referring Phys: [id number] STEPHANIE M REESE  Sonographer Comments: Echo performed with patient supine and on artificial respirator and suboptimal parasternal window. IMPRESSIONS  1. Left ventricular ejection fraction, by estimation, is 60 to 65%. The left ventricle has normal function. The left ventricle has no regional wall motion abnormalities. Left ventricular diastolic parameters are consistent with Grade I diastolic dysfunction (impaired relaxation).  2. Right ventricular systolic function is normal. The right ventricular size is normal. Tricuspid regurgitation signal is inadequate for assessing PA pressure.  3. The mitral valve is grossly normal. No evidence of mitral valve regurgitation.  4. No significant AI. The aortic valve was not well visualized. There is mild calcification of the aortic valve.  5. The inferior vena cava is dilated in size with >50% respiratory variability, suggesting right atrial pressure of 8 mmHg. Comparison(s): No significant change from prior study. FINDINGS  Left Ventricle: Left ventricular ejection fraction, by estimation, is 60 to 65%. The left ventricle has normal function. The left ventricle has no regional wall motion abnormalities. The left ventricular internal cavity size was normal in size. There is  no left ventricular hypertrophy. Left ventricular diastolic parameters are consistent with Grade I diastolic dysfunction (impaired relaxation). Right Ventricle: The right ventricular size is normal. No increase in right ventricular wall thickness. Right ventricular systolic function is normal. Tricuspid regurgitation signal is inadequate for assessing PA pressure. Left Atrium: Left atrial size  was normal in size. Right Atrium: Right atrial size was normal in size. Pericardium: There is no evidence of pericardial effusion. Mitral Valve: The mitral valve is grossly normal. No evidence of mitral valve regurgitation. Tricuspid Valve: The tricuspid valve is normal in structure. Tricuspid valve regurgitation is not demonstrated. Aortic Valve: No significant AI. The aortic valve was not well visualized. There is mild calcification of the aortic valve. Aortic valve peak gradient measures 14.3 mmHg. Pulmonic Valve: The pulmonic valve was not well visualized. Pulmonic valve regurgitation is not visualized. Aorta: The aortic root and ascending aorta are structurally normal, with no evidence of dilitation. Venous: The inferior vena cava is dilated in size with greater than 50% respiratory variability, suggesting right  atrial pressure of 8 mmHg. IAS/Shunts: No atrial level shunt detected by color flow Doppler.  LEFT VENTRICLE PLAX 2D LVIDd:         5.20 cm     Diastology LVIDs:         4.20 cm     LV e' medial:    8.16 cm/s LV PW:         0.90 cm     LV E/e' medial:  14.0 LV IVS:        0.80 cm     LV e' lateral:   7.40 cm/s LVOT diam:     1.80 cm     LV E/e' lateral: 15.4 LV SV:         68 LV SV Index:   36 LVOT Area:     2.54 cm  LV Volumes (MOD) LV vol d, MOD A2C: 77.6 ml LV vol d, MOD A4C: 84.3 ml LV vol s, MOD A2C: 30.5 ml LV vol s, MOD A4C: 34.7 ml LV SV MOD A2C:     47.1 ml LV SV MOD A4C:     84.3 ml LV SV MOD BP:      47.7 ml RIGHT VENTRICLE             IVC RV Basal diam:  3.10 cm     IVC diam: 2.40 cm RV Mid diam:    2.20 cm RV S prime:     12.70 cm/s TAPSE (M-mode): 2.6 cm LEFT ATRIUM             Index        RIGHT ATRIUM           Index LA diam:        3.70 cm 1.95 cm/m   RA Area:     12.80 cm LA Vol (A2C):   40.1 ml 21.11 ml/m  RA Volume:   29.20 ml  15.37 ml/m LA Vol (A4C):   54.0 ml 28.42 ml/m LA Biplane Vol: 51.4 ml 27.06 ml/m  AORTIC VALVE AV Area (Vmax): 1.79 cm AV Vmax:        189.00 cm/s AV  Peak Grad:   14.3 mmHg LVOT Vmax:      133.00 cm/s LVOT Vmean:     104.000 cm/s LVOT VTI:       0.266 m  AORTA Ao Root diam: 2.60 cm Ao Asc diam:  3.00 cm MITRAL VALVE MV Area (PHT): 4.17 cm     SHUNTS MV Decel Time: 182 msec     Systemic VTI:  0.27 m MV E velocity: 114.00 cm/s  Systemic Diam: 1.80 cm MV A velocity: 132.00 cm/s MV E/A ratio:  0.86 Phineas Inches Electronically signed by Phineas Inches Signature Date/Time: 05/24/2022/12:37:03 PM    Final    EEG adult  Result Date: 05/24/2022 Lora Havens, MD     05/24/2022 12:32 PM Patient Name: SEKAI NAYAK MRN: [id number] Epilepsy Attending: Lora Havens Referring Physician/Provider: Candee Furbish, MD Date: 05/24/2022 Duration: 21.25 mins Patient history: 66yo F with ams. EEG to evaluate for seizure Level of alertness: Awake AEDs during EEG study: Propofol Technical aspects: This EEG study was done with scalp electrodes positioned according to the 10-20 International system of electrode placement. Electrical activity was acquired at a sampling rate of _0  and reviewed with a high frequency filter of _1  and a low frequency filter of _2 . EEG data were recorded continuously and digitally stored. Description: EEG showed continuous generalized 3  to 6 Hz theta-delta slowing. Hyperventilation and photic stimulation were not performed.   Of note, eeg was technically difficult due to significant myogenic artifact. ABNORMALITY - Continuous slow, generalized IMPRESSION: This technically difficult study is suggestive of severe diffuse encephalopathy, nonspecific etiology. No seizures or epileptiform discharges were seen throughout the recording. Priyanka O Yadav   [place] EKG [facility]  Result Date: 05/24/2022 If [facility] image not attached, placement could not be confirmed due to current cardiac rhythm.  US THYROID  Result Date: 05/24/2022 CLINICAL DATA:  Thyroid nodule, previous left thyroid biopsy EXAM: THYROID ULTRASOUND TECHNIQUE: Ultrasound examination  of the thyroid gland and adjacent soft tissues was performed. COMPARISON:  09/29/2021 FINDINGS: Parenchymal Echotexture: Mildly heterogenous Isthmus: 4 mm Right lobe: 2.8 x 0.9 x 1.6 cm Left lobe: 5.4 x 2.7 x 3.1 cm _________________________________________________________ Estimated total number of nodules >/= 1 cm: 1 Number of spongiform nodules >/=  2 cm not described below (TR1): 0 Number of mixed cystic and solid nodules >/= 1.5 cm not described below (TR2): 0 _________________________________________________________ The previously biopsied left mid thyroid TR 3 nodule is difficult to image today because of suboptimal positioning due to the patient's size and being intubated. No right thyroid abnormality. No regional adenopathy. No hypervascularity. IMPRESSION: Limited thyroid ultrasound. Previously biopsied left mid thyroid nodule is not as well demonstrated today as detailed above. No gross interval change. No definite new thyroid abnormality. The above is in keeping with the ACR TI-RADS recommendations - J Am Coll Radiol 2017;14:587-595. Electronically Signed   By: Jerilynn Mages.  Shick M.D.   On: 05/24/2022 09:02   DG CHEST PORT 1 VIEW  Result Date: 05/24/2022 CLINICAL DATA:  OG tube placement EXAM: PORTABLE CHEST 1 VIEW COMPARISON:  05/24/2022 at 0020 hours FINDINGS: Endotracheal tumor terminates 3 cm above the carina. Enteric tube courses into the stomach. Otherwise unchanged. Multifocal patchy opacities, suggesting pneumonia, although masslike central left upper lobe opacity warrants attention on follow-up. No pleural effusion or pneumothorax. The heart is normal in size. IMPRESSION: Endotracheal tumor terminates 3 cm above the carina. Enteric tube courses into the stomach. Otherwise unchanged. Electronically Signed   By: Julian Hy M.D.   On: 05/24/2022 01:59   DG CHEST PORT 1 VIEW  Result Date: 05/24/2022 CLINICAL DATA:  Pneumonia EXAM: PORTABLE CHEST 1 VIEW COMPARISON:  CT chest dated 09/03/2020  FINDINGS: Patchy/masslike opacity in the central left upper lobe. Additional mild patchy opacities in the right upper lobe and bilateral lower lobes, suggesting multifocal pneumonia. No pleural effusion or pneumothorax. The heart is normal in size. Cervical spine fixation hardware. IMPRESSION: Suspected multifocal pneumonia. Masslike opacity in the central left upper lobe, presumably related to the suspected infectious process, but warranting attention on follow-up. Follow-up CT chest is suggested in 4-6 weeks to document clearance. If the patient does not have infectious symptoms, CT chest with contrast suggested at this time. Electronically Signed   By: Julian Hy M.D.   On: 05/24/2022 00:45   CT Head Wo Contrast  Result Date: 05/20/2022 CLINICAL DATA:  Dizziness. EXAM: CT HEAD WITHOUT CONTRAST TECHNIQUE: Contiguous axial images were obtained from the base of the skull through the vertex without intravenous contrast. RADIATION DOSE REDUCTION: This exam was performed according to the departmental dose-optimization program which includes automated exposure control, adjustment of the mA and/or kV according to patient size and/or use of iterative reconstruction technique. COMPARISON:  None Available. FINDINGS: Brain: Mild chronic ischemic white matter disease is noted. No mass effect or midline shift  is noted. Ventricular size is within normal limits. There is no evidence of mass lesion, hemorrhage or acute infarction. Vascular: No hyperdense vessel or unexpected calcification. Skull: Normal. Negative for fracture or focal lesion. Sinuses/Orbits: No acute finding. Other: None. IMPRESSION: No acute intracranial abnormality seen. Electronically Signed   By: Marijo Conception M.D.   On: 05/20/2022 09:40     I spent 60 minutes for this patient encounter including review of prior medical records, coordination of care with primary/other specialist with greater than 50% of time being face to face/counseling and  discussing diagnostics/treatment plan with the patient/family.  Electronically signed by:   Rosiland Oz, MD Infectious Disease Physician [facility] for Infectious Disease Pager: [phone]

## 2022-05-30 NOTE — Progress Notes (Signed)
ABG results did not cross over from [place].  pH 7.39 CO2 49 PO2 114 HCO3 30  98% 470-35-60%+14

## 2022-05-30 NOTE — Progress Notes (Signed)
Nutrition Follow-up  DOCUMENTATION CODES:   Not applicable  INTERVENTION:   Tube Feeding via Cortrak: Hold TF for medical instability Vital AF 1.2 at 60 ml/hr This provides 108 g of protein, 1728 kcals, 1166 mL per hour  Add Renal MVI daily  NUTRITION DIAGNOSIS:   Inadequate oral intake related to acute illness as evidenced by NPO status.  Being addressed via TF   GOAL:   Patient will meet greater than or equal to 90% of their needs  Progressing  MONITOR:   Vent status, Labs, Weight trends, TF tolerance  REASON FOR ASSESSMENT:   Consult, Ventilator Enteral/tube feeding initiation and management  ASSESSMENT:   66 yo female admitted with acute respiratory failure secondary to pneumonia, acute encephalopathy secondary to suspected bacterial vs viral meningitis in immunocompromised patient. 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 Initially hyperthermic on cooling blanket; now hypothermic  RN reports TF held over the weekend due to medical instability, restarting today. Tolerating Vital AF 1.2 at 60 ml/hr  Current wt 94.3 kg; admit weight 80.4 kg. Net +14 L since admit  No skin breakdown per RN assessment  Labs: CBGs 118-166, elevated LFTs, phosphorus 5.1 Meds: insulin gtt, solumedrol, miralax, colace  Diet Order:   Diet Order             Diet NPO time specified  Diet effective now                   EDUCATION NEEDS:   Not appropriate for education at this time  Skin:  Skin Assessment: Reviewed RN Assessment  Last BM:  7/1 rectal tube  Height:   Ht Readings from Last 1 Encounters:  05/26/22 5' 5.98" (1.676 m)    Weight:   Wt Readings from Last 1 Encounters:   05/30/22 94.3 kg     BMI:  Body mass index is 33.58 kg/m.  Estimated Nutritional Needs:   Kcal:  1700-1900 kcals  Protein:  90-110 g  Fluid:  >/= 1.7 L   Kerman Passey MS, RDN, LDN, CNSC Registered Dietitian 3 Clinical Nutrition RD Pager and On-Call Pager Number Located in [place]

## 2022-05-30 NOTE — Progress Notes (Signed)
[place] for heparin Indication: pulmonary embolus  Allergies  Allergen Reactions   Canagliflozin Other (See Comments)    recurrent vaginitis   Glucophage [Metformin] Other (See Comments)    GI intolerance   Niaspan [Niacin] Other (See Comments)    flushing    Patient Measurements: Height: 5' 5.98" (167.6 cm) Weight: 94.3 kg (207 lb 14.3 oz) IBW/kg (Calculated) : 59.25 Heparin Dosing Weight: 94.3kg  Vital Signs: Temp: 99.1 F (37.3 C) (07/31 2000) BP: 108/53 (07/31 2000) Pulse Rate: 91 (07/31 2000)  Labs: Recent Labs    05/29/22 0229 05/29/22 0546 05/29/22 1002 05/29/22 1226 05/29/22 1801 05/29/22 1835 05/29/22 1930 05/30/22 0455 05/30/22 1153 05/30/22 1600  HGB 9.4*  --   --  10.0*  --   --   --  8.4*  --   --   HCT 32.8*  --   --  34.7*  --   --   --  27.2*  --   --   PLT 403*  --   --  260  --   --   --  148*  --   --   APTT  --   --  40*  --   --   --   --   --   --   --   LABPROT  --   --  23.3*  --   --   --   --   --   --   --   INR  --   --  2.1*  --   --   --   --   --   --   --   HEPARINUNFRC  --   --   --   --   --    < > <0.10* 0.12* 0.13*  --   CREATININE 2.22*   < >  --  2.87* 2.79*  --   --  2.03*  --  1.77*   < > = values in this interval not displayed.     Estimated Creatinine Clearance: 35.7 mL/min (A) (by C-G formula based on SCr of 1.77 mg/dL (H)).   Assessment: 72 yoF admitted with meningitis from OSH. Pt with ongoing fevers and worsening clinical status 7/29, RV severely down on beside ECHO so concerns for acute PE. Pt received alteplase on 7/30 for suspected saddle PE.    Heparin level sub-therapeutic at 0.18 units/mL.  No issue with heparin infusion nor bleeding per discussion with RN.  Goal of Therapy:  Heparin level 0.3-0.7 units/mL Monitor platelets by anticoagulation protocol: Yes   Plan:  Repeat heparin 1500 units IV bolus, then Increase heparin infusion to 2050 units/hr Check 8 hr  heparin level  Monitor daily heparin level and CBC  Liliana Brentlinger D. Mina Marble, PharmD, BCPS, [place] 05/30/2022, 10:43 PM

## 2022-05-31 DIAGNOSIS — A850 Enteroviral encephalitis: Secondary | ICD-10-CM | POA: Diagnosis not present

## 2022-05-31 DIAGNOSIS — J9601 Acute respiratory failure with hypoxia: Secondary | ICD-10-CM | POA: Diagnosis not present

## 2022-05-31 DIAGNOSIS — G40901 Epilepsy, unspecified, not intractable, with status epilepticus: Secondary | ICD-10-CM

## 2022-05-31 DIAGNOSIS — Z515 Encounter for palliative care: Secondary | ICD-10-CM | POA: Diagnosis not present

## 2022-05-31 DIAGNOSIS — G4089 Other seizures: Secondary | ICD-10-CM

## 2022-05-31 DIAGNOSIS — R270 Ataxia, unspecified: Secondary | ICD-10-CM | POA: Diagnosis not present

## 2022-05-31 DIAGNOSIS — Z66 Do not resuscitate: Secondary | ICD-10-CM | POA: Diagnosis not present

## 2022-05-31 DIAGNOSIS — R4182 Altered mental status, unspecified: Secondary | ICD-10-CM | POA: Diagnosis not present

## 2022-05-31 DIAGNOSIS — B9719 Other enterovirus as the cause of diseases classified elsewhere: Secondary | ICD-10-CM | POA: Diagnosis not present

## 2022-05-31 DIAGNOSIS — G039 Meningitis, unspecified: Secondary | ICD-10-CM | POA: Diagnosis not present

## 2022-05-31 DIAGNOSIS — G9341 Metabolic encephalopathy: Secondary | ICD-10-CM | POA: Diagnosis not present

## 2022-05-31 DIAGNOSIS — J189 Pneumonia, unspecified organism: Secondary | ICD-10-CM

## 2022-05-31 DIAGNOSIS — R6520 Severe sepsis without septic shock: Secondary | ICD-10-CM | POA: Diagnosis not present

## 2022-05-31 DIAGNOSIS — C8300 Small cell B-cell lymphoma, unspecified site: Secondary | ICD-10-CM | POA: Diagnosis not present

## 2022-05-31 DIAGNOSIS — A4189 Other specified sepsis: Secondary | ICD-10-CM | POA: Diagnosis not present

## 2022-05-31 DIAGNOSIS — A879 Viral meningitis, unspecified: Secondary | ICD-10-CM | POA: Diagnosis not present

## 2022-05-31 LAB — RENAL FUNCTION PANEL
Albumin: 2.5 g/dL — ABNORMAL LOW (ref 3.5–5.0)
Albumin: 2.6 g/dL — ABNORMAL LOW (ref 3.5–5.0)
Anion gap: 10 (ref 5–15)
Anion gap: 9 (ref 5–15)
BUN: 46 mg/dL — ABNORMAL HIGH (ref 8–23)
BUN: 47 mg/dL — ABNORMAL HIGH (ref 8–23)
CO2: 26 mmol/L (ref 22–32)
CO2: 26 mmol/L (ref 22–32)
Calcium: 7.6 mg/dL — ABNORMAL LOW (ref 8.9–10.3)
Calcium: 7.7 mg/dL — ABNORMAL LOW (ref 8.9–10.3)
Chloride: 102 mmol/L (ref 98–111)
Chloride: 103 mmol/L (ref 98–111)
Creatinine, Ser: 1.53 mg/dL — ABNORMAL HIGH (ref 0.44–1.00)
Creatinine, Ser: 1.34 mg/dL — ABNORMAL HIGH (ref 0.44–1.00)
GFR, Estimated: 37 mL/min — ABNORMAL LOW (ref >60)
GFR, Estimated: 43 mL/min — ABNORMAL LOW (ref >60)
Glucose, Bld: 320 mg/dL — ABNORMAL HIGH (ref 70–99)
Glucose, Bld: 378 mg/dL — ABNORMAL HIGH (ref 70–99)
Phosphorus: 3.7 mg/dL (ref 2.5–4.6)
Phosphorus: 4.3 mg/dL (ref 2.5–4.6)
Potassium: 4.2 mmol/L (ref 3.5–5.1)
Potassium: 4.3 mmol/L (ref 3.5–5.1)
Sodium: 138 mmol/L (ref 135–145)
Sodium: 138 mmol/L (ref 135–145)

## 2022-05-31 LAB — GLUCOSE, CAPILLARY
Glucose-Capillary: 287 mg/dL — ABNORMAL HIGH (ref 70–99)
Glucose-Capillary: 290 mg/dL — ABNORMAL HIGH (ref 70–99)
Glucose-Capillary: 311 mg/dL — ABNORMAL HIGH (ref 70–99)
Glucose-Capillary: 316 mg/dL — ABNORMAL HIGH (ref 70–99)
Glucose-Capillary: 346 mg/dL — ABNORMAL HIGH (ref 70–99)
Glucose-Capillary: 367 mg/dL — ABNORMAL HIGH (ref 70–99)

## 2022-05-31 LAB — CBC
HCT: 25.7 % — ABNORMAL LOW (ref 36.0–46.0)
Hemoglobin: 8.1 g/dL — ABNORMAL LOW (ref 12.0–15.0)
MCH: 24.6 pg — ABNORMAL LOW (ref 26.0–34.0)
MCHC: 31.5 g/dL (ref 30.0–36.0)
MCV: 78.1 fL — ABNORMAL LOW (ref 80.0–100.0)
Platelets: 167 10*3/uL (ref 150–400)
RBC: 3.29 MIL/uL — ABNORMAL LOW (ref 3.87–5.11)
RDW: 17.2 % — ABNORMAL HIGH (ref 11.5–15.5)
WBC: 12.9 10*3/uL — ABNORMAL HIGH (ref 4.0–10.5)
nRBC: 0.9 % — ABNORMAL HIGH (ref 0.0–0.2)

## 2022-05-31 LAB — COMPREHENSIVE METABOLIC PANEL
ALT: 3408 U/L — ABNORMAL HIGH (ref 0–44)
AST: 2519 U/L — ABNORMAL HIGH (ref 15–41)
Albumin: 2.5 g/dL — ABNORMAL LOW (ref 3.5–5.0)
Alkaline Phosphatase: 146 U/L — ABNORMAL HIGH (ref 38–126)
Anion gap: 9 (ref 5–15)
BUN: 46 mg/dL — ABNORMAL HIGH (ref 8–23)
CO2: 26 mmol/L (ref 22–32)
Calcium: 7.5 mg/dL — ABNORMAL LOW (ref 8.9–10.3)
Chloride: 103 mmol/L (ref 98–111)
Creatinine, Ser: 1.58 mg/dL — ABNORMAL HIGH (ref 0.44–1.00)
GFR, Estimated: 36 mL/min — ABNORMAL LOW (ref >60)
Glucose, Bld: 346 mg/dL — ABNORMAL HIGH (ref 70–99)
Potassium: 4.3 mmol/L (ref 3.5–5.1)
Sodium: 138 mmol/L (ref 135–145)
Total Bilirubin: 1.4 mg/dL — ABNORMAL HIGH (ref 0.3–1.2)
Total Protein: 4.7 g/dL — ABNORMAL LOW (ref 6.5–8.1)

## 2022-05-31 LAB — MISC LABCORP TEST (SEND OUT): Labcorp test code: 163275

## 2022-05-31 LAB — IGG, IGA, IGM
IgA: 44 mg/dL — ABNORMAL LOW (ref 87–352)
IgG (Immunoglobin G), Serum: 170 mg/dL — ABNORMAL LOW (ref 586–1602)
IgM (Immunoglobulin M), Srm: 53 mg/dL (ref 26–217)

## 2022-05-31 LAB — LEGIONELLA PNEUMOPHILA SEROGP 1 UR AG: L. pneumophila Serogp 1 Ur Ag: NEGATIVE

## 2022-05-31 LAB — HEPATITIS C ANTIBODY: HCV Ab: NONREACTIVE

## 2022-05-31 LAB — MAGNESIUM: Magnesium: 2.6 mg/dL — ABNORMAL HIGH (ref 1.7–2.4)

## 2022-05-31 LAB — HEPATITIS B CORE ANTIBODY, IGM: Hep B C IgM: NONREACTIVE

## 2022-05-31 LAB — HEPARIN LEVEL (UNFRACTIONATED)
Heparin Unfractionated: 0.32 IU/mL (ref 0.30–0.70)
Heparin Unfractionated: 0.46 IU/mL (ref 0.30–0.70)

## 2022-05-31 LAB — HEPATITIS B CORE ANTIBODY, TOTAL: Hep B Core Total Ab: NONREACTIVE

## 2022-05-31 MED ORDER — PROPOFOL 1000 MG/100ML IV EMUL
60.0000 ug/kg/min | INTRAVENOUS | Status: DC
  Administered 2022-05-31: 50 ug/kg/min via INTRAVENOUS
  Administered 2022-05-31: 10 ug/kg/min via INTRAVENOUS
  Administered 2022-05-31: 80 ug/kg/min via INTRAVENOUS
  Administered 2022-05-31: 70 ug/kg/min via INTRAVENOUS
  Administered 2022-05-31: 80 ug/kg/min via INTRAVENOUS
  Administered 2022-06-01: 60 ug/kg/min via INTRAVENOUS
  Administered 2022-06-01: 80 ug/kg/min via INTRAVENOUS
  Administered 2022-06-01: 60 ug/kg/min via INTRAVENOUS
  Administered 2022-06-01 (×4): 80 ug/kg/min via INTRAVENOUS
  Administered 2022-06-01 – 2022-06-02 (×3): 60 ug/kg/min via INTRAVENOUS
  Administered 2022-06-02: 50 ug/kg/min via INTRAVENOUS
  Administered 2022-06-02 (×2): 60 ug/kg/min via INTRAVENOUS
  Filled 2022-05-31 (×6): qty 100
  Filled 2022-05-31: qty 200
  Filled 2022-05-31: qty 100
  Filled 2022-05-31: qty 200
  Filled 2022-05-31 (×8): qty 100

## 2022-05-31 MED ORDER — MIDAZOLAM HCL 2 MG/2ML IJ SOLN
4.0000 mg | Freq: Once | INTRAMUSCULAR | Status: AC
  Administered 2022-05-31: 4 mg via INTRAVENOUS
  Filled 2022-05-31: qty 4

## 2022-05-31 MED ORDER — LEVETIRACETAM IN NACL 500 MG/100ML IV SOLN
500.0000 mg | Freq: Once | INTRAVENOUS | Status: AC
  Administered 2022-05-31: 500 mg via INTRAVENOUS
  Filled 2022-05-31: qty 100

## 2022-05-31 MED ORDER — INSULIN ASPART 100 UNIT/ML IJ SOLN
4.0000 [IU] | INTRAMUSCULAR | Status: DC
  Administered 2022-05-31 (×2): 4 [IU] via SUBCUTANEOUS

## 2022-05-31 MED ORDER — DOCUSATE SODIUM 50 MG/5ML PO LIQD
100.0000 mg | Freq: Two times a day (BID) | ORAL | Status: DC
  Administered 2022-05-31 – 2022-06-21 (×20): 100 mg
  Filled 2022-05-31 (×21): qty 10

## 2022-05-31 MED ORDER — INSULIN DETEMIR 100 UNIT/ML ~~LOC~~ SOLN
35.0000 [IU] | Freq: Two times a day (BID) | SUBCUTANEOUS | Status: DC
  Administered 2022-05-31: 35 [IU] via SUBCUTANEOUS
  Filled 2022-05-31 (×2): qty 0.35

## 2022-05-31 MED ORDER — LORAZEPAM 2 MG/ML IJ SOLN
INTRAMUSCULAR | Status: AC
  Administered 2022-05-31: 2 mg via INTRAVENOUS
  Filled 2022-05-31: qty 1

## 2022-05-31 MED ORDER — NOREPINEPHRINE 4 MG/250ML-% IV SOLN: 0.0000 ug/min | INTRAVENOUS | Status: DC

## 2022-05-31 MED ORDER — INSULIN ASPART 100 UNIT/ML IJ SOLN
0.0000 [IU] | INTRAMUSCULAR | Status: DC
  Administered 2022-06-01: 20 [IU] via SUBCUTANEOUS

## 2022-05-31 MED ORDER — LACOSAMIDE 200 MG/20ML IV SOLN
200.0000 mg | Freq: Two times a day (BID) | INTRAVENOUS | Status: DC
Start: 2022-05-31 — End: 2022-06-25
  Administered 2022-05-31 – 2022-06-24 (×49): 200 mg via INTRAVENOUS
  Filled 2022-05-31 (×54): qty 20

## 2022-05-31 MED ORDER — INSULIN ASPART 100 UNIT/ML IJ SOLN
6.0000 [IU] | INTRAMUSCULAR | Status: DC
  Administered 2022-05-31 (×2): 6 [IU] via SUBCUTANEOUS

## 2022-05-31 MED ORDER — METRONIDAZOLE 500 MG PO TABS
500.0000 mg | ORAL_TABLET | Freq: Two times a day (BID) | ORAL | Status: DC
  Administered 2022-05-31 – 2022-06-02 (×4): 500 mg
  Filled 2022-05-31 (×4): qty 1

## 2022-05-31 MED ORDER — INSULIN DETEMIR 100 UNIT/ML ~~LOC~~ SOLN
30.0000 [IU] | Freq: Two times a day (BID) | SUBCUTANEOUS | Status: DC
  Administered 2022-05-31: 30 [IU] via SUBCUTANEOUS
  Filled 2022-05-31 (×2): qty 0.3

## 2022-05-31 MED ORDER — NOREPINEPHRINE 16 MG/250ML-% IV SOLN
0.0000 ug/min | INTRAVENOUS | Status: DC
  Administered 2022-05-31: 2 ug/min via INTRAVENOUS
  Administered 2022-06-01: 2.75 ug/min via INTRAVENOUS
  Administered 2022-06-05: 2 ug/min via INTRAVENOUS
  Administered 2022-06-07 – 2022-06-08 (×2): 1 ug/min via INTRAVENOUS
  Filled 2022-05-31 (×4): qty 250

## 2022-05-31 MED ORDER — POLYETHYLENE GLYCOL 3350 17 G PO PACK
17.0000 g | PACK | Freq: Every day | ORAL | Status: DC
  Administered 2022-05-31 – 2022-06-21 (×7): 17 g
  Filled 2022-05-31 (×7): qty 1

## 2022-05-31 MED ORDER — MIDAZOLAM HCL 2 MG/2ML IJ SOLN
2.0000 mg | Freq: Once | INTRAMUSCULAR | Status: AC
  Administered 2022-05-31: 2 mg via INTRAVENOUS

## 2022-05-31 MED ORDER — INSULIN ASPART 100 UNIT/ML IJ SOLN
6.0000 [IU] | INTRAMUSCULAR | Status: DC
  Administered 2022-05-31: 6 [IU] via SUBCUTANEOUS

## 2022-05-31 MED ORDER — MIDAZOLAM-SODIUM CHLORIDE 100-0.9 MG/100ML-% IV SOLN
40.0000 mg/h | INTRAVENOUS | Status: AC
  Administered 2022-05-31: 10 mg/h via INTRAVENOUS
  Administered 2022-06-01 (×4): 40 mg/h via INTRAVENOUS
  Administered 2022-06-01: 10 mg/h via INTRAVENOUS
  Administered 2022-06-01 – 2022-06-02 (×4): 40 mg/h via INTRAVENOUS
  Filled 2022-05-31 (×10): qty 100

## 2022-05-31 MED ORDER — LEVETIRACETAM IN NACL 1000 MG/100ML IV SOLN
1000.0000 mg | Freq: Two times a day (BID) | INTRAVENOUS | Status: DC
  Administered 2022-05-31 – 2022-06-15 (×30): 1000 mg via INTRAVENOUS
  Filled 2022-05-31 (×30): qty 100

## 2022-05-31 MED ORDER — SODIUM CHLORIDE 0.9 % IV SOLN
100.0000 mg | Freq: Two times a day (BID) | INTRAVENOUS | Status: DC
  Administered 2022-05-31: 100 mg via INTRAVENOUS
  Filled 2022-05-31 (×2): qty 10

## 2022-05-31 MED ORDER — MIDAZOLAM BOLUS VIA INFUSION: 0.0000 mg | INTRAVENOUS | Status: DC | PRN

## 2022-05-31 MED ORDER — SODIUM CHLORIDE 0.9 % IV SOLN
200.0000 mg | Freq: Once | INTRAVENOUS | Status: AC
  Administered 2022-05-31: 200 mg via INTRAVENOUS
  Filled 2022-05-31: qty 20

## 2022-05-31 MED ORDER — SODIUM CHLORIDE 0.9 % IV SOLN: INTRAVENOUS | Status: DC | PRN

## 2022-05-31 MED ORDER — LORAZEPAM 2 MG/ML IJ SOLN: 2.0000 mg | Freq: Once | INTRAMUSCULAR | Status: AC

## 2022-05-31 MED ORDER — SODIUM CHLORIDE 0.9 % IV SOLN
2.0000 g | INTRAVENOUS | Status: DC
  Administered 2022-05-31 – 2022-06-01 (×2): 2 g via INTRAVENOUS
  Filled 2022-05-31 (×2): qty 20

## 2022-05-31 NOTE — Progress Notes (Signed)
eLink Physician-Brief Progress Note Patient Name: Valerie Cline DOB: 01-07-1956 MRN: [id number]   Date of Service  05/31/2022  HPI/Events of Note  Patient with sub-optimal glycemic control on current regimen. She is receiving steroids.  eICU Interventions  SSI coverage switched to resistant scale and Levemir increased to 35 units BID.        Valerie Cline 05/31/2022, 9:28 PM

## 2022-05-31 NOTE — Progress Notes (Signed)
LTM maint complete - no skin breakdown under:  FP1 FP2

## 2022-05-31 NOTE — Progress Notes (Addendum)
Subjective: Patient had seizures and eventually went into status epilepticus yesterday.  ROS: Unable to obtain due to poor mental status  Examination  Vital signs in last 24 hours: Temp:  [97.3 F (36.3 C)-99.7 F (37.6 C)] 97.3 F (36.3 C) (08/01 1000) Pulse Rate:  [69-100] 69 (08/01 1104) Resp:  [28-29] 28 (08/01 1104) BP: (83-169)/(23-67) 133/53 (08/01 1104) SpO2:  [90 %-99 %] 94 % (08/01 1104) Arterial Line BP: (110-185)/(43-67) 151/55 (08/01 1000) FiO2 (%):  [40 %-50 %] 40 % (08/01 1104) Weight:  [91.7 kg] 91.7 kg (08/01 0309)  General: lying in bed, NAD Neuro: On propofol, comatose, pupils about 3 mm not reacting to light, corneal reflex absent, gag reflex absent, does not withdraw to noxious stimuli in all extremities  Basic Metabolic Panel: Recent Labs  Lab 05/27/22 1951 05/28/22 0324 05/29/22 0229 05/29/22 0546 05/29/22 1801 05/30/22 0455 05/30/22 1600 05/31/22 0417 05/31/22 0740  NA 146* 143 139   < > 146* 143 140 138 138  K 3.6 4.1 6.8*   < > 4.7 4.3 4.5 4.2 4.3  CL 106 105 100   < > 104 102 100 102 103  CO2 29 32 30   < > '26 27 26 26 26  '$ GLUCOSE 258* 197* 484*   < > 257* 166* 220* 320* 346*  BUN 29* 35* 56*   < > 70* 48* 44* 46* 46*  CREATININE 0.91 1.01* 2.22*   < > 2.79* 2.03* 1.77* 1.53* 1.58*  CALCIUM 8.2* 7.8* 7.7*   < > 7.1* 7.6* 7.6* 7.6* 7.5*  MG 1.9 2.0 2.4  --   --  2.3  --  2.6*  --   PHOS  --  3.3 8.5*  --  6.9* 5.1* 4.2 3.7  --    < > = values in this interval not displayed.    CBC: Recent Labs  Lab 05/28/22 0324 05/29/22 0229 05/29/22 1226 05/30/22 0455 05/31/22 0417  WBC 8.1 20.5* 20.2* 9.1 12.9*  HGB 8.9* 9.4* 10.0* 8.4* 8.1*  HCT 28.4* 32.8* 34.7* 27.2* 25.7*  MCV 80.5 87.5 83.8 79.3* 78.1*  PLT 271 403* 260 148* 167     Coagulation Studies: Recent Labs    05/29/22 1002  LABPROT 23.3*  INR 2.1*    Imaging MRI brain with and without contrast 05/25/2022:  1. Abnormal CSF signal, primarily posteriorly, which is  nonspecific  and may represent infection/meningitis and/or hemorrhage. 2. Enhancing extra-axial lesion along the left frontoparietal convexity, most likely a meningioma, without evidence of underlying mass effect. 3. No acute or subacute infarct.  CT head without contrast 05/29/2022: 1. Sulci are poorly seen over the posterior parietal lobes and occipital lobes. This is consistent with abnormal CSF signal and suspected meningitis. 2. No discrete extra-axial fluid collections. 3. Stable atrophy and white matter disease. 4. New right greater than left mastoid effusions.  ASSESSMENT AND PLAN: 66 year old female with suspected viral meningo-encephalitis (had hand-foot-and-mouth disease prior to presentation) now with status epilepticus.  Focal convulsive status epilepticus Acute encephalopathy, infectious Viral meningo-encephalitis Left meningioma -Patient went into status epilepticus overnight.  Likely due to viral encephalitis, meningioma (meningiomas do not commonly cause status epilepticus but the location of this meningioma is concerning).    Recommendations -Increase Keppra to 1 g twice daily -Continue Vimpat 100 mg twice daily for now.  Will consider increasing to 200 mg twice daily if needed -Start propofol for seizure suppression.  Discussed with ICU physician [place] -If seizures are difficult to control, may need  to consider changing meropenem -Discussed with daughter at bedside that if seizures are not controlled, will plan to add Versed.  Also discussed other options including ketamine and eventually pentobarbital.  Daughter understands and family are in agreement to proceed with tracheostomy in case seizures do not resolve and patient needs pentobarbital. -Continue seizure precautions  ADDENDUM -Had a 4 minute seizure at 1443.  Will increase Propofol to 70.  Discussed with ICU team.  CRITICAL CARE Performed by: Lora Havens   Total critical care time: 50  minutes  Critical care time was exclusive of separately billable procedures and treating other patients.  Critical care was necessary to treat or prevent imminent or life-threatening deterioration.  Critical care was time spent personally by me on the following activities: development of treatment plan with patient and/or surrogate as well as nursing, discussions with consultants, evaluation of patient's response to treatment, examination of patient, obtaining history from patient or surrogate, ordering and performing treatments and interventions, ordering and review of laboratory studies, ordering and review of radiographic studies, pulse oximetry and re-evaluation of patient's condition.  Zeb Comfort Epilepsy [facility] For questions after 5pm please refer to AMION to reach the Neurologist on call

## 2022-05-31 NOTE — Inpatient Diabetes Management (Signed)
Inpatient Diabetes Program Recommendations  AACE/ADA: New Consensus Statement on Inpatient Glycemic Control (2015)  Target Ranges:  Prepandial:   less than 140 mg/dL      Peak postprandial:   less than 180 mg/dL (1-2 hours)      Critically ill patients:  140 - 180 mg/dL    Latest Reference Range & Units 05/30/22 07:40 05/30/22 08:05 05/30/22 09:43 05/30/22 11:52 05/30/22 13:01 05/30/22 14:15 05/30/22 15:49 05/30/22 19:37  Glucose-Capillary 70 - 99 mg/dL 163 (H)  IV Insulin Drip Infusing 153 (H) 151 (H) 120 (H) 118 (H)  IV Insulin Drip Stopped 148 (H)  2 units Novolog  20 units Levemir 193 (H)  3 units Novolog  247 (H)  5 units Novolog  20 units Levemir '@2152'$   (H): Data is abnormally high  Latest Reference Range & Units 05/30/22 23:40 05/31/22 04:08  Glucose-Capillary 70 - 99 mg/dL 287 (H)  8 units Novolog  311 (H)  11 units Novolog   (H): Data is abnormally high     Home DM Meds: Concentrated U500 Insulin 80 units Breakfast/ 50 units Lunch/ 90 units Dinner        Trulicity 3 mg Qweek   Current Orders: Levemir 20 units BID      Novolog Moderate Correction Scale/ SSI (0-15 units) Q4 hours     MD- Note patient was transitioned to SQ Insulin from the IV Insulin Drip yesterday around 2pm.  Looks like Insulin Drip was stopped prior to Basal Insulin being given?  Of note, pt getting Solumedrol 125 mg BID which is making glucose control more difficult.  CBGs 287-311 since Midnight  Tube Feeds running 60 cc/hr/ Getting CRRT  --Given pt condition, may consider Restating the ICU Glycemic Control Protocol Phase 2 with IV Insulin  --If decision made to adjust SQ Insulin instead, could try:  Increase Levemir to 30 units BID (0.65 units/kg)  Start Novolog Tube Feed Coverage: Novolog 6 units Q4 hours HOLD of tube feeds HELD for any reason     --Will follow patient during hospitalization--  Wyn Quaker RN, MSN, [place] Diabetes Coordinator Inpatient  Glycemic Control Team Team Pager: [phone] (8a-5p)

## 2022-05-31 NOTE — Progress Notes (Signed)
[place] notified of BG 200-300s since insulin gtt d/c'd yesterday. Pt on steroids. [facility] RN Ivin Booty to discuss with Dr. Lucile Shutters. Awaiting further orders.

## 2022-05-31 NOTE — Progress Notes (Signed)
Sounds like things are going all that well.  She is having more seizures.  She is being given Ativan.  She is on Vimpat.  She is currently on dialysis.  It does not look like there is a lot of activity from what I can tell in for the nurse tells me.  Her white cell count is 12.9.  Hemoglobin 8.1.  Platelet count is 167,000.  Her potassium is 4.2.  BUN is 46 creatinine 1.53.  Glucose is 320.  He will be interesting to see what her LFTs are.  There incredibly elevated yesterday.  I would think this all will go along with her having hypotension.  I think she is back on some pressor support.  It seems as if the working diagnosis is still the meningoencephalitis.  I would have to think this is some kind of viral process.  I know that she grew out on a respiratory panel enterovirus.  I just hate that she is going through this.  I know that she is doing her best.  I know she is getting incredible care from all the staff down in the CCU.  It is hard to say how much longer she will be able to survive this.  I know that from a oncologic point of view, she is in remission from the [place].  This really should never be an issue for her.  As such, I would not think that this should be consideration with respect any decision that is made for her continued care and support.  Lattie Haw, MD  Oswaldo Milian 41:10

## 2022-05-31 NOTE — Progress Notes (Signed)
Daughter Sharee Pimple notified of versed gtt starting d/t seizure activity per her request. She has no further questions at this time.

## 2022-05-31 NOTE — Progress Notes (Signed)
LTM maint complete - no skin breakdown under: Fp1 Fp2 Fz Serviced A2 P3 [facility] monitored, Event button test confirmed by [facility].

## 2022-05-31 NOTE — Procedures (Addendum)
Patient Name: Valerie Cline  MRN: [id number]  Epilepsy Attending: Lora Havens  Referring Physician/Provider: Greta Doom, MD   Duration: 05/30/2022 1014 to 05/31/2022 1014   Patient history: 66yo F with ams. EEG to evaluate for seizure   Level of alertness: lethargic, asleep   AEDs during EEG study: propofol, Keppra, Vimpat   Technical aspects: This EEG study was done with scalp electrodes positioned according to the 10-20 International system of electrode placement. Electrical activity was acquired at a sampling rate of '500Hz'$  and reviewed with a high frequency filter of '70Hz'$  and a low frequency filter of '1Hz'$ . EEG data were recorded continuously and digitally stored.    Description: At the beginning of the study, EEG showed seizures arising from left posterior quadrant. During the seizure, initially patient had no clinical signs.  She then had sudden brief eye-opening.  Concomitant EEG showed spike and wave in left posterior quadrant which then involved all of left hemisphere and right posterior quadrant. Nine seizures were noted between 1015 to 1500 on 05/30/2022.  After 1500, frequency of seizures gradually worsened.  IV Vimpat was added.  However, EEG continued to worsen and showed status epilepticus.  At times patient was noted to have eye-opening and full body jerking but during most of the EEG, patient did not have any clinical signs.  IV propofol was added without resolution of status epilepticus.  ABNORMALITY -Convulsive status epilepticus, left posterior quadrant   IMPRESSION: This study showed status epilepticus arising from left posterior quadrant.  During most of the EEG, no clinical signs were seen.  However at times patient was noted to have eye-opening and whole body jerking.  Genea Rheaume [name]

## 2022-05-31 NOTE — Progress Notes (Signed)
ABG results: not crossing over in Epic  [id number]

## 2022-05-31 NOTE — Progress Notes (Signed)
ABG Results: pH 7.39 PCO2: 45.7 PO2: 122 HCO3: 27.9  [facility] Notified

## 2022-05-31 NOTE — Progress Notes (Signed)
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   Discharge summary reviewed: She initially presented to OSH on 05/20/22 for left upper extremity tremors. She reported recent hand foot any mouth disease contracted from her grandchild that left her weak and tired prior to this admission. Neurology work-up including MRI, EEG negative. MRI/MRA neg for acute infarct, 13 mm focus in left frontal lobe representing artifact vs small calcified meningioma, chronic small vessel ischemic changes, cerebral atrophy, high-grade stenosis in bilateral ICA, focal stenosis in left ACA and intracranial right vertebral artery. MRI of C-spine showing C4-5 cord compression from advanced disc degeneration with posterior endplate ridging.  Showing left foraminal impingement at the same level.  Also showing C5-6 and C6-7 ADCF with solid arthrodesis.  Neurologist at [facility] had recommended neurosurgical evaluation.  She developed fevers and transferred to ICU concerning for meningitis and started on Vanc, rocephin, ampicillin and acyclovir. LP was performed on 7/24 with WBC 55 RBC 2. Pending herpes, enterovirus, ehrlichia. CT A/P showed bilateral ground glass and lung opacities concerning for multifocal pneumonia.   PCCM consulted for decreased responsiveness and hypoxemia on NRB. On exam obtunded and only able to say name. O2 sats in the 70s on NRB. Transferred to the ICU for emergent intubation   Pertinent  Medical History  As above  Significant Hospital Events: Including procedures,  antibiotic start and stop dates in addition to other pertinent events   7/24 Admitted to [facility] from [place]. Transferred to PCCM when found in respiratory failure 7/25 intubated, bronched, art line placed, PICC placed.  Neuro and ID consulted. Started decadron  7/26 decadron dc/d 7/27 felt most consistent with viral meningoencephalitis  7/29-7/30 sudden onset multiorgan failure  Interim History / Subjective:  LFTs started improving Still remains on low-dose Levophed, vasopressin was turned off  Started with seizure from left hemisphere  Objective   Blood pressure (!) 133/53, pulse 69, temperature (!) 97.3 F (36.3 C), resp. rate (!) 28, height 5' 5.98" (1.676 m), weight 91.7 kg, SpO2 94 %. CVP:  [9 mmHg-19 mmHg] 11 mmHg  Vent Mode: PRVC FiO2 (%):  [40 %-50 %] 40 % Set Rate:  [28 bmp] 28 bmp Vt Set:  [480 mL] 480 mL PEEP:  [8 cmH20-10 cmH20] 8 cmH20 Plateau Pressure:  [21 cmH20-28 cmH20] 21 cmH20   Intake/Output Summary (Last 24 hours) at 05/31/2022 1124 Last data filed at 05/31/2022 1100 Gross per 24 hour  Intake 3617.69 ml  Output 3989 ml  Net -371.31 ml   Filed Weights   05/28/22 0500 05/30/22 0500 05/31/22 0309  Weight: 88.7 kg 94.3 kg 91.7 kg   Physical Exam:   Physical exam: General: Crtitically ill-appearing female, orally intubated HEENT: [place]/AT, eyes anicteric.  ETT and OGT in place Neuro: Sedated, not following commands.  Eyes are closed.  Pupils 3 mm bilateral none reactive to light, weak cough and gag Chest: Coarse breath sounds, no wheezes or rhonchi Heart: Regular rate and rhythm, no murmurs or gallops Abdomen: Soft, nontender, nondistended, bowel sounds present Skin: No rash   Resolved Hospital Problem list   N/A  Assessment & Plan:   Acute encephalopathy in setting of viral meningoencephalitis  Unfortunately sedation can be turned down as patient is in status epilepticus Continue propofol and titrate up Fentanyl is being titrated down  Acute  hypoxemic/hypercapnic respiratory failure 2/2 multilobar PNA  Trach aspirate is growing oral flora, follow-up culture until its speciate Appreciate infectious disease consult Vancomycin. 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  [facility] OSH neg, culture neg, arbovirus panel penidng. Critical cervical myopathy - question baseline severe DJD with meningeal swelling threatening critical stenosis cont droplet  Appreciate ID and neuro following  Continue IV steroid Arboviral panel pending in [place]  F/u enterovirus PCR from [facility] added on by [facility] 7/29 -anticipate that recovery from viral process could take weeks/months  Status epilepticus Patient was started with seizures overnight rising from left hemisphere now she is in status epilepticus Keppra dose was increased to 1 g twice daily She was loaded with Vimpat and now she is on twice daily Vimpat dosing Started back on propofol Remain on continuous EEG Appreciate neurology follow-up  UTI Positive UA at OSH Urine culture was not sent Meropenem should cover   DM2 with severe hyperglycemia  She has labile blood sugars, currently continue with insulin infusion, once stabilized we will try to switch her to long-acting  Small lymphocytic B cell non-hodgkin lymphoma On maintenance rituximab (last received April 05 2022)  Follows Dr. Marin [facility] is following Immunoglobulins levels are in progress  Sudden onset shock, acute renal failure, acute liver failure, RV dilation, large dead space, severe metabolic acidosis Suspected saddle pulmonary embolism Patient received tPA, with improvement in hemodynamics Continue IV heparin Remain on on CRRT Metabolic acidosis has improved, bicarbonate infusion was stopped Nephrology is following LFTs started improving  Closely  monitor electrolytes and bicarbonate  Best Practice (right click and "Reselect all SmartList Selections" daily)   Diet/type: Resume tube feed DVT prophylaxis: heparin gtt GI prophylaxis: PPI Lines: PICC  Foley:  Yes, and it is still needed  Code Status:  full code Last date of multidisciplinary goals of care discussion - 05/24/22. Family updated 8/1 at bedside   Total critical care time: 47 minutes  Performed by: [place] care time was exclusive of separately billable procedures and treating other patients.   Critical care was necessary to treat or prevent imminent or life-threatening deterioration.   Critical care was time spent personally by me on the following activities: development of treatment plan with patient and/or surrogate as well as nursing, discussions with consultants, evaluation of patient's response to treatment, examination of patient, obtaining history from patient or surrogate, ordering and performing treatments and interventions, ordering and review of laboratory studies, ordering and review of radiographic studies, pulse oximetry and re-evaluation of patient's condition.   Jacky Kindle, MD [facility] See Amion for pager If no response to pager, please call [phone] until 7pm After 7pm, Please call E-link [phone]

## 2022-05-31 NOTE — Progress Notes (Signed)
Brief Neuro Update:  Notified of seizures by bedside RN. cEEG does seem to demonstrate seizures. Ativan '2mg'$  IV once, will get EKG and if Pr interval normal, will load her up with Vimpat '200mg'$  IV once and start Vimpat '100mg'$  BID.  [place] Pager Number [phone]

## 2022-05-31 NOTE — Progress Notes (Signed)
ANTICOAGULATION CONSULT NOTE- follow-up  Pharmacy Consult for heparin Indication: pulmonary embolus  Allergies  Allergen Reactions   Canagliflozin Other (See Comments)    recurrent vaginitis   Glucophage [Metformin] Other (See Comments)    GI intolerance   Niaspan [Niacin] Other (See Comments)    flushing    Patient Measurements: Height: 5' 5.98" (167.6 cm) Weight: 91.7 kg (202 lb 2.6 oz) IBW/kg (Calculated) : 59.25 Heparin Dosing Weight: 94.3kg  Vital Signs: Temp: 95.5 F (35.3 C) (08/01 1509) Temp Source: Axillary (08/01 1408) BP: 152/55 (08/01 1509) Pulse Rate: 67 (08/01 1509)  Labs: Recent Labs    05/29/22 1002 05/29/22 1226 05/29/22 1801 05/30/22 0455 05/30/22 1153 05/30/22 1600 05/30/22 2159 05/31/22 0417 05/31/22 0740 05/31/22 1456  HGB  --  10.0*  --  8.4*  --   --   --  8.1*  --   --   HCT  --  34.7*  --  27.2*  --   --   --  25.7*  --   --   PLT  --  260  --  148*  --   --   --  167  --   --   APTT 40*  --   --   --   --   --   --   --   --   --   LABPROT 23.3*  --   --   --   --   --   --   --   --   --   INR 2.1*  --   --   --   --   --   --   --   --   --   HEPARINUNFRC  --   --    < > 0.12*   < >  --  0.18* 0.32  --  0.46  CREATININE  --  2.87*   < > 2.03*  --  1.77*  --  1.53* 1.58*  --    < > = values in this interval not displayed.     Estimated Creatinine Clearance: 39.4 mL/min (A) (by C-G formula based on SCr of 1.58 mg/dL (H)).   Assessment: 51 yoF admitted with meningitis from OSH. Pt with ongoing fevers and worsening clinical status 7/29, RV severely down on beside ECHO so concerns for acute PE. Pt received alteplase on 7/30 for suspected saddle PE.     8/1 AM update:   Heparin level therapeutic (0.32)  8/1 PM update: Heparin level therapeutic (0.46) no issues with infusion. No signs of bleeding. Hgb 8.1  Goal of Therapy:  Heparin level 0.3-0.7 units/mL Monitor platelets by anticoagulation protocol: Yes   Plan:  Cont heparin  2050 units/hr CBC / heparin level daily  Dilan Fullenwider BS, PharmD, BCPS Clinical Pharmacist 05/31/2022 3:57 PM  Contact: [phone] after 3 PM  "Be curious, not judgmental..." -Jamal Maes

## 2022-05-31 NOTE — Progress Notes (Signed)
[facility] Infectious Diseases Follow Up Note  Patient Identification: Patient Name: Valerie Cline MRN: [id number] [place] Date: 05/17/2022  9:11 PM Age: 65 y.o.Today's Date: 05/31/2022  Reason for Visit: Viral meningoencephalitis  Principal Problem:   Meningitis Active Problems:   Severe sepsis ([place])   Acute metabolic encephalopathy   Acute respiratory failure with hypoxia (HCC)   Multifocal pneumonia   Meningioma (HCC)   Cervical myelopathy (HCC)   UTI (urinary tract infection)  Meropenem 7/28-current Vancomycin 7/24-7/26, Vancomycin 7/30-7/31 Ceftriaxone 7/24, 7/27 Cefepime 7/25-7/26 Ampicillin 7/24-7/25 Acyclovir 7/24-7/26    Lines/Hardwares: RT IJ non tunneled HD Catheter, Left UE PICC, Left radial art line    Interval Events: Remains on low dose levophed, off vasopressin, seizures this morning    Assessment #Possible viral meningoencephalitis in the setting of recent hand-foot and mouth disease with positive enterovirus in RVP in an immunocompromised patient Per [facility] Microbiology  CSF cx 7/24 NG, gram stain negative, HSV1 and 2 DNA negative  ( CSF WBC 55, N 84%, L 5%, M 2%, mesothelial cells and other cells 10%, RBC 2) Blood cx 7/21 and 7/22 negative) RMSF IgM and IgG + Lyme total ab 7/68 negative Ehrlichia PCR and Entero virus PCR from CSF pending   # Seizures/Status Epilepticus  - on antiepileptics; cEEG - Neurology following   # Acute Respiratory Failure in the setting of encephalopathy + multolobar PNA 7/25 BAL cx AFB negative, gram stain and cx negative, fungal cx pending  7/28 tracheal aspirate cx normal resp flora.   #Small lymphocytic B cell non-Hodgkin lymphoma On maintenance rituximab, last received June, 2023.   Follows Dr. Marin Olp 7/31 IGG/IGA low, IGM WNL   #Suspected saddle pulmonary embolism in the setting of sudden onset shock/acute renal and liver failure ( Sock liver), RV dilatation  with severe metabolic acidosis Status post tPA with improvement in hemodynamics, on IV heparin On CRRT, nephrology following 7/30 Liver US s/o cirrhosis, Liver enzymes down trending   # Critical Cervical Myelopathy/ACDF C5-C7/Progressive Weakness and Tremors  On solumedrol, Neurology following   Recommendations Will switch meropenem to ceftriaxone and metronidazole due to new seizures  Fu Ehrlichia PCR/arbo virus PCR from CSF at [place], no update today  Fu Hepatitis B serology, HCV ab NR Fu Final BAL cx Monitor CMP  Following  Rest of the management as per the primary team. Thank you for the consult. Please page with pertinent questions or concerns. _____________________________________________________________________ Subjective patient seen and examined at the bedside. Spoke with daughter at bedside Fentanyl, propofol, heparin, vasopressin   Vitals BP 132/62   Pulse 68   Temp (!) 96.3 F (35.7 C)   Resp (!) 28   Ht 5' 5.98" (1.676 m)   Wt 91.7 kg   SpO2 94%   BMI 32.65 kg/m   Physical Exam Constitutional: Orotracheally intubated, on CRRT    Comments: EEG leads on head , Generalized edema  Cardiovascular:     Rate and Rhythm: Normal rate and regular rhythm.     Heart sounds:   Pulmonary:     Effort: Pulmonary effort is normal on mechanical ventilation    Comments: Coarse breath sounds  Abdominal:     Palpations: Abdomen is soft.     Tenderness: Nondistended and nontender  Musculoskeletal:        General: No swelling or tenderness in peripheral joints.   Skin:    Comments: RT IJ non tunneled HD Catheter, Left UE PICC, Left radial art line   Neurological:  General: Unresponsive   Pertinent Microbiology Results for orders placed or performed during the hospital encounter of 05/21/2022  MRSA Next Gen by PCR, Nasal     Status: None   Collection Time: 04/30/2022  9:42 PM   Specimen: Nasal Mucosa; Nasal Swab  Result Value Ref Range Status   MRSA by PCR Next  Gen NOT DETECTED NOT DETECTED Final    Comment: (NOTE) The GeneXpert MRSA Assay (FDA approved for NASAL specimens only), is one component of a comprehensive MRSA colonization surveillance program. It is not intended to diagnose MRSA infection nor to guide or monitor treatment for MRSA infections. Test performance is not FDA approved in patients less than 51 years old. Performed at [facility], [place] [address], [place], [place] [address]   Urine Culture     Status: None   Collection Time: 05/24/22  2:11 AM   Specimen: Urine, Clean Catch  Result Value Ref Range Status   Specimen Description URINE, CLEAN CATCH  Final   Special Requests NONE  Final   Culture   Final    NO GROWTH Performed at [facility], [address] [place], [place] [address]    Report Status 05/25/2022 FINAL  Final  Culture, blood (Routine X 2) w Reflex to ID Panel     Status: None   Collection Time: 05/24/22  6:16 AM   Specimen: BLOOD RIGHT HAND  Result Value Ref Range Status   Specimen Description BLOOD RIGHT HAND  Final   Special Requests   Final    BOTTLES DRAWN AEROBIC AND ANAEROBIC Blood Culture adequate volume   Culture   Final    NO GROWTH 5 DAYS Performed at [facility], [place] [address], [place], [place] [address]    Report Status 05/29/2022 FINAL  Final  Culture, blood (Routine X 2) w Reflex to ID Panel     Status: None   Collection Time: 05/24/22  6:17 AM   Specimen: BLOOD LEFT HAND  Result Value Ref Range Status   Specimen Description BLOOD LEFT HAND  Final   Special Requests   Final    BOTTLES DRAWN AEROBIC AND ANAEROBIC Blood Culture adequate volume   Culture   Final    NO GROWTH 5 DAYS Performed at [facility], [place] [address], [place], [place] [address]    Report Status 05/29/2022 FINAL  Final  Culture, Respiratory w Gram Stain     Status: None   Collection Time: 05/24/22  9:24 AM   Specimen: Bronchoalveolar Lavage; Respiratory  Result Value Ref Range Status    Specimen Description BRONCHIAL ALVEOLAR LAVAGE  Final   Special Requests Immunocompromised  Final   Gram Stain   Final    RARE WBC PRESENT, PREDOMINANTLY MONONUCLEAR NO ORGANISMS SEEN    Culture   Final    NO GROWTH 2 DAYS Performed at [facility], [address] [place], [place] [address]    Report Status 05/26/2022 FINAL  Final  Acid Fast Smear (AFB)     Status: None   Collection Time: 05/24/22  9:24 AM   Specimen: Bronchial Alveolar Lavage  Result Value Ref Range Status   AFB Specimen Processing Concentration  Final   Acid Fast Smear Negative  Final    Comment: (NOTE) Performed At: [facility] [place], [place] [id number] Rush Farmer MD XN:[id number]    Source (AFB) BRONCHIAL ALVEOLAR LAVAGE  Final    Comment: Performed at [facility], [place] [address] [place], [place] [address]  Fungus Culture With Stain     Status: None (Preliminary result)   Collection Time: 05/24/22  9:24 AM   Specimen: Bronchial Alveolar Lavage  Result Value Ref Range Status   Fungus Stain Final report  Final    Comment: (NOTE) Performed At: [facility] [place], [place] [id number] Rush Farmer MD QA:[id number]    Fungus (Mycology) Culture PENDING  Incomplete   Fungal Source BRONCHIAL ALVEOLAR LAVAGE  Final    Comment: Performed at [facility], [place] [address], [place], [place] [address]  Respiratory (~20 pathogens) panel by PCR     Status: None   Collection Time: 05/24/22  9:24 AM   Specimen: Bronchial Alveolar Lavage; Respiratory  Result Value Ref Range Status   Adenovirus NOT DETECTED NOT DETECTED Final   Coronavirus 229E NOT DETECTED NOT DETECTED Final    Comment: (NOTE) The Coronavirus on the Respiratory Panel, DOES NOT test for the novel  Coronavirus (2019 nCoV)    Coronavirus HKU1 NOT DETECTED NOT DETECTED Final   Coronavirus NL63 NOT DETECTED NOT DETECTED Final   Coronavirus OC43 NOT DETECTED NOT DETECTED Final    Metapneumovirus NOT DETECTED NOT DETECTED Final   Rhinovirus / Enterovirus NOT DETECTED NOT DETECTED Final   Influenza A NOT DETECTED NOT DETECTED Final   Influenza B NOT DETECTED NOT DETECTED Final   Parainfluenza Virus 1 NOT DETECTED NOT DETECTED Final   Parainfluenza Virus 2 NOT DETECTED NOT DETECTED Final   Parainfluenza Virus 3 NOT DETECTED NOT DETECTED Final   Parainfluenza Virus 4 NOT DETECTED NOT DETECTED Final   Respiratory Syncytial Virus NOT DETECTED NOT DETECTED Final   Bordetella pertussis NOT DETECTED NOT DETECTED Final   Bordetella Parapertussis NOT DETECTED NOT DETECTED Final   Chlamydophila pneumoniae NOT DETECTED NOT DETECTED Final   Mycoplasma pneumoniae NOT DETECTED NOT DETECTED Final    Comment: Performed at [facility], [place]. [address] [place], [place] [address]  Fungus Culture Result     Status: None   Collection Time: 05/24/22  9:24 AM  Result Value Ref Range Status   Result 1 Comment  Final    Comment: (NOTE) KOH/Calcofluor preparation:  no fungus observed. Performed At: [facility] [place], [place] [id number] Rush Farmer MD EH:[id number]   Culture, Respiratory w Gram Stain     Status: None   Collection Time: 05/27/22  8:31 AM   Specimen: Tracheal Aspirate; Respiratory  Result Value Ref Range Status   Specimen Description TRACHEAL ASPIRATE  Final   Special Requests NONE  Final   Gram Stain   Final    ABUNDANT WBC PRESENT, PREDOMINANTLY PMN FEW GRAM NEGATIVE RODS RARE GRAM POSITIVE COCCI IN PAIRS    Culture   Final    Normal respiratory flora-no Staph aureus or Pseudomonas seen Performed at [facility], [address], [place], [place] [address]    Report Status 05/29/2022 FINAL  Final  Respiratory (~20 pathogens) panel by PCR     Status: Abnormal   Collection Time: 05/28/22  1:53 PM   Specimen: Nasopharyngeal Swab; Respiratory  Result Value Ref Range Status   Adenovirus NOT DETECTED NOT DETECTED Final    Coronavirus 229E NOT DETECTED NOT DETECTED Final    Comment: (NOTE) The Coronavirus on the Respiratory Panel, DOES NOT test for the novel  Coronavirus (2019 nCoV)    Coronavirus HKU1 NOT DETECTED NOT DETECTED Final   Coronavirus  NL63 NOT DETECTED NOT DETECTED Final   Coronavirus OC43 NOT DETECTED NOT DETECTED Final   Metapneumovirus NOT DETECTED NOT DETECTED Final   Rhinovirus / Enterovirus DETECTED (A) NOT DETECTED Final   Influenza A NOT DETECTED NOT DETECTED Final   Influenza B NOT DETECTED NOT DETECTED Final   Parainfluenza Virus 1 NOT DETECTED NOT DETECTED Final   Parainfluenza Virus 2 NOT DETECTED NOT DETECTED Final   Parainfluenza Virus 3 NOT DETECTED NOT DETECTED Final   Parainfluenza Virus 4 NOT DETECTED NOT DETECTED Final   Respiratory Syncytial Virus NOT DETECTED NOT DETECTED Final   Bordetella pertussis NOT DETECTED NOT DETECTED Final   Bordetella Parapertussis NOT DETECTED NOT DETECTED Final   Chlamydophila pneumoniae NOT DETECTED NOT DETECTED Final   Mycoplasma pneumoniae NOT DETECTED NOT DETECTED Final    Comment: Performed at [facility], [place] [address] [place], [place] [address]  Culture, blood (Routine X 2) w Reflex to ID Panel     Status: None (Preliminary result)   Collection Time: 05/29/22  8:58 AM   Specimen: BLOOD RIGHT HAND  Result Value Ref Range Status   Specimen Description BLOOD RIGHT HAND  Final   Special Requests   Final    BOTTLES DRAWN AEROBIC AND ANAEROBIC Blood Culture adequate volume   Culture   Final    NO GROWTH 2 DAYS Performed at [facility], [place] [address], [place], [place] [address]    Report Status PENDING  Incomplete  Culture, blood (Routine X 2) w Reflex to ID Panel     Status: None (Preliminary result)   Collection Time: 05/29/22  8:58 AM   Specimen: BLOOD RIGHT HAND  Result Value Ref Range Status   Specimen Description BLOOD RIGHT HAND  Final   Special Requests IN PEDIATRIC BOTTLE Blood Culture adequate volume  Final    Culture   Final    NO GROWTH 2 DAYS Performed at [facility], [place] [address], [place], [place] [address]    Report Status PENDING  Incomplete   Pertinent Lab.    Latest Ref Rng & Units 05/31/2022    4:17 AM 05/30/2022    4:55 AM 05/29/2022   12:26 PM  CBC  WBC 4.0 - 10.5 K/uL 12.9  9.1  20.2   Hemoglobin 12.0 - 15.0 g/dL 8.1  8.4  10.0   Hematocrit 36.0 - 46.0 % 25.7  27.2  34.7   Platelets 150 - 400 K/uL 167  148  260       Latest Ref Rng & Units 05/31/2022    7:40 AM 05/31/2022    4:17 AM 05/30/2022    4:00 PM  CMP  Glucose 70 - 99 mg/dL 346  320  220   BUN 8 - 23 mg/dL 46  46  44   Creatinine 0.44 - 1.00 mg/dL 1.58  1.53  1.77   Sodium 135 - 145 mmol/L 138  138  140   Potassium 3.5 - 5.1 mmol/L 4.3  4.2  4.5   Chloride 98 - 111 mmol/L 103  102  100   CO2 22 - 32 mmol/L _0 Calcium 8.9 - 10.3 mg/dL 7.5  7.6  7.6   Total Protein 6.5 - 8.1 g/dL 4.7     Total Bilirubin 0.3 - 1.2 mg/dL 1.4     Alkaline Phos 38 - 126 U/L 146     AST 15 - 41 U/L 2,519  ALT 0 - 44 U/L 3,408        Pertinent Imaging today Plain films and CT images have been personally visualized and interpreted; radiology reports have been reviewed. Decision making incorporated into the Impression / Recommendations.  Overnight EEG with video  Result Date: 05/31/2022 Lora Havens, MD     05/31/2022  2:52 PM Patient Name: Valerie Cline MRN: [id number] Epilepsy Attending: Lora Havens Referring Physician/Provider: Greta Doom, MD  Duration: 05/30/2022 1014 to 05/31/2022 1014  Patient history: 66yo F with ams. EEG to evaluate for seizure  Level of alertness: lethargic, asleep  AEDs during EEG study: propofol, Keppra, Vimpat  Technical aspects: This EEG study was done with scalp electrodes positioned according to the 10-20 International system of electrode placement. Electrical activity was acquired at a sampling rate of _0  and reviewed with a high frequency filter of _1  and a low  frequency filter of _2 . EEG data were recorded continuously and digitally stored.  Description: At the beginning of the study, EEG showed seizures arising from left posterior quadrant. During the seizure, initially patient had no clinical signs.  She then had sudden brief eye-opening.  Concomitant EEG showed spike and wave in left posterior quadrant which then involved all of left hemisphere and right posterior quadrant. Nine seizures were noted between 1015 to 1500 on 05/30/2022.  After 1500, frequency of seizures gradually worsened.  IV Vimpat was added.  However, EEG continued to worsen and showed status epilepticus.  At times patient was noted to have eye-opening and full body jerking but during most of the EEG, patient did not have any clinical signs.  IV propofol was added without resolution of status epilepticus. ABNORMALITY -Convulsive status epilepticus, left posterior quadrant  IMPRESSION: This study showed status epilepticus arising from left posterior quadrant.  During most of the EEG, no clinical signs were seen.  However at times patient was noted to have eye-opening and whole body jerking. Lora Havens     I spent 52  minutes for this patient encounter including review of prior medical records, coordination of care with primary/other specialist with greater than 50% of time being face to face/counseling and discussing diagnostics/treatment plan with the patient/family.  Electronically signed by:   Rosiland Oz, MD Infectious Disease Physician [facility] for Infectious Disease Pager: [phone]

## 2022-05-31 NOTE — Progress Notes (Signed)
Valerie Cline Progress Note    66 y.o. year-old w/ hx HTN, hL, DM, CAD, carotid stenosis and hx NHL last chemoRx June 2023 presented to OSH on 7/21 for LUE tremors w/ recent hand-foot-and mouth disease. Work-up showed neg MRI of brain but MRI of C-spine showed sig cord compression at C4-5. Decompensated w/ high givers and AMS, concerning for meningitis and was moved to ICU. LP showed wbc 55 and CT showed multifocal PNA. Pt transferred to [facility] on 7/24. On 7/25 pt become poorly responsiveness and was intubated and moved to ICU. Fevers continued and pt developed hypotension/ shock on 7/29 and started on vaso and levo gtts.  Creat 0.8 on admit and cont to rise with hyperkalemia as well -> CRRT.    Assessment/ Plan:   AKI - b/l creat 0.8 on admission. Creat rising now in setting of new onset shock. Pt admitted for viral meningoencephalitis complicated by critical cervical myopathy and now resp failure w/ multifocal PNA. Pt has lymphoma receiving active chemoRx (rituximab) w/ last dose in June 2023. AKI due to shock which started in last 24 hrs abruptly. CCM suspecting acute PE, s/p TPA and now on IV heparin. K+ up to 6.8 -> CCRT 7/30 w/ all 2K fluids for now. No signs of renal recovery  Seen on CRRT On 4K fluids  500/400/1500 pre/post/qd Now back on low dose Levophed and tolerating 50-100 ml/hr.   Volume - mod edema on exam, I/O + 13.5 L since admit, wt's up 7 kg. Will gently challenge with 23m/hr as tolerated o/w if she doesn't tolerate will keep even. Shock - unclear etiology, as above, on vasopressor support H/o lymphoma - getting active chemoRx w/ last dose rituxan in June 2023 AHRF - on vent, multifocal PNA on broad spec IV abx per CCM Status epilepticus AMS - in setting of viral meningoencephalitis  Cervical myelopathy  DM2 - on insulin  Subjective:   Sister bedside; tolerating some UF now that she's back on Levophed.   Objective:   BP (!) 155/56   Pulse 78   Temp 98.4 F  (36.9 C)   Resp (!) 28   Ht 5' 5.98" (1.676 m)   Wt 91.7 kg   SpO2 93%   BMI 32.65 kg/m   Intake/Output Summary (Last 24 hours) at 05/31/2022 0956 Last data filed at 05/31/2022 0900 Gross per 24 hour  Intake 3266.73 ml  Output 3593 ml  Net -326.27 ml   Weight change: -2.6 kg  Physical Exam: Gen on vent, sedated No rash, cyanosis or gangrene Sclera anicteric, throat w/ ETT No jvd or bruits Chest clear anterior/ latera RRR no RG Abd soft ntnd no mass or ascites +bs GU foley draining clear light amber urine Ext 1+ bilat UE > LE edema, no wounds or ulcers Neuro is on vent, sedated  Imaging: CT HEAD WO CONTRAST (5MM)  Result Date: 05/29/2022 CLINICAL DATA:  Mental status changes. Persistent or worsening. Suspected viral meningitis. EXAM: CT HEAD WITHOUT CONTRAST TECHNIQUE: Contiguous axial images were obtained from the base of the skull through the vertex without intravenous contrast. RADIATION DOSE REDUCTION: This exam was performed according to the departmental dose-optimization program which includes automated exposure control, adjustment of the mA and/or kV according to patient size and/or use of iterative reconstruction technique. COMPARISON:  CT head without contrast 05/20/2022. MR head without and with contrast 05/25/2022 FINDINGS: Brain: Sulci are poorly seen over the posterior parietal lobes and occipital lobes. Mild atrophy and white matter changes are present.  Ventricles are stable in size. No discrete extra-axial fluid collections are present. The brainstem and cerebellum are within normal limits. Vascular: Atherosclerotic calcifications are again seen within the cavernous internal carotid arteries. No hyperdense vessel present. Skull: Calvarium is intact. No focal lytic or blastic lesions are present. No significant extracranial soft tissue lesion is present. Sinuses/Orbits: Right greater than left mastoid effusions are new. No significant middle ear effusions are present. The  paranasal sinuses and mastoid air cells are otherwise clear. The globes and orbits are within normal limits. IMPRESSION: 1. Sulci are poorly seen over the posterior parietal lobes and occipital lobes. This is consistent with abnormal CSF signal and suspected meningitis. 2. No discrete extra-axial fluid collections. 3. Stable atrophy and white matter disease. 4. New right greater than left mastoid effusions. Electronically Signed   By: San Morelle M.D.   On: 05/29/2022 17:33    Labs: BMET Recent Labs  Lab 05/27/22 0256 05/27/22 1951 05/28/22 0324 05/29/22 0229 05/29/22 0546 05/29/22 1226 05/29/22 1801 05/30/22 0455 05/30/22 1600 05/31/22 0417 05/31/22 0740  NA 140   < > 143 139 128* 144 146* 143 140 138 138  K 3.7   < > 4.1 6.8* 5.3* 5.9* 4.7 4.3 4.5 4.2 4.3  CL 106   < > 105 100 91* 102 104 102 100 102 103  CO2 27   < > 32 '30 27 26 26 27 26 26 26  '$ GLUCOSE 247*   < > 197* 484* 830* 342* 257* 166* 220* 320* 346*  BUN 24*   < > 35* 56* 52* 69* 70* 48* 44* 46* 46*  CREATININE 0.81   < > 1.01* 2.22* 2.18* 2.87* 2.79* 2.03* 1.77* 1.53* 1.58*  CALCIUM 8.0*   < > 7.8* 7.7* 6.6* 7.3* 7.1* 7.6* 7.6* 7.6* 7.5*  PHOS 2.7  --  3.3 8.5*  --   --  6.9* 5.1* 4.2 3.7  --    < > = values in this interval not displayed.   CBC Recent Labs  Lab 05/29/22 0229 05/29/22 1226 05/30/22 0455 05/31/22 0417  WBC 20.5* 20.2* 9.1 12.9*  HGB 9.4* 10.0* 8.4* 8.1*  HCT 32.8* 34.7* 27.2* 25.7*  MCV 87.5 83.8 79.3* 78.1*  PLT 403* 260 148* 167    Medications:     Chlorhexidine Gluconate Cloth  6 each Topical Q0600   Chlorhexidine Gluconate Cloth  6 each Topical Daily   docusate  100 mg Per Tube BID   docusate  100 mg Per Tube BID   insulin aspart  0-15 Units Subcutaneous Q4H   insulin aspart  4 Units Subcutaneous Q4H   insulin detemir  30 Units Subcutaneous BID   ipratropium-albuterol  3 mL Nebulization Q4H   methylPREDNISolone (SOLU-MEDROL) injection  125 mg Intravenous Q12H   multivitamin   1 tablet Per Tube QHS   mouth rinse  15 mL Mouth Rinse Q2H   pantoprazole sodium  40 mg Per Tube Daily   polyethylene glycol  17 g Per Tube Daily   sodium chloride flush  10-40 mL Intracatheter Q12H   sodium chloride flush  10-40 mL Intracatheter Q12H      Otelia Santee, MD 05/31/2022, 9:56 AM

## 2022-05-31 NOTE — Progress Notes (Deleted)
ANTICOAGULATION CONSULT NOTE  Pharmacy Consult for heparin Indication: pulmonary embolus  Allergies  Allergen Reactions   Canagliflozin Other (See Comments)    recurrent vaginitis   Glucophage [Metformin] Other (See Comments)    GI intolerance   Niaspan [Niacin] Other (See Comments)    flushing    Patient Measurements: Height: 5' 5.98" (167.6 cm) Weight: 91.7 kg (202 lb 2.6 oz) IBW/kg (Calculated) : 59.25 Heparin Dosing Weight: 94.3kg  Vital Signs: Temp: 95.5 F (35.3 C) (08/01 1509) Temp Source: Axillary (08/01 1408) BP: 152/55 (08/01 1509) Pulse Rate: 67 (08/01 1509)  Labs: Recent Labs    05/29/22 1002 05/29/22 1226 05/29/22 1801 05/30/22 0455 05/30/22 1153 05/30/22 2159 05/31/22 0417 05/31/22 0740 05/31/22 1456  HGB  --  10.0*  --  8.4*  --   --  8.1*  --   --   HCT  --  34.7*  --  27.2*  --   --  25.7*  --   --   PLT  --  260  --  148*  --   --  167  --   --   APTT 40*  --   --   --   --   --   --   --   --   LABPROT 23.3*  --   --   --   --   --   --   --   --   INR 2.1*  --   --   --   --   --   --   --   --   HEPARINUNFRC  --   --    < > 0.12*   < > 0.18* 0.32  --  0.46  CREATININE  --  2.87*   < > 2.03*   < >  --  1.53* 1.58* 1.34*   < > = values in this interval not displayed.     Estimated Creatinine Clearance: 46.5 mL/min (A) (by C-G formula based on SCr of 1.34 mg/dL (H)).   Assessment: 9 yoF admitted with meningitis from OSH. Pt with ongoing fevers and worsening clinical status 7/29, RV severely down on beside ECHO so concerns for acute PE. Pt received alteplase on 7/30 for suspected saddle PE.  Hemoglobin and platelets stable. No overt bleeding noted.   Repeat heparin level therapeutic at 0.46. Patient has had two consecutive therapeutic heparin levels.   Goal of Therapy:  Heparin level 0.3-0.7 units/mL Monitor platelets by anticoagulation protocol: Yes   Plan:  Cont heparin 2050 units/hr Daily AM heparin level   Esmeralda Arthur,  PharmD

## 2022-05-31 NOTE — Progress Notes (Signed)
Per Neuro MD, pt had a seizure noted on EEG ~1850. Per MD will give '2mg'$  Versed IV push, increase propofol gtt to 33mg/kg/min, and MD will adjust vimpat orders.

## 2022-05-31 NOTE — Progress Notes (Signed)
[place] for heparin Indication: pulmonary embolus  Allergies  Allergen Reactions   Canagliflozin Other (See Comments)    recurrent vaginitis   Glucophage [Metformin] Other (See Comments)    GI intolerance   Niaspan [Niacin] Other (See Comments)    flushing    Patient Measurements: Height: 5' 5.98" (167.6 cm) Weight: 91.7 kg (202 lb 2.6 oz) IBW/kg (Calculated) : 59.25 Heparin Dosing Weight: 94.3kg  Vital Signs: Temp: 98.2 F (36.8 C) (08/01 0500) BP: 108/54 (08/01 0500) Pulse Rate: 81 (08/01 0500)  Labs: Recent Labs    05/29/22 0229 05/29/22 0546 05/29/22 1002 05/29/22 1226 05/29/22 1801 05/30/22 0455 05/30/22 1153 05/30/22 1600 05/30/22 2159 05/31/22 0417  HGB 9.4*  --   --  10.0*  --  8.4*  --   --   --   --   HCT 32.8*  --   --  34.7*  --  27.2*  --   --   --   --   PLT 403*  --   --  260  --  148*  --   --   --   --   APTT  --   --  40*  --   --   --   --   --   --   --   LABPROT  --   --  23.3*  --   --   --   --   --   --   --   INR  --   --  2.1*  --   --   --   --   --   --   --   HEPARINUNFRC  --   --   --   --    < > 0.12* 0.13*  --  0.18* 0.32  CREATININE 2.22*   < >  --  2.87*   < > 2.03*  --  1.77*  --  1.53*   < > = values in this interval not displayed.     Estimated Creatinine Clearance: 40.7 mL/min (A) (by C-G formula based on SCr of 1.53 mg/dL (H)).   Assessment: 66 yoF admitted with meningitis from OSH. Pt with ongoing fevers and worsening clinical status 7/29, RV severely down on beside ECHO so concerns for acute PE. Pt received alteplase on 7/30 for suspected saddle PE.    Heparin level sub-therapeutic at 0.18 units/mL.  No issue with heparin infusion nor bleeding per discussion with RN.  8/1 AM update:   Heparin level therapeutic   Goal of Therapy:  Heparin level 0.3-0.7 units/mL Monitor platelets by anticoagulation protocol: Yes   Plan:  Cont heparin 2050 units/hr 1200 heparin  level  Narda Bonds, PharmD, BCPS Clinical Pharmacist Phone: [phone]

## 2022-05-31 NOTE — Progress Notes (Signed)
Pharmacy Antibiotic Note  Valerie Cline is a 66 y.o. female admitted on 05/10/2022 with suspected viral meningitis and PNA. Antimicrobials were consolidated to ceftriaxone but pt began to spike fevers so meropenem and vancomycin were started. ID consulted and recommended discontinuation of vancomycin given negative MRSA PCR and cx. WBC is 12.9 (on mthylpred) and pt is afebrile. Creatinine is trending down to 1.53 on CRRT. Pressor requirement has improved.  Plan: Meropenem 1g IV q8h Stop vancomycin   Height: 5' 5.98" (167.6 cm) Weight: 91.7 kg (202 lb 2.6 oz) IBW/kg (Calculated) : 59.25  Temp (24hrs), Avg:98.7 F (37.1 C), Min:98.1 F (36.7 C), Max:99.7 F (37.6 C)  Recent Labs  Lab 05/28/22 0324 05/29/22 0229 05/29/22 0546 05/29/22 1226 05/29/22 1801 05/30/22 0455 05/30/22 1600 05/31/22 0417  WBC 8.1 20.5*  --  20.2*  --  9.1  --  12.9*  CREATININE 1.01* 2.22* 2.18* 2.87* 2.79* 2.03* 1.77* 1.53*  LATICACIDVEN  --   --  2.5* 5.7*  --   --   --   --      Estimated Creatinine Clearance: 40.7 mL/min (A) (by C-G formula based on SCr of 1.53 mg/dL (H)).    Allergies  Allergen Reactions   Canagliflozin Other (See Comments)    recurrent vaginitis   Glucophage [Metformin] Other (See Comments)    GI intolerance   Niaspan [Niacin] Other (See Comments)    flushing   Cultures:  Resp PCR 7/25: NG Resp Cx 7/25: NG Resp PCR 7/29: Rhino/entero Bcx 7/25: NG Bcx 7/30: NGTD   Thank you for allowing pharmacy to participate in this patient's care.  Reatha Harps, PharmD PGY1 Pharmacy Resident 05/31/2022 8:11 AM Check [email or web address] for unit specific pharmacy number

## 2022-05-31 DEATH — deceased

## 2022-06-01 ENCOUNTER — Inpatient Hospital Stay (HOSPITAL_COMMUNITY): Payer: Medicare Other

## 2022-06-01 DIAGNOSIS — J9601 Acute respiratory failure with hypoxia: Secondary | ICD-10-CM | POA: Diagnosis not present

## 2022-06-01 DIAGNOSIS — B9719 Other enterovirus as the cause of diseases classified elsewhere: Secondary | ICD-10-CM | POA: Diagnosis not present

## 2022-06-01 DIAGNOSIS — G9341 Metabolic encephalopathy: Secondary | ICD-10-CM | POA: Diagnosis not present

## 2022-06-01 DIAGNOSIS — J189 Pneumonia, unspecified organism: Secondary | ICD-10-CM | POA: Diagnosis not present

## 2022-06-01 DIAGNOSIS — R4182 Altered mental status, unspecified: Secondary | ICD-10-CM | POA: Diagnosis not present

## 2022-06-01 DIAGNOSIS — G40901 Epilepsy, unspecified, not intractable, with status epilepticus: Secondary | ICD-10-CM | POA: Diagnosis not present

## 2022-06-01 DIAGNOSIS — R6520 Severe sepsis without septic shock: Secondary | ICD-10-CM | POA: Diagnosis not present

## 2022-06-01 DIAGNOSIS — G039 Meningitis, unspecified: Secondary | ICD-10-CM | POA: Diagnosis not present

## 2022-06-01 LAB — GLUCOSE, CAPILLARY
Glucose-Capillary: 406 mg/dL — ABNORMAL HIGH (ref 70–99)
Glucose-Capillary: 394 mg/dL — ABNORMAL HIGH (ref 70–99)
Glucose-Capillary: 343 mg/dL — ABNORMAL HIGH (ref 70–99)
Glucose-Capillary: 364 mg/dL — ABNORMAL HIGH (ref 70–99)
Glucose-Capillary: 325 mg/dL — ABNORMAL HIGH (ref 70–99)
Glucose-Capillary: 275 mg/dL — ABNORMAL HIGH (ref 70–99)
Glucose-Capillary: 252 mg/dL — ABNORMAL HIGH (ref 70–99)
Glucose-Capillary: 234 mg/dL — ABNORMAL HIGH (ref 70–99)
Glucose-Capillary: 204 mg/dL — ABNORMAL HIGH (ref 70–99)
Glucose-Capillary: 225 mg/dL — ABNORMAL HIGH (ref 70–99)
Glucose-Capillary: 237 mg/dL — ABNORMAL HIGH (ref 70–99)
Glucose-Capillary: 251 mg/dL — ABNORMAL HIGH (ref 70–99)
Glucose-Capillary: 187 mg/dL — ABNORMAL HIGH (ref 70–99)
Glucose-Capillary: 194 mg/dL — ABNORMAL HIGH (ref 70–99)
Glucose-Capillary: 179 mg/dL — ABNORMAL HIGH (ref 70–99)
Glucose-Capillary: 190 mg/dL — ABNORMAL HIGH (ref 70–99)
Glucose-Capillary: 174 mg/dL — ABNORMAL HIGH (ref 70–99)
Glucose-Capillary: 166 mg/dL — ABNORMAL HIGH (ref 70–99)
Glucose-Capillary: 161 mg/dL — ABNORMAL HIGH (ref 70–99)

## 2022-06-01 LAB — RENAL FUNCTION PANEL
Albumin: 2.7 g/dL — ABNORMAL LOW (ref 3.5–5.0)
Albumin: 2.3 g/dL — ABNORMAL LOW (ref 3.5–5.0)
Anion gap: 13 (ref 5–15)
Anion gap: 8 (ref 5–15)
BUN: 46 mg/dL — ABNORMAL HIGH (ref 8–23)
BUN: 43 mg/dL — ABNORMAL HIGH (ref 8–23)
CO2: 24 mmol/L (ref 22–32)
CO2: 23 mmol/L (ref 22–32)
Calcium: 7.8 mg/dL — ABNORMAL LOW (ref 8.9–10.3)
Calcium: 7.4 mg/dL — ABNORMAL LOW (ref 8.9–10.3)
Chloride: 100 mmol/L (ref 98–111)
Chloride: 103 mmol/L (ref 98–111)
Creatinine, Ser: 1.18 mg/dL — ABNORMAL HIGH (ref 0.44–1.00)
Creatinine, Ser: 1.33 mg/dL — ABNORMAL HIGH (ref 0.44–1.00)
GFR, Estimated: 51 mL/min — ABNORMAL LOW (ref >60)
GFR, Estimated: 44 mL/min — ABNORMAL LOW (ref >60)
Glucose, Bld: 427 mg/dL — ABNORMAL HIGH (ref 70–99)
Glucose, Bld: 256 mg/dL — ABNORMAL HIGH (ref 70–99)
Phosphorus: 3.7 mg/dL (ref 2.5–4.6)
Phosphorus: 3.9 mg/dL (ref 2.5–4.6)
Potassium: 4.5 mmol/L (ref 3.5–5.1)
Potassium: 4.2 mmol/L (ref 3.5–5.1)
Sodium: 137 mmol/L (ref 135–145)
Sodium: 134 mmol/L — ABNORMAL LOW (ref 135–145)

## 2022-06-01 LAB — CBC
HCT: 29.8 % — ABNORMAL LOW (ref 36.0–46.0)
Hemoglobin: 9.4 g/dL — ABNORMAL LOW (ref 12.0–15.0)
MCH: 25.1 pg — ABNORMAL LOW (ref 26.0–34.0)
MCHC: 31.5 g/dL (ref 30.0–36.0)
MCV: 79.5 fL — ABNORMAL LOW (ref 80.0–100.0)
Platelets: 294 10*3/uL (ref 150–400)
RBC: 3.75 MIL/uL — ABNORMAL LOW (ref 3.87–5.11)
RDW: 17.1 % — ABNORMAL HIGH (ref 11.5–15.5)
WBC: 29.5 10*3/uL — ABNORMAL HIGH (ref 4.0–10.5)
nRBC: 2.1 % — ABNORMAL HIGH (ref 0.0–0.2)

## 2022-06-01 LAB — TRIGLYCERIDES: Triglycerides: 535 mg/dL — ABNORMAL HIGH (ref <150)

## 2022-06-01 LAB — HEPARIN LEVEL (UNFRACTIONATED): Heparin Unfractionated: 0.5 IU/mL (ref 0.30–0.70)

## 2022-06-01 LAB — HEPATITIS B SURFACE ANTIBODY, QUANTITATIVE: Hep B S AB Quant (Post): 4.3 m[IU]/mL — ABNORMAL LOW (ref >9.9)

## 2022-06-01 LAB — MAGNESIUM: Magnesium: 2.9 mg/dL — ABNORMAL HIGH (ref 1.7–2.4)

## 2022-06-01 MED ORDER — IMMUNE GLOBULIN (HUMAN) 10 GM/100ML IV SOLN
40.0000 g | Freq: Once | INTRAVENOUS | Status: AC
  Administered 2022-06-01: 40 g via INTRAVENOUS
  Filled 2022-06-01: qty 400

## 2022-06-01 MED ORDER — INSULIN REGULAR(HUMAN) IN NACL 100-0.9 UT/100ML-% IV SOLN
INTRAVENOUS | Status: AC
  Administered 2022-06-01: 27 [IU]/h via INTRAVENOUS
  Administered 2022-06-01 (×2): 17 [IU]/h via INTRAVENOUS
  Administered 2022-06-01: 18 [IU]/h via INTRAVENOUS
  Administered 2022-06-02: 9.5 [IU]/h via INTRAVENOUS
  Administered 2022-06-02: 6 [IU]/h via INTRAVENOUS
  Administered 2022-06-02: 21 [IU]/h via INTRAVENOUS
  Administered 2022-06-03: 13 [IU]/h via INTRAVENOUS
  Administered 2022-06-03: 8.5 [IU]/h via INTRAVENOUS
  Administered 2022-06-04: 8 [IU]/h via INTRAVENOUS
  Administered 2022-06-04: 12 [IU]/h via INTRAVENOUS
  Administered 2022-06-05: 9.5 [IU]/h via INTRAVENOUS
  Administered 2022-06-05: 7 [IU]/h via INTRAVENOUS
  Filled 2022-06-01 (×3): qty 100
  Filled 2022-06-01: qty 200
  Filled 2022-06-01 (×9): qty 100

## 2022-06-01 MED ORDER — DEXTROSE 5 % IV SOLN: Freq: Once | INTRAVENOUS | Status: AC

## 2022-06-01 MED ORDER — SODIUM CHLORIDE 0.9 % IV SOLN: INTRAVENOUS | Status: DC | PRN

## 2022-06-01 MED ORDER — DEXTROSE 50 % IV SOLN
0.0000 mL | INTRAVENOUS | Status: DC | PRN
  Filled 2022-06-01: qty 50

## 2022-06-01 NOTE — Progress Notes (Signed)
LTM maint complete - no skin breakdown under:  FP1 FP2 Fz

## 2022-06-01 NOTE — Progress Notes (Signed)
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   Discharge summary reviewed: She initially presented to OSH on 05/20/22 for left upper extremity tremors. She reported recent hand foot any mouth disease contracted from her grandchild that left her weak and tired prior to this admission. Neurology work-up including MRI, EEG negative. MRI/MRA neg for acute infarct, 13 mm focus in left frontal lobe representing artifact vs small calcified meningioma, chronic small vessel ischemic changes, cerebral atrophy, high-grade stenosis in bilateral ICA, focal stenosis in left ACA and intracranial right vertebral artery. MRI of C-spine showing C4-5 cord compression from advanced disc degeneration with posterior endplate ridging.  Showing left foraminal impingement at the same level.  Also showing C5-6 and C6-7 ADCF with solid arthrodesis.  Neurologist at [facility] had recommended neurosurgical evaluation.  She developed fevers and transferred to ICU concerning for meningitis and started on Vanc, rocephin, ampicillin and acyclovir. LP was performed on 7/24 with WBC 55 RBC 2. Pending herpes, enterovirus, ehrlichia. CT A/P showed bilateral ground glass and lung opacities concerning for multifocal pneumonia.   PCCM consulted for decreased responsiveness and hypoxemia on NRB. On exam obtunded and only able to say name. O2 sats in the 70s on NRB. Transferred to the ICU for emergent intubation   Pertinent  Medical History  As above  Significant Hospital Events: Including procedures,  antibiotic start and stop dates in addition to other pertinent events   7/24 Admitted to [facility] from [place]. Transferred to PCCM when found in respiratory failure 7/25 intubated, bronched, art line placed, PICC placed.  Neuro and ID consulted. Started decadron  7/26 decadron dc/d 7/27 felt most consistent with viral meningoencephalitis  7/29-7/30 sudden onset multiorgan failure  Interim History / Subjective:  Afebrile Remains on low-dose Levophed Patient was in status epilepticus arising from left hemisphere, now on burst suppression but is still having intermittent seizures  Objective   Blood pressure (!) 112/51, pulse 76, temperature 98.1 F (36.7 C), temperature source Rectal, resp. rate (!) 25, height 5' 5.98" (1.676 m), weight 91 kg, SpO2 96 %. CVP:  [5 mmHg-10 mmHg] 9 mmHg  Vent Mode: PRVC FiO2 (%):  [40 %] 40 % Set Rate:  [28 bmp] 28 bmp Vt Set:  [480 mL] 480 mL PEEP:  [5 cmH20-8 cmH20] 8 cmH20 Plateau Pressure:  [21 cmH20] 21 cmH20   Intake/Output Summary (Last 24 hours) at 06/01/2022 0910 Last data filed at 06/01/2022 0800 Gross per 24 hour  Intake 4288.13 ml  Output 6506 ml  Net -2217.87 ml   Filed Weights   05/30/22 0500 05/31/22 0309 06/01/22 0439  Weight: 94.3 kg 91.7 kg 91 kg   Physical Exam: Physical exam: General: Crtitically ill-appearing female, orally intubated HEENT: [place]/AT, eyes anicteric.  ETT and OGT in place Neuro: Sedated, not following commands.  Eyes are closed.  Pupils 3 mm bilateral sluggishly reactive to light, weak cough and gag Chest: Coarse breath sounds, no wheezes or rhonchi Heart: Regular rate and rhythm, no murmurs or gallops Abdomen: Soft, nontender, nondistended, bowel sounds present Skin: No rash  Resolved Hospital Problem list  N/A  Assessment & Plan:  Acute encephalopathy in setting of viral meningoencephalitis  Unfortunately patient needs deep sedation to control her status epilepticus, currently RASS is -5 as we are trying to  achieve burst suppression   Acute hypoxemic/hypercapnic respiratory failure 2/2 multilobar PNA  Trach aspirate is growing oral flora, follow-up culture until its speciate Appreciate infectious disease follow-up Antibiotics were switched to ceftriaxone and Flagyl Patient's white count keep going up now is 29,000 Continue lung protective ventilation Not ready for SBT due to deep sedation and status epilepticus  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  [facility] OSH neg, culture neg, arbovirus panel penidng. Critical cervical myopathy - question baseline severe DJD with meningeal swelling threatening critical stenosis cont droplet  Appreciate ID and neuro following  Continue IV steroid, defer to neurology for tapering Arboviral panel pending in [place]  F/u enterovirus PCR from [place] added on by A [place] 7/29  Hypogammaglobinemia Patient is on Rituxan as an outpatient Her immunoglobulin levels were low Oncology is following, recommend giving IVIG  Status epilepticus Hypertriglyceridemia in the setting of propofol infusion Patient remained in status epilepticus, now finally BHU burst suppression but intermittently she is still having seizures Continue Keppra and Vimpat Currently on propofol at 80 mics per KG per hour and Versed at 10 mg/h Will increase Versed to 40 mg/h and decrease propofol due to hypertriglyceridemia Remain on continuous EEG Appreciate neurology follow-up  UTI Treated  DM2 with severe hyperglycemia  She has labile blood sugars, currently continue with insulin infusion, once stabilized we will try to switch her to long-acting  Small lymphocytic B cell non-hodgkin lymphoma On maintenance rituximab (last received April 05 2022)  Follows Dr. Marin Olp Oncology is following Immunoglobulins levels are are low, getting replacement  Sudden onset shock, acute renal failure, acute liver  failure, RV dilation, large dead space, severe metabolic acidosis Suspected saddle pulmonary embolism Patient received tPA, with improvement in hemodynamics Continue IV heparin Remain on on CRRT Metabolic acidosis has improved, bicarbonate infusion was stopped Nephrology is following LFTs started improving  Closely monitor electrolytes and bicarbonate  Best Practice (right click and "Reselect all SmartList Selections" daily)   Diet/type: Tube feeds DVT prophylaxis: heparin gtt GI prophylaxis: PPI Lines: PICC  Foley:  Yes, and it is still needed  Code Status:  full code Last date of multidisciplinary goals of care discussion - 05/24/22. Family updated 8/2 at bedside   Total critical care time: 41 minutes  Performed by: [place] care time was exclusive of separately billable procedures and treating other patients.   Critical care was necessary to treat or prevent imminent or life-threatening deterioration.   Critical care was time spent personally by me on the following activities: development of treatment plan with patient and/or surrogate as well as nursing, discussions with consultants, evaluation of patient's response to treatment, examination of patient, obtaining history from patient or surrogate, ordering and performing treatments and interventions, ordering and review of laboratory studies, ordering and review of radiographic studies, pulse oximetry and re-evaluation of patient's condition.   Jacky Kindle, MD [facility] See Amion for pager If no response to pager, please call [phone] until 7pm After 7pm, Please call E-link [phone]

## 2022-06-01 NOTE — Plan of Care (Signed)
  Problem: Clinical Measurements: Goal: Ability to maintain clinical measurements within normal limits will improve Outcome: Progressing Goal: Will remain free from infection Outcome: Progressing Goal: Diagnostic test results will improve Outcome: Progressing Goal: Respiratory complications will improve Outcome: Progressing Goal: Cardiovascular complication will be avoided Outcome: Progressing   Problem: Activity: Goal: Risk for activity intolerance will decrease Outcome: Progressing   Problem: Nutrition: Goal: Adequate nutrition will be maintained Outcome: Progressing   Problem: Elimination: Goal: Will not experience complications related to bowel motility Outcome: Progressing Goal: Will not experience complications related to urinary retention Outcome: Progressing   Problem: Pain Managment: Goal: General experience of comfort will improve Outcome: Progressing   Problem: Safety: Goal: Ability to remain free from injury will improve Outcome: Progressing   Problem: Skin Integrity: Goal: Risk for impaired skin integrity will decrease Outcome: Progressing   Problem: Fluid Volume: Goal: Ability to maintain a balanced intake and output will improve Outcome: Progressing   Problem: Metabolic: Goal: Ability to maintain appropriate glucose levels will improve Outcome: Progressing

## 2022-06-01 NOTE — Progress Notes (Signed)
[facility] Infectious Diseases Follow Up Note  Patient Identification: Patient Name: Valerie Cline MRN: [id number] [place] Date: 05/09/2022  9:11 PM Age: 66 y.o.Today's Date: 06/01/2022  Reason for Visit: Possible Viral meningoencephalitis/leukocytosis  Principal Problem:   Meningitis Active Problems:   Severe sepsis ([place])   Acute metabolic encephalopathy   Acute respiratory failure with hypoxia (HCC)   Multifocal pneumonia   Meningioma (HCC)   Cervical myelopathy (HCC)   UTI (urinary tract infection)  Meropenem 7/28-7/31 Vancomycin 7/24-7/26, Vancomycin 7/30-7/31 Ceftriaxone 7/24, 7/27, Ceftriaxone 8/1-c Metronidazole 8/1-c Cefepime 7/25-7/26 Ampicillin 7/24-7/25 Acyclovir 7/24-7/26    Lines/Hardwares: RT IJ non tunneled HD Catheter, Left UE PICC, Left radial art line    Interval Events: Remains on low dose levophed, off vasopressin, EEG with status epilepticus and multiple seizures    Assessment #Possible viral meningoencephalitis in the setting of recent hand-foot and mouth disease with positive enterovirus in RVP in an immunocompromised patient Per [facility] Microbiology  CSF cx 7/24 NG, gram stain negative, HSV1 and 2 DNA negative  ( CSF WBC 55, N 84%, L 5%, M 2%, mesothelial cells and other cells 10%, RBC 2) Blood cx 7/21 and 7/22 negative) RMSF IgM and IgG + Lyme total ab 3/71 negative Ehrlichia PCR and Entero virus PCR from CSF reported to be negative   # Seizures/Status Epilepticus  - on antiepileptic - EEG 8/1 status epilepticus, left posterior quadrant; Burst suppression, generalized - Neurology following   # Acute Respiratory Failure in the setting of encephalopathy + multolobar PNA 7/25 BAL cx AFB negative, gram stain and cx negative, fungal cx pending  7/28 tracheal aspirate cx normal resp flora  #Small lymphocytic B cell non-Hodgkin lymphoma # Hypogammoglobulinemia  On maintenance rituximab, last  received June, 2023.   Follows Dr. Marin Olp 7/31 IGG/IGA low, IGM WNL, started on IVIG 8/2  #Suspected saddle pulmonary embolism in the setting of sudden onset shock/acute renal and liver failure ( Sock liver), RV dilatation with severe metabolic acidosis Status post tPA with improvement in hemodynamics, on IV heparin On CRRT, nephrology following 7/30 Liver US s/o cirrhosis, Liver enzymes down trending   # Critical Cervical Myelopathy/ACDF C5-C7/Progressive Weakness and Tremors  On solumedrol, Neurology following   Recommendations Continue ceftriaxone and metronidazole, unlikely leukocytosis related to switch of abtx. Patient is also on steroids. Low concerns for bacterial process. afebrile and will monitor Fu Hepatitis B surface ag  Fu Final BAL cx Will follow along with you   Rest of the management as per the primary team. Thank you for the consult. Please page with pertinent questions or concerns. _____________________________________________________________________ Subjective patient seen and examined at the bedside. Spoke with daughter at bedside Fentanyl, versed, propofol,  heparin, insulin, low dose vasopressin   Vitals BP (!) 106/47   Pulse 72   Temp 98.1 F (36.7 C) (Rectal)   Resp (!) 28   Ht 5' 5.98" (1.676 m)   Wt 91 kg   SpO2 96%   BMI 32.40 kg/m   Physical Exam Constitutional: Orotracheally intubated, on CRRT    Comments: EEG leads on head , Generalized edema  Cardiovascular:     Rate and Rhythm: Normal rate and regular rhythm.     Heart sounds:   Pulmonary:     [place]ort: Pulmonary effort is normal on mechanical ventilation    Comments: Coarse breath sounds  Abdominal:     Palpations: Abdomen is soft.     Tenderness: Nondistended and nontender  Musculoskeletal:        General:  No swelling  in peripheral joints.   Skin:    Comments: RT IJ non tunneled HD Catheter, Left UE PICC, Left radial art line   Neurological:     General: Unresponsive    Pertinent Microbiology Results for orders placed or performed during the hospital encounter of 05/06/2022  MRSA Next Gen by PCR, Nasal     Status: None   Collection Time: 05/20/2022  9:42 PM   Specimen: Nasal Mucosa; Nasal Swab  Result Value Ref Range Status   MRSA by PCR Next Gen NOT DETECTED NOT DETECTED Final    Comment: (NOTE) The GeneXpert MRSA Assay (FDA approved for NASAL specimens only), is one component of a comprehensive MRSA colonization surveillance program. It is not intended to diagnose MRSA infection nor to guide or monitor treatment for MRSA infections. Test performance is not FDA approved in patients less than 62 years old. Performed at [facility], [place] [address], [place], [place] [address]   Urine Culture     Status: None   Collection Time: 05/24/22  2:11 AM   Specimen: Urine, Clean Catch  Result Value Ref Range Status   Specimen Description URINE, CLEAN CATCH  Final   Special Requests NONE  Final   Culture   Final    NO GROWTH Performed at [facility], [address], [place], [place] [address]    Report Status 05/25/2022 FINAL  Final  Culture, blood (Routine X 2) w Reflex to ID Panel     Status: None   Collection Time: 05/24/22  6:16 AM   Specimen: BLOOD RIGHT HAND  Result Value Ref Range Status   Specimen Description BLOOD RIGHT HAND  Final   Special Requests   Final    BOTTLES DRAWN AEROBIC AND ANAEROBIC Blood Culture adequate volume   Culture   Final    NO GROWTH 5 DAYS Performed at [facility], [place] [address] [place], [place] [address]    Report Status 05/29/2022 FINAL  Final  Culture, blood (Routine X 2) w Reflex to ID Panel     Status: None   Collection Time: 05/24/22  6:17 AM   Specimen: BLOOD LEFT HAND  Result Value Ref Range Status   Specimen Description BLOOD LEFT HAND  Final   Special Requests   Final    BOTTLES DRAWN AEROBIC AND ANAEROBIC Blood Culture adequate volume   Culture   Final    NO GROWTH 5 DAYS Performed  at [facility], [place] [address], [place], [place] [address]    Report Status 05/29/2022 FINAL  Final  Culture, Respiratory w Gram Stain     Status: None   Collection Time: 05/24/22  9:24 AM   Specimen: Bronchoalveolar Lavage; Respiratory  Result Value Ref Range Status   Specimen Description BRONCHIAL ALVEOLAR LAVAGE  Final   Special Requests Immunocompromised  Final   Gram Stain   Final    RARE WBC PRESENT, PREDOMINANTLY MONONUCLEAR NO ORGANISMS SEEN    Culture   Final    NO GROWTH 2 DAYS Performed at [facility], [address], [place], [place] [address]    Report Status 05/26/2022 FINAL  Final  Acid Fast Smear (AFB)     Status: None   Collection Time: 05/24/22  9:24 AM   Specimen: Bronchial Alveolar Lavage  Result Value Ref Range Status   AFB Specimen Processing Concentration  Final   Acid Fast Smear Negative  Final    Comment: (NOTE)  Performed At: [facility] [place], [place] [id number] Rush Farmer MD OZ:[id number]    Source (AFB) BRONCHIAL ALVEOLAR LAVAGE  Final    Comment: Performed at [facility], [place] [address], [place], [place] [address]  Fungus Culture With Stain     Status: None (Preliminary result)   Collection Time: 05/24/22  9:24 AM   Specimen: Bronchial Alveolar Lavage  Result Value Ref Range Status   Fungus Stain Final report  Final    Comment: (NOTE) Performed At: [facility] [place], [place] [id number] Rush Farmer MD UU:[id number]    Fungus (Mycology) Culture PENDING  Incomplete   Fungal Source BRONCHIAL ALVEOLAR LAVAGE  Final    Comment: Performed at [facility], [place] [address] [place], [place] [address]  Respiratory (~20 pathogens) panel by PCR     Status: None   Collection Time: 05/24/22  9:24 AM   Specimen: Bronchial Alveolar Lavage; Respiratory  Result Value Ref Range Status   Adenovirus NOT DETECTED NOT DETECTED Final   Coronavirus 229E NOT DETECTED NOT DETECTED  Final    Comment: (NOTE) The Coronavirus on the Respiratory Panel, DOES NOT test for the novel  Coronavirus (2019 nCoV)    Coronavirus HKU1 NOT DETECTED NOT DETECTED Final   Coronavirus NL63 NOT DETECTED NOT DETECTED Final   Coronavirus OC43 NOT DETECTED NOT DETECTED Final   Metapneumovirus NOT DETECTED NOT DETECTED Final   Rhinovirus / Enterovirus NOT DETECTED NOT DETECTED Final   Influenza A NOT DETECTED NOT DETECTED Final   Influenza B NOT DETECTED NOT DETECTED Final   Parainfluenza Virus 1 NOT DETECTED NOT DETECTED Final   Parainfluenza Virus 2 NOT DETECTED NOT DETECTED Final   Parainfluenza Virus 3 NOT DETECTED NOT DETECTED Final   Parainfluenza Virus 4 NOT DETECTED NOT DETECTED Final   Respiratory Syncytial Virus NOT DETECTED NOT DETECTED Final   Bordetella pertussis NOT DETECTED NOT DETECTED Final   Bordetella Parapertussis NOT DETECTED NOT DETECTED Final   Chlamydophila pneumoniae NOT DETECTED NOT DETECTED Final   Mycoplasma pneumoniae NOT DETECTED NOT DETECTED Final    Comment: Performed at [facility], [place]. [address] [place], [place] [address]  Fungus Culture Result     Status: None   Collection Time: 05/24/22  9:24 AM  Result Value Ref Range Status   Result 1 Comment  Final    Comment: (NOTE) KOH/Calcofluor preparation:  no fungus observed. Performed At: [facility] [place], [place] [id number] Rush Farmer MD JO:[id number]   Culture, Respiratory w Gram Stain     Status: None   Collection Time: 05/27/22  8:31 AM   Specimen: Tracheal Aspirate; Respiratory  Result Value Ref Range Status   Specimen Description TRACHEAL ASPIRATE  Final   Special Requests NONE  Final   Gram Stain   Final    ABUNDANT WBC PRESENT, PREDOMINANTLY PMN FEW GRAM NEGATIVE RODS RARE GRAM POSITIVE COCCI IN PAIRS    Culture   Final    Normal respiratory flora-no Staph aureus or Pseudomonas seen Performed at [facility], [address], [place],  [place] [address]    Report Status 05/29/2022 FINAL  Final  Respiratory (~20 pathogens) panel by PCR     Status: Abnormal   Collection Time: 05/28/22  1:53 PM   Specimen: Nasopharyngeal Swab; Respiratory  Result Value Ref Range Status   Adenovirus NOT DETECTED NOT DETECTED Final   Coronavirus 229E NOT DETECTED NOT DETECTED Final  Comment: (NOTE) The Coronavirus on the Respiratory Panel, DOES NOT test for the novel  Coronavirus (2019 nCoV)    Coronavirus HKU1 NOT DETECTED NOT DETECTED Final   Coronavirus NL63 NOT DETECTED NOT DETECTED Final   Coronavirus OC43 NOT DETECTED NOT DETECTED Final   Metapneumovirus NOT DETECTED NOT DETECTED Final   Rhinovirus / Enterovirus DETECTED (A) NOT DETECTED Final   Influenza A NOT DETECTED NOT DETECTED Final   Influenza B NOT DETECTED NOT DETECTED Final   Parainfluenza Virus 1 NOT DETECTED NOT DETECTED Final   Parainfluenza Virus 2 NOT DETECTED NOT DETECTED Final   Parainfluenza Virus 3 NOT DETECTED NOT DETECTED Final   Parainfluenza Virus 4 NOT DETECTED NOT DETECTED Final   Respiratory Syncytial Virus NOT DETECTED NOT DETECTED Final   Bordetella pertussis NOT DETECTED NOT DETECTED Final   Bordetella Parapertussis NOT DETECTED NOT DETECTED Final   Chlamydophila pneumoniae NOT DETECTED NOT DETECTED Final   Mycoplasma pneumoniae NOT DETECTED NOT DETECTED Final    Comment: Performed at [facility], [place] [address] [place], [place] [address]  Culture, blood (Routine X 2) w Reflex to ID Panel     Status: None (Preliminary result)   Collection Time: 05/29/22  8:58 AM   Specimen: BLOOD RIGHT HAND  Result Value Ref Range Status   Specimen Description BLOOD RIGHT HAND  Final   Special Requests   Final    BOTTLES DRAWN AEROBIC AND ANAEROBIC Blood Culture adequate volume   Culture   Final    NO GROWTH 3 DAYS Performed at [facility], [place] [address], [place], [place] [address]    Report Status PENDING  Incomplete  Culture, blood (Routine X 2) w  Reflex to ID Panel     Status: None (Preliminary result)   Collection Time: 05/29/22  8:58 AM   Specimen: BLOOD RIGHT HAND  Result Value Ref Range Status   Specimen Description BLOOD RIGHT HAND  Final   Special Requests IN PEDIATRIC BOTTLE Blood Culture adequate volume  Final   Culture   Final    NO GROWTH 3 DAYS Performed at [facility], [place] [address], [place], [place] [address]    Report Status PENDING  Incomplete   Pertinent Lab.    Latest Ref Rng & Units 06/01/2022    4:00 AM 05/31/2022    4:17 AM 05/30/2022    4:55 AM  CBC  WBC 4.0 - 10.5 K/uL 29.5  12.9  9.1   Hemoglobin 12.0 - 15.0 g/dL 9.4  8.1  8.4   Hematocrit 36.0 - 46.0 % 29.8  25.7  27.2   Platelets 150 - 400 K/uL 294  167  148       Latest Ref Rng & Units 06/01/2022    4:00 AM 05/31/2022    2:56 PM 05/31/2022    7:40 AM  CMP  Glucose 70 - 99 mg/dL 427  378  346   BUN 8 - 23 mg/dL 46  47  46   Creatinine 0.44 - 1.00 mg/dL 1.18  1.34  1.58   Sodium 135 - 145 mmol/L 137  138  138   Potassium 3.5 - 5.1 mmol/L 4.5  4.3  4.3   Chloride 98 - 111 mmol/L 100  103  103   CO2 22 - 32 mmol/L _0 Calcium 8.9 - 10.3 mg/dL 7.8  7.7  7.5   Total Protein 6.5 - 8.1 g/dL   4.7  Total Bilirubin 0.3 - 1.2 mg/dL   1.4   Alkaline Phos 38 - 126 U/L   146   AST 15 - 41 U/L   2,519   ALT 0 - 44 U/L   3,408      Pertinent Imaging today Plain films and CT images have been personally visualized and interpreted; radiology reports have been reviewed. Decision making incorporated into the Impression / Recommendations.  No results found.   I spent 52  minutes for this patient encounter including review of prior medical records, coordination of care with primary/other specialist with greater than 50% of time being face to face/counseling and discussing diagnostics/treatment plan with the patient/family.  Electronically signed by:   Rosiland Oz, MD Infectious Disease Physician [facility] for  Infectious Disease Pager: [phone]

## 2022-06-01 NOTE — Progress Notes (Signed)
ANTICOAGULATION CONSULT NOTE- follow-up  Pharmacy Consult for heparin Indication: pulmonary embolus  Allergies  Allergen Reactions   Canagliflozin Other (See Comments)    recurrent vaginitis   Glucophage [Metformin] Other (See Comments)    GI intolerance   Niaspan [Niacin] Other (See Comments)    flushing    Patient Measurements: Height: 5' 5.98" (167.6 cm) Weight: 91 kg (200 lb 9.9 oz) IBW/kg (Calculated) : 59.25 Heparin Dosing Weight: 94.3kg  Vital Signs: Temp: 97.9 F (36.6 C) (08/02 0631) Temp Source: Rectal (08/02 0000) BP: 123/60 (08/02 0600) Pulse Rate: 72 (08/02 0631)  Labs: Recent Labs    05/29/22 1002 05/29/22 1226 05/30/22 0455 05/30/22 1153 05/31/22 0417 05/31/22 0740 05/31/22 1456 06/01/22 0400  HGB  --    < > 8.4*  --  8.1*  --   --  9.4*  HCT  --    < > 27.2*  --  25.7*  --   --  29.8*  PLT  --    < > 148*  --  167  --   --  294  APTT 40*  --   --   --   --   --   --   --   LABPROT 23.3*  --   --   --   --   --   --   --   INR 2.1*  --   --   --   --   --   --   --   HEPARINUNFRC  --    < > 0.12*   < > 0.32  --  0.46 0.50  CREATININE  --    < > 2.03*   < > 1.53* 1.58* 1.34* 1.18*   < > = values in this interval not displayed.     Estimated Creatinine Clearance: 52.6 mL/min (A) (by C-G formula based on SCr of 1.18 mg/dL (H)).   Assessment: 76 yoF admitted with meningitis from OSH. Pt with ongoing fevers and worsening clinical status 7/29, RV severely down on beside ECHO so concerns for acute PE. Pt received alteplase on 7/30 for suspected saddle PE.  Heparin level therapeutic at 0.5. CBC stable, no bleeding noted.  Goal of Therapy:  Heparin level 0.3-0.7 units/mL Monitor platelets by anticoagulation protocol: Yes   Plan:  Cont heparin 2050 units/hr CBC / heparin level daily  Thank you for allowing pharmacy to participate in this patient's care.  Reatha Harps, PharmD PGY2 Pharmacy Resident 06/01/2022 6:55 AM Check [email or web address] for unit  specific pharmacy number

## 2022-06-01 NOTE — TOC Initial Note (Signed)
Transition of Care [facility]) - Initial/Assessment Note    Patient Details  Name: Valerie Cline MRN: [id number] Date of Birth: 03-May-1956  [facility] CM/SW Contact:    Bethena Roys, RN Phone Number: 06/01/2022, 11:24 AM  Clinical Narrative:  Patient was discussed in progression rounds today. Patient is a transfer from [facility]. Patient presented for respiratory failure and intubated. Patient continues on IV rocephin, Keppra, and Fentanyl-remains on CRRT. Case Manager will continue to follow for transition of care needs as the patient progresses.           Expected Discharge Plan:  (TBD) Barriers to Discharge: Continued Medical Work up   Expected Discharge Plan and Services Expected Discharge Plan:  (TBD)   Discharge Planning Services: CM Consult   Living arrangements for the past 2 months: Single Family Home                   DME Agency: NA    Prior Living Arrangements/Services Living arrangements for the past 2 months: Single Family Home Lives with:: Spouse          Need for Family Participation in Patient Care: Yes (Comment) Care giver support system in place?: Yes (comment)      Activities of Daily Living Home Assistive Devices/Equipment:  (UTA) ADL Screening (condition at time of admission) Patient's cognitive ability adequate to safely complete daily activities?: No Is the patient deaf or have difficulty hearing?: No Does the patient have difficulty seeing, even when wearing glasses/contacts?: No Does the patient have difficulty concentrating, remembering, or making decisions?: Yes Patient able to express need for assistance with ADLs?: No Does the patient have difficulty dressing or bathing?: Yes Independently performs ADLs?: No Communication: Dependent Is this a change from baseline?: Change from baseline, expected to last >3 days Dressing (OT): Dependent Is this a change from baseline?: Change from baseline, expected to last  >3 days Grooming: Dependent Is this a change from baseline?: Change from baseline, expected to last >3 days Feeding: Dependent Is this a change from baseline?: Change from baseline, expected to last >3 days Bathing: Dependent Is this a change from baseline?: Change from baseline, expected to last >3 days Toileting: Dependent Is this a change from baseline?: Change from baseline, expected to last >3days In/Out Bed: Dependent Is this a change from baseline?: Change from baseline, expected to last >3 days Walks in Home: Dependent Is this a change from baseline?: Change from baseline, expected to last >3 days Does the patient have difficulty walking or climbing stairs?: Yes Weakness of Legs: Both Weakness of Arms/Hands: Both  Emotional Assessment Appearance:: Appears stated age       Alcohol / Substance Use: Not Applicable Psych Involvement: No (comment)  Admission diagnosis:  Severe sepsis ([place]) [A41.9, R65.20] Patient Active Problem List   Diagnosis Date Noted   Severe sepsis ([place]) 05/24/2022   Meningitis 93/26/7124   Acute metabolic encephalopathy 58/07/9832   Acute respiratory failure with hypoxia ([place]) 05/24/2022   Multifocal pneumonia 05/24/2022   Meningioma ([place]) 05/24/2022   Cervical myelopathy ([place]) 05/24/2022   UTI (urinary tract infection) 05/24/2022   S/P lumbar fusion 82/50/5397   Complication of anesthesia 11/30/2021   PONV (postoperative nausea and vomiting) 11/30/2021   Lumbar radiculopathy 11/30/2021   Skin sensation disturbance 11/30/2021   Preoperative cardiovascular examination 11/30/2021   History of adenomatous polyp of colon 03/16/2021   Thyroid nodule 03/16/2021   Hypertriglyceridemia 03/16/2021   Hyperglycemia due to type 2 diabetes mellitus ([place])  03/09/2021   Long term (current) use of insulin ([place]) 03/09/2021   Allergy    Blood in stool    Carotid stenosis    Diabetes mellitus without complication (HCC)    Elevated liver function tests     Hypertension    Coronary artery calcification seen on CT scan 06/01/2020   Type 2 diabetes mellitus with unspecified complications ([place]) 62/83/6629   Mixed dyslipidemia 06/01/2020   Hardening of the aorta (main artery of the heart) ([place]) 01/13/2020   Atherosclerotic heart disease of native coronary artery without angina pectoris 01/13/2020   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 ([place]) 12/30/2019   CLL (chronic lymphocytic leukemia) ([place]) 12/26/2019   Personal history of COVID-19 11/13/2019   Malignant lymphoma, small lymphocytic ([place]) 11/13/2019   Pneumonia due to COVID-19 virus 11/08/2019   T2DM (type 2 diabetes mellitus) ([place]) 11/08/2019   Cervical lymphadenopathy 10/31/2019   Goals of care, counseling/discussion 10/28/2019   Lymphadenopathy, abdominal 10/28/2019   Fatty liver 09/06/2012   Obesity 04/19/2012   Carotid artery occlusion 08/13/2009   Essential hypertension 06/08/2009   Hyperlipidemia 06/08/2009   Diabetic peripheral neuropathy associated with type 2 diabetes mellitus ([place]) 06/08/2009   PCP:  Ginger Organ., MD Pharmacy:   [facility] [place], [place] [address] [place] [place] [address] Phone: [phone] Fax: [phone]   Readmission Risk Interventions     No data to display

## 2022-06-01 NOTE — Procedures (Signed)
Patient Name: Valerie Cline  MRN: [id number]  Epilepsy Attending: Lora Havens  Referring Physician/Provider: Greta Doom, MD   Duration: 05/31/2022 1014 to 06/01/2022 1014   Patient history: 66yo F with ams. EEG to evaluate for seizure   Level of alertness: lethargic, asleep   AEDs during EEG study: propofol, Keppra, Vimpat   Technical aspects: This EEG study was done with scalp electrodes positioned according to the 10-20 International system of electrode placement. Electrical activity was acquired at a sampling rate of '500Hz'$  and reviewed with a high frequency filter of '70Hz'$  and a low frequency filter of '1Hz'$ . EEG data were recorded continuously and digitally stored.    Description: At the beginning of the study, EEG showed status epilepticus arising from left posterior quadrant.  No clinical signs were seen during the study.  IV propofol was titrated and eventually status epilepticus resolved at 1254 on 05/31/2022.  After that EEG showed burst suppression pattern with bursts of generalized polymorphic sharply contoured 3 to 7 Hz theta-delta slowing lasting 3 to 7 seconds alternating with about 15 seconds of EEG suppression.  Fourteen seizures were seen between 05/31/2022 1254 to 0900. Last seizure was at 1010. Initially, the duration of seizure was about 6 minutes.  Gradually, the duration improved to 2 minutes.  ABNORMALITY -Electrographic status epilepticus, left posterior quadrant -Burst suppression, generalized   IMPRESSION: This study initially showed electrographic status epilepticus arising from left posterior quadrant.  As propofol was titrated, status epilepticus resolved on 05/31/2022 at 1254.  EEG was then suggestive of profound diffuse encephalopathy likely due to sedation.  Fourteen seizures were seen between 05/31/2022 1254 to 0900. Last seizure was at 1010. Initially, the duration of seizure was about 6 minutes.  Gradually, the duration improved to 2 minutes.  [name] [name]

## 2022-06-01 NOTE — Progress Notes (Signed)
eLink Physician-Brief Progress Note Patient Name: XAVIER FOURNIER DOB: 05/10/1956 MRN: [id number]   Date of Service  06/01/2022  HPI/Events of Note  Blood sugar 406 mg / dl.  eICU Interventions  Insulin gtt ordered.        Kerry Kass Kaula Klenke 06/01/2022, 4:33 AM

## 2022-06-01 NOTE — Progress Notes (Signed)
[facility] Progress Note    66 y.o. year-old w/ hx HTN, hL, DM, CAD, carotid stenosis and hx NHL last chemoRx June 2023 presented to OSH on 7/21 for LUE tremors w/ recent hand-foot-and mouth disease. Work-up showed neg MRI of brain but MRI of C-spine showed sig cord compression at C4-5. Decompensated w/ high givers and AMS, concerning for meningitis and was moved to ICU. LP showed wbc 55 and CT showed multifocal PNA. Pt transferred to [facility] on 7/24. On 7/25 pt become poorly responsiveness and was intubated and moved to ICU. Fevers continued and pt developed hypotension/ shock on 7/29 and started on vaso and levo gtts.  Creat 0.8 on admit and cont to rise with hyperkalemia as well -> CRRT.    Assessment/ Plan:   AKI - b/l creat 0.8 on admission. Creat rising now in setting of new onset shock. Pt admitted for viral meningoencephalitis complicated by critical cervical myopathy and now resp failure w/ multifocal PNA. Pt has lymphoma receiving active chemoRx (rituximab) w/ last dose in June 2023. AKI due to shock which started in last 24 hrs abruptly. CCM suspecting acute PE, s/p TPA and now on IV heparin. K+ up to 6.8 -> CCRT 7/30 w/ all 2K fluids -> 4K now. No signs of renal recovery. Very minimal UOP.  Seen on CRRT On 4K fluids  500/400/1500 pre/post/qd Now back on low dose Levophed (primarily bec of the heavy sedation) and tolerating 50 ml/hr -> will increase to 171m/hr as tolerated. Updated sister and daughter who were bedside.  Volume - mod edema on exam, I/O + 13.5 L since admit, wt's up 7 kg. Tolerating 527mhr and will try 10036mr. Shock - maybe from multilobar PNA + viral menigoencephalitis, as above, on vasopressor support,  H/o lymphoma - getting active chemoRx w/ last dose rituxan in June 2023 AHRF - on vent, multifocal PNA on broad spec IV abx per CCM Status epilepticus on propofol AMS - in setting of viral meningoencephalitis  Cervical myelopathy  DM2 - on  insulin  Subjective:   Sister and daughter bedside; tolerating UF now that she's back on Levophed.   Objective:   BP (!) 100/46   Pulse 69   Temp 98.8 F (37.1 C)   Resp (!) 28   Ht 5' 5.98" (1.676 m)   Wt 91 kg   SpO2 97%   BMI 32.40 kg/m   Intake/Output Summary (Last 24 hours) at 06/01/2022 1205 Last data filed at 06/01/2022 1100 Gross per 24 hour  Intake 4535.31 ml  Output 6293 ml  Net -1757.69 ml   Weight change: -0.7 kg  Physical Exam: Gen on vent, sedated No rash, cyanosis or gangrene Sclera anicteric, throat w/ ETT No jvd or bruits Chest clear anterior/ latera RRR no RG Abd soft ntnd no mass or ascites +bs GU foley draining clear light amber urine Ext 1+ bilat UE > LE edema, no wounds or ulcers Neuro is on vent, sedated  Imaging: Overnight EEG with video  Result Date: 05/31/2022 YadLora HavensD     05/31/2022  2:52 PM Patient Name: Valerie BARCUSN: [id number] Attending: PriLora Havensferring Physician/Provider: KirGreta DoomD  Duration: 05/30/2022 1014 to 05/31/2022 1014  Patient history: [id number] ams. EEG to evaluate for seizure  Level of alertness: lethargic, asleep  AEDs during EEG study: propofol, Keppra, Vimpat  Technical aspects: This EEG study was done with scalp electrodes positioned according to the 10-20 International system of electrode  placement. Electrical activity was acquired at a sampling rate of '500Hz'$  and reviewed with a high frequency filter of '70Hz'$  and a low frequency filter of '1Hz'$ . EEG data were recorded continuously and digitally stored.  Description: At the beginning of the study, EEG showed seizures arising from left posterior quadrant. During the seizure, initially patient had no clinical signs.  She then had sudden brief eye-opening.  Concomitant EEG showed spike and wave in left posterior quadrant which then involved all of left hemisphere and right posterior quadrant. Nine seizures were noted between 1015 to 1500 on  05/30/2022.  After 1500, frequency of seizures gradually worsened.  IV Vimpat was added.  However, EEG continued to worsen and showed status epilepticus.  At times patient was noted to have eye-opening and full body jerking but during most of the EEG, patient did not have any clinical signs.  IV propofol was added without resolution of status epilepticus. ABNORMALITY -Convulsive status epilepticus, left posterior quadrant  IMPRESSION: This study showed status epilepticus arising from left posterior quadrant.  During most of the EEG, no clinical signs were seen.  However at times patient was noted to have eye-opening and whole body jerking. [place]: BMET Recent Labs  Lab 05/29/22 [phone] 05/29/22 0546 05/29/22 1801 05/30/22 0455 05/30/22 1600 05/31/22 0417 05/31/22 0740 05/31/22 1456 06/01/22 0400  NA 139   < > 146* 143 140 138 138 138 137  K 6.8*   < > 4.7 4.3 4.5 4.2 4.3 4.3 4.5  CL 100   < > 104 102 100 102 103 103 100  CO2 30   < > '26 27 26 26 26 26 24  '$ GLUCOSE 484*   < > 257* 166* 220* 320* 346* 378* 427*  BUN 56*   < > 70* 48* 44* 46* 46* 47* 46*  CREATININE 2.22*   < > 2.79* 2.03* 1.77* 1.53* 1.58* 1.34* 1.18*  CALCIUM 7.7*   < > 7.1* 7.6* 7.6* 7.6* 7.5* 7.7* 7.8*  PHOS 8.5*  --  6.9* 5.1* 4.2 3.7  --  4.3 3.7   < > = values in this interval not displayed.   CBC Recent Labs  Lab 05/29/22 1226 05/30/22 0455 05/31/22 0417 06/01/22 0400  WBC 20.2* 9.1 12.9* 29.5*  HGB 10.0* 8.4* 8.1* 9.4*  HCT 34.7* 27.2* 25.7* 29.8*  MCV 83.8 79.3* 78.1* 79.5*  PLT 260 148* 167 294    Medications:     Chlorhexidine Gluconate Cloth  6 each Topical Q0600   Chlorhexidine Gluconate Cloth  6 each Topical Daily   docusate  100 mg Per Tube BID   docusate  100 mg Per Tube BID   ipratropium-albuterol  3 mL Nebulization Q4H   methylPREDNISolone (SOLU-MEDROL) injection  125 mg Intravenous Q12H   metroNIDAZOLE  500 mg Per Tube Q12H   multivitamin  1 tablet Per Tube QHS   mouth  rinse  15 mL Mouth Rinse Q2H   pantoprazole sodium  40 mg Per Tube Daily   polyethylene glycol  17 g Per Tube Daily   sodium chloride flush  10-40 mL Intracatheter Q12H   sodium chloride flush  10-40 mL Intracatheter Q12H      Otelia Santee, MD 06/01/2022, 12:05 PM

## 2022-06-01 NOTE — Progress Notes (Addendum)
Subjective: Predominantly in burst suppression overnight but continued to have breakthrough seizures  ROS: Unable to obtain due to poor mental status  Examination  Vital signs in last 24 hours: Temp:  [93.7 F (34.3 C)-98.4 F (36.9 C)] 98.1 F (36.7 C) (08/02 0800) Pulse Rate:  [60-80] 72 (08/02 1015) Resp:  [17-28] 28 (08/02 1015) BP: (92-152)/(42-80) 106/47 (08/02 1000) SpO2:  [93 %-100 %] 96 % (08/02 1015) Arterial Line BP: (89-218)/(37-74) 133/46 (08/02 1015) FiO2 (%):  [40 %] 40 % (08/02 0800) Weight:  [91 kg] 91 kg (08/02 0439)  General: lying in bed, NAD Neuro: On propofol and versed, comatose, pupils about 3 mm not reacting to light, corneal reflex absent, gag reflex absent, does not withdraw to noxious stimuli in all extremities  Basic Metabolic Panel: Recent Labs  Lab 05/28/22 0324 05/29/22 0229 05/29/22 0546 05/30/22 0455 05/30/22 1600 05/31/22 0417 05/31/22 0740 05/31/22 1456 06/01/22 0400  NA 143 139   < > 143 140 138 138 138 137  K 4.1 6.8*   < > 4.3 4.5 4.2 4.3 4.3 4.5  CL 105 100   < > 102 100 102 103 103 100  CO2 32 30   < > '27 26 26 26 26 24  '$ GLUCOSE 197* 484*   < > 166* 220* 320* 346* 378* 427*  BUN 35* 56*   < > 48* 44* 46* 46* 47* 46*  CREATININE 1.01* 2.22*   < > 2.03* 1.77* 1.53* 1.58* 1.34* 1.18*  CALCIUM 7.8* 7.7*   < > 7.6* 7.6* 7.6* 7.5* 7.7* 7.8*  MG 2.0 2.4  --  2.3  --  2.6*  --   --  2.9*  PHOS 3.3 8.5*   < > 5.1* 4.2 3.7  --  4.3 3.7   < > = values in this interval not displayed.    CBC: Recent Labs  Lab 05/29/22 0229 05/29/22 1226 05/30/22 0455 05/31/22 0417 06/01/22 0400  WBC 20.5* 20.2* 9.1 12.9* 29.5*  HGB 9.4* 10.0* 8.4* 8.1* 9.4*  HCT 32.8* 34.7* 27.2* 25.7* 29.8*  MCV 87.5 83.8 79.3* 78.1* 79.5*  PLT 403* 260 148* 167 294     Coagulation Studies: No results for input(s): "LABPROT", "INR" in the last 72 hours.  Imaging No new brain imaging overnight  ASSESSMENT AND PLAN: 66 year old female with suspected viral  meningo-encephalitis (had hand-foot-and-mouth disease prior to presentation) now with status epilepticus.   Focal convulsive status epilepticus, resolved Provoked seizures Acute encephalopathy, infectious Viral meningo-encephalitis Left meningioma Hypertriglyceridemia -Status epilepticus likely due to viral encephalitis, meningioma (meningiomas do not commonly cause status epilepticus but the location of this meningioma is concerning).   -EEG improving   Recommendations -Reduce propofol to 60 mcg/h due to hypertriglyceridemia.  We will plan to wean further if tolerated -Increase Versed to 40 mL/h. -We will plan to wean sedation tomorrow -Continue Keppra 1 g twice daily (renally dosed), Vimpat 200 mg twice daily -We will obtain CT head without contrast to look for cerebral edema. -Discussed plan with patient's daughter and patient's sister at bedside.  Family wants everything done including pentobarbital and  tracheostomy if needed -Continue seizure precautions   CRITICAL CARE Performed by: Lora Havens    Total critical care time: 45 minutes   Critical care time was exclusive of separately billable procedures and treating other patients.   Critical care was necessary to treat or prevent imminent or life-threatening deterioration.   Critical care was time spent personally by me on the following  activities: development of treatment plan with patient and/or surrogate as well as nursing, discussions with consultants, evaluation of patient's response to treatment, examination of patient, obtaining history from patient or surrogate, ordering and performing treatments and interventions, ordering and review of laboratory studies, ordering and review of radiographic studies, pulse oximetry and re-evaluation of patient's condition.    Zeb Comfort Epilepsy [facility] For questions after 5pm please refer to AMION to reach the Neurologist on call

## 2022-06-01 NOTE — Progress Notes (Signed)
Pt transported from [place] to CT via ventilator with no complications noted.

## 2022-06-02 DIAGNOSIS — E8721 Acute metabolic acidosis: Secondary | ICD-10-CM

## 2022-06-02 DIAGNOSIS — G9341 Metabolic encephalopathy: Secondary | ICD-10-CM | POA: Diagnosis not present

## 2022-06-02 DIAGNOSIS — G959 Disease of spinal cord, unspecified: Secondary | ICD-10-CM | POA: Diagnosis not present

## 2022-06-02 DIAGNOSIS — K7290 Hepatic failure, unspecified without coma: Secondary | ICD-10-CM

## 2022-06-02 DIAGNOSIS — G039 Meningitis, unspecified: Secondary | ICD-10-CM | POA: Diagnosis not present

## 2022-06-02 DIAGNOSIS — R4182 Altered mental status, unspecified: Secondary | ICD-10-CM | POA: Diagnosis not present

## 2022-06-02 DIAGNOSIS — J9601 Acute respiratory failure with hypoxia: Secondary | ICD-10-CM | POA: Diagnosis not present

## 2022-06-02 DIAGNOSIS — G40901 Epilepsy, unspecified, not intractable, with status epilepticus: Secondary | ICD-10-CM | POA: Diagnosis not present

## 2022-06-02 LAB — RENAL FUNCTION PANEL
Albumin: 2.4 g/dL — ABNORMAL LOW (ref 3.5–5.0)
Albumin: 2.4 g/dL — ABNORMAL LOW (ref 3.5–5.0)
Anion gap: 10 (ref 5–15)
Anion gap: 7 (ref 5–15)
BUN: 40 mg/dL — ABNORMAL HIGH (ref 8–23)
BUN: 41 mg/dL — ABNORMAL HIGH (ref 8–23)
CO2: 23 mmol/L (ref 22–32)
CO2: 25 mmol/L (ref 22–32)
Calcium: 7.6 mg/dL — ABNORMAL LOW (ref 8.9–10.3)
Calcium: 7.7 mg/dL — ABNORMAL LOW (ref 8.9–10.3)
Chloride: 102 mmol/L (ref 98–111)
Chloride: 104 mmol/L (ref 98–111)
Creatinine, Ser: 1.15 mg/dL — ABNORMAL HIGH (ref 0.44–1.00)
Creatinine, Ser: 1.07 mg/dL — ABNORMAL HIGH (ref 0.44–1.00)
GFR, Estimated: 52 mL/min — ABNORMAL LOW (ref >60)
GFR, Estimated: 57 mL/min — ABNORMAL LOW (ref >60)
Glucose, Bld: 186 mg/dL — ABNORMAL HIGH (ref 70–99)
Glucose, Bld: 149 mg/dL — ABNORMAL HIGH (ref 70–99)
Phosphorus: 3.7 mg/dL (ref 2.5–4.6)
Phosphorus: 3.7 mg/dL (ref 2.5–4.6)
Potassium: 4.5 mmol/L (ref 3.5–5.1)
Potassium: 5.3 mmol/L — ABNORMAL HIGH (ref 3.5–5.1)
Sodium: 135 mmol/L (ref 135–145)
Sodium: 136 mmol/L (ref 135–145)

## 2022-06-02 LAB — GLUCOSE, CAPILLARY
Glucose-Capillary: 124 mg/dL — ABNORMAL HIGH (ref 70–99)
Glucose-Capillary: 134 mg/dL — ABNORMAL HIGH (ref 70–99)
Glucose-Capillary: 143 mg/dL — ABNORMAL HIGH (ref 70–99)
Glucose-Capillary: 143 mg/dL — ABNORMAL HIGH (ref 70–99)
Glucose-Capillary: 147 mg/dL — ABNORMAL HIGH (ref 70–99)
Glucose-Capillary: 152 mg/dL — ABNORMAL HIGH (ref 70–99)
Glucose-Capillary: 153 mg/dL — ABNORMAL HIGH (ref 70–99)
Glucose-Capillary: 154 mg/dL — ABNORMAL HIGH (ref 70–99)
Glucose-Capillary: 170 mg/dL — ABNORMAL HIGH (ref 70–99)
Glucose-Capillary: 171 mg/dL — ABNORMAL HIGH (ref 70–99)
Glucose-Capillary: 172 mg/dL — ABNORMAL HIGH (ref 70–99)
Glucose-Capillary: 180 mg/dL — ABNORMAL HIGH (ref 70–99)
Glucose-Capillary: 188 mg/dL — ABNORMAL HIGH (ref 70–99)
Glucose-Capillary: 193 mg/dL — ABNORMAL HIGH (ref 70–99)
Glucose-Capillary: 193 mg/dL — ABNORMAL HIGH (ref 70–99)
Glucose-Capillary: 219 mg/dL — ABNORMAL HIGH (ref 70–99)
Glucose-Capillary: 96 mg/dL (ref 70–99)

## 2022-06-02 LAB — TRIGLYCERIDES: Triglycerides: 163 mg/dL — ABNORMAL HIGH (ref <150)

## 2022-06-02 LAB — MAGNESIUM: Magnesium: 2.5 mg/dL — ABNORMAL HIGH (ref 1.7–2.4)

## 2022-06-02 LAB — CBC
HCT: 30.1 % — ABNORMAL LOW (ref 36.0–46.0)
Hemoglobin: 9.5 g/dL — ABNORMAL LOW (ref 12.0–15.0)
MCH: 24.7 pg — ABNORMAL LOW (ref 26.0–34.0)
MCHC: 31.6 g/dL (ref 30.0–36.0)
MCV: 78.4 fL — ABNORMAL LOW (ref 80.0–100.0)
Platelets: 270 10*3/uL (ref 150–400)
RBC: 3.84 MIL/uL — ABNORMAL LOW (ref 3.87–5.11)
RDW: 18.2 % — ABNORMAL HIGH (ref 11.5–15.5)
WBC: 46.7 10*3/uL — ABNORMAL HIGH (ref 4.0–10.5)
nRBC: 3 % — ABNORMAL HIGH (ref 0.0–0.2)

## 2022-06-02 LAB — HEPATIC FUNCTION PANEL
ALT: 1482 U/L — ABNORMAL HIGH (ref 0–44)
AST: 201 U/L — ABNORMAL HIGH (ref 15–41)
Albumin: 2.4 g/dL — ABNORMAL LOW (ref 3.5–5.0)
Alkaline Phosphatase: 164 U/L — ABNORMAL HIGH (ref 38–126)
Bilirubin, Direct: 0.7 mg/dL — ABNORMAL HIGH (ref 0.0–0.2)
Indirect Bilirubin: 0.6 mg/dL (ref 0.3–0.9)
Total Bilirubin: 1.3 mg/dL — ABNORMAL HIGH (ref 0.3–1.2)
Total Protein: 6 g/dL — ABNORMAL LOW (ref 6.5–8.1)

## 2022-06-02 LAB — HEPARIN LEVEL (UNFRACTIONATED)
Heparin Unfractionated: 0.91 IU/mL — ABNORMAL HIGH (ref 0.30–0.70)
Heparin Unfractionated: 0.99 IU/mL — ABNORMAL HIGH (ref 0.30–0.70)
Heparin Unfractionated: 1.02 IU/mL — ABNORMAL HIGH (ref 0.30–0.70)

## 2022-06-02 MED ORDER — SODIUM CHLORIDE 0.9 % IV SOLN
1.0000 g | Freq: Three times a day (TID) | INTRAVENOUS | Status: DC
  Administered 2022-06-02 – 2022-06-07 (×15): 1 g via INTRAVENOUS
  Filled 2022-06-02 (×16): qty 20

## 2022-06-02 MED ORDER — SODIUM CHLORIDE 0.9 % IV SOLN
40.0000 mg/h | INTRAVENOUS | Status: AC
  Administered 2022-06-02 – 2022-06-03 (×4): 40 mg/h via INTRAVENOUS
  Filled 2022-06-02 (×6): qty 50

## 2022-06-02 MED ORDER — HEPARIN (PORCINE) 25000 UT/250ML-% IV SOLN
1300.0000 [IU]/h | INTRAVENOUS | Status: DC
Start: 2022-06-03 — End: 2022-06-03
  Administered 2022-06-03: 1450 [IU]/h via INTRAVENOUS
  Filled 2022-06-02: qty 250

## 2022-06-02 MED ORDER — PERAMPANEL 8 MG PO TABS
12.0000 mg | ORAL_TABLET | Freq: Once | ORAL | Status: DC
  Filled 2022-06-02 (×2): qty 2

## 2022-06-02 MED ORDER — MIDAZOLAM-SODIUM CHLORIDE 100-0.9 MG/100ML-% IV SOLN
INTRAVENOUS | Status: AC
  Administered 2022-06-02: 40 mg/h
  Filled 2022-06-02: qty 100

## 2022-06-02 MED ORDER — PERAMPANEL 2 MG PO TABS
12.0000 mg | ORAL_TABLET | Freq: Once | ORAL | Status: AC
  Administered 2022-06-02: 12 mg
  Filled 2022-06-02: qty 6

## 2022-06-02 MED ORDER — VALPROATE SODIUM 100 MG/ML IV SOLN
3000.0000 mg | Freq: Once | INTRAVENOUS | Status: AC
  Administered 2022-06-02: 3000 mg via INTRAVENOUS
  Filled 2022-06-02: qty 30

## 2022-06-02 NOTE — Progress Notes (Signed)
MB performed maintenance on FP1, FP2 and CZ electrodes. All impedances are below 10k ohms. No skin breakdown noted.

## 2022-06-02 NOTE — Progress Notes (Signed)
ANTICOAGULATION CONSULT NOTE- follow-up  Pharmacy Consult for heparin Indication: pulmonary embolus  Allergies  Allergen Reactions   Canagliflozin Other (See Comments)    recurrent vaginitis   Glucophage [Metformin] Other (See Comments)    GI intolerance   Niaspan [Niacin] Other (See Comments)    flushing    Patient Measurements: Height: 5' 5.98" (167.6 cm) Weight: 91 kg (200 lb 9.9 oz) IBW/kg (Calculated) : 59.25 Heparin Dosing Weight: 79kg  Vital Signs: Temp: 96.6 F (35.9 C) (08/03 2300) Temp Source: Rectal (08/03 2000) BP: 95/49 (08/03 2300) Pulse Rate: 77 (08/03 2300)  Labs: Recent Labs    05/31/22 0417 05/31/22 0740 06/01/22 0400 06/01/22 1532 06/02/22 0459 06/02/22 0500 06/02/22 1400 06/02/22 1530 06/02/22 2235  HGB 8.1*  --  9.4*  --   --  9.5*  --   --   --   HCT 25.7*  --  29.8*  --   --  30.1*  --   --   --   PLT 167  --  294  --   --  270  --   --   --   HEPARINUNFRC 0.32   < > 0.50  --  0.91*  --  0.99*  --  1.02*  CREATININE 1.53*   < > 1.18* 1.33*  --  1.15*  --  1.07*  --    < > = values in this interval not displayed.     Estimated Creatinine Clearance: 58 mL/min (A) (by C-G formula based on SCr of 1.07 mg/dL (H)).   Assessment: 32 yoF admitted with meningitis from OSH. Pt with ongoing fevers and worsening clinical status 7/29, RV severely down on beside ECHO so concerns for acute PE. Pt received alteplase on 7/30 for suspected saddle PE.  Heparin level remains supratherapeutic and increased slightly at 1.02 after holding drip for 30 minutes and resuming at reduced rate earlier today. CBC stable. No bleeding or issues with infusion per discussion with RN. Appears level was drawn appropriately.  Goal of Therapy:  Heparin level 0.3-0.7 units/mL Monitor platelets by anticoagulation protocol: Yes   Plan:  Hold heparin for 1 hour Resume heparin at reduced rate 1450 units/hr Check 6hr heparin level from resumption Monitor daily CBC, s/sx  bleeding   Arturo Morton, PharmD, BCPS Please check AMION for all [facility] contact numbers Clinical Pharmacist 06/02/2022 11:40 PM

## 2022-06-02 NOTE — Progress Notes (Signed)
ANTICOAGULATION CONSULT NOTE- follow-up  Pharmacy Consult for heparin Indication: pulmonary embolus  Allergies  Allergen Reactions   Canagliflozin Other (See Comments)    recurrent vaginitis   Glucophage [Metformin] Other (See Comments)    GI intolerance   Niaspan [Niacin] Other (See Comments)    flushing    Patient Measurements: Height: 5' 5.98" (167.6 cm) Weight: 91 kg (200 lb 9.9 oz) IBW/kg (Calculated) : 59.25 Heparin Dosing Weight: 94.3kg  Vital Signs: Temp: 97 F (36.1 C) (08/03 0500) Temp Source: Rectal (08/03 0400) BP: 126/56 (08/03 0500) Pulse Rate: 66 (08/03 0500)  Labs: Recent Labs    05/31/22 0417 05/31/22 0740 05/31/22 1456 06/01/22 0400 06/01/22 1532 06/02/22 0459 06/02/22 0500  HGB 8.1*  --   --  9.4*  --   --  9.5*  HCT 25.7*  --   --  29.8*  --   --  30.1*  PLT 167  --   --  294  --   --  270  HEPARINUNFRC 0.32  --  0.46 0.50  --  0.91*  --   CREATININE 1.53*   < > 1.34* 1.18* 1.33*  --  1.15*   < > = values in this interval not displayed.     Estimated Creatinine Clearance: 54 mL/min (A) (by C-G formula based on SCr of 1.15 mg/dL (H)).   Assessment: 27 yoF admitted with meningitis from OSH. Pt with ongoing fevers and worsening clinical status 7/29, RV severely down on beside ECHO so concerns for acute PE. Pt received alteplase on 7/30 for suspected saddle PE.  Heparin level therapeutic at 0.5. CBC stable, no bleeding noted.  8/3 AM update:  Heparin level elevated Hgb stable  Goal of Therapy:  Heparin level 0.3-0.7 units/mL Monitor platelets by anticoagulation protocol: Yes   Plan:  Dec heparin to 1900 units/hr 1400 heparin level  Narda Bonds, PharmD, BCPS Clinical Pharmacist Phone: [phone]

## 2022-06-02 NOTE — Progress Notes (Addendum)
ANTICOAGULATION CONSULT NOTE- follow-up  Pharmacy Consult for heparin Indication: pulmonary embolus  Allergies  Allergen Reactions   Canagliflozin Other (See Comments)    recurrent vaginitis   Glucophage [Metformin] Other (See Comments)    GI intolerance   Niaspan [Niacin] Other (See Comments)    flushing    Patient Measurements: Height: 5' 5.98" (167.6 cm) Weight: 91 kg (200 lb 9.9 oz) IBW/kg (Calculated) : 59.25 Heparin Dosing Weight: 94.3kg  Vital Signs: Temp: 97 F (36.1 C) (08/03 1400) Temp Source: Rectal (08/03 0400) BP: 123/55 (08/03 1400) Pulse Rate: 67 (08/03 1400)  Labs: Recent Labs    05/31/22 0417 05/31/22 0740 06/01/22 0400 06/01/22 1532 06/02/22 0459 06/02/22 0500 06/02/22 1400  HGB 8.1*  --  9.4*  --   --  9.5*  --   HCT 25.7*  --  29.8*  --   --  30.1*  --   PLT 167  --  294  --   --  270  --   HEPARINUNFRC 0.32   < > 0.50  --  0.91*  --  0.99*  CREATININE 1.53*   < > 1.18* 1.33*  --  1.15*  --    < > = values in this interval not displayed.     Estimated Creatinine Clearance: 54 mL/min (A) (by C-G formula based on SCr of 1.15 mg/dL (H)).   Assessment: 20 yoF admitted with meningitis from OSH. Pt with ongoing fevers and worsening clinical status 7/29, RV severely down on beside ECHO so concerns for acute PE. Pt received alteplase on 7/30 for suspected saddle PE.  Heparin level supratherapeutic 0.99. CBC stable, no bleeding noted.  Goal of Therapy:  Heparin level 0.3-0.7 units/mL Monitor platelets by anticoagulation protocol: Yes   Plan:  Hold heparin 30 min Decrease heparin to 1700 units/hr 6-8 hour heparin level  Thank you for allowing pharmacy to participate in this patient's care.  Reatha Harps, PharmD PGY2 Pharmacy Resident 06/02/2022 2:55 PM Check [email or web address] for unit specific pharmacy number

## 2022-06-02 NOTE — Progress Notes (Addendum)
[facility] Infectious Diseases Follow Up Note  Patient Identification: Patient Name: Valerie Cline MRN: [id number] [place] Date: 05/08/2022  9:11 PM Age: 66 y.o.Today's Date: 06/02/2022  Reason for Visit: Possible Viral meningoencephalitis/leukocytosis  Principal Problem:   Meningitis Active Problems:   Severe sepsis ([place])   Acute metabolic encephalopathy   Acute respiratory failure with hypoxia (HCC)   Multifocal pneumonia   Meningioma (HCC)   Cervical myelopathy (HCC)   UTI (urinary tract infection)  Meropenem 7/28-7/31 Vancomycin 7/24-7/26, Vancomycin 7/30-7/31 Ceftriaxone 7/24, 7/27, Ceftriaxone 8/1-c Metronidazole 8/1-c Cefepime 7/25-7/26 Ampicillin 7/24-7/25 Acyclovir 7/24-7/26    Lines/Hardwares: RT IJ non tunneled HD Catheter, Left UE PICC, Left radial art line    Interval Events: Remains on low dose levophed, WBC up to 47K  Assessment #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 However. Entero virus and arbo virus PCR actually pending per conversation today with Micro at [employer]  # Seizures/Status Epilepticus  - on antiepileptic, versed and propofol - EEG 8/1 status epilepticus, left posterior quadrant; Burst suppression, generalized - Neurology following   # Acute Respiratory Failure in the setting of encephalopathy + multolobar PNA 7/25 BAL cx AFB negative, gram stain and cx negative, fungal cx pending  7/28 tracheal aspirate cx normal resp flora  #Small  lymphocytic B cell non-Hodgkin lymphoma # Hypogammoglobulinemia  On maintenance rituximab, last received June, 2023.   Follows Dr. Marin Olp 7/31 IGG/IGA low, IGM WNL, started on IVIG 8/2  #Suspected saddle pulmonary embolism in the setting of sudden onset shock/acute renal and liver failure ( Sock liver), RV dilatation with severe metabolic acidosis Status post tPA with improvement in hemodynamics, on IV heparin On CRRT, nephrology following 7/30 Liver US s/o cirrhosis, Liver enzymes down trending   # Critical Cervical Myelopathy/ACDF C5-C7/Progressive Weakness and Tremors  On solumedrol, Neurology following   Recommendations Will switch ceftriaxone back to meropenem for broader coverage given up trending leukocytosis. Patient is also on steroids Consider CT scan abdomen/pelvis if WBC trends to go up  Monitor CBC, CMP Fu Final BAL cx, Entero virus and arbo virus panel from [facility] Following  Rest of the management as per the primary team. Thank you for the consult. Please page with pertinent questions or concerns. _____________________________________________________________________ Subjective patient seen and examined at the bedside.  Fentanyl, versed, propofol, low dose vasopressin  Heparin and Insulin   Vitals BP (!) 117/57   Pulse 63   Temp (!) 96.8 F (36 C)   Resp (!) 28   Ht 5' 5.98" (1.676 m)   Wt 91 kg   SpO2 98%   BMI 32.40 kg/m   Physical Exam Constitutional: Orotracheally intubated, on CRRT    Comments: EEG leads on head , Generalized edema  Cardiovascular:     Rate and Rhythm: Normal rate and regular rhythm.     Heart sounds:   Pulmonary:     Effort: Pulmonary effort is normal on mechanical ventilation    Comments: Coarse breath sounds  Abdominal:     Palpations: Abdomen is soft.  Tenderness: Nondistended and nontender  Musculoskeletal:        General: No swelling  in peripheral joints.   Skin:    Comments: RT IJ non tunneled HD Catheter,  Left UE PICC, Left radial art line   Neurological:     General: Unresponsive   Pertinent Microbiology Results for orders placed or performed during the hospital encounter of 05/15/2022  MRSA Next Gen by PCR, Nasal     Status: None   Collection Time: 05/22/2022  9:42 PM   Specimen: Nasal Mucosa; Nasal Swab  Result Value Ref Range Status   MRSA by PCR Next Gen NOT DETECTED NOT DETECTED Final    Comment: (NOTE) The GeneXpert MRSA Assay (FDA approved for NASAL specimens only), is one component of a comprehensive MRSA colonization surveillance program. It is not intended to diagnose MRSA infection nor to guide or monitor treatment for MRSA infections. Test performance is not FDA approved in patients less than 31 years old. Performed at [facility], [place] [address], [place], [place] [address]   Urine Culture     Status: None   Collection Time: 05/24/22  2:11 AM   Specimen: Urine  Result Value Ref Range Status   Specimen Description URINE, CLEAN CATCH  Final   Special Requests NONE  Final   Culture   Final    NO GROWTH Performed at [facility], [address] [place], [place] [address]    Report Status 05/25/2022 FINAL  Final  Culture, blood (Routine X 2) w Reflex to ID Panel     Status: None   Collection Time: 05/24/22  6:16 AM   Specimen: BLOOD RIGHT HAND  Result Value Ref Range Status   Specimen Description BLOOD RIGHT HAND  Final   Special Requests   Final    BOTTLES DRAWN AEROBIC AND ANAEROBIC Blood Culture adequate volume   Culture   Final    NO GROWTH 5 DAYS Performed at [facility], [place] [address], [place], [place] [address]    Report Status 05/29/2022 FINAL  Final  Culture, blood (Routine X 2) w Reflex to ID Panel     Status: None   Collection Time: 05/24/22  6:17 AM   Specimen: BLOOD LEFT HAND  Result Value Ref Range Status   Specimen Description BLOOD LEFT HAND  Final   Special Requests   Final    BOTTLES DRAWN AEROBIC AND ANAEROBIC Blood Culture  adequate volume   Culture   Final    NO GROWTH 5 DAYS Performed at [facility], [place] [address], [place], [place] [address]    Report Status 05/29/2022 FINAL  Final  Culture, Respiratory w Gram Stain     Status: None   Collection Time: 05/24/22  9:24 AM   Specimen: Bronchoalveolar Lavage; Respiratory  Result Value Ref Range Status   Specimen Description BRONCHIAL ALVEOLAR LAVAGE  Final   Special Requests Immunocompromised  Final   Gram Stain   Final    RARE WBC PRESENT, PREDOMINANTLY MONONUCLEAR NO ORGANISMS SEEN    Culture   Final    NO GROWTH 2 DAYS Performed at [facility], [address] [place], [place] [address]    Report Status 05/26/2022 FINAL  Final  Acid Fast Smear (AFB)     Status: None   Collection Time: 05/24/22  9:24 AM   Specimen: Bronchial Alveolar Lavage  Result Value Ref Range Status   AFB Specimen Processing Concentration  Final  Acid Fast Smear Negative  Final    Comment: (NOTE) Performed At: [facility] [place], [place] [id number] Rush Farmer MD EQ:[id number]    Source (AFB) BRONCHIAL ALVEOLAR LAVAGE  Final    Comment: Performed at [facility], [place] [address] [place], [place] [address]  Fungus Culture With Stain     Status: None (Preliminary result)   Collection Time: 05/24/22  9:24 AM   Specimen: Bronchial Alveolar Lavage  Result Value Ref Range Status   Fungus Stain Final report  Final    Comment: (NOTE) Performed At: [facility] [place], [place] [id number] Rush Farmer MD [id number]    Fungus (Mycology) Culture PENDING  Incomplete   Fungal Source BRONCHIAL ALVEOLAR LAVAGE  Final    Comment: Performed at [facility], [place] [address], [place], [place] [address]  Respiratory (~20 pathogens) panel by PCR     Status: None   Collection Time: 05/24/22  9:24 AM   Specimen: Bronchial Alveolar Lavage; Respiratory  Result Value Ref Range Status   Adenovirus NOT DETECTED  NOT DETECTED Final   Coronavirus 229E NOT DETECTED NOT DETECTED Final    Comment: (NOTE) The Coronavirus on the Respiratory Panel, DOES NOT test for the novel  Coronavirus (2019 nCoV)    Coronavirus HKU1 NOT DETECTED NOT DETECTED Final   Coronavirus NL63 NOT DETECTED NOT DETECTED Final   Coronavirus OC43 NOT DETECTED NOT DETECTED Final   Metapneumovirus NOT DETECTED NOT DETECTED Final   Rhinovirus / Enterovirus NOT DETECTED NOT DETECTED Final   Influenza A NOT DETECTED NOT DETECTED Final   Influenza B NOT DETECTED NOT DETECTED Final   Parainfluenza Virus 1 NOT DETECTED NOT DETECTED Final   Parainfluenza Virus 2 NOT DETECTED NOT DETECTED Final   Parainfluenza Virus 3 NOT DETECTED NOT DETECTED Final   Parainfluenza Virus 4 NOT DETECTED NOT DETECTED Final   Respiratory Syncytial Virus NOT DETECTED NOT DETECTED Final   Bordetella pertussis NOT DETECTED NOT DETECTED Final   Bordetella Parapertussis NOT DETECTED NOT DETECTED Final   Chlamydophila pneumoniae NOT DETECTED NOT DETECTED Final   Mycoplasma pneumoniae NOT DETECTED NOT DETECTED Final    Comment: Performed at [facility], [place]. [address] [place], [place] [address]  Fungus Culture Result     Status: None   Collection Time: 05/24/22  9:24 AM  Result Value Ref Range Status   Result 1 Comment  Final    Comment: (NOTE) KOH/Calcofluor preparation:  no fungus observed. Performed At: [facility] [place], [place] [id number] Rush Farmer MD IN:[id number]   Culture, Respiratory w Gram Stain     Status: None   Collection Time: 05/27/22  8:31 AM   Specimen: Tracheal Aspirate; Respiratory  Result Value Ref Range Status   Specimen Description TRACHEAL ASPIRATE  Final   Special Requests NONE  Final   Gram Stain   Final    ABUNDANT WBC PRESENT, PREDOMINANTLY PMN FEW GRAM NEGATIVE RODS RARE GRAM POSITIVE COCCI IN PAIRS    Culture   Final    Normal respiratory flora-no Staph aureus or Pseudomonas  seen Performed at [facility], [address] [place], [place] [address]    Report Status 05/29/2022 FINAL  Final  Respiratory (~20 pathogens) panel by PCR     Status: Abnormal   Collection Time: 05/28/22  1:53 PM   Specimen: Nasopharyngeal Swab; Respiratory  Result Value Ref Range Status   Adenovirus NOT DETECTED NOT DETECTED  Final   Coronavirus 229E NOT DETECTED NOT DETECTED Final    Comment: (NOTE) The Coronavirus on the Respiratory Panel, DOES NOT test for the novel  Coronavirus (2019 nCoV)    Coronavirus HKU1 NOT DETECTED NOT DETECTED Final   Coronavirus NL63 NOT DETECTED NOT DETECTED Final   Coronavirus OC43 NOT DETECTED NOT DETECTED Final   Metapneumovirus NOT DETECTED NOT DETECTED Final   Rhinovirus / Enterovirus DETECTED (A) NOT DETECTED Final   Influenza A NOT DETECTED NOT DETECTED Final   Influenza B NOT DETECTED NOT DETECTED Final   Parainfluenza Virus 1 NOT DETECTED NOT DETECTED Final   Parainfluenza Virus 2 NOT DETECTED NOT DETECTED Final   Parainfluenza Virus 3 NOT DETECTED NOT DETECTED Final   Parainfluenza Virus 4 NOT DETECTED NOT DETECTED Final   Respiratory Syncytial Virus NOT DETECTED NOT DETECTED Final   Bordetella pertussis NOT DETECTED NOT DETECTED Final   Bordetella Parapertussis NOT DETECTED NOT DETECTED Final   Chlamydophila pneumoniae NOT DETECTED NOT DETECTED Final   Mycoplasma pneumoniae NOT DETECTED NOT DETECTED Final    Comment: Performed at [facility], [address], [place], [place] [address]  Culture, blood (Routine X 2) w Reflex to ID Panel     Status: None (Preliminary result)   Collection Time: 05/29/22  8:58 AM   Specimen: BLOOD RIGHT HAND  Result Value Ref Range Status   Specimen Description BLOOD RIGHT HAND  Final   Special Requests   Final    BOTTLES DRAWN AEROBIC AND ANAEROBIC Blood Culture adequate volume   Culture   Final    NO GROWTH 3 DAYS Performed at [facility], [place] [address] [place], [place]  [address]    Report Status PENDING  Incomplete  Culture, blood (Routine X 2) w Reflex to ID Panel     Status: None (Preliminary result)   Collection Time: 05/29/22  8:58 AM   Specimen: BLOOD RIGHT HAND  Result Value Ref Range Status   Specimen Description BLOOD RIGHT HAND  Final   Special Requests IN PEDIATRIC BOTTLE Blood Culture adequate volume  Final   Culture   Final    NO GROWTH 3 DAYS Performed at [facility], [place] [address] [place], [place] [address]    Report Status PENDING  Incomplete   Pertinent Lab.    Latest Ref Rng & Units 06/02/2022    5:00 AM 06/01/2022    4:00 AM 05/31/2022    4:17 AM  CBC  WBC 4.0 - 10.5 K/uL 46.7  29.5  12.9   Hemoglobin 12.0 - 15.0 g/dL 9.5  9.4  8.1   Hematocrit 36.0 - 46.0 % 30.1  29.8  25.7   Platelets 150 - 400 K/uL 270  294  167       Latest Ref Rng & Units 06/02/2022    5:00 AM 06/01/2022    3:32 PM 06/01/2022    4:00 AM  CMP  Glucose 70 - 99 mg/dL 186  256  427   BUN 8 - 23 mg/dL 40  43  46   Creatinine 0.44 - 1.00 mg/dL 1.15  1.33  1.18   Sodium 135 - 145 mmol/L 135  134  137   Potassium 3.5 - 5.1 mmol/L 4.5  4.2  4.5   Chloride 98 - 111 mmol/L 102  103  100   CO2 22 - 32 mmol/L _0 Calcium 8.9 - 10.3 mg/dL 7.6  7.4  7.8  Pertinent Imaging today Plain films and CT images have been personally visualized and interpreted; radiology reports have been reviewed. Decision making incorporated into the Impression / Recommendations.  CT HEAD WO CONTRAST (5MM)  Result Date: 06/01/2022 CLINICAL DATA:  Meningitis/CNS infection suspected EXAM: CT HEAD WITHOUT CONTRAST TECHNIQUE: Contiguous axial images were obtained from the base of the skull through the vertex without intravenous contrast. RADIATION DOSE REDUCTION: This exam was performed according to the departmental dose-optimization program which includes automated exposure control, adjustment of the mA and/or kV according to patient size and/or use of iterative reconstruction  technique. COMPARISON:  05/29/2022 CT head, 05/25/2022 MRI, CT head 05/20/2022 FINDINGS: Brain: Again noted is poor visualization of the sulci in the parietal and occipital lobes. No evidence of acute infarction, hemorrhage, mass, mass effect, or midline shift. No hydrocephalus or definite extra-axial fluid collection. Vascular: No hyperdense vessel. Atherosclerotic calcifications in the intracranial carotid and vertebral arteries. Skull: Normal. Negative for fracture or focal lesion. Sinuses/Orbits: No acute finding. Other: Fluid in the right-greater-than-left left mastoid air cells. IMPRESSION: Poor visualization of the parietal and occipital sulci, similar to 05/29/2022, which is consistent with the abnormal CSF signal and suspected meningitis seen on the 05/25/2022 MRI, although no discrete extra-axial collection is seen. Electronically Signed   By: Merilyn Baba M.D.   On: 06/01/2022 13:12     I spent 52  minutes for this patient encounter including review of prior medical records, coordination of care with primary/other specialist with greater than 50% of time being face to face/counseling and discussing diagnostics/treatment plan with the patient/family.  Electronically signed by:   Rosiland Oz, MD Infectious Disease Physician [facility] Infectious [facility] Pager: [phone]

## 2022-06-02 NOTE — Procedures (Signed)
Patient Name: [name]  MRN: [id number]  Epilepsy Attending: Lora Havens  Referring Physician/Provider: Greta Doom, MD   Duration: 06/01/2022 1014 to 06/02/2022 1014   Patient history: 66yo F with ams. EEG to evaluate for seizure   Level of alertness: lethargic, asleep   AEDs during EEG study: propofol, versed, Keppra, Vimpat   Technical aspects: This EEG study was done with scalp electrodes positioned according to the 10-20 International system of electrode placement. Electrical activity was acquired at a sampling rate of '500Hz'$  and reviewed with a high frequency filter of '70Hz'$  and a low frequency filter of '1Hz'$ . EEG data were recorded continuously and digitally stored.    Description: EEG showed burst suppression pattern with bursts of generalized polymorphic sharply contoured 3 to 7 Hz theta-delta slowing lasting 2-5 seconds alternating with about 30 seconds of EEG suppression.    ABNORMALITY -Burst suppression, generalized   IMPRESSION: This study is suggestive of profound diffuse encephalopathy likely due to sedation.  No definite seizures were seen during the study.   Laiden Milles [name]

## 2022-06-02 NOTE — Progress Notes (Signed)
NAME:  Valerie Cline, MRN:  [id number], DOB:  1956/07/08, LOS: 51 ADMISSION DATE:  05/10/2022, CONSULTATION DATE:  05/24/22 REFERRING MD:  Shela Leff, MD CHIEF COMPLAINT:  Altered mental status, suspected meningitis   History of Present Illness:  66 year old female with small cell lymphocytic B cell non-Hodgkin's lymphoma on Rituxan since 2020 (last dose in June 2023), HTN, HLD, DM, CAD, carotid stenosis, recent hand-foot-and mouth disease transferred from [place] to [facility].   Discharge summary reviewed: She initially presented to OSH on 05/20/22 for left upper extremity tremors. She reported recent hand foot any mouth disease contracted from her grandchild that left her weak and tired prior to this admission. Neurology work-up including MRI, EEG negative. MRI/MRA neg for acute infarct, 13 mm focus in left frontal lobe representing artifact vs small calcified meningioma, chronic small vessel ischemic changes, cerebral atrophy, high-grade stenosis in bilateral ICA, focal stenosis in left ACA and intracranial right vertebral artery. MRI of C-spine showing C4-5 cord compression from advanced disc degeneration with posterior endplate ridging.  Showing left foraminal impingement at the same level.  Also showing C5-6 and C6-7 ADCF with solid arthrodesis.  Neurologist at [facility] had recommended neurosurgical evaluation.  She developed fevers and transferred to ICU concerning for meningitis and started on Vanc, rocephin, ampicillin and acyclovir. LP was performed on 7/24 with WBC 55 RBC 2. Pending herpes, enterovirus, ehrlichia. CT A/P showed bilateral ground glass and lung opacities concerning for multifocal pneumonia.   PCCM consulted for decreased responsiveness and hypoxemia on NRB. On exam obtunded and only able to say name. O2 sats in the 70s on NRB. Transferred to the ICU for emergent intubation   Pertinent  Medical History  As above  Significant Hospital Events: Including procedures,  antibiotic start and stop dates in addition to other pertinent events   7/24 Admitted to [facility] from [place]. Transferred to PCCM when found in respiratory failure 7/25 intubated, bronched, art line placed, PICC placed.  Neuro and ID consulted. Started decadron  7/26 decadron dc/d 7/27 felt most consistent with viral meningoencephalitis  7/29-7/30 sudden onset multiorgan failure  Interim History / Subjective:  Afebrile Remains on low-dose Levophed Was started on Versed yesterday along with propofol to keep her in burst suppression Her white count keep going up now is 47 K  Objective   Blood pressure (!) 106/51, pulse 65, temperature (!) 97.5 F (36.4 C), resp. rate (!) 28, height 5' 5.98" (1.676 m), weight 91 kg, SpO2 97 %. CVP:  [4 mmHg-24 mmHg] 6 mmHg  Vent Mode: PRVC FiO2 (%):  [40 %] 40 % Set Rate:  [28 bmp] 28 bmp Vt Set:  [480 mL] 480 mL PEEP:  [5 cmH20-8 cmH20] 8 cmH20 Plateau Pressure:  [21 cmH20-22 cmH20] 21 cmH20   Intake/Output Summary (Last 24 hours) at 06/02/2022 0915 Last data filed at 06/02/2022 0800 Gross per 24 hour  Intake 5679.14 ml  Output 6918 ml  Net -1238.86 ml   Filed Weights   05/30/22 0500 05/31/22 0309 06/01/22 0439  Weight: 94.3 kg 91.7 kg 91 kg   Physical Exam: Physical exam: General: Crtitically ill-appearing female, orally intubated HEENT: [place]/AT, eyes anicteric.  ETT and OGT in place Neuro: Sedated, not following commands.  Eyes are closed.  Pupils 3 mm bilateral none reactive to light, off and gag, no movement to painful stimuli Chest: Coarse breath sounds, no wheezes or rhonchi Heart: Regular rate and rhythm, no murmurs or gallops Abdomen: Soft, nontender, nondistended, bowel sounds present Skin:  No rash  Resolved Hospital Problem list   Metabolic acidosis  Assessment & Plan:  Acute infectious encephalopathy in setting of viral meningoencephalitis  Unfortunately patient needs deep sedation to control her status epilepticus, currently RASS is  -5 while we are trying to achieve burst suppression   Acute hypoxemic/hypercapnic respiratory failure 2/2 multilobar PNA  Trach aspirate is growing oral flora, follow-up culture until its speciate Appreciate infectious disease follow-up After switching antibiotics, patient's white count keeps going up, now is 23 K Had discussion with infectious disease, antibiotics will be switched back to meropenem Continue lung protective ventilation Not ready for SBT due to deep sedation and status epilepticus  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  [facility] OSH neg, culture neg, arbovirus panel penidng. Critical cervical myopathy - question baseline severe DJD with meningeal swelling threatening critical stenosis cont droplet  Appreciate ID and neuro following  Continue IV steroid, defer to neurology for tapering Arboviral panel from [facility] is negative now F/u enterovirus PCR is negative  Hypogammaglobinemia Patient is on Rituxan as an outpatient Her immunoglobulin levels were low She was given IVIG yesterday  Status epilepticus Hypertriglyceridemia in the setting of propofol infusion Status epilepticus have improved but she still have periodic discharges EEG read is pending today Continue Keppra and Vimpat Currently on propofol at 60 mics per KG per hour and Versed at 40 mg/h Remain on continuous EEG Appreciate neurology follow-up  UTI Completed treatment  DM2 with severe hyperglycemia  She has labile blood sugars, currently continue with insulin infusion, once stabilized we will try to switch her to long-acting  Small lymphocytic B cell non-hodgkin lymphoma On maintenance rituximab (last received April 05 2022)  Follows Dr. Marin Olp Oncology is following Immunoglobulins levels are are low, getting replacement  Sudden onset shock, acute renal failure, acute liver failure, RV dilation, large dead  space, severe metabolic acidosis Suspected saddle pulmonary embolism Patient received tPA, with improvement in hemodynamics Continue IV heparin Remain on on CRRT, will speak with nephrology stop CRRT today Nephrology is following Repeat LFTs are pending Closely monitor electrolytes and bicarbonate  Best Practice (right click and "Reselect all SmartList Selections" daily)   Diet/type: Tube feeds DVT prophylaxis: heparin gtt GI prophylaxis: PPI Lines: PICC  Foley:  Yes, and it is still needed  Code Status:  full code Last date of multidisciplinary goals of care discussion 8/3- Family updated at bedside, please see iPal note  Total critical care time: 38 minutes  Performed by: Jacky Kindle   Critical care time was exclusive of separately billable procedures and treating other patients.   Critical care was necessary to treat or prevent imminent or life-threatening deterioration.   Critical care was time spent personally by me on the following activities: development of treatment plan with patient and/or surrogate as well as nursing, discussions with consultants, evaluation of patient's response to treatment, examination of patient, obtaining history from patient or surrogate, ordering and performing treatments and interventions, ordering and review of laboratory studies, ordering and review of radiographic studies, pulse oximetry and re-evaluation of patient's condition.   Jacky Kindle, MD [facility] See Amion for pager If no response to pager, please call [phone] until 7pm After 7pm, Please call E-link [phone]

## 2022-06-02 NOTE — Progress Notes (Addendum)
Subjective: No further seizures overnight.  ROS: Unable to obtain due to poor mental status  Examination  Vital signs in last 24 hours: Temp:  [96.6 F (35.9 C)-99.1 F (37.3 C)] 97.5 F (36.4 C) (08/03 1200) Pulse Rate:  [63-91] 70 (08/03 1200) Resp:  [19-28] 28 (08/03 1200) BP: (99-159)/(48-59) 111/52 (08/03 1200) SpO2:  [93 %-98 %] 96 % (08/03 1200) Arterial Line BP: (114-214)/(43-204) 156/51 (08/03 1200) FiO2 (%):  [40 %] 40 % (08/03 1146)  General: lying in bed, NAD Neuro: On propofol and versed, comatose, pupils about 3 mm not reacting to light, corneal reflex absent, gag reflex absent, does not withdraw to noxious stimuli in all extremities    Basic Metabolic Panel: Recent Labs  Lab 05/29/22 0229 05/29/22 0546 05/30/22 0455 05/30/22 1600 05/31/22 0417 05/31/22 0740 05/31/22 1456 06/01/22 0400 06/01/22 1532 06/02/22 0500  NA 139   < > 143   < > 138 138 138 137 134* 135  K 6.8*   < > 4.3   < > 4.2 4.3 4.3 4.5 4.2 4.5  CL 100   < > 102   < > 102 103 103 100 103 102  CO2 30   < > 27   < > '26 26 26 24 23 23  '$ GLUCOSE 484*   < > 166*   < > 320* 346* 378* 427* 256* 186*  BUN 56*   < > 48*   < > 46* 46* 47* 46* 43* 40*  CREATININE 2.22*   < > 2.03*   < > 1.53* 1.58* 1.34* 1.18* 1.33* 1.15*  CALCIUM 7.7*   < > 7.6*   < > 7.6* 7.5* 7.7* 7.8* 7.4* 7.6*  MG 2.4  --  2.3  --  2.6*  --   --  2.9*  --  2.5*  PHOS 8.5*   < > 5.1*   < > 3.7  --  4.3 3.7 3.9 3.7   < > = values in this interval not displayed.    CBC: Recent Labs  Lab 05/29/22 1226 05/30/22 0455 05/31/22 0417 06/01/22 0400 06/02/22 0500  WBC 20.2* 9.1 12.9* 29.5* 46.7*  HGB 10.0* 8.4* 8.1* 9.4* 9.5*  HCT 34.7* 27.2* 25.7* 29.8* 30.1*  MCV 83.8 79.3* 78.1* 79.5* 78.4*  PLT 260 148* 167 294 270     Coagulation Studies: No results for input(s): "LABPROT", "INR" in the last 72 hours.  Imaging CT head without contrast 06/01/2022: Poor visualization of the parietal and occipital sulci, similar to  05/29/2022, which is consistent with the abnormal CSF signal and suspected meningitis seen on the 05/25/2022 MRI, although no discrete extra-axial collection is seen.  ASSESSMENT AND PLAN: 66 year old female with suspected viral meningo-encephalitis (had hand-foot-and-mouth disease prior to presentation) now with status epilepticus.   Focal convulsive status epilepticus, resolved Provoked seizures Acute encephalopathy, infectious Viral meningo-encephalitis Left meningioma Hypertriglyceridemia -Status epilepticus likely due to viral encephalitis, meningioma (meningiomas do not commonly cause status epilepticus but the location of this meningioma is concerning).     Recommendations -Reduce propofol by 10 mcg/h to stop -Continue Versed to 40 mL/h.  Will likely wean tomorrow -Load with perampanel 12 mg once -Continue Keppra 1 g twice daily (renally dosed), Vimpat 200 mg twice daily -Continue LTM EEG while on sedation -Discussed plan with patient's husband at bedside -Continue seizure precautions  ADDENDUM -Frequent spikes in left posterior quadrant after propofol weaned off.  Will give one-time dose of Depakote 3000 mg   CRITICAL CARE Performed by:  Leib Elahi [name]    Total critical care time: 38 minutes   Critical care time was exclusive of separately billable procedures and treating other patients.   Critical care was necessary to treat or prevent imminent or life-threatening deterioration.   Critical care was time spent personally by me on the following activities: development of treatment plan with patient and/or surrogate as well as nursing, discussions with consultants, evaluation of patient's response to treatment, examination of patient, obtaining history from patient or surrogate, ordering and performing treatments and interventions, ordering and review of laboratory studies, ordering and review of radiographic studies, pulse oximetry and re-evaluation of patient's condition.       Zeb Comfort Epilepsy [facility] For questions after 5pm please refer to AMION to reach the Neurologist on call

## 2022-06-02 NOTE — Progress Notes (Signed)
[facility] Progress Note    66 y.o. year-old w/ hx HTN, hL, DM, CAD, carotid stenosis and hx NHL last chemoRx June 2023 presented to OSH on 7/21 for LUE tremors w/ recent hand-foot-and mouth disease. Work-up showed neg MRI of brain but MRI of C-spine showed sig cord compression at C4-5. Decompensated w/ high givers and AMS, concerning for meningitis and was moved to ICU. LP showed wbc 55 and CT showed multifocal PNA. Pt transferred to [facility] on 7/24. On 7/25 pt become poorly responsiveness and was intubated and moved to ICU. Fevers continued and pt developed hypotension/ shock on 7/29 and started on vaso and levo gtts.  Creat 0.8 on admit and cont to rise with hyperkalemia as well -> CRRT.    Assessment/ Plan:   AKI - b/l creat 0.8 on admission. Creat rising now in setting of new onset shock. Pt admitted for viral meningoencephalitis complicated by critical cervical myopathy and now resp failure w/ multifocal PNA. Pt has lymphoma receiving active chemoRx (rituximab) w/ last dose in June 2023. AKI due to shock which started in last 24 hrs abruptly. CCM suspecting acute PE, s/p TPA and now on IV heparin. K+ up to 6.8 -> CCRT 7/30 w/ all 2K fluids -> 4K now. No signs of renal recovery. Very minimal UOP.  Seen on CRRT On 4K fluids  500/400/1500 pre/post/qd Now back on low dose Levophed 11mg (primarily bec of the heavy sedation) and tolerating 100 ml/hr; able to get her net neg 993/24hr.  Updated husband who was bedside.  Volume - mod edema on exam, I/O + 9.9  L since admit. Tolerating 579mhr and will try 10057mr. Shock - maybe from multilobar PNA + viral menigoencephalitis, as above, on vasopressor support,  H/o lymphoma - getting active chemoRx w/ last dose rituxan in June 2023 AHRF - on vent, multifocal PNA on broad spec IV abx per CCM Status epilepticus on propofol AMS - in setting of viral meningoencephalitis  Cervical myelopathy  DM2 - on insulin  Subjective:   Spouse  bedside; tolerating UF now that she's back on Levophed (low dose).   Objective:   BP (!) 99/48   Pulse 72   Temp 98.2 F (36.8 C)   Resp (!) 28   Ht 5' 5.98" (1.676 m)   Wt 91 kg   SpO2 97%   BMI 32.40 kg/m   Intake/Output Summary (Last 24 hours) at 06/02/2022 1031 Last data filed at 06/02/2022 1000 Gross per 24 hour  Intake 5827.63 ml  Output 7312 ml  Net -1484.37 ml   Weight change:   Physical Exam: Gen on vent, sedated No rash, cyanosis or gangrene Sclera anicteric, throat w/ ETT No jvd or bruits Chest clear anterior/ latera RRR no RG Abd soft ntnd no mass or ascites +bs GU foley draining clear light amber urine Ext 1+ bilat UE > LE edema, no wounds or ulcers Neuro is on vent, sedated  Imaging: CT HEAD WO CONTRAST (5MM)  Result Date: 06/01/2022 CLINICAL DATA:  Meningitis/CNS infection suspected EXAM: CT HEAD WITHOUT CONTRAST TECHNIQUE: Contiguous axial images were obtained from the base of the skull through the vertex without intravenous contrast. RADIATION DOSE REDUCTION: This exam was performed according to the departmental dose-optimization program which includes automated exposure control, adjustment of the mA and/or kV according to patient size and/or use of iterative reconstruction technique. COMPARISON:  05/29/2022 CT head, 05/25/2022 MRI, CT head 05/20/2022 FINDINGS: Brain: Again noted is poor visualization of the sulci in the parietal  and occipital lobes. No evidence of acute infarction, hemorrhage, mass, mass effect, or midline shift. No hydrocephalus or definite extra-axial fluid collection. Vascular: No hyperdense vessel. Atherosclerotic calcifications in the intracranial carotid and vertebral arteries. Skull: Normal. Negative for fracture or focal lesion. Sinuses/Orbits: No acute finding. Other: Fluid in the right-greater-than-left left mastoid air cells. IMPRESSION: Poor visualization of the parietal and occipital sulci, similar to 05/29/2022, which is consistent  with the abnormal CSF signal and suspected meningitis seen on the 05/25/2022 MRI, although no discrete extra-axial collection is seen. Electronically Signed   By: Merilyn Baba M.D.   On: 06/01/2022 13:12    Labs: BMET Recent Labs  Lab 05/30/22 0455 05/30/22 1600 05/31/22 0417 05/31/22 0740 05/31/22 1456 06/01/22 0400 06/01/22 1532 06/02/22 0500  NA 143 140 138 138 138 137 134* 135  K 4.3 4.5 4.2 4.3 4.3 4.5 4.2 4.5  CL 102 100 102 103 103 100 103 102  CO2 '27 26 26 26 26 24 23 23  '$ GLUCOSE 166* 220* 320* 346* 378* 427* 256* 186*  BUN 48* 44* 46* 46* 47* 46* 43* 40*  CREATININE 2.03* 1.77* 1.53* 1.58* 1.34* 1.18* 1.33* 1.15*  CALCIUM 7.6* 7.6* 7.6* 7.5* 7.7* 7.8* 7.4* 7.6*  PHOS 5.1* 4.2 3.7  --  4.3 3.7 3.9 3.7   CBC Recent Labs  Lab 05/30/22 0455 05/31/22 0417 06/01/22 0400 06/02/22 0500  WBC 9.1 12.9* 29.5* 46.7*  HGB 8.4* 8.1* 9.4* 9.5*  HCT 27.2* 25.7* 29.8* 30.1*  MCV 79.3* 78.1* 79.5* 78.4*  PLT 148* 167 294 270    Medications:     Chlorhexidine Gluconate Cloth  6 each Topical Q0600   Chlorhexidine Gluconate Cloth  6 each Topical Daily   docusate  100 mg Per Tube BID   docusate  100 mg Per Tube BID   ipratropium-albuterol  3 mL Nebulization Q4H   methylPREDNISolone (SOLU-MEDROL) injection  125 mg Intravenous Q12H   multivitamin  1 tablet Per Tube QHS   mouth rinse  15 mL Mouth Rinse Q2H   pantoprazole sodium  40 mg Per Tube Daily   perampanel  12 mg Per Tube Once   polyethylene glycol  17 g Per Tube Daily   sodium chloride flush  10-40 mL Intracatheter Q12H   sodium chloride flush  10-40 mL Intracatheter Q12H      Otelia Santee, MD 06/02/2022, 10:31 AM

## 2022-06-02 NOTE — IPAL (Signed)
Interdisciplinary Goals of Care Family Meeting   Date carried out:: 06/02/2022  Location of the meeting: Bedside  Member's involved: Physician, Bedside Registered Nurse, and Family Member or next of kin  Durable Power of Attorney or acting medical decision maker: Ray Crafton/Jill Parker  Discussion: We discussed goals of care for Valerie Cline .    The Clinical status was relayed to patient's husband and daughter at bedside in detail.   Updated and notified of patients medical condition.     Patient remains unresponsive and will not open eyes to command.   Evidence of kidney failure, continuous seizures and meningeal encephalitis   Patient with multiorgan failure with a very high chance of clinical deterioration despite all aggressive and optimal medical therapy. Patient's family would like to continue full scope of care including pentobarbital if her seizures are not controlled and even including tracheostomy  Code status: Full Code  Disposition: Continue current acute care    Family are satisfied with Plan of action and management. All questions answered   Jacky Kindle MD [facility] See Amion for pager If no response to pager, please call [phone] until 7pm After 7pm, Please call E-link [phone]

## 2022-06-02 NOTE — Progress Notes (Signed)
Pharmacy Antibiotic Note  Valerie Cline is a 66 y.o. female admitted on 05/17/2022 with encephalitis, concern for PNA. Pharmacy has been consulted for Meropenem dosing.  Noted AKI on CRRT - tolerating for the most part, noted 2 hours of charted off time yesterday, will continue to monitor. Will dose Meropenem for CVVHDF.  Plan: - Meropenem 1g IV every 8 hours - Will continue to follow CRRT tolerance, off times, and transitions to other dialysis modalities (i.e, IHD) for any necessary dose adjustments  Height: 5' 5.98" (167.6 cm) Weight: 91 kg (200 lb 9.9 oz) IBW/kg (Calculated) : 59.25  Temp (24hrs), Avg:98 F (36.7 C), Min:96.6 F (35.9 C), Max:99.1 F (37.3 C)  Recent Labs  Lab 05/29/22 0546 05/29/22 1226 05/29/22 1801 05/30/22 0455 05/30/22 1600 05/31/22 0417 05/31/22 0740 05/31/22 1456 06/01/22 0400 06/01/22 1532 06/02/22 0500  WBC  --  20.2*  --  9.1  --  12.9*  --   --  29.5*  --  46.7*  CREATININE 2.18* 2.87*   < > 2.03*   < > 1.53* 1.58* 1.34* 1.18* 1.33* 1.15*  LATICACIDVEN 2.5* 5.7*  --   --   --   --   --   --   --   --   --    < > = values in this interval not displayed.    Estimated Creatinine Clearance: 54 mL/min (A) (by C-G formula based on SCr of 1.15 mg/dL (H)).    Allergies  Allergen Reactions   Canagliflozin Other (See Comments)    recurrent vaginitis   Glucophage [Metformin] Other (See Comments)    GI intolerance   Niaspan [Niacin] Other (See Comments)    flushing    Antimicrobials this admission: Acyclovir 7/25 >> 7/27 Ampicillin 7/25 x 1 Cefepime 7/25 >> 7/27 CTX 7/25 x 1; restart 8/1 >> 8/2 Vancomycin 7/25 >> 7/26; restart 7/30 >> 7/31 Meropenem 7/28 >> 8/1; restart 8/3 >>   Dose adjustments this admission: N/a  Microbiology results: 7/24 MRSA PCR >> neg 7/25 RVP >> neg 7/25 UCx >> NG 7/25 Resp AFB smear >> neg 7/25 BAL >> NGf 7/25 BCx >> NGf 7/28 Trach asp >> NOF 7/29 RVP >> rhinovirus/enterovirus + 7/30 BCx >>  [id number]  Thank you for allowing pharmacy to be a part of this patient's care.  Alycia Rossetti, PharmD, BCPS Infectious Diseases Clinical Pharmacist 06/02/2022 10:36 AM   **Pharmacist phone directory can now be found on [email or web address] ([email or web address]).  Listed under [place].

## 2022-06-03 ENCOUNTER — Inpatient Hospital Stay (HOSPITAL_COMMUNITY): Payer: Medicare Other

## 2022-06-03 DIAGNOSIS — G039 Meningitis, unspecified: Secondary | ICD-10-CM | POA: Diagnosis not present

## 2022-06-03 DIAGNOSIS — J9601 Acute respiratory failure with hypoxia: Secondary | ICD-10-CM | POA: Diagnosis not present

## 2022-06-03 DIAGNOSIS — G40901 Epilepsy, unspecified, not intractable, with status epilepticus: Secondary | ICD-10-CM | POA: Diagnosis not present

## 2022-06-03 DIAGNOSIS — R4182 Altered mental status, unspecified: Secondary | ICD-10-CM | POA: Diagnosis not present

## 2022-06-03 DIAGNOSIS — L8990 Pressure ulcer of unspecified site, unspecified stage: Secondary | ICD-10-CM | POA: Insufficient documentation

## 2022-06-03 LAB — HEPARIN LEVEL (UNFRACTIONATED): Heparin Unfractionated: 0.7 IU/mL (ref 0.30–0.70)

## 2022-06-03 LAB — RENAL FUNCTION PANEL
Albumin: 2.5 g/dL — ABNORMAL LOW (ref 3.5–5.0)
Albumin: 2.4 g/dL — ABNORMAL LOW (ref 3.5–5.0)
Anion gap: 9 (ref 5–15)
Anion gap: 8 (ref 5–15)
BUN: 40 mg/dL — ABNORMAL HIGH (ref 8–23)
BUN: 40 mg/dL — ABNORMAL HIGH (ref 8–23)
CO2: 23 mmol/L (ref 22–32)
CO2: 25 mmol/L (ref 22–32)
Calcium: 7.9 mg/dL — ABNORMAL LOW (ref 8.9–10.3)
Calcium: 8 mg/dL — ABNORMAL LOW (ref 8.9–10.3)
Chloride: 103 mmol/L (ref 98–111)
Chloride: 103 mmol/L (ref 98–111)
Creatinine, Ser: 1.12 mg/dL — ABNORMAL HIGH (ref 0.44–1.00)
Creatinine, Ser: 1.05 mg/dL — ABNORMAL HIGH (ref 0.44–1.00)
GFR, Estimated: 54 mL/min — ABNORMAL LOW (ref >60)
GFR, Estimated: 58 mL/min — ABNORMAL LOW (ref >60)
Glucose, Bld: 193 mg/dL — ABNORMAL HIGH (ref 70–99)
Glucose, Bld: 240 mg/dL — ABNORMAL HIGH (ref 70–99)
Phosphorus: 4.2 mg/dL (ref 2.5–4.6)
Phosphorus: 4.1 mg/dL (ref 2.5–4.6)
Potassium: 5.3 mmol/L — ABNORMAL HIGH (ref 3.5–5.1)
Potassium: 5.4 mmol/L — ABNORMAL HIGH (ref 3.5–5.1)
Sodium: 135 mmol/L (ref 135–145)
Sodium: 136 mmol/L (ref 135–145)

## 2022-06-03 LAB — GLUCOSE, CAPILLARY
Glucose-Capillary: 119 mg/dL — ABNORMAL HIGH (ref 70–99)
Glucose-Capillary: 141 mg/dL — ABNORMAL HIGH (ref 70–99)
Glucose-Capillary: 155 mg/dL — ABNORMAL HIGH (ref 70–99)
Glucose-Capillary: 155 mg/dL — ABNORMAL HIGH (ref 70–99)
Glucose-Capillary: 158 mg/dL — ABNORMAL HIGH (ref 70–99)
Glucose-Capillary: 169 mg/dL — ABNORMAL HIGH (ref 70–99)
Glucose-Capillary: 169 mg/dL — ABNORMAL HIGH (ref 70–99)
Glucose-Capillary: 170 mg/dL — ABNORMAL HIGH (ref 70–99)
Glucose-Capillary: 171 mg/dL — ABNORMAL HIGH (ref 70–99)
Glucose-Capillary: 172 mg/dL — ABNORMAL HIGH (ref 70–99)
Glucose-Capillary: 173 mg/dL — ABNORMAL HIGH (ref 70–99)
Glucose-Capillary: 176 mg/dL — ABNORMAL HIGH (ref 70–99)
Glucose-Capillary: 182 mg/dL — ABNORMAL HIGH (ref 70–99)
Glucose-Capillary: 185 mg/dL — ABNORMAL HIGH (ref 70–99)
Glucose-Capillary: 190 mg/dL — ABNORMAL HIGH (ref 70–99)
Glucose-Capillary: 191 mg/dL — ABNORMAL HIGH (ref 70–99)
Glucose-Capillary: 205 mg/dL — ABNORMAL HIGH (ref 70–99)
Glucose-Capillary: 209 mg/dL — ABNORMAL HIGH (ref 70–99)

## 2022-06-03 LAB — CBC WITH DIFFERENTIAL/PLATELET
Abs Immature Granulocytes: 1.2 10*3/uL — ABNORMAL HIGH (ref 0.00–0.07)
Basophils Absolute: 0 10*3/uL (ref 0.0–0.1)
Basophils Relative: 0 %
Eosinophils Absolute: 0 10*3/uL (ref 0.0–0.5)
Eosinophils Relative: 0 %
HCT: 31.4 % — ABNORMAL LOW (ref 36.0–46.0)
Hemoglobin: 9.9 g/dL — ABNORMAL LOW (ref 12.0–15.0)
Lymphocytes Relative: 0 %
Lymphs Abs: 0 10*3/uL — ABNORMAL LOW (ref 0.7–4.0)
MCH: 24.8 pg — ABNORMAL LOW (ref 26.0–34.0)
MCHC: 31.5 g/dL (ref 30.0–36.0)
MCV: 78.7 fL — ABNORMAL LOW (ref 80.0–100.0)
Metamyelocytes Relative: 1 %
Monocytes Absolute: 2.5 10*3/uL — ABNORMAL HIGH (ref 0.1–1.0)
Monocytes Relative: 4 %
Myelocytes: 1 %
Neutro Abs: 57.6 10*3/uL — ABNORMAL HIGH (ref 1.7–7.7)
Neutrophils Relative %: 94 %
Platelets: 324 10*3/uL (ref 150–400)
RBC: 3.99 MIL/uL (ref 3.87–5.11)
RDW: 18.6 % — ABNORMAL HIGH (ref 11.5–15.5)
WBC: 61.3 10*3/uL (ref 4.0–10.5)
nRBC: 3 % — ABNORMAL HIGH (ref 0.0–0.2)
nRBC: 3 /100 WBC — ABNORMAL HIGH

## 2022-06-03 LAB — CULTURE, BLOOD (ROUTINE X 2)
Culture: NO GROWTH
Culture: NO GROWTH
Special Requests: ADEQUATE
Special Requests: ADEQUATE

## 2022-06-03 LAB — MAGNESIUM: Magnesium: 2.6 mg/dL — ABNORMAL HIGH (ref 1.7–2.4)

## 2022-06-03 LAB — TRIGLYCERIDES: Triglycerides: 137 mg/dL (ref <150)

## 2022-06-03 LAB — CRYPTOCOCCAL ANTIGEN: Crypto Ag: NEGATIVE

## 2022-06-03 MED ORDER — ROCURONIUM BROMIDE 10 MG/ML (PF) SYRINGE
PREFILLED_SYRINGE | INTRAVENOUS | Status: AC
  Filled 2022-06-03: qty 10

## 2022-06-03 MED ORDER — HEPARIN (PORCINE) 25000 UT/250ML-% IV SOLN
1100.0000 [IU]/h | INTRAVENOUS | Status: DC
  Administered 2022-06-04 (×2): 1300 [IU]/h via INTRAVENOUS
  Filled 2022-06-03 (×4): qty 250

## 2022-06-03 MED ORDER — PERAMPANEL 2 MG PO TABS
4.0000 mg | ORAL_TABLET | Freq: Every day | ORAL | Status: DC
  Administered 2022-06-03 – 2022-06-12 (×9): 4 mg
  Filled 2022-06-03 (×9): qty 2

## 2022-06-03 MED ORDER — ETOMIDATE 2 MG/ML IV SOLN
20.0000 mg | Freq: Once | INTRAVENOUS | Status: AC
Start: 2022-06-03 — End: 2022-06-03
  Administered 2022-06-03: 20 mg via INTRAVENOUS
  Filled 2022-06-03: qty 10

## 2022-06-03 MED ORDER — PHENOBARBITAL SODIUM 65 MG/ML IJ SOLN
65.0000 mg | Freq: Two times a day (BID) | INTRAMUSCULAR | Status: DC
  Administered 2022-06-03 – 2022-06-08 (×12): 65 mg via INTRAVENOUS
  Filled 2022-06-03 (×12): qty 1

## 2022-06-03 MED ORDER — MIDAZOLAM HCL 2 MG/2ML IJ SOLN: 5.0000 mg | Freq: Once | INTRAMUSCULAR | Status: DC

## 2022-06-03 MED ORDER — FENTANYL CITRATE PF 50 MCG/ML IJ SOSY: 200.0000 ug | PREFILLED_SYRINGE | Freq: Once | INTRAMUSCULAR | Status: DC

## 2022-06-03 MED ORDER — ROCURONIUM BROMIDE 50 MG/5ML IV SOLN
100.0000 mg | Freq: Once | INTRAVENOUS | Status: AC
  Administered 2022-06-03: 100 mg via INTRAVENOUS
  Filled 2022-06-03: qty 10

## 2022-06-03 MED ORDER — LIP MEDEX EX OINT
TOPICAL_OINTMENT | CUTANEOUS | Status: DC | PRN
  Administered 2022-06-09 – 2022-06-16 (×10): 75 via TOPICAL
  Administered 2022-06-16: 1 via TOPICAL
  Filled 2022-06-03: qty 7

## 2022-06-03 MED ORDER — LIDOCAINE-EPINEPHRINE (PF) 2 %-1:200000 IJ SOLN
INTRAMUSCULAR | Status: AC
  Administered 2022-06-03: 20 mL
  Filled 2022-06-03: qty 20

## 2022-06-03 MED ORDER — LIDOCAINE HCL (PF) 1 % IJ SOLN
INTRAMUSCULAR | Status: AC
  Filled 2022-06-03: qty 5

## 2022-06-03 NOTE — Procedures (Signed)
Diagnostic Bronchoscopy  Valerie Cline  [id number]  [birth year]  Date:06/03/22  Time:2:33 PM   Provider Performing:Yecenia Dalgleish   Procedure: Diagnostic Bronchoscopy (54650)  Indication(s) Assist with direct visualization of tracheostomy placement  Consent Risks of the procedure as well as the alternatives and risks of each were explained to the patient and/or caregiver.  Consent for the procedure was obtained.   Anesthesia See separate tracheostomy note   Time Out Verified patient identification, verified procedure, site/side was marked, verified correct patient position, special equipment/implants available, medications/allergies/relevant history reviewed, required imaging and test results available.   Sterile Technique Usual hand hygiene, masks, gowns, and gloves were used   Procedure Description Bronchoscope advanced through endotracheal tube and into airway.  After suctioning out tracheal secretions, bronchoscope used to provide direct visualization of tracheostomy placement.     Complications/Tolerance None; patient tolerated the procedure well.   EBL None  Specimen(s) None  Kipp Brood, MD [facility] ICU Physician [place]  Pager: [phone] Or Epic Secure Chat After hours: [phone].  06/03/2022, 2:34 PM

## 2022-06-03 NOTE — Procedures (Signed)
Patient Name: Valerie Cline  MRN: [id number]  Epilepsy Attending: Lora Havens  Referring Physician/Provider: Greta Doom, MD   Duration: 06/02/2022 1014 to 06/03/2022 1014   Patient history: 66yo F with ams. EEG to evaluate for seizure   Level of alertness: lethargic, asleep   AEDs during EEG study: propofol, versed, Keppra, Vimpat   Technical aspects: This EEG study was done with scalp electrodes positioned according to the 10-20 International system of electrode placement. Electrical activity was acquired at a sampling rate of '500Hz'$  and reviewed with a high frequency filter of '70Hz'$  and a low frequency filter of '1Hz'$ . EEG data were recorded continuously and digitally stored.    Description: EEG initially showed burst suppression pattern with bursts of generalized polymorphic sharply contoured 3 to 7 Hz theta-delta slowing lasting 2-5 seconds alternating with about 30 seconds of EEG suppression.  Gradually as propofol was weaned, EEG showed near continuous polymorphic sharply contoured 3 to 7 Hz theta-delta slowing admixed with intermittent 1 to 2 seconds of generalized EEG suppression. Polyspikes were also noted in left posterior quadrant which at times appeared quasiperiodic at 1 Hz.  One seizure without clinical sign was noted on 06/03/2022 at 0340 arising from left posterior quadrant, lasting about 45 seconds  ABNORMALITY -Focal seizure, left posterior quadrant -Polyspikes, left posterior quadrant -Continuous slow, generalized  IMPRESSION: This study showed one seizure without clinical signs arising from left posterior quadrant on 06/03/2022 at 0340, lasting about 45 seconds.  Additionally there was evidence of epileptogenicity in left posterior quadrant with high risk for seizure recurrence.  Lastly there was severe diffuse encephalopathy, nonspecific etiology.     Hanad Leino [name]

## 2022-06-03 NOTE — Procedures (Signed)
Cortrak  Person Inserting Tube:  Alroy Dust, [place] Depaz L, RD Tube Type:  Cortrak - 43 inches Tube Size:  10 Tube Location:  Left nare Secured by: Bridle Technique Used to Measure Tube Placement:  Marking at nare/corner of mouth Cortrak Secured At:  75 cm   Cortrak Tube Team Note:  Consult received to place a Cortrak feeding tube.   X-ray is required, abdominal x-ray has been ordered by the Cortrak team. Please confirm tube placement before using the Cortrak tube.   If the tube becomes dislodged please keep the tube and contact the Cortrak team at [email or web address] ([email or web address]) for replacement.  If after hours and replacement cannot be delayed, place a NG tube and confirm placement with an abdominal x-ray.    Hermina Barters RD, LDN Clinical Dietitian See [place] for contact information.

## 2022-06-03 NOTE — Progress Notes (Signed)
ANTICOAGULATION CONSULT NOTE- follow-up  Pharmacy Consult for heparin Indication: pulmonary embolus  Allergies  Allergen Reactions   Canagliflozin Other (See Comments)    recurrent vaginitis   Glucophage [Metformin] Other (See Comments)    GI intolerance   Niaspan [Niacin] Other (See Comments)    flushing    Patient Measurements: Height: 5' 5.98" (167.6 cm) Weight: 87.4 kg (192 lb 10.9 oz) IBW/kg (Calculated) : 59.25 Heparin Dosing Weight: 79kg  Vital Signs: Temp: 98.2 F (36.8 C) (08/04 1300) Temp Source: Rectal (08/04 1200) BP: 138/44 (08/04 1502) Pulse Rate: 79 (08/04 1502)  Labs: Recent Labs    06/01/22 0400 06/01/22 1532 06/02/22 0500 06/02/22 1400 06/02/22 1530 06/02/22 2235 06/03/22 0415 06/03/22 0644  HGB 9.4*  --  9.5*  --   --   --  9.9*  --   HCT 29.8*  --  30.1*  --   --   --  31.4*  --   PLT 294  --  270  --   --   --  324  --   HEPARINUNFRC 0.50   < >  --  0.99*  --  1.02*  --  0.70  CREATININE 1.18*   < > 1.15*  --  1.07*  --  1.12*  --    < > = values in this interval not displayed.     Estimated Creatinine Clearance: 54.2 mL/min (A) (by C-G formula based on SCr of 1.12 mg/dL (H)).   Assessment: 30 yoF admitted with meningitis from OSH. Pt with ongoing fevers and worsening clinical status 7/29, RV severely down on beside ECHO so concerns for acute PE. Pt received alteplase on 7/30 for suspected saddle PE.    Heparin was stopped at noon for trach. Confirmed with MD - can restart in 4 hours. Will resume at previous rate. No s/sx of bleeding.   Goal of Therapy:  Heparin level 0.3-0.7 units/mL Monitor platelets by anticoagulation protocol: Yes   Plan:  Restart heparin to 1300 units/hr on 8/4'@1900'$  Check 8hr heparin level from resumption Monitor daily CBC, s/sx bleeding  Thank you for allowing pharmacy to participate in this patient's care.  Antonietta Jewel, PharmD, [place] Clinical Pharmacist  Phone: [phone] 06/03/2022 3:19 PM  Please  check AMION for all [place] phone numbers After 10:00 PM, call [place] [phone]

## 2022-06-03 NOTE — Progress Notes (Addendum)
ANTICOAGULATION CONSULT NOTE- follow-up  Pharmacy Consult for heparin Indication: pulmonary embolus  Allergies  Allergen Reactions   Canagliflozin Other (See Comments)    recurrent vaginitis   Glucophage [Metformin] Other (See Comments)    GI intolerance   Niaspan [Niacin] Other (See Comments)    flushing    Patient Measurements: Height: 5' 5.98" (167.6 cm) Weight: 87.4 kg (192 lb 10.9 oz) IBW/kg (Calculated) : 59.25 Heparin Dosing Weight: 79kg  Vital Signs: Temp: 96.6 F (35.9 C) (08/04 0800) Temp Source: Rectal (08/04 0800) BP: 123/55 (08/04 0800) Pulse Rate: 72 (08/04 0800)  Labs: Recent Labs    06/01/22 0400 06/01/22 1532 06/02/22 0500 06/02/22 1400 06/02/22 1530 06/02/22 2235 06/03/22 0415 06/03/22 0644  HGB 9.4*  --  9.5*  --   --   --  9.9*  --   HCT 29.8*  --  30.1*  --   --   --  31.4*  --   PLT 294  --  270  --   --   --  324  --   HEPARINUNFRC 0.50   < >  --  0.99*  --  1.02*  --  0.70  CREATININE 1.18*   < > 1.15*  --  1.07*  --  1.12*  --    < > = values in this interval not displayed.     Estimated Creatinine Clearance: 54.2 mL/min (A) (by C-G formula based on SCr of 1.12 mg/dL (H)).   Assessment: 71 yoF admitted with meningitis from OSH. Pt with ongoing fevers and worsening clinical status 7/29, RV severely down on beside ECHO so concerns for acute PE. Pt received alteplase on 7/30 for suspected saddle PE.  Heparin level remains supratherapeutic and decreased to upper end of therapeutic at 0.7 after holding drip for  1 hour and resuming at 1450 units/hr. CBC stable. Urine noted to be red. Will decrease heparin to remain within therapeutic range.  Goal of Therapy:  Heparin level 0.3-0.7 units/mL Monitor platelets by anticoagulation protocol: Yes   Plan:  Decrease heparin to 1300 units/hr Check 8hr heparin level from resumption Monitor daily CBC, s/sx bleeding  Thank you for allowing pharmacy to participate in this patient's care.  Reatha Harps, PharmD PGY2 Pharmacy Resident 06/03/2022 8:45 AM Check [email or web address] for unit specific pharmacy number

## 2022-06-03 NOTE — Progress Notes (Signed)
Critical WBC 61.3 this AM.    Dellia Nims RN, notified.  Also noted urine to be red/bloody when previously tea colored.  Heparin level due to be drawn, previously super therapeutic.  No other signs of bleeding observed.  Information to be relayed to Dr. Oletta Darter.

## 2022-06-03 NOTE — Progress Notes (Signed)
[facility] Progress Note    66 y.o. year-old w/ hx HTN, hL, DM, CAD, carotid stenosis and hx NHL last chemoRx June 2023 presented to OSH on 7/21 for LUE tremors w/ recent hand-foot-and mouth disease. Work-up showed neg MRI of brain but MRI of C-spine showed sig cord compression at C4-5. Decompensated w/ high givers and AMS, concerning for meningitis and was moved to ICU. LP showed wbc 55 and CT showed multifocal PNA. Pt transferred to [facility] on 7/24. On 7/25 pt become poorly responsiveness and was intubated and moved to ICU. Fevers continued and pt developed hypotension/ shock on 7/29 and started on vaso and levo gtts.  Creat 0.8 on admit and cont to rise with hyperkalemia as well -> CRRT.    Assessment/ Plan:   AKI - b/l creat 0.8 on admission. Creat rising now in setting of new onset shock. Pt admitted for viral meningoencephalitis complicated by critical cervical myopathy and now resp failure w/ multifocal PNA. Pt has lymphoma receiving active chemoRx (rituximab) w/ last dose in June 2023. AKI due to shock which started in last 24 hrs abruptly. CCM suspecting acute PE, s/p TPA and now on IV heparin. K+ up to 6.8 -> CCRT 7/30 w/ all 2K fluids -> 4K now. No signs of renal recovery. Very minimal UOP.  Seen on CRRT On 4K fluids  500/400/1500 pre/post/qd Now back on low dose Levophed 75mg (primarily bec of the heavy sedation) and tolerating 100 ml/hr. Now 7310 mL pos during hospitalization.   Will increase dialysate to 2000 and monitor K  Volume - mod edema on exam, I/O + 9.9  L since admit. Tolerating 547mhr and will try 10025mr. Shock - maybe from multilobar PNA + viral menigoencephalitis, as above, on vasopressor support,  H/o lymphoma - getting active chemoRx w/ last dose rituxan in June 2023 AHRF - on vent, multifocal PNA on broad spec IV abx per CCM Status epilepticus on propofol AMS - in setting of viral meningoencephalitis  Cervical myelopathy  DM2 - on  insulin  Subjective:   Daughter bedside; tolerating UF now that she's back on Levophed (low dose).   Objective:   BP (!) 118/51   Pulse 76   Temp 98.1 F (36.7 C)   Resp (!) 28   Ht 5' 5.98" (1.676 m)   Wt 87.4 kg   SpO2 97%   BMI 31.12 kg/m   Intake/Output Summary (Last 24 hours) at 06/03/2022 1156 Last data filed at 06/03/2022 1100 Gross per 24 hour  Intake 4328.81 ml  Output 6974 ml  Net -2645.19 ml   Weight change: -1.5 kg  Physical Exam: Gen on vent, sedated No rash, cyanosis or gangrene Sclera anicteric, throat w/ ETT No jvd or bruits Chest clear anterior/ latera RRR no RG Abd soft ntnd no mass or ascites +bs GU foley draining clear light amber urine Ext 1+ bilat UE > LE edema, no wounds or ulcers Neuro is on vent, sedated  Imaging: CT HEAD WO CONTRAST (5MM)  Result Date: 06/01/2022 CLINICAL DATA:  Meningitis/CNS infection suspected EXAM: CT HEAD WITHOUT CONTRAST TECHNIQUE: Contiguous axial images were obtained from the base of the skull through the vertex without intravenous contrast. RADIATION DOSE REDUCTION: This exam was performed according to the departmental dose-optimization program which includes automated exposure control, adjustment of the mA and/or kV according to patient size and/or use of iterative reconstruction technique. COMPARISON:  05/29/2022 CT head, 05/25/2022 MRI, CT head 05/20/2022 FINDINGS: Brain: Again noted is poor visualization of the  sulci in the parietal and occipital lobes. No evidence of acute infarction, hemorrhage, mass, mass effect, or midline shift. No hydrocephalus or definite extra-axial fluid collection. Vascular: No hyperdense vessel. Atherosclerotic calcifications in the intracranial carotid and vertebral arteries. Skull: Normal. Negative for fracture or focal lesion. Sinuses/Orbits: No acute finding. Other: Fluid in the right-greater-than-left left mastoid air cells. IMPRESSION: Poor visualization of the parietal and occipital sulci,  similar to 05/29/2022, which is consistent with the abnormal CSF signal and suspected meningitis seen on the 05/25/2022 MRI, although no discrete extra-axial collection is seen. Electronically Signed   By: Merilyn Baba M.D.   On: 06/01/2022 13:12    Labs: BMET Recent Labs  Lab 05/31/22 0417 05/31/22 0740 05/31/22 1456 06/01/22 0400 06/01/22 1532 06/02/22 0500 06/02/22 1530 06/03/22 0415  NA 138 138 138 137 134* 135 136 135  K 4.2 4.3 4.3 4.5 4.2 4.5 5.3* 5.3*  CL 102 103 103 100 103 102 104 103  CO2 '26 26 26 24 23 23 25 23  '$ GLUCOSE 320* 346* 378* 427* 256* 186* 149* 193*  BUN 46* 46* 47* 46* 43* 40* 41* 40*  CREATININE 1.53* 1.58* 1.34* 1.18* 1.33* 1.15* 1.07* 1.12*  CALCIUM 7.6* 7.5* 7.7* 7.8* 7.4* 7.6* 7.7* 7.9*  PHOS 3.7  --  4.3 3.7 3.9 3.7 3.7 4.2   CBC Recent Labs  Lab 05/31/22 0417 06/01/22 0400 06/02/22 0500 06/03/22 0415  WBC 12.9* 29.5* 46.7* 61.3*  NEUTROABS  --   --   --  57.6*  HGB 8.1* 9.4* 9.5* 9.9*  HCT 25.7* 29.8* 30.1* 31.4*  MCV 78.1* 79.5* 78.4* 78.7*  PLT 167 294 270 324    Medications:     Chlorhexidine Gluconate Cloth  6 each Topical Q0600   Chlorhexidine Gluconate Cloth  6 each Topical Daily   docusate  100 mg Per Tube BID   docusate  100 mg Per Tube BID   etomidate  20 mg Intravenous Once   fentaNYL (SUBLIMAZE) injection  200 mcg Intravenous Once   ipratropium-albuterol  3 mL Nebulization Q4H   methylPREDNISolone (SOLU-MEDROL) injection  125 mg Intravenous Q12H   midazolam  5 mg Intravenous Once   multivitamin  1 tablet Per Tube QHS   mouth rinse  15 mL Mouth Rinse Q2H   pantoprazole sodium  40 mg Per Tube Daily   perampanel  4 mg Per Tube Q1200   PHENObarbital  65 mg Intravenous BID   polyethylene glycol  17 g Per Tube Daily   rocuronium  100 mg Intravenous Once   sodium chloride flush  10-40 mL Intracatheter Q12H   sodium chloride flush  10-40 mL Intracatheter Q12H      Otelia Santee, MD 06/03/2022, 11:56 AM

## 2022-06-03 NOTE — Progress Notes (Addendum)
[facility] Infectious Diseases Follow Up Note  Patient Identification: Patient Name: Valerie Cline MRN: [id number] [place] Date: 05/30/2022  9:11 PM Age: 66 y.o.Today's Date: 06/03/2022  Reason for Visit: Possible Viral meningoencephalitis/leukocytosis  Principal Problem:   Meningitis Active Problems:   Severe sepsis ([place])   Acute metabolic encephalopathy   Acute respiratory failure with hypoxia (HCC)   Multifocal pneumonia   Meningioma (HCC)   Cervical myelopathy (HCC)   UTI (urinary tract infection)  Meropenem 7/28-7/31 Vancomycin 7/24-7/26, Vancomycin 7/30-7/31 Ceftriaxone 7/24, 7/27, Ceftriaxone 8/1-c Metronidazole 8/1-c Cefepime 7/25-7/26 Ampicillin 7/24-7/25 Acyclovir 7/24-7/26    Lines/Hardwares: RT IJ non tunneled HD Catheter, Left UE PICC, Left radial art line    Interval Events: WBC went up to 61 K but on very low dose levophed. Subclinical seizure noted this morning in EEG   Assessment #Probable  viral meningoencephalitis in the setting of recent hand-foot and mouth disease with positive enterovirus in RVP in an immunocompromised patient Per [facility] Microbiology  CSF cx 7/24 NG, gram stain negative, HSV1 and 2 DNA negative  ( CSF WBC 55, N 84%, L 5%, M 2%, mesothelial cells and other cells 10%, RBC 2) Blood cx 7/21 and 7/22 negative) RMSF IgM and IgG + Lyme total ab 5/39 negative Ehrlichia PCR negative Entero virus was verbally reported to be negative to ID pharmacy 8/2. However. Entero virus and arbo virus PCR actually pending per conversation 8/3 with Micro at [employer]  # Seizures/Status Epilepticus  - on antiepileptic, off propofol. Versed being tapered  - EEG 8/4 Focal seizure, left posterior quadrant, Polyspikes, left posterior quadrant - Neurology following   # Acute Respiratory Failure in the setting of encephalopathy + multolobar PNA 7/25 BAL cx AFB negative, gram stain and cx negative, fungal cx  pending  7/28 tracheal aspirate cx normal resp flora Plan for tracheostomy today   #Small lymphocytic B cell non-Hodgkin lymphoma # Hypogammoglobulinemia  On maintenance rituximab, last received June, 2023.   Follows Dr. Marin Olp 7/31 IGG/IGA low, IGM WNL, started on IVIG 8/2  #Suspected saddle pulmonary embolism in the setting of sudden onset shock/acute renal and liver failure ( Sock liver), RV dilatation with severe metabolic acidosis Status post tPA with improvement in hemodynamics, on IV heparin On CRRT, nephrology following 7/30 Liver US s/o cirrhosis, Liver enzymes down trending   # Critical Cervical Myelopathy/ACDF C5-C7/Progressive Weakness and Tremors  On solumedrol, Neurology following   Recommendations Continue meropenem empirically for now although no evidence of bacterial infection. Suspect leukocytosis is a leukemoid reaction in the setting of critical illness. However, consider CT abdomen/pelvis to look for occult sources of infection/abscesses.   Concerns for PML noted per Oncology. Have reached out  to Microbiology at [facility] to see if we can add CSF JC virus PCR, unsuccessful  Will add EBV PCR, CMV DNA, JC virus PCR, VZV PCR and Cryptococcal ag in blood  D/w Dr Tacy Learn.   Addendum - Entero virus PCR from CSF + from [place] ( see media)  Dr Baxter Flattery covering this weekend. I am back Monday   Thank you for the consult. Please page with pertinent questions or concerns. _____________________________________________________________________ Subjective patient seen and examined at the bedside.  Fentanyl, heparin, insulin, versed, low dose vasopressin   Vitals BP (!) 118/51   Pulse 76   Temp 98.1 F (36.7 C)   Resp (!) 28   Ht 5' 5.98" (1.676 m)   Wt 87.4 kg   SpO2 97%   BMI 31.12 kg/m   Physical Exam  Constitutional: Orotracheally intubated, on CRRT    Comments: EEG leads on head , Generalized edema  Cardiovascular:     Rate and Rhythm: Normal rate and  regular rhythm.     Heart sounds:   Pulmonary:     Effort: Pulmonary effort is normal on mechanical ventilation    Comments: Coarse breath sounds  Abdominal:     Palpations: Abdomen is soft.     Tenderness: Nondistended and nontender  Musculoskeletal:        General: No swelling  in peripheral joints.   Skin:    Comments: RT IJ non tunneled HD Catheter, Left UE PICC, Left radial art line   Neurological:     General: Unresponsive   Pertinent Microbiology Results for orders placed or performed during the hospital encounter of 05/14/2022  MRSA Next Gen by PCR, Nasal     Status: None   Collection Time: 05/02/2022  9:42 PM   Specimen: Nasal Mucosa; Nasal Swab  Result Value Ref Range Status   MRSA by PCR Next Gen NOT DETECTED NOT DETECTED Final    Comment: (NOTE) The GeneXpert MRSA Assay (FDA approved for NASAL specimens only), is one component of a comprehensive MRSA colonization surveillance program. It is not intended to diagnose MRSA infection nor to guide or monitor treatment for MRSA infections. Test performance is not FDA approved in patients less than 23 years old. Performed at [facility], [place] [address], [place], [place] [address]   Urine Culture     Status: None   Collection Time: 05/24/22  2:11 AM   Specimen: Urine  Result Value Ref Range Status   Specimen Description URINE, CLEAN CATCH  Final   Special Requests NONE  Final   Culture   Final    NO GROWTH Performed at [facility], [address], [place], [place] [address]    Report Status 05/25/2022 FINAL  Final  Culture, blood (Routine X 2) w Reflex to ID Panel     Status: None   Collection Time: 05/24/22  6:16 AM   Specimen: BLOOD RIGHT HAND  Result Value Ref Range Status   Specimen Description BLOOD RIGHT HAND  Final   Special Requests   Final    BOTTLES DRAWN AEROBIC AND ANAEROBIC Blood Culture adequate volume   Culture   Final    NO GROWTH 5 DAYS Performed at [facility], [place] [address], [place], [place] [address]    Report Status 05/29/2022 FINAL  Final  Culture, blood (Routine X 2) w Reflex to ID Panel     Status: None   Collection Time: 05/24/22  6:17 AM   Specimen: BLOOD LEFT HAND  Result Value Ref Range Status   Specimen Description BLOOD LEFT HAND  Final   Special Requests   Final    BOTTLES DRAWN AEROBIC AND ANAEROBIC Blood Culture adequate volume   Culture   Final    NO GROWTH 5 DAYS Performed at [facility], [place] [address], [place], [place] [address]    Report Status 05/29/2022 FINAL  Final  Culture, Respiratory w Gram Stain     Status: None   Collection Time: 05/24/22  9:24 AM   Specimen: Bronchoalveolar Lavage; Respiratory  Result Value Ref Range Status   Specimen Description BRONCHIAL ALVEOLAR LAVAGE  Final   Special Requests Immunocompromised  Final   Gram Stain   Final    RARE WBC PRESENT, PREDOMINANTLY MONONUCLEAR NO ORGANISMS SEEN    Culture  Final    NO GROWTH 2 DAYS Performed at [facility], [place] [address], [place], [place] [address]    Report Status 05/26/2022 FINAL  Final  Acid Fast Smear (AFB)     Status: None   Collection Time: 05/24/22  9:24 AM   Specimen: Bronchial Alveolar Lavage  Result Value Ref Range Status   AFB Specimen Processing Concentration  Final   Acid Fast Smear Negative  Final    Comment: (NOTE) Performed At: [facility] [place], [place] [id number] Rush Farmer MD [id number]    Source (AFB) BRONCHIAL ALVEOLAR LAVAGE  Final    Comment: Performed at [facility], [place] [address], [place], [place] [address]  Fungus Culture With Stain     Status: None (Preliminary result)   Collection Time: 05/24/22  9:24 AM   Specimen: Bronchial Alveolar Lavage  Result Value Ref Range Status   Fungus Stain Final report  Final    Comment: (NOTE) Performed At: [facility] [place], [place] [id number] Rush Farmer MD [id number]    Fungus (Mycology)  Culture PENDING  Incomplete   Fungal Source BRONCHIAL ALVEOLAR LAVAGE  Final    Comment: Performed at [facility], [place] [address] [place], [place] [address]  Respiratory (~20 pathogens) panel by PCR     Status: None   Collection Time: 05/24/22  9:24 AM   Specimen: Bronchial Alveolar Lavage; Respiratory  Result Value Ref Range Status   Adenovirus NOT DETECTED NOT DETECTED Final   Coronavirus 229E NOT DETECTED NOT DETECTED Final    Comment: (NOTE) The Coronavirus on the Respiratory Panel, DOES NOT test for the novel  Coronavirus (2019 nCoV)    Coronavirus HKU1 NOT DETECTED NOT DETECTED Final   Coronavirus NL63 NOT DETECTED NOT DETECTED Final   Coronavirus OC43 NOT DETECTED NOT DETECTED Final   Metapneumovirus NOT DETECTED NOT DETECTED Final   Rhinovirus / Enterovirus NOT DETECTED NOT DETECTED Final   Influenza A NOT DETECTED NOT DETECTED Final   Influenza B NOT DETECTED NOT DETECTED Final   Parainfluenza Virus 1 NOT DETECTED NOT DETECTED Final   Parainfluenza Virus 2 NOT DETECTED NOT DETECTED Final   Parainfluenza Virus 3 NOT DETECTED NOT DETECTED Final   Parainfluenza Virus 4 NOT DETECTED NOT DETECTED Final   Respiratory Syncytial Virus NOT DETECTED NOT DETECTED Final   Bordetella pertussis NOT DETECTED NOT DETECTED Final   Bordetella Parapertussis NOT DETECTED NOT DETECTED Final   Chlamydophila pneumoniae NOT DETECTED NOT DETECTED Final   Mycoplasma pneumoniae NOT DETECTED NOT DETECTED Final    Comment: Performed at [facility], [place]. [address], [place],  [address]  Fungus Culture Result     Status: None   Collection Time: 05/24/22  9:24 AM  Result Value Ref Range Status   Result 1 Comment  Final    Comment: (NOTE) KOH/Calcofluor preparation:  no fungus observed. Performed At: [facility] [place], [place] [id number] Rush Farmer MD [id number]   Culture, Respiratory w Gram Stain     Status: None   Collection Time: 05/27/22   8:31 AM   Specimen: Tracheal Aspirate; Respiratory  Result Value Ref Range Status   Specimen Description TRACHEAL ASPIRATE  Final   Special Requests NONE  Final   Gram Stain   Final    ABUNDANT WBC PRESENT, PREDOMINANTLY PMN FEW GRAM NEGATIVE RODS RARE GRAM POSITIVE COCCI IN PAIRS    Culture   Final  Normal respiratory flora-no Staph aureus or Pseudomonas seen Performed at [place] [address], [place], [place] [address]    Report Status 05/29/2022 FINAL  Final  Respiratory (~20 pathogens) panel by PCR     Status: Abnormal   Collection Time: 05/28/22  1:53 PM   Specimen: Nasopharyngeal Swab; Respiratory  Result Value Ref Range Status   Adenovirus NOT DETECTED NOT DETECTED Final   Coronavirus 229E NOT DETECTED NOT DETECTED Final    Comment: (NOTE) The Coronavirus on the Respiratory Panel, DOES NOT test for the novel  Coronavirus (2019 nCoV)    Coronavirus HKU1 NOT DETECTED NOT DETECTED Final   Coronavirus NL63 NOT DETECTED NOT DETECTED Final   Coronavirus OC43 NOT DETECTED NOT DETECTED Final   Metapneumovirus NOT DETECTED NOT DETECTED Final   Rhinovirus / Enterovirus DETECTED (A) NOT DETECTED Final   Influenza A NOT DETECTED NOT DETECTED Final   Influenza B NOT DETECTED NOT DETECTED Final   Parainfluenza Virus 1 NOT DETECTED NOT DETECTED Final   Parainfluenza Virus 2 NOT DETECTED NOT DETECTED Final   Parainfluenza Virus 3 NOT DETECTED NOT DETECTED Final   Parainfluenza Virus 4 NOT DETECTED NOT DETECTED Final   Respiratory Syncytial Virus NOT DETECTED NOT DETECTED Final   Bordetella pertussis NOT DETECTED NOT DETECTED Final   Bordetella Parapertussis NOT DETECTED NOT DETECTED Final   Chlamydophila pneumoniae NOT DETECTED NOT DETECTED Final   Mycoplasma pneumoniae NOT DETECTED NOT DETECTED Final    Comment: Performed at [facility], [place] [address] [place], [place] [address]  Culture, blood (Routine X 2) w Reflex to ID Panel     Status: None (Preliminary  result)   Collection Time: 05/29/22  8:58 AM   Specimen: BLOOD RIGHT HAND  Result Value Ref Range Status   Specimen Description BLOOD RIGHT HAND  Final   Special Requests   Final    BOTTLES DRAWN AEROBIC AND ANAEROBIC Blood Culture adequate volume   Culture   Final    NO GROWTH 4 DAYS Performed at [facility], [place] [address], [place], [place] [address]    Report Status PENDING  Incomplete  Culture, blood (Routine X 2) w Reflex to ID Panel     Status: None (Preliminary result)   Collection Time: 05/29/22  8:58 AM   Specimen: BLOOD RIGHT HAND  Result Value Ref Range Status   Specimen Description BLOOD RIGHT HAND  Final   Special Requests IN PEDIATRIC BOTTLE Blood Culture adequate volume  Final   Culture   Final    NO GROWTH 4 DAYS Performed at [facility], [place] [address] [place], [place] [address]    Report Status PENDING  Incomplete   Pertinent Lab.    Latest Ref Rng & Units 06/03/2022    4:15 AM 06/02/2022    5:00 AM 06/01/2022    4:00 AM  CBC  WBC 4.0 - 10.5 K/uL 61.3  46.7  29.5   Hemoglobin 12.0 - 15.0 g/dL 9.9  9.5  9.4   Hematocrit 36.0 - 46.0 % 31.4  30.1  29.8   Platelets 150 - 400 K/uL 324  270  294       Latest Ref Rng & Units 06/03/2022    4:15 AM 06/02/2022    3:30 PM 06/02/2022    5:00 AM  CMP  Glucose 70 - 99 mg/dL 193  149  186   BUN 8 - 23 mg/dL 40  41  40  Creatinine 0.44 - 1.00 mg/dL 1.12  1.07  1.15   Sodium 135 - 145 mmol/L 135  136  135   Potassium 3.5 - 5.1 mmol/L 5.3  5.3  4.5   Chloride 98 - 111 mmol/L 103  104  102   CO2 22 - 32 mmol/L _0 Calcium 8.9 - 10.3 mg/dL 7.9  7.7  7.6   Total Protein 6.5 - 8.1 g/dL   6.0   Total Bilirubin 0.3 - 1.2 mg/dL   1.3   Alkaline Phos 38 - 126 U/L   164   AST 15 - 41 U/L   201   ALT 0 - 44 U/L   1,482      Pertinent Imaging today Plain films and CT images have been personally visualized and interpreted; radiology reports have been reviewed. Decision making incorporated into the  Impression / Recommendations.  No results found.   I spent 52  minutes for this patient encounter including review of prior medical records, coordination of care with primary/other specialist with greater than 50% of time being face to face/counseling and discussing diagnostics/treatment plan with the patient/family.  Electronically signed by:   Rosiland Oz, MD Infectious Disease Physician [facility] Infect[facility] Pager: [phone]

## 2022-06-03 NOTE — Progress Notes (Addendum)
Subjective: 145 seconds subclinical seizures this morning.  No further seizures overnight.  Sister at bedside.  Alcohol level pending.  ROS: Unable to obtain due to poor mental status  Examination  Vital signs in last 24 hours: Temp:  [96.6 F (35.9 C)-98.4 F (36.9 C)] 98.1 F (36.7 C) (08/04 1100) Pulse Rate:  [67-102] 76 (08/04 1100) Resp:  [28] 28 (08/04 1100) BP: (95-150)/(45-55) 145/47 (08/04 1105) SpO2:  [92 %-99 %] 98 % (08/04 1105) Arterial Line BP: (114-171)/(37-53) 147/47 (08/04 1100) FiO2 (%):  [40 %] 40 % (08/04 1105) Weight:  [87.4 kg] 87.4 kg (08/04 0340)  General: lying in bed, NAD Neuro: On versed'@20ml'$ /hr,  comatose, pupils 3 equal and reactive to light, subtle corneal reflex present, gag reflex absent, does not withdraw to noxious stimuli in all extremities    Basic Metabolic Panel: Recent Labs  Lab 05/30/22 0455 05/30/22 1600 05/31/22 0417 05/31/22 0740 06/01/22 0400 06/01/22 1532 06/02/22 0500 06/02/22 1530 06/03/22 0415  NA 143   < > 138   < > 137 134* 135 136 135  K 4.3   < > 4.2   < > 4.5 4.2 4.5 5.3* 5.3*  CL 102   < > 102   < > 100 103 102 104 103  CO2 27   < > 26   < > '24 23 23 25 23  '$ GLUCOSE 166*   < > 320*   < > 427* 256* 186* 149* 193*  BUN 48*   < > 46*   < > 46* 43* 40* 41* 40*  CREATININE 2.03*   < > 1.53*   < > 1.18* 1.33* 1.15* 1.07* 1.12*  CALCIUM 7.6*   < > 7.6*   < > 7.8* 7.4* 7.6* 7.7* 7.9*  MG 2.3  --  2.6*  --  2.9*  --  2.5*  --  2.6*  PHOS 5.1*   < > 3.7   < > 3.7 3.9 3.7 3.7 4.2   < > = values in this interval not displayed.    CBC: Recent Labs  Lab 05/30/22 0455 05/31/22 0417 06/01/22 0400 06/02/22 0500 06/03/22 0415  WBC 9.1 12.9* 29.5* 46.7* 61.3*  NEUTROABS  --   --   --   --  57.6*  HGB 8.4* 8.1* 9.4* 9.5* 9.9*  HCT 27.2* 25.7* 29.8* 30.1* 31.4*  MCV 79.3* 78.1* 79.5* 78.4* 78.7*  PLT 148* 167 294 270 324     Coagulation Studies: No results for input(s): "LABPROT", "INR" in the last 72  hours.  Imaging No new brain imaging overnight  ASSESSMENT AND PLAN: 66 year old female with suspected viral meningo-encephalitis (had hand-foot-and-mouth disease prior to presentation) now with status epilepticus.   Focal convulsive status epilepticus, resolved Provoked seizures Acute encephalopathy, infectious Viral meningo-encephalitis Left meningioma Hypertriglyceridemia -Status epilepticus likely due to viral encephalitis, meningioma (meningiomas do not commonly cause status epilepticus but the location of this meningioma is concerning).     Recommendations -Reduce Versed by 10 mL/h to stop -Start perampanel 4 mg daily.  Of note patient is on CRRT and perampanel is not starting any kidney disease.  However, it has unique mechanism of action and the loading dose appeared to be helpful yesterday (no recurrence of seizure) therefore will continue maintenance for now.  Plan to wean this off as soon as possible once seizures are controlled  -Start phenobarbital 65 mg twice daily -Continue Keppra 1 g twice daily (renally dosed), Vimpat 200 mg twice daily -Continue LTM EEG while  on sedation -We will plan to transition IV Solu-Medrol to p.o. prednisone on Monday if seizures remain well controlled -Discussed plan with patient's sister at bedside -Discussed plan with pharmacy team as well as Dr. Tacy Learn -Continue seizure precautions   CRITICAL CARE Performed by: Valerie Cline    Total critical care time:40 minutes   Critical care time was exclusive of separately billable procedures and treating other patients.   Critical care was necessary to treat or prevent imminent or life-threatening deterioration.   Critical care was time spent personally by me on the following activities: development of treatment plan with patient and/or surrogate as well as nursing, discussions with consultants, evaluation of patient's response to treatment, examination of patient, obtaining history from  patient or surrogate, ordering and performing treatments and interventions, ordering and review of laboratory studies, ordering and review of radiographic studies, pulse oximetry and re-evaluation of patient's condition.    Valerie Cline Epilepsy [facility] For questions after 5pm please refer to AMION to reach the Neurologist on call

## 2022-06-03 NOTE — Progress Notes (Signed)
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   Discharge summary reviewed: She initially presented to OSH on 05/20/22 for left upper extremity tremors. She reported recent hand foot any mouth disease contracted from her grandchild that left her weak and tired prior to this admission. Neurology work-up including MRI, EEG negative. MRI/MRA neg for acute infarct, 13 mm focus in left frontal lobe representing artifact vs small calcified meningioma, chronic small vessel ischemic changes, cerebral atrophy, high-grade stenosis in bilateral ICA, focal stenosis in left ACA and intracranial right vertebral artery. MRI of C-spine showing C4-5 cord compression from advanced disc degeneration with posterior endplate ridging.  Showing left foraminal impingement at the same level.  Also showing C5-6 and C6-7 ADCF with solid arthrodesis.  Neurologist at [facility] had recommended neurosurgical evaluation.  She developed fevers and transferred to ICU concerning for meningitis and started on Vanc, rocephin, ampicillin and acyclovir. LP was performed on 7/24 with WBC 55 RBC 2. Pending herpes, enterovirus, ehrlichia. CT A/P showed bilateral ground glass and lung opacities concerning for multifocal pneumonia.   PCCM consulted for decreased responsiveness and hypoxemia on NRB. On exam obtunded and only able to say name. O2 sats in the 70s on NRB. Transferred to the ICU for emergent intubation   Pertinent  Medical History  As above  Significant Hospital Events: Including  procedures, antibiotic start and stop dates in addition to other pertinent events   7/24 Admitted to [facility] from [place]. Transferred to PCCM when found in respiratory failure 7/25 intubated, bronched, art line placed, PICC placed.  Neuro and ID consulted. Started decadron  7/26 decadron dc/d 7/27 felt most consistent with viral meningoencephalitis  7/29-7/30 sudden onset multiorgan failure  Interim History / Subjective:  Afebrile Remains on low-dose Levophed at 2 mics White count continues to trend up, currently at 61 K Remains on CRRT and continuous EEG  Objective   Blood pressure (!) 145/47, pulse 76, temperature 98.1 F (36.7 C), resp. rate (!) 28, height 5' 5.98" (1.676 m), weight 87.4 kg, SpO2 98 %.    Vent Mode: PRVC FiO2 (%):  [40 %] 40 % Set Rate:  [28 bmp] 28 bmp Vt Set:  [480 mL] 480 mL PEEP:  [8 cmH20] 8 cmH20 Plateau Pressure:  [19 cmH20-22 cmH20] 19 cmH20   Intake/Output Summary (Last 24 hours) at 06/03/2022 1350 Last data filed at 06/03/2022 1100 Gross per 24 hour  Intake 3834.64 ml  Output 6409 ml  Net -2574.36 ml   Filed Weights   06/01/22 0439 06/02/22 0500 06/03/22 0340  Weight: 91 kg 88.9 kg 87.4 kg   Physical Exam: General: Crtitically ill-appearing female, orally intubated HEENT: [place]/AT, eyes anicteric.  ETT and OGT in place Neuro: Sedated, not following commands.  Eyes are closed.  Pupils 3 mm bilateral none reactive to light, off and gag, no movement to painful stimuli Chest: Coarse breath sounds, no wheezes or rhonchi Heart: Regular rate and rhythm, no murmurs or gallops Abdomen: Soft, nontender, nondistended, bowel sounds present Skin: No rash  Resolved Hospital Problem list   Metabolic acidosis Hypertriglycerides  being off propofol infusion Severe metabolic acidosis  Assessment & Plan:  Acute infectious encephalopathy in setting of viral meningoencephalitis  Propofol was stopped Per neurology Versed is being tapered Currently on fentanyl at 75  mics, RASS goal is -2  Acute hypoxemic/hypercapnic respiratory failure 2/2 multilobar PNA  Trach aspirate is growing oral flora Appreciate infectious disease follow-up White count continues to rise now up to 61,000 this could be related to leukemoid reaction rather than active infection as patient is hemodynamically stable with low-dose vasopressor Continue meropenem Continue lung protective ventilation Not ready for SBT due to deep sedation and status epilepticus We will proceed with tracheostomy today  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  HSV OSH neg, culture neg, arbovirus panel penidng. Critical cervical myopathy - question baseline severe DJD with meningeal swelling threatening critical stenosis cont droplet  Appreciate ID and neuro following  Continue IV steroid, defer to neurology for tapering Arboviral panel from [place] is negative now Enterovirus PCR is negative Follow-up EBV, VZV, CMV and JC virus PCR  Hypogammaglobinemia Patient is on Rituxan as an outpatient Her immunoglobulin levels were low Received IVIG  Status epilepticus Status epilepticus have improved but she still have periodic discharges Last night patient had 1 seizure about 45 seconds Versed is being tapered, currently at 10 mg/h Started on perampanel and phenobarbital Continue Keppra and Vimpat Remain on continuous EEG Appreciate neurology follow-up  UTI Completed treatment  DM2 with severe hyperglycemia  She has labile blood sugars, currently continue with insulin infusion, once stabilized we will try to switch her to long-acting  Small lymphocytic B cell non-hodgkin lymphoma On maintenance rituximab (last received April 05 2022)  Follows Dr. Marin [facility] Oncology is following Immunoglobulins levels are are low, received replacement  Sudden onset shock, acute renal failure, acute liver failure, RV  dilation Suspected saddle pulmonary embolism Patient received tPA, with improvement in hemodynamics Continue IV heparin Remain on on CRRT, will speak with nephrology stop CRRT today Nephrology is following Repeat LFTs continue to trend down  Best Practice (right click and "Reselect all SmartList Selections" daily)   Diet/type: Tube feeds DVT prophylaxis: heparin gtt GI prophylaxis: PPI Lines: PICC  Foley:  Yes, and it is still needed  Code Status:  full code Last date of multidisciplinary goals of care discussion 8/3- Family updated at bedside, please see iPal note  Total critical care time: 36 minutes  Performed by: [place] care time was exclusive of separately billable procedures and treating other patients.   Critical care was necessary to treat or prevent imminent or life-threatening deterioration.   Critical care was time spent personally by me on the following activities: development of treatment plan with patient and/or surrogate as well as nursing, discussions with consultants, evaluation of patient's response to treatment, examination of patient, obtaining history from patient or surrogate, ordering and performing treatments and interventions, ordering and review of laboratory studies, ordering and review of radiographic studies, pulse oximetry and re-evaluation of patient's condition.   Jacky Kindle, MD [facility] See Amion for pager If no response to pager, please call [phone] until 7pm After 7pm, Please call E-link [phone]

## 2022-06-03 NOTE — Progress Notes (Signed)
Overall, there really does not seem to be much in the way of changes.  The one thing that I noted that was better with her liver function studies.  She still is on dialysis.  Her BUN is 41 creatinine 1.07.  I am not sure exactly what her neurological status is.  I think she has a continuous EEG monitoring.  She is on heparin.  She is on tube feeds.  She is on 60 cc an hour of tube feeds.  She is still on pressors.  Hopefully, these can be discontinued as her pressure seems to be doing pretty well.  Her white cell count is 61,000.  I do believe this is from her being on Solu-Medrol.  Her hemoglobin is 9.9.  Platelet count 324,000.  I do not think there has been any obvious virus or bacteria cultured out of her CSF.  It is still not clear as to what the inciting event here is.  The MRI of the brain that she had back a week or so ago really was not all that definitive.  I realize that her IgG level is incredibly low.  This is unusual.  Back in June, her IgG level was just slightly decreased.  I think she did get some supplemental IVIG.  The only other thing that I could think of that might be causing all of this would be PML.  I would think this would be a little unusual without something being seen on the MRI.  However, I would think neurology would be able to tell us if this is a possibility.  If so, then I will think that she will need to have the CSF checked for JC virus.  I just still hate the fact that she has suffered like this.  I just know she is getting incredibly good care from all the staff in the CCU.  Everybody is working really hard to try to help her.  At least, her liver is better.  Her renal function seems to be doing okay.  She did have an embolus.  There is appear to be a pulmonary embolus.  Again she is on heparin for this.  As far as her lymphoma was concerned, this really is not a factor with respect to her short or long-term prognosis.  I would think that she has been in  remission and would stay in remission for several years.  We will continue to try to help.  Again I know she is getting incredible help from everybody in the CCU.  Lattie Haw, MD  Darlyn Chamber 29:11

## 2022-06-03 NOTE — Procedures (Signed)
Percutaneous Tracheostomy Procedure Note   Valerie Cline  [id number]  25-Feb-1956  Date:06/03/22  Time:2:39 PM   Provider Performing:Lateia Fraser  Procedure: Percutaneous Tracheostomy with Bronchoscopic Guidance (31600)  Indication(s) Acute respiratory failure  Consent Risks of the procedure as well as the alternatives and risks of each were explained to the patient and/or caregiver.  Consent for the procedure was obtained.  Anesthesia Etomidate, Versed, Fentanyl, Vecuronium   Time Out Verified patient identification, verified procedure, site/side was marked, verified correct patient position, special equipment/implants available, medications/allergies/relevant history reviewed, required imaging and test results available.   Sterile Technique Maximal sterile technique including sterile barrier drape, hand hygiene, sterile gown, sterile gloves, mask, hair covering.    Procedure Description Appropriate anatomy identified by palpation.  Patient's neck prepped and draped in sterile fashion.  1% lidocaine with epinephrine was used to anesthetize skin overlying neck.  1.5cm incision made and blunt dissection performed until tracheal rings could be easily palpated.   Then a size 6 Shiley tracheostomy was placed under bronchoscopic visualization using usual Seldinger technique and serial dilation.   Bronchoscope confirmed placement above the carina.  Tracheostomy was sutured in place with adhesive pad to protect skin under pressure.    Patient connected to ventilator.   Complications/Tolerance None; patient tolerated the procedure well. Chest X-ray is ordered to confirm no post-procedural complication.   EBL Minimal   Specimen(s) None

## 2022-06-03 NOTE — Progress Notes (Signed)
Performed maintenance.  Fixed C3, FZ, F4, and FP2.  All under 10.  No visible skin breakdown.

## 2022-06-03 NOTE — Procedures (Signed)
Tracheostomy Change Note  Patient Details:   Name: Valerie Cline DOB: 11/16/1955 MRN: [id number]    Airway Documentation:     Evaluation  O2 sats: stable throughout Complications: No apparent complications Patient did tolerate procedure well. Bilateral Breath Sounds: Clear, Diminished    Revonda Standard 06/03/2022, 2:52 PM

## 2022-06-04 ENCOUNTER — Inpatient Hospital Stay (HOSPITAL_COMMUNITY): Payer: Medicare Other

## 2022-06-04 DIAGNOSIS — G959 Disease of spinal cord, unspecified: Secondary | ICD-10-CM | POA: Diagnosis not present

## 2022-06-04 DIAGNOSIS — D72829 Elevated white blood cell count, unspecified: Secondary | ICD-10-CM

## 2022-06-04 DIAGNOSIS — G40901 Epilepsy, unspecified, not intractable, with status epilepticus: Secondary | ICD-10-CM | POA: Diagnosis not present

## 2022-06-04 DIAGNOSIS — J129 Viral pneumonia, unspecified: Secondary | ICD-10-CM

## 2022-06-04 DIAGNOSIS — R4182 Altered mental status, unspecified: Secondary | ICD-10-CM | POA: Diagnosis not present

## 2022-06-04 DIAGNOSIS — G9341 Metabolic encephalopathy: Secondary | ICD-10-CM | POA: Diagnosis not present

## 2022-06-04 DIAGNOSIS — G039 Meningitis, unspecified: Secondary | ICD-10-CM | POA: Diagnosis not present

## 2022-06-04 DIAGNOSIS — J9601 Acute respiratory failure with hypoxia: Secondary | ICD-10-CM | POA: Diagnosis not present

## 2022-06-04 LAB — GLUCOSE, CAPILLARY
Glucose-Capillary: 170 mg/dL — ABNORMAL HIGH (ref 70–99)
Glucose-Capillary: 141 mg/dL — ABNORMAL HIGH (ref 70–99)
Glucose-Capillary: 168 mg/dL — ABNORMAL HIGH (ref 70–99)
Glucose-Capillary: 128 mg/dL — ABNORMAL HIGH (ref 70–99)
Glucose-Capillary: 133 mg/dL — ABNORMAL HIGH (ref 70–99)
Glucose-Capillary: 170 mg/dL — ABNORMAL HIGH (ref 70–99)
Glucose-Capillary: 157 mg/dL — ABNORMAL HIGH (ref 70–99)
Glucose-Capillary: 137 mg/dL — ABNORMAL HIGH (ref 70–99)
Glucose-Capillary: 164 mg/dL — ABNORMAL HIGH (ref 70–99)
Glucose-Capillary: 183 mg/dL — ABNORMAL HIGH (ref 70–99)
Glucose-Capillary: 180 mg/dL — ABNORMAL HIGH (ref 70–99)
Glucose-Capillary: 137 mg/dL — ABNORMAL HIGH (ref 70–99)
Glucose-Capillary: 141 mg/dL — ABNORMAL HIGH (ref 70–99)
Glucose-Capillary: 149 mg/dL — ABNORMAL HIGH (ref 70–99)
Glucose-Capillary: 164 mg/dL — ABNORMAL HIGH (ref 70–99)
Glucose-Capillary: 142 mg/dL — ABNORMAL HIGH (ref 70–99)

## 2022-06-04 LAB — RENAL FUNCTION PANEL
Albumin: 2.4 g/dL — ABNORMAL LOW (ref 3.5–5.0)
Albumin: 2.2 g/dL — ABNORMAL LOW (ref 3.5–5.0)
Anion gap: 6 (ref 5–15)
Anion gap: 8 (ref 5–15)
BUN: 45 mg/dL — ABNORMAL HIGH (ref 8–23)
BUN: 55 mg/dL — ABNORMAL HIGH (ref 8–23)
CO2: 26 mmol/L (ref 22–32)
CO2: 25 mmol/L (ref 22–32)
Calcium: 8 mg/dL — ABNORMAL LOW (ref 8.9–10.3)
Calcium: 7.8 mg/dL — ABNORMAL LOW (ref 8.9–10.3)
Chloride: 105 mmol/L (ref 98–111)
Chloride: 104 mmol/L (ref 98–111)
Creatinine, Ser: 1.1 mg/dL — ABNORMAL HIGH (ref 0.44–1.00)
Creatinine, Ser: 1.23 mg/dL — ABNORMAL HIGH (ref 0.44–1.00)
GFR, Estimated: 55 mL/min — ABNORMAL LOW (ref >60)
GFR, Estimated: 48 mL/min — ABNORMAL LOW (ref >60)
Glucose, Bld: 156 mg/dL — ABNORMAL HIGH (ref 70–99)
Glucose, Bld: 133 mg/dL — ABNORMAL HIGH (ref 70–99)
Phosphorus: 3.7 mg/dL (ref 2.5–4.6)
Phosphorus: 5.3 mg/dL — ABNORMAL HIGH (ref 2.5–4.6)
Potassium: 5.4 mmol/L — ABNORMAL HIGH (ref 3.5–5.1)
Potassium: 5.1 mmol/L (ref 3.5–5.1)
Sodium: 137 mmol/L (ref 135–145)
Sodium: 137 mmol/L (ref 135–145)

## 2022-06-04 LAB — CBC WITH DIFFERENTIAL/PLATELET
Abs Immature Granulocytes: 1.79 10*3/uL — ABNORMAL HIGH (ref 0.00–0.07)
Basophils Absolute: 0.1 10*3/uL (ref 0.0–0.1)
Basophils Relative: 0 %
Eosinophils Absolute: 0 10*3/uL (ref 0.0–0.5)
Eosinophils Relative: 0 %
HCT: 29 % — ABNORMAL LOW (ref 36.0–46.0)
Hemoglobin: 9.2 g/dL — ABNORMAL LOW (ref 12.0–15.0)
Immature Granulocytes: 5 %
Lymphocytes Relative: 0 %
Lymphs Abs: 0.2 10*3/uL — ABNORMAL LOW (ref 0.7–4.0)
MCH: 25.1 pg — ABNORMAL LOW (ref 26.0–34.0)
MCHC: 31.7 g/dL (ref 30.0–36.0)
MCV: 79.2 fL — ABNORMAL LOW (ref 80.0–100.0)
Monocytes Absolute: 1.7 10*3/uL — ABNORMAL HIGH (ref 0.1–1.0)
Monocytes Relative: 5 %
Neutro Abs: 32.6 10*3/uL — ABNORMAL HIGH (ref 1.7–7.7)
Neutrophils Relative %: 90 %
Platelets: 211 10*3/uL (ref 150–400)
RBC: 3.66 MIL/uL — ABNORMAL LOW (ref 3.87–5.11)
RDW: 19.4 % — ABNORMAL HIGH (ref 11.5–15.5)
WBC: 36.3 10*3/uL — ABNORMAL HIGH (ref 4.0–10.5)
nRBC: 1.2 % — ABNORMAL HIGH (ref 0.0–0.2)

## 2022-06-04 LAB — TRIGLYCERIDES: Triglycerides: 166 mg/dL — ABNORMAL HIGH (ref <150)

## 2022-06-04 LAB — MAGNESIUM: Magnesium: 2.6 mg/dL — ABNORMAL HIGH (ref 1.7–2.4)

## 2022-06-04 LAB — HEPARIN LEVEL (UNFRACTIONATED): Heparin Unfractionated: 0.51 IU/mL (ref 0.30–0.70)

## 2022-06-04 MED ORDER — PRISMASOL BGK 0/2.5 32-2.5 MEQ/L EC SOLN
Status: DC
  Filled 2022-06-04: qty 5000

## 2022-06-04 MED ORDER — PRISMASOL BGK 0/2.5 32-2.5 MEQ/L EC SOLN
Status: DC
  Filled 2022-06-04 (×26): qty 5000

## 2022-06-04 MED ORDER — PRISMASOL BGK 0/2.5 32-2.5 MEQ/L EC SOLN
Status: DC
  Filled 2022-06-04 (×9): qty 5000

## 2022-06-04 MED ORDER — PRISMASOL BGK 0/2.5 32-2.5 MEQ/L EC SOLN
Status: DC
  Filled 2022-06-04 (×7): qty 5000

## 2022-06-04 MED ORDER — "THROMBI-PAD 3""X3"" EX PADS": 1.0000 | MEDICATED_PAD | Freq: Once | CUTANEOUS | Status: DC | PRN

## 2022-06-04 MED ORDER — "THROMBI-PAD 3""X3"" EX PADS"
1.0000 | MEDICATED_PAD | Freq: Once | CUTANEOUS | Status: AC
  Administered 2022-06-04: 1 via TOPICAL
  Filled 2022-06-04: qty 1

## 2022-06-04 MED ORDER — IPRATROPIUM-ALBUTEROL 0.5-2.5 (3) MG/3ML IN SOLN: 3.0000 mL | Freq: Four times a day (QID) | RESPIRATORY_TRACT | Status: DC | PRN

## 2022-06-04 MED ORDER — IOHEXOL 300 MG/ML  SOLN
100.0000 mL | Freq: Once | INTRAMUSCULAR | Status: AC | PRN
  Administered 2022-06-04: 100 mL via INTRAVENOUS

## 2022-06-04 NOTE — Progress Notes (Signed)
Pt transported from [place] to CT2 and back on the ventilator. RT, RN and transport accompanied pt without complication.

## 2022-06-04 NOTE — Progress Notes (Signed)
ANTICOAGULATION CONSULT NOTE- follow-up  Pharmacy Consult for heparin Indication: pulmonary embolus  Allergies  Allergen Reactions   Canagliflozin Other (See Comments)    recurrent vaginitis   Glucophage [Metformin] Other (See Comments)    GI intolerance   Niaspan [Niacin] Other (See Comments)    flushing    Patient Measurements: Height: 5' 5.98" (167.6 cm) Weight: 87.4 kg (192 lb 10.9 oz) IBW/kg (Calculated) : 59.25 Heparin Dosing Weight: 79kg  Vital Signs: Temp: 97.9 F (36.6 C) (08/05 0200) Temp Source: Rectal (08/04 1600) BP: 115/49 (08/05 0200) Pulse Rate: 85 (08/05 0200)  Labs: Recent Labs    06/02/22 0500 06/02/22 1400 06/02/22 1530 06/02/22 2235 06/03/22 0415 06/03/22 0644 06/03/22 1600 06/04/22 0208  HGB 9.5*  --   --   --  9.9*  --   --  9.2*  HCT 30.1*  --   --   --  31.4*  --   --  29.0*  PLT 270  --   --   --  324  --   --  211  HEPARINUNFRC  --    < >  --  1.02*  --  0.70  --  0.51  CREATININE 1.15*  --  1.07*  --  1.12*  --  1.05*  --    < > = values in this interval not displayed.     Estimated Creatinine Clearance: 57.9 mL/min (A) (by C-G formula based on SCr of 1.05 mg/dL (H)).   Assessment: 48 yoF admitted with meningitis from OSH. Pt with ongoing fevers and worsening clinical status 7/29, RV severely down on beside ECHO so concerns for acute PE. Pt received alteplase on 7/30 for suspected saddle PE.    Heparin resumed 8/4 at 4 hrs post-trach per MD.  PM update - heparin level therapeutic at 0.51. CBC wnl. No bleed issues reported.  Goal of Therapy:  Heparin level 0.3-0.7 units/mL Monitor platelets by anticoagulation protocol: Yes   Plan:  Continue heparin at 1300 units/hr Monitor daily heparin level/CBC, s/sx bleeding   Arturo Morton, PharmD, BCPS Please check AMION for all [place] contact numbers Clinical Pharmacist 06/04/2022 2:48 AM

## 2022-06-04 NOTE — Progress Notes (Signed)
[facility]    Date of Admission:  05/24/2022   Total days of antibiotics 13/day 9 meropenem           ID: Valerie Cline is a 66 y.o. female immunocompromised host with small lymphocytis b cell NHL on rituximab, and IVIG (04/05/22), enterovirus viral meningoencephalitis c/b seizures continues on EEG monitoring, remains obtunded with  sudden shock, multi-organ failure s/p tracheostomy. On IV heparin for PE, and pressor support Principal Problem:   Meningitis Active Problems:   Severe sepsis (HCC)   Acute metabolic encephalopathy   Acute respiratory failure with hypoxia (HCC)   Multifocal pneumonia   Meningioma (HCC)   Cervical myelopathy (HCC)   UTI (urinary tract infection)   Pressure injury of skin    Subjective: Afebrile, WBC trending down with steroids taper, pressors at lower dose. Off of CRRT since returning from CT radiology  Imaging of chest/abd/pelvis- revealed PE, infiltrate c/w -pna  Medications:   Chlorhexidine Gluconate Cloth  6 each Topical Daily   docusate  100 mg Per Tube BID   fentaNYL (SUBLIMAZE) injection  200 mcg Intravenous Once   methylPREDNISolone (SOLU-MEDROL) injection  125 mg Intravenous Q12H   midazolam  5 mg Intravenous Once   multivitamin  1 tablet Per Tube QHS   mouth rinse  15 mL Mouth Rinse Q2H   pantoprazole sodium  40 mg Per Tube Daily   perampanel  4 mg Per Tube Q1200   PHENObarbital  65 mg Intravenous BID   polyethylene glycol  17 g Per Tube Daily   sodium chloride flush  10-40 mL Intracatheter Q12H   sodium chloride flush  10-40 mL Intracatheter Q12H    Objective: Vital signs in last 24 hours: Temp:  [96.6 F (35.9 C)-99 F (37.2 C)] 98.8 F (37.1 C) (08/05 1115) Pulse Rate:  [76-108] 96 (08/05 1115) Resp:  [22-29] 29 (08/05 1115) BP: (101-138)/(43-60) 129/59 (08/05 1100) SpO2:  [88 %-100 %] 95 % (08/05 1115) Arterial Line BP: (102-174)/(39-87) 149/43 (08/05 1115) FiO2 (%):  [40 %] 40 % (08/05  0800)  Physical Exam  Constitutional:  obtunded, non-responsive No distress.  Mouth/Throat: Oropharynx is clear and moist. No oropharyngeal exudate.  Cardiovascular: Normal rate, regular rhythm and normal heart sounds. Exam reveals no gallop and no friction rub.  No murmur heard.  Pulmonary/Chest: Effort normal and breath sounds normal. No respiratory distress.  has no wheezes.  Neck = lines look intact Abdominal: Soft. Bowel sounds are normal.  exhibits no distension. There is no tenderness.  Lymphadenopathy: no cervical adenopathy. No axillary adenopathy Neurological: alert and oriented to person, place, and time.  Skin: Skin is warm and dry. No rash noted. No erythema.  Psychiatric: a normal mood and affect.  behavior is normal.    Lab Results Recent Labs    06/03/22 0415 06/03/22 1600 06/04/22 0208  WBC 61.3*  --  36.3*  HGB 9.9*  --  9.2*  HCT 31.4*  --  29.0*  NA 135 136 137  K 5.3* 5.4* 5.4*  CL 103 103 105  CO2 '23 25 26  '$ BUN 40* 40* 45*  CREATININE 1.12* 1.05* 1.10*   Liver Panel Recent Labs    06/02/22 0500 06/02/22 1530 06/03/22 1600 06/04/22 0208  PROT 6.0*  --   --   --   ALBUMIN 2.4*  2.4*   < > 2.4* 2.4*  AST 201*  --   --   --   ALT 1,482*  --   --   --  ALKPHOS 164*  --   --   --   BILITOT 1.3*  --   --   --   BILIDIR 0.7*  --   --   --   IBILI 0.6  --   --   --    < > = values in this interval not displayed.   Sedimentation Rate No results for input(s): "ESRSEDRATE" in the last 72 hours. C-Reactive Protein No results for input(s): "CRP" in the last 72 hours.  Microbiology: reviewed Studies/Results: CT CHEST ABDOMEN PELVIS W CONTRAST  Result Date: 06/04/2022 CLINICAL DATA:  Abdominal pain. EXAM: CT CHEST, ABDOMEN, AND PELVIS WITH CONTRAST TECHNIQUE: Multidetector CT imaging of the chest, abdomen and pelvis was performed following the standard protocol during bolus administration of intravenous contrast. RADIATION DOSE REDUCTION: This exam  was performed according to the departmental dose-optimization program which includes automated exposure control, adjustment of the mA and/or kV according to patient size and/or use of iterative reconstruction technique. CONTRAST:  135m OMNIPAQUE IOHEXOL 300 MG/ML  SOLN COMPARISON:  09/03/2020. Chest radiograph an abdomen radiographs, 06/03/2022. FINDINGS: CT CHEST FINDINGS Cardiovascular: Heart normal in size. No pericardial effusion. Three-vessel coronary artery calcifications. Great vessels are normal in caliber. Aortic atherosclerosis. No dissection. Vague filling defect in the peripheral right pulmonary artery extending to the right lower lobe pulmonary artery and segmental branches. This could reflect pulmonary emboli, but is equivocal Mediastinum/Nodes: Stable, previously assessed, left thyroid gland nodule/enlargement. No mediastinal or hilar masses. No enlarged lymph nodes. Endotracheal tube tip projects 4.7 cm above the carina. Nasal/orogastric tube tip lies in the distal stomach. Lungs/Pleura: Patchy right lower lobe consolidation. Mild associated bronchiectasis. Milder opacity in the dependent posteromedial left lower lobe, consistent with atelectasis. There is atelectasis or scarring in dependent right middle lobe and left upper lobe lingula, with associated bronchiectasis. There are additional bilateral areas of ground-glass opacity as well as linear opacities. These findings are new compared to the prior chest CT from 09/03/2020. Trace, right greater than left, pleural effusions. No pneumothorax. Musculoskeletal: No fracture or acute finding.  No bone lesion. CT ABDOMEN PELVIS FINDINGS Hepatobiliary: Normal liver. Small dependent gallstone. No acute cholecystitis. No bile duct dilation. Pancreas: Unremarkable. No pancreatic ductal dilatation or surrounding inflammatory changes. Spleen: Normal in size without focal abnormality. Adrenals/Urinary Tract: No adrenal masses. Kidneys normal in overall size,  orientation and position. No renal mass or stone. No hydronephrosis. Normal ureters. Bladder decompressed with a Foley catheter. Stomach/Bowel: Normal stomach. Decompressed small bowel. Scattered colonic air-fluid levels. No bowel wall thickening. No inflammation. Rectal tube in place. Vascular/Lymphatic: Aortic atherosclerosis. No aneurysm. No enlarged lymph nodes. Reproductive: Uterus and bilateral adnexa are unremarkable. Other: Small amount of ascites. Subcutaneous edema along the lower abdomen and pelvis. Musculoskeletal: Chronic bilateral pars defects at L5-S1 with a grade 2 to grade 3 anterolysis. No acute fracture. Status post posterior fusion, L1, L2 and L3. This is new compared to the prior CT. No bone lesion. IMPRESSION: 1. Possible pulmonary emboli involving the right peripheral pulmonary artery/right lower lobe pulmonary arteries. Consider follow-up CTA chest for further assessment if this patient can tolerate a repeat contrast dose. 2. Bilateral lung opacities, most confluent in the right lower lobe, suspected to be multifocal pneumonia. 3. Trace bilateral pleural effusions. 4. Aortic atherosclerosis and 3 vessel coronary artery calcifications. 5. Small amount of ascites. 6. No acute abnormality within the abdomen or pelvis. Electronically Signed   By: DLajean ManesM.D.   On: 06/04/2022 10:28   DG Abd Portable  1V  Result Date: 06/03/2022 CLINICAL DATA:  NGT EXAM: PORTABLE ABDOMEN - 1 VIEW COMPARISON:  None Available. FINDINGS: Nonobstructive pattern of included bowel gas. Non weighted enteric feeding tube is positioned with tip below the diaphragm, in the vicinity of the pylorus or duodenal bulb. IMPRESSION: Non weighted enteric feeding tube is positioned with tip below the diaphragm, in the vicinity of the pylorus or duodenal bulb. Consider advancement if post pyloric placement is desired. Electronically Signed   By: Delanna Ahmadi M.D.   On: 06/03/2022 15:26   DG Chest Port 1 View  Result  Date: 06/03/2022 CLINICAL DATA:  Status post tracheostomy. EXAM: PORTABLE CHEST 1 VIEW COMPARISON:  05/29/2022 FINDINGS: Removal of endotracheal tube with placement of tracheostomy since the prior chest x-ray. Tracheostomy appearance by x-ray appears appropriate. Non tunneled large bore central venous catheter remains in place with the catheter tip in the SVC. Improved aeration of the right lung with some residual atelectasis or infiltrate remaining at the right lung base. No pulmonary edema or pleural fluid identified. No pneumothorax. IMPRESSION: Tracheostomy appears appropriate by chest x-ray. Improved aeration of the right lung with residual atelectasis or infiltrate at the right lung base. Electronically Signed   By: Aletta Edouard M.D.   On: 06/03/2022 15:23     Assessment/Plan: Enteroviral meningitis = continue with supportive care  Leukocytosis = will continue to monitor as it trends down with steroid taper. No new sites of infection for the time being  Pneumonia = currently on meropenem will likely address end date in next 1-2 days as long as she is no longer requiring pressors.  PE = on anticoagulation  [facility] Infect[facility] Pager: [phone]  06/04/2022, 11:37 AM

## 2022-06-04 NOTE — Progress Notes (Signed)
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   Discharge summary reviewed: She initially presented to OSH on 05/20/22 for left upper extremity tremors. She reported recent hand foot any mouth disease contracted from her grandchild that left her weak and tired prior to this admission. Neurology work-up including MRI, EEG negative. MRI/MRA neg for acute infarct, 13 mm focus in left frontal lobe representing artifact vs small calcified meningioma, chronic small vessel ischemic changes, cerebral atrophy, high-grade stenosis in bilateral ICA, focal stenosis in left ACA and intracranial right vertebral artery. MRI of C-spine showing C4-5 cord compression from advanced disc degeneration with posterior endplate ridging.  Showing left foraminal impingement at the same level.  Also showing C5-6 and C6-7 ADCF with solid arthrodesis.  Neurologist at [facility] had recommended neurosurgical evaluation.  She developed fevers and transferred to ICU concerning for meningitis and started on Vanc, rocephin, ampicillin and acyclovir. LP was performed on 7/24 with WBC 55 RBC 2. Pending herpes, enterovirus, ehrlichia. CT A/P showed bilateral ground glass and lung opacities concerning for multifocal pneumonia.   PCCM consulted for decreased responsiveness and hypoxemia on NRB. On exam obtunded and only able to say name. O2 sats in the 70s on NRB. Transferred to the ICU for emergent intubation   Pertinent  Medical History  As above  Significant Hospital Events: Including  procedures, antibiotic start and stop dates in addition to other pertinent events   7/24 Admitted to [facility] from [place]. Transferred to PCCM when found in respiratory failure 7/25 intubated, bronched, art line placed, PICC placed.  Neuro and ID consulted. Started decadron  7/26 decadron dc/d 7/27 felt most consistent with viral meningoencephalitis  7/29-7/30 sudden onset multiorgan failure 8/4 trach  Interim History / Subjective:  Remain afebrile Remains on low-dose Levophed at 2 mics White count started trending down, now 36 K  Remains on CRRT and continuous EEG Tracheostomy was done yesterday  Objective   Blood pressure (!) 109/46, pulse 87, temperature (!) 97.2 F (36.2 C), resp. rate (!) 28, height 5' 5.98" (1.676 m), weight 87.4 kg, SpO2 100 %.    Vent Mode: PRVC FiO2 (%):  [40 %] 40 % Set Rate:  [28 bmp] 28 bmp Vt Set:  [480 mL-4870 mL] 480 mL PEEP:  [8 cmH20] 8 cmH20 Plateau Pressure:  [19 cmH20-22 cmH20] 21 cmH20   Intake/Output Summary (Last 24 hours) at 06/04/2022 0846 Last data filed at 06/04/2022 0800 Gross per 24 hour  Intake 2749.72 ml  Output 4113 ml  Net -1363.28 ml   Filed Weights   06/01/22 0439 06/02/22 0500 06/03/22 0340  Weight: 91 kg 88.9 kg 87.4 kg   Physical Exam: General: Crtitically ill-appearing female, orally intubated HEENT: [place]/AT, eyes anicteric.  ETT and OGT in place Neuro: Sedated, not following commands.  Eyes are closed.  Pupils 3 mm bilateral none reactive to light, off and gag, no movement to painful stimuli Chest: Coarse breath sounds, no wheezes or rhonchi Heart: Regular rate and rhythm, no murmurs or gallops Abdomen: Soft, nontender, nondistended, bowel sounds present Skin: No rash  Resolved  Hospital Problem list   Metabolic acidosis Hypertriglycerides being off propofol infusion Severe metabolic acidosis UTI  Assessment & Plan:  Acute infectious encephalopathy in setting of viral meningoencephalitis  All sedation has been stopped  now, including fentanyl RASS goal 0/-1  Acute hypoxemic/hypercapnic respiratory failure 2/2 multilobar PNA s/p tracheostomy Trach aspirate is grew oral flora Appreciate infectious disease follow-up White count started trending down, now 36 K, likely due to leukemoid reaction rather than active infection as patient is hemodynamically stable with low-dose vasopressor Continue meropenem We will obtain CT abdomen pelvis to rule out intra-abdominal process/source of infection Continue lung protective ventilation Not ready for SBT due to deep sedation Patient tolerated tracheostomy very well, no signs of bleeding  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 Critical cervical myopathy s/p ACDF C5-C7 Continue droplet precautions Appreciate ID and neurology input Continue IV steroid, will be tapered probably next week Enterovirus CSF PCR is positive Cryptococcal antigen is negative Follow-up EBV, VZV, CMV and JC virus PCR, in process  Hypogammaglobinemia Patient is on Rituxan as an outpatient Her immunoglobulin levels were low Received IVIG  Status epilepticus Status epilepticus have improved but she still have periodic discharges EEG read is pending Patient is off propofol and Versed infusions Continue perampanel Keppra, Vimpat and phenobarbital Remain on continuous EEG Appreciate neurology follow-up  DM2 with severe hyperglycemia  She has labile blood sugars, currently continue with insulin infusion, once stabilized we will try to switch her to long-acting  Small lymphocytic B cell non-hodgkin lymphoma On maintenance rituximab (last received April 05 2022)  Follows Dr. Marin Olp Oncology is following Immunoglobulins levels are are low, received replacement  Sudden onset shock, acute renal failure, acute liver failure, RV dilation Suspected saddle pulmonary embolism Patient received tPA, with improvement  in hemodynamics Continue IV heparin Shock has improved Remain on on CRRT, will speak with nephrology stop CRRT today Nephrology is following Repeat LFTs continue to trend down  Best Practice (right click and "Reselect all SmartList Selections" daily)   Diet/type: Tube feeds DVT prophylaxis: heparin gtt GI prophylaxis: PPI Lines: PICC  Foley: Remove Foley Code Status:  full code Last date of multidisciplinary goals of care discussion 8/3- Family updated at bedside, please see iPal note  Total critical care time: 34 minutes  Performed by: Jacky Kindle   Critical care time was exclusive of separately billable procedures and treating other patients.   Critical care was necessary to treat or prevent imminent or life-threatening deterioration.   Critical care was time spent personally by me on the following activities: development of treatment plan with patient and/or surrogate as well as nursing, discussions with consultants, evaluation of patient's response to treatment, examination of patient, obtaining history from patient or surrogate, ordering and performing treatments and interventions, ordering and review of laboratory studies, ordering and review of radiographic studies, pulse oximetry and re-evaluation of patient's condition.   Jacky Kindle, MD [place] [facility] See Amion for pager If no response to pager, please call [phone] until 7pm After 7pm, Please call E-link [phone]

## 2022-06-04 NOTE — Progress Notes (Signed)
Pharmacy medication waste.   Patient on 2H rm25 was previously on midazolam gtt, concentration was '250mg'$ /268m and prepared by pharmacy. [facility] on 06/03/22 @ 1415. [place] was d[place] and [place]   Amount left in IV bag was 130 ml ('130mg'$ ), 2nd nurse witness verified by KBeatriz Stallion RN.  Proper disposal in stericycle bin.

## 2022-06-04 NOTE — Progress Notes (Signed)
OT Cancellation Note  Patient Details Name: Valerie Cline MRN: [id number] DOB: 08-12-1956   Cancelled Treatment:    Reason Eval/Treat Not Completed: Patient at procedure or test/ unavailable.  Patient getting ready to be transported to CT.  Will continue efforts as schedule allows.    Areen Trautner D Sarayu Prevost 06/04/2022, 9:10 AM 06/04/2022  RP, OTR/L  Acute Rehabilitation Services  Office:  [phone]

## 2022-06-04 NOTE — Procedures (Signed)
Patient Name: Valerie Cline  MRN: [id number]  Epilepsy Attending: Lora Havens  Referring Physician/Provider: Greta Doom, MD   Duration: 06/03/2022 1014 to 06/04/2022 1014   Patient history: 66yo F with ams. EEG to evaluate for seizure   Level of alertness: comatose   AEDs during EEG study: versed, Keppra, Vimpat, perampanel, Phenobarb   Technical aspects: This EEG study was done with scalp electrodes positioned according to the 10-20 International system of electrode placement. Electrical activity was acquired at a sampling rate of '500Hz'$  and reviewed with a high frequency filter of '70Hz'$  and a low frequency filter of '1Hz'$ . EEG data were recorded continuously and digitally stored.    Description: EEG showed near continuous polymorphic sharply contoured 3 to 7 Hz theta-delta slowing admixed with intermittent 1 to 3 seconds of generalized EEG suppression. Polyspikes were also noted in left posterior quadrant which at times appeared quasiperiodic at 1 Hz.     ABNORMALITY -Polyspikes, left posterior quadrant -Continuous slow, generalized   IMPRESSION: This study showed evidence of epileptogenicity in left posterior quadrant with high risk for seizure recurrence.  Additionally there was severe diffuse encephalopathy. No definite seizures were seen during this study.     Kyleah Pensabene [name]

## 2022-06-04 NOTE — Progress Notes (Signed)
[facility] Progress Note    66 y.o. year-old w/ hx HTN, hL, DM, CAD, carotid stenosis and hx NHL last chemoRx June 2023 presented to OSH on 7/21 for LUE tremors w/ recent hand-foot-and mouth disease. Work-up showed neg MRI of brain but MRI of C-spine showed sig cord compression at C4-5. Decompensated w/ high givers and AMS, concerning for meningitis and was moved to ICU. LP showed wbc 55 and CT showed multifocal PNA. Pt transferred to [facility] on 7/24. On 7/25 pt become poorly responsiveness and was intubated and moved to ICU. Fevers continued and pt developed hypotension/ shock on 7/29 and started on vaso and levo gtts.  Creat 0.8 on admit and cont to rise with hyperkalemia as well -> CRRT.    Assessment/ Plan:   AKI - b/l creat 0.8 on admission. Creat rising now in setting of new onset shock. Pt admitted for viral meningoencephalitis complicated by critical cervical myopathy and now resp failure w/ multifocal PNA. Pt has lymphoma receiving active chemoRx (rituximab) w/ last dose in June 2023. AKI due to shock which started in last 24 hrs abruptly. CCM suspecting acute PE, s/p TPA and now on IV heparin. K+ up to 6.8 -> CCRT 7/30 w/ all 2K fluids -> 4K now. No signs of renal recovery. Very minimal UOP.  Confirmed with bladder scan 48m which is not unexpected while on CRRT.  Will restart CRRT with 2K fluids and adjust as needed. K currently 5.4.  On low dose Levophed 1 mcg (primarily bec of the heavy sedation) and had been tolerating 100 ml/hr (50-100). Now 6112 mL pos during hospitalization.    Volume - mod edema on exam, I/O + 6.1 L since admit. Tolerating 50-1063mhr. Shock - maybe from multilobar PNA + viral menigoencephalitis, as above, on vasopressor support,  H/o lymphoma - getting active chemoRx w/ last dose rituxan in June 2023 AHRF - on vent, multifocal PNA on broad spec IV abx per CCM Status epilepticus on propofol AMS - in setting of viral meningoencephalitis  Cervical  myelopathy  DM2 - on insulin  Subjective:   Daughter bedside; CRRT off for study. Levophed (low dose 72m672m.   Objective:   BP (!) 129/59   Pulse 96   Temp 98.8 F (37.1 C)   Resp (!) 29   Ht 5' 5.98" (1.676 m)   Wt 87.4 kg   SpO2 95%   BMI 31.12 kg/m   Intake/Output Summary (Last 24 hours) at 06/04/2022 1204 Last data filed at 06/04/2022 1115 Gross per 24 hour  Intake 2301.42 ml  Output 3439 ml  Net -1137.58 ml   Weight change:   Physical Exam: Gen on vent, sedated No rash, cyanosis or gangrene Sclera anicteric, throat w/ ETT No jvd or bruits Chest clear anterior/ latera RRR no RG Abd soft ntnd no mass or ascites +bs GU foley draining clear light amber urine Ext tr  bilat UE > LE edema, no wounds or ulcers Neuro is on vent, sedated  Imaging: CT CHEST ABDOMEN PELVIS W CONTRAST  Result Date: 06/04/2022 CLINICAL DATA:  Abdominal pain. EXAM: CT CHEST, ABDOMEN, AND PELVIS WITH CONTRAST TECHNIQUE: Multidetector CT imaging of the chest, abdomen and pelvis was performed following the standard protocol during bolus administration of intravenous contrast. RADIATION DOSE REDUCTION: This exam was performed according to the departmental dose-optimization program which includes automated exposure control, adjustment of the mA and/or kV according to patient size and/or use of iterative reconstruction technique. CONTRAST:  100m76mNIPAQUE IOHEXOL 300 MG/ML  SOLN COMPARISON:  09/03/2020. Chest radiograph an abdomen radiographs, 06/03/2022. FINDINGS: CT CHEST FINDINGS Cardiovascular: Heart normal in size. No pericardial effusion. Three-vessel coronary artery calcifications. Great vessels are normal in caliber. Aortic atherosclerosis. No dissection. Vague filling defect in the peripheral right pulmonary artery extending to the right lower lobe pulmonary artery and segmental branches. This could reflect pulmonary emboli, but is equivocal Mediastinum/Nodes: Stable, previously assessed, left thyroid  gland nodule/enlargement. No mediastinal or hilar masses. No enlarged lymph nodes. Endotracheal tube tip projects 4.7 cm above the carina. Nasal/orogastric tube tip lies in the distal stomach. Lungs/Pleura: Patchy right lower lobe consolidation. Mild associated bronchiectasis. Milder opacity in the dependent posteromedial left lower lobe, consistent with atelectasis. There is atelectasis or scarring in dependent right middle lobe and left upper lobe lingula, with associated bronchiectasis. There are additional bilateral areas of ground-glass opacity as well as linear opacities. These findings are new compared to the prior chest CT from 09/03/2020. Trace, right greater than left, pleural effusions. No pneumothorax. Musculoskeletal: No fracture or acute finding.  No bone lesion. CT ABDOMEN PELVIS FINDINGS Hepatobiliary: Normal liver. Small dependent gallstone. No acute cholecystitis. No bile duct dilation. Pancreas: Unremarkable. No pancreatic ductal dilatation or surrounding inflammatory changes. Spleen: Normal in size without focal abnormality. Adrenals/Urinary Tract: No adrenal masses. Kidneys normal in overall size, orientation and position. No renal mass or stone. No hydronephrosis. Normal ureters. Bladder decompressed with a Foley catheter. Stomach/Bowel: Normal stomach. Decompressed small bowel. Scattered colonic air-fluid levels. No bowel wall thickening. No inflammation. Rectal tube in place. Vascular/Lymphatic: Aortic atherosclerosis. No aneurysm. No enlarged lymph nodes. Reproductive: Uterus and bilateral adnexa are unremarkable. Other: Small amount of ascites. Subcutaneous edema along the lower abdomen and pelvis. Musculoskeletal: Chronic bilateral pars defects at L5-S1 with a grade 2 to grade 3 anterolysis. No acute fracture. Status post posterior fusion, L1, L2 and L3. This is new compared to the prior CT. No bone lesion. IMPRESSION: 1. Possible pulmonary emboli involving the right peripheral pulmonary  artery/right lower lobe pulmonary arteries. Consider follow-up CTA chest for further assessment if this patient can tolerate a repeat contrast dose. 2. Bilateral lung opacities, most confluent in the right lower lobe, suspected to be multifocal pneumonia. 3. Trace bilateral pleural effusions. 4. Aortic atherosclerosis and 3 vessel coronary artery calcifications. 5. Small amount of ascites. 6. No acute abnormality within the abdomen or pelvis. Electronically Signed   By: Lajean Manes M.D.   On: 06/04/2022 10:28   DG Abd Portable 1V  Result Date: 06/03/2022 CLINICAL DATA:  NGT EXAM: PORTABLE ABDOMEN - 1 VIEW COMPARISON:  None Available. FINDINGS: Nonobstructive pattern of included bowel gas. Non weighted enteric feeding tube is positioned with tip below the diaphragm, in the vicinity of the pylorus or duodenal bulb. IMPRESSION: Non weighted enteric feeding tube is positioned with tip below the diaphragm, in the vicinity of the pylorus or duodenal bulb. Consider advancement if post pyloric placement is desired. Electronically Signed   By: Delanna Ahmadi M.D.   On: 06/03/2022 15:26   DG Chest Port 1 View  Result Date: 06/03/2022 CLINICAL DATA:  Status post tracheostomy. EXAM: PORTABLE CHEST 1 VIEW COMPARISON:  05/29/2022 FINDINGS: Removal of endotracheal tube with placement of tracheostomy since the prior chest x-ray. Tracheostomy appearance by x-ray appears appropriate. Non tunneled large bore central venous catheter remains in place with the catheter tip in the SVC. Improved aeration of the right lung with some residual atelectasis or infiltrate remaining at the right lung base. No pulmonary edema or  pleural fluid identified. No pneumothorax. IMPRESSION: Tracheostomy appears appropriate by chest x-ray. Improved aeration of the right lung with residual atelectasis or infiltrate at the right lung base. Electronically Signed   By: Aletta Edouard M.D.   On: 06/03/2022 15:23    Labs: BMET Recent Labs  Lab  06/01/22 0400 06/01/22 1532 06/02/22 0500 06/02/22 1530 06/03/22 0415 06/03/22 1600 06/04/22 0208  NA 137 134* 135 136 135 136 137  K 4.5 4.2 4.5 5.3* 5.3* 5.4* 5.4*  CL 100 103 102 104 103 103 105  CO2 '24 23 23 25 23 25 26  '$ GLUCOSE 427* 256* 186* 149* 193* 240* 156*  BUN 46* 43* 40* 41* 40* 40* 45*  CREATININE 1.18* 1.33* 1.15* 1.07* 1.12* 1.05* 1.10*  CALCIUM 7.8* 7.4* 7.6* 7.7* 7.9* 8.0* 8.0*  PHOS 3.7 3.9 3.7 3.7 4.2 4.1 3.7   CBC Recent Labs  Lab 06/01/22 0400 06/02/22 0500 06/03/22 0415 06/04/22 0208  WBC 29.5* 46.7* 61.3* 36.3*  NEUTROABS  --   --  57.6* 32.6*  HGB 9.4* 9.5* 9.9* 9.2*  HCT 29.8* 30.1* 31.4* 29.0*  MCV 79.5* 78.4* 78.7* 79.2*  PLT 294 270 324 211    Medications:     Chlorhexidine Gluconate Cloth  6 each Topical Daily   docusate  100 mg Per Tube BID   fentaNYL (SUBLIMAZE) injection  200 mcg Intravenous Once   methylPREDNISolone (SOLU-MEDROL) injection  125 mg Intravenous Q12H   midazolam  5 mg Intravenous Once   multivitamin  1 tablet Per Tube QHS   mouth rinse  15 mL Mouth Rinse Q2H   pantoprazole sodium  40 mg Per Tube Daily   perampanel  4 mg Per Tube Q1200   PHENObarbital  65 mg Intravenous BID   polyethylene glycol  17 g Per Tube Daily   sodium chloride flush  10-40 mL Intracatheter Q12H   sodium chloride flush  10-40 mL Intracatheter Q12H      Otelia Santee, MD 06/04/2022, 12:04 PM

## 2022-06-04 NOTE — Progress Notes (Signed)
PT Cancellation Note  Patient Details Name: Valerie Cline MRN: [id number] DOB: Jul 01, 1956   Cancelled Treatment:    Reason Eval/Treat Not Completed: Patient not medically ready, remains unresponsive. RN notes weaning all sedation today; RN performing passive ROM. Will check back Monday for appropriateness of PT Evaluation.  Mabeline Caras, PT, DPT Acute Rehabilitation Services  Personal: [name] Office: [place] 06/04/2022, 12:07 PM

## 2022-06-05 DIAGNOSIS — G9341 Metabolic encephalopathy: Secondary | ICD-10-CM | POA: Diagnosis not present

## 2022-06-05 DIAGNOSIS — J9601 Acute respiratory failure with hypoxia: Secondary | ICD-10-CM | POA: Diagnosis not present

## 2022-06-05 DIAGNOSIS — R4182 Altered mental status, unspecified: Secondary | ICD-10-CM | POA: Diagnosis not present

## 2022-06-05 DIAGNOSIS — G039 Meningitis, unspecified: Secondary | ICD-10-CM | POA: Diagnosis not present

## 2022-06-05 DIAGNOSIS — J189 Pneumonia, unspecified organism: Secondary | ICD-10-CM | POA: Diagnosis not present

## 2022-06-05 DIAGNOSIS — G40901 Epilepsy, unspecified, not intractable, with status epilepticus: Secondary | ICD-10-CM | POA: Diagnosis not present

## 2022-06-05 LAB — GLUCOSE, CAPILLARY
Glucose-Capillary: 131 mg/dL — ABNORMAL HIGH (ref 70–99)
Glucose-Capillary: 142 mg/dL — ABNORMAL HIGH (ref 70–99)
Glucose-Capillary: 143 mg/dL — ABNORMAL HIGH (ref 70–99)
Glucose-Capillary: 148 mg/dL — ABNORMAL HIGH (ref 70–99)
Glucose-Capillary: 148 mg/dL — ABNORMAL HIGH (ref 70–99)
Glucose-Capillary: 149 mg/dL — ABNORMAL HIGH (ref 70–99)
Glucose-Capillary: 151 mg/dL — ABNORMAL HIGH (ref 70–99)
Glucose-Capillary: 155 mg/dL — ABNORMAL HIGH (ref 70–99)
Glucose-Capillary: 156 mg/dL — ABNORMAL HIGH (ref 70–99)
Glucose-Capillary: 158 mg/dL — ABNORMAL HIGH (ref 70–99)
Glucose-Capillary: 162 mg/dL — ABNORMAL HIGH (ref 70–99)
Glucose-Capillary: 167 mg/dL — ABNORMAL HIGH (ref 70–99)
Glucose-Capillary: 167 mg/dL — ABNORMAL HIGH (ref 70–99)
Glucose-Capillary: 176 mg/dL — ABNORMAL HIGH (ref 70–99)

## 2022-06-05 LAB — CBC WITH DIFFERENTIAL/PLATELET
Abs Immature Granulocytes: 1 10*3/uL — ABNORMAL HIGH (ref 0.00–0.07)
Basophils Absolute: 0.1 10*3/uL (ref 0.0–0.1)
Basophils Relative: 0 %
Eosinophils Absolute: 0 10*3/uL (ref 0.0–0.5)
Eosinophils Relative: 0 %
HCT: 27 % — ABNORMAL LOW (ref 36.0–46.0)
Hemoglobin: 8.6 g/dL — ABNORMAL LOW (ref 12.0–15.0)
Immature Granulocytes: 3 %
Lymphocytes Relative: 1 %
Lymphs Abs: 0.3 10*3/uL — ABNORMAL LOW (ref 0.7–4.0)
MCH: 25.1 pg — ABNORMAL LOW (ref 26.0–34.0)
MCHC: 31.9 g/dL (ref 30.0–36.0)
MCV: 78.7 fL — ABNORMAL LOW (ref 80.0–100.0)
Monocytes Absolute: 1.3 10*3/uL — ABNORMAL HIGH (ref 0.1–1.0)
Monocytes Relative: 4 %
Neutro Abs: 27.6 10*3/uL — ABNORMAL HIGH (ref 1.7–7.7)
Neutrophils Relative %: 92 %
Platelets: 189 10*3/uL (ref 150–400)
RBC: 3.43 MIL/uL — ABNORMAL LOW (ref 3.87–5.11)
RDW: 19.9 % — ABNORMAL HIGH (ref 11.5–15.5)
WBC: 30.3 10*3/uL — ABNORMAL HIGH (ref 4.0–10.5)
nRBC: 0.7 % — ABNORMAL HIGH (ref 0.0–0.2)

## 2022-06-05 LAB — RENAL FUNCTION PANEL
Albumin: 2.2 g/dL — ABNORMAL LOW (ref 3.5–5.0)
Albumin: 2.3 g/dL — ABNORMAL LOW (ref 3.5–5.0)
Anion gap: 8 (ref 5–15)
Anion gap: 8 (ref 5–15)
BUN: 50 mg/dL — ABNORMAL HIGH (ref 8–23)
BUN: 47 mg/dL — ABNORMAL HIGH (ref 8–23)
CO2: 25 mmol/L (ref 22–32)
CO2: 25 mmol/L (ref 22–32)
Calcium: 7.7 mg/dL — ABNORMAL LOW (ref 8.9–10.3)
Calcium: 7.8 mg/dL — ABNORMAL LOW (ref 8.9–10.3)
Chloride: 103 mmol/L (ref 98–111)
Chloride: 103 mmol/L (ref 98–111)
Creatinine, Ser: 1.01 mg/dL — ABNORMAL HIGH (ref 0.44–1.00)
Creatinine, Ser: 0.95 mg/dL (ref 0.44–1.00)
GFR, Estimated: >60 mL/min (ref >60)
GFR, Estimated: >60 mL/min (ref >60)
Glucose, Bld: 180 mg/dL — ABNORMAL HIGH (ref 70–99)
Glucose, Bld: 159 mg/dL — ABNORMAL HIGH (ref 70–99)
Phosphorus: 4.3 mg/dL (ref 2.5–4.6)
Phosphorus: 4.4 mg/dL (ref 2.5–4.6)
Potassium: 4.8 mmol/L (ref 3.5–5.1)
Potassium: 4.3 mmol/L (ref 3.5–5.1)
Sodium: 136 mmol/L (ref 135–145)
Sodium: 136 mmol/L (ref 135–145)

## 2022-06-05 LAB — HEPARIN LEVEL (UNFRACTIONATED)
Heparin Unfractionated: 0.65 IU/mL (ref 0.30–0.70)
Heparin Unfractionated: 0.67 IU/mL (ref 0.30–0.70)

## 2022-06-05 LAB — MAGNESIUM: Magnesium: 2.5 mg/dL — ABNORMAL HIGH (ref 1.7–2.4)

## 2022-06-05 LAB — PHENOBARBITAL LEVEL: Phenobarbital: <5 ug/mL — ABNORMAL LOW (ref 15.0–40.0)

## 2022-06-05 MED ORDER — LIDOCAINE-EPINEPHRINE (PF) 2 %-1:200000 IJ SOLN: 10.0000 mL | Freq: Once | INTRAMUSCULAR | Status: AC

## 2022-06-05 MED ORDER — PREDNISONE 20 MG PO TABS
80.0000 mg | ORAL_TABLET | Freq: Every day | ORAL | Status: AC
Start: 2022-06-07 — End: 2022-06-08
  Administered 2022-06-07 – 2022-06-08 (×2): 80 mg
  Filled 2022-06-05 (×2): qty 4

## 2022-06-05 MED ORDER — METHYLPREDNISOLONE SODIUM SUCC 125 MG IJ SOLR
125.0000 mg | INTRAMUSCULAR | Status: AC
  Administered 2022-06-06: 125 mg via INTRAVENOUS
  Filled 2022-06-05: qty 2

## 2022-06-05 MED ORDER — PREDNISONE 20 MG PO TABS
80.0000 mg | ORAL_TABLET | Freq: Every day | ORAL | Status: DC
Start: 2022-06-08 — End: 2022-06-05

## 2022-06-05 MED ORDER — PREDNISONE 20 MG PO TABS: 20.0000 mg | ORAL_TABLET | Freq: Every day | ORAL | Status: DC

## 2022-06-05 MED ORDER — THROMBI-PAD 3"X3" EX PADS
1.0000 | MEDICATED_PAD | Freq: Once | CUTANEOUS | Status: AC
Start: 2022-06-05 — End: 2022-06-05
  Administered 2022-06-05: 1 via TOPICAL
  Filled 2022-06-05: qty 1

## 2022-06-05 MED ORDER — PREDNISONE 20 MG PO TABS
20.0000 mg | ORAL_TABLET | Freq: Every day | ORAL | Status: AC
  Administered 2022-06-11 – 2022-06-12 (×2): 20 mg
  Filled 2022-06-05 (×2): qty 1

## 2022-06-05 MED ORDER — LIDOCAINE-EPINEPHRINE (PF) 2 %-1:200000 IJ SOLN
INTRAMUSCULAR | Status: AC
  Administered 2022-06-05: 10 mL via INTRADERMAL
  Filled 2022-06-05: qty 20

## 2022-06-05 MED ORDER — PREDNISONE 20 MG PO TABS: 40.0000 mg | ORAL_TABLET | Freq: Every day | ORAL | Status: DC

## 2022-06-05 MED ORDER — "THROMBI-PAD 3""X3"" EX PADS"
1.0000 | MEDICATED_PAD | Freq: Once | CUTANEOUS | Status: AC
  Administered 2022-06-06: 1 via TOPICAL
  Filled 2022-06-05: qty 1

## 2022-06-05 MED ORDER — ORAL CARE MOUTH RINSE
15.0000 mL | OROMUCOSAL | Status: DC
  Administered 2022-06-05 – 2022-06-12 (×84): 15 mL via OROMUCOSAL

## 2022-06-05 MED ORDER — PREDNISONE 20 MG PO TABS
40.0000 mg | ORAL_TABLET | Freq: Every day | ORAL | Status: AC
  Administered 2022-06-09 – 2022-06-10 (×2): 40 mg
  Filled 2022-06-05 (×2): qty 2

## 2022-06-05 MED ORDER — ORAL CARE MOUTH RINSE: 15.0000 mL | OROMUCOSAL | Status: DC | PRN

## 2022-06-05 NOTE — Progress Notes (Signed)
ANTICOAGULATION CONSULT NOTE- follow-up  Pharmacy Consult for heparin Indication: pulmonary embolus  Allergies  Allergen Reactions   Canagliflozin Other (See Comments)    recurrent vaginitis   Glucophage [Metformin] Other (See Comments)    GI intolerance   Niaspan [Niacin] Other (See Comments)    flushing    Patient Measurements: Height: 5' 5.98" (167.6 cm) Weight: 87.4 kg (192 lb 10.9 oz) IBW/kg (Calculated) : 59.25 Heparin Dosing Weight: 79kg  Vital Signs: Temp: 99.3 F (37.4 C) (08/06 1000) Temp Source: Rectal (08/06 0400) BP: 121/56 (08/06 1000) Pulse Rate: 98 (08/06 1000)  Labs: Recent Labs    06/03/22 0415 06/03/22 0644 06/03/22 1600 06/04/22 0208 06/04/22 1600 06/05/22 0402  HGB 9.9*  --   --  9.2*  --  8.6*  HCT 31.4*  --   --  29.0*  --  27.0*  PLT 324  --   --  211  --  189  HEPARINUNFRC  --  0.70  --  0.51  --  0.65  CREATININE 1.12*  --    < > 1.10* 1.23* 1.01*   < > = values in this interval not displayed.     Estimated Creatinine Clearance: 60.2 mL/min (A) (by C-G formula based on SCr of 1.01 mg/dL (H)).   Assessment: 19 yoF admitted with meningitis from OSH. Pt with ongoing fevers and worsening clinical status 7/29, RV severely down on beside ECHO so concerns for acute PE. Pt received alteplase on 7/30 for suspected saddle PE.    Heparin level is therapeutic at 0.65, but continues to trend up. Hg trending down to 8.6 and plts stable. Pt bleeding from trach site, requiring thrombipad. Will reduce rate to target lower end of therapeutic given recent bleeding.  Goal of Therapy:  Heparin level 0.3-0.7 units/mL Monitor platelets by anticoagulation protocol: Yes   Plan:  Reduce heparin to 1200 units/hr Monitor daily heparin level/CBC, s/sx bleeding  Thank you for allowing pharmacy to participate in this patient's care.  Reatha Harps, PharmD PGY2 Pharmacy Resident 06/05/2022 10:53 AM Check [email or web address] for unit specific pharmacy number

## 2022-06-05 NOTE — Progress Notes (Signed)
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   Discharge summary reviewed: She initially presented to OSH on 05/20/22 for left upper extremity tremors. She reported recent hand foot any mouth disease contracted from her grandchild that left her weak and tired prior to this admission. Neurology work-up including MRI, EEG negative. MRI/MRA neg for acute infarct, 13 mm focus in left frontal lobe representing artifact vs small calcified meningioma, chronic small vessel ischemic changes, cerebral atrophy, high-grade stenosis in bilateral ICA, focal stenosis in left ACA and intracranial right vertebral artery. MRI of C-spine showing C4-5 cord compression from advanced disc degeneration with posterior endplate ridging.  Showing left foraminal impingement at the same level.  Also showing C5-6 and C6-7 ADCF with solid arthrodesis.  Neurologist at [facility] had recommended neurosurgical evaluation.  She developed fevers and transferred to ICU concerning for meningitis and started on Vanc, rocephin, ampicillin and acyclovir. LP was performed on 7/24 with WBC 55 RBC 2. Pending herpes, enterovirus, ehrlichia. CT A/P showed bilateral ground glass and lung opacities concerning for multifocal pneumonia.   PCCM consulted for decreased responsiveness and hypoxemia on NRB. On exam obtunded and only able to say name. O2 sats in the 70s on NRB. Transferred to the ICU for emergent intubation   Pertinent  Medical History  As above  Significant Hospital Events: Including  procedures, antibiotic start and stop dates in addition to other pertinent events   7/24 Admitted to [facility] from [place]. Transferred to PCCM when found in respiratory failure 7/25 intubated, bronched, art line placed, PICC placed.  Neuro and ID consulted. Started decadron  7/26 decadron dc/d 7/27 felt most consistent with viral meningoencephalitis  7/29-7/30 sudden onset multiorgan failure 8/4 trach  Interim History / Subjective:  Remain afebrile Remains on low-dose Levophed at 2 mics White count started trending down, now 30 K  Remains on CRRT and continuous EEG Small amount of bleeding around tracheostomy site overnight  Objective   Blood pressure (!) 115/48, pulse 98, temperature 99 F (37.2 C), resp. rate (!) 28, height 5' 5.98" (1.676 m), weight 87.4 kg, SpO2 97 %. CVP:  [3 mmHg-11 mmHg] 4 mmHg  Vent Mode: PRVC FiO2 (%):  [40 %] 40 % Set Rate:  [28 bmp] 28 bmp Vt Set:  [480 mL] 480 mL PEEP:  [8 cmH20] 8 cmH20 Plateau Pressure:  [21 cmH20] 21 cmH20   Intake/Output Summary (Last 24 hours) at 06/05/2022 1259 Last data filed at 06/05/2022 1200 Gross per 24 hour  Intake 2760.61 ml  Output 4078 ml  Net -1317.39 ml   Filed Weights   06/01/22 0439 06/02/22 0500 06/03/22 0340  Weight: 91 kg 88.9 kg 87.4 kg   Physical Exam: General: Crtitically ill-appearing female, orally intubated HEENT: [place]/AT, eyes anicteric.  ETT and OGT in place Neuro: Eyes closed, does not open not following commands.  Pupils 3 mm bilateral none reactive to light, off and gag, no movement to painful stimuli Chest: Coarse breath sounds, no wheezes or rhonchi Heart: Regular rate and rhythm, no murmurs or gallops Abdomen: Soft, nontender, nondistended, bowel sounds  present Skin: No rash  Resolved Hospital Problem list   Metabolic acidosis Hypertriglycerides being off propofol infusion Severe metabolic acidosis UTI  Assessment & Plan:  Acute infectious encephalopathy in setting of viral meningoencephalitis   Sedation has been stopped now She remains severely encephalopathic, not waking up  Acute hypoxemic/hypercapnic respiratory failure 2/2 multilobar PNA s/p tracheostomy Appreciate infectious disease follow-up White count started trending down, now 30 K, likely due to leukemoid reaction rather than active infection as patient is hemodynamically stable with low-dose vasopressor Continue meropenem CT chest abdomen pelvis suggestive of bilateral lower lobe infiltrate, no active abdominal process Continue lung protective ventilation Not ready for SBT due to unresponsiveness Patient tolerated tracheostomy very well  Tracheostomy site bleeding Patient has small ooze from tracheostomy site Thrombin pad applied, bleeding has stopped  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 Critical cervical myopathy s/p ACDF C5-C7 Continue droplet precautions Appreciate ID and neurology input Started on p.o. prednisone with taper dose Enterovirus CSF PCR is positive Cryptococcal antigen is negative Follow-up EBV, VZV, CMV and JC virus PCR, in process  Hypogammaglobinemia Patient is on Rituxan as an outpatient Her immunoglobulin levels were low Received IVIG  Status epilepticus Status epilepticus have improved but she still have periodic discharges EEG read showed no active seizures Patient is off propofol and Versed infusions Continue perampanel Keppra, Vimpat and phenobarbital Remain on continuous EEG Appreciate neurology follow-up  DM2 with severe hyperglycemia  She has labile blood sugars, currently continue with insulin infusion, once stabilized we will try to switch her to long-acting  Small lymphocytic B cell non-hodgkin lymphoma On maintenance rituximab (last received April 05 2022)  Follows Dr. Marin [facility] is following Immunoglobulins levels are are low, received replacement  Sudden onset shock, acute  renal failure, acute liver failure, RV dilation Due to saddle pulmonary embolism Patient received tPA, with improvement in hemodynamics CT chest yesterday confirmed Pulmonary emboli Continue IV heparin Shock has improved Remain on on CRRT Nephrology is following Repeat LFTs continue to trend down  Best Practice (right click and "Reselect all SmartList Selections" daily)   Diet/type: Tube feeds DVT prophylaxis: heparin gtt GI prophylaxis: PPI Lines: PICC  Foley: Remove Foley Code Status:  full code Last date of multidisciplinary goals of care discussion 8/6- Family updated at bedside, please see iPal note  Total critical care time: 33 minutes  Performed by: Jacky Kindle   Critical care time was exclusive of separately billable procedures and treating other patients.   Critical care was necessary to treat or prevent imminent or life-threatening deterioration.   Critical care was time spent personally by me on the following activities: development of treatment plan with patient and/or surrogate as well as nursing, discussions with consultants, evaluation of patient's response to treatment, examination of patient, obtaining history from patient or surrogate, ordering and performing treatments and interventions, ordering and review of laboratory studies, ordering and review of radiographic studies, pulse oximetry and re-evaluation of patient's condition.   Jacky Kindle, MD [facility] See Amion for pager If no response to pager, please call [phone] until 7pm After 7pm, Please call E-link [phone]

## 2022-06-05 NOTE — Procedures (Addendum)
Patient Name: Valerie Cline  MRN: [id number]  Epilepsy Attending: Lora Havens  Referring Physician/Provider: Greta Doom, MD   Duration: 06/04/2022 1014 to 06/05/2022 1014   Patient history: 65yo F with ams. EEG to evaluate for seizure   Level of alertness: comatose   AEDs during EEG study: versed, Keppra, Vimpat, perampanel, Phenobarb   Technical aspects: This EEG study was done with scalp electrodes positioned according to the 10-20 International system of electrode placement. Electrical activity was acquired at a sampling rate of '500Hz'$  and reviewed with a high frequency filter of '70Hz'$  and a low frequency filter of '1Hz'$ . EEG data were recorded continuously and digitally stored.    Description: EEG showed near continuous polymorphic sharply contoured 3 to 7 Hz theta-delta slowing admixed with intermittent 1 to 3 seconds of generalized EEG suppression. Polyspikes were also noted in left posterior quadrant which at times appeared quasiperiodic  at 1 Hz and rhythmic lasting 3-6 seconds consistent with brief-ictal-interictal rhythmic discharges.    ABNORMALITY -Brief-ictal-interictal rhythmic discharges, left posterior quadrant -Polyspikes, left posterior quadrant -Continuous slow, generalized   IMPRESSION: This study showed evidence of epileptogenicity in left posterior quadrant with high risk for seizure recurrence.  Additionally there was severe diffuse encephalopathy. No definite seizures were seen during this study.     Coun[facility] [place] [name]

## 2022-06-05 NOTE — Progress Notes (Signed)
ANTICOAGULATION CONSULT NOTE- follow-up  Pharmacy Consult for heparin Indication: pulmonary embolus  Allergies  Allergen Reactions   Canagliflozin Other (See Comments)    recurrent vaginitis   Glucophage [Metformin] Other (See Comments)    GI intolerance   Niaspan [Niacin] Other (See Comments)    flushing    Patient Measurements: Height: 5' 5.98" (167.6 cm) Weight: 87.4 kg (192 lb 10.9 oz) IBW/kg (Calculated) : 59.25 Heparin Dosing Weight: 79kg  Vital Signs: Temp: 98.8 F (37.1 C) (08/06 2000) Temp Source: Rectal (08/06 2000) BP: 130/54 (08/06 2024) Pulse Rate: 78 (08/06 2024)  Labs: Recent Labs    06/03/22 0415 06/03/22 0644 06/04/22 0208 06/04/22 1600 06/05/22 0402 06/05/22 1606 06/05/22 1855  HGB 9.9*  --  9.2*  --  8.6*  --   --   HCT 31.4*  --  29.0*  --  27.0*  --   --   PLT 324  --  211  --  189  --   --   HEPARINUNFRC  --    < > 0.51  --  0.65  --  0.67  CREATININE 1.12*   < > 1.10* 1.23* 1.01* 0.95  --    < > = values in this interval not displayed.     Estimated Creatinine Clearance: 64 mL/min (by C-G formula based on SCr of 0.95 mg/dL).   Assessment: 32 yoF admitted with meningitis from OSH. Pt with ongoing fevers and worsening clinical status 7/29, RV severely down on beside ECHO so concerns for acute PE. Pt received alteplase on 7/30 for suspected saddle PE.    Heparin level is therapeutic at 0.65, but continues to trend up. Hg trending down to 8.6 and plts stable. Pt bleeding from trach site, requiring thrombipad. Will reduce rate to target lower end of therapeutic given recent bleeding.  Heparin level came back at 0.67 this PM. Still oozing a little at the trach site. Will reduce rate again with new modified lower goal.   Goal of Therapy:  Heparin level 0.3-0.5 units/mL Monitor platelets by anticoagulation protocol: Yes   Plan:  Reduce heparin to 1100 units/hr Heparin level in AM Monitor daily heparin level/CBC, s/sx bleeding  Onnie Boer, PharmD, BCIDP, AAHIVP, CPP Infectious Disease Pharmacist 06/05/2022 8:42 PM

## 2022-06-05 NOTE — Progress Notes (Addendum)
eLink Physician-Brief Progress Note Patient Name: Valerie Cline DOB: 1956/01/04 MRN: [id number]   Date of Service  06/05/2022  HPI/Events of Note  Red alert  Camera: Trach, outside bleeding, oozing. Had thrombipad in day time,. Not in there now. Hg 8.6, plt > 180 K. IET tube, suction, no bleeding from lungs.   eICU Interventions  Apply Thrombipad, that helped earlier in day time.      Intervention Category Intermediate Interventions: Bleeding - evaluation and treatment with blood products  Elmer Sow 06/05/2022, 11:12 PM  00:03 Trach site bleeding not stopping. Notified bed side CCMElmyra Ricks, and NP Iona Beard for further care. CT chest from 5 th showed possible PE.   NP Iona Beard is at bed side.  - stat CBC - transfuse if Hg < 8 - consider holding heparin gtt for 4 hrs and observe whether oozing stops. If not will need ENT or gen surgery to place a stitch at oozing-venous site.

## 2022-06-05 NOTE — Progress Notes (Signed)
ID PROGRESS NOTE  Remains obtunded. Continues on low dose levo for pressor support No new culture results  Continue on current abtx regimen  Remingtyn Depaola B. [place] [facility] [phone]

## 2022-06-05 NOTE — Progress Notes (Signed)
[facility] Progress Note    66 y.o. year-old w/ hx HTN, hL, DM, CAD, carotid stenosis and hx NHL last chemoRx June 2023 presented to OSH on 7/21 for LUE tremors w/ recent hand-foot-and mouth disease. Work-up showed neg MRI of brain but MRI of C-spine showed sig cord compression at C4-5. Decompensated w/ high givers and AMS, concerning for meningitis and was moved to ICU. LP showed wbc 55 and CT showed multifocal PNA. Pt transferred to [facility] on 7/24. On 7/25 pt become poorly responsiveness and was intubated and moved to ICU. Fevers continued and pt developed hypotension/ shock on 7/29 and started on vaso and levo gtts.  Creat 0.8 on admit and cont to rise with hyperkalemia as well -> CRRT.    Assessment/ Plan:   AKI - b/l creat 0.8 on admission. Creat rising now in setting of new onset shock. Pt admitted for viral meningoencephalitis complicated by critical cervical myopathy and now resp failure w/ multifocal PNA. Pt has lymphoma receiving active chemoRx (rituximab) w/ last dose in June 2023. AKI due to shock which started in last 24 hrs abruptly. CCM suspecting acute PE, s/p TPA and now on IV heparin. K+ up to 6.8 -> CCRT 7/30 w/ all 2K fluids -> 4K now. No signs of renal recovery. Very minimal UOP.  Confirmed with bladder scan 4m which is not unexpected while on CRRT.  Seen on CRRT with 2K fluids and adjust as needed. K currently 4.8 (had been 5.4 for 2 days).  On low dose Levophed 1-2 mcg (primarily bec of the heavy sedation) and tolerating 100 ml/hr (50-100). Now 5218 mL pos during hospitalization.   Plan on stopping CRRT 7am 8/7 and we will see where her renal function is at; prefer to give her a trial when K is a little lower.  Volume - mod edema on exam, I/O + 5.2 L since admit. Tolerating 50-1063mhr. Shock - maybe from multilobar PNA + viral menigoencephalitis, as above, on vasopressor support,  H/o lymphoma - getting active chemoRx w/ last dose rituxan in June 2023 AHRF -  on vent, multifocal PNA on broad spec IV abx per CCM Status epilepticus on propofol AMS - in setting of viral meningoencephalitis  Cervical myelopathy  DM2 - on insulin  Subjective:   Niece bedside today (updated); Levophed (low dose 1-2 mcg).   Objective:   BP (!) 115/48   Pulse 98   Temp 99 F (37.2 C)   Resp (!) 28   Ht 5' 5.98" (1.676 m)   Wt 87.4 kg   SpO2 97%   BMI 31.12 kg/m   Intake/Output Summary (Last 24 hours) at 06/05/2022 1248 Last data filed at 06/05/2022 1200 Gross per 24 hour  Intake 2760.61 ml  Output 4078 ml  Net -1317.39 ml   Weight change:   Physical Exam: Gen on vent, sedated No rash, cyanosis or gangrene Sclera anicteric, throat w/ ETT No jvd or bruits Chest clear anterior/ latera RRR no RG Abd soft ntnd no mass or ascites +bs GU foley draining clear light amber urine Ext tr  bilat UE > LE edema, no wounds or ulcers Neuro is on vent, sedated  Imaging: CT CHEST ABDOMEN PELVIS W CONTRAST  Result Date: 06/04/2022 CLINICAL DATA:  Abdominal pain. EXAM: CT CHEST, ABDOMEN, AND PELVIS WITH CONTRAST TECHNIQUE: Multidetector CT imaging of the chest, abdomen and pelvis was performed following the standard protocol during bolus administration of intravenous contrast. RADIATION DOSE REDUCTION: This exam was performed according to the  departmental dose-optimization program which includes automated exposure control, adjustment of the mA and/or kV according to patient size and/or use of iterative reconstruction technique. CONTRAST:  125m OMNIPAQUE IOHEXOL 300 MG/ML  SOLN COMPARISON:  09/03/2020. Chest radiograph an abdomen radiographs, 06/03/2022. FINDINGS: CT CHEST FINDINGS Cardiovascular: Heart normal in size. No pericardial effusion. Three-vessel coronary artery calcifications. Great vessels are normal in caliber. Aortic atherosclerosis. No dissection. Vague filling defect in the peripheral right pulmonary artery extending to the right lower lobe pulmonary artery and  segmental branches. This could reflect pulmonary emboli, but is equivocal Mediastinum/Nodes: Stable, previously assessed, left thyroid gland nodule/enlargement. No mediastinal or hilar masses. No enlarged lymph nodes. Endotracheal tube tip projects 4.7 cm above the carina. Nasal/orogastric tube tip lies in the distal stomach. Lungs/Pleura: Patchy right lower lobe consolidation. Mild associated bronchiectasis. Milder opacity in the dependent posteromedial left lower lobe, consistent with atelectasis. There is atelectasis or scarring in dependent right middle lobe and left upper lobe lingula, with associated bronchiectasis. There are additional bilateral areas of ground-glass opacity as well as linear opacities. These findings are new compared to the prior chest CT from 09/03/2020. Trace, right greater than left, pleural effusions. No pneumothorax. Musculoskeletal: No fracture or acute finding.  No bone lesion. CT ABDOMEN PELVIS FINDINGS Hepatobiliary: Normal liver. Small dependent gallstone. No acute cholecystitis. No bile duct dilation. Pancreas: Unremarkable. No pancreatic ductal dilatation or surrounding inflammatory changes. Spleen: Normal in size without focal abnormality. Adrenals/Urinary Tract: No adrenal masses. Kidneys normal in overall size, orientation and position. No renal mass or stone. No hydronephrosis. Normal ureters. Bladder decompressed with a Foley catheter. Stomach/Bowel: Normal stomach. Decompressed small bowel. Scattered colonic air-fluid levels. No bowel wall thickening. No inflammation. Rectal tube in place. Vascular/Lymphatic: Aortic atherosclerosis. No aneurysm. No enlarged lymph nodes. Reproductive: Uterus and bilateral adnexa are unremarkable. Other: Small amount of ascites. Subcutaneous edema along the lower abdomen and pelvis. Musculoskeletal: Chronic bilateral pars defects at L5-S1 with a grade 2 to grade 3 anterolysis. No acute fracture. Status post posterior fusion, L1, L2 and L3.  This is new compared to the prior CT. No bone lesion. IMPRESSION: 1. Possible pulmonary emboli involving the right peripheral pulmonary artery/right lower lobe pulmonary arteries. Consider follow-up CTA chest for further assessment if this patient can tolerate a repeat contrast dose. 2. Bilateral lung opacities, most confluent in the right lower lobe, suspected to be multifocal pneumonia. 3. Trace bilateral pleural effusions. 4. Aortic atherosclerosis and 3 vessel coronary artery calcifications. 5. Small amount of ascites. 6. No acute abnormality within the abdomen or pelvis. Electronically Signed   By: DLajean ManesM.D.   On: 06/04/2022 10:28   DG Abd Portable 1V  Result Date: 06/03/2022 CLINICAL DATA:  NGT EXAM: PORTABLE ABDOMEN - 1 VIEW COMPARISON:  None Available. FINDINGS: Nonobstructive pattern of included bowel gas. Non weighted enteric feeding tube is positioned with tip below the diaphragm, in the vicinity of the pylorus or duodenal bulb. IMPRESSION: Non weighted enteric feeding tube is positioned with tip below the diaphragm, in the vicinity of the pylorus or duodenal bulb. Consider advancement if post pyloric placement is desired. Electronically Signed   By: ADelanna AhmadiM.D.   On: 06/03/2022 15:26   DG Chest Port 1 View  Result Date: 06/03/2022 CLINICAL DATA:  Status post tracheostomy. EXAM: PORTABLE CHEST 1 VIEW COMPARISON:  05/29/2022 FINDINGS: Removal of endotracheal tube with placement of tracheostomy since the prior chest x-ray. Tracheostomy appearance by x-ray appears appropriate. Non tunneled large bore central venous catheter  remains in place with the catheter tip in the SVC. Improved aeration of the right lung with some residual atelectasis or infiltrate remaining at the right lung base. No pulmonary edema or pleural fluid identified. No pneumothorax. IMPRESSION: Tracheostomy appears appropriate by chest x-ray. Improved aeration of the right lung with residual atelectasis or infiltrate  at the right lung base. Electronically Signed   By: Aletta Edouard M.D.   On: 06/03/2022 15:23    Labs: BMET Recent Labs  Lab 06/02/22 0500 06/02/22 1530 06/03/22 0415 06/03/22 1600 06/04/22 0208 06/04/22 1600 06/05/22 0402  NA 135 136 135 136 137 137 136  K 4.5 5.3* 5.3* 5.4* 5.4* 5.1 4.8  CL 102 104 103 103 105 104 103  CO2 '23 25 23 25 26 25 25  '$ GLUCOSE 186* 149* 193* 240* 156* 133* 180*  BUN 40* 41* 40* 40* 45* 55* 50*  CREATININE 1.15* 1.07* 1.12* 1.05* 1.10* 1.23* 1.01*  CALCIUM 7.6* 7.7* 7.9* 8.0* 8.0* 7.8* 7.7*  PHOS 3.7 3.7 4.2 4.1 3.7 5.3* 4.3   CBC Recent Labs  Lab 06/02/22 0500 06/03/22 0415 06/04/22 0208 06/05/22 0402  WBC 46.7* 61.3* 36.3* 30.3*  NEUTROABS  --  57.6* 32.6* 27.6*  HGB 9.5* 9.9* 9.2* 8.6*  HCT 30.1* 31.4* 29.0* 27.0*  MCV 78.4* 78.7* 79.2* 78.7*  PLT 270 324 211 189    Medications:     Chlorhexidine Gluconate Cloth  6 each Topical Daily   docusate  100 mg Per Tube BID   fentaNYL (SUBLIMAZE) injection  200 mcg Intravenous Once   [START ON 06/06/2022] methylPREDNISolone (SOLU-MEDROL) injection  125 mg Intravenous Q24H   midazolam  5 mg Intravenous Once   multivitamin  1 tablet Per Tube QHS   mouth rinse  15 mL Mouth Rinse Q2H   pantoprazole sodium  40 mg Per Tube Daily   perampanel  4 mg Per Tube Q1200   PHENObarbital  65 mg Intravenous BID   polyethylene glycol  17 g Per Tube Daily   [START ON 06/07/2022] predniSONE  80 mg Per Tube Q breakfast   Followed by   Derrill Memo ON 06/09/2022] predniSONE  40 mg Per Tube Q breakfast   Followed by   Derrill Memo ON 06/11/2022] predniSONE  20 mg Per Tube Q breakfast   sodium chloride flush  10-40 mL Intracatheter Q12H      Otelia Santee, MD 06/05/2022, 12:48 PM

## 2022-06-06 ENCOUNTER — Inpatient Hospital Stay (HOSPITAL_COMMUNITY): Payer: Medicare Other

## 2022-06-06 DIAGNOSIS — G40901 Epilepsy, unspecified, not intractable, with status epilepticus: Secondary | ICD-10-CM | POA: Diagnosis not present

## 2022-06-06 DIAGNOSIS — R4182 Altered mental status, unspecified: Secondary | ICD-10-CM | POA: Diagnosis not present

## 2022-06-06 DIAGNOSIS — G039 Meningitis, unspecified: Secondary | ICD-10-CM | POA: Diagnosis not present

## 2022-06-06 LAB — GLUCOSE, CAPILLARY
Glucose-Capillary: 120 mg/dL — ABNORMAL HIGH (ref 70–99)
Glucose-Capillary: 127 mg/dL — ABNORMAL HIGH (ref 70–99)
Glucose-Capillary: 141 mg/dL — ABNORMAL HIGH (ref 70–99)
Glucose-Capillary: 143 mg/dL — ABNORMAL HIGH (ref 70–99)
Glucose-Capillary: 143 mg/dL — ABNORMAL HIGH (ref 70–99)
Glucose-Capillary: 160 mg/dL — ABNORMAL HIGH (ref 70–99)
Glucose-Capillary: 170 mg/dL — ABNORMAL HIGH (ref 70–99)
Glucose-Capillary: 173 mg/dL — ABNORMAL HIGH (ref 70–99)
Glucose-Capillary: 260 mg/dL — ABNORMAL HIGH (ref 70–99)
Glucose-Capillary: 324 mg/dL — ABNORMAL HIGH (ref 70–99)
Glucose-Capillary: 347 mg/dL — ABNORMAL HIGH (ref 70–99)

## 2022-06-06 LAB — CBC WITH DIFFERENTIAL/PLATELET
Abs Immature Granulocytes: 0.58 10*3/uL — ABNORMAL HIGH (ref 0.00–0.07)
Abs Immature Granulocytes: 0.53 10*3/uL — ABNORMAL HIGH (ref 0.00–0.07)
Basophils Absolute: 0 10*3/uL (ref 0.0–0.1)
Basophils Absolute: 0 10*3/uL (ref 0.0–0.1)
Basophils Relative: 0 %
Basophils Relative: 0 %
Eosinophils Absolute: 0.1 10*3/uL (ref 0.0–0.5)
Eosinophils Absolute: 0.1 10*3/uL (ref 0.0–0.5)
Eosinophils Relative: 0 %
Eosinophils Relative: 0 %
HCT: 27 % — ABNORMAL LOW (ref 36.0–46.0)
HCT: 27 % — ABNORMAL LOW (ref 36.0–46.0)
Hemoglobin: 8.4 g/dL — ABNORMAL LOW (ref 12.0–15.0)
Hemoglobin: 8.4 g/dL — ABNORMAL LOW (ref 12.0–15.0)
Immature Granulocytes: 3 %
Immature Granulocytes: 3 %
Lymphocytes Relative: 1 %
Lymphocytes Relative: 1 %
Lymphs Abs: 0.3 10*3/uL — ABNORMAL LOW (ref 0.7–4.0)
Lymphs Abs: 0.3 10*3/uL — ABNORMAL LOW (ref 0.7–4.0)
MCH: 24.9 pg — ABNORMAL LOW (ref 26.0–34.0)
MCH: 24.8 pg — ABNORMAL LOW (ref 26.0–34.0)
MCHC: 31.1 g/dL (ref 30.0–36.0)
MCHC: 31.1 g/dL (ref 30.0–36.0)
MCV: 80.1 fL (ref 80.0–100.0)
MCV: 79.6 fL — ABNORMAL LOW (ref 80.0–100.0)
Monocytes Absolute: 1.2 10*3/uL — ABNORMAL HIGH (ref 0.1–1.0)
Monocytes Absolute: 1 10*3/uL (ref 0.1–1.0)
Monocytes Relative: 6 %
Monocytes Relative: 6 %
Neutro Abs: 18.5 10*3/uL — ABNORMAL HIGH (ref 1.7–7.7)
Neutro Abs: 16.9 10*3/uL — ABNORMAL HIGH (ref 1.7–7.7)
Neutrophils Relative %: 90 %
Neutrophils Relative %: 90 %
Platelets: 188 10*3/uL (ref 150–400)
Platelets: 168 10*3/uL (ref 150–400)
RBC: 3.37 MIL/uL — ABNORMAL LOW (ref 3.87–5.11)
RBC: 3.39 MIL/uL — ABNORMAL LOW (ref 3.87–5.11)
RDW: 20.5 % — ABNORMAL HIGH (ref 11.5–15.5)
RDW: 20.8 % — ABNORMAL HIGH (ref 11.5–15.5)
WBC: 20.8 10*3/uL — ABNORMAL HIGH (ref 4.0–10.5)
WBC: 18.8 10*3/uL — ABNORMAL HIGH (ref 4.0–10.5)
nRBC: 1 % — ABNORMAL HIGH (ref 0.0–0.2)
nRBC: 0.8 % — ABNORMAL HIGH (ref 0.0–0.2)

## 2022-06-06 LAB — RENAL FUNCTION PANEL
Albumin: 2.2 g/dL — ABNORMAL LOW (ref 3.5–5.0)
Anion gap: 7 (ref 5–15)
BUN: 43 mg/dL — ABNORMAL HIGH (ref 8–23)
CO2: 26 mmol/L (ref 22–32)
Calcium: 7.8 mg/dL — ABNORMAL LOW (ref 8.9–10.3)
Chloride: 102 mmol/L (ref 98–111)
Creatinine, Ser: 0.92 mg/dL (ref 0.44–1.00)
GFR, Estimated: >60 mL/min (ref >60)
Glucose, Bld: 149 mg/dL — ABNORMAL HIGH (ref 70–99)
Phosphorus: 3.6 mg/dL (ref 2.5–4.6)
Potassium: 3.4 mmol/L — ABNORMAL LOW (ref 3.5–5.1)
Sodium: 135 mmol/L (ref 135–145)

## 2022-06-06 LAB — CMV DNA, QUANTITATIVE, PCR
CMV DNA Quant: NEGATIVE IU/mL
Log10 CMV Qn DNA Pl: UNDETERMINED log10 IU/mL

## 2022-06-06 LAB — HEPARIN LEVEL (UNFRACTIONATED): Heparin Unfractionated: <0.1 IU/mL — ABNORMAL LOW (ref 0.30–0.70)

## 2022-06-06 LAB — MAGNESIUM: Magnesium: 2.4 mg/dL (ref 1.7–2.4)

## 2022-06-06 LAB — EPSTEIN BARR VRS(EBV DNA BY PCR): EBV DNA QN by PCR: NEGATIVE IU/mL

## 2022-06-06 MED ORDER — INSULIN ASPART 100 UNIT/ML IJ SOLN
5.0000 [IU] | INTRAMUSCULAR | Status: DC
  Administered 2022-06-06 (×2): 5 [IU] via SUBCUTANEOUS

## 2022-06-06 MED ORDER — MIDODRINE HCL 5 MG PO TABS
5.0000 mg | ORAL_TABLET | Freq: Three times a day (TID) | ORAL | Status: DC
  Administered 2022-06-06 (×3): 5 mg
  Filled 2022-06-06 (×3): qty 1

## 2022-06-06 MED ORDER — INSULIN ASPART 100 UNIT/ML IJ SOLN
8.0000 [IU] | INTRAMUSCULAR | Status: DC
  Administered 2022-06-06 – 2022-06-08 (×10): 8 [IU] via SUBCUTANEOUS

## 2022-06-06 MED ORDER — "THROMBI-PAD 3""X3"" EX PADS"
1.0000 | MEDICATED_PAD | Freq: Once | CUTANEOUS | Status: AC
  Administered 2022-06-06: 1 via TOPICAL

## 2022-06-06 MED ORDER — LIDOCAINE-EPINEPHRINE 1 %-1:100000 IJ SOLN
20.0000 mL | Freq: Once | INTRAMUSCULAR | Status: AC
  Administered 2022-06-06: 20 mL via INTRADERMAL
  Filled 2022-06-06: qty 1

## 2022-06-06 MED ORDER — INSULIN ASPART 100 UNIT/ML IJ SOLN
0.0000 [IU] | INTRAMUSCULAR | Status: DC
  Administered 2022-06-06: 11 [IU] via SUBCUTANEOUS
  Administered 2022-06-06: 4 [IU] via SUBCUTANEOUS
  Administered 2022-06-06: 20 [IU] via SUBCUTANEOUS
  Administered 2022-06-06: 15 [IU] via SUBCUTANEOUS
  Administered 2022-06-07: 11 [IU] via SUBCUTANEOUS
  Administered 2022-06-07 (×5): 7 [IU] via SUBCUTANEOUS
  Administered 2022-06-08 (×2): 3 [IU] via SUBCUTANEOUS
  Administered 2022-06-09: 4 [IU] via SUBCUTANEOUS
  Administered 2022-06-09 (×3): 7 [IU] via SUBCUTANEOUS
  Administered 2022-06-10: 4 [IU] via SUBCUTANEOUS
  Administered 2022-06-10 (×3): 7 [IU] via SUBCUTANEOUS
  Administered 2022-06-10: 4 [IU] via SUBCUTANEOUS
  Administered 2022-06-10: 7 [IU] via SUBCUTANEOUS
  Administered 2022-06-11: 3 [IU] via SUBCUTANEOUS
  Administered 2022-06-11 (×2): 4 [IU] via SUBCUTANEOUS
  Administered 2022-06-11: 7 [IU] via SUBCUTANEOUS
  Administered 2022-06-12 – 2022-06-13 (×9): 4 [IU] via SUBCUTANEOUS
  Administered 2022-06-13: 7 [IU] via SUBCUTANEOUS
  Administered 2022-06-13 (×3): 4 [IU] via SUBCUTANEOUS
  Administered 2022-06-14: 15 [IU] via SUBCUTANEOUS
  Administered 2022-06-14: 11 [IU] via SUBCUTANEOUS
  Administered 2022-06-14 – 2022-06-15 (×4): 4 [IU] via SUBCUTANEOUS
  Administered 2022-06-15 (×2): 15 [IU] via SUBCUTANEOUS
  Administered 2022-06-15 – 2022-06-16 (×2): 7 [IU] via SUBCUTANEOUS
  Administered 2022-06-16: 4 [IU] via SUBCUTANEOUS
  Administered 2022-06-16: 3 [IU] via SUBCUTANEOUS
  Administered 2022-06-16: 4 [IU] via SUBCUTANEOUS
  Administered 2022-06-16: 3 [IU] via SUBCUTANEOUS
  Administered 2022-06-17: 4 [IU] via SUBCUTANEOUS
  Administered 2022-06-17: 7 [IU] via SUBCUTANEOUS
  Administered 2022-06-17: 4 [IU] via SUBCUTANEOUS
  Administered 2022-06-17 (×2): 7 [IU] via SUBCUTANEOUS
  Administered 2022-06-18 (×4): 4 [IU] via SUBCUTANEOUS
  Administered 2022-06-18: 7 [IU] via SUBCUTANEOUS
  Administered 2022-06-18 – 2022-06-19 (×4): 4 [IU] via SUBCUTANEOUS
  Administered 2022-06-19: 7 [IU] via SUBCUTANEOUS
  Administered 2022-06-19: 4 [IU] via SUBCUTANEOUS
  Administered 2022-06-19 – 2022-06-20 (×2): 7 [IU] via SUBCUTANEOUS
  Administered 2022-06-20 (×3): 4 [IU] via SUBCUTANEOUS
  Administered 2022-06-20: 7 [IU] via SUBCUTANEOUS
  Administered 2022-06-21: 4 [IU] via SUBCUTANEOUS
  Administered 2022-06-21 (×2): 7 [IU] via SUBCUTANEOUS
  Administered 2022-06-21: 3 [IU] via SUBCUTANEOUS
  Administered 2022-06-21 – 2022-06-22 (×8): 4 [IU] via SUBCUTANEOUS
  Administered 2022-06-22: 7 [IU] via SUBCUTANEOUS
  Administered 2022-06-23: 4 [IU] via SUBCUTANEOUS
  Administered 2022-06-23: 7 [IU] via SUBCUTANEOUS
  Administered 2022-06-23: 3 [IU] via SUBCUTANEOUS
  Administered 2022-06-23: 11 [IU] via SUBCUTANEOUS
  Administered 2022-06-23 – 2022-06-24 (×3): 4 [IU] via SUBCUTANEOUS
  Administered 2022-06-24: 7 [IU] via SUBCUTANEOUS
  Administered 2022-06-24: 4 [IU] via SUBCUTANEOUS
  Administered 2022-06-24 (×2): 7 [IU] via SUBCUTANEOUS
  Administered 2022-06-25: 11 [IU] via SUBCUTANEOUS

## 2022-06-06 MED ORDER — INSULIN DETEMIR 100 UNIT/ML ~~LOC~~ SOLN
24.0000 [IU] | Freq: Two times a day (BID) | SUBCUTANEOUS | Status: DC
  Administered 2022-06-06 (×2): 24 [IU] via SUBCUTANEOUS
  Filled 2022-06-06 (×4): qty 0.24

## 2022-06-06 NOTE — Plan of Care (Signed)

## 2022-06-06 NOTE — Progress Notes (Signed)
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   Discharge summary reviewed: She initially presented to OSH on 05/20/22 for left upper extremity tremors. She reported recent hand foot any mouth disease contracted from her grandchild that left her weak and tired prior to this admission. Neurology work-up including MRI, EEG negative. MRI/MRA neg for acute infarct, 13 mm focus in left frontal lobe representing artifact vs small calcified meningioma, chronic small vessel ischemic changes, cerebral atrophy, high-grade stenosis in bilateral ICA, focal stenosis in left ACA and intracranial right vertebral artery. MRI of C-spine showing C4-5 cord compression from advanced disc degeneration with posterior endplate ridging.  Showing left foraminal impingement at the same level.  Also showing C5-6 and C6-7 ADCF with solid arthrodesis.  Neurologist at [facility] had recommended neurosurgical evaluation.  She developed fevers and transferred to ICU concerning for meningitis and started on Vanc, rocephin, ampicillin and acyclovir. LP was performed on 7/24 with WBC 55 RBC 2. Pending herpes, enterovirus, ehrlichia. CT A/P showed bilateral ground glass and lung opacities concerning for multifocal pneumonia.   PCCM consulted for decreased responsiveness and hypoxemia on NRB. On exam obtunded and only able to say name. O2 sats in the 70s on NRB. Transferred to the ICU for emergent intubation   Pertinent  Medical History  As above  Significant Hospital Events: Including  procedures, antibiotic start and stop dates in addition to other pertinent events   7/24 Admitted to [facility] from [place]. Transferred to PCCM when found in respiratory failure 7/25 intubated, bronched, art line placed, PICC placed.  Neuro and ID consulted. Started decadron  7/26 decadron dc/d 7/27 felt most consistent with viral meningoencephalitis  7/29-7/30 sudden onset multiorgan failure; given tpa with improvement 8/4 trach 8/6 trach bleeding  Interim History / Subjective:  Remains poorly responsive on low dose levophed and CRRT.  Oozing from trach stoma overnight, heparin held.  Objective   Blood pressure (!) 137/57, pulse 84, temperature (!) 97.3 F (36.3 C), resp. rate (!) 28, height 5' 5.98" (1.676 m), weight 87.4 kg, SpO2 97 %. CVP:  [3 mmHg-9 mmHg] 9 mmHg  Vent Mode: PRVC FiO2 (%):  [40 %] 40 % Set Rate:  [28 bmp] 28 bmp Vt Set:  [0.48 mL-480 mL] 0.48 mL PEEP:  [8 cmH20] 8 cmH20 Plateau Pressure:  [18 cmH20-21 cmH20] 20 cmH20   Intake/Output Summary (Last 24 hours) at 06/06/2022 4403 Last data filed at 06/06/2022 0600 Gross per 24 hour  Intake 2722.32 ml  Output 5516 ml  Net -2793.68 ml    Filed Weights   06/01/22 0439 06/02/22 0500 06/03/22 0340  Weight: 91 kg 88.9 kg 87.4 kg   Physical Exam: Comatose on vent Lungs with rhonci bilaterally Thick clot surrounding tracheostomy site, no active oozing Trace global anasarca Not responsive to pain Triggers vent Pupils equal, not tracking  CBC stable BMP stable on CRRT  Resolved Hospital Problem list   Metabolic acidosis Hypertriglycerides being off propofol infusion Severe metabolic acidosis UTI  Assessment & Plan:  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  Preceding hand/foot  mouth.  CSF enterovirus + Critical cervical myopathy s/p ACDF C5-C7- makes repeat LP dangerous. She remains severely encephalopathic, not waking up; recovery could take weeks to months if it occurs. -  Continue to hold sedation - Supportive care - Will need LTACH vs. SNF - Unclear duration for droplet - Follow-up EBV, VZV, CMV and JC virus PCR, in process. - On steroid taper, ?reason - Appreciate ID and neuro input  Acute hypoxemic/hypercapnic respiratory failure 2/2 multilobar PNA s/p tracheostomy Leukamoid reaction vs. HCAP- improving - PS trial today - Meropenem to complete 7 days  Tracheostomy site bleeding- heparin on hold - Will get lido/epi and sutures, address today  Small lymphocytic B cell non-hodgkin lymphoma Hypogammaglobinemia- Patient is on Rituxan as an outpatient; last dose in June Her immunoglobulin levels were low Received IVIG, question on redosing  Status epilepticus- resolved but requiring multiple AEDs -Continue Keppra, Vimpat and phenobarbital Appreciate neurology follow-up; do not see a not in several days  DM2 with severe hyperglycemia  - Switch to basal bolus, adjust PRN  Sudden onset shock, acute renal failure, acute liver failure, RV dilation- s/p tpa with good improvement in hemodynamics - Add midodrine - Wean levophed for MAP 65 - Heparin drip to resume after trach bleeding fixed - CRRT per nephrology, eventual trial off  Best Practice (right click and "Reselect all SmartList Selections" daily)   Diet/type: Tube feeds DVT prophylaxis: heparin gtt GI prophylaxis: PPI Lines: PICC  Foley: ?Remove Foley Code Status:  full code Last date of multidisciplinary goals of care discussion 8/6- Family updated at bedside, please see iPal note  Total critical care time: 57 minutes  Erskine Emery MD PCCM

## 2022-06-06 NOTE — Progress Notes (Signed)
PCCM INTERVAL PROGRESS NOTE   Eval for tracheostomy oozing.  Gauze dressing saturated. Taken down to reveal large clot. Clot removed. Sutures removed. No further oozing visualized. Lidocaine with epinephrine injected into 3 separate aspects of the stoma totaling 1.5 mL. Trach ties re-applied.     Valerie Cline, AGACNP-BC [facility]  See Amion for personal pager PCCM on call pager [phone] until 7pm. Please call Elink 7p-7a. [phone]  06/06/2022 11:12 AM

## 2022-06-06 NOTE — Progress Notes (Addendum)
Called to bedside for bleeding, oozing trach site.  No airway bleeding, just oozing externally.  Unable to identify exact source, trach placed 06/03/22.  Stopped heparin given volume of bleeding.  Would have to remove trach to identify exact source.   Placed fresh thrombipad, gauze placed and taped.  Hb/hct ordered.

## 2022-06-06 NOTE — Progress Notes (Addendum)
Subjective:  ROS: Unable to obtain due to poor mental status  Examination  Vital signs in last 24 hours: Temp:  [97.3 F (36.3 C)-100 F (37.8 C)] 100 F (37.8 C) (08/07 0800) Pulse Rate:  [70-106] 105 (08/07 0800) Resp:  [25-32] 31 (08/07 0800) BP: (103-149)/(47-59) 143/55 (08/07 0800) SpO2:  [94 %-100 %] 94 % (08/07 0800) FiO2 (%):  [40 %] 40 % (08/07 0800)  General: lying in bed, NAD Neuro:  comatose, pupils 3 equal and reactive to light, subtle corneal reflex present, bleeding from tracheostomy site so did not check gag reflex, does not withdraw to noxious stimuli in all extremities  Basic Metabolic Panel: Recent Labs  Lab 06/02/22 0500 06/02/22 1530 06/03/22 0415 06/03/22 1600 06/04/22 0208 06/04/22 1600 06/05/22 0402 06/05/22 1606 06/06/22 0431  NA 135   < > 135   < > 137 137 136 136 135  K 4.5   < > 5.3*   < > 5.4* 5.1 4.8 4.3 3.4*  CL 102   < > 103   < > 105 104 103 103 102  CO2 23   < > 23   < > '26 25 25 25 26  '$ GLUCOSE 186*   < > 193*   < > 156* 133* 180* 159* 149*  BUN 40*   < > 40*   < > 45* 55* 50* 47* 43*  CREATININE 1.15*   < > 1.12*   < > 1.10* 1.23* 1.01* 0.95 0.92  CALCIUM 7.6*   < > 7.9*   < > 8.0* 7.8* 7.7* 7.8* 7.8*  MG 2.5*  --  2.6*  --  2.6*  --  2.5*  --  2.4  PHOS 3.7   < > 4.2   < > 3.7 5.3* 4.3 4.4 3.6   < > = values in this interval not displayed.    CBC: Recent Labs  Lab 06/03/22 0415 06/04/22 0208 06/05/22 0402 06/06/22 0022 06/06/22 0431  WBC 61.3* 36.3* 30.3* 20.8* 18.8*  NEUTROABS 57.6* 32.6* 27.6* 18.5* 16.9*  HGB 9.9* 9.2* 8.6* 8.4* 8.4*  HCT 31.4* 29.0* 27.0* 27.0* 27.0*  MCV 78.7* 79.2* 78.7* 80.1 79.6*  PLT 324 211 189 188 168     Coagulation Studies: No results for input(s): "LABPROT", "INR" in the last 72 hours.  Imaging No new brain imaging overnight   ASSESSMENT AND PLAN: 66 year old female with 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   Focal convulsive status epilepticus, resolved Provoked seizures Acute encephalopathy, infectious Viral meningo-encephalitis Left meningioma -Status epilepticus likely due to viral encephalitis, meningioma (meningiomas do not commonly cause status epilepticus but the location of this meningioma is concerning).     Recommendations -Continue Keppra 1 g twice daily, Vimpat 200 mg twice daily, perampanel 4 mg daily and phenobarbital 65 mg twice daily -If seizures recur, can increase Keppra -We will on hold EEG to obtain MRI brain without contrast as patient continues to remain encephalopathic and is now off heparin drip due to bleeding at tracheostomy site -On prednisone taper -Discussed plan with patient's husband at bedside -Discussed plan with Dr. Tamala Julian and patient's RN -Continue seizure precautions     I have spent a total of   36 minutes with the patient reviewing hospital notes,  test results, labs and examining the patient as well as establishing an assessment and plan.  > 50% of time was spent in direct patient care.      Zeb Comfort  Epilepsy [facility] For questions after 5pm please refer to AMION to reach the Neurologist on call

## 2022-06-06 NOTE — Progress Notes (Signed)
PT Cancellation Note  Patient Details Name: Valerie Cline MRN: [id number] DOB: 1956/05/23   Cancelled Treatment:    Reason Eval/Treat Not Completed: Patient not medically ready (per RN pt remains unresponsive and not yet appropriate for therapy)   [name] [name] 06/06/2022, 7:50 AM [place] Office: [phone]

## 2022-06-06 NOTE — Progress Notes (Signed)
[facility] Infectious Diseases Follow Up Note  Patient Identification: Patient Name: Valerie Cline MRN: [id number] [place] Date: 04/30/2022  9:11 PM Age: 66 y.o.Today's Date: 06/06/2022  Reason for Visit: Viral meningoencephalitis/leukocytosis  Principal Problem:   Meningitis Active Problems:   Severe sepsis ([place])   Acute metabolic encephalopathy   Acute respiratory failure with hypoxia (HCC)   Multifocal pneumonia   Meningioma (HCC)   Cervical myelopathy (HCC)   UTI (urinary tract infection)   Pressure injury of skin  Meropenem 7/28-7/31, 8/3-c Vancomycin 7/24-7/26, Vancomycin 7/30-7/31 Ceftriaxone 7/24, 7/27, Ceftriaxone 8/1-8/2 Metronidazole 8/1-8/2 Cefepime 7/25-7/26 Ampicillin 7/24-7/25 Acyclovir 7/24-7/26    Lines/Hardwares: RT IJ non tunneled HD Catheter, Left UE PICC, Tracheostomy   Interval Events: remains afebrile, WBC down trending, on very low dose levophed    Assessment # Entero viral meningoencephalitis in the setting of recent hand-foot and mouth disease with positive enterovirus in RVP in an immunocompromised patient Per [facility] Microbiology  CSF cx 7/24 NG, gram stain negative, HSV1 and 2 DNA negative  ( CSF WBC 55, N 84%, L 5%, M 2%, mesothelial cells and other cells 10%, RBC 2) Blood cx 7/21 and 7/22 negative) RMSF IgM and IgG + Lyme total ab 7/86 negative Ehrlichia PCR negative 7/67 Entero virus CSF PCR + , [place] virus negative, reported to ID pharmacist that [place] virus panel was not run due to not being enough CSF sample   # Bleeding from Tracheostomy site   # Seizures/Status Epilepticus  - on antiepileptics, prednisone taper  - EEG 8/7 Brief-ictal-interictal rhythmic discharges, left posterior quadrant. Polyspikes, left posterior quadrant. Continuous slow, generalized - Neurology following, plan for MRI brain    # Acute Respiratory Failure in the setting of encephalopathy + multolobar  PNA 7/25 BAL cx AFB negative, gram stain and cx negative, fungal cx pending  7/28 tracheal aspirate cx normal resp flora S/p tracheostomy 8/4  #Small lymphocytic B cell non-Hodgkin lymphoma # Hypogammoglobulinemia  On maintenance rituximab, last received June, 2023.   Follows Dr. Marin Olp 7/31 IGG/IGA low, IGM WNL, started on IVIG 8/2  #Suspected saddle pulmonary embolism in the setting of sudden onset shock/acute renal and liver failure ( Sock liver), RV dilatation with severe metabolic acidosis Status post tPA with improvement in hemodynamics, IV heparin held due to bleeding at trach  Plan to stop CRRT today  7/30 Liver US s/o cirrhosis, Liver enzymes down trending   # Critical Cervical Myelopathy/ACDF C5-C7/Progressive Weakness and Tremors  On Tapering prednisone Neurology following  Recommendations Ok to stop meropenem from tomorrow from ID standpoint given no evidence of bacterial infection. Has received adequate coverage for PNA empirically.   Leukocytosis already down trending with tapering of steroids. On very low dose levophed   ID available as needed, please recall if needed/questions   Thank you for the consult. Please page with pertinent questions or concerns. _____________________________________________________________________ Subjective patient seen and examined at the bedside.  Off sedation, on tube feeds   Vitals BP (!) 148/53 (BP Location: Right Arm)   Pulse (!) 106   Temp 99.9 F (37.7 C) (Rectal)   Resp (!) 32   Ht 5' 5.98" (1.676 m)   Wt 87.4 kg   SpO2 94%   BMI 31.12 kg/m   Physical Exam Constitutional: EEG leads on head    Comments:   Cardiovascular:     Rate and Rhythm: Normal rate and regular rhythm.     Heart sounds: oozing blood from tracheostomy site   Pulmonary:  Effort: Pulmonary effort is normal on trach to vent     Comments: Coarse breath sounds  Abdominal:     Palpations: Abdomen is soft.     Tenderness: Nondistended and  nontender  Musculoskeletal:        General: No swelling  in peripheral joints. Anasarca   Skin:    Comments: RT IJ non tunneled HD Catheter, Left UE PICC  Neurological:     General: Unresponsive   Pertinent Microbiology Results for orders placed or performed during the hospital encounter of 05/22/2022  MRSA Next Gen by PCR, Nasal     Status: None   Collection Time: 05/12/2022  9:42 PM   Specimen: Nasal Mucosa; Nasal Swab  Result Value Ref Range Status   MRSA by PCR Next Gen NOT DETECTED NOT DETECTED Final    Comment: (NOTE) The GeneXpert MRSA Assay (FDA approved for NASAL specimens only), is one component of a comprehensive MRSA colonization surveillance program. It is not intended to diagnose MRSA infection nor to guide or monitor treatment for MRSA infections. Test performance is not FDA approved in patients less than 15 years old. Performed at [facility], [place] [address] [place], [place] [address]   Urine Culture     Status: None   Collection Time: 05/24/22  2:11 AM   Specimen: Urine  Result Value Ref Range Status   Specimen Description URINE, CLEAN CATCH  Final   Special Requests NONE  Final   Culture   Final    NO GROWTH Performed at [facility], [address], [place], [place] [address]    Report Status 05/25/2022 FINAL  Final  Culture, blood (Routine X 2) w Reflex to ID Panel     Status: None   Collection Time: 05/24/22  6:16 AM   Specimen: BLOOD RIGHT HAND  Result Value Ref Range Status   Specimen Description BLOOD RIGHT HAND  Final   Special Requests   Final    BOTTLES DRAWN AEROBIC AND ANAEROBIC Blood Culture adequate volume   Culture   Final    NO GROWTH 5 DAYS Performed at [facility], [place] [address] [place], [place] [address]    Report Status 05/29/2022 FINAL  Final  Culture, blood (Routine X 2) w Reflex to ID Panel     Status: None   Collection Time: 05/24/22  6:17 AM   Specimen: BLOOD LEFT HAND  Result Value Ref Range Status    Specimen Description BLOOD LEFT HAND  Final   Special Requests   Final    BOTTLES DRAWN AEROBIC AND ANAEROBIC Blood Culture adequate volume   Culture   Final    NO GROWTH 5 DAYS Performed at [facility], [place] [address], [place], [place] [address]    Report Status 05/29/2022 FINAL  Final  Culture, Respiratory w Gram Stain     Status: None   Collection Time: 05/24/22  9:24 AM   Specimen: Bronchoalveolar Lavage; Respiratory  Result Value Ref Range Status   Specimen Description BRONCHIAL ALVEOLAR LAVAGE  Final   Special Requests Immunocompromised  Final   Gram Stain   Final    RARE WBC PRESENT, PREDOMINANTLY MONONUCLEAR NO ORGANISMS SEEN    Culture   Final    NO GROWTH 2 DAYS Performed at [facility], [address] [place], [place] [address]    Report Status 05/26/2022 FINAL  Final  Acid Fast Smear (AFB)     Status: None  Collection Time: 05/24/22  9:24 AM   Specimen: Bronchial Alveolar Lavage  Result Value Ref Range Status   AFB Specimen Processing Concentration  Final   Acid Fast Smear Negative  Final    Comment: (NOTE) Performed At: [facility] [place], [place] [id number] Rush Farmer MD [id number]    Source (AFB) BRONCHIAL ALVEOLAR LAVAGE  Final    Comment: Performed at [facility], [place] [address] [place], [place] [address]  Fungus Culture With Stain     Status: None (Preliminary result)   Collection Time: 05/24/22  9:24 AM   Specimen: Bronchial Alveolar Lavage  Result Value Ref Range Status   Fungus Stain Final report  Final    Comment: (NOTE) Performed At: [facility] [place], [place] [id number] Rush Farmer MD ZD:[id number]    Fungus (Mycology) Culture PENDING  Incomplete   Fungal Source BRONCHIAL ALVEOLAR LAVAGE  Final    Comment: Performed at [facility], [place] [address] [place], [place] [address]  Respiratory (~20 pathogens) panel by PCR     Status: None   Collection Time:  05/24/22  9:24 AM   Specimen: Bronchial Alveolar Lavage; Respiratory  Result Value Ref Range Status   Adenovirus NOT DETECTED NOT DETECTED Final   Coronavirus 229E NOT DETECTED NOT DETECTED Final    Comment: (NOTE) The Coronavirus on the Respiratory Panel, DOES NOT test for the novel  Coronavirus (2019 nCoV)    Coronavirus HKU1 NOT DETECTED NOT DETECTED Final   Coronavirus NL63 NOT DETECTED NOT DETECTED Final   Coronavirus OC43 NOT DETECTED NOT DETECTED Final   Metapneumovirus NOT DETECTED NOT DETECTED Final   Rhinovirus / Enterovirus NOT DETECTED NOT DETECTED Final   Influenza A NOT DETECTED NOT DETECTED Final   Influenza B NOT DETECTED NOT DETECTED Final   Parainfluenza Virus 1 NOT DETECTED NOT DETECTED Final   Parainfluenza Virus 2 NOT DETECTED NOT DETECTED Final   Parainfluenza Virus 3 NOT DETECTED NOT DETECTED Final   Parainfluenza Virus 4 NOT DETECTED NOT DETECTED Final   Respiratory Syncytial Virus NOT DETECTED NOT DETECTED Final   Bordetella pertussis NOT DETECTED NOT DETECTED Final   Bordetella Parapertussis NOT DETECTED NOT DETECTED Final   Chlamydophila pneumoniae NOT DETECTED NOT DETECTED Final   Mycoplasma pneumoniae NOT DETECTED NOT DETECTED Final    Comment: Performed at [facility], [address] [place] [address], [place], [place] [address]  Fungus Culture Result     Status: None   Collection Time: 05/24/22  9:24 AM  Result Value Ref Range Status   Result 1 Comment  Final    Comment: (NOTE) KOH/Calcofluor preparation:  no fungus observed. Performed At: [facility] [place], [place] [id number] Rush Farmer MD [id number]   Culture, Respiratory w Gram Stain     Status: None   Collection Time: 05/27/22  8:31 AM   Specimen: Tracheal Aspirate; Respiratory  Result Value Ref Range Status   Specimen Description TRACHEAL ASPIRATE  Final   Special Requests NONE  Final   Gram Stain   Final    ABUNDANT WBC PRESENT, PREDOMINANTLY PMN FEW GRAM NEGATIVE  RODS RARE GRAM POSITIVE COCCI IN PAIRS    Culture   Final    Normal respiratory flora-no Staph aureus or Pseudomonas seen Performed at [facility], [address] [place],  [address]    Report Status 05/29/2022 FINAL  Final  Respiratory (~20 pathogens) panel by PCR     Status:  Abnormal   Collection Time: 05/28/22  1:53 PM   Specimen: Nasopharyngeal Swab; Respiratory  Result Value Ref Range Status   Adenovirus NOT DETECTED NOT DETECTED Final   Coronavirus 229E NOT DETECTED NOT DETECTED Final    Comment: (NOTE) The Coronavirus on the Respiratory Panel, DOES NOT test for the novel  Coronavirus (2019 nCoV)    Coronavirus HKU1 NOT DETECTED NOT DETECTED Final   Coronavirus NL63 NOT DETECTED NOT DETECTED Final   Coronavirus OC43 NOT DETECTED NOT DETECTED Final   Metapneumovirus NOT DETECTED NOT DETECTED Final   Rhinovirus / Enterovirus DETECTED (A) NOT DETECTED Final   Influenza A NOT DETECTED NOT DETECTED Final   Influenza B NOT DETECTED NOT DETECTED Final   Parainfluenza Virus 1 NOT DETECTED NOT DETECTED Final   Parainfluenza Virus 2 NOT DETECTED NOT DETECTED Final   Parainfluenza Virus 3 NOT DETECTED NOT DETECTED Final   Parainfluenza Virus 4 NOT DETECTED NOT DETECTED Final   Respiratory Syncytial Virus NOT DETECTED NOT DETECTED Final   Bordetella pertussis NOT DETECTED NOT DETECTED Final   Bordetella Parapertussis NOT DETECTED NOT DETECTED Final   Chlamydophila pneumoniae NOT DETECTED NOT DETECTED Final   Mycoplasma pneumoniae NOT DETECTED NOT DETECTED Final    Comment: Performed at [facility], [place]. [address] [place], [place] [address]  Culture, blood (Routine X 2) w Reflex to ID Panel     Status: None   Collection Time: 05/29/22  8:58 AM   Specimen: BLOOD RIGHT HAND  Result Value Ref Range Status   Specimen Description BLOOD RIGHT HAND  Final   Special Requests   Final    BOTTLES DRAWN AEROBIC AND ANAEROBIC Blood Culture adequate volume   Culture    Final    NO GROWTH 5 DAYS Performed at [facility], [place] [address], [place], [place] [address]    Report Status 06/03/2022 FINAL  Final  Culture, blood (Routine X 2) w Reflex to ID Panel     Status: None   Collection Time: 05/29/22  8:58 AM   Specimen: BLOOD RIGHT HAND  Result Value Ref Range Status   Specimen Description BLOOD RIGHT HAND  Final   Special Requests IN PEDIATRIC BOTTLE Blood Culture adequate volume  Final   Culture   Final    NO GROWTH 5 DAYS Performed at [facility], [place] [address], [place], [place] [address]    Report Status 06/03/2022 FINAL  Final   Pertinent Lab.    Latest Ref Rng & Units 06/06/2022    4:31 AM 06/06/2022   12:22 AM 06/05/2022    4:02 AM  CBC  WBC 4.0 - 10.5 K/uL 18.8  20.8  30.3   Hemoglobin 12.0 - 15.0 g/dL 8.4  8.4  8.6   Hematocrit 36.0 - 46.0 % 27.0  27.0  27.0   Platelets 150 - 400 K/uL 168  188  189       Latest Ref Rng & Units 06/06/2022    4:31 AM 06/05/2022    4:06 PM 06/05/2022    4:02 AM  CMP  Glucose 70 - 99 mg/dL 149  159  180   BUN 8 - 23 mg/dL 43  47  50   Creatinine 0.44 - 1.00 mg/dL 0.92  0.95  1.01   Sodium 135 - 145 mmol/L 135  136  136   Potassium 3.5 - 5.1 mmol/L 3.4  4.3  4.8   Chloride 98 - 111 mmol/L 102  103  103   CO2 22 - 32 mmol/L _0 Calcium 8.9 - 10.3 mg/dL 7.8  7.8  7.7      Pertinent Imaging today Plain films and CT images have been personally visualized and interpreted; radiology reports have been reviewed. Decision making incorporated into the Impression / Recommendations.  No results found.   I spent 52  minutes for this patient encounter including review of prior medical records, coordination of care with primary/other specialist with greater than 50% of time being face to face/counseling and discussing diagnostics/treatment plan with the patient/family.  Electronically signed by:   Rosiland Oz, MD Infectious Disease Physician [facility]  D[facility] Pager: [phone]

## 2022-06-06 NOTE — Procedures (Signed)
Patient Name: DANNICA BICKHAM  MRN: [id number]  Epilepsy Attending: Lora Havens  Referring Physician/Provider: Greta Doom, MD   Duration: 06/05/2022 1014 to 06/06/2022 1014   Patient history: 66yo F with ams. EEG to evaluate for seizure   Level of alertness: comatose   AEDs during EEG study: Keppra, Vimpat, perampanel, Phenobarb   Technical aspects: This EEG study was done with scalp electrodes positioned according to the 10-20 International system of electrode placement. Electrical activity was acquired at a sampling rate of '500Hz'$  and reviewed with a high frequency filter of '70Hz'$  and a low frequency filter of '1Hz'$ . EEG data were recorded continuously and digitally stored.    Description: EEG showed near continuous polymorphic sharply contoured 3 to 7 Hz theta-delta slowing admixed with intermittent 1 to 3 seconds of generalized EEG suppression. Polyspikes were also noted in left posterior quadrant which at times appeared quasiperiodic  at 1 Hz and rhythmic lasting 3-6 seconds consistent with brief-ictal-interictal rhythmic discharges.    ABNORMALITY -Brief-ictal-interictal rhythmic discharges, left posterior quadrant -Polyspikes, left posterior quadrant -Continuous slow, generalized   IMPRESSION: This study showed evidence of epileptogenicity in left posterior quadrant with high risk for seizure recurrence.  Additionally there was severe diffuse encephalopathy. No definite seizures were seen during this study.     Taijuan Serviss [name]

## 2022-06-06 NOTE — Progress Notes (Signed)
Pt was disconnect for MRI. Will re connect when she returns. Pt noted to have skin breakdown around the FZ electrode site.

## 2022-06-06 NOTE — Progress Notes (Signed)
Inpatient Diabetes Program Recommendations  AACE/ADA: New Consensus Statement on Inpatient Glycemic Control (2015)  Target Ranges:  Prepandial:   less than 140 mg/dL      Peak postprandial:   less than 180 mg/dL (1-2 hours)      Critically ill patients:  140 - 180 mg/dL   Lab Results  Component Value Date   GLUCAP 324 (H) 06/06/2022   HGBA1C 8.0 (H) 05/24/2022    Review of Glycemic Control  Latest Reference Range & Units 06/06/22 03:47 06/06/22 04:46 06/06/22 05:46 06/06/22 07:00 06/06/22 11:14  Glucose-Capillary 70 - 99 mg/dL 143 (H) 160 (H) 127 (H) 173 (H) 324 (H)   Diabetes history: DM 2 Outpatient Diabetes medications:  U500 insulin-80 units with breakfast, 50 units with lunch, and 90 units with supper Current orders for Inpatient glycemic control:  Levemir 24 units bid Novolog 5 units q 4 hours Novolog 0-20 units q 4 hours Vital 60 ml/hr  Inpatient Diabetes Program Recommendations:    Note that patient transitioned off insulin drip.  She was on high dose U500 at home prior to admit and therefore is likely resistant to insulin.  She remains on IV steroids.   Blood sugar increased at 11 am.  Based on overnight insulin drip rates averaged approximately 5 units/ hr.  Agree with Levemir dose (note that patient has only received 1/2 of dose at this point which may be why blood sugar not controlled yet off insulin drip).  Consider increasing Novolog tube feed coverage to 8 units q 4 hours.    Thanks,  Adah Perl, RN, BC-ADM Inpatient Diabetes Coordinator Pager [phone]  (8a-5p)

## 2022-06-06 NOTE — Progress Notes (Signed)
Patient ID: Valerie Cline, female   DOB: 1956-01-19, 66 y.o.   MRN: [id number] S: CRRT stopped this morning O:BP (!) 143/55   Pulse (!) 105   Temp 100 F (37.8 C)   Resp (!) 31   Ht 5' 5.98" (1.676 m)   Wt 87.4 kg   SpO2 94%   BMI 31.12 kg/m   Intake/Output Summary (Last 24 hours) at 06/06/2022 1121 Last data filed at 06/06/2022 0600 Gross per 24 hour  Intake 2127.97 ml  Output 4756 ml  Net -2628.03 ml   Intake/Output: I/O last 3 completed shifts: In: 3917.3 [I.V.:952.2; NG/GT:2130; IV Piggyback:835.1] Out: 6734 [Urine:45; LPFXT:0240]  Intake/Output this shift:  No intake/output data recorded. Weight change:  Gen: unresponsive in bed CVS: tachy Resp: occ rhonchi Abd: +BS, soft, NT/ND Ext: 1+ edema ble  Recent Labs  Lab 05/31/22 0740 05/31/22 1456 06/02/22 0500 06/02/22 1530 06/03/22 0415 06/03/22 1600 06/04/22 0208 06/04/22 1600 06/05/22 0402 06/05/22 1606 06/06/22 0431  NA 138   < > 135   < > 135 136 137 137 136 136 135  K 4.3   < > 4.5   < > 5.3* 5.4* 5.4* 5.1 4.8 4.3 3.4*  CL 103   < > 102   < > 103 103 105 104 103 103 102  CO2 26   < > 23   < > '23 25 26 25 25 25 26  '$ GLUCOSE 346*   < > 186*   < > 193* 240* 156* 133* 180* 159* 149*  BUN 46*   < > 40*   < > 40* 40* 45* 55* 50* 47* 43*  CREATININE 1.58*   < > 1.15*   < > 1.12* 1.05* 1.10* 1.23* 1.01* 0.95 0.92  ALBUMIN 2.5*   < > 2.4*  2.4*   < > 2.5* 2.4* 2.4* 2.2* 2.2* 2.3* 2.2*  CALCIUM 7.5*   < > 7.6*   < > 7.9* 8.0* 8.0* 7.8* 7.7* 7.8* 7.8*  PHOS  --    < > 3.7   < > 4.2 4.1 3.7 5.3* 4.3 4.4 3.6  AST 2,519*  --  201*  --   --   --   --   --   --   --   --   ALT 3,408*  --  1,482*  --   --   --   --   --   --   --   --    < > = values in this interval not displayed.   Liver Function Tests: Recent Labs  Lab 05/31/22 0740 05/31/22 1456 06/02/22 0500 06/02/22 1530 06/05/22 0402 06/05/22 1606 06/06/22 0431  AST 2,519*  --  201*  --   --   --   --   ALT 3,408*  --  1,482*  --   --   --   --   ALKPHOS  146*  --  164*  --   --   --   --   BILITOT 1.4*  --  1.3*  --   --   --   --   PROT 4.7*  --  6.0*  --   --   --   --   ALBUMIN 2.5*   < > 2.4*  2.4*   < > 2.2* 2.3* 2.2*   < > = values in this interval not displayed.   No results for input(s): "LIPASE", "AMYLASE" in the last 168 hours. No results for input(s): "AMMONIA"  in the last 168 hours. CBC: Recent Labs  Lab 06/03/22 0415 06/04/22 0208 06/05/22 0402 06/06/22 0022 06/06/22 0431  WBC 61.3* 36.3* 30.3* 20.8* 18.8*  NEUTROABS 57.6* 32.6* 27.6* 18.5* 16.9*  HGB 9.9* 9.2* 8.6* 8.4* 8.4*  HCT 31.4* 29.0* 27.0* 27.0* 27.0*  MCV 78.7* 79.2* 78.7* 80.1 79.6*  PLT 324 211 189 188 168   Cardiac Enzymes: No results for input(s): "CKTOTAL", "CKMB", "CKMBINDEX", "TROPONINI" in the last 168 hours. CBG: Recent Labs  Lab 06/06/22 0347 06/06/22 0446 06/06/22 0546 06/06/22 0700 06/06/22 1114  GLUCAP 143* 160* 127* 173* 324*    Iron Studies: No results for input(s): "IRON", "TIBC", "TRANSFERRIN", "FERRITIN" in the last 72 hours. Studies/Results: No results found.  Chlorhexidine Gluconate Cloth  6 each Topical Daily   docusate  100 mg Per Tube BID   fentaNYL (SUBLIMAZE) injection  200 mcg Intravenous Once   insulin aspart  0-20 Units Subcutaneous Q4H   insulin aspart  5 Units Subcutaneous Q4H   insulin detemir  24 Units Subcutaneous BID   lidocaine-EPINEPHrine  20 mL Intradermal Once   midazolam  5 mg Intravenous Once   midodrine  5 mg Per Tube TID WC   multivitamin  1 tablet Per Tube QHS   mouth rinse  15 mL Mouth Rinse Q2H   pantoprazole sodium  40 mg Per Tube Daily   perampanel  4 mg Per Tube Q1200   PHENObarbital  65 mg Intravenous BID   polyethylene glycol  17 g Per Tube Daily   [START ON 06/07/2022] predniSONE  80 mg Per Tube Q breakfast   Followed by   Derrill Memo ON 06/09/2022] predniSONE  40 mg Per Tube Q breakfast   Followed by   Derrill Memo ON 06/11/2022] predniSONE  20 mg Per Tube Q breakfast   sodium chloride flush  10-40  mL Intracatheter Q12H    BMET    Component Value Date/Time   NA 135 06/06/2022 0431   NA 141 11/30/2021 1643   K 3.4 (L) 06/06/2022 0431   CL 102 06/06/2022 0431   CO2 26 06/06/2022 0431   GLUCOSE 149 (H) 06/06/2022 0431   BUN 43 (H) 06/06/2022 0431   BUN 15 11/30/2021 1643   CREATININE 0.92 06/06/2022 0431   CREATININE 0.76 04/05/2022 0830   CALCIUM 7.8 (L) 06/06/2022 0431   GFRNONAA >60 06/06/2022 0431   GFRNONAA >60 04/05/2022 0830   GFRAA >60 07/09/2020 0743   CBC    Component Value Date/Time   WBC 18.8 (H) 06/06/2022 0431   RBC 3.39 (L) 06/06/2022 0431   HGB 8.4 (L) 06/06/2022 0431   HGB 12.9 04/05/2022 0830   HCT 27.0 (L) 06/06/2022 0431   PLT 168 06/06/2022 0431   PLT 222 04/05/2022 0830   MCV 79.6 (L) 06/06/2022 0431   MCH 24.8 (L) 06/06/2022 0431   MCHC 31.1 06/06/2022 0431   RDW 20.8 (H) 06/06/2022 0431   LYMPHSABS 0.3 (L) 06/06/2022 0431   MONOABS 1.0 06/06/2022 0431   EOSABS 0.1 06/06/2022 0431   BASOSABS 0.0 06/06/2022 0431     Assessment/Plan: AKI - b/l creat 0.8 on admission. Creat rising now in setting of new onset shock. Pt admitted for viral meningoencephalitis complicated by critical cervical myopathy and now resp failure w/ multifocal PNA. Pt has lymphoma receiving active chemoRx (rituximab) w/ last dose in June 2023. AKI due to shock which started in last 24 hrs abruptly. CCM suspecting acute PE, s/p TPA and now on IV heparin. K+ up to 6.8 ->  CCRT 7/30 w/ all 2K fluids -> 4K now. No signs of renal recovery. Very minimal UOP.  Seen on CRRT with 2K fluids and adjust as needed. K currently 4.8 (had been 5.4 for 2 days).  On low dose Levophed 1-2 mcg (primarily bec of the heavy sedation) and tolerating 100 ml/hr (50-100). Now 5218 mL pos during hospitalization.   CRRT stopped early this morning.  We will see if renal function improves.   May need to transition to IHD Volume - mod edema on exam, I/O + 3 L since admit. Tolerated UF of 50-165m/hr. Shock -  maybe from multilobar PNA + viral menigoencephalitis, as above, on vasopressor support,  H/o lymphoma - getting active chemoRx w/ last dose rituxan in June 2023 AHRF - on vent, multifocal PNA on broad spec IV abx per CCM Status epilepticus on propofol AMS - in setting of viral meningoencephalitis  Cervical myelopathy  DM2 - on insulin  JDonetta Potts MD [facility]

## 2022-06-06 NOTE — Progress Notes (Signed)
OT Cancellation Note  Patient Details Name: Valerie Cline MRN: [id number] DOB: 02/14/1956   Cancelled Treatment:    Reason Eval/Treat Not Completed:  (Per RN pt remains unresponsive and not yet appropriate for therapy. Will return as schedule allows.)  [place], OTR/L Acute Rehab Office: [phone] 06/06/2022, 10:47 AM

## 2022-06-06 NOTE — Progress Notes (Signed)
LTM EEG RE-hooked after MRI and now up and running - skin breakdown was noted multiple times due to extended time on LTM EEG monitoring since 05/30/22.  Some Electrodes were re-located to avoid future breakdown. push button tested - neuro notified. [facility] continues to monitor.

## 2022-06-07 DIAGNOSIS — G039 Meningitis, unspecified: Secondary | ICD-10-CM | POA: Diagnosis not present

## 2022-06-07 DIAGNOSIS — G40901 Epilepsy, unspecified, not intractable, with status epilepticus: Secondary | ICD-10-CM | POA: Diagnosis not present

## 2022-06-07 DIAGNOSIS — R4182 Altered mental status, unspecified: Secondary | ICD-10-CM | POA: Diagnosis not present

## 2022-06-07 LAB — RENAL FUNCTION PANEL
Albumin: 2.2 g/dL — ABNORMAL LOW (ref 3.5–5.0)
Albumin: 2.2 g/dL — ABNORMAL LOW (ref 3.5–5.0)
Anion gap: 13 (ref 5–15)
Anion gap: 11 (ref 5–15)
BUN: 104 mg/dL — ABNORMAL HIGH (ref 8–23)
BUN: 97 mg/dL — ABNORMAL HIGH (ref 8–23)
CO2: 21 mmol/L — ABNORMAL LOW (ref 22–32)
CO2: 23 mmol/L (ref 22–32)
Calcium: 8.1 mg/dL — ABNORMAL LOW (ref 8.9–10.3)
Calcium: 7.7 mg/dL — ABNORMAL LOW (ref 8.9–10.3)
Chloride: 102 mmol/L (ref 98–111)
Chloride: 102 mmol/L (ref 98–111)
Creatinine, Ser: 2 mg/dL — ABNORMAL HIGH (ref 0.44–1.00)
Creatinine, Ser: 1.84 mg/dL — ABNORMAL HIGH (ref 0.44–1.00)
GFR, Estimated: 27 mL/min — ABNORMAL LOW (ref >60)
GFR, Estimated: 30 mL/min — ABNORMAL LOW (ref >60)
Glucose, Bld: 251 mg/dL — ABNORMAL HIGH (ref 70–99)
Glucose, Bld: 250 mg/dL — ABNORMAL HIGH (ref 70–99)
Phosphorus: 10.2 mg/dL — ABNORMAL HIGH (ref 2.5–4.6)
Phosphorus: 10.1 mg/dL — ABNORMAL HIGH (ref 2.5–4.6)
Potassium: 5.4 mmol/L — ABNORMAL HIGH (ref 3.5–5.1)
Potassium: 5.6 mmol/L — ABNORMAL HIGH (ref 3.5–5.1)
Sodium: 136 mmol/L (ref 135–145)
Sodium: 136 mmol/L (ref 135–145)

## 2022-06-07 LAB — CBC
HCT: 25.9 % — ABNORMAL LOW (ref 36.0–46.0)
Hemoglobin: 8.1 g/dL — ABNORMAL LOW (ref 12.0–15.0)
MCH: 25.3 pg — ABNORMAL LOW (ref 26.0–34.0)
MCHC: 31.3 g/dL (ref 30.0–36.0)
MCV: 80.9 fL (ref 80.0–100.0)
Platelets: 161 10*3/uL (ref 150–400)
RBC: 3.2 MIL/uL — ABNORMAL LOW (ref 3.87–5.11)
RDW: 21.3 % — ABNORMAL HIGH (ref 11.5–15.5)
WBC: 16.3 10*3/uL — ABNORMAL HIGH (ref 4.0–10.5)
nRBC: 0.3 % — ABNORMAL HIGH (ref 0.0–0.2)

## 2022-06-07 LAB — GLUCOSE, CAPILLARY
Glucose-Capillary: 143 mg/dL — ABNORMAL HIGH (ref 70–99)
Glucose-Capillary: 203 mg/dL — ABNORMAL HIGH (ref 70–99)
Glucose-Capillary: 214 mg/dL — ABNORMAL HIGH (ref 70–99)
Glucose-Capillary: 224 mg/dL — ABNORMAL HIGH (ref 70–99)
Glucose-Capillary: 229 mg/dL — ABNORMAL HIGH (ref 70–99)
Glucose-Capillary: 245 mg/dL — ABNORMAL HIGH (ref 70–99)
Glucose-Capillary: 261 mg/dL — ABNORMAL HIGH (ref 70–99)

## 2022-06-07 LAB — MAGNESIUM: Magnesium: 2.7 mg/dL — ABNORMAL HIGH (ref 1.7–2.4)

## 2022-06-07 LAB — IGG: IgG (Immunoglobin G), Serum: 703 mg/dL (ref 586–1602)

## 2022-06-07 LAB — HEPARIN LEVEL (UNFRACTIONATED): Heparin Unfractionated: 0.52 IU/mL (ref 0.30–0.70)

## 2022-06-07 MED ORDER — HEPARIN BOLUS VIA INFUSION
3000.0000 [IU] | Freq: Once | INTRAVENOUS | Status: DC
  Filled 2022-06-07: qty 3000

## 2022-06-07 MED ORDER — CEFAZOLIN SODIUM-DEXTROSE 2-4 GM/100ML-% IV SOLN
2.0000 g | INTRAVENOUS | Status: AC
Start: 2022-06-08 — End: 2022-06-08
  Administered 2022-06-08: 2 g via INTRAVENOUS
  Filled 2022-06-07: qty 100

## 2022-06-07 MED ORDER — MIDODRINE HCL 5 MG PO TABS
10.0000 mg | ORAL_TABLET | Freq: Three times a day (TID) | ORAL | Status: DC
Start: 2022-06-07 — End: 2022-06-15
  Administered 2022-06-07 – 2022-06-14 (×20): 10 mg
  Filled 2022-06-07 (×22): qty 2

## 2022-06-07 MED ORDER — SODIUM CHLORIDE 0.9 % IV SOLN
1.0000 g | Freq: Three times a day (TID) | INTRAVENOUS | Status: DC
Start: 2022-06-07 — End: 2022-06-07
  Filled 2022-06-07 (×2): qty 20

## 2022-06-07 MED ORDER — SODIUM CHLORIDE 0.9 % IV SOLN: 1.0000 g | INTRAVENOUS | Status: DC

## 2022-06-07 MED ORDER — MIDODRINE HCL 5 MG PO TABS
10.0000 mg | ORAL_TABLET | Freq: Three times a day (TID) | ORAL | Status: DC
Start: 2022-06-07 — End: 2022-06-07

## 2022-06-07 MED ORDER — PRISMASOL BGK 0/2.5 32-2.5 MEQ/L EC SOLN
Status: DC
  Filled 2022-06-07 (×5): qty 5000

## 2022-06-07 MED ORDER — INSULIN DETEMIR 100 UNIT/ML ~~LOC~~ SOLN
40.0000 [IU] | Freq: Two times a day (BID) | SUBCUTANEOUS | Status: DC
  Administered 2022-06-07 (×2): 40 [IU] via SUBCUTANEOUS
  Filled 2022-06-07 (×4): qty 0.4

## 2022-06-07 MED ORDER — SODIUM ZIRCONIUM CYCLOSILICATE 10 G PO PACK
10.0000 g | PACK | Freq: Once | ORAL | Status: DC
  Filled 2022-06-07: qty 1

## 2022-06-07 MED ORDER — PRISMASOL BGK 0/2.5 32-2.5 MEQ/L EC SOLN
Status: DC
  Filled 2022-06-07 (×17): qty 5000

## 2022-06-07 MED ORDER — HEPARIN (PORCINE) 25000 UT/250ML-% IV SOLN
1050.0000 [IU]/h | INTRAVENOUS | Status: DC
  Administered 2022-06-07: 1100 [IU]/h via INTRAVENOUS
  Administered 2022-06-08: 1050 [IU]/h via INTRAVENOUS
  Filled 2022-06-07: qty 250

## 2022-06-07 MED ORDER — HEPARIN (PORCINE) 2000 UNITS/L FOR CRRT
INTRAVENOUS_CENTRAL | Status: DC | PRN
  Administered 2022-06-07: 1800 mL via INTRAVENOUS_CENTRAL

## 2022-06-07 MED ORDER — SODIUM ZIRCONIUM CYCLOSILICATE 10 G PO PACK
10.0000 g | PACK | Freq: Once | ORAL | Status: AC
  Administered 2022-06-07: 10 g

## 2022-06-07 MED ORDER — HEPARIN BOLUS VIA INFUSION
2000.0000 [IU] | Freq: Once | INTRAVENOUS | Status: AC
  Administered 2022-06-07: 2000 [IU] via INTRAVENOUS
  Filled 2022-06-07: qty 2000

## 2022-06-07 MED ORDER — HEPARIN SODIUM (PORCINE) 1000 UNIT/ML DIALYSIS
1000.0000 [IU] | INTRAMUSCULAR | Status: DC | PRN
  Administered 2022-06-08 (×2): 1200 [IU] via INTRAVENOUS_CENTRAL
  Administered 2022-06-14: 2400 [IU] via INTRAVENOUS_CENTRAL
  Filled 2022-06-07: qty 6
  Filled 2022-06-07: qty 3
  Filled 2022-06-07 (×2): qty 6

## 2022-06-07 NOTE — Progress Notes (Signed)
Subjective: Continues to be comatose.  Back on CRRT.  Sister at bedside.  ROS: Unable to obtain due to poor mental status  Examination  Vital signs in last 24 hours: Temp:  [98.2 F (36.8 C)-99.3 F (37.4 C)] 98.7 F (37.1 C) (08/08 0756) Pulse Rate:  [67-113] 103 (08/08 1000) Resp:  [10-36] 29 (08/08 1000) BP: (89-149)/(44-57) 112/46 (08/08 1000) SpO2:  [94 %-99 %] 97 % (08/08 1000) FiO2 (%):  [40 %] 40 % (08/08 0800)  General: lying in bed, NAD Neuro:  comatose, pupils 3 equal and reactive to light, subtle corneal reflex present, initiating breaths on pressor support, does not withdraw to noxious stimuli in all extremities  Basic Metabolic Panel: Recent Labs  Lab 06/03/22 0415 06/03/22 1600 06/04/22 0208 06/04/22 1600 06/05/22 0402 06/05/22 1606 06/06/22 0431 06/07/22 0350  NA 135   < > 137 137 136 136 135 136  K 5.3*   < > 5.4* 5.1 4.8 4.3 3.4* 5.4*  CL 103   < > 105 104 103 103 102 102  CO2 23   < > '26 25 25 25 26 '$ 21*  GLUCOSE 193*   < > 156* 133* 180* 159* 149* 251*  BUN 40*   < > 45* 55* 50* 47* 43* 104*  CREATININE 1.12*   < > 1.10* 1.23* 1.01* 0.95 0.92 2.00*  CALCIUM 7.9*   < > 8.0* 7.8* 7.7* 7.8* 7.8* 8.1*  MG 2.6*  --  2.6*  --  2.5*  --  2.4 2.7*  PHOS 4.2   < > 3.7 5.3* 4.3 4.4 3.6 10.2*   < > = values in this interval not displayed.    CBC: Recent Labs  Lab 06/03/22 0415 06/04/22 0208 06/05/22 0402 06/06/22 0022 06/06/22 0431 06/07/22 0350  WBC 61.3* 36.3* 30.3* 20.8* 18.8* 16.3*  NEUTROABS 57.6* 32.6* 27.6* 18.5* 16.9*  --   HGB 9.9* 9.2* 8.6* 8.4* 8.4* 8.1*  HCT 31.4* 29.0* 27.0* 27.0* 27.0* 25.9*  MCV 78.7* 79.2* 78.7* 80.1 79.6* 80.9  PLT 324 211 189 188 168 161    Coagulation Studies: No results for input(s): "LABPROT", "INR" in the last 72 hours.  Imaging MRI brain without contrast 06/06/2022: Persistent findings of meningitis. Extent of abnormal sulcal signal has progressed. Mildly increase in caliber of ventricles may reflect  communicating hydrocephalus. Patchy foci of abnormal cortical/subcortical diffusion may reflect acute ischemia, seizure related changes, and/or encephalitis.   ASSESSMENT AND PLAN: 66 year old female with suspected viral meningo-encephalitis (had hand-foot-and-mouth disease prior to presentation) now with status epilepticus.   Focal convulsive status epilepticus, resolved Provoked seizures Acute encephalopathy, infectious Viral meningo-encephalitis Left meningioma Acute ischemic stroke, incidental Obstructive hydrocephalus -Status epilepticus likely due to viral encephalitis, meningioma (meningiomas do not commonly cause status epilepticus but the location of this meningioma is concerning).   -Ischemic strokes and likely due to encephalitis   Recommendations -Continue Keppra 1 g twice daily, Vimpat 200 mg twice daily, perampanel 4 mg daily and phenobarbital 65 mg twice daily -If seizures recur, can increase Keppra (if renal function improving) or increase phenobarbital -Continue LTM EEG to look for intermittent seizures -Discussed MRI findings with sister at bedside.  Patient has been off sedation for over 72 hours with minimal change in mental status (has cranial nerve reflexes but continues to be comatose, not withdrawing in all extremities).  I discussed my concern about significant neurologic injury.  Discussed with ID as well as critical care team.  We will continue to wait for 1 more  week to look for improvement and visit goals of care at that time  -Continue seizure precautions   I have spent a total of 55 minutes with the patient reviewing hospital notes,  test results, labs and examining the patient as well as establishing an assessment and plan.  > 50% of time was spent in direct patient care.  Zeb Comfort Epilepsy [facility] For questions after 5pm please refer to AMION to reach the Neurologist on call

## 2022-06-07 NOTE — Progress Notes (Signed)
Inpatient Diabetes Program Recommendations  AACE/ADA: New Consensus Statement on Inpatient Glycemic Control (2015)  Target Ranges:  Prepandial:   less than 140 mg/dL      Peak postprandial:   less than 180 mg/dL (1-2 hours)      Critically ill patients:  140 - 180 mg/dL   Lab Results  Component Value Date   GLUCAP 214 (H) 06/07/2022   HGBA1C 8.0 (H) 05/24/2022    Review of Glycemic Control  Latest Reference Range & Units 06/06/22 11:14 06/06/22 16:33 06/06/22 20:37 06/06/22 23:59 06/07/22 03:53 06/07/22 08:00  Glucose-Capillary 70 - 99 mg/dL 324 (H) 347 (H) 260 (H) 261 (H) 245 (H) 214 (H)  Diabetes history: DM 2 Outpatient Diabetes medications:  U500 insulin-80 units with breakfast, 50 units with lunch, and 90 units with supper Current orders for Inpatient glycemic control:  Levemir 40 units bid Novolog 8 units q 4 hours Novolog 0-20 units q 4 hours Vital 60 ml/hr Inpatient Diabetes Program Recommendations:    Agree with increase in Levemir.  Will continue to follow.   Thanks,  Adah Perl, RN, BC-ADM Inpatient Diabetes Coordinator Pager [phone]  (8a-5p)

## 2022-06-07 NOTE — Progress Notes (Signed)
OT Cancellation Note  Patient Details Name: Valerie Cline MRN: [id number] DOB: April 18, 1956   Cancelled Treatment:    Reason Eval/Treat Not Completed: Patient not medically ready (Remains unresponsive. Signing off at this time. Please reorder when appropriate. thanks)  [facility] 06/07/2022, 8:20 AM Maurie Boettcher, OT/L   Acute OT Clinical Specialist [place] Pager [phone] Office [phone]

## 2022-06-07 NOTE — Consult Note (Signed)
Chief Complaint: Patient was seen in consultation today for g-tube request at the request of critical care.  Referring Physician(s): Ina Homes, MD  Supervising Physician: Mir, Sharen Heck  Patient Status: [facility] - In-pt  History of Present Illness: Valerie Cline is a 66 y.o. 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    She initially presented to OSH on 05/20/22 for left upper extremity tremors. She reported recent hand foot and mouth disease contracted from her grandchild that left her weak and tired prior to this admission. Neurology work-up including MRI, EEG negative. MRI/MRA neg for acute infarct, 13 mm focus in left frontal lobe representing artifact vs small calcified meningioma, chronic small vessel ischemic changes, cerebral atrophy, high-grade stenosis in bilateral ICA, focal stenosis in left ACA and intracranial right vertebral artery. MRI of C-spine showing C4-5 cord compression from advanced disc degeneration with posterior endplate ridging.  Showing left foraminal impingement at the same level.  Also showing C5-6 and C6-7 ACDF with solid arthrodesis.  Neurologist at [facility] had recommended neurosurgical evaluation.   She developed fevers and transferred to ICU concerning for meningitis and started on Vanc, rocephin, ampicillin and acyclovir. LP was performed on 7/24 with WBC 55 RBC 2. PCCM consulted for decreased responsiveness and hypoxemia on NRB. On exam obtunded and only able to say name. O2 sats in the 70s on NRB. Transferred to the ICU for emergent intubation.  Status thought to be from viral meningoencephalitis and then developed multiorgan failure on 7/29-30. On 8/4 she was trach'd.    She has remained primarily unresponsive and required NG tube feed support.  Given persistent mental status, IR  has been requested to evaluate for g-tube placement.  Past Medical History:  Diagnosis Date   Allergy    Atherosclerotic heart disease of native coronary artery without angina pectoris 01/13/2020   Blood in stool    Carotid artery occlusion 08/13/2009   Carotid stenosis    Cervical lymphadenopathy 10/31/2019   CLL (chronic lymphocytic leukemia) ([place]) 35/59/7416   Complication of anesthesia    Coronary artery calcification seen on CT scan 06/01/2020   Diabetes mellitus without complication ([place])    Diabetic peripheral neuropathy associated with type 2 diabetes mellitus ([place]) 06/08/2009   Elevated liver function tests    Essential hypertension 11/08/2019   Fatty liver 09/06/2012   Goals of care, counseling/discussion 10/28/2019   Hardening of the aorta (main artery of the heart) ([place]) 01/13/2020   History of adenomatous polyp of colon 03/16/2021   Hyperglycemia due to type 2 diabetes mellitus ([place]) 03/09/2021   Hyperlipidemia    Hypertension    Hypertriglyceridemia 03/16/2021   Long term (current) use of insulin ([place]) 03/09/2021   Lumbar radiculopathy 11/30/2021   Lymphadenopathy, abdominal 10/28/2019   Malignant lymphoma, small lymphocytic () 11/13/2019   Mixed dyslipidemia 06/01/2020   Obesity 04/19/2012   Personal history of COVID-19 11/13/2019   Pneumonia due to COVID-19 virus 11/08/2019   PONV (postoperative nausea and vomiting)    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 ([place]) 12/30/2019   T2DM (type 2 diabetes mellitus) ([place]) 11/08/2019   Thyroid nodule 03/16/2021   Type 2 diabetes mellitus with unspecified complications ([place]) 38/45/3646    Past Surgical History:  Procedure Laterality Date   BREAST BIOPSY Right    needle biopsy   COLONOSCOPY  POLYPECTOMY     Ruptured disc      Allergies: Canagliflozin, Glucophage [metformin], and Niaspan [niacin]  Medications: Prior to Admission medications   Medication Sig Start Date End Date Taking?  Authorizing Provider  amLODipine (NORVASC) 2.5 MG tablet TAKE 1 TABLET BY MOUTH EVERY DAY Patient taking differently: Take 2.5 mg by mouth daily. 09/22/21  Yes Volanda Napoleon, MD  aspirin EC 81 MG tablet Take 81 mg by mouth daily. Swallow whole.   Yes [provider]  benazepril (LOTENSIN) 20 MG tablet Take 20 mg by mouth at bedtime.   Yes [provider]  Dulaglutide 3 MG/0.5ML SOPN Inject 3 mg into the skin once a week. NO certain day   Yes [provider]  famciclovir (FAMVIR) 500 MG tablet Take 1 tablet (500 mg total) by mouth daily. 12/16/21  Yes Ennever, Rudell Cobb, MD  hydrochlorothiazide (HYDRODIURIL) 25 MG tablet Take 25 mg by mouth at bedtime. 11/11/21  Yes [provider]  icosapent Ethyl (VASCEPA) 1 g capsule Take 2 g by mouth 2 (two) times daily.   Yes [provider]  insulin regular human CONCENTRATED (HUMULIN R) 500 UNIT/ML injection Inject 50-90 Units into the skin See admin instructions. Inject 80 units subcutaneously before breakfast, and 50 units before lunch and 90 units for supper   Yes [provider]  Multiple Vitamins-Minerals (MULTIVITAMIN WITH MINERALS) tablet Take 1 tablet by mouth daily.   Yes [provider]  potassium chloride SA (KLOR-CON M) 20 MEQ tablet Take 20 mEq by mouth 2 (two) times daily. 02/18/22  Yes [provider]  rosuvastatin (CRESTOR) 20 MG tablet Take 1 tablet (20 mg total) by mouth daily. 01/13/22  Yes Jerline Pain, MD  Vitamin D3 (VITAMIN D) 25 MCG tablet Take 1,000 Units by mouth daily.   Yes [provider]  ACCU-CHEK GUIDE test strip 3 (three) times daily. 03/22/22   [provider]  BD INSULIN SYRINGE U-500 31G X 6MM 0.5 ML MISC 3 (three) times daily. as directed 03/23/22   [provider]  Continuous Blood Gluc Sensor (DEXCOM G7 SENSOR) MISC CHANGE SENSOR EVERY 10 DAYS 02/12/22   [provider]  prochlorperazine (COMPAZINE) 10 MG tablet Take 1  tablet (10 mg total) by mouth every 6 (six) hours as needed (Nausea or vomiting). 02/11/20 08/27/20  Volanda Napoleon, MD     Family History  Problem Relation Age of Onset   Colon cancer Neg Hx    Esophageal cancer Neg Hx    Rectal cancer Neg Hx    Stomach cancer Neg Hx    Pancreatic cancer Neg Hx     Social History   Socioeconomic History   Marital status: Married    Spouse name: Not on file   Number of children: 1   Years of education: Not on file   Highest education level: Not on file  Occupational History   Occupation: Engineer, maintenance (IT)  Tobacco Use   Smoking status: Never   Smokeless tobacco: Never  Vaping Use   Vaping Use: Never used  Substance and Sexual Activity   Alcohol use: No   Drug use: No   Sexual activity: Not on file  Other Topics Concern   Not on file  Social History Narrative   Not on file   Social Determinants of Health   Financial Resource Strain: Not on file  Food Insecurity: Not on file  Transportation Needs: Not on file  Physical Activity: Not on file  Stress: Not on file  Social Connections: Not on file    Review of Systems: unable to perform  Vital Signs: BP (!) 107/49   Pulse 91   Temp 98.8 F (37.1 C) (Oral)   Resp (!) 27   Ht 5' 5.98" (1.676 m)   Wt 192 lb 10.9 oz (87.4 kg)   SpO2 97%   BMI 31.12 kg/m    Physical Exam Constitutional:      Appearance: She is ill-appearing.     Comments: Unresponsive Trach/vent dependent Multiple drips  Cardiovascular:     Pulses: Normal pulses.  Abdominal:     General: Abdomen is flat.     Palpations: Abdomen is soft.  Musculoskeletal:     Right lower leg: Edema present.     Left lower leg: Edema present.  Skin:    General: Skin is dry.     Coloration: Skin is pale.  Neurological:     Mental Status: She is unresponsive.     Imaging: MR BRAIN WO CONTRAST  Result Date: 06/06/2022 CLINICAL DATA:  Meningitis/CNS infection suspected EXAM: MRI HEAD WITHOUT CONTRAST  TECHNIQUE: Multiplanar, multiecho pulse sequences of the brain and surrounding structures were obtained without intravenous contrast. COMPARISON:  05/25/2022 FINDINGS: Brain: Persistent abnormal focal T2 FLAIR hyperintensity. This has progressed since the prior study. Ventricle caliber is mildly increased and could reflect communicating hydrocephalus. There are patchy foci of cortical/subcortical diffusion hyperintensity particularly in the left parietal, posterior temporal, and occipital lobes. Patchy foci of T2 hyperintensity in the supratentorial white matter may reflect chronic microvascular ischemic changes. Left vertex dural-based lesion on the prior study is likely similar though not as well evaluated on this noncontrast study. No mass effect.  No extra-axial collection. Vascular: Major vessel flow voids at the skull base are preserved. Skull and upper cervical spine: Normal marrow signal is preserved. Sinuses/Orbits: Minor mucosal thickening.  Orbits are unremarkable. Other: Sella is unremarkable. Nonspecific mastoid effusions. Retained secretions in the included pharynx. IMPRESSION: Persistent findings of meningitis. Extent of abnormal sulcal signal has progressed. Mildly increase in caliber of ventricles may reflect communicating hydrocephalus. Patchy foci of abnormal cortical/subcortical diffusion may reflect acute ischemia, seizure related changes, and/or encephalitis. Electronically Signed   By: Macy Mis M.D.   On: 06/06/2022 15:16   CT CHEST ABDOMEN PELVIS W CONTRAST  Result Date: 06/04/2022 CLINICAL DATA:  Abdominal pain. EXAM: CT CHEST, ABDOMEN, AND PELVIS WITH CONTRAST TECHNIQUE: Multidetector CT imaging of the chest, abdomen and pelvis was performed following the standard protocol during bolus administration of intravenous contrast. RADIATION DOSE REDUCTION: This exam was performed according to the departmental dose-optimization program which includes automated exposure control, adjustment  of the mA and/or kV according to patient size and/or use of iterative reconstruction technique. CONTRAST:  157m OMNIPAQUE IOHEXOL 300 MG/ML  SOLN COMPARISON:  09/03/2020. Chest radiograph an abdomen radiographs, 06/03/2022. FINDINGS: CT CHEST FINDINGS Cardiovascular: Heart normal in size. No pericardial effusion. Three-vessel coronary artery calcifications. Great vessels are normal in caliber. Aortic atherosclerosis. No dissection. Vague filling defect in the peripheral right pulmonary artery extending to the right lower lobe pulmonary artery and segmental branches. This could reflect pulmonary emboli, but is equivocal Mediastinum/Nodes: Stable, previously assessed, left thyroid gland nodule/enlargement. No mediastinal or hilar masses. No enlarged lymph nodes. Endotracheal tube tip projects 4.7 cm above the carina. Nasal/orogastric tube tip lies in the distal stomach. Lungs/Pleura: Patchy right lower lobe consolidation. Mild associated bronchiectasis. Milder opacity in the dependent posteromedial left lower lobe, consistent with atelectasis. There  is atelectasis or scarring in dependent right middle lobe and left upper lobe lingula, with associated bronchiectasis. There are additional bilateral areas of ground-glass opacity as well as linear opacities. These findings are new compared to the prior chest CT from 09/03/2020. Trace, right greater than left, pleural effusions. No pneumothorax. Musculoskeletal: No fracture or acute finding.  No bone lesion. CT ABDOMEN PELVIS FINDINGS Hepatobiliary: Normal liver. Small dependent gallstone. No acute cholecystitis. No bile duct dilation. Pancreas: Unremarkable. No pancreatic ductal dilatation or surrounding inflammatory changes. Spleen: Normal in size without focal abnormality. Adrenals/Urinary Tract: No adrenal masses. Kidneys normal in overall size, orientation and position. No renal mass or stone. No hydronephrosis. Normal ureters. Bladder decompressed with a Foley  catheter. Stomach/Bowel: Normal stomach. Decompressed small bowel. Scattered colonic air-fluid levels. No bowel wall thickening. No inflammation. Rectal tube in place. Vascular/Lymphatic: Aortic atherosclerosis. No aneurysm. No enlarged lymph nodes. Reproductive: Uterus and bilateral adnexa are unremarkable. Other: Small amount of ascites. Subcutaneous edema along the lower abdomen and pelvis. Musculoskeletal: Chronic bilateral pars defects at L5-S1 with a grade 2 to grade 3 anterolysis. No acute fracture. Status post posterior fusion, L1, L2 and L3. This is new compared to the prior CT. No bone lesion. IMPRESSION: 1. Possible pulmonary emboli involving the right peripheral pulmonary artery/right lower lobe pulmonary arteries. Consider follow-up CTA chest for further assessment if this patient can tolerate a repeat contrast dose. 2. Bilateral lung opacities, most confluent in the right lower lobe, suspected to be multifocal pneumonia. 3. Trace bilateral pleural effusions. 4. Aortic atherosclerosis and 3 vessel coronary artery calcifications. 5. Small amount of ascites. 6. No acute abnormality within the abdomen or pelvis. Electronically Signed   By: Lajean Manes M.D.   On: 06/04/2022 10:28   DG Abd Portable 1V  Result Date: 06/03/2022 CLINICAL DATA:  NGT EXAM: PORTABLE ABDOMEN - 1 VIEW COMPARISON:  None Available. FINDINGS: Nonobstructive pattern of included bowel gas. Non weighted enteric feeding tube is positioned with tip below the diaphragm, in the vicinity of the pylorus or duodenal bulb. IMPRESSION: Non weighted enteric feeding tube is positioned with tip below the diaphragm, in the vicinity of the pylorus or duodenal bulb. Consider advancement if post pyloric placement is desired. Electronically Signed   By: Delanna Ahmadi M.D.   On: 06/03/2022 15:26   DG Chest Port 1 View  Result Date: 06/03/2022 CLINICAL DATA:  Status post tracheostomy. EXAM: PORTABLE CHEST 1 VIEW COMPARISON:  05/29/2022 FINDINGS:  Removal of endotracheal tube with placement of tracheostomy since the prior chest x-ray. Tracheostomy appearance by x-ray appears appropriate. Non tunneled large bore central venous catheter remains in place with the catheter tip in the SVC. Improved aeration of the right lung with some residual atelectasis or infiltrate remaining at the right lung base. No pulmonary edema or pleural fluid identified. No pneumothorax. IMPRESSION: Tracheostomy appears appropriate by chest x-ray. Improved aeration of the right lung with residual atelectasis or infiltrate at the right lung base. Electronically Signed   By: Aletta Edouard M.D.   On: 06/03/2022 15:23   CT HEAD WO CONTRAST (5MM)  Result Date: 06/01/2022 CLINICAL DATA:  Meningitis/CNS infection suspected EXAM: CT HEAD WITHOUT CONTRAST TECHNIQUE: Contiguous axial images were obtained from the base of the skull through the vertex without intravenous contrast. RADIATION DOSE REDUCTION: This exam was performed according to the departmental dose-optimization program which includes automated exposure control, adjustment of the mA and/or kV according to patient size and/or use of iterative reconstruction technique. COMPARISON:  05/29/2022 CT  head, 05/25/2022 MRI, CT head 05/20/2022 FINDINGS: Brain: Again noted is poor visualization of the sulci in the parietal and occipital lobes. No evidence of acute infarction, hemorrhage, mass, mass effect, or midline shift. No hydrocephalus or definite extra-axial fluid collection. Vascular: No hyperdense vessel. Atherosclerotic calcifications in the intracranial carotid and vertebral arteries. Skull: Normal. Negative for fracture or focal lesion. Sinuses/Orbits: No acute finding. Other: Fluid in the right-greater-than-left left mastoid air cells. IMPRESSION: Poor visualization of the parietal and occipital sulci, similar to 05/29/2022, which is consistent with the abnormal CSF signal and suspected meningitis seen on the 05/25/2022 MRI,  although no discrete extra-axial collection is seen. Electronically Signed   By: Merilyn Baba M.D.   On: 06/01/2022 13:12   Overnight EEG with video  Result Date: 05/31/2022 Lora Havens, MD     05/31/2022  2:52 PM Patient Name: Valerie Cline MRN: [id number] Epilepsy Attending: Lora Havens Referring Physician/Provider: Greta Doom, MD  Duration: 05/30/2022 1014 to 05/31/2022 1014  Patient history: 66yo F with ams. EEG to evaluate for seizure  Level of alertness: lethargic, asleep  AEDs during EEG study: propofol, Keppra, Vimpat  Technical aspects: This EEG study was done with scalp electrodes positioned according to the 10-20 International system of electrode placement. Electrical activity was acquired at a sampling rate of '500Hz'$  and reviewed with a high frequency filter of '70Hz'$  and a low frequency filter of '1Hz'$ . EEG data were recorded continuously and digitally stored.  Description: At the beginning of the study, EEG showed seizures arising from left posterior quadrant. During the seizure, initially patient had no clinical signs.  She then had sudden brief eye-opening.  Concomitant EEG showed spike and wave in left posterior quadrant which then involved all of left hemisphere and right posterior quadrant. Nine seizures were noted between 1015 to 1500 on 05/30/2022.  After 1500, frequency of seizures gradually worsened.  IV Vimpat was added.  However, EEG continued to worsen and showed status epilepticus.  At times patient was noted to have eye-opening and full body jerking but during most of the EEG, patient did not have any clinical signs.  IV propofol was added without resolution of status epilepticus. ABNORMALITY -Convulsive status epilepticus, left posterior quadrant  IMPRESSION: This study showed status epilepticus arising from left posterior quadrant.  During most of the EEG, no clinical signs were seen.  However at times patient was noted to have eye-opening and whole body jerking.  Priyanka [name]   CT HEAD WO CONTRAST (5MM)  Result Date: 05/29/2022 CLINICAL DATA:  Mental status changes. Persistent or worsening. Suspected viral meningitis. EXAM: CT HEAD WITHOUT CONTRAST TECHNIQUE: Contiguous axial images were obtained from the base of the skull through the vertex without intravenous contrast. RADIATION DOSE REDUCTION: This exam was performed according to the departmental dose-optimization program which includes automated exposure control, adjustment of the mA and/or kV according to patient size and/or use of iterative reconstruction technique. COMPARISON:  CT head without contrast 05/20/2022. MR head without and with contrast 05/25/2022 FINDINGS: Brain: Sulci are poorly seen over the posterior parietal lobes and occipital lobes. Mild atrophy and white matter changes are present. Ventricles are stable in size. No discrete extra-axial fluid collections are present. The brainstem and cerebellum are within normal limits. Vascular: Atherosclerotic calcifications are again seen within the cavernous internal carotid arteries. No hyperdense vessel present. Skull: Calvarium is intact. No focal lytic or blastic lesions are present. No significant extracranial soft tissue lesion is present. Sinuses/Orbits: Right greater than  left mastoid effusions are new. No significant middle ear effusions are present. The paranasal sinuses and mastoid air cells are otherwise clear. The globes and orbits are within normal limits. IMPRESSION: 1. Sulci are poorly seen over the posterior parietal lobes and occipital lobes. This is consistent with abnormal CSF signal and suspected meningitis. 2. No discrete extra-axial fluid collections. 3. Stable atrophy and white matter disease. 4. New right greater than left mastoid effusions. Electronically Signed   By: San Morelle M.D.   On: 05/29/2022 17:33   DG Chest Port 1 View  Result Date: 05/29/2022 CLINICAL DATA:  Evaluate line placement. EXAM: PORTABLE CHEST  1 VIEW COMPARISON:  May 29, 2022 FINDINGS: The ETT is in good position. A right central line terminates in the central SVC. A left PICC line terminates in the central SVC. No pneumothorax. Bilateral patchy pulmonary infiltrates have worsened in the interval, particularly in the right mid lung. The cardiomediastinal silhouette is stable. No other acute abnormalities. IMPRESSION: 1. Support apparatus as above. Specifically, the new right central line is in good position with no pneumothorax. 2. Worsening bilateral pulmonary infiltrates, particularly in the right mid lung. Electronically Signed   By: Dorise Bullion III M.D.   On: 05/29/2022 09:50   VAS [place] LOWER EXTREMITY VENOUS (DVT)  Result Date: 05/29/2022  Lower Venous DVT Study Patient Name:  LILIYA FULLENWIDER  Date of Exam:   05/28/2022 Medical Rec #: [id number]       Accession #:    [id number] Date of Birth: 06-05-1956       Patient Gender: F Patient Age:   65 years Exam Location:  [facility] Procedure:      VAS [place] LOWER EXTREMITY VENOUS (DVT) Referring Phys: Ina Homes --------------------------------------------------------------------------------  Indications: Edema.  Risk Factors: Ventilation. Limitations: Ventilation. Enlarged bladder. Positioning. Comparison Study: No prior study Performing Technologist: Sharion Dove RVS  Examination Guidelines: A complete evaluation includes B-mode imaging, spectral Doppler, color Doppler, and power Doppler as needed of all accessible portions of each vessel. Bilateral testing is considered an integral part of a complete examination. Limited examinations for reoccurring indications may be performed as noted. The reflux portion of the exam is performed with the patient in reverse Trendelenburg.  +---------+---------------+---------+-----------+----------+--------------+ RIGHT    CompressibilityPhasicitySpontaneityPropertiesThrombus Aging  +---------+---------------+---------+-----------+----------+--------------+ CFV      Full           No       No                                  +---------+---------------+---------+-----------+----------+--------------+ SFJ      Partial                                                     +---------+---------------+---------+-----------+----------+--------------+ FV Prox  Full           No       No                                  +---------+---------------+---------+-----------+----------+--------------+ FV Mid   Full           No       No                                  +---------+---------------+---------+-----------+----------+--------------+  FV DistalFull           No       No                                  +---------+---------------+---------+-----------+----------+--------------+ PFV      Full           No       No                                  +---------+---------------+---------+-----------+----------+--------------+ POP      Full           No       No                                  +---------+---------------+---------+-----------+----------+--------------+ PTV      Full           No       No                                  +---------+---------------+---------+-----------+----------+--------------+ PERO     Full           No       No                                  +---------+---------------+---------+-----------+----------+--------------+ Gastroc                 No       No                                  +---------+---------------+---------+-----------+----------+--------------+                                                                      +---------+---------------+---------+-----------+----------+--------------+   +---------+---------------+---------+-----------+----------+--------------+ LEFT     CompressibilityPhasicitySpontaneityPropertiesThrombus Aging  +---------+---------------+---------+-----------+----------+--------------+ CFV      Full           No       No                                  +---------+---------------+---------+-----------+----------+--------------+ SFJ      Partial                                                     +---------+---------------+---------+-----------+----------+--------------+ FV Prox  Full           No       No                                  +---------+---------------+---------+-----------+----------+--------------+  FV Mid   Full           No       No                                  +---------+---------------+---------+-----------+----------+--------------+ FV DistalFull           No       No                                  +---------+---------------+---------+-----------+----------+--------------+ PFV      Full                                                        +---------+---------------+---------+-----------+----------+--------------+ POP      Full           No       No                                  +---------+---------------+---------+-----------+----------+--------------+ PTV      Full                                                        +---------+---------------+---------+-----------+----------+--------------+ PERO     Full                                                        +---------+---------------+---------+-----------+----------+--------------+ EIV mid  Partial                                                     +---------+---------------+---------+-----------+----------+--------------+     Summary: RIGHT: - Vessels compress, however, they are dilated and waveforms are continuous. IVC is patent as is distal right common iliac vein.  LEFT: - Vessels compress, however, they are dilated and waveforms are continuous. Mid external iliac vein is partially compressible with no color flow noted.  *See table(s) above for measurements and  observations. Electronically signed by Jamelle Haring on 05/29/2022 at 9:23:40 AM.    Final    ECHOCARDIOGRAM LIMITED  Result Date: 05/29/2022    ECHOCARDIOGRAM LIMITED REPORT   Patient Name:   CANTRELL MARTUS Date of Exam: 05/29/2022 Medical Rec #:  [id number]      Height:       66.0 in Accession #:    [id number]     Weight:       195.5 lb Date of Birth:  1956/10/14      BSA:          1.981 m Patient Age:    30 years       BP:  153/61 mmHg Patient Gender: F              HR:           114 bpm. Exam Location:  Inpatient Procedure: Limited Echo, Color Doppler and Cardiac Doppler STAT ECHO Indications:    Acute Ischemic Heart Disease  History:        Patient has prior history of Echocardiogram examinations, most                 recent 05/28/2022. Risk Factors:Hypertension, Diabetes and                 Dyslipidemia.  Sonographer:    Raquel Sarna Senior RDCS Referring Phys: [id number] [place]  1. Left ventricular ejection fraction, by estimation, is 70 to 75%. The left ventricle has hyperdynamic function. Left ventricular endocardial border not optimally defined to evaluate regional wall motion. Left ventricular diastolic function could not be evaluated. There is the interventricular septum is flattened in systole and diastole, consistent with right ventricular pressure and volume overload.  2. There is a suggestion of McConnell's sign, which is seen with acute cor pulmonale (e.g. pulmonary embolism, acue respiratory failure, etc). Right ventricular systolic function is moderately reduced. The right ventricular size is mildly enlarged. There is moderately elevated pulmonary artery systolic pressure. The estimated right ventricular systolic pressure is 84.6 mmHg.  3. Right atrial size was moderately dilated.  4. No evidence of mitral valve regurgitation. Moderate mitral annular calcification.  5. Tricuspid valve regurgitation is moderate.  6. The aortic valve was not well visualized. Aortic valve  regurgitation is not visualized. No aortic stenosis is present.  7. The inferior vena cava is dilated in size with <50% respiratory variability, suggesting right atrial pressure of 15 mmHg. Comparison(s): Prior images reviewed side by side. The left ventricular function is unchanged. There is marked deterioration in right ventricular function. The inferior vena cava is now plethoric. Findings are consistent with acute right heart failure, possibly due to acute pulmonary embolism or other cause of abrupt and severe increase in pulmonary vascular resistance. FINDINGS  Left Ventricle: Left ventricular ejection fraction, by estimation, is 70 to 75%. The left ventricle has hyperdynamic function. Left ventricular endocardial border not optimally defined to evaluate regional wall motion. The left ventricular internal cavity size was small. There is no left ventricular hypertrophy. The interventricular septum is flattened in systole and diastole, consistent with right ventricular pressure and volume overload. Left ventricular diastolic function could not be evaluated. Right Ventricle: There is a suggestion of McConnell's sign, which is seen with acute cor pulmonale (e.g. pulmonary embolism, acue respiratory failure, etc). The right ventricular size is mildly enlarged. Right ventricular systolic function is moderately reduced. There is moderately elevated pulmonary artery systolic pressure. The tricuspid regurgitant velocity is 2.99 m/s, and with an assumed right atrial pressure of 15 mmHg, the estimated right ventricular systolic pressure is 96.2 mmHg. Left Atrium: Left atrial size was normal in size. Right Atrium: Right atrial size was moderately dilated. Pericardium: There is no evidence of pericardial effusion. Mitral Valve: Moderate mitral annular calcification. Tricuspid Valve: Tricuspid valve regurgitation is moderate. Aortic Valve: The aortic valve was not well visualized. Aortic valve regurgitation is not  visualized. No aortic stenosis is present. Pulmonic Valve: The pulmonic valve was not well visualized. Venous: The inferior vena cava is dilated in size with less than 50% respiratory variability, suggesting right atrial pressure of 15 mmHg. IAS/Shunts: The atrial septum is grossly normal. RIGHT VENTRICLE RV  S prime:     8.81 cm/s TAPSE (M-mode): 1.4 cm AORTIC VALVE LVOT Vmax:   94.80 cm/s LVOT Vmean:  69.700 cm/s LVOT VTI:    0.105 m TRICUSPID VALVE TR Peak grad:   35.8 mmHg TR Vmax:        299.00 cm/s  SHUNTS Systemic VTI: 0.10 m Dani Gobble Croitoru MD Electronically signed by Sanda Klein MD Signature Date/Time: 05/29/2022/8:41:23 AM    Final    US Abdomen Limited RUQ (LIVER/GB)  Result Date: 05/29/2022 CLINICAL DATA:  66 year old female with abnormal LFTs. EXAM: ULTRASOUND ABDOMEN LIMITED RIGHT UPPER QUADRANT COMPARISON:  CT Abdomen and Pelvis 09/03/2020. FINDINGS: Gallbladder: Sludge and shadowing echogenic gallstones, individually up to 10 mm (image 12). Mild gallbladder wall thickening of up to 4 mm with trace pericholecystic fluid. Gallbladder appears partially contracted. No sonographic Murphy sign elicited. Common bile duct: Diameter: 2-3 mm, normal. Liver: Small volume perihepatic ascites. No discrete liver lesion, but nodular liver contour such as on image 29. Portal vein is patent on color Doppler imaging with normal direction of blood flow towards the liver. Other: Right pleural effusion is visible (image 35). Negative visible right kidney. IMPRESSION: 1. Nodular liver contour and small volume ascites suspicious for Cirrhosis. No discrete liver lesion by ultrasound. 2. Gallstones with sludge and mild gallbladder wall thickening. However, no sonographic Murphy sign elicited. Favor these changes are reactive secondary to #1 rather than due to acute cholecystitis. 3. No evidence of bile duct obstruction. 4. Right pleural effusion. Electronically Signed   By: Genevie Ann M.D.   On: 05/29/2022 08:00   DG  CHEST PORT 1 VIEW  Result Date: 05/29/2022 CLINICAL DATA:  66 year old female respiratory distress, intubated. EXAM: PORTABLE CHEST 1 VIEW COMPARISON:  Portable chest 05/28/2022 and earlier. FINDINGS: Portable AP semi upright view at 0412 hours. Stable lines and tubes. Less rotated today. Confluent and rounded 4.5 cm right perihilar opacity, although right lung ventilation appears improved since 05/27/2022. Patchy and streaky multifocal left perihilar opacity is stable. No pneumothorax or pleural effusion. No areas of worsening ventilation. Stable cardiac size and mediastinal contours. Paucity of bowel gas in the upper abdomen. Prior cervical ACDF. No acute osseous abnormality identified. IMPRESSION: 1.  Stable lines and tubes. 2. Bilateral pneumonia suspected, with right lung ventilation mildly improved since 05/27/2022. 3. No new cardiopulmonary abnormality. Electronically Signed   By: Genevie Ann M.D.   On: 05/29/2022 05:07   ECHOCARDIOGRAM LIMITED BUBBLE STUDY  Result Date: 05/28/2022    ECHOCARDIOGRAM LIMITED REPORT   Patient Name:   Valerie Cline Date of Exam: 05/28/2022 Medical Rec #:  [id number]      Height:       66.0 in Accession #:    [id number]     Weight:       195.5 lb Date of Birth:  Apr 30, 1956      BSA:          1.981 m Patient Age:    33 years       BP:           98/55 mmHg Patient Gender: F              HR:           101 bpm. Exam Location:  Inpatient Procedure: Limited Echo, Color Doppler, Cardiac Doppler and Saline Contrast            Bubble Study Indications:    patent foramen ovale  History:  Patient has prior history of Echocardiogram examinations, most                 recent 05/24/2022. Sepsis. Meningitis. Pneumonia.; Risk                 Factors:Diabetes, Hypertension and Dyslipidemia.  Sonographer:    Johny Chess RDCS Referring Phys: [id number] [place]ee [place]urb[place]  Sonographer Comments: Image acquisition challenging due to respiratory motion. IMPRESSIONS  1. Left ventricular ejection  fraction, by estimation, is 70 to 75%. The left ventricle has hyperdynamic function. The left ventricle has no regional wall motion abnormalities. Left ventricular diastolic function could not be evaluated.  2. Right ventricular systolic function is normal. The right ventricular size is normal.  3. The mitral valve is normal in structure. No evidence of mitral valve regurgitation.  4. Aortic valve regurgitation is not visualized. No aortic stenosis is present.  5. The inferior vena cava is normal in size with greater than 50% respiratory variability, suggesting right atrial pressure of 3 mmHg.  6. Agitated saline contrast bubble study was negative, with no evidence of any interatrial shunt. Comparison(s): No significant change from prior study. Prior images reviewed side by side. FINDINGS  Left Ventricle: Left ventricular ejection fraction, by estimation, is 70 to 75%. The left ventricle has hyperdynamic function. The left ventricle has no regional wall motion abnormalities. The left ventricular internal cavity size was normal in size. There is no left ventricular hypertrophy. Left ventricular diastolic function could not be evaluated. Right Ventricle: The right ventricular size is normal. No increase in right ventricular wall thickness. Right ventricular systolic function is normal. Left Atrium: Left atrial size was normal in size. Right Atrium: Right atrial size was normal in size. Mitral Valve: The mitral valve is normal in structure. Mild mitral annular calcification. Tricuspid Valve: The tricuspid valve is grossly normal. Aortic Valve: Aortic valve regurgitation is not visualized. No aortic stenosis is present. Pulmonic Valve: The pulmonic valve was not well visualized. Venous: The inferior vena cava is normal in size with greater than 50% respiratory variability, suggesting right atrial pressure of 3 mmHg. IAS/Shunts: Agitated saline contrast was given intravenously to evaluate for intracardiac shunting.  Agitated saline contrast bubble study was negative, with no evidence of any interatrial shunt. IVC IVC diam: 1.80 cm Sanda Klein MD Electronically signed by Sanda Klein MD Signature Date/Time: 05/28/2022/1:02:45 PM    Final    DG CHEST PORT 1 VIEW  Result Date: 05/28/2022 CLINICAL DATA:  66 year old female with a history of respiratory failure EXAM: PORTABLE CHEST 1 VIEW COMPARISON:  05/27/2022 FINDINGS: Right rotation limits evaluation. Unchanged cardiomediastinal silhouette. Mixed interstitial and airspace disease again demonstrated bilateral lungs, with improved airspace opacity at the right lung base. Endotracheal tube remains, terminating approximately 5.2 cm above the carina. Unchanged left upper extremity PICC. Surgical changes of the cervical region. No pneumothorax or large pleural effusion. IMPRESSION: Redemonstration of bilateral mixed interstitial and airspace disease, somewhat improved at the right lung base Unchanged endotracheal tube terminating suitably above the carina. Unchanged left upper extremity PICC Electronically Signed   By: Corrie Mckusick D.O.   On: 05/28/2022 10:17   DG Chest Port 1 View  Result Date: 05/27/2022 CLINICAL DATA:  Ventilator dependent respiratory failure. EXAM: PORTABLE CHEST 1 VIEW COMPARISON:  05/25/2022. FINDINGS: 5:19 a.m. ETT tip is 3.1 cm from the carina. NGT enters the stomach with the intragastric course is not filmed. Left PICC terminates at the superior cavoatrial junction. The cardiac size is borderline. The  central vessels are normal caliber. There is worsening patchy consolidation in the left upper and right lower lung fields most likely due to multilobar pneumonia. Aspiration component possible. Scattered opacities in the left lower lung field are unaltered. Right upper lung field is essentially clear.  Thoracic spondylosis. IMPRESSION: Worsening bilateral opacities consistent with worsening bilateral pneumonia. Electronically Signed   By: Telford Nab M.D.   On: 05/27/2022 07:24   MR CERVICAL SPINE W WO CONTRAST  Result Date: 05/25/2022 CLINICAL DATA:  Myelopathy, acute, cervical spine EXAM: MRI CERVICAL SPINE WITHOUT AND WITH CONTRAST TECHNIQUE: Multiplanar and multiecho pulse sequences of the cervical spine, to include the craniocervical junction and cervicothoracic junction, were obtained without and with intravenous contrast. CONTRAST:  51m GADAVIST GADOBUTROL 1 MMOL/ML IV SOLN COMPARISON:  CT neck 09/03/2020. FINDINGS: Alignment: Straightening of the normal cervical lordosis. Vertebrae: Prior ACDF from C5-C7. No evidence of acute fracture no evidence of discitis. Cord: There is abnormal cord signal at C4-C5 which does not enhance. No enhancing spinal canal collection. Posterior Fossa, vertebral arteries, paraspinal tissues: Atherosclerosis of the right vertebral artery. Preserved flow voids. Enlarged multinodular goiter, similar to prior CT in November 2021. Disc levels: The craniocervical junction is unremarkable. C2-C3: No significant stenosis.  Mild left facet effusion. C3-C4: Mild uncovertebral joint hypertrophy. Mild right neural foraminal narrowing. No spinal canal stenosis. Bilateral facet effusions, right greater than left. Bilateral facet effusions right greater than left. C4-C5: Severe disc height loss with disc bulging, uncovertebral joint hypertrophy and bilateral facet arthropathy. There is moderate spinal canal stenosis with and indentation of the cord and mild associated abnormal cord signal. There is severe left and moderate right neural foraminal stenosis. Right facet effusion. C5-C6: Prior ACDF. No significant spinal stenosis. Mild uncovertebral joint hypertrophy. Moderate right neural foraminal stenosis. Mild left neural foraminal stenosis C6-C7: Prior ACDF. No significant spinal canal stenosis. Mild uncovertebral joint hypertrophy. Mild to moderate right neural foraminal stenosis. No left neural foraminal stenosis. C7-T1:  Right paracentral/foraminal disc extrusion with endplate spurring results in moderate right lateral recess stenosis and mild neural foraminal stenosis. Upper chest: There is multifocal airspace disease in the lung apices, as seen on recent chest radiograph. IMPRESSION: Prior ACDF from C5-C7. Residual moderate right neural foraminal stenosis at C5-6 and mild-to-moderate neural foraminal stenosis on the right at C6-C7. Patent spinal canal at these levels. Severe degenerative disc disease at C4-C5 with endplate spurring and facet arthropathy results in moderate spinal canal stenosis and mild abnormal cord signal suggesting congestive myelopathy. Severe left and moderate right neural foraminal stenosis at this level. Right paracentral/foraminal disc extrusion at C7-T1 with endplate spurring results in moderate right lateral recess and mild neural foraminal stenosis. Multifocal airspace disease in the lung apices concerning for pneumonia. Electronically Signed   By: JMaurine SimmeringM.D.   On: 05/25/2022 17:42   MR BRAIN W WO CONTRAST  Result Date: 05/25/2022 CLINICAL DATA:  Altered mental status EXAM: MRI HEAD WITHOUT AND WITH CONTRAST TECHNIQUE: Multiplanar, multiecho pulse sequences of the brain and surrounding structures were obtained without and with intravenous contrast. CONTRAST:  849mGADAVIST GADOBUTROL 1 MMOL/ML IV SOLN COMPARISON:  05/20/2022 CT head, no prior MRI head FINDINGS: Brain: No restricted diffusion to suggest acute or subacute infarct. No acute hemorrhage, mass, mass effect, or midline shift. FLAIR hyperintense signal along the cortex, primarily posteriorly (series 6, image 26 for example) suggestive of extra-axial material. No abnormal parenchymal or leptomeningeal enhancement. Along the left posterior frontal/anterior parietal convexity, there is a dural-based  partially calcified enhancing mass which measures up to 9 x 19 x 6 mm (AP x TR x CC) (series 20, image 46 and series 21, image 14), most  likely a meningioma. No definite underlying T2 hyperintense signal. Advanced cerebral volume loss for age. T2 hyperintense signal in the periventricular white matter, likely the sequela of chronic small vessel ischemic disease. Vascular: Normal arterial flow voids. Normal arterial and venous enhancement. Skull and upper cervical spine: Normal marrow signal. Sinuses/Orbits: Mucosal thickening in the ethmoid air cells and maxillary sinuses. The orbits are unremarkable. Other: Fluid in the mastoid air cells. IMPRESSION: 1. Abnormal CSF signal, primarily posteriorly, which is nonspecific and may represent infection/meningitis and/or hemorrhage. 2. Enhancing extra-axial lesion along the left frontoparietal convexity, most likely a meningioma, without evidence of underlying mass effect. 3. No acute or subacute infarct. These results will be called to the ordering clinician or representative by the Radiologist Assistant, and communication documented in the PACS or [employer]. Electronically Signed   By: Merilyn Baba M.D.   On: 05/25/2022 17:28   DG Chest Port 1 View  Addendum Date: 05/25/2022   ADDENDUM REPORT: 05/25/2022 08:31 ADDENDUM: Left upper extremity PICC with tip projecting in the region of the lower SVC. The PICC no longer curls medially, suggesting it may no longer be within the azygous. Recommend continued imaging follow-up. Electronically Signed   By: Margaretha Sheffield M.D.   On: 05/25/2022 08:31   Result Date: 05/25/2022 CLINICAL DATA:  ET tube present. EXAM: PORTABLE CHEST 1 VIEW COMPARISON:  None Available. FINDINGS: ET tube approximately 5 cm above the carina. Improved aeration of the lungs with improved but persistent left greater than right perihilar opacities. No visible pleural effusions or pneumothorax. Cardiomediastinal silhouette is unchanged. Gastric tube courses below the diaphragm with the tip outside the field of view. Partially imaged lumbar spinal fusion. IMPRESSION: 1. ET tube  approximately 5 cm above the carina. 2. Improved aeration of the lungs with improved but persistent left greater than right perihilar opacities Electronically Signed: By: Margaretha Sheffield M.D. On: 05/25/2022 08:19   Overnight EEG with video  Result Date: 05/25/2022 Lora Havens, MD     05/25/2022  9:50 AM Patient Name: Valerie Cline MRN: [id number] Epilepsy Attending: Lora Havens Referring Physician/Provider: Greta Doom, MD  Duration: 05/24/2022 2041 to 05/25/2022 9147 Patient history: 66yo F with ams. EEG to evaluate for seizure Level of alertness: lethargic, asleep AEDs during EEG study: propofol Technical aspects: This EEG study was done with scalp electrodes positioned according to the 10-20 International system of electrode placement. Electrical activity was acquired at a sampling rate of '500Hz'$  and reviewed with a high frequency filter of '70Hz'$  and a low frequency filter of '1Hz'$ . EEG data were recorded continuously and digitally stored. Description: During awake state, no clear posterior dominant rhythm was seen. Sleep was characterized by sleep spindles (12 to 14 Hz), maximal frontocentral region. EEG showed near continuous generalized polymorphic 3 to 6 Hz theta-delta slowing admixed with 1 to 2 seconds of generalized EEG attenuation. Event button was pressed on 05/24/2022 at 2147 and on 05/26/2022 at 0601 for unclear reasons. Concomitant EEG before, during and after the event did not show any EEG changes suggest seizure. Hyperventilation and photic stimulation were not performed.   ABNORMALITY - Continuous slow, generalized IMPRESSION: This study is suggestive of severe diffuse encephalopathy, nonspecific etiology but could be due to sedation.  No seizures or epileptiform discharges were seen throughout the recording. Two events  were recorded as described above without concomitant EEG change. These episodes are most likely not epileptic. Priyanka [name]   DG CHEST PORT 1 VIEW  Result  Date: 05/24/2022 CLINICAL DATA:  Status post PICC placement EXAM: PORTABLE CHEST 1 VIEW COMPARISON:  05/24/2022 at 0049 hours FINDINGS: Left arm PICC terminates in the mid SVC with this tip pointed towards the azygous vein. Endotracheal tube terminates 5 cm above the carina. Enteric tube courses into the stomach. Multifocal pneumonia. Masslike opacity in the left upper lobe. No pleural effusion or pneumothorax. The heart is normal in size. Cervical spine fixation hardware, incompletely visualized. IMPRESSION: Left arm PICC terminates in the mid SVC with this tip pointed towards the azygous vein. Otherwise unchanged. Electronically Signed   By: Julian Hy M.D.   On: 05/24/2022 23:50   ECHOCARDIOGRAM COMPLETE  Result Date: 05/24/2022    ECHOCARDIOGRAM REPORT   Patient Name:   Valerie Cline Date of Exam: 05/24/2022 Medical Rec #:  [id number]      Height:       66.0 in Accession #:    [id number]     Weight:       177.2 lb Date of Birth:  December 04, 1955      BSA:          1.900 m Patient Age:    30 years       BP:           106/56 mmHg Patient Gender: F              HR:           79 bpm. Exam Location:  Inpatient Procedure: 2D Echo, Cardiac Doppler and Color Doppler Indications:    Abnormal EKG  History:        Patient has prior history of Echocardiogram examinations. Risk                 Factors:Hypertension and Diabetes.  Sonographer:    Jyl Heinz Referring Phys: [id number] STEPHANIE M REESE  Sonographer Comments: Echo performed with patient supine and on artificial respirator and suboptimal parasternal window. IMPRESSIONS  1. Left ventricular ejection fraction, by estimation, is 60 to 65%. The left ventricle has normal function. The left ventricle has no regional wall motion abnormalities. Left ventricular diastolic parameters are consistent with Grade I diastolic dysfunction (impaired relaxation).  2. Right ventricular systolic function is normal. The right ventricular size is normal. Tricuspid regurgitation  signal is inadequate for assessing PA pressure.  3. The mitral valve is grossly normal. No evidence of mitral valve regurgitation.  4. No significant AI. The aortic valve was not well visualized. There is mild calcification of the aortic valve.  5. The inferior vena cava is dilated in size with >50% respiratory variability, suggesting right atrial pressure of 8 mmHg. Comparison(s): No significant change from prior study. FINDINGS  Left Ventricle: Left ventricular ejection fraction, by estimation, is 60 to 65%. The left ventricle has normal function. The left ventricle has no regional wall motion abnormalities. The left ventricular internal cavity size was normal in size. There is  no left ventricular hypertrophy. Left ventricular diastolic parameters are consistent with Grade I diastolic dysfunction (impaired relaxation). Right Ventricle: The right ventricular size is normal. No increase in right ventricular wall thickness. Right ventricular systolic function is normal. Tricuspid regurgitation signal is inadequate for assessing PA pressure. Left Atrium: Left atrial size was normal in size. Right Atrium: Right atrial size was normal in size. Pericardium: There is  no evidence of pericardial effusion. Mitral Valve: The mitral valve is grossly normal. No evidence of mitral valve regurgitation. Tricuspid Valve: The tricuspid valve is normal in structure. Tricuspid valve regurgitation is not demonstrated. Aortic Valve: No significant AI. The aortic valve was not well visualized. There is mild calcification of the aortic valve. Aortic valve peak gradient measures 14.3 mmHg. Pulmonic Valve: The pulmonic valve was not well visualized. Pulmonic valve regurgitation is not visualized. Aorta: The aortic root and ascending aorta are structurally normal, with no evidence of dilitation. Venous: The inferior vena cava is dilated in size with greater than 50% respiratory variability, suggesting right atrial pressure of 8 mmHg.  IAS/Shunts: No atrial level shunt detected by color flow Doppler.  LEFT VENTRICLE PLAX 2D LVIDd:         5.20 cm     Diastology LVIDs:         4.20 cm     LV e' medial:    8.16 cm/s LV PW:         0.90 cm     LV E/e' medial:  14.0 LV IVS:        0.80 cm     LV e' lateral:   7.40 cm/s LVOT diam:     1.80 cm     LV E/e' lateral: 15.4 LV SV:         68 LV SV Index:   36 LVOT Area:     2.54 cm  LV Volumes (MOD) LV vol d, MOD A2C: 77.6 ml LV vol d, MOD A4C: 84.3 ml LV vol s, MOD A2C: 30.5 ml LV vol s, MOD A4C: 34.7 ml LV SV MOD A2C:     47.1 ml LV SV MOD A4C:     84.3 ml LV SV MOD BP:      47.7 ml RIGHT VENTRICLE             IVC RV Basal diam:  3.10 cm     IVC diam: 2.40 cm RV Mid diam:    2.20 cm RV S prime:     12.70 cm/s TAPSE (M-mode): 2.6 cm LEFT ATRIUM             Index        RIGHT ATRIUM           Index LA diam:        3.70 cm 1.95 cm/m   RA Area:     12.80 cm LA Vol (A2C):   40.1 ml 21.11 ml/m  RA Volume:   29.20 ml  15.37 ml/m LA Vol (A4C):   54.0 ml 28.42 ml/m LA Biplane Vol: 51.4 ml 27.06 ml/m  AORTIC VALVE AV Area (Vmax): 1.79 cm AV Vmax:        189.00 cm/s AV Peak Grad:   14.3 mmHg LVOT Vmax:      133.00 cm/s LVOT Vmean:     104.000 cm/s LVOT VTI:       0.266 m  AORTA Ao Root diam: 2.60 cm Ao Asc diam:  3.00 cm MITRAL VALVE MV Area (PHT): 4.17 cm     SHUNTS MV Decel Time: 182 msec     Systemic VTI:  0.27 m MV E velocity: 114.00 cm/s  Systemic Diam: 1.80 cm MV A velocity: 132.00 cm/s MV E/A ratio:  0.86 Phineas Inches Electronically signed by Phineas Inches Signature Date/Time: 05/24/2022/12:37:03 PM    Final    EEG adult  Result Date: 05/24/2022 Lora Havens, MD  05/24/2022 12:32 PM Patient Name: Valerie Cline MRN: [id number] Epilepsy Attending: Lora Havens Referring Physician/Provider: Candee Furbish, MD Date: 05/24/2022 Duration: 21.25 mins Patient history: 66yo F with ams. EEG to evaluate for seizure Level of alertness: Awake AEDs during EEG study: Propofol Technical aspects: This EEG  study was done with scalp electrodes positioned according to the 10-20 International system of electrode placement. Electrical activity was acquired at a sampling rate of '500Hz'$  and reviewed with a high frequency filter of '70Hz'$  and a low frequency filter of '1Hz'$ . EEG data were recorded continuously and digitally stored. Description: EEG showed continuous generalized 3 to 6 Hz theta-delta slowing. Hyperventilation and photic stimulation were not performed.   Of note, eeg was technically difficult due to significant myogenic artifact. ABNORMALITY - Continuous slow, generalized IMPRESSION: This technically difficult study is suggestive of severe diffuse encephalopathy, nonspecific etiology. No seizures or epileptiform discharges were seen throughout the recording. Priyanka O Yadav   [place] EKG SITE RITE  Result Date: 05/24/2022 If [facility] image not attached, placement could not be confirmed due to current cardiac rhythm.  US THYROID  Result Date: 05/24/2022 CLINICAL DATA:  Thyroid nodule, previous left thyroid biopsy EXAM: THYROID ULTRASOUND TECHNIQUE: Ultrasound examination of the thyroid gland and adjacent soft tissues was performed. COMPARISON:  09/29/2021 FINDINGS: Parenchymal Echotexture: Mildly heterogenous Isthmus: 4 mm Right lobe: 2.8 x 0.9 x 1.6 cm Left lobe: 5.4 x 2.7 x 3.1 cm _________________________________________________________ Estimated total number of nodules >/= 1 cm: 1 Number of spongiform nodules >/=  2 cm not described below (TR1): 0 Number of mixed cystic and solid nodules >/= 1.5 cm not described below (TR2): 0 _________________________________________________________ The previously biopsied left mid thyroid TR 3 nodule is difficult to image today because of suboptimal positioning due to the patient's size and being intubated. No right thyroid abnormality. No regional adenopathy. No hypervascularity. IMPRESSION: Limited thyroid ultrasound. Previously biopsied left mid thyroid nodule is not  as well demonstrated today as detailed above. No gross interval change. No definite new thyroid abnormality. The above is in keeping with the ACR TI-RADS recommendations - J Am Coll Radiol 2017;14:587-595. Electronically Signed   By: Jerilynn Mages.  Shick M.D.   On: 05/24/2022 09:02   DG CHEST PORT 1 VIEW  Result Date: 05/24/2022 CLINICAL DATA:  OG tube placement EXAM: PORTABLE CHEST 1 VIEW COMPARISON:  05/24/2022 at 0020 hours FINDINGS: Endotracheal tumor terminates 3 cm above the carina. Enteric tube courses into the stomach. Otherwise unchanged. Multifocal patchy opacities, suggesting pneumonia, although masslike central left upper lobe opacity warrants attention on follow-up. No pleural effusion or pneumothorax. The heart is normal in size. IMPRESSION: Endotracheal tumor terminates 3 cm above the carina. Enteric tube courses into the stomach. Otherwise unchanged. Electronically Signed   By: Julian Hy M.D.   On: 05/24/2022 01:59   DG CHEST PORT 1 VIEW  Result Date: 05/24/2022 CLINICAL DATA:  Pneumonia EXAM: PORTABLE CHEST 1 VIEW COMPARISON:  CT chest dated 09/03/2020 FINDINGS: Patchy/masslike opacity in the central left upper lobe. Additional mild patchy opacities in the right upper lobe and bilateral lower lobes, suggesting multifocal pneumonia. No pleural effusion or pneumothorax. The heart is normal in size. Cervical spine fixation hardware. IMPRESSION: Suspected multifocal pneumonia. Masslike opacity in the central left upper lobe, presumably related to the suspected infectious process, but warranting attention on follow-up. Follow-up CT chest is suggested in 4-6 weeks to document clearance. If the patient does not have infectious symptoms, CT chest with contrast suggested at this  time. Electronically Signed   By: Julian Hy M.D.   On: 05/24/2022 00:45   CT Head Wo Contrast  Result Date: 05/20/2022 CLINICAL DATA:  Dizziness. EXAM: CT HEAD WITHOUT CONTRAST TECHNIQUE: Contiguous axial images were  obtained from the base of the skull through the vertex without intravenous contrast. RADIATION DOSE REDUCTION: This exam was performed according to the departmental dose-optimization program which includes automated exposure control, adjustment of the mA and/or kV according to patient size and/or use of iterative reconstruction technique. COMPARISON:  None Available. FINDINGS: Brain: Mild chronic ischemic white matter disease is noted. No mass effect or midline shift is noted. Ventricular size is within normal limits. There is no evidence of mass lesion, hemorrhage or acute infarction. Vascular: No hyperdense vessel or unexpected calcification. Skull: Normal. Negative for fracture or focal lesion. Sinuses/Orbits: No acute finding. Other: None. IMPRESSION: No acute intracranial abnormality seen. Electronically Signed   By: Marijo Conception M.D.   On: 05/20/2022 09:40    Labs:  CBC: Recent Labs    06/05/22 0402 06/06/22 0022 06/06/22 0431 06/07/22 0350  WBC 30.3* 20.8* 18.8* 16.3*  HGB 8.6* 8.4* 8.4* 8.1*  HCT 27.0* 27.0* 27.0* 25.9*  PLT 189 188 168 161    COAGS: Recent Labs    12/08/21 0739 05/24/22 0303 05/29/22 1002  INR 1.0 1.2 2.1*  APTT  --   --  40*    BMP: Recent Labs    06/05/22 0402 06/05/22 1606 06/06/22 0431 06/07/22 0350  NA 136 136 135 136  K 4.8 4.3 3.4* 5.4*  CL 103 103 102 102  CO2 '25 25 26 '$ 21*  GLUCOSE 180* 159* 149* 251*  BUN 50* 47* 43* 104*  CALCIUM 7.7* 7.8* 7.8* 8.1*  CREATININE 1.01* 0.95 0.92 2.00*  GFRNONAA >60 >60 >60 27*    LIVER FUNCTION TESTS: Recent Labs    05/29/22 0546 05/29/22 1801 05/30/22 0455 05/30/22 1600 05/31/22 0740 05/31/22 1456 06/02/22 0500 06/02/22 1530 06/05/22 0402 06/05/22 1606 06/06/22 0431 06/07/22 0350  BILITOT 0.8  --  1.5*  --  1.4*  --  1.3*  --   --   --   --   --   AST 1,934*  --  9,211*  --  2,519*  --  201*  --   --   --   --   --   ALT 1,346*  --  4,999*  --  3,408*  --  1,482*  --   --   --   --    --   ALKPHOS 154*  --  153*  --  146*  --  164*  --   --   --   --   --   PROT 4.1*  --  5.3*  --  4.7*  --  6.0*  --   --   --   --   --   ALBUMIN <1.5*   < > 3.0*  3.0*   < > 2.5*   < > 2.4*  2.4*   < > 2.2* 2.3* 2.2* 2.2*   < > = values in this interval not displayed.    Assessment and Plan:  Respiratory failure, trach and vent dependent --unresponsive after weaning of versed --currently with NG tube, request is for G-tube placement, imaging reviewed and procedure approved by Dr. Dwaine Gale --Patient currently with elevated WBC, but down trending.  Will need to evaluate in the morning before proceeding --Has heparin gtt that will need to be stopped  prior to placement --NG tube feeds to be stopped at midnight --Abx ordered --thorough discussions had with spouse and sister.  Risks and benefits image guided gastrostomy tube placement was discussed with the patient including, but not limited to the need for a barium enema during the procedure, bleeding, infection, peritonitis and/or damage to adjacent structures.  All of the patient's questions were answered, patient is agreeable to proceed.  Consent signed and in chart.   Thank you for this interesting consult.  I greatly enjoyed meeting SANTOSHA JIVIDEN and look forward to participating in their care.  A copy of this report was sent to the requesting provider on this date.  Electronically Signed: Pasty Spillers, PA 06/07/2022, 2:21 PM   I spent a total of 40 Minutes in face to face in clinical consultation, greater than 50% of which was counseling/coordinating care for g-tube placement

## 2022-06-07 NOTE — Progress Notes (Signed)
ID Brief Note   MRI brain 8/7  Persistent findings of meningitis. Extent of abnormal sulcal signal has progressed. Mildly increase in caliber of ventricles may reflect communicating hydrocephalus.   Patchy foci of abnormal cortical/subcortical diffusion may reflect acute ischemia, seizure related changes, and/or encephalitis.  Continues to be unresponsive off sedation now for over 72 hrs. Concerns for significant neurologic injury per Neurology. Agree with PCCM and Neurology regarding 1 more week of wait time to look for any improvement in mental status and re-visit [place] at that time  Discussed with IP Dr Baxter Flattery, continue droplet precautions in the setting of entero viral meningitis and encephalitis for now   Rosiland Oz, MD Infectious Disease Physician [facility] [address] [place], [place] [address] Phone: [phone]  Fax: [phone]

## 2022-06-07 NOTE — Progress Notes (Signed)
Case discussed with ID/Neuro. In light of progression on MRI and profound ongoing comatose state, multiorgan failure recommend 1 more week of aggressive care.  If further deterioration or stagnation we will discuss EOL with family.  Family seems agreeable, will re-address tomorrow.  Erskine Emery MD PCCM

## 2022-06-07 NOTE — Progress Notes (Signed)
ANTICOAGULATION CONSULT NOTE  Pharmacy Consult for heparin Indication: pulmonary embolus  Allergies  Allergen Reactions   Canagliflozin Other (See Comments)    recurrent vaginitis   Glucophage [Metformin] Other (See Comments)    GI intolerance   Niaspan [Niacin] Other (See Comments)    flushing    Patient Measurements: Height: 5' 5.98" (167.6 cm) Weight: 87.4 kg (192 lb 10.9 oz) IBW/kg (Calculated) : 59.25 Heparin Dosing Weight: 79kg  Vital Signs: Temp: 97.2 F (36.2 C) (08/08 1622) Temp Source: Core (08/08 1622) BP: 114/52 (08/08 1815) Pulse Rate: 91 (08/08 1815)  Labs: Recent Labs    06/05/22 1855 06/06/22 0022 06/06/22 0431 06/07/22 0350 06/07/22 1700 06/07/22 1704  HGB  --  8.4* 8.4* 8.1*  --   --   HCT  --  27.0* 27.0* 25.9*  --   --   PLT  --  188 168 161  --   --   HEPARINUNFRC 0.67  --  <0.10*  --  0.52  --   CREATININE  --   --  0.92 2.00*  --  1.84*     Estimated Creatinine Clearance: 33 mL/min (A) (by C-G formula based on SCr of 1.84 mg/dL (H)).   Assessment: 28 yoF admitted with meningitis from OSH. Pt with ongoing fevers and worsening clinical status 7/29, RV severely down on beside ECHO so concerns for acute PE. Pt received alteplase on 7/30 for suspected saddle PE.    Heparin was on hold given bleeding at trach on 8/7. Trach bleeding improved today - okay per MD to restart and would like with bolus.  Hgb 8.1, plt 161.   Heparin level 0.52 slightly supratherapeutic. No s/sx of bleeding or issues with infusion per RN.    Goal of Therapy:  Heparin level 0.3-0.5 units/mL Monitor platelets by anticoagulation protocol: Yes   Plan:  Decrease heparin slightly to 1050 units/hr Obtain heparin level in 8 hours  Daily heparin level and CBC while on heparin Monitor for s/sx bleeding   Eliseo Gum, PharmD PGY1 Pharmacy Resident   06/07/2022  6:51 PM

## 2022-06-07 NOTE — Progress Notes (Signed)
PT Cancellation Note  Patient Details Name: Valerie Cline MRN: [id number] DOB: [birth year]   Cancelled Treatment:    Reason Eval/Treat Not Completed: Patient not medically ready (pt remains unresponsive and will sign off and await new order as pt able to participate)   Sandy Salaam Imogean Ciampa 06/07/2022, 8:13 AM [place] Office: [phone]

## 2022-06-07 NOTE — Progress Notes (Signed)
Unfortunately, things still not all that great.  She is really not not responsive.  She has a continuous EEG monitor.  She is still on low-dose pressors.  She had the dialysis temporarily stopped.  It sounds like this is going to go on again today is her BUN is 104 creatinine 2.  Blood sugar is 251.  Potassium 5.4.  Her white cell count is 16.3.  Hemoglobin 8.1.  Platelet count 161,000.  I know that a lot of people have been trying to work on her and to try to improve her status.  It just does not appear to be moving in the right direction.  She had an MRI of the brain yesterday.  This.  To be worse with respect to changes consistent with meningitis.  It is still felt that she has enteroviral meningitis.  I hate that she is just not getting better.  It is unclear as to how long she will continue on her current care.  I know she IVIG last week.  I am not sure how much this will really be able to help.  I know that she is trying her best.  I know her blood sugars have not been all that good.  I know the family is hoping that she will get better.  I would think that even if she did, she may and likely would not have much quality of life.  We will continue to follow along and try to help out any way that we can.  She is getting incredible care from all the staff down in the CCU.  Lattie Haw, MD  Oswaldo Milian 40:5

## 2022-06-07 NOTE — Progress Notes (Signed)
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   Discharge summary reviewed: She initially presented to OSH on 05/20/22 for left upper extremity tremors. She reported recent hand foot any mouth disease contracted from her grandchild that left her weak and tired prior to this admission. Neurology work-up including MRI, EEG negative. MRI/MRA neg for acute infarct, 13 mm focus in left frontal lobe representing artifact vs small calcified meningioma, chronic small vessel ischemic changes, cerebral atrophy, high-grade stenosis in bilateral ICA, focal stenosis in left ACA and intracranial right vertebral artery. MRI of C-spine showing C4-5 cord compression from advanced disc degeneration with posterior endplate ridging.  Showing left foraminal impingement at the same level.  Also showing C5-6 and C6-7 ADCF with solid arthrodesis.  Neurologist at [facility] had recommended neurosurgical evaluation.  She developed fevers and transferred to ICU concerning for meningitis and started on Vanc, rocephin, ampicillin and acyclovir. LP was performed on 7/24 with WBC 55 RBC 2. Pending herpes, enterovirus, ehrlichia. CT A/P showed bilateral ground glass and lung opacities concerning for multifocal pneumonia.   PCCM consulted for decreased responsiveness and hypoxemia on NRB. On exam obtunded and only able to say name. O2 sats in the 70s on NRB. Transferred to the ICU for emergent intubation   Pertinent  Medical History  As above  Significant Hospital Events: Including  procedures, antibiotic start and stop dates in addition to other pertinent events   7/24 Admitted to [facility] from [place]. Transferred to PCCM when found in respiratory failure 7/25 intubated, bronched, art line placed, PICC placed.  Neuro and ID consulted. Started decadron  7/26 decadron dc/d 7/27 felt most consistent with viral meningoencephalitis  7/29-7/30 sudden onset multiorgan failure; given tpa with improvement 8/4 trach 8/6 trach bleeding  Interim History / Subjective:  No events. Trach oozing resolved. Minimal UOP.  Objective   Blood pressure (!) 138/55, pulse 97, temperature 98.7 F (37.1 C), temperature source Oral, resp. rate (!) 28, height 5' 5.98" (1.676 m), weight 87.4 kg, SpO2 97 %. CVP:  [7 mmHg-14 mmHg] 8 mmHg  Vent Mode: PSV;CPAP FiO2 (%):  [40 %] 40 % Set Rate:  [28 bmp] 28 bmp Vt Set:  [480 mL] 480 mL PEEP:  [8 cmH20] 8 cmH20 Pressure Support:  [12 cmH20-14 cmH20] 14 cmH20 Plateau Pressure:  [18 cmH20-22 cmH20] 19 cmH20   Intake/Output Summary (Last 24 hours) at 06/07/2022 0819 Last data filed at 06/07/2022 0600 Gross per 24 hour  Intake 1951.42 ml  Output 670 ml  Net 1281.42 ml    Filed Weights   06/01/22 0439 06/02/22 0500 06/03/22 0340  Weight: 91 kg 88.9 kg 87.4 kg   Physical Exam: Comatose on vent Lungs with rhonci bilaterally Trach looks CDI, sutures are out Trace global anasarca Not responsive to pain Triggers vent Pupils equal, not tracking  K rising CBC stable  Resolved Hospital Problem list   Metabolic acidosis Hypertriglycerides being off propofol infusion Severe metabolic acidosis UTI  Assessment & Plan:  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  Preceding hand/foot mouth.  CSF enterovirus +  Critical cervical myopathy s/p ACDF C5-C7- makes repeat LP dangerous. She remains severely encephalopathic, not waking up; recovery could take weeks to months if it occurs.  Repeat MRI showing some progression  which is concerning. - Continue to hold sedation - Supportive care - Will need LTACH vs. SNF - Unclear duration for droplet - On steroid taper, ?reason: refractory SE - Appreciate ID and neuro input  Dysphagia- PEG consult  Acute hypoxemic/hypercapnic respiratory failure 2/2 multilobar PNA s/p tracheostomy Leukamoid reaction vs. HCAP- improving - PS trial as tolerated - Meropenem to complete 7 days  Tracheostomy site bleeding- resolved - Restart heparin  Small lymphocytic B cell non-hodgkin lymphoma Hypogammaglobinemia- Patient is on Rituxan as an outpatient; last dose in June - f/u repeat IgG level, may give full 5 day round  Status epilepticus- resolved but requiring multiple AEDs -Continue Keppra, Vimpat and phenobarbital - AEDs per neuro  DM2 with severe hyperglycemia  - Increase basal today  Sudden onset shock, acute renal failure, acute liver failure, RV dilation- s/p tpa with good improvement in hemodynamics - Increase midodrine - Wean levophed for MAP 65 - Heparin drip to resume today - CRRT per nephrology, eventual trial off  Best Practice (right click and "Reselect all SmartList Selections" daily)   Diet/type: Tube feeds DVT prophylaxis: heparin gtt GI prophylaxis: PPI Lines: PICC  Foley: ?Remove Foley Code Status:  full code Last date of multidisciplinary goals of care discussion 8/8  Family updated at bedside, continue aggressive care  Total critical care time: 33 minutes  Erskine Emery MD PCCM

## 2022-06-07 NOTE — Procedures (Signed)
Patient Name: Valerie Cline  MRN: [id number]  Epilepsy Attending: Lora Havens  Referring Physician/Provider: Greta Doom, MD   Duration: 06/06/2022 1014 to 06/07/2022 1014   Patient history: 66yo F with ams. EEG to evaluate for seizure   Level of alertness: comatose   AEDs during EEG study: Keppra, Vimpat, perampanel, Phenobarb   Technical aspects: This EEG study was done with scalp electrodes positioned according to the 10-20 International system of electrode placement. Electrical activity was acquired at a sampling rate of '500Hz'$  and reviewed with a high frequency filter of '70Hz'$  and a low frequency filter of '1Hz'$ . EEG data were recorded continuously and digitally stored.    Description: EEG showed near continuous polymorphic sharply contoured 3 to 7 Hz theta-delta slowing admixed with intermittent 1 to 3 seconds of generalized EEG suppression. Polyspikes were also noted in left posterior quadrant which at times appeared quasiperiodic  at 1 Hz and rhythmic lasting 3-6 seconds consistent with brief-ictal-interictal rhythmic discharges.    ABNORMALITY -Brief-ictal-interictal rhythmic discharges, left posterior quadrant -Polyspikes, left posterior quadrant -Continuous slow, generalized   IMPRESSION: This study showed evidence of epileptogenicity in left posterior quadrant with high risk for seizure recurrence.  Additionally there was severe diffuse encephalopathy. No definite seizures were seen during this study.     Valerie Cline

## 2022-06-07 NOTE — Progress Notes (Signed)
ANTICOAGULATION CONSULT NOTE  Pharmacy Consult for heparin Indication: pulmonary embolus  Allergies  Allergen Reactions   Canagliflozin Other (See Comments)    recurrent vaginitis   Glucophage [Metformin] Other (See Comments)    GI intolerance   Niaspan [Niacin] Other (See Comments)    flushing    Patient Measurements: Height: 5' 5.98" (167.6 cm) Weight: 87.4 kg (192 lb 10.9 oz) IBW/kg (Calculated) : 59.25 Heparin Dosing Weight: 79kg  Vital Signs: Temp: 98.7 F (37.1 C) (08/08 0756) Temp Source: Oral (08/08 0756) BP: 138/55 (08/08 0600) Pulse Rate: 97 (08/08 0600)  Labs: Recent Labs    06/05/22 0402 06/05/22 1606 06/05/22 1855 06/06/22 0022 06/06/22 0431 06/07/22 0350  HGB 8.6*  --   --  8.4* 8.4* 8.1*  HCT 27.0*  --   --  27.0* 27.0* 25.9*  PLT 189  --   --  188 168 161  HEPARINUNFRC 0.65  --  0.67  --  <0.10*  --   CREATININE 1.01* 0.95  --   --  0.92 2.00*     Estimated Creatinine Clearance: 30.4 mL/min (A) (by C-G formula based on SCr of 2 mg/dL (H)).   Assessment: 89 yoF admitted with meningitis from OSH. Pt with ongoing fevers and worsening clinical status 7/29, RV severely down on beside ECHO so concerns for acute PE. Pt received alteplase on 7/30 for suspected saddle PE.    Heparin was on hold given bleeding at trach on 8/7. Trach bleeding improved today - okay per MD to restart and would like with bolus.  Hgb 8.1, plt 161. No s/sx of bleeding.   Goal of Therapy:  Heparin level 0.3-0.5 units/mL Monitor platelets by anticoagulation protocol: Yes   Plan:  Order heparin bolus of 2000 units Restart heparin to 1100 units/hr Order heparin level in 8 hours  Monitor daily heparin level/CBC, s/sx bleeding  Antonietta Jewel, PharmD, [place] Clinical Pharmacist  Phone: [phone] 06/07/2022 8:22 AM  Please check AMION for all [place] phone numbers After 10:00 PM, call [place] [phone]

## 2022-06-07 NOTE — Progress Notes (Signed)
Patient ID: Valerie Cline, female   DOB: [birth year], 66 y.o.   MRN: [id number] S: Pt remains unresponsive and minimal UOP overnight. O:BP (!) 110/47   Pulse (!) 111   Temp 98.7 F (37.1 C) (Oral)   Resp (!) 32   Ht 5' 5.98" (1.676 m)   Wt 87.4 kg   SpO2 97%   BMI 31.12 kg/m   Intake/Output Summary (Last 24 hours) at 06/07/2022 0927 Last data filed at 06/07/2022 0900 Gross per 24 hour  Intake 2494.76 ml  Output 958 ml  Net 1536.76 ml   Intake/Output: I/O last 3 completed shifts: In: 3177.2 [I.V.:407.7; NG/GT:1863; IV Piggyback:906.5] Out: 7829 [Urine:140; Stool:1000]  Intake/Output this shift:  Total I/O In: 543.3 [I.V.:93.3; Other:30; NG/GT:420] Out: 288 [Urine:38; Other:250] Weight change:  FAO:ZHYQMVHQIONG, critically ill-appearing EXB:MWUXL Resp: occ rhonchi Abd:+BS, soft, NT/nd Ext: 1+ edema BLE  Recent Labs  Lab 06/02/22 0500 06/02/22 1530 06/03/22 1600 06/04/22 0208 06/04/22 1600 06/05/22 0402 06/05/22 1606 06/06/22 0431 06/07/22 0350  NA 135   < > 136 137 137 136 136 135 136  K 4.5   < > 5.4* 5.4* 5.1 4.8 4.3 3.4* 5.4*  CL 102   < > 103 105 104 103 103 102 102  CO2 23   < > '25 26 25 25 25 26 '$ 21*  GLUCOSE 186*   < > 240* 156* 133* 180* 159* 149* 251*  BUN 40*   < > 40* 45* 55* 50* 47* 43* 104*  CREATININE 1.15*   < > 1.05* 1.10* 1.23* 1.01* 0.95 0.92 2.00*  ALBUMIN 2.4*  2.4*   < > 2.4* 2.4* 2.2* 2.2* 2.3* 2.2* 2.2*  CALCIUM 7.6*   < > 8.0* 8.0* 7.8* 7.7* 7.8* 7.8* 8.1*  PHOS 3.7   < > 4.1 3.7 5.3* 4.3 4.4 3.6 10.2*  AST 201*  --   --   --   --   --   --   --   --   ALT 1,482*  --   --   --   --   --   --   --   --    < > = values in this interval not displayed.   Liver Function Tests: Recent Labs  Lab 06/02/22 0500 06/02/22 1530 06/05/22 1606 06/06/22 0431 06/07/22 0350  AST 201*  --   --   --   --   ALT 1,482*  --   --   --   --   ALKPHOS 164*  --   --   --   --   BILITOT 1.3*  --   --   --   --   PROT 6.0*  --   --   --   --   ALBUMIN 2.4*   2.4*   < > 2.3* 2.2* 2.2*   < > = values in this interval not displayed.   No results for input(s): "LIPASE", "AMYLASE" in the last 168 hours. No results for input(s): "AMMONIA" in the last 168 hours. CBC: Recent Labs  Lab 06/04/22 0208 06/05/22 0402 06/06/22 0022 06/06/22 0431 06/07/22 0350  WBC 36.3* 30.3* 20.8* 18.8* 16.3*  NEUTROABS 32.6* 27.6* 18.5* 16.9*  --   HGB 9.2* 8.6* 8.4* 8.4* 8.1*  HCT 29.0* 27.0* 27.0* 27.0* 25.9*  MCV 79.2* 78.7* 80.1 79.6* 80.9  PLT 211 189 188 168 161   Cardiac Enzymes: No results for input(s): "CKTOTAL", "CKMB", "CKMBINDEX", "TROPONINI" in the last 168 hours. CBG: Recent Labs  Lab 06/06/22 1633 06/06/22 2037 06/06/22 2359 06/07/22 0353 06/07/22 0800  GLUCAP 347* 260* 261* 245* 214*    Iron Studies: No results for input(s): "IRON", "TIBC", "TRANSFERRIN", "FERRITIN" in the last 72 hours. Studies/Results: MR BRAIN WO CONTRAST  Result Date: 06/06/2022 CLINICAL DATA:  Meningitis/CNS infection suspected EXAM: MRI HEAD WITHOUT CONTRAST TECHNIQUE: Multiplanar, multiecho pulse sequences of the brain and surrounding structures were obtained without intravenous contrast. COMPARISON:  05/25/2022 FINDINGS: Brain: Persistent abnormal focal T2 FLAIR hyperintensity. This has progressed since the prior study. Ventricle caliber is mildly increased and could reflect communicating hydrocephalus. There are patchy foci of cortical/subcortical diffusion hyperintensity particularly in the left parietal, posterior temporal, and occipital lobes. Patchy foci of T2 hyperintensity in the supratentorial white matter may reflect chronic microvascular ischemic changes. Left vertex dural-based lesion on the prior study is likely similar though not as well evaluated on this noncontrast study. No mass effect.  No extra-axial collection. Vascular: Major vessel flow voids at the skull base are preserved. Skull and upper cervical spine: Normal marrow signal is preserved.  Sinuses/Orbits: Minor mucosal thickening.  Orbits are unremarkable. Other: Sella is unremarkable. Nonspecific mastoid effusions. Retained secretions in the included pharynx. IMPRESSION: Persistent findings of meningitis. Extent of abnormal sulcal signal has progressed. Mildly increase in caliber of ventricles may reflect communicating hydrocephalus. Patchy foci of abnormal cortical/subcortical diffusion may reflect acute ischemia, seizure related changes, and/or encephalitis. Electronically Signed   By: Macy Mis M.D.   On: 06/06/2022 15:16    Chlorhexidine Gluconate Cloth  6 each Topical Daily   docusate  100 mg Per Tube BID   fentaNYL (SUBLIMAZE) injection  200 mcg Intravenous Once   insulin aspart  0-20 Units Subcutaneous Q4H   insulin aspart  8 Units Subcutaneous Q4H   insulin detemir  40 Units Subcutaneous BID   midazolam  5 mg Intravenous Once   midodrine  10 mg Per Tube TID WC   multivitamin  1 tablet Per Tube QHS   mouth rinse  15 mL Mouth Rinse Q2H   pantoprazole sodium  40 mg Per Tube Daily   perampanel  4 mg Per Tube Q1200   PHENObarbital  65 mg Intravenous BID   polyethylene glycol  17 g Per Tube Daily   predniSONE  80 mg Per Tube Q breakfast   Followed by   Derrill Memo ON 06/09/2022] predniSONE  40 mg Per Tube Q breakfast   Followed by   Derrill Memo ON 06/11/2022] predniSONE  20 mg Per Tube Q breakfast   sodium chloride flush  10-40 mL Intracatheter Q12H    BMET    Component Value Date/Time   NA 136 06/07/2022 0350   NA 141 11/30/2021 1643   K 5.4 (H) 06/07/2022 0350   CL 102 06/07/2022 0350   CO2 21 (L) 06/07/2022 0350   GLUCOSE 251 (H) 06/07/2022 0350   BUN 104 (H) 06/07/2022 0350   BUN 15 11/30/2021 1643   CREATININE 2.00 (H) 06/07/2022 0350   CREATININE 0.76 04/05/2022 0830   CALCIUM 8.1 (L) 06/07/2022 0350   GFRNONAA 27 (L) 06/07/2022 0350   GFRNONAA >60 04/05/2022 0830   GFRAA >60 07/09/2020 0743   CBC    Component Value Date/Time   WBC 16.3 (H) 06/07/2022 0350    RBC 3.20 (L) 06/07/2022 0350   HGB 8.1 (L) 06/07/2022 0350   HGB 12.9 04/05/2022 0830   HCT 25.9 (L) 06/07/2022 0350   PLT 161 06/07/2022 0350   PLT 222 04/05/2022 0830   MCV  80.9 06/07/2022 0350   MCH 25.3 (L) 06/07/2022 0350   MCHC 31.3 06/07/2022 0350   RDW 21.3 (H) 06/07/2022 0350   LYMPHSABS 0.3 (L) 06/06/2022 0431   MONOABS 1.0 06/06/2022 0431   EOSABS 0.1 06/06/2022 0431   BASOSABS 0.0 06/06/2022 0431     Assessment/Plan: AKI - b/l creat 0.8 on admission. Creat rising now in setting of new onset shock. Pt admitted for viral meningoencephalitis complicated by critical cervical myopathy and now resp failure w/ multifocal PNA. Pt has lymphoma receiving active chemoRx (rituximab) w/ last dose in June 2023. AKI due to shock which started in last 24 hrs abruptly. CCM suspecting acute PE, s/p TPA and now on IV heparin. K+ up to 6.8 -> CCRT 7/30 w/ all 2K fluids -> 4K now. No signs of renal recovery. Very minimal UOP.  CRRT with 2K fluids and adjust as needed. K currently 4.8 (had been 5.4 for 2 days).  On low dose Levophed 1-2 mcg (primarily bec of the heavy sedation) and was tolerating 100 ml/hr (50-100). Now 4.6 L pos during hospitalization.   CRRT stopped early 06/06/22, however her K is up to 6 and BUN is up to 104.  We will resume CRRT today.     Currently too unstable to transition to IHD Volume - mod edema on exam, I/O + 3 L since admit. Tolerated UF of 50-16m/hr. Shock - maybe from multilobar PNA + viral menigoencephalitis, as above, on vasopressor support,  H/o lymphoma - getting active chemoRx w/ last dose rituxan in June 2023 AHRF - on vent, multifocal PNA on broad spec IV abx per CCM Status epilepticus on propofol AMS - in setting of viral meningoencephalitis  Cervical myelopathy  DM2 - on insulin  JDonetta Potts MD [facility]

## 2022-06-08 ENCOUNTER — Inpatient Hospital Stay (HOSPITAL_COMMUNITY): Payer: Medicare Other

## 2022-06-08 ENCOUNTER — Other Ambulatory Visit: Payer: Self-pay

## 2022-06-08 DIAGNOSIS — R4182 Altered mental status, unspecified: Secondary | ICD-10-CM | POA: Diagnosis not present

## 2022-06-08 DIAGNOSIS — G039 Meningitis, unspecified: Secondary | ICD-10-CM | POA: Diagnosis not present

## 2022-06-08 DIAGNOSIS — G40901 Epilepsy, unspecified, not intractable, with status epilepticus: Secondary | ICD-10-CM | POA: Diagnosis not present

## 2022-06-08 HISTORY — PX: IR GASTROSTOMY TUBE MOD SED: IMG625

## 2022-06-08 LAB — PHENOBARBITAL LEVEL: Phenobarbital: <5 ug/mL — ABNORMAL LOW (ref 15.0–40.0)

## 2022-06-08 LAB — GLUCOSE, CAPILLARY
Glucose-Capillary: 103 mg/dL — ABNORMAL HIGH (ref 70–99)
Glucose-Capillary: 127 mg/dL — ABNORMAL HIGH (ref 70–99)
Glucose-Capillary: 65 mg/dL — ABNORMAL LOW (ref 70–99)
Glucose-Capillary: 101 mg/dL — ABNORMAL HIGH (ref 70–99)
Glucose-Capillary: 150 mg/dL — ABNORMAL HIGH (ref 70–99)
Glucose-Capillary: 104 mg/dL — ABNORMAL HIGH (ref 70–99)
Glucose-Capillary: 101 mg/dL — ABNORMAL HIGH (ref 70–99)
Glucose-Capillary: 115 mg/dL — ABNORMAL HIGH (ref 70–99)
Glucose-Capillary: 91 mg/dL (ref 70–99)

## 2022-06-08 LAB — RENAL FUNCTION PANEL
Albumin: 2.1 g/dL — ABNORMAL LOW (ref 3.5–5.0)
Albumin: 2.3 g/dL — ABNORMAL LOW (ref 3.5–5.0)
Anion gap: 13 (ref 5–15)
Anion gap: 11 (ref 5–15)
BUN: 66 mg/dL — ABNORMAL HIGH (ref 8–23)
BUN: 56 mg/dL — ABNORMAL HIGH (ref 8–23)
CO2: 24 mmol/L (ref 22–32)
CO2: 24 mmol/L (ref 22–32)
Calcium: 7.7 mg/dL — ABNORMAL LOW (ref 8.9–10.3)
Calcium: 7.8 mg/dL — ABNORMAL LOW (ref 8.9–10.3)
Chloride: 100 mmol/L (ref 98–111)
Chloride: 102 mmol/L (ref 98–111)
Creatinine, Ser: 1.27 mg/dL — ABNORMAL HIGH (ref 0.44–1.00)
Creatinine, Ser: 1.16 mg/dL — ABNORMAL HIGH (ref 0.44–1.00)
GFR, Estimated: 46 mL/min — ABNORMAL LOW (ref >60)
GFR, Estimated: 52 mL/min — ABNORMAL LOW (ref >60)
Glucose, Bld: 115 mg/dL — ABNORMAL HIGH (ref 70–99)
Glucose, Bld: 101 mg/dL — ABNORMAL HIGH (ref 70–99)
Phosphorus: 5 mg/dL — ABNORMAL HIGH (ref 2.5–4.6)
Phosphorus: 6.1 mg/dL — ABNORMAL HIGH (ref 2.5–4.6)
Potassium: 3.6 mmol/L (ref 3.5–5.1)
Potassium: 4 mmol/L (ref 3.5–5.1)
Sodium: 137 mmol/L (ref 135–145)
Sodium: 137 mmol/L (ref 135–145)

## 2022-06-08 LAB — HEPARIN LEVEL (UNFRACTIONATED)
Heparin Unfractionated: 0.41 IU/mL (ref 0.30–0.70)
Heparin Unfractionated: 0.32 IU/mL (ref 0.30–0.70)

## 2022-06-08 LAB — CBC
HCT: 23.7 % — ABNORMAL LOW (ref 36.0–46.0)
Hemoglobin: 7.4 g/dL — ABNORMAL LOW (ref 12.0–15.0)
MCH: 25.2 pg — ABNORMAL LOW (ref 26.0–34.0)
MCHC: 31.2 g/dL (ref 30.0–36.0)
MCV: 80.6 fL (ref 80.0–100.0)
Platelets: 169 10*3/uL (ref 150–400)
RBC: 2.94 MIL/uL — ABNORMAL LOW (ref 3.87–5.11)
RDW: 22.1 % — ABNORMAL HIGH (ref 11.5–15.5)
WBC: 12.9 10*3/uL — ABNORMAL HIGH (ref 4.0–10.5)
nRBC: 0.2 % (ref 0.0–0.2)

## 2022-06-08 LAB — MAGNESIUM: Magnesium: 2.4 mg/dL (ref 1.7–2.4)

## 2022-06-08 MED ORDER — HEPARIN SOD (PORK) LOCK FLUSH 100 UNIT/ML IV SOLN
INTRAVENOUS | Status: AC | PRN
  Administered 2022-06-08: 500 [IU]

## 2022-06-08 MED ORDER — MIDAZOLAM HCL 2 MG/2ML IJ SOLN
INTRAMUSCULAR | Status: AC
  Filled 2022-06-08: qty 2

## 2022-06-08 MED ORDER — GLUCAGON HCL (RDNA) 1 MG IJ SOLR
INTRAMUSCULAR | Status: AC | PRN
  Administered 2022-06-08: .5 mg via INTRAVENOUS

## 2022-06-08 MED ORDER — VITAL AF 1.2 CAL PO LIQD
1000.0000 mL | ORAL | Status: DC
  Administered 2022-06-09 – 2022-06-24 (×19): 1000 mL
  Filled 2022-06-08: qty 1000

## 2022-06-08 MED ORDER — SODIUM CHLORIDE 0.9 % IV SOLN: INTRAVENOUS | Status: DC

## 2022-06-08 MED ORDER — LIDOCAINE HCL 1 % IJ SOLN
INTRAMUSCULAR | Status: AC
  Administered 2022-06-08: 5 mL
  Filled 2022-06-08: qty 20

## 2022-06-08 MED ORDER — IOHEXOL 300 MG/ML  SOLN
100.0000 mL | Freq: Once | INTRAMUSCULAR | Status: AC | PRN
  Administered 2022-06-08: 20 mL

## 2022-06-08 MED ORDER — HEPARIN (PORCINE) 25000 UT/250ML-% IV SOLN
1250.0000 [IU]/h | INTRAVENOUS | Status: DC
  Administered 2022-06-08 – 2022-06-09 (×2): 1050 [IU]/h via INTRAVENOUS
  Administered 2022-06-10 – 2022-06-11 (×2): 1200 [IU]/h via INTRAVENOUS
  Administered 2022-06-12 (×2): 1250 [IU]/h via INTRAVENOUS
  Filled 2022-06-08 (×5): qty 250

## 2022-06-08 MED ORDER — FENTANYL CITRATE (PF) 100 MCG/2ML IJ SOLN
INTRAMUSCULAR | Status: AC
  Filled 2022-06-08: qty 2

## 2022-06-08 MED ORDER — INSULIN DETEMIR 100 UNIT/ML ~~LOC~~ SOLN
30.0000 [IU] | Freq: Two times a day (BID) | SUBCUTANEOUS | Status: DC
  Filled 2022-06-08 (×2): qty 0.3

## 2022-06-08 MED ORDER — DEXTROSE 50 % IV SOLN
12.5000 g | INTRAVENOUS | Status: AC
  Administered 2022-06-08: 12.5 g via INTRAVENOUS

## 2022-06-08 MED ORDER — GLUCAGON HCL RDNA (DIAGNOSTIC) 1 MG IJ SOLR
INTRAMUSCULAR | Status: AC
  Filled 2022-06-08: qty 1

## 2022-06-08 NOTE — Progress Notes (Signed)
Patient ID: Valerie Cline, female   DOB: September 07, 1956, 66 y.o.   MRN: [id number] S: Resumed CRRT yesterday.  Still unresponsive off of sedation. O:BP (!) 137/56   Pulse 97   Temp 98.1 F (36.7 C) (Rectal)   Resp (!) 26   Ht 5' 5.98" (1.676 m)   Wt 81.6 kg   SpO2 97%   BMI 29.05 kg/m   Intake/Output Summary (Last 24 hours) at 06/08/2022 0958 Last data filed at 06/08/2022 0900 Gross per 24 hour  Intake 2043.17 ml  Output 3737 ml  Net -1693.83 ml   Intake/Output: I/O last 3 completed shifts: In: 5625 [I.V.:565.6; Other:30; NG/GT:2240; IV Piggyback:635.3] Out: 4300 [Urine:243; Other:250; Stool:930]  Intake/Output this shift:  Total I/O In: 228.6 [I.V.:68.6; NG/GT:60; IV Piggyback:100] Out: 345 [Urine:15; Stool:40] Weight change:  WLS:[id number] ill-appearing, unresponsive, trach in place CVS:RRR Resp: CTA Abd:+BS, soft, NT/nD Ext: 1+ BLE edema  Recent Labs  Lab 06/02/22 0500 06/02/22 1530 06/04/22 1600 06/05/22 0402 06/05/22 1606 06/06/22 0431 06/07/22 0350 06/07/22 1704 06/08/22 0342  NA 135   < > 137 136 136 135 136 136 137  K 4.5   < > 5.1 4.8 4.3 3.4* 5.4* 5.6* 3.6  CL 102   < > 104 103 103 102 102 102 100  CO2 23   < > '25 25 25 26 '$ 21* 23 24  GLUCOSE 186*   < > 133* 180* 159* 149* 251* 250* 115*  BUN 40*   < > 55* 50* 47* 43* 104* 97* 66*  CREATININE 1.15*   < > 1.23* 1.01* 0.95 0.92 2.00* 1.84* 1.27*  ALBUMIN 2.4*  2.4*   < > 2.2* 2.2* 2.3* 2.2* 2.2* 2.2* 2.1*  CALCIUM 7.6*   < > 7.8* 7.7* 7.8* 7.8* 8.1* 7.7* 7.7*  PHOS 3.7   < > 5.3* 4.3 4.4 3.6 10.2* 10.1* 5.0*  AST 201*  --   --   --   --   --   --   --   --   ALT 1,482*  --   --   --   --   --   --   --   --    < > = values in this interval not displayed.   Liver Function Tests: Recent Labs  Lab 06/02/22 0500 06/02/22 1530 06/07/22 0350 06/07/22 1704 06/08/22 0342  AST 201*  --   --   --   --   ALT 1,482*  --   --   --   --   ALKPHOS 164*  --   --   --   --   BILITOT 1.3*  --   --   --   --    PROT 6.0*  --   --   --   --   ALBUMIN 2.4*  2.4*   < > 2.2* 2.2* 2.1*   < > = values in this interval not displayed.   No results for input(s): "LIPASE", "AMYLASE" in the last 168 hours. No results for input(s): "AMMONIA" in the last 168 hours. CBC: Recent Labs  Lab 06/05/22 0402 06/06/22 0022 06/06/22 0431 06/07/22 0350 06/08/22 0342  WBC 30.3* 20.8* 18.8* 16.3* 12.9*  NEUTROABS 27.6* 18.5* 16.9*  --   --   HGB 8.6* 8.4* 8.4* 8.1* 7.4*  HCT 27.0* 27.0* 27.0* 25.9* 23.7*  MCV 78.7* 80.1 79.6* 80.9 80.6  PLT 189 188 168 161 169   Cardiac Enzymes: No results for input(s): "CKTOTAL", "CKMB", "CKMBINDEX", "TROPONINI" in  the last 168 hours. CBG: Recent Labs  Lab 06/07/22 2322 06/08/22 0337 06/08/22 0752 06/08/22 0821 06/08/22 0929  GLUCAP 143* 103* 65* 127* 101*    Iron Studies: No results for input(s): "IRON", "TIBC", "TRANSFERRIN", "FERRITIN" in the last 72 hours. Studies/Results: MR BRAIN WO CONTRAST  Result Date: 06/06/2022 CLINICAL DATA:  Meningitis/CNS infection suspected EXAM: MRI HEAD WITHOUT CONTRAST TECHNIQUE: Multiplanar, multiecho pulse sequences of the brain and surrounding structures were obtained without intravenous contrast. COMPARISON:  05/25/2022 FINDINGS: Brain: Persistent abnormal focal T2 FLAIR hyperintensity. This has progressed since the prior study. Ventricle caliber is mildly increased and could reflect communicating hydrocephalus. There are patchy foci of cortical/subcortical diffusion hyperintensity particularly in the left parietal, posterior temporal, and occipital lobes. Patchy foci of T2 hyperintensity in the supratentorial white matter may reflect chronic microvascular ischemic changes. Left vertex dural-based lesion on the prior study is likely similar though not as well evaluated on this noncontrast study. No mass effect.  No extra-axial collection. Vascular: Major vessel flow voids at the skull base are preserved. Skull and upper cervical spine:  Normal marrow signal is preserved. Sinuses/Orbits: Minor mucosal thickening.  Orbits are unremarkable. Other: Sella is unremarkable. Nonspecific mastoid effusions. Retained secretions in the included pharynx. IMPRESSION: Persistent findings of meningitis. Extent of abnormal sulcal signal has progressed. Mildly increase in caliber of ventricles may reflect communicating hydrocephalus. Patchy foci of abnormal cortical/subcortical diffusion may reflect acute ischemia, seizure related changes, and/or encephalitis. Electronically Signed   By: Macy Mis M.D.   On: 06/06/2022 15:16    Chlorhexidine Gluconate Cloth  6 each Topical Daily   docusate  100 mg Per Tube BID   fentaNYL (SUBLIMAZE) injection  200 mcg Intravenous Once   insulin aspart  0-20 Units Subcutaneous Q4H   midazolam  5 mg Intravenous Once   midodrine  10 mg Per Tube TID WC   multivitamin  1 tablet Per Tube QHS   mouth rinse  15 mL Mouth Rinse Q2H   pantoprazole sodium  40 mg Per Tube Daily   perampanel  4 mg Per Tube Q1200   PHENObarbital  65 mg Intravenous BID   polyethylene glycol  17 g Per Tube Daily   [START ON 06/09/2022] predniSONE  40 mg Per Tube Q breakfast   Followed by   Derrill Memo ON 06/11/2022] predniSONE  20 mg Per Tube Q breakfast   sodium chloride flush  10-40 mL Intracatheter Q12H    BMET    Component Value Date/Time   NA 137 06/08/2022 0342   NA 141 11/30/2021 1643   K 3.6 06/08/2022 0342   CL 100 06/08/2022 0342   CO2 24 06/08/2022 0342   GLUCOSE 115 (H) 06/08/2022 0342   BUN 66 (H) 06/08/2022 0342   BUN 15 11/30/2021 1643   CREATININE 1.27 (H) 06/08/2022 0342   CREATININE 0.76 04/05/2022 0830   CALCIUM 7.7 (L) 06/08/2022 0342   GFRNONAA 46 (L) 06/08/2022 0342   GFRNONAA >60 04/05/2022 0830   GFRAA >60 07/09/2020 0743   CBC    Component Value Date/Time   WBC 12.9 (H) 06/08/2022 0342   RBC 2.94 (L) 06/08/2022 0342   HGB 7.4 (L) 06/08/2022 0342   HGB 12.9 04/05/2022 0830   HCT 23.7 (L) 06/08/2022  0342   PLT 169 06/08/2022 0342   PLT 222 04/05/2022 0830   MCV 80.6 06/08/2022 0342   MCH 25.2 (L) 06/08/2022 0342   MCHC 31.2 06/08/2022 0342   RDW 22.1 (H) 06/08/2022 0342   LYMPHSABS  0.3 (L) 06/06/2022 0431   MONOABS 1.0 06/06/2022 0431   EOSABS 0.1 06/06/2022 0431   BASOSABS 0.0 06/06/2022 0431    Assessment/Plan: ARF, oliguric- b/l creat 0.8 on admission. Creat rising now in setting of new onset shock. Pt admitted for viral meningoencephalitis complicated by critical cervical myopathy and now resp failure w/ multifocal PNA. Pt has lymphoma receiving active chemoRx (rituximab) w/ last dose in June 2023. AKI due to shock which started in last 24 hrs abruptly. CCM suspecting acute PE, s/p TPA and now on IV heparin. K+ up to 6.8 -> CCRT 7/30 w/ all 2K fluids -> 4K now. No signs of renal recovery. Very minimal UOP.  CRRT with 2K fluids and adjust as needed. K currently 4.8 (had been 5.4 for 2 days).  On low dose Levophed 1-2 mcg (primarily bec of the heavy sedation) and was tolerating 100 ml/hr (50-100). Now 4.6 L pos during hospitalization.   CRRT stopped early 06/06/22, however her K is up to 6 and BUN is up to 104.  Resume CRRT 06/07/22 and will continue until 06/14/22.  If no neurological improvement, agree with recommendation to transition to comfort care after a time-limited trial of aggressive care.     Volume - mod edema on exam, I/O + 3 L since admit. Tolerated UF of 50-126m/hr. Shock - maybe from multilobar PNA + viral menigoencephalitis, as above, on vasopressor support,  H/o lymphoma - getting active chemoRx w/ last dose rituxan in June 2023 AHRF - on vent, multifocal PNA on broad spec IV abx per CCM Status epilepticus on propofol AMS - in setting of viral meningoencephalitis.  Remains unresponsive despite being off of sedation for over 96 hours and progressive changes seen on MRI.  Neurology and ID following.   Cervical myelopathy  DM2 - on insulin Disposition - remains unresponsive  off of sedation for over 96 hours.  After multidisciplinary discussion, decision was to continue with aggressive care for a time limited trial.  If by 06/14/22 she has no improvement, will recommend transitioning to comfort care.  JDonetta Potts MD [facility]

## 2022-06-08 NOTE — Progress Notes (Signed)
ANTICOAGULATION CONSULT NOTE - Follow Up Consult  Pharmacy Consult for heparin Indication: pulmonary embolus  Labs: Recent Labs    06/06/22 0431 06/07/22 0350 06/07/22 1700 06/07/22 1704 06/08/22 0342  HGB 8.4* 8.1*  --   --  7.4*  HCT 27.0* 25.9*  --   --  23.7*  PLT 168 161  --   --  169  HEPARINUNFRC <0.10*  --  0.52  --  0.41  CREATININE 0.92 2.00*  --  1.84*  --     Assessment/Plan:  66yo female therapeutic on heparin after rate change. Will continue infusion at current rate of 1050 units/hr and confirm stable with additional level.   Wynona Neat, PharmD, BCPS  06/08/2022,4:36 AM

## 2022-06-08 NOTE — Progress Notes (Signed)
RT assisted with patient transport on a vent from IR to [id number] without complications.

## 2022-06-08 NOTE — IPAL (Signed)
  Interdisciplinary Goals of Care Family Meeting   Date carried out: 06/08/2022  Location of the meeting:  Conference room [place]  Member's involved: Physician and Other: Neurology and [place] or acting medical decision maker: Husband/sister/daughter    Discussion: We discussed goals of care for Valerie Cline .    Discussed concerning persistent vegetative state after sedation weaned.  Timeline is uncertain but diminishing likelihood of meaningful recovery with each day of persistent coma.  All in agreement to continue current aggressive care until at least 8/16 (10 days from sedation cessation) and revisit GOC if persistent coma.  Dr. Hortense Ramal will continue to follow and help provide continuity in these difficult discussions.  Code status: Full Code  Disposition: Continue current acute care  Time spent for the meeting: 20 mins    Valerie Furbish, MD  06/08/2022, 3:40 PM

## 2022-06-08 NOTE — Procedures (Signed)
Interventional Radiology Procedure Note  Procedure: 71f pull thru gtube    Complications: None  Estimated Blood Loss:  min  Findings: Confirmed in the stomach Full use tomorrow    MTamera Punt MD

## 2022-06-08 NOTE — Progress Notes (Addendum)
ANTICOAGULATION CONSULT NOTE  Pharmacy Consult for heparin Indication: pulmonary embolus  Allergies  Allergen Reactions   Canagliflozin Other (See Comments)    recurrent vaginitis   Glucophage [Metformin] Other (See Comments)    GI intolerance   Niaspan [Niacin] Other (See Comments)    flushing    Patient Measurements: Height: 5' 5.98" (167.6 cm) Weight: 81.6 kg (179 lb 14.3 oz) IBW/kg (Calculated) : 59.25 Heparin Dosing Weight: 79kg  Vital Signs: Temp: 97.7 F (36.5 C) (08/09 1300) Temp Source: Rectal (08/09 1300) BP: 150/60 (08/09 1445) Pulse Rate: 57 (08/09 1445)  Labs: Recent Labs    06/06/22 0431 06/07/22 0350 06/07/22 1700 06/07/22 1704 06/08/22 0342  HGB 8.4* 8.1*  --   --  7.4*  HCT 27.0* 25.9*  --   --  23.7*  PLT 168 161  --   --  169  HEPARINUNFRC <0.10*  --  0.52  --  0.41  CREATININE 0.92 2.00*  --  1.84* 1.27*     Estimated Creatinine Clearance: 46.3 mL/min (A) (by C-G formula based on SCr of 1.27 mg/dL (H)).   Assessment: 23 yoF admitted with meningitis from OSH. Pt with ongoing fevers and worsening clinical status 7/29, RV severely down on beside ECHO so concerns for acute PE. Pt received alteplase on 7/30 for suspected saddle PE.    Heparin was on hold given bleeding at trach on 8/7. Trach bleeding improved today - okay per MD to restart and would like with bolus.  Hgb 8.1, plt 161.   Per IR plans, okay to resume heparin 4 hours post sheath removal. Previous rate was therapeutic.  Goal of Therapy:  Heparin level 0.3-0.5 units/mL Monitor platelets by anticoagulation protocol: Yes   Plan:  Resume heparin at 1050 units/hr at 1530 No bolus with recent procedure Obtain heparin level in 8 hours  Daily heparin level and CBC while on heparin Monitor for s/sx bleeding   Thank you for allowing pharmacy to participate in this patient's care.  Reatha Harps, PharmD PGY2 Pharmacy Resident 06/08/2022 3:12 PM Check [email or web address] for unit specific  pharmacy number

## 2022-06-08 NOTE — Procedures (Signed)
Patient Name: Valerie Cline  MRN: [id number]  Epilepsy Attending: Lora Havens  Referring Physician/Provider: Greta Doom, MD   Duration: 06/07/2022 1014 to 06/08/2022 1014   Patient history: 66yo F with ams. EEG to evaluate for seizure   Level of alertness: comatose   AEDs during EEG study: Keppra, Vimpat, perampanel, Phenobarb   Technical aspects: This EEG study was done with scalp electrodes positioned according to the 10-20 International system of electrode placement. Electrical activity was acquired at a sampling rate of '500Hz'$  and reviewed with a high frequency filter of '70Hz'$  and a low frequency filter of '1Hz'$ . EEG data were recorded continuously and digitally stored.    Description: EEG showed near continuous polymorphic sharply contoured 3 to 7 Hz theta-delta slowing. Polyspikes were also noted in left posterior quadrant which at times appeared quasiperiodic  at 1 Hz and rhythmic lasting 3-6 seconds consistent with brief-ictal-interictal rhythmic discharges.    ABNORMALITY -Brief-ictal-interictal rhythmic discharges, left posterior quadrant -Polyspikes, left posterior quadrant -Continuous slow, generalized   IMPRESSION: This study showed evidence of epileptogenicity in left posterior quadrant with high risk for seizure recurrence.  Additionally there was severe diffuse encephalopathy. No definite seizures were seen during this study.   Steve Gregg [name]

## 2022-06-08 NOTE — Progress Notes (Signed)
Nutrition Follow-up  DOCUMENTATION CODES:   Not applicable  INTERVENTION:   Tube Feeding via PEG:  Vital AF 1.2 at 65 ml/hr This provides 117 g of protein, 1872 kcals, 1264 mL per hour   Add Renal MVI daily  NUTRITION DIAGNOSIS:   Inadequate oral intake related to acute illness as evidenced by NPO status.  Being addressed via TF  GOAL:   Patient will meet greater than or equal to 90% of their needs  Met via TF   MONITOR:   Vent status, Labs, Weight trends, TF tolerance  REASON FOR ASSESSMENT:   Consult, Ventilator Enteral/tube feeding initiation and management  ASSESSMENT:   66 yo female admitted with acute respiratory failure secondary to pneumonia, acute encephalopathy secondary to suspected bacterial vs viral meningitis in immunocompromised patient. 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 Off sedation for several days  Tolerating Vital AF 1.2 at 60 ml/hr, on hold since midnight for procedure.  20 french PEG placed by IR today. Plan to resume TF  Noted family meeting today with Neurology and PCCM. Noted plan to continue aggressive care until at least 8/16. Remains full code  Rectal tube in place; recommend discontinuing bowel regimen at this time  Phosphorus high when CRRT stopped but trending back down now that CRRT restarted. No signs of renal recovery per Nephrology. Plan to continue CRRT  Labs: phosphorus 6.0 (H) Meds: ss novolog, Rena-Vite, miralax, prednisone, colace  Diet Order:   Diet Order             Diet NPO time specified Except for: Sips with Meds  Diet effective midnight                   EDUCATION  NEEDS:   Not appropriate for education at this time  Skin:  Skin Assessment: Skin Integrity Issues: Skin Integrity Issues:: Stage II Stage II: sacrum-small, stable per RN  Last BM:  8/9  Height:   Ht Readings from Last 1 Encounters:  05/26/22 5' 5.98" (1.676 m)    Weight:   Wt Readings from Last 1 Encounters:  06/08/22 81.6 kg     BMI:  Body mass index is 29.05 kg/m.  Estimated Nutritional Needs:   Kcal:  1700-1900 kcals  Protein:  90-110 g  Fluid:  >/= 1.7 L   Kerman Passey MS, RDN, LDN, CNSC Registered Dietitian 3 Clinical Nutrition RD Pager and On-Call Pager Number Located in [place]

## 2022-06-08 NOTE — Progress Notes (Signed)
Inpatient Diabetes Program Recommendations  AACE/ADA: New Consensus Statement on Inpatient Glycemic Control (2015)  Target Ranges:  Prepandial:   less than 140 mg/dL      Peak postprandial:   less than 180 mg/dL (1-2 hours)      Critically ill patients:  140 - 180 mg/dL   Lab Results  Component Value Date   GLUCAP 65 (L) 06/08/2022   HGBA1C 8.0 (H) 05/24/2022    Review of Glycemic Control  Latest Reference Range & Units 06/07/22 23:22 06/08/22 03:37 06/08/22 07:52  Glucose-Capillary 70 - 99 mg/dL 143 (H) 103 (H) 65 (L)   Diabetes history: DM 2 Outpatient Diabetes medications:  U500 insulin-80 units with breakfast, 50 units with lunch, and 90 units with supper Current orders for Inpatient glycemic control:  Levemir 40 units bid Novolog 8 units q 4 hours Novolog 0-20 units q 4 hours Vital 60 ml/hr- Tube feed currently on HOLD  Inpatient Diabetes Program Recommendations:    Recommend adding D10 at 40 cc/ hour to prevent hypoglycemia while tube feeds are held.    Thanks,  Adah Perl, RN, BC-ADM Inpatient Diabetes Coordinator Pager [phone]  (8a-5p)

## 2022-06-08 NOTE — Progress Notes (Signed)
[facility] Infectious Diseases Follow Up Note  Patient Identification: Patient Name: Valerie Cline MRN: [id number] [place] Date: 05/28/2022  9:11 PM Age: 66 y.o.Today's Date: 06/08/2022  Reason for Visit: Viral meningoencephalitis/leukocytosis  Principal Problem:   Meningitis Active Problems:   Severe sepsis ([place])   Acute metabolic encephalopathy   Acute respiratory failure with hypoxia (HCC)   Multifocal pneumonia   Meningioma (HCC)   Cervical myelopathy (HCC)   UTI (urinary tract infection)   Pressure injury of skin  Meropenem 7/28-7/31, 8/3-8/7 Vancomycin 7/24-7/26, Vancomycin 7/30-7/31 Ceftriaxone 7/24, 7/27, Ceftriaxone 8/1-8/2 Metronidazole 8/1-8/2 Cefepime 7/25-7/26 Ampicillin 7/24-7/25 Acyclovir 7/24-7/26    Lines/Hardwares: RT IJ non tunneled HD Catheter, Left UE PICC, Tracheostomy   Interval Events: remains afebrile, WBC down trending, off levophed this morning. 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 Polyspikes, left posterior quadrant. Continuous slow, generalized -Neurology following   # Acute Respiratory Failure in the setting of encephalopathy from above 7/25 BAL cx AFB negative, gram stain and cx negative, fungal cx pending  7/28 tracheal aspirate cx normal resp flora S/p tracheostomy 8/4  #Suspected saddle pulmonary embolism in the setting of sudden onset shock/acute renal and liver failure, RV dilatation with severe metabolic acidosis Status post tPA with improvement in hemodynamics, IV  heparin resumed  CRRT started back   # Critical Cervical Myelopathy/ACDF C5-C7/Progressive Weakness and Tremors  On tapering prednisone Neurology following  Recommendations Discussed with PCCM and Neurology yesterday,  agree with 7 day trial to see if any neurologic recovery Continue Droplet precautions for now Poor prognosis ID available as needed, please recall if needed.   Thank you for the consult. Please page with pertinent questions or concerns. _____________________________________________________________________ Subjective patient seen and examined at the bedside.  Spoke with her daughter   Vitals BP (!) 137/56   Pulse 97   Temp 98.1 F (36.7 C) (Rectal)   Resp (!) 26   Ht 5' 5.98" (1.676 m)   Wt 81.6 kg   SpO2 97%   BMI 29.05 kg/m   Physical Exam Constitutional: EEG leads on head, on tube feeds     Comments:   Cardiovascular:     Rate and Rhythm: Normal rate and regular rhythm.     Heart sounds: tracheostomy site ( bleeding resolved )  Pulmonary:     Effort: Pulmonary effort is normal on trach to vent     Comments: Coarse breath sounds  Abdominal:     Palpations: Abdomen is soft.     Tenderness: Nondistended and nontender  Musculoskeletal:        General: No swelling  in peripheral joints. Anasarca   Skin:    Comments: RT IJ non tunneled HD Catheter, Left UE PICC  Neurological:     General: Unresponsive   Pertinent Microbiology Results for orders placed or performed during the hospital encounter of 05/04/2022  MRSA Next Gen by PCR, Nasal     Status: None   Collection Time: 05/13/2022  9:42 PM   Specimen: Nasal Mucosa; Nasal Swab  Result Value Ref Range Status   MRSA by PCR Next Gen NOT DETECTED NOT DETECTED Final    Comment: (NOTE)  The GeneXpert MRSA Assay (FDA approved for NASAL specimens only), is one component of a comprehensive MRSA colonization surveillance program. It is not intended to diagnose MRSA infection nor to guide or monitor  treatment for MRSA infections. Test performance is not FDA approved in patients less than 30 years old. Performed at [facility], [place] [address] [place], [place] [address]   Urine Culture     Status: None   Collection Time: 05/24/22  2:11 AM   Specimen: Urine  Result Value Ref Range Status   Specimen Description URINE, CLEAN CATCH  Final   Special Requests NONE  Final   Culture   Final    NO GROWTH Performed at [facility], [address] [place], [place] [address]    Report Status 05/25/2022 FINAL  Final  Culture, blood (Routine X 2) w Reflex to ID Panel     Status: None   Collection Time: 05/24/22  6:16 AM   Specimen: BLOOD RIGHT HAND  Result Value Ref Range Status   Specimen Description BLOOD RIGHT HAND  Final   Special Requests   Final    BOTTLES DRAWN AEROBIC AND ANAEROBIC Blood Culture adequate volume   Culture   Final    NO GROWTH 5 DAYS Performed at [facility], [place] [address] [place], [place] [address]    Report Status 05/29/2022 FINAL  Final  Culture, blood (Routine X 2) w Reflex to ID Panel     Status: None   Collection Time: 05/24/22  6:17 AM   Specimen: BLOOD LEFT HAND  Result Value Ref Range Status   Specimen Description BLOOD LEFT HAND  Final   Special Requests   Final    BOTTLES DRAWN AEROBIC AND ANAEROBIC Blood Culture adequate volume   Culture   Final    NO GROWTH 5 DAYS Performed at [facility], [place] [address], [place], [place] [address]    Report Status 05/29/2022 FINAL  Final  Culture, Respiratory w Gram Stain     Status: None   Collection Time: 05/24/22  9:24 AM   Specimen: Bronchoalveolar Lavage; Respiratory  Result Value Ref Range Status   Specimen Description BRONCHIAL ALVEOLAR LAVAGE  Final   Special Requests Immunocompromised  Final   Gram Stain   Final    RARE WBC PRESENT, PREDOMINANTLY MONONUCLEAR NO ORGANISMS SEEN    Culture   Final    NO GROWTH 2 DAYS Performed at [facility], [address] [place], [place] [address]    Report Status 05/26/2022 FINAL  Final  Acid Fast Smear (AFB)     Status: None   Collection Time: 05/24/22  9:24 AM   Specimen: Bronchial Alveolar Lavage  Result Value Ref Range Status   AFB Specimen Processing Concentration  Final   Acid Fast Smear Negative  Final    Comment: (NOTE) Performed At: [facility] [place], [place] [id number] Rush Farmer MD [id number]    Source (AFB) BRONCHIAL ALVEOLAR LAVAGE  Final    Comment: Performed at [facility], [place] [address], [place], [place] [address]  Fungus Culture With Stain     Status: None (Preliminary result)   Collection Time: 05/24/22  9:24 AM   Specimen: Bronchial Alveolar Lavage  Result Value Ref Range Status   Fungus Stain Final report  Final    Comment: (NOTE) Performed At: [facility] [place], [place] [id number] Rush Farmer MD [id number]  Fungus (Mycology) Culture PENDING  Incomplete   Fungal Source BRONCHIAL ALVEOLAR LAVAGE  Final    Comment: Performed at [facility], [place] [address] [place], [place] [address]  Respiratory (~20 pathogens) panel by PCR     Status: None   Collection Time: 05/24/22  9:24 AM   Specimen: Bronchial Alveolar Lavage; Respiratory  Result Value Ref Range Status   Adenovirus NOT DETECTED NOT DETECTED Final   Coronavirus 229E NOT DETECTED NOT DETECTED Final    Comment: (NOTE) The Coronavirus on the Respiratory Panel, DOES NOT test for the novel  Coronavirus (2019 nCoV)    Coronavirus HKU1 NOT DETECTED NOT DETECTED Final   Coronavirus NL63 NOT DETECTED NOT DETECTED Final   Coronavirus OC43 NOT DETECTED NOT DETECTED Final   Metapneumovirus NOT DETECTED NOT DETECTED Final   Rhinovirus / Enterovirus NOT DETECTED NOT DETECTED Final   Influenza A NOT DETECTED NOT DETECTED Final   Influenza B NOT DETECTED NOT DETECTED Final   Parainfluenza Virus 1 NOT DETECTED NOT DETECTED Final   Parainfluenza Virus  2 NOT DETECTED NOT DETECTED Final   Parainfluenza Virus 3 NOT DETECTED NOT DETECTED Final   Parainfluenza Virus 4 NOT DETECTED NOT DETECTED Final   Respiratory Syncytial Virus NOT DETECTED NOT DETECTED Final   Bordetella pertussis NOT DETECTED NOT DETECTED Final   Bordetella Parapertussis NOT DETECTED NOT DETECTED Final   Chlamydophila pneumoniae NOT DETECTED NOT DETECTED Final   Mycoplasma pneumoniae NOT DETECTED NOT DETECTED Final    Comment: Performed at [facility], [place]. [address], [place], [place] [address]  Fungus Culture Result     Status: None   Collection Time: 05/24/22  9:24 AM  Result Value Ref Range Status   Result 1 Comment  Final    Comment: (NOTE) KOH/Calcofluor preparation:  no fungus observed. Performed At: [facility] [place], [place] [id number] Rush Farmer MD [id number]   Culture, Respiratory w Gram Stain     Status: None   Collection Time: 05/27/22  8:31 AM   Specimen: Tracheal Aspirate; Respiratory  Result Value Ref Range Status   Specimen Description TRACHEAL ASPIRATE  Final   Special Requests NONE  Final   Gram Stain   Final    ABUNDANT WBC PRESENT, PREDOMINANTLY PMN FEW GRAM NEGATIVE RODS RARE GRAM POSITIVE COCCI IN PAIRS    Culture   Final    Normal respiratory flora-no Staph aureus or Pseudomonas seen Performed at [facility], [address] [place], [place] [address]    Report Status 05/29/2022 FINAL  Final  Respiratory (~20 pathogens) panel by PCR     Status: Abnormal   Collection Time: 05/28/22  1:53 PM   Specimen: Nasopharyngeal Swab; Respiratory  Result Value Ref Range Status   Adenovirus NOT DETECTED NOT DETECTED Final   Coronavirus 229E NOT DETECTED NOT DETECTED Final    Comment: (NOTE) The Coronavirus on the Respiratory Panel, DOES NOT test for the novel  Coronavirus (2019 nCoV)    Coronavirus HKU1 NOT DETECTED NOT DETECTED Final   Coronavirus NL63 NOT DETECTED NOT DETECTED Final    Coronavirus OC43 NOT DETECTED NOT DETECTED Final   Metapneumovirus NOT DETECTED NOT DETECTED Final   Rhinovirus / Enterovirus DETECTED (A) NOT DETECTED Final   Influenza A NOT DETECTED NOT DETECTED Final   Influenza B NOT DETECTED NOT DETECTED Final   Parainfluenza Virus 1 NOT DETECTED NOT DETECTED Final   Parainfluenza Virus 2 NOT DETECTED NOT DETECTED Final  Parainfluenza Virus 3 NOT DETECTED NOT DETECTED Final   Parainfluenza Virus 4 NOT DETECTED NOT DETECTED Final   Respiratory Syncytial Virus NOT DETECTED NOT DETECTED Final   Bordetella pertussis NOT DETECTED NOT DETECTED Final   Bordetella Parapertussis NOT DETECTED NOT DETECTED Final   Chlamydophila pneumoniae NOT DETECTED NOT DETECTED Final   Mycoplasma pneumoniae NOT DETECTED NOT DETECTED Final    Comment: Performed at [facility], [place] [address] [place], [place] [address]  Culture, blood (Routine X 2) w Reflex to ID Panel     Status: None   Collection Time: 05/29/22  8:58 AM   Specimen: BLOOD RIGHT HAND  Result Value Ref Range Status   Specimen Description BLOOD RIGHT HAND  Final   Special Requests   Final    BOTTLES DRAWN AEROBIC AND ANAEROBIC Blood Culture adequate volume   Culture   Final    NO GROWTH 5 DAYS Performed at [facility], [place] [address] [place], [place] [address]    Report Status 06/03/2022 FINAL  Final  Culture, blood (Routine X 2) w Reflex to ID Panel     Status: None   Collection Time: 05/29/22  8:58 AM   Specimen: BLOOD RIGHT HAND  Result Value Ref Range Status   Specimen Description BLOOD RIGHT HAND  Final   Special Requests IN PEDIATRIC BOTTLE Blood Culture adequate volume  Final   Culture   Final    NO GROWTH 5 DAYS Performed at [facility], [place] [address], [place], [place] [address]    Report Status 06/03/2022 FINAL  Final   Pertinent Lab.    Latest Ref Rng & Units 06/08/2022    3:42 AM 06/07/2022    3:50 AM 06/06/2022    4:31 AM  CBC  WBC 4.0 - 10.5 K/uL 12.9  16.3   18.8   Hemoglobin 12.0 - 15.0 g/dL 7.4  8.1  8.4   Hematocrit 36.0 - 46.0 % 23.7  25.9  27.0   Platelets 150 - 400 K/uL 169  161  168       Latest Ref Rng & Units 06/08/2022    3:42 AM 06/07/2022    5:04 PM 06/07/2022    3:50 AM  CMP  Glucose 70 - 99 mg/dL 115  250  251   BUN 8 - 23 mg/dL 66  97  104   Creatinine 0.44 - 1.00 mg/dL 1.27  1.84  2.00   Sodium 135 - 145 mmol/L 137  136  136   Potassium 3.5 - 5.1 mmol/L 3.6  5.6  5.4   Chloride 98 - 111 mmol/L 100  102  102   CO2 22 - 32 mmol/L _0 Calcium 8.9 - 10.3 mg/dL 7.7  7.7  8.1      Pertinent Imaging today Plain films and CT images have been personally visualized and interpreted; radiology reports have been reviewed. Decision making incorporated into the Impression / Recommendations.  MR BRAIN WO CONTRAST  Result Date: 06/06/2022 CLINICAL DATA:  Meningitis/CNS infection suspected EXAM: MRI HEAD WITHOUT CONTRAST TECHNIQUE: Multiplanar, multiecho pulse sequences of the brain and surrounding structures were obtained without intravenous contrast. COMPARISON:  05/25/2022 FINDINGS: Brain: Persistent abnormal focal T2 FLAIR hyperintensity. This has progressed since the prior study. Ventricle caliber is mildly increased and could reflect communicating hydrocephalus. There are patchy foci of cortical/subcortical diffusion hyperintensity particularly in the left parietal, posterior temporal, and occipital lobes. Patchy foci of T2 hyperintensity  in the supratentorial white matter may reflect chronic microvascular ischemic changes. Left vertex dural-based lesion on the prior study is likely similar though not as well evaluated on this noncontrast study. No mass effect.  No extra-axial collection. Vascular: Major vessel flow voids at the skull base are preserved. Skull and upper cervical spine: Normal marrow signal is preserved. Sinuses/Orbits: Minor mucosal thickening.  Orbits are unremarkable. Other: Sella is unremarkable. Nonspecific mastoid  effusions. Retained secretions in the included pharynx. IMPRESSION: Persistent findings of meningitis. Extent of abnormal sulcal signal has progressed. Mildly increase in caliber of ventricles may reflect communicating hydrocephalus. Patchy foci of abnormal cortical/subcortical diffusion may reflect acute ischemia, seizure related changes, and/or encephalitis. Electronically Signed   By: Macy Mis M.D.   On: 06/06/2022 15:16     I spent 52  minutes for this patient encounter including review of prior medical records, coordination of care with primary/other specialist with greater than 50% of time being face to face/counseling and discussing diagnostics/treatment plan with the patient/family.  Electronically signed by:   Rosiland Oz, MD Infectious Disease Physician [facility] Infectious Disease Pager: [phone]

## 2022-06-08 NOTE — Progress Notes (Signed)
Subjective: No acute events overnight.  Daughter at bedside.  Family meeting at 3 PM to discuss goals of care  ROS: Unable to obtain due to poor mental status  Examination  Vital signs in last 24 hours: Temp:  [96.6 F (35.9 C)-98.8 F (37.1 C)] 98.1 F (36.7 C) (08/09 0900) Pulse Rate:  [60-109] 92 (08/09 1015) Resp:  [23-33] 23 (08/09 1015) BP: (95-151)/(48-66) 130/54 (08/09 1015) SpO2:  [96 %-100 %] 97 % (08/09 1015) FiO2 (%):  [40 %] 40 % (08/09 0745) Weight:  [81.6 kg] 81.6 kg (08/09 0500)  General: lying in bed, NAD Neuro:  comatose, pupils 3 equal and reactive to light, subtle corneal reflex present, initiating breaths on pressor support, does not withdraw to noxious stimuli in all extremities  Basic Metabolic Panel: Recent Labs  Lab 06/04/22 0208 06/04/22 1600 06/05/22 0402 06/05/22 1606 06/06/22 0431 06/07/22 0350 06/07/22 1704 06/08/22 0342  NA 137   < > 136 136 135 136 136 137  K 5.4*   < > 4.8 4.3 3.4* 5.4* 5.6* 3.6  CL 105   < > 103 103 102 102 102 100  CO2 26   < > '25 25 26 '$ 21* 23 24  GLUCOSE 156*   < > 180* 159* 149* 251* 250* 115*  BUN 45*   < > 50* 47* 43* 104* 97* 66*  CREATININE 1.10*   < > 1.01* 0.95 0.92 2.00* 1.84* 1.27*  CALCIUM 8.0*   < > 7.7* 7.8* 7.8* 8.1* 7.7* 7.7*  MG 2.6*  --  2.5*  --  2.4 2.7*  --  2.4  PHOS 3.7   < > 4.3 4.4 3.6 10.2* 10.1* 5.0*   < > = values in this interval not displayed.    CBC: Recent Labs  Lab 06/03/22 0415 06/04/22 0208 06/05/22 0402 06/06/22 0022 06/06/22 0431 06/07/22 0350 06/08/22 0342  WBC 61.3* 36.3* 30.3* 20.8* 18.8* 16.3* 12.9*  NEUTROABS 57.6* 32.6* 27.6* 18.5* 16.9*  --   --   HGB 9.9* 9.2* 8.6* 8.4* 8.4* 8.1* 7.4*  HCT 31.4* 29.0* 27.0* 27.0* 27.0* 25.9* 23.7*  MCV 78.7* 79.2* 78.7* 80.1 79.6* 80.9 80.6  PLT 324 211 189 188 168 161 169     Coagulation Studies: No results for input(s): "LABPROT", "INR" in the last 72 hours.  Imaging No new brain imaging overnight  ASSESSMENT AND  PLAN: 66 year old female with suspected viral meningo-encephalitis (had hand-foot-and-mouth disease prior to presentation) now with status epilepticus.   Focal convulsive status epilepticus, resolved Provoked seizures Acute encephalopathy, infectious Entero-Viral meningo-encephalitis Left meningioma Acute ischemic stroke, incidental Obstructive hydrocephalus Immunocompromised  History of small lymphocytic like B-cell non-Hodgkin lymphoma -Patient had exposure to hand-foot-and-mouth disease and subsequently enterovirus encephalitis.   -Status epilepticus likely due to viral encephalitis, meningioma (meningiomas do not commonly cause status epilepticus but the location of this meningioma is concerning).   -Ischemic strokes and likely due to encephalitis   Recommendations -Continue Keppra 1 g twice daily, Vimpat 200 mg twice daily, perampanel 4 mg daily and phenobarbital 65 mg twice daily -EEG appears improved after patient gets all her medications but then gradually the LPD+ reappear.  Therefore we will try to space AEDs in a way that she gets medications at different intervals.  Appreciate pharmacy's assistance with this -If seizures recur, can increase Keppra (if renal function improving) or increase phenobarbital -Continue LTM EEG to look for intermittent seizures -Discussed MRI findings with daughter at bedside.  Patient has been off sedation since 06/03/2022 (  evening)  with minimal change in mental status (has cranial nerve reflexes but continues to be comatose, not withdrawing in all extremities).  I discussed with daughter that her current clinical state is most likely due to meningitis/encephalitis and subsequent status epilepticus.  We can repeat MRI brain next week.  However, even MRIs are not always able to show the extent of brain injury that can happen at cellular and molecular level. Therefore, clinically if she remains comatose by 06/15/2022, then this would most likely signify  significant irreversible neurologic brain injury with minimal chances of meaningful recovery. -Continue seizure precautions   I have spent a total of 40 minutes with the patient reviewing hospital notes,  test results, labs and examining the patient as well as establishing an assessment and plan.  > 50% of time was spent in direct patient care.   Zeb Comfort Epilepsy [facility] For questions after 5pm please refer to AMION to reach the Neurologist on call

## 2022-06-08 NOTE — Progress Notes (Addendum)
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   Discharge summary reviewed: She initially presented to OSH on 05/20/22 for left upper extremity tremors. She reported recent hand foot any mouth disease contracted from her grandchild that left her weak and tired prior to this admission. Neurology work-up including MRI, EEG negative. MRI/MRA neg for acute infarct, 13 mm focus in left frontal lobe representing artifact vs small calcified meningioma, chronic small vessel ischemic changes, cerebral atrophy, high-grade stenosis in bilateral ICA, focal stenosis in left ACA and intracranial right vertebral artery. MRI of C-spine showing C4-5 cord compression from advanced disc degeneration with posterior endplate ridging.  Showing left foraminal impingement at the same level.  Also showing C5-6 and C6-7 ADCF with solid arthrodesis.  Neurologist at [facility] had recommended neurosurgical evaluation.  She developed fevers and transferred to ICU concerning for meningitis and started on Vanc, rocephin, ampicillin and acyclovir. LP was performed on 7/24 with WBC 55 RBC 2. Pending herpes, enterovirus, ehrlichia. CT A/P showed bilateral ground glass and lung opacities concerning for multifocal pneumonia.   PCCM consulted for decreased responsiveness and hypoxemia on NRB. On exam obtunded and only able to say name. O2 sats in the 70s on NRB. Transferred to the ICU for emergent intubation   Pertinent  Medical History  As above  Significant Hospital Events: Including  procedures, antibiotic start and stop dates in addition to other pertinent events   7/24 Admitted to [facility] from [place]. Transferred to PCCM when found in respiratory failure 7/25 intubated, bronched, art line placed, PICC placed.  Neuro and ID consulted. Started decadron  7/26 decadron dc/d 7/27 felt most consistent with viral meningoencephalitis  7/29-7/30 sudden onset multiorgan failure; given tpa with improvement 8/4 trach 8/6 trach bleeding  Interim History / Subjective:  No events, remains comatose. Back on CRRT.  Objective   Blood pressure (!) 112/49, pulse 76, temperature (!) 97.3 F (36.3 C), temperature source Rectal, resp. rate (!) 28, height 5' 5.98" (1.676 m), weight 81.6 kg, SpO2 99 %. CVP:  [4 mmHg-11 mmHg] 5 mmHg  Vent Mode: PRVC FiO2 (%):  [40 %] 40 % Set Rate:  [28 bmp] 28 bmp Vt Set:  [480 mL] 480 mL PEEP:  [8 cmH20] 8 cmH20 Pressure Support:  [8 cmH20-14 cmH20] 8 cmH20 Plateau Pressure:  [21 cmH20-22 cmH20] 21 cmH20   Intake/Output Summary (Last 24 hours) at 06/08/2022 0703 Last data filed at 06/08/2022 0600 Gross per 24 hour  Intake 2336.53 ml  Output 3526 ml  Net -1189.47 ml    Filed Weights   06/02/22 0500 06/03/22 0340 06/08/22 0500  Weight: 88.9 kg 87.4 kg 81.6 kg   Physical Exam: Pupils slightly dilated but equal and reactive No response to noxious stimuli Triggers vent Weak cough/gag Foley with brown urine  Labs improved with CRRT H/H down slightly  Resolved Hospital Problem list   Metabolic acidosis Hypertriglycerides being off propofol infusion Severe metabolic acidosis UTI  Assessment & Plan:  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  Preceding hand/foot mouth.  CSF enterovirus + Critical cervical  myopathy s/p ACDF C5-C7- makes repeat LP dangerous. She remains severely encephalopathic, not waking up; recovery could take weeks to months if it occurs.  Repeat MRI showing some progression which is  concerning. - Continue to hold sedation - Supportive care - Droplet precautions to continue indefinitely - On steroid taper, ?reason: refractory SE After multidisciplinary discussion yesterday, persistent comatose state off sedation, progressive changes on MRI, we are going to do a time-limited trial of further aggressive care: if by 06/14/22 no improvement in neurological function will recommend transitioning to comfort.  Dysphagia- will talk with family, may hold off on PEG placement.  Acute hypoxemic/hypercapnic respiratory failure 2/2 multilobar PNA s/p tracheostomy Leukamoid reaction vs. HCAP- improving - PS trial as tolerated - Meropenem to complete 5 days (ended 8/8)  Tracheostomy site bleeding- resolved  Small lymphocytic B cell non-hodgkin lymphoma Hypogammaglobinemia- Patient is on Rituxan as an outpatient; last dose in June - Repeat IgG WNL 8/7  Status epilepticus- resolved but requiring multiple AEDs -Continue Keppra, Vimpat and phenobarbital - AEDs, cEEG per neuro  DM2 with severe hyperglycemia  - Continue increased basal levemir q12h: NPO at present may have to hold off further  Sudden onset shock, acute renal failure, acute liver failure, RV dilation- s/p tpa with good improvement in hemodynamics - Continue midodrine - Heparin drip for now - CRRT per nephrology, eventual trial off  Best Practice (right click and "Reselect all SmartList Selections" daily)   Diet/type: Tube feeds DVT prophylaxis: heparin gtt GI prophylaxis: PPI Lines: PICC  Foley: recurrent retention due to neurological injury, continue foley for now Code Status:  full code Last date of multidisciplinary goals of care discussion: 8/8, 1 week trial  Total critical care time: 32 minutes  Erskine Emery MD PCCM

## 2022-06-09 DIAGNOSIS — R4182 Altered mental status, unspecified: Secondary | ICD-10-CM | POA: Diagnosis not present

## 2022-06-09 DIAGNOSIS — G40901 Epilepsy, unspecified, not intractable, with status epilepticus: Secondary | ICD-10-CM | POA: Diagnosis not present

## 2022-06-09 DIAGNOSIS — G039 Meningitis, unspecified: Secondary | ICD-10-CM | POA: Diagnosis not present

## 2022-06-09 LAB — RENAL FUNCTION PANEL
Albumin: 2.2 g/dL — ABNORMAL LOW (ref 3.5–5.0)
Albumin: 2.3 g/dL — ABNORMAL LOW (ref 3.5–5.0)
Anion gap: 9 (ref 5–15)
Anion gap: 8 (ref 5–15)
BUN: 37 mg/dL — ABNORMAL HIGH (ref 8–23)
BUN: 32 mg/dL — ABNORMAL HIGH (ref 8–23)
CO2: 26 mmol/L (ref 22–32)
CO2: 25 mmol/L (ref 22–32)
Calcium: 7.8 mg/dL — ABNORMAL LOW (ref 8.9–10.3)
Calcium: 7.9 mg/dL — ABNORMAL LOW (ref 8.9–10.3)
Chloride: 103 mmol/L (ref 98–111)
Chloride: 103 mmol/L (ref 98–111)
Creatinine, Ser: 0.91 mg/dL (ref 0.44–1.00)
Creatinine, Ser: 0.78 mg/dL (ref 0.44–1.00)
GFR, Estimated: >60 mL/min (ref >60)
GFR, Estimated: >60 mL/min (ref >60)
Glucose, Bld: 67 mg/dL — ABNORMAL LOW (ref 70–99)
Glucose, Bld: 221 mg/dL — ABNORMAL HIGH (ref 70–99)
Phosphorus: 3.7 mg/dL (ref 2.5–4.6)
Phosphorus: 4.3 mg/dL (ref 2.5–4.6)
Potassium: 3 mmol/L — ABNORMAL LOW (ref 3.5–5.1)
Potassium: 4.3 mmol/L (ref 3.5–5.1)
Sodium: 138 mmol/L (ref 135–145)
Sodium: 136 mmol/L (ref 135–145)

## 2022-06-09 LAB — HEPARIN LEVEL (UNFRACTIONATED)
Heparin Unfractionated: 0.22 IU/mL — ABNORMAL LOW (ref 0.30–0.70)
Heparin Unfractionated: 0.41 IU/mL (ref 0.30–0.70)

## 2022-06-09 LAB — GLUCOSE, CAPILLARY
Glucose-Capillary: 126 mg/dL — ABNORMAL HIGH (ref 70–99)
Glucose-Capillary: 64 mg/dL — ABNORMAL LOW (ref 70–99)
Glucose-Capillary: 80 mg/dL (ref 70–99)
Glucose-Capillary: 154 mg/dL — ABNORMAL HIGH (ref 70–99)
Glucose-Capillary: 231 mg/dL — ABNORMAL HIGH (ref 70–99)
Glucose-Capillary: 237 mg/dL — ABNORMAL HIGH (ref 70–99)
Glucose-Capillary: 229 mg/dL — ABNORMAL HIGH (ref 70–99)

## 2022-06-09 LAB — CBC
HCT: 24.2 % — ABNORMAL LOW (ref 36.0–46.0)
Hemoglobin: 7.7 g/dL — ABNORMAL LOW (ref 12.0–15.0)
MCH: 25.7 pg — ABNORMAL LOW (ref 26.0–34.0)
MCHC: 31.8 g/dL (ref 30.0–36.0)
MCV: 80.7 fL (ref 80.0–100.0)
Platelets: 178 10*3/uL (ref 150–400)
RBC: 3 MIL/uL — ABNORMAL LOW (ref 3.87–5.11)
RDW: 23.4 % — ABNORMAL HIGH (ref 11.5–15.5)
WBC: 10.2 10*3/uL (ref 4.0–10.5)
nRBC: 0.2 % (ref 0.0–0.2)

## 2022-06-09 LAB — PROTIME-INR
INR: 1.1 (ref 0.8–1.2)
Prothrombin Time: 14 seconds (ref 11.4–15.2)

## 2022-06-09 LAB — MAGNESIUM: Magnesium: 2.5 mg/dL — ABNORMAL HIGH (ref 1.7–2.4)

## 2022-06-09 MED ORDER — PRISMASOL BGK 4/2.5 32-4-2.5 MEQ/L REPLACEMENT SOLN: Status: DC

## 2022-06-09 MED ORDER — HEPARIN SODIUM (PORCINE) 1000 UNIT/ML DIALYSIS
1000.0000 [IU] | INTRAMUSCULAR | Status: DC | PRN
  Filled 2022-06-09: qty 6

## 2022-06-09 MED ORDER — PRISMASOL BGK 4/2.5 32-4-2.5 MEQ/L EC SOLN: Status: DC

## 2022-06-09 MED ORDER — CLOBAZAM 10 MG PO TABS
10.0000 mg | ORAL_TABLET | Freq: Two times a day (BID) | ORAL | Status: DC
  Administered 2022-06-09 – 2022-06-10 (×3): 10 mg
  Filled 2022-06-09 (×3): qty 1

## 2022-06-09 MED ORDER — INSULIN DETEMIR 100 UNIT/ML ~~LOC~~ SOLN
25.0000 [IU] | Freq: Two times a day (BID) | SUBCUTANEOUS | Status: DC
  Administered 2022-06-09 – 2022-06-15 (×13): 25 [IU] via SUBCUTANEOUS
  Filled 2022-06-09 (×15): qty 0.25

## 2022-06-09 MED ORDER — WARFARIN SODIUM 5 MG PO TABS: 5.0000 mg | ORAL_TABLET | Freq: Once | ORAL | Status: DC

## 2022-06-09 MED ORDER — WARFARIN - PHARMACIST DOSING INPATIENT: Freq: Every day | Status: DC

## 2022-06-09 MED ORDER — WARFARIN SODIUM 5 MG PO TABS
5.0000 mg | ORAL_TABLET | Freq: Once | ORAL | Status: AC
  Administered 2022-06-09: 5 mg via ORAL
  Filled 2022-06-09: qty 1

## 2022-06-09 MED ORDER — DEXTROSE 50 % IV SOLN
12.5000 g | INTRAVENOUS | Status: AC
  Administered 2022-06-09: 12.5 g via INTRAVENOUS

## 2022-06-09 NOTE — TOC Progression Note (Signed)
Transition of Care [facility] - Progression Note    Patient Details  Name: Valerie Cline MRN: [id number] Date of Birth: Nov 25, 1955  Transition of Care [facility]) CM/SW Contact  Graves-Bigelow, Ocie Cornfield, RN Phone Number: 06/09/2022, 11:49 AM  Clinical Narrative:  Patient was discussed in progression rounds. Patient remains on CRRT until 06-14-22. Case Manager discussed patients disposition with the  provider earlier this week- plan is for agressive treatment and if by 06-14-22 and no improvement plan will be to discuss comfort care. Case Manager continuing to follow the patient.   Expected Discharge Plan:  (TBD) Barriers to Discharge: Continued Medical Work up  Expected Discharge Plan and Services Expected Discharge Plan:  (TBD)   Discharge Planning Services: CM Consult   Living arrangements for the past 2 months: Single Family Home                   DME Agency: NA   Readmission Risk Interventions     No data to display

## 2022-06-09 NOTE — Progress Notes (Signed)
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   Discharge summary reviewed: She initially presented to OSH on 05/20/22 for left upper extremity tremors. She reported recent hand foot any mouth disease contracted from her grandchild that left her weak and tired prior to this admission. Neurology work-up including MRI, EEG negative. MRI/MRA neg for acute infarct, 13 mm focus in left frontal lobe representing artifact vs small calcified meningioma, chronic small vessel ischemic changes, cerebral atrophy, high-grade stenosis in bilateral ICA, focal stenosis in left ACA and intracranial right vertebral artery. MRI of C-spine showing C4-5 cord compression from advanced disc degeneration with posterior endplate ridging.  Showing left foraminal impingement at the same level.  Also showing C5-6 and C6-7 ADCF with solid arthrodesis.  Neurologist at [facility] had recommended neurosurgical evaluation.  She developed fevers and transferred to ICU concerning for meningitis and started on Vanc, rocephin, ampicillin and acyclovir. LP was performed on 7/24 with WBC 55 RBC 2. Pending herpes, enterovirus, ehrlichia. CT A/P showed bilateral ground glass and lung opacities concerning for multifocal pneumonia.   PCCM consulted for decreased responsiveness and hypoxemia on NRB. On exam obtunded and only able to say name. O2 sats in the 70s on NRB. Transferred to the ICU for emergent intubation   Pertinent  Medical History  As above  Significant Hospital Events: Including procedures,  antibiotic start and stop dates in addition to other pertinent events   7/24 Admitted to [facility] from [place]. Transferred to PCCM when found in respiratory failure 7/25 intubated, bronched, art line placed, PICC placed.  Neuro and ID consulted. Started decadron  7/26 decadron dc/d 7/27 felt most consistent with viral meningoencephalitis  7/29-7/30 sudden onset multiorgan failure; given tpa with improvement 8/4 trach 8/6 trach bleeding  Interim History / Subjective:  No events. PEG to be used shortly. Needed some extra glucose x 1 overnight  Objective   Blood pressure (!) 108/50, pulse 84, temperature 97.9 F (36.6 C), temperature source Bladder, resp. rate (!) 28, height 5' 5.98" (1.676 m), weight 79.3 kg, SpO2 99 %. CVP:  [2 mmHg-15 mmHg] 10 mmHg  Vent Mode: PRVC FiO2 (%):  [40 %-100 %] 40 % Set Rate:  [28 bmp] 28 bmp Vt Set:  [480 mL] 480 mL PEEP:  [5 cmH20-8 cmH20] 8 cmH20 Plateau Pressure:  [20 cmH20-23 cmH20] 22 cmH20   Intake/Output Summary (Last 24 hours) at 06/09/2022 0857 Last data filed at 06/09/2022 0800 Gross per 24 hour  Intake 1387.26 ml  Output 4536 ml  Net -3148.74 ml    Filed Weights   06/03/22 0340 06/08/22 0500 06/09/22 0500  Weight: 87.4 kg 81.6 kg 79.3 kg   Physical Exam: Pupils equal Opens her eyes to repeated stimuli for me this am Also having some tongue movement/swallowing Not triggering vent for some reason Remains on cEEG Minimal edema Lungs clear Fecal management system and foley in place PEG site wrapped  Labs stable off CRRT WBC improved H/H stable  Resolved Hospital Problem list   Metabolic acidosis Hypertriglycerides being off propofol infusion Severe metabolic acidosis UTI  Assessment & Plan:  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  Preceding hand/foot mouth.  CSF enterovirus + Critical cervical myopathy s/p ACDF C5-C7- makes repeat LP dangerous. She remains severely encephalopathic, not waking  up; recovery could take weeks to months if it occurs.  Repeat MRI showing some progression which is concerning. - Continue to hold sedation - Supportive care - Droplet precautions to continue indefinitely - On steroid taper, ?reason: refractory SE- continue as ordered After multidisciplinary discussion 06/07/22, persistent comatose state off sedation, progressive changes on MRI, we are going to do a time-limited trial of further aggressive care: if by 06/14/22 no improvement in neurological function will recommend transitioning to comfort.  Dysphagia- s/p PEG, to use today  Acute hypoxemic/hypercapnic respiratory failure 2/2 multilobar PNA s/p tracheostomy Leukamoid reaction vs. HCAP- improving - PS trial as tolerated - Meropenem to complete 5 days (ended 8/8)  Tracheostomy site bleeding- resolved  Small lymphocytic B cell non-hodgkin lymphoma Hypogammaglobinemia- Patient is on Rituxan as an outpatient; last dose in June - Repeat IgG WNL 8/7, nothing further to do here  Status epilepticus- resolved but requiring multiple AEDs -Continue Keppra, Vimpat and phenobarbital - Will try to transition off phenobarbital to allow eliquis use - AEDs, cEEG per neuro  DM2 with hypoglycemia - To restart TF today, likely can restart levemir tonight, continue SSI  Sudden onset shock, acute renal failure, acute liver failure, RV dilation presumed massive PE (7/29-7/30)- s/p tpa with good improvement in hemodynamics; now off presors - Continue midodrine - Heparin drip for now; wait for phenobarbital washout then switch to eliquis - CRRT per nephrology, eventual transition to [facility]  Best Practice (right click and "Reselect all SmartList Selections" daily)   Diet/type: Tube feeds DVT prophylaxis: heparin gtt GI prophylaxis: PPI Lines: PICC  Foley: recurrent retention due to neurological injury, continue foley for now Code Status:  full code Last date of multidisciplinary goals of care discussion: 8/8,  1 week trial as above  Total critical care time: 34 minutes  Erskine Emery MD PCCM

## 2022-06-09 NOTE — Progress Notes (Signed)
ANTICOAGULATION CONSULT NOTE  Pharmacy Consult for heparin Indication: pulmonary embolus  Allergies  Allergen Reactions   Canagliflozin Other (See Comments)    recurrent vaginitis   Glucophage [Metformin] Other (See Comments)    GI intolerance   Niaspan [Niacin] Other (See Comments)    flushing    Patient Measurements: Height: 5' 5.98" (167.6 cm) Weight: 81.6 kg (179 lb 14.3 oz) IBW/kg (Calculated) : 59.25 Heparin Dosing Weight: 79kg  Vital Signs: Temp: 98.4 F (36.9 C) (08/10 0000) Temp Source: Rectal (08/10 0000) BP: 138/56 (08/10 0015) Pulse Rate: 69 (08/10 0015)  Labs: Recent Labs    06/06/22 0431 06/07/22 0350 06/07/22 1700 06/07/22 1704 06/08/22 0342 06/08/22 1519 06/08/22 2317  HGB 8.4* 8.1*  --   --  7.4*  --   --   HCT 27.0* 25.9*  --   --  23.7*  --   --   PLT 168 161  --   --  169  --   --   HEPARINUNFRC <0.10*  --  0.52  --  0.41  --  0.32  CREATININE 0.92 2.00*  --  1.84* 1.27* 1.16*  --      Estimated Creatinine Clearance: 50.7 mL/min (A) (by C-G formula based on SCr of 1.16 mg/dL (H)).   Assessment: 52 yoF admitted with meningitis from OSH. Pt with ongoing fevers and worsening clinical status 7/29, RV severely down on beside ECHO so concerns for acute PE. Pt received alteplase on 7/30 for suspected saddle PE.    Heparin was on hold given bleeding at trach on 8/7. Trach bleeding improved today - okay per MD to restart and would like with bolus.  Hgb 8.1, plt 161.   Per IR plans, okay to resume heparin 4 hours post sheath removal. Previous rate was therapeutic.  8/10 AM update:  Heparin level remains therapeutic   Goal of Therapy:  Heparin level 0.3-0.5 units/mL Monitor platelets by anticoagulation protocol: Yes   Plan:  Cont heparin 1050 units/hr Daily CBC/heparin level Monitor closely for bleeding  Narda Bonds, PharmD, BCPS Clinical Pharmacist Phone: [phone]

## 2022-06-09 NOTE — Procedures (Signed)
MRN: [id number]  Epilepsy Attending: Lora Havens  Referring Physician/Provider: Greta Doom, MD   Duration: 06/08/2022 1014 to 06/09/2022 1014   Patient history: 66yo F with ams. EEG to evaluate for seizure   Level of alertness: comatose   AEDs during EEG study: Keppra, Vimpat, perampanel, Phenobarb   Technical aspects: This EEG study was done with scalp electrodes positioned according to the 10-20 International system of electrode placement. Electrical activity was acquired at a sampling rate of '500Hz'$  and reviewed with a high frequency filter of '70Hz'$  and a low frequency filter of '1Hz'$ . EEG data were recorded continuously and digitally stored.    Description: EEG showed near continuous polymorphic sharply contoured 3 to 7 Hz theta-delta slowing. Polyspikes were also noted in left posterior quadrant which at times appeared quasiperiodic  at 1 Hz and consistent with lateralized periodic discharges with overriding fast activity ( LPD+F)     ABNORMALITY -Lateralized periodic discharges with overriding fast activity ( LPD+F), left hemisphere, maximal left posterior quadrant -Continuous slow, generalized   IMPRESSION: This study showed evidence of epileptogenicity in left hemisphere, maximal left posterior quadrant with high risk for seizure recurrence.  Additionally there was severe diffuse encephalopathy. No definite seizures were seen during this study.   Felder Lebeda [name]

## 2022-06-09 NOTE — Progress Notes (Addendum)
ANTICOAGULATION CONSULT NOTE  Pharmacy Consult for heparin Indication: pulmonary embolus  Allergies  Allergen Reactions   Canagliflozin Other (See Comments)    recurrent vaginitis   Glucophage [Metformin] Other (See Comments)    GI intolerance   Niaspan [Niacin] Other (See Comments)    flushing    Patient Measurements: Height: 5' 5.98" (167.6 cm) Weight: 79.3 kg (174 lb 13.2 oz) IBW/kg (Calculated) : 59.25 Heparin Dosing Weight: 79kg  Vital Signs: Temp: 97.7 F (36.5 C) (08/10 1200) Temp Source: Rectal (08/10 1200) BP: 119/52 (08/10 1200) Pulse Rate: 108 (08/10 1200)  Labs: Recent Labs    06/07/22 0350 06/07/22 1700 06/08/22 0342 06/08/22 1519 06/08/22 2317 06/09/22 0329  HGB 8.1*  --  7.4*  --   --  7.7*  HCT 25.9*  --  23.7*  --   --  24.2*  PLT 161  --  169  --   --  178  HEPARINUNFRC  --    < > 0.41  --  0.32 0.22*  CREATININE 2.00*   < > 1.27* 1.16*  --  0.91   < > = values in this interval not displayed.     Estimated Creatinine Clearance: 63.7 mL/min (by C-G formula based on SCr of 0.91 mg/dL).   Assessment: 32 yoF admitted with meningitis from OSH. Pt with ongoing fevers and worsening clinical status 7/29, RV severely down on beside ECHO so concerns for acute PE. Pt received alteplase on 7/30 for suspected saddle PE.    Heparin level subtherapeutic at 0.22. Hg remains low, but plts stable. No bleeding noted. Discussed with CCM and will continue heparin for now. Eliiquis will require at least 7 days washout of phenobarbital. Warfarin may be the best option, but will continue to follow plans for anticoag.  Goal of Therapy:  Heparin level 0.3-0.5 units/mL Monitor platelets by anticoagulation protocol: Yes   Plan:  Increase heparin to 1200 units/hr  No bolus with recent procedure Obtain heparin level in 8 hours  Daily heparin level and CBC while on heparin Monitor for s/sx bleeding   Thank you for allowing pharmacy to participate in this patient's  care.  Reatha Harps, PharmD PGY2 Pharmacy Resident 06/09/2022 12:16 PM Check [email or web address] for unit specific pharmacy number

## 2022-06-09 NOTE — Progress Notes (Signed)
Patient ID: Valerie Cline, female   DOB: 05-31-1956, 66 y.o.   MRN: [id number] S:No significant events overnight O:BP (!) 155/71 (BP Location: Right Arm)   Pulse 85   Temp (!) 97.2 F (36.2 C) (Bladder)   Resp (!) 23   Ht 5' 5.98" (1.676 m)   Wt 79.3 kg   SpO2 100%   BMI 28.23 kg/m   Intake/Output Summary (Last 24 hours) at 06/09/2022 1019 Last data filed at 06/09/2022 1000 Gross per 24 hour  Intake 1289.09 ml  Output 4466 ml  Net -3176.91 ml   Intake/Output: I/O last 3 completed shifts: In: 2089 [I.V.:843.8; Other:200; NG/GT:490; IV Piggyback:555.2] Out: 6801 [Urine:165; Drains:400; Stool:530]  Intake/Output this shift:  Total I/O In: 466 [I.V.:85.9; Other:200; NG/GT:55.3; IV Piggyback:124.8] Out: 542 [Urine:10; Drains:150] Weight change: -2.3 kg Gen: unresponsive, on vent via trach CVS:RRR Resp: bilateral rhonchi Abd:+BS, soft, NT/ND Ext:1+ BLE edema  Recent Labs  Lab 06/05/22 1606 06/06/22 0431 06/07/22 0350 06/07/22 1704 06/08/22 0342 06/08/22 1519 06/09/22 0329  NA 136 135 136 136 137 137 138  K 4.3 3.4* 5.4* 5.6* 3.6 4.0 3.0*  CL 103 102 102 102 100 102 103  CO2 25 26 21* '23 24 24 26  '$ GLUCOSE 159* 149* 251* 250* 115* 101* 67*  BUN 47* 43* 104* 97* 66* 56* 37*  CREATININE 0.95 0.92 2.00* 1.84* 1.27* 1.16* 0.91  ALBUMIN 2.3* 2.2* 2.2* 2.2* 2.1* 2.3* 2.2*  CALCIUM 7.8* 7.8* 8.1* 7.7* 7.7* 7.8* 7.8*  PHOS 4.4 3.6 10.2* 10.1* 5.0* 6.1* 3.7   Liver Function Tests: Recent Labs  Lab 06/08/22 0342 06/08/22 1519 06/09/22 0329  ALBUMIN 2.1* 2.3* 2.2*   No results for input(s): "LIPASE", "AMYLASE" in the last 168 hours. No results for input(s): "AMMONIA" in the last 168 hours. CBC: Recent Labs  Lab 06/05/22 0402 06/06/22 0022 06/06/22 0431 06/07/22 0350 06/08/22 0342 06/09/22 0329  WBC 30.3* 20.8* 18.8* 16.3* 12.9* 10.2  NEUTROABS 27.6* 18.5* 16.9*  --   --   --   HGB 8.6* 8.4* 8.4* 8.1* 7.4* 7.7*  HCT 27.0* 27.0* 27.0* 25.9* 23.7* 24.2*  MCV 78.7*  80.1 79.6* 80.9 80.6 80.7  PLT 189 188 168 161 169 178   Cardiac Enzymes: No results for input(s): "CKTOTAL", "CKMB", "CKMBINDEX", "TROPONINI" in the last 168 hours. CBG: Recent Labs  Lab 06/08/22 1956 06/08/22 2315 06/09/22 0331 06/09/22 0407 06/09/22 0754  GLUCAP 115* 91 64* 126* 80    Iron Studies: No results for input(s): "IRON", "TIBC", "TRANSFERRIN", "FERRITIN" in the last 72 hours. Studies/Results: IR GASTROSTOMY TUBE MOD SED  Result Date: 06/08/2022 INDICATION: Meningitis, encephalopathy EXAM: FLUOROSCOPIC 20 FRENCH PULL-THROUGH GASTROSTOMY Date:  06/08/2022 06/08/2022 11:29 am Radiologist:  M. Daryll Brod, MD Guidance:  Fluoroscopic MEDICATIONS: Patient is already receiving IV antibiotics as an inpatient glucagon 0.5 mg IV ANESTHESIA/SEDATION: Moderate Sedation Time:  None. The patient was continuously monitored during the procedure by the interventional radiology nurse under my direct supervision. CONTRAST:  20 cc omni 300-administered into the gastric lumen. FLUOROSCOPY: Fluoroscopy Time: 1 minutes 42 seconds (52 mGy). COMPLICATIONS: None immediate. PROCEDURE: Informed consent was obtained from the patient following explanation of the procedure, risks, benefits and alternatives. The patient understands, agrees and consents for the procedure. All questions were addressed. A time out was performed. Maximal barrier sterile technique utilized including caps, mask, sterile gowns, sterile gloves, large sterile drape, hand hygiene, and betadine prep. The left upper quadrant was sterilely prepped and draped. An oral gastric catheter was inserted  into the stomach under fluoroscopy. The existing nasogastric feeding tube was removed. Air was injected into the stomach for insufflation and visualization under fluoroscopy. The air distended stomach was confirmed beneath the anterior abdominal wall in the frontal and lateral projections. Under sterile conditions and local anesthesia, a 28 gauge trocar  needle was utilized to access the stomach percutaneously beneath the left subcostal margin. Needle position was confirmed within the stomach under biplane fluoroscopy. Contrast injection confirmed position also. A single T tack was deployed for gastropexy. Over an Amplatz guide wire, a 9-French sheath was inserted into the stomach. A snare device was utilized to capture the oral gastric catheter. The snare device was pulled retrograde from the stomach up the esophagus and out the oropharynx. The 20-French pull-through gastrostomy was connected to the snare device and pulled antegrade through the oropharynx down the esophagus into the stomach and then through the percutaneous tract external to the patient. The gastrostomy was assembled externally. Contrast injection confirms position in the stomach. Images were obtained for documentation. The patient tolerated procedure well. No immediate complication. IMPRESSION: Fluoroscopic insertion of a 20-French "pull-through" gastrostomy. Electronically Signed   By: Jerilynn Mages.  Shick M.D.   On: 06/08/2022 11:38    Chlorhexidine Gluconate Cloth  6 each Topical Daily   cloBAZam  10 mg Per Tube BID   docusate  100 mg Per Tube BID   fentaNYL (SUBLIMAZE) injection  200 mcg Intravenous Once   insulin aspart  0-20 Units Subcutaneous Q4H   insulin detemir  25 Units Subcutaneous BID   midazolam  5 mg Intravenous Once   midodrine  10 mg Per Tube TID WC   multivitamin  1 tablet Per Tube QHS   mouth rinse  15 mL Mouth Rinse Q2H   pantoprazole sodium  40 mg Per Tube Daily   perampanel  4 mg Per Tube Q1200   polyethylene glycol  17 g Per Tube Daily   predniSONE  40 mg Per Tube Q breakfast   Followed by   Derrill Memo ON 06/11/2022] predniSONE  20 mg Per Tube Q breakfast   sodium chloride flush  10-40 mL Intracatheter Q12H    BMET    Component Value Date/Time   NA 138 06/09/2022 0329   NA 141 11/30/2021 1643   K 3.0 (L) 06/09/2022 0329   CL 103 06/09/2022 0329   CO2 26 06/09/2022  0329   GLUCOSE 67 (L) 06/09/2022 0329   BUN 37 (H) 06/09/2022 0329   BUN 15 11/30/2021 1643   CREATININE 0.91 06/09/2022 0329   CREATININE 0.76 04/05/2022 0830   CALCIUM 7.8 (L) 06/09/2022 0329   GFRNONAA >60 06/09/2022 0329   GFRNONAA >60 04/05/2022 0830   GFRAA >60 07/09/2020 0743   CBC    Component Value Date/Time   WBC 10.2 06/09/2022 0329   RBC 3.00 (L) 06/09/2022 0329   HGB 7.7 (L) 06/09/2022 0329   HGB 12.9 04/05/2022 0830   HCT 24.2 (L) 06/09/2022 0329   PLT 178 06/09/2022 0329   PLT 222 04/05/2022 0830   MCV 80.7 06/09/2022 0329   MCH 25.7 (L) 06/09/2022 0329   MCHC 31.8 06/09/2022 0329   RDW 23.4 (H) 06/09/2022 0329   LYMPHSABS 0.3 (L) 06/06/2022 0431   MONOABS 1.0 06/06/2022 0431   EOSABS 0.1 06/06/2022 0431   BASOSABS 0.0 06/06/2022 0431    Assessment/Plan: ARF, oliguric- b/l creat 0.8 on admission. Creat rising now in setting of new onset shock. Pt admitted for viral meningoencephalitis complicated by critical cervical  myopathy and now resp failure w/ multifocal PNA. Pt has lymphoma receiving active chemoRx (rituximab) w/ last dose in June 2023. AKI due to shock which started in last 24 hrs abruptly. CCM suspecting acute PE, s/p TPA and now on IV heparin. K+ up to 6.8 -> CCRT 7/30 w/ all 2K fluids -> 4K now. No signs of renal recovery. Very minimal UOP.    CRRT stopped early 06/06/22, however her K is up to 6 and BUN is up to 104.  Resumed CRRT 06/07/22 and will continue until 06/14/22.  Fluids changed to 4K/2.5Ca due to hypokalemia this morning. If no neurological improvement, agree with recommendation to transition to comfort care after a time-limited trial of aggressive care.     Volume - mod edema on exam, I/O + 3 L since admit. Tolerated UF of 50-141m/hr. Shock - maybe from multilobar PNA + viral menigoencephalitis, as above, on vasopressor support,  H/o lymphoma - getting active chemoRx w/ last dose rituxan in June 2023 AHRF - on vent, multifocal PNA on broad spec  IV abx per CCM Status epilepticus on propofol AMS - in setting of viral meningoencephalitis.  Remains unresponsive despite being off of sedation for over 96 hours and progressive changes seen on MRI.  Neurology and ID following.   Cervical myelopathy  DM2 - on insulin Disposition - remains unresponsive off of sedation for over 96 hours.  After multidisciplinary discussion, decision was to continue with aggressive care for a time limited trial.  If by 06/14/22 she has no improvement, will recommend transitioning to comfort care.  JDonetta Potts MD [facility]

## 2022-06-09 NOTE — Progress Notes (Signed)
vLTM maintenance  All impedances below 10kohms.  No skin breakdown noted aT a1  fp1  fp2 f7  f8  a2

## 2022-06-09 NOTE — Progress Notes (Signed)
ANTICOAGULATION CONSULT NOTE  Pharmacy Consult for heparin Indication: pulmonary embolus  Allergies  Allergen Reactions   Canagliflozin Other (See Comments)    recurrent vaginitis   Glucophage [Metformin] Other (See Comments)    GI intolerance   Niaspan [Niacin] Other (See Comments)    flushing    Patient Measurements: Height: 5' 5.98" (167.6 cm) Weight: 79.3 kg (174 lb 13.2 oz) IBW/kg (Calculated) : 59.25 Heparin Dosing Weight: 79kg  Vital Signs: Temp: 98.2 F (36.8 C) (08/10 2100) Temp Source: Rectal (08/10 2100) BP: 130/51 (08/10 2100) Pulse Rate: 86 (08/10 2100)  Labs: Recent Labs    06/07/22 0350 06/07/22 1700 06/08/22 0342 06/08/22 1519 06/08/22 2317 06/09/22 0329 06/09/22 1649 06/09/22 2045  HGB 8.1*  --  7.4*  --   --  7.7*  --   --   HCT 25.9*  --  23.7*  --   --  24.2*  --   --   PLT 161  --  169  --   --  178  --   --   LABPROT  --   --   --   --   --   --   --  14.0  INR  --   --   --   --   --   --   --  1.1  HEPARINUNFRC  --    < > 0.41  --  0.32 0.22*  --  0.41  CREATININE 2.00*   < > 1.27* 1.16*  --  0.91 0.78  --    < > = values in this interval not displayed.     Estimated Creatinine Clearance: 72.5 mL/min (by C-G formula based on SCr of 0.78 mg/dL).   Assessment: 43 yoF admitted with meningitis from OSH. Pt with ongoing fevers and worsening clinical status 7/29, RV severely down on beside ECHO so concerns for acute PE. Pt received alteplase on 7/30 for suspected saddle PE.    Heparin level subtherapeutic at 0.22. Hg remains low, but plts stable. No bleeding noted. Discussed with CCM and will continue heparin for now. Eliiquis will require at least 7 days washout of phenobarbital. Warfarin may be the best option, but will continue to follow plans for anticoag.  Heparin level came back therapeutic tonight. We will also start coumadin tonight also due to residual interaction between NOAC and phenobarb. INR 1.1.  Goal of Therapy:  Heparin  level 0.3-0.5 units/mL Monitor platelets by anticoagulation protocol: Yes   Plan:  Cont heparin 1200 units/hr  No bolus with recent procedure Coumadin '5mg'$  PO x1 Daily heparin level, INR, and CBC  Monitor for s/sx bleeding   Onnie Boer, PharmD, BCIDP, AAHIVP, CPP Infectious Disease Pharmacist 06/09/2022 9:27 PM

## 2022-06-09 NOTE — Progress Notes (Signed)
Subjective: No acute events overnight.  Wincing to painful stimuli today.  PYK:DXIPJA to obtain due to poor mental status  Examination  Vital signs in last 24 hours: Temp:  [96.8 F (36 C)-99.1 F (37.3 C)] 97.2 F (36.2 C) (08/10 1000) Pulse Rate:  [57-91] 85 (08/10 1000) Resp:  [16-29] 23 (08/10 1000) BP: (92-173)/(47-85) 155/71 (08/10 1000) SpO2:  [98 %-100 %] 100 % (08/10 1000) FiO2 (%):  [40 %-100 %] 40 % (08/10 1000) Weight:  [79.3 kg] 79.3 kg (08/10 0500)  General: lying in bed, NAD Neuro:  comatose, pupils 3 equal and reactive to light, corneal reflex present, cough reflex present, winces to noxious stimuli in all extremities  Basic Metabolic Panel: Recent Labs  Lab 06/05/22 0402 06/05/22 1606 06/06/22 0431 06/07/22 0350 06/07/22 1704 06/08/22 0342 06/08/22 1519 06/09/22 0329  NA 136   < > 135 136 136 137 137 138  K 4.8   < > 3.4* 5.4* 5.6* 3.6 4.0 3.0*  CL 103   < > 102 102 102 100 102 103  CO2 25   < > 26 21* '23 24 24 26  '$ GLUCOSE 180*   < > 149* 251* 250* 115* 101* 67*  BUN 50*   < > 43* 104* 97* 66* 56* 37*  CREATININE 1.01*   < > 0.92 2.00* 1.84* 1.27* 1.16* 0.91  CALCIUM 7.7*   < > 7.8* 8.1* 7.7* 7.7* 7.8* 7.8*  MG 2.5*  --  2.4 2.7*  --  2.4  --  2.5*  PHOS 4.3   < > 3.6 10.2* 10.1* 5.0* 6.1* 3.7   < > = values in this interval not displayed.    CBC: Recent Labs  Lab 06/03/22 0415 06/04/22 0208 06/05/22 0402 06/06/22 0022 06/06/22 0431 06/07/22 0350 06/08/22 0342 06/09/22 0329  WBC 61.3* 36.3* 30.3* 20.8* 18.8* 16.3* 12.9* 10.2  NEUTROABS 57.6* 32.6* 27.6* 18.5* 16.9*  --   --   --   HGB 9.9* 9.2* 8.6* 8.4* 8.4* 8.1* 7.4* 7.7*  HCT 31.4* 29.0* 27.0* 27.0* 27.0* 25.9* 23.7* 24.2*  MCV 78.7* 79.2* 78.7* 80.1 79.6* 80.9 80.6 80.7  PLT 324 211 189 188 168 161 169 178     Coagulation Studies: No results for input(s): "LABPROT", "INR" in the last 72 hours.  Imaging No new brain imaging overnight  ASSESSMENT AND PLAN: 66 year old female  with suspected viral meningo-encephalitis (had hand-foot-and-mouth disease prior to presentation) now with status epilepticus.   Focal convulsive status epilepticus, resolved Provoked seizures Acute encephalopathy, infectious Entero-Viral meningo-encephalitis Left meningioma Acute ischemic stroke, incidental Obstructive hydrocephalus Immunocompromised  History of small lymphocytic like B-cell non-Hodgkin lymphoma -Patient had exposure to hand-foot-and-mouth disease and subsequently enterovirus encephalitis.   -Status epilepticus likely due to viral encephalitis, meningioma (meningiomas do not commonly cause status epilepticus but the location of this meningioma is concerning).   -Ischemic strokes and likely due to encephalitis   Recommendations -Continue Keppra 1 g twice daily, Vimpat 200 mg twice daily, perampanel 4 mg daily  -Plan to eventually switch patient to Eliquis which interacts with phenobarbital.  Therefore we will stop phenobarbital and switch to Onfi 10 mg twice daily -Continue LTM EEG to look for intermittent seizures -Patient has had some improvement in exam today and is now wincing to noxious stimuli.  We will continue to monitor for improvement.  Next goals of care meeting most likely on Wednesday 06/15/2022 -Continue seizure precautions -Discussed plan with ICU team, bedside nurse, daughter at bedside as well as pharmacy  team   I have spent a total of 36 minutes with the patient reviewing hospital notes,  test results, labs and examining the patient as well as establishing an assessment and plan.  > 50% of time was spent in direct patient care.   Zeb Comfort Epilepsy [facility] For questions after 5pm please refer to AMION to reach the Neurologist on call

## 2022-06-10 ENCOUNTER — Inpatient Hospital Stay (HOSPITAL_COMMUNITY): Payer: Medicare Other

## 2022-06-10 DIAGNOSIS — G40901 Epilepsy, unspecified, not intractable, with status epilepticus: Secondary | ICD-10-CM | POA: Diagnosis not present

## 2022-06-10 DIAGNOSIS — G039 Meningitis, unspecified: Secondary | ICD-10-CM | POA: Diagnosis not present

## 2022-06-10 DIAGNOSIS — R4182 Altered mental status, unspecified: Secondary | ICD-10-CM | POA: Diagnosis not present

## 2022-06-10 LAB — RENAL FUNCTION PANEL
Albumin: 2.2 g/dL — ABNORMAL LOW (ref 3.5–5.0)
Albumin: 2.3 g/dL — ABNORMAL LOW (ref 3.5–5.0)
Anion gap: 8 (ref 5–15)
Anion gap: 7 (ref 5–15)
BUN: 29 mg/dL — ABNORMAL HIGH (ref 8–23)
BUN: 33 mg/dL — ABNORMAL HIGH (ref 8–23)
CO2: 25 mmol/L (ref 22–32)
CO2: 25 mmol/L (ref 22–32)
Calcium: 8 mg/dL — ABNORMAL LOW (ref 8.9–10.3)
Calcium: 8 mg/dL — ABNORMAL LOW (ref 8.9–10.3)
Chloride: 102 mmol/L (ref 98–111)
Chloride: 106 mmol/L (ref 98–111)
Creatinine, Ser: 0.71 mg/dL (ref 0.44–1.00)
Creatinine, Ser: 0.74 mg/dL (ref 0.44–1.00)
GFR, Estimated: >60 mL/min (ref >60)
GFR, Estimated: >60 mL/min (ref >60)
Glucose, Bld: 235 mg/dL — ABNORMAL HIGH (ref 70–99)
Glucose, Bld: 255 mg/dL — ABNORMAL HIGH (ref 70–99)
Phosphorus: 2.6 mg/dL (ref 2.5–4.6)
Phosphorus: 3.2 mg/dL (ref 2.5–4.6)
Potassium: 3.9 mmol/L (ref 3.5–5.1)
Potassium: 5 mmol/L (ref 3.5–5.1)
Sodium: 135 mmol/L (ref 135–145)
Sodium: 138 mmol/L (ref 135–145)

## 2022-06-10 LAB — CBC
HCT: 25.6 % — ABNORMAL LOW (ref 36.0–46.0)
Hemoglobin: 7.9 g/dL — ABNORMAL LOW (ref 12.0–15.0)
MCH: 26.4 pg (ref 26.0–34.0)
MCHC: 30.9 g/dL (ref 30.0–36.0)
MCV: 85.6 fL (ref 80.0–100.0)
Platelets: 154 10*3/uL (ref 150–400)
RBC: 2.99 MIL/uL — ABNORMAL LOW (ref 3.87–5.11)
RDW: 23.3 % — ABNORMAL HIGH (ref 11.5–15.5)
WBC: 11.2 10*3/uL — ABNORMAL HIGH (ref 4.0–10.5)
nRBC: 0 % (ref 0.0–0.2)

## 2022-06-10 LAB — GLUCOSE, CAPILLARY
Glucose-Capillary: 224 mg/dL — ABNORMAL HIGH (ref 70–99)
Glucose-Capillary: 231 mg/dL — ABNORMAL HIGH (ref 70–99)
Glucose-Capillary: 182 mg/dL — ABNORMAL HIGH (ref 70–99)
Glucose-Capillary: 242 mg/dL — ABNORMAL HIGH (ref 70–99)
Glucose-Capillary: 215 mg/dL — ABNORMAL HIGH (ref 70–99)
Glucose-Capillary: 217 mg/dL — ABNORMAL HIGH (ref 70–99)
Glucose-Capillary: 176 mg/dL — ABNORMAL HIGH (ref 70–99)

## 2022-06-10 LAB — HEPARIN LEVEL (UNFRACTIONATED): Heparin Unfractionated: 0.37 IU/mL (ref 0.30–0.70)

## 2022-06-10 LAB — PROTIME-INR
INR: 1.1 (ref 0.8–1.2)
Prothrombin Time: 14.1 seconds (ref 11.4–15.2)

## 2022-06-10 LAB — MAGNESIUM: Magnesium: 2.5 mg/dL — ABNORMAL HIGH (ref 1.7–2.4)

## 2022-06-10 MED ORDER — CLOBAZAM 10 MG PO TABS: 5.0000 mg | ORAL_TABLET | Freq: Every day | ORAL | Status: DC

## 2022-06-10 MED ORDER — CLOBAZAM 10 MG PO TABS
10.0000 mg | ORAL_TABLET | Freq: Every morning | ORAL | Status: DC
Start: 2022-06-11 — End: 2022-06-11

## 2022-06-10 MED ORDER — INSULIN ASPART 100 UNIT/ML IJ SOLN
4.0000 [IU] | INTRAMUSCULAR | Status: DC
  Administered 2022-06-10 (×3): 4 [IU] via SUBCUTANEOUS

## 2022-06-10 MED ORDER — WARFARIN SODIUM 5 MG PO TABS
5.0000 mg | ORAL_TABLET | Freq: Once | ORAL | Status: AC
  Administered 2022-06-10: 5 mg via ORAL
  Filled 2022-06-10: qty 1

## 2022-06-10 MED ORDER — INSULIN ASPART 100 UNIT/ML IJ SOLN
8.0000 [IU] | INTRAMUSCULAR | Status: DC
  Administered 2022-06-10 – 2022-06-14 (×21): 8 [IU] via SUBCUTANEOUS
  Administered 2022-06-14: 4 [IU] via SUBCUTANEOUS

## 2022-06-10 NOTE — Progress Notes (Signed)
CBGs still elevated. Rn stated that Dr. Valeta Harms ok to increase scheduled novolog to 8 units q4h  Onnie Boer, PharmD, [place], AAHIVP, CPP Infectious Disease Pharmacist 06/10/2022 5:44 PM

## 2022-06-10 NOTE — Progress Notes (Signed)
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   Discharge summary reviewed: She initially presented to OSH on 05/20/22 for left upper extremity tremors. She reported recent hand foot any mouth disease contracted from her grandchild that left her weak and tired prior to this admission. Neurology work-up including MRI, EEG negative. MRI/MRA neg for acute infarct, 13 mm focus in left frontal lobe representing artifact vs small calcified meningioma, chronic small vessel ischemic changes, cerebral atrophy, high-grade stenosis in bilateral ICA, focal stenosis in left ACA and intracranial right vertebral artery. MRI of C-spine showing C4-5 cord compression from advanced disc degeneration with posterior endplate ridging.  Showing left foraminal impingement at the same level.  Also showing C5-6 and C6-7 ADCF with solid arthrodesis.  Neurologist at [facility] had recommended neurosurgical evaluation.  She developed fevers and transferred to ICU concerning for meningitis and started on Vanc, rocephin, ampicillin and acyclovir. LP was performed on 7/24 with WBC 55 RBC 2. Pending herpes, enterovirus, ehrlichia. CT A/P showed bilateral ground glass and lung opacities concerning for multifocal pneumonia.   PCCM consulted for decreased responsiveness and hypoxemia on NRB. On exam obtunded and only able to say name. O2 sats in the 70s on NRB. Transferred to the ICU for emergent intubation   Pertinent  Medical History  As above  Significant Hospital Events: Including procedures,  antibiotic start and stop dates in addition to other pertinent events   7/24 Admitted to [facility] from [place]. Transferred to PCCM when found in respiratory failure 7/25 intubated, bronched, art line placed, PICC placed.  Neuro and ID consulted. Started decadron  7/26 decadron dc/d 7/27 felt most consistent with viral meningoencephalitis  7/29-7/30 sudden onset multiorgan failure; given tpa with improvement 8/4 trach 8/6 trach bleeding  Interim History / Subjective:   Remains critically intubated on life support and crrt  Dsicussed with husband at bedside and Dr. Arty Baumgartner from nephrology   Objective   Blood pressure (!) 110/51, pulse (!) 109, temperature 98.4 F (36.9 C), temperature source Rectal, resp. rate (!) 30, height 5' 5.98" (1.676 m), weight 75.9 kg, SpO2 93 %. CVP:  [0 mmHg-14 mmHg] 0 mmHg  Vent Mode: PRVC FiO2 (%):  [28 %-40 %] 35 % Set Rate:  [18 bmp] 18 bmp Vt Set:  [480 mL] 480 mL PEEP:  [8 cmH20] 8 cmH20 Pressure Support:  [12 cmH20] 12 cmH20 Plateau Pressure:  [15 cmH20-26 cmH20] 15 cmH20   Intake/Output Summary (Last 24 hours) at 06/10/2022 0841 Last data filed at 06/10/2022 0800 Gross per 24 hour  Intake 2822.56 ml  Output 4333 ml  Net -1510.44 ml   Filed Weights   06/08/22 0500 06/09/22 0500 06/10/22 0500  Weight: 81.6 kg 79.3 kg 75.9 kg   Physical Exam: Gen: elderly FM, tracheostomy tube in place. HEENT: Tracheostomy tube in place, NCAT, pupils equal Heart: Regular rhythm S1-S2 Lungs: Bilateral mechanically ventilated breath sounds Extremities: No significant edema, some dependent edema in the hands PEG site, wrapped with binder.  Labs: Reviewed  Resolved Hospital Problem list   Metabolic acidosis Hypertriglycerides being off propofol infusion Severe  metabolic acidosis UTI  Assessment & Plan:  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  Preceding hand/foot mouth.  CSF enterovirus + Critical cervical myopathy s/p ACDF  C5-C7- makes repeat LP dangerous. She remains severely encephalopathic, not waking up; recovery could take weeks to months if it occurs.  Repeat MRI showing some progression which is concerning. Plan: Continue to hold sedation Supportive care Remains on droplet precautions Steroid taper Multidisciplinary discussion on 06/07/2022 decision was made to continue supportive care through 06/14/2022.  If no neurologic improvement will recommend transition to comfort care.   Dysphagia- s/p PEG, Plan: Continue tube feeds  Acute hypoxemic/hypercapnic respiratory failure 2/2 multilobar PNA s/p tracheostomy Leukamoid reaction vs. HCAP- improving Completed 5-day course of meropenem Remains on trach collar trial  Tracheostomy site bleeding- resolved  Small lymphocytic B cell non-hodgkin lymphoma Hypogammaglobinemia- Patient is on Rituxan as an outpatient; last dose in June Plan: Supportive care  Status epilepticus- resolved but requiring multiple AEDs Plan: Continue Keppra Vimpat plus phenobarbital Continue to transition off phenobarbital per neuro. Continuous EEG per neuro  DM2 with hypoglycemia Plan: Tube feeds, Levemir plus tube feed coverage SSI  Sudden onset shock, acute renal failure, acute liver failure, RV dilation presumed massive PE (7/29-7/30)- s/p tpa with good improvement in hemodynamics; now off presors Plan: Continue midodrine Remains on heparin. CVVHD per nephrology.  Best Practice (right click and "Reselect all SmartList Selections" daily)   Diet/type: Tube feeds DVT prophylaxis: heparin gtt GI prophylaxis: PPI Lines: PICC  Foley: recurrent retention due to neurological injury, continue foley for now Code Status:  full code Last date of multidisciplinary goals of care discussion: 8/8, 1 week trial as above  This patient is critically ill with multiple organ system failure; which, requires frequent high complexity decision making, assessment, support, evaluation,  and titration of therapies. This was completed through the application of advanced monitoring technologies and extensive interpretation of multiple databases. During this encounter critical care time was devoted to patient care services described in this note for 32 minutes.  Garner Nash, DO [facility] 06/10/2022 8:42 AM

## 2022-06-10 NOTE — Progress Notes (Signed)
Patient ID: Valerie Cline, female   DOB: 1956/03/22, 66 y.o.   MRN: [id number] S:No events overnight. O:BP (!) 94/46   Pulse (!) 108   Temp 99 F (37.2 C) (Rectal)   Resp (!) 30   Ht 5' 5.98" (1.676 m)   Wt 75.9 kg   SpO2 95%   BMI 27.02 kg/m   Intake/Output Summary (Last 24 hours) at 06/10/2022 0950 Last data filed at 06/10/2022 0900 Gross per 24 hour  Intake 2869.09 ml  Output 4206 ml  Net -1336.91 ml   Intake/Output: I/O last 3 completed shifts: In: 3340.7 [I.V.:1095.5; Other:390; NG/GT:1420.3; IV Piggyback:435] Out: 0867 [Urine:52; Drains:400; Stool:3]  Intake/Output this shift:  Total I/O In: 254 [I.V.:64; Other:60; NG/GT:130] Out: 177 [Urine:5] Weight change: -3.4 kg Gen: unresponsive on vent via trach YPP:[id number] Resp: ventilated BS bilaterally Abd:+BS, soft, NT/ND Ext: no edema  Recent Labs  Lab 06/07/22 0350 06/07/22 1704 06/08/22 0342 06/08/22 1519 06/09/22 0329 06/09/22 1649 06/10/22 0310  NA 136 136 137 137 138 136 135  K 5.4* 5.6* 3.6 4.0 3.0* 4.3 3.9  CL 102 102 100 102 103 103 102  CO2 21* '23 24 24 26 25 25  '$ GLUCOSE 251* 250* 115* 101* 67* 221* 235*  BUN 104* 97* 66* 56* 37* 32* 29*  CREATININE 2.00* 1.84* 1.27* 1.16* 0.91 0.78 0.71  ALBUMIN 2.2* 2.2* 2.1* 2.3* 2.2* 2.3* 2.2*  CALCIUM 8.1* 7.7* 7.7* 7.8* 7.8* 7.9* 8.0*  PHOS 10.2* 10.1* 5.0* 6.1* 3.7 4.3 2.6   Liver Function Tests: Recent Labs  Lab 06/09/22 0329 06/09/22 1649 06/10/22 0310  ALBUMIN 2.2* 2.3* 2.2*   No results for input(s): "LIPASE", "AMYLASE" in the last 168 hours. No results for input(s): "AMMONIA" in the last 168 hours. CBC: Recent Labs  Lab 06/05/22 0402 06/06/22 0022 06/06/22 0431 06/07/22 0350 06/08/22 0342 06/09/22 0329 06/10/22 0310  WBC 30.3* 20.8* 18.8* 16.3* 12.9* 10.2 11.2*  NEUTROABS 27.6* 18.5* 16.9*  --   --   --   --   HGB 8.6* 8.4* 8.4* 8.1* 7.4* 7.7* 7.9*  HCT 27.0* 27.0* 27.0* 25.9* 23.7* 24.2* 25.6*  MCV 78.7* 80.1 79.6* 80.9 80.6 80.7 85.6   PLT 189 188 168 161 169 178 154   Cardiac Enzymes: No results for input(s): "CKTOTAL", "CKMB", "CKMBINDEX", "TROPONINI" in the last 168 hours. CBG: Recent Labs  Lab 06/09/22 1515 06/09/22 1914 06/09/22 2316 06/10/22 0306 06/10/22 0815  GLUCAP 231* 237* 229* 224* 231*    Iron Studies: No results for input(s): "IRON", "TIBC", "TRANSFERRIN", "FERRITIN" in the last 72 hours. Studies/Results: IR GASTROSTOMY TUBE MOD SED  Result Date: 06/08/2022 INDICATION: Meningitis, encephalopathy EXAM: FLUOROSCOPIC 20 FRENCH PULL-THROUGH GASTROSTOMY Date:  06/08/2022 06/08/2022 11:29 am Radiologist:  M. Daryll Brod, MD Guidance:  Fluoroscopic MEDICATIONS: Patient is already receiving IV antibiotics as an inpatient glucagon 0.5 mg IV ANESTHESIA/SEDATION: Moderate Sedation Time:  None. The patient was continuously monitored during the procedure by the interventional radiology nurse under my direct supervision. CONTRAST:  20 cc omni 300-administered into the gastric lumen. FLUOROSCOPY: Fluoroscopy Time: 1 minutes 42 seconds (52 mGy). COMPLICATIONS: None immediate. PROCEDURE: Informed consent was obtained from the patient following explanation of the procedure, risks, benefits and alternatives. The patient understands, agrees and consents for the procedure. All questions were addressed. A time out was performed. Maximal barrier sterile technique utilized including caps, mask, sterile gowns, sterile gloves, large sterile drape, hand hygiene, and betadine prep. The left upper quadrant was sterilely prepped and draped. An oral gastric  catheter was inserted into the stomach under fluoroscopy. The existing nasogastric feeding tube was removed. Air was injected into the stomach for insufflation and visualization under fluoroscopy. The air distended stomach was confirmed beneath the anterior abdominal wall in the frontal and lateral projections. Under sterile conditions and local anesthesia, a 64 gauge trocar needle was utilized  to access the stomach percutaneously beneath the left subcostal margin. Needle position was confirmed within the stomach under biplane fluoroscopy. Contrast injection confirmed position also. A single T tack was deployed for gastropexy. Over an Amplatz guide wire, a 9-French sheath was inserted into the stomach. A snare device was utilized to capture the oral gastric catheter. The snare device was pulled retrograde from the stomach up the esophagus and out the oropharynx. The 20-French pull-through gastrostomy was connected to the snare device and pulled antegrade through the oropharynx down the esophagus into the stomach and then through the percutaneous tract external to the patient. The gastrostomy was assembled externally. Contrast injection confirms position in the stomach. Images were obtained for documentation. The patient tolerated procedure well. No immediate complication. IMPRESSION: Fluoroscopic insertion of a 20-French "pull-through" gastrostomy. Electronically Signed   By: Jerilynn Mages.  Shick M.D.   On: 06/08/2022 11:38    Chlorhexidine Gluconate Cloth  6 each Topical Daily   cloBAZam  10 mg Per Tube BID   docusate  100 mg Per Tube BID   fentaNYL (SUBLIMAZE) injection  200 mcg Intravenous Once   insulin aspart  0-20 Units Subcutaneous Q4H   insulin aspart  4 Units Subcutaneous Q4H   insulin detemir  25 Units Subcutaneous BID   midazolam  5 mg Intravenous Once   midodrine  10 mg Per Tube TID WC   multivitamin  1 tablet Per Tube QHS   mouth rinse  15 mL Mouth Rinse Q2H   pantoprazole  40 mg Per Tube Daily   perampanel  4 mg Per Tube Q1200   polyethylene glycol  17 g Per Tube Daily   [START ON 06/11/2022] predniSONE  20 mg Per Tube Q breakfast   sodium chloride flush  10-40 mL Intracatheter Q12H   warfarin  5 mg Oral ONCE-1600   Warfarin - Pharmacist Dosing Inpatient   Does not apply q1600    BMET    Component Value Date/Time   NA 135 06/10/2022 0310   NA 141 11/30/2021 1643   K 3.9  06/10/2022 0310   CL 102 06/10/2022 0310   CO2 25 06/10/2022 0310   GLUCOSE 235 (H) 06/10/2022 0310   BUN 29 (H) 06/10/2022 0310   BUN 15 11/30/2021 1643   CREATININE 0.71 06/10/2022 0310   CREATININE 0.76 04/05/2022 0830   CALCIUM 8.0 (L) 06/10/2022 0310   GFRNONAA >60 06/10/2022 0310   GFRNONAA >60 04/05/2022 0830   GFRAA >60 07/09/2020 0743   CBC    Component Value Date/Time   WBC 11.2 (H) 06/10/2022 0310   RBC 2.99 (L) 06/10/2022 0310   HGB 7.9 (L) 06/10/2022 0310   HGB 12.9 04/05/2022 0830   HCT 25.6 (L) 06/10/2022 0310   PLT 154 06/10/2022 0310   PLT 222 04/05/2022 0830   MCV 85.6 06/10/2022 0310   MCH 26.4 06/10/2022 0310   MCHC 30.9 06/10/2022 0310   RDW 23.3 (H) 06/10/2022 0310   LYMPHSABS 0.3 (L) 06/06/2022 0431   MONOABS 1.0 06/06/2022 0431   EOSABS 0.1 06/06/2022 0431   BASOSABS 0.0 06/06/2022 0431      Assessment/Plan: ARF, oliguric- b/l creat 0.8 on  admission. Creat rising now in setting of new onset shock. Pt admitted for viral meningoencephalitis complicated by critical cervical myopathy and now resp failure w/ multifocal PNA. Pt has lymphoma receiving active chemoRx (rituximab) w/ last dose in June 2023. AKI due to shock which started in last 24 hrs abruptly. CCM suspecting acute PE, s/p TPA and now on IV heparin. K+ up to 6.8 -> CCRT 7/30 w/ all 2K fluids -> 4K now. No signs of renal recovery. Very minimal UOP.   continue with current CRRT orders CRRT stopped early 06/06/22, however her K is up to 6 and BUN is up to 104.  Resumed CRRT 06/07/22 and will continue until 06/14/22.  Fluids changed to 4K/2.5Ca due to hypokalemia this morning. If no neurological improvement, agree with recommendation to transition to comfort care after a time-limited trial of aggressive care.     Volume - mod edema on exam, I/O + 3 L since admit. Tolerated UF of 50-193m/hr. Shock - maybe from multilobar PNA + viral menigoencephalitis, as above, on vasopressor support,  H/o lymphoma -  getting active chemoRx w/ last dose rituxan in June 2023 AHRF - on vent, multifocal PNA on broad spec IV abx per CCM Status epilepticus on propofol AMS - in setting of viral meningoencephalitis.  Remains unresponsive despite being off of sedation for over 96 hours and progressive changes seen on MRI.  Neurology and ID following.   Cervical myelopathy  DM2 - on insulin Disposition - remains unresponsive off of sedation for over 96 hours.  After multidisciplinary discussion, decision was to continue with aggressive care for a time limited trial.  If by 06/14/22 she has no improvement, will recommend transitioning to comfort care.  JDonetta Potts MD [facility]

## 2022-06-10 NOTE — Progress Notes (Signed)
LTM maint complete - no skin breakdown under: Fp1 Fp2  Serviced Pz F[facility]1 [facility] monitored, Event button test confirmed by [facility].

## 2022-06-10 NOTE — Progress Notes (Signed)
ANTICOAGULATION CONSULT NOTE  Pharmacy Consult for heparin Indication: pulmonary embolus  Allergies  Allergen Reactions   Canagliflozin Other (See Comments)    recurrent vaginitis   Glucophage [Metformin] Other (See Comments)    GI intolerance   Niaspan [Niacin] Other (See Comments)    flushing    Patient Measurements: Height: 5' 5.98" (167.6 cm) Weight: 79.3 kg (174 lb 13.2 oz) IBW/kg (Calculated) : 59.25 Heparin Dosing Weight: 79kg  Vital Signs: Temp: 97.9 F (36.6 C) (08/11 0400) Temp Source: Rectal (08/11 0400) BP: 111/55 (08/11 0430) Pulse Rate: 79 (08/11 0430)  Labs: Recent Labs    06/08/22 0342 06/08/22 1519 06/09/22 0329 06/09/22 1649 06/09/22 2045 06/10/22 0310  HGB 7.4*  --  7.7*  --   --  7.9*  HCT 23.7*  --  24.2*  --   --  25.6*  PLT 169  --  178  --   --  154  LABPROT  --   --   --   --  14.0 14.1  INR  --   --   --   --  1.1 1.1  HEPARINUNFRC 0.41   < > 0.22*  --  0.41 0.37  CREATININE 1.27*   < > 0.91 0.78  --  0.71   < > = values in this interval not displayed.     Estimated Creatinine Clearance: 72.5 mL/min (by C-G formula based on SCr of 0.71 mg/dL).   Assessment: 47 yoF admitted with meningitis from OSH. Pt with ongoing fevers and worsening clinical status 7/29, RV severely down on beside ECHO so concerns for acute PE. Pt received alteplase on 7/30 for suspected saddle PE.    Heparin level therapeutic at 0.37. Hg remains low, but plts stable. No bleeding noted. Discussed with CCM and will continue heparin until INR is therapeutic. At least 21 days washout of phenobarbital is required before pt is eligible for DOAC, so pharmacy is consulted to dose warfarin in the meantime.  Goal of Therapy:  Heparin level 0.3-0.5 units/mL Monitor platelets by anticoagulation protocol: Yes   Plan:  Cont heparin 1200 units/hr  Coumadin '5mg'$  PO x1 Daily heparin level, INR, and CBC  Monitor for s/sx bleeding   Thank you for allowing pharmacy to  participate in this patient's care.  Reatha Harps, PharmD PGY2 Pharmacy Resident 06/10/2022 4:58 AM Check [email or web address] for unit specific pharmacy number

## 2022-06-10 NOTE — Progress Notes (Signed)
LTM maint complete - no skin breakdown Fp1 F3 F7 serviced C3 P3 F3 Pz [facility] monitored, Event button test confirmed by [facility].

## 2022-06-10 NOTE — Progress Notes (Addendum)
Subjective: No acute events overnight. Per RN, patient did open her eyes yesterday evening but is barely wincing to noxious stimuli today.  Did receive Keppra and Onfi prior to my exam.  ROS: Unable to obtain due to poor mental status  Examination  Vital signs in last 24 hours: Temp:  [96.8 F (36 C)-99.5 F (37.5 C)] 99.5 F (37.5 C) (08/11 1000) Pulse Rate:  [79-110] 107 (08/11 1000) Resp:  [8-33] 29 (08/11 1000) BP: (93-146)/(41-69) 126/59 (08/11 1000) SpO2:  [89 %-100 %] 95 % (08/11 1000) FiO2 (%):  [28 %-40 %] 35 % (08/11 1000) Weight:  [75.9 kg] 75.9 kg (08/11 0500)  General: lying in bed, NAD Neuro:  comatose, pupils 3 equal and reactive to light, corneal reflex present, cough reflex present, winces to noxious stimuli in right upper extremity, no response to noxious stimuli in all other extremities  Basic Metabolic Panel: Recent Labs  Lab 06/06/22 0431 06/07/22 0350 06/07/22 1704 06/08/22 0342 06/08/22 1519 06/09/22 0329 06/09/22 1649 06/10/22 0310  NA 135 136   < > 137 137 138 136 135  K 3.4* 5.4*   < > 3.6 4.0 3.0* 4.3 3.9  CL 102 102   < > 100 102 103 103 102  CO2 26 21*   < > '24 24 26 25 25  '$ GLUCOSE 149* 251*   < > 115* 101* 67* 221* 235*  BUN 43* 104*   < > 66* 56* 37* 32* 29*  CREATININE 0.92 2.00*   < > 1.27* 1.16* 0.91 0.78 0.71  CALCIUM 7.8* 8.1*   < > 7.7* 7.8* 7.8* 7.9* 8.0*  MG 2.4 2.7*  --  2.4  --  2.5*  --  2.5*  PHOS 3.6 10.2*   < > 5.0* 6.1* 3.7 4.3 2.6   < > = values in this interval not displayed.    CBC: Recent Labs  Lab 06/04/22 0208 06/05/22 0402 06/06/22 0022 06/06/22 0431 06/07/22 0350 06/08/22 0342 06/09/22 0329 06/10/22 0310  WBC 36.3* 30.3* 20.8* 18.8* 16.3* 12.9* 10.2 11.2*  NEUTROABS 32.6* 27.6* 18.5* 16.9*  --   --   --   --   HGB 9.2* 8.6* 8.4* 8.4* 8.1* 7.4* 7.7* 7.9*  HCT 29.0* 27.0* 27.0* 27.0* 25.9* 23.7* 24.2* 25.6*  MCV 79.2* 78.7* 80.1 79.6* 80.9 80.6 80.7 85.6  PLT 211 189 188 168 161 169 178 154      Coagulation Studies: Recent Labs    06/09/22 2044/01/20 06/10/22 0310  LABPROT 14.0 14.1  INR 1.1 1.1    Imaging No new brain imaging overnight   ASSESSMENT AND PLAN: 66 year old female with suspected viral meningo-encephalitis (had hand-foot-and-mouth disease prior to presentation) now with status epilepticus.   Focal convulsive status epilepticus, resolved Provoked seizures Acute encephalopathy, infectious Entero-Viral meningo-encephalitis Left meningioma Acute ischemic stroke, incidental Obstructive hydrocephalus Immunocompromised  History of small lymphocytic like B-cell non-Hodgkin lymphoma -Patient had exposure to hand-foot-and-mouth disease and subsequently enterovirus encephalitis.   -Status epilepticus likely due to viral encephalitis, meningioma (meningiomas do not commonly cause status epilepticus but the location of this meningioma is concerning).   -Ischemic strokes and likely due to encephalitis   Recommendations -Continue Keppra 1 g twice daily, Vimpat 200 mg twice daily, perampanel 4 mg daily  -Reduce Onfi to 10 mg every morning and 5 mg nightly to minimize sedation. - CT head wo contrast to assess for any new strokes, worsening hydrocephalus -Continue LTM EEG to look for intermittent seizures -Discussed plan with husband and RN  at bedside. -Continue seizure precautions  I have spent a total of 35 minutes with the patient reviewing hospital notes,  test results, labs and examining the patient as well as establishing an assessment and plan.  > 50% of time was spent in direct patient care.   Zeb Comfort Epilepsy [facility] For questions after 5pm please refer to AMION to reach the Neurologist on call

## 2022-06-10 NOTE — Procedures (Signed)
MRN: [id number]  Epilepsy Attending: Lora Havens  Referring Physician/Provider: Greta Doom, MD   Duration: 06/09/2022 1014 to 06/10/2022 1014   Patient history: 66yo F with ams. EEG to evaluate for seizure   Level of alertness: comatose   AEDs during EEG study: Keppra, Vimpat, Perampanel, Onfi   Technical aspects: This EEG study was done with scalp electrodes positioned according to the 10-20 International system of electrode placement. Electrical activity was acquired at a sampling rate of '500Hz'$  and reviewed with a high frequency filter of '70Hz'$  and a low frequency filter of '1Hz'$ . EEG data were recorded continuously and digitally stored.    Description: EEG showed near continuous generalized and maximal left posterior quadrant 3 to 7 Hz theta-delta slowing admixed with 12 to 14 Hz generalized beta activity. Polyspikes were also noted in left posterior quadrant which at times appeared quasiperiodic  at 1 Hz.   ABNORMALITY -Polyspikes, left posterior quadrant -Continuous slow, generalized and maximal left posterior quadrant   IMPRESSION: This study showed evidence of epileptogenicity and cortical dysfunction in left posterior quadrant likely due to underlying structural abnormality with increased risk of seizure recurrence. Additionally there was severe diffuse encephalopathy, nonspecific etiology. No definite seizures were seen during this study.  EEG appears to be improving compared to previous day.  Yumi Insalaco [name]

## 2022-06-11 DIAGNOSIS — R4182 Altered mental status, unspecified: Secondary | ICD-10-CM | POA: Diagnosis not present

## 2022-06-11 DIAGNOSIS — G40901 Epilepsy, unspecified, not intractable, with status epilepticus: Secondary | ICD-10-CM | POA: Diagnosis not present

## 2022-06-11 DIAGNOSIS — G039 Meningitis, unspecified: Secondary | ICD-10-CM | POA: Diagnosis not present

## 2022-06-11 LAB — GLUCOSE, CAPILLARY
Glucose-Capillary: 100 mg/dL — ABNORMAL HIGH (ref 70–99)
Glucose-Capillary: 160 mg/dL — ABNORMAL HIGH (ref 70–99)
Glucose-Capillary: 146 mg/dL — ABNORMAL HIGH (ref 70–99)
Glucose-Capillary: 200 mg/dL — ABNORMAL HIGH (ref 70–99)
Glucose-Capillary: 220 mg/dL — ABNORMAL HIGH (ref 70–99)
Glucose-Capillary: 210 mg/dL — ABNORMAL HIGH (ref 70–99)

## 2022-06-11 LAB — RENAL FUNCTION PANEL
Albumin: 2.3 g/dL — ABNORMAL LOW (ref 3.5–5.0)
Albumin: 2.3 g/dL — ABNORMAL LOW (ref 3.5–5.0)
Anion gap: 5 (ref 5–15)
Anion gap: 8 (ref 5–15)
BUN: 28 mg/dL — ABNORMAL HIGH (ref 8–23)
BUN: 30 mg/dL — ABNORMAL HIGH (ref 8–23)
CO2: 27 mmol/L (ref 22–32)
CO2: 25 mmol/L (ref 22–32)
Calcium: 8.2 mg/dL — ABNORMAL LOW (ref 8.9–10.3)
Calcium: 7.8 mg/dL — ABNORMAL LOW (ref 8.9–10.3)
Chloride: 106 mmol/L (ref 98–111)
Chloride: 104 mmol/L (ref 98–111)
Creatinine, Ser: 0.66 mg/dL (ref 0.44–1.00)
Creatinine, Ser: 0.66 mg/dL (ref 0.44–1.00)
GFR, Estimated: >60 mL/min (ref >60)
GFR, Estimated: >60 mL/min (ref >60)
Glucose, Bld: 109 mg/dL — ABNORMAL HIGH (ref 70–99)
Glucose, Bld: 216 mg/dL — ABNORMAL HIGH (ref 70–99)
Phosphorus: 1.9 mg/dL — ABNORMAL LOW (ref 2.5–4.6)
Phosphorus: 3 mg/dL (ref 2.5–4.6)
Potassium: 4 mmol/L (ref 3.5–5.1)
Potassium: 4.7 mmol/L (ref 3.5–5.1)
Sodium: 138 mmol/L (ref 135–145)
Sodium: 137 mmol/L (ref 135–145)

## 2022-06-11 LAB — PROTIME-INR
INR: 1.3 — ABNORMAL HIGH (ref 0.8–1.2)
Prothrombin Time: 16.4 seconds — ABNORMAL HIGH (ref 11.4–15.2)

## 2022-06-11 LAB — CBC
HCT: 26.5 % — ABNORMAL LOW (ref 36.0–46.0)
Hemoglobin: 7.9 g/dL — ABNORMAL LOW (ref 12.0–15.0)
MCH: 26 pg (ref 26.0–34.0)
MCHC: 29.8 g/dL — ABNORMAL LOW (ref 30.0–36.0)
MCV: 87.2 fL (ref 80.0–100.0)
Platelets: 154 10*3/uL (ref 150–400)
RBC: 3.04 MIL/uL — ABNORMAL LOW (ref 3.87–5.11)
RDW: 23.9 % — ABNORMAL HIGH (ref 11.5–15.5)
WBC: 13.8 10*3/uL — ABNORMAL HIGH (ref 4.0–10.5)
nRBC: 0 % (ref 0.0–0.2)

## 2022-06-11 LAB — HEPARIN LEVEL (UNFRACTIONATED): Heparin Unfractionated: 0.28 IU/mL — ABNORMAL LOW (ref 0.30–0.70)

## 2022-06-11 LAB — MAGNESIUM: Magnesium: 2.4 mg/dL (ref 1.7–2.4)

## 2022-06-11 MED ORDER — CLOBAZAM 10 MG PO TABS
5.0000 mg | ORAL_TABLET | Freq: Two times a day (BID) | ORAL | Status: DC
  Administered 2022-06-11 – 2022-06-12 (×4): 5 mg via ORAL
  Filled 2022-06-11 (×4): qty 1

## 2022-06-11 MED ORDER — WARFARIN SODIUM 5 MG PO TABS
5.0000 mg | ORAL_TABLET | Freq: Once | ORAL | Status: AC
  Administered 2022-06-11: 5 mg
  Filled 2022-06-11: qty 1

## 2022-06-11 MED ORDER — K PHOS MONO-SOD PHOS DI & MONO 155-852-130 MG PO TABS
500.0000 mg | ORAL_TABLET | Freq: Three times a day (TID) | ORAL | Status: AC
  Administered 2022-06-11 – 2022-06-12 (×6): 500 mg via ORAL
  Filled 2022-06-11 (×6): qty 2

## 2022-06-11 NOTE — Progress Notes (Signed)
eLink Physician-Brief Progress Note Patient Name: Valerie Cline DOB: [birth year] MRN: [id number]   Date of Service  06/11/2022  HPI/Events of Note  Phos 1.9  eICU Interventions  Neutraphos 500 mg via enteral feeding tube tid x 2 days ordered.        Frederik Pear 06/11/2022, 6:40 AM

## 2022-06-11 NOTE — Progress Notes (Signed)
Patient ID: Valerie Cline, female   DOB: [birth year], 66 y.o.   MRN: [id number] S: Noted to have hypophosphatemia and started on neutra-phos via tube feeds. O:BP (!) 110/47 (BP Location: Right Arm)   Pulse (!) 107   Temp 99.3 F (37.4 C) (Rectal)   Resp (!) 29   Ht 5' 5.98" (1.676 m)   Wt 75.4 kg   SpO2 96%   BMI 26.85 kg/m   Intake/Output Summary (Last 24 hours) at 06/11/2022 0923 Last data filed at 06/11/2022 0800 Gross per 24 hour  Intake 3228.86 ml  Output 4696 ml  Net -1467.14 ml   Intake/Output: I/O last 3 completed shifts: In: 7867 [I.V.:1138.7; EHMCN:470; JG/GE:3662.9; IV Piggyback:390.5] Out: Jermane.Ehrich [Urine:39; Stool:390]  Intake/Output this shift:  Total I/O In: 187.2 [I.V.:32.2; Other:90; NG/GT:65] Out: -  Weight change: -0.5 kg Gen: on vent via trach, unresponsive UTM:LYYTK Resp: ventilated BS bilaterally Abd:+BS, soft Ext: 1+ gravity-dependent edema  Recent Labs  Lab 06/08/22 0342 06/08/22 1519 06/09/22 0329 06/09/22 1649 06/10/22 0310 06/10/22 1623 06/11/22 0500  NA 137 137 138 136 135 138 138  K 3.6 4.0 3.0* 4.3 3.9 5.0 4.0  CL 100 102 103 103 102 106 106  CO2 '24 24 26 25 25 25 27  '$ GLUCOSE 115* 101* 67* 221* 235* 255* 109*  BUN 66* 56* 37* 32* 29* 33* 28*  CREATININE 1.27* 1.16* 0.91 0.78 0.71 0.74 0.66  ALBUMIN 2.1* 2.3* 2.2* 2.3* 2.2* 2.3* 2.3*  CALCIUM 7.7* 7.8* 7.8* 7.9* 8.0* 8.0* 8.2*  PHOS 5.0* 6.1* 3.7 4.3 2.6 3.2 1.9*   Liver Function Tests: Recent Labs  Lab 06/10/22 0310 06/10/22 1623 06/11/22 0500  ALBUMIN 2.2* 2.3* 2.3*   No results for input(s): "LIPASE", "AMYLASE" in the last 168 hours. No results for input(s): "AMMONIA" in the last 168 hours. CBC: Recent Labs  Lab 06/05/22 0402 06/06/22 0022 06/06/22 0431 06/07/22 0350 06/08/22 0342 06/09/22 0329 06/10/22 0310 06/11/22 0500  WBC 30.3* 20.8* 18.8* 16.3* 12.9* 10.2 11.2* 13.8*  NEUTROABS 27.6* 18.5* 16.9*  --   --   --   --   --   HGB 8.6* 8.4* 8.4* 8.1* 7.4* 7.7* 7.9*  7.9*  HCT 27.0* 27.0* 27.0* 25.9* 23.7* 24.2* 25.6* 26.5*  MCV 78.7* 80.1 79.6* 80.9 80.6 80.7 85.6 87.2  PLT 189 188 168 161 169 178 154 154   Cardiac Enzymes: No results for input(s): "CKTOTAL", "CKMB", "CKMBINDEX", "TROPONINI" in the last 168 hours. CBG: Recent Labs  Lab 06/10/22 1753 06/10/22 2018 06/10/22 2313 06/11/22 0314 06/11/22 0756  GLUCAP 215* 217* 176* 100* 160*    Iron Studies: No results for input(s): "IRON", "TIBC", "TRANSFERRIN", "FERRITIN" in the last 72 hours. Studies/Results: CT HEAD WO CONTRAST (5MM)  Result Date: 06/10/2022 CLINICAL DATA:  Delirium, meningitis, hydrocephalus, strokes EXAM: CT HEAD WITHOUT CONTRAST TECHNIQUE: Contiguous axial images were obtained from the base of the skull through the vertex without intravenous contrast. RADIATION DOSE REDUCTION: This exam was performed according to the departmental dose-optimization program which includes automated exposure control, adjustment of the mA and/or kV according to patient size and/or use of iterative reconstruction technique. COMPARISON:  CT head 06/01/2022 correlation is also made with 06/06/2022 MRI head FINDINGS: Brain: Again noted is poor visualization of the sulci, particularly in the parietal and occipital lobes. Overall unchanged ventricular size compared to 06/01/2022 although they are larger compared to 05/20/2022. No evidence of acute infarction, hemorrhage, mass, mass effect, or midline shift. No definite extra-axial collection. Vascular: No hyperdense vessel.  Atherosclerotic calcifications in the intracranial carotid and vertebral arteries. Skull: Normal. Negative for fracture or focal lesion. Sinuses/Orbits: Mucous retention cyst in the right maxillary sinus. Otherwise clear paranasal sinuses. The orbits are unremarkable. Other: Fluid in the right-greater-than-left mastoid air cells. IMPRESSION: 1. Poor visualization of the parietal and occipital sulci, similar to the prior exam, which is  consistent with the abnormal CSF signal and suspected meningitis seen on the 06/06/2022 MRI, although not as extensive. No definite extra-axial collection. 2. Overall unchanged ventricular size compared to 06/01/2022, although these have increased in size compared to 05/20/2022 and remain concerning for communicating hydrocephalus. Electronically Signed   By: Merilyn Baba M.D.   On: 06/10/2022 14:50    Chlorhexidine Gluconate Cloth  6 each Topical Daily   cloBAZam  5 mg Oral BID   docusate  100 mg Per Tube BID   fentaNYL (SUBLIMAZE) injection  200 mcg Intravenous Once   insulin aspart  0-20 Units Subcutaneous Q4H   insulin aspart  8 Units Subcutaneous Q4H   insulin detemir  25 Units Subcutaneous BID   midazolam  5 mg Intravenous Once   midodrine  10 mg Per Tube TID WC   multivitamin  1 tablet Per Tube QHS   mouth rinse  15 mL Mouth Rinse Q2H   pantoprazole  40 mg Per Tube Daily   perampanel  4 mg Per Tube Q1200   phosphorus  500 mg Oral TID   polyethylene glycol  17 g Per Tube Daily   predniSONE  20 mg Per Tube Q breakfast   sodium chloride flush  10-40 mL Intracatheter Q12H   Warfarin - Pharmacist Dosing Inpatient   Does not apply q1600    BMET    Component Value Date/Time   NA 138 06/11/2022 0500   NA 141 11/30/2021 1643   K 4.0 06/11/2022 0500   CL 106 06/11/2022 0500   CO2 27 06/11/2022 0500   GLUCOSE 109 (H) 06/11/2022 0500   BUN 28 (H) 06/11/2022 0500   BUN 15 11/30/2021 1643   CREATININE 0.66 06/11/2022 0500   CREATININE 0.76 04/05/2022 0830   CALCIUM 8.2 (L) 06/11/2022 0500   GFRNONAA >60 06/11/2022 0500   GFRNONAA >60 04/05/2022 0830   GFRAA >60 07/09/2020 0743   CBC    Component Value Date/Time   WBC 13.8 (H) 06/11/2022 0500   RBC 3.04 (L) 06/11/2022 0500   HGB 7.9 (L) 06/11/2022 0500   HGB 12.9 04/05/2022 0830   HCT 26.5 (L) 06/11/2022 0500   PLT 154 06/11/2022 0500   PLT 222 04/05/2022 0830   MCV 87.2 06/11/2022 0500   MCH 26.0 06/11/2022 0500   MCHC  29.8 (L) 06/11/2022 0500   RDW 23.9 (H) 06/11/2022 0500   LYMPHSABS 0.3 (L) 06/06/2022 0431   MONOABS 1.0 06/06/2022 0431   EOSABS 0.1 06/06/2022 0431   BASOSABS 0.0 06/06/2022 0431      Assessment/Plan: ARF, oliguric- b/l creat 0.8 on admission. Creat rising now in setting of new onset shock. Pt admitted for viral meningoencephalitis complicated by critical cervical myopathy and now resp failure w/ multifocal PNA. Pt has lymphoma receiving active chemoRx (rituximab) w/ last dose in June 2023. AKI due to shock which started in last 24 hrs abruptly. CCM suspecting acute PE, s/p TPA and now on IV heparin. K+ up to 6.8 -> CCRT 7/30 w/ all 2K fluids -> 4K now. No signs of renal recovery. Very minimal UOP.   continue with current CRRT orders CRRT stopped early  06/06/22, however her K is up to 6 and BUN is up to 104.  Resumed CRRT 06/07/22 and will continue until 06/14/22.  Fluids changed to 4K/2.5Ca due to hypokalemia this morning. If no neurological improvement, agree with recommendation to transition to comfort care after a time-limited trial of aggressive care.     Volume - mod edema on exam, I/O + 3 L since admit. Tolerated UF of 50-171m/hr. Hypophosphatemia - started on neutra-phos with tube feeds Shock - maybe from multilobar PNA + viral menigoencephalitis, as above, on vasopressor support,  H/o lymphoma - getting active chemoRx w/ last dose rituxan in June 2023 AHRF - on vent, multifocal PNA on broad spec IV abx per CCM Status epilepticus on propofol AMS - in setting of viral meningoencephalitis.  Remains unresponsive despite being off of sedation for over 96 hours and progressive changes seen on MRI.  Neurology and ID following.   Cervical myelopathy  DM2 - on insulin Disposition - remains unresponsive off of sedation for several days.  After multidisciplinary discussion, decision was to continue with aggressive care for a time limited trial.  If by 06/14/22 she has no improvement, will recommend  transitioning to comfort care.  JDonetta Potts MD [facility]

## 2022-06-11 NOTE — Progress Notes (Signed)
EEG maint complete.  ?

## 2022-06-11 NOTE — Progress Notes (Signed)
ANTICOAGULATION CONSULT NOTE  Pharmacy Consult for heparin Indication: pulmonary embolus  Allergies  Allergen Reactions   Canagliflozin Other (See Comments)    recurrent vaginitis   Glucophage [Metformin] Other (See Comments)    GI intolerance   Niaspan [Niacin] Other (See Comments)    flushing    Patient Measurements: Height: 5' 5.98" (167.6 cm) Weight: 75.4 kg (166 lb 3.6 oz) IBW/kg (Calculated) : 59.25 Heparin Dosing Weight: 79kg  Vital Signs: Temp: 99.3 F (37.4 C) (08/12 0600) Temp Source: Rectal (08/12 0400) BP: 129/53 (08/12 0600) Pulse Rate: 109 (08/12 0600)  Labs: Recent Labs    06/09/22 0329 06/09/22 1649 06/09/22 2045 06/10/22 0310 06/10/22 1623 06/11/22 0500  HGB 7.7*  --   --  7.9*  --  7.9*  HCT 24.2*  --   --  25.6*  --  26.5*  PLT 178  --   --  154  --  154  LABPROT  --   --  14.0 14.1  --  16.4*  INR  --   --  1.1 1.1  --  1.3*  HEPARINUNFRC 0.22*  --  0.41 0.37  --  0.28*  CREATININE 0.91   < >  --  0.71 0.74 0.66   < > = values in this interval not displayed.     Estimated Creatinine Clearance: 70.8 mL/min (by C-G formula based on SCr of 0.66 mg/dL).   Assessment: 89 yoF admitted with meningitis from OSH. Pt with ongoing fevers and worsening clinical status 7/29, RV severely down on beside ECHO so concerns for acute PE. Pt received alteplase on 7/30 for suspected saddle PE.    Heparin level is slightly subtherapeutic at 0.28, on heparin infusion at 1200 units/hr. INR is subtherapeutic at 1.3, wafarin started 8/10. Hgb remains low but stable at 7.9, plts stable at 154. No s/sx of bleeding or infusion issues. Discussed with CCM and will continue heparin until INR is therapeutic.   Goal of Therapy:  Heparin level 0.3-0.5 units/mL Monitor platelets by anticoagulation protocol: Yes   Plan:  Increase heparin infusion to 1250 units/hr to get into goal range  Coumadin '5mg'$  PO again tonight Daily heparin level, INR, and CBC  Monitor for s/sx  bleeding   Thank you for allowing pharmacy to participate in this patient's care.  Antonietta Jewel, PharmD, [place] Clinical Pharmacist  Phone: [phone] 06/11/2022 7:19 AM  Please check AMION for all [place] phone numbers After 10:00 PM, call [place] [phone]

## 2022-06-11 NOTE — Progress Notes (Signed)
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   Discharge summary reviewed: She initially presented to OSH on 05/20/22 for left upper extremity tremors. She reported recent hand foot any mouth disease contracted from her grandchild that left her weak and tired prior to this admission. Neurology work-up including MRI, EEG negative. MRI/MRA neg for acute infarct, 13 mm focus in left frontal lobe representing artifact vs small calcified meningioma, chronic small vessel ischemic changes, cerebral atrophy, high-grade stenosis in bilateral ICA, focal stenosis in left ACA and intracranial right vertebral artery. MRI of C-spine showing C4-5 cord compression from advanced disc degeneration with posterior endplate ridging.  Showing left foraminal impingement at the same level.  Also showing C5-6 and C6-7 ADCF with solid arthrodesis.  Neurologist at [facility] had recommended neurosurgical evaluation.  She developed fevers and transferred to ICU concerning for meningitis and started on Vanc, rocephin, ampicillin and acyclovir. LP was performed on 7/24 with WBC 55 RBC 2. Pending herpes, enterovirus, ehrlichia. CT A/P showed bilateral ground glass and lung opacities concerning for multifocal pneumonia.   PCCM consulted for decreased responsiveness and hypoxemia on NRB. On exam obtunded and only able to say name. O2 sats in the 70s on NRB. Transferred to the ICU for emergent intubation   Pertinent  Medical History  As above  Significant Hospital Events: Including procedures,  antibiotic start and stop dates in addition to other pertinent events   7/24 Admitted to [facility] from [place]. Transferred to PCCM when found in respiratory failure 7/25 intubated, bronched, art line placed, PICC placed.  Neuro and ID consulted. Started decadron  7/26 decadron dc/d 7/27 felt most consistent with viral meningoencephalitis  7/29-7/30 sudden onset multiorgan failure; given tpa with improvement 8/4 trach 8/6 trach bleeding 8/12 off sedation awaiting any neurologic response.   Interim History / Subjective:   Remains critically ill on mechanical life support, trach collar trial attempt again today.  She did well with it yesterday.  On vent currently.  Objective   Blood pressure (!) 126/51, pulse (!) 109, temperature 99.5 F (37.5 C), resp. rate (!) 29, height 5' 5.98" (1.676 m), weight 75.4 kg, SpO2 96 %. CVP:  [0 mmHg-12 mmHg] 6 mmHg  Vent Mode: PRVC FiO2 (%):  [35 %-40 %] 40 % Set Rate:  [18 bmp] 18 bmp Vt Set:  [480 mL] 480 mL PEEP:  [8 cmH20] 8 cmH20 Plateau Pressure:  [19 cmH20-26 cmH20] 19 cmH20   Intake/Output Summary (Last 24 hours) at 06/11/2022 0756 Last data filed at 06/11/2022 0700 Gross per 24 hour  Intake 3295.61 ml  Output 5048 ml  Net -1752.39 ml   Filed Weights   06/09/22 0500 06/10/22 0500 06/11/22 0500  Weight: 79.3 kg 75.9 kg 75.4 kg   Physical Exam: Gen: elderly FM, tracheostomy tube in place. HEENT: Tracheostomy tube in place, NCAT, pupils equal Heart: Regular rhythm S1-S2 Lungs: Bilateral mechanically ventilated breath sounds Extremities: No significant edema, some dependent edema in the hands PEG site, wrapped with binder.  Labs: Reviewed  Resolved Hospital Problem list   Metabolic acidosis Hypertriglycerides being off propofol  infusion Severe metabolic acidosis UTI  Assessment & Plan:  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  Preceding hand/foot mouth.  CSF enterovirus + Critical cervical myopathy  s/p ACDF C5-C7- makes repeat LP dangerous. She remains severely encephalopathic, not waking up; recovery could take weeks to months if it occurs.  Repeat MRI showing some progression which is concerning. Plan: Plan is to continue supportive care Hold sedation Remains on droplet precautions Steroid taper Multidisciplinary discussion on 06/07/2022 the decision was made to continue supportive care through 06/14/2022.  If no alert neurologic improvement at that point we would consider transition to comfort care.  Dysphagia- s/p PEG, Plan: Tube feeds  Acute hypoxemic/hypercapnic respiratory failure 2/2 multilobar PNA s/p tracheostomy Leukamoid reaction vs. HCAP- improving Plan: Completed 5-day course of meropenem Back on ventilator overnight. Attempt to transition to TCT again today.  Tracheostomy site bleeding- resolved  Small lymphocytic B cell non-hodgkin lymphoma Hypogammaglobinemia- Patient is on Rituxan as an outpatient; last dose in June Plan: Continue supportive care  Status epilepticus- resolved but requiring multiple AEDs Plan: Continue Keppra, Vimpat, phenobarbital currently coming off. Transitioning off phenobarbital to help with drug interactions related to Eliquis. Continuous EEG per neuro.  DM2 with hypoglycemia Plan: Continue tube feeds Continue Levemir Increased tube feed SSI coverage yesterday.  Sudden onset shock, acute renal failure, acute liver failure, RV dilation presumed massive PE (7/29-7/30)- s/p tpa with good improvement in hemodynamics; now off presors Plan: Continue midodrine Continue heparin CVVHD per nephrology.  Best Practice (right click and "Reselect all SmartList Selections" daily)   Diet/type: Tube feeds DVT prophylaxis: heparin gtt GI prophylaxis: PPI Lines: PICC  Foley: recurrent retention due to neurological injury, continue foley for now Code Status:  full code Last date of multidisciplinary goals of care discussion:  8/8,  Possible palliative care transition on 06/14/2022.  This patient is critically ill with multiple organ system failure; which, requires frequent high complexity decision making, assessment, support, evaluation, and titration of therapies. This was completed through the application of advanced monitoring technologies and extensive interpretation of multiple databases. During this encounter critical care time was devoted to patient care services described in this note for 33 minutes.   Garner Nash, DO [place] [facility] 06/11/2022 7:56 AM

## 2022-06-11 NOTE — Procedures (Signed)
MRN: [id number]  Epilepsy Attending: Lora Havens  Referring Physician/Provider: Greta Doom, MD   Duration: 06/10/2022 1014 to 06/11/2022 1014   Patient history: 66yo F with ams. EEG to evaluate for seizure   Level of alertness: comatose   AEDs during EEG study: Keppra, Vimpat, Perampanel, Onfi   Technical aspects: This EEG study was done with scalp electrodes positioned according to the 10-20 International system of electrode placement. Electrical activity was acquired at a sampling rate of '500Hz'$  and reviewed with a high frequency filter of '70Hz'$  and a low frequency filter of '1Hz'$ . EEG data were recorded continuously and digitally stored.    Description: EEG showed near continuous generalized and maximal left posterior quadrant 3 to 7 Hz theta-delta slowing admixed with 12 to 14 Hz generalized beta activity. Polyspikes were also noted in left posterior quadrant which at times appeared quasiperiodic  at 1 Hz.   ABNORMALITY -Polyspikes, left posterior quadrant -Continuous slow, generalized and maximal left posterior quadrant   IMPRESSION: This study showed evidence of epileptogenicity and cortical dysfunction in left posterior quadrant likely due to underlying structural abnormality. Additionally there was severe diffuse encephalopathy, nonspecific etiology. No definite seizures were seen during this study.  Valerie Cline

## 2022-06-11 NOTE — Progress Notes (Signed)
eLink Physician-Brief Progress Note Patient Name: Valerie Cline DOB: 08-07-1956 MRN: [id number]   Date of Service  06/11/2022  HPI/Events of Note  Blood sugar 100 mg / dl.  eICU Interventions  Novalog scheduled 8  unit dose held.        Valerie Cline 06/11/2022, 3:47 AM

## 2022-06-11 NOTE — Progress Notes (Signed)
LTM maint complete - no skin breakdown under: Fp1 Fp2 Serviced [facility] monitored, Event button test confirmed by [facility].  

## 2022-06-12 DIAGNOSIS — G039 Meningitis, unspecified: Secondary | ICD-10-CM | POA: Diagnosis not present

## 2022-06-12 DIAGNOSIS — G40901 Epilepsy, unspecified, not intractable, with status epilepticus: Secondary | ICD-10-CM | POA: Diagnosis not present

## 2022-06-12 DIAGNOSIS — R4182 Altered mental status, unspecified: Secondary | ICD-10-CM | POA: Diagnosis not present

## 2022-06-12 LAB — HEPARIN LEVEL (UNFRACTIONATED): Heparin Unfractionated: 0.32 IU/mL (ref 0.30–0.70)

## 2022-06-12 LAB — GLUCOSE, CAPILLARY
Glucose-Capillary: 151 mg/dL — ABNORMAL HIGH (ref 70–99)
Glucose-Capillary: 161 mg/dL — ABNORMAL HIGH (ref 70–99)
Glucose-Capillary: 173 mg/dL — ABNORMAL HIGH (ref 70–99)
Glucose-Capillary: 179 mg/dL — ABNORMAL HIGH (ref 70–99)
Glucose-Capillary: 183 mg/dL — ABNORMAL HIGH (ref 70–99)
Glucose-Capillary: 189 mg/dL — ABNORMAL HIGH (ref 70–99)
Glucose-Capillary: 199 mg/dL — ABNORMAL HIGH (ref 70–99)

## 2022-06-12 LAB — RENAL FUNCTION PANEL
Albumin: 2.5 g/dL — ABNORMAL LOW (ref 3.5–5.0)
Albumin: 2.6 g/dL — ABNORMAL LOW (ref 3.5–5.0)
Anion gap: 10 (ref 5–15)
Anion gap: 7 (ref 5–15)
BUN: 27 mg/dL — ABNORMAL HIGH (ref 8–23)
BUN: 26 mg/dL — ABNORMAL HIGH (ref 8–23)
CO2: 26 mmol/L (ref 22–32)
CO2: 27 mmol/L (ref 22–32)
Calcium: 8.4 mg/dL — ABNORMAL LOW (ref 8.9–10.3)
Calcium: 8.4 mg/dL — ABNORMAL LOW (ref 8.9–10.3)
Chloride: 102 mmol/L (ref 98–111)
Chloride: 102 mmol/L (ref 98–111)
Creatinine, Ser: 0.63 mg/dL (ref 0.44–1.00)
Creatinine, Ser: 0.54 mg/dL (ref 0.44–1.00)
GFR, Estimated: >60 mL/min (ref >60)
GFR, Estimated: >60 mL/min (ref >60)
Glucose, Bld: 186 mg/dL — ABNORMAL HIGH (ref 70–99)
Glucose, Bld: 200 mg/dL — ABNORMAL HIGH (ref 70–99)
Phosphorus: 2.3 mg/dL — ABNORMAL LOW (ref 2.5–4.6)
Phosphorus: 2.9 mg/dL (ref 2.5–4.6)
Potassium: 4 mmol/L (ref 3.5–5.1)
Potassium: 5 mmol/L (ref 3.5–5.1)
Sodium: 138 mmol/L (ref 135–145)
Sodium: 136 mmol/L (ref 135–145)

## 2022-06-12 LAB — MAGNESIUM: Magnesium: 2.3 mg/dL (ref 1.7–2.4)

## 2022-06-12 LAB — PROTIME-INR
INR: 1.5 — ABNORMAL HIGH (ref 0.8–1.2)
Prothrombin Time: 17.7 seconds — ABNORMAL HIGH (ref 11.4–15.2)

## 2022-06-12 MED ORDER — WARFARIN SODIUM 5 MG PO TABS
5.0000 mg | ORAL_TABLET | Freq: Once | ORAL | Status: AC
  Administered 2022-06-12: 5 mg via ORAL
  Filled 2022-06-12: qty 1

## 2022-06-12 MED ORDER — ORAL CARE MOUTH RINSE
15.0000 mL | OROMUCOSAL | Status: DC
  Administered 2022-06-12 (×3): 15 mL via OROMUCOSAL

## 2022-06-12 MED ORDER — ORAL CARE MOUTH RINSE: 15.0000 mL | OROMUCOSAL | Status: DC | PRN

## 2022-06-12 NOTE — Procedures (Addendum)
MRN: [id number]  Epilepsy Attending: Lora Havens  Referring Physician/Provider: Greta Doom, MD   Duration: 06/11/2022 1014 to 06/12/2022 1014   Patient history: 66yo F with ams. EEG to evaluate for seizure   Level of alertness: comatose   AEDs during EEG study: Keppra, Vimpat, Perampanel, Onfi   Technical aspects: This EEG study was done with scalp electrodes positioned according to the 10-20 International system of electrode placement. Electrical activity was acquired at a sampling rate of '500Hz'$  and reviewed with a high frequency filter of '70Hz'$  and a low frequency filter of '1Hz'$ . EEG data were recorded continuously and digitally stored.    Description: EEG showed near continuous generalized and maximal left posterior quadrant 3 to 7 Hz theta-delta slowing admixed with 12 to 14 Hz generalized beta activity. Polyspikes were also noted in left posterior quadrant which at times appeared quasiperiodic  at 1 Hz.   ABNORMALITY -Polyspikes, left posterior quadrant -Continuous slow, generalized and maximal left posterior quadrant   IMPRESSION: This study showed evidence of epileptogenicity and cortical dysfunction in left posterior quadrant likely due to underlying structural abnormality. Additionally there was severe diffuse encephalopathy, nonspecific etiology. No definite seizures were seen during this study.   Valerie Cline [name]

## 2022-06-12 NOTE — Progress Notes (Signed)
Patient ID: Valerie Cline, female   DOB: Sep 15, 1956, 66 y.o.   MRN: [id number] S: No events overnight. Off of levophed.   O:BP (!) 116/49   Pulse 96   Temp 98.1 F (36.7 C)   Resp (!) 26   Ht 5' 5.98" (1.676 m)   Wt 73.3 kg   SpO2 100%   BMI 26.10 kg/m   Intake/Output Summary (Last 24 hours) at 06/12/2022 0939 Last data filed at 06/12/2022 0900 Gross per 24 hour  Intake 3063.16 ml  Output 5356 ml  Net -2292.84 ml   Intake/Output: I/O last 3 completed shifts: In: 4625.2 [I.V.:1145.2; Other:750; NG/GT:2340; IV Piggyback:390] Out: 8027 [Urine:25; Stool:380]  Intake/Output this shift:  Total I/O In: 254 [I.V.:74; Other:50; NG/GT:130] Out: 503 [Stool:40] Weight change: -2.1 kg Gen: unresponsive, on vent via trach CVS:RRR Resp:ventilated BS bilaterally Abd:  +Bs, soft Ext: trace presacral edema  Recent Labs  Lab 06/09/22 0329 06/09/22 1649 06/10/22 0310 06/10/22 1623 06/11/22 0500 06/11/22 1636 06/12/22 0447  NA 138 136 135 138 138 137 138  K 3.0* 4.3 3.9 5.0 4.0 4.7 4.0  CL 103 103 102 106 106 104 102  CO2 '26 25 25 25 27 25 26  '$ GLUCOSE 67* 221* 235* 255* 109* 216* 186*  BUN 37* 32* 29* 33* 28* 30* 27*  CREATININE 0.91 0.78 0.71 0.74 0.66 0.66 0.63  ALBUMIN 2.2* 2.3* 2.2* 2.3* 2.3* 2.3* 2.5*  CALCIUM 7.8* 7.9* 8.0* 8.0* 8.2* 7.8* 8.4*  PHOS 3.7 4.3 2.6 3.2 1.9* 3.0 2.3*   Liver Function Tests: Recent Labs  Lab 06/11/22 0500 06/11/22 1636 06/12/22 0447  ALBUMIN 2.3* 2.3* 2.5*   No results for input(s): "LIPASE", "AMYLASE" in the last 168 hours. No results for input(s): "AMMONIA" in the last 168 hours. CBC: Recent Labs  Lab 06/06/22 0022 06/06/22 0431 06/07/22 0350 06/08/22 0342 06/09/22 0329 06/10/22 0310 06/11/22 0500  WBC 20.8* 18.8* 16.3* 12.9* 10.2 11.2* 13.8*  NEUTROABS 18.5* 16.9*  --   --   --   --   --   HGB 8.4* 8.4* 8.1* 7.4* 7.7* 7.9* 7.9*  HCT 27.0* 27.0* 25.9* 23.7* 24.2* 25.6* 26.5*  MCV 80.1 79.6* 80.9 80.6 80.7 85.6 87.2  PLT 188  168 161 169 178 154 154   Cardiac Enzymes: No results for input(s): "CKTOTAL", "CKMB", "CKMBINDEX", "TROPONINI" in the last 168 hours. CBG: Recent Labs  Lab 06/11/22 1815 06/11/22 1954 06/12/22 0055 06/12/22 0339 06/12/22 0805  GLUCAP 220* 210* 199* 179* 189*    Iron Studies: No results for input(s): "IRON", "TIBC", "TRANSFERRIN", "FERRITIN" in the last 72 hours. Studies/Results: CT HEAD WO CONTRAST (5MM)  Result Date: 06/10/2022 CLINICAL DATA:  Delirium, meningitis, hydrocephalus, strokes EXAM: CT HEAD WITHOUT CONTRAST TECHNIQUE: Contiguous axial images were obtained from the base of the skull through the vertex without intravenous contrast. RADIATION DOSE REDUCTION: This exam was performed according to the departmental dose-optimization program which includes automated exposure control, adjustment of the mA and/or kV according to patient size and/or use of iterative reconstruction technique. COMPARISON:  CT head 06/01/2022 correlation is also made with 06/06/2022 MRI head FINDINGS: Brain: Again noted is poor visualization of the sulci, particularly in the parietal and occipital lobes. Overall unchanged ventricular size compared to 06/01/2022 although they are larger compared to 05/20/2022. No evidence of acute infarction, hemorrhage, mass, mass effect, or midline shift. No definite extra-axial collection. Vascular: No hyperdense vessel. Atherosclerotic calcifications in the intracranial carotid and vertebral arteries. Skull: Normal. Negative for fracture or focal  lesion. Sinuses/Orbits: Mucous retention cyst in the right maxillary sinus. Otherwise clear paranasal sinuses. The orbits are unremarkable. Other: Fluid in the right-greater-than-left mastoid air cells. IMPRESSION: 1. Poor visualization of the parietal and occipital sulci, similar to the prior exam, which is consistent with the abnormal CSF signal and suspected meningitis seen on the 06/06/2022 MRI, although not as extensive. No  definite extra-axial collection. 2. Overall unchanged ventricular size compared to 06/01/2022, although these have increased in size compared to 05/20/2022 and remain concerning for communicating hydrocephalus. Electronically Signed   By: Merilyn Baba M.D.   On: 06/10/2022 14:50    Chlorhexidine Gluconate Cloth  6 each Topical Daily   cloBAZam  5 mg Oral BID   docusate  100 mg Per Tube BID   fentaNYL (SUBLIMAZE) injection  200 mcg Intravenous Once   insulin aspart  0-20 Units Subcutaneous Q4H   insulin aspart  8 Units Subcutaneous Q4H   insulin detemir  25 Units Subcutaneous BID   midazolam  5 mg Intravenous Once   midodrine  10 mg Per Tube TID WC   multivitamin  1 tablet Per Tube QHS   mouth rinse  15 mL Mouth Rinse Q2H   pantoprazole  40 mg Per Tube Daily   perampanel  4 mg Per Tube Q1200   phosphorus  500 mg Oral TID   polyethylene glycol  17 g Per Tube Daily   sodium chloride flush  10-40 mL Intracatheter Q12H   Warfarin - Pharmacist Dosing Inpatient   Does not apply q1600    BMET    Component Value Date/Time   NA 138 06/12/2022 0447   NA 141 11/30/2021 1643   K 4.0 06/12/2022 0447   CL 102 06/12/2022 0447   CO2 26 06/12/2022 0447   GLUCOSE 186 (H) 06/12/2022 0447   BUN 27 (H) 06/12/2022 0447   BUN 15 11/30/2021 1643   CREATININE 0.63 06/12/2022 0447   CREATININE 0.76 04/05/2022 0830   CALCIUM 8.4 (L) 06/12/2022 0447   GFRNONAA >60 06/12/2022 0447   GFRNONAA >60 04/05/2022 0830   GFRAA >60 07/09/2020 0743   CBC    Component Value Date/Time   WBC 13.8 (H) 06/11/2022 0500   RBC 3.04 (L) 06/11/2022 0500   HGB 7.9 (L) 06/11/2022 0500   HGB 12.9 04/05/2022 0830   HCT 26.5 (L) 06/11/2022 0500   PLT 154 06/11/2022 0500   PLT 222 04/05/2022 0830   MCV 87.2 06/11/2022 0500   MCH 26.0 06/11/2022 0500   MCHC 29.8 (L) 06/11/2022 0500   RDW 23.9 (H) 06/11/2022 0500   LYMPHSABS 0.3 (L) 06/06/2022 0431   MONOABS 1.0 06/06/2022 0431   EOSABS 0.1 06/06/2022 0431   BASOSABS  0.0 06/06/2022 04387   HPI:  66 year old female with 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  She developed fevers, shock, and intubated due to inability to protect airway secondary to viral meningitis/encephalitis and has remained unresponsive off of sedation.   After multidisciplinary meeting on 06/07/22, the decision was made to continue supportive care through 06/14/22 to evaluate if any neurologic improvement.  If no improvement, would consider transition to comfort care.  Assessment/Plan: ARF, oliguric- b/l creat 0.8 on admission. Creat rising now in setting of new onset shock. Pt admitted for viral meningoencephalitis complicated by critical cervical myopathy and now resp failure w/ multifocal  PNA. Pt has lymphoma receiving active chemoRx (rituximab) w/ last dose in June 2023. AKI due to shock which started in last 24 hrs abruptly. CCM suspecting acute PE, s/p TPA and now on IV heparin. K+ up to 6.8 -> CCRT 7/30 w/ all 2K fluids -> 4K now. No signs of renal recovery. Very minimal UOP.   continue with current CRRT orders CRRT stopped early 06/06/22, however her K is up to 6 and BUN is up to 104.  Resumed CRRT 06/07/22 and will continue until 06/14/22.  Fluids changed to 4K/2.5Ca due to hypokalemia this morning. Continuing CRRT to give her every opportunity to improve her neurologic condition before deciding on transition to comfort care.  If no neurological improvement, agree with recommendation to transition to comfort care after a time-limited trial of aggressive care.     Volume - mild edema on exam, I/O -18 L since admit. Tolerated UF of 50-145m/hr. Will keep even for now and follow.  Hypophosphatemia - started on neutra-phos with tube feeds but may need IV K phos if phos continues to drop.  Shock - maybe from multilobar PNA + viral  menigoencephalitis, as above, on vasopressor support,  H/o lymphoma - getting active chemoRx w/ last dose rituxan in June 2023 AHRF - on vent, multifocal PNA on broad spec IV abx per CCM Status epilepticus on propofol AMS - in setting of viral meningoencephalitis.  Remains unresponsive despite being off of sedation for over 96 hours and progressive changes seen on MRI.  Neurology and ID following.   Cervical myelopathy  DM2 - on insulin Disposition - remains unresponsive off of sedation for several days.  After multidisciplinary discussion, decision was to continue with aggressive care for a time limited trial.  If by 06/14/22 she has no improvement, will recommend transitioning to comfort care.    JDonetta Potts MD [facility]

## 2022-06-12 NOTE — Progress Notes (Signed)
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   Discharge summary reviewed: She initially presented to OSH on 05/20/22 for left upper extremity tremors. She reported recent hand foot any mouth disease contracted from her grandchild that left her weak and tired prior to this admission. Neurology work-up including MRI, EEG negative. MRI/MRA neg for acute infarct, 13 mm focus in left frontal lobe representing artifact vs small calcified meningioma, chronic small vessel ischemic changes, cerebral atrophy, high-grade stenosis in bilateral ICA, focal stenosis in left ACA and intracranial right vertebral artery. MRI of C-spine showing C4-5 cord compression from advanced disc degeneration with posterior endplate ridging.  Showing left foraminal impingement at the same level.  Also showing C5-6 and C6-7 ADCF with solid arthrodesis.  Neurologist at [facility] had recommended neurosurgical evaluation.  She developed fevers and transferred to ICU concerning for meningitis and started on Vanc, rocephin, ampicillin and acyclovir. LP was performed on 7/24 with WBC 55 RBC 2. Pending herpes, enterovirus, ehrlichia. CT A/P showed bilateral ground glass and lung opacities concerning for multifocal pneumonia.   PCCM consulted for decreased responsiveness and hypoxemia on NRB. On exam obtunded and only able to say name. O2 sats in the 70s on NRB. Transferred to the ICU for emergent intubation   Pertinent  Medical History  As above  Significant Hospital Events: Including procedures,  antibiotic start and stop dates in addition to other pertinent events   7/24 Admitted to [facility] from [place]. Transferred to PCCM when found in respiratory failure 7/25 intubated, bronched, art line placed, PICC placed.  Neuro and ID consulted. Started decadron  7/26 decadron dc/d 7/27 felt most consistent with viral meningoencephalitis  7/29-7/30 sudden onset multiorgan failure; given tpa with improvement 8/4 trach 8/6 trach bleeding 8/12 off sedation awaiting any neurologic response.   Interim History / Subjective:   Patient remains critically ill tracheostomy tube in place tolerating trach collar trial again yesterday.  Still no significant neurologic response.  Recent EEG read again this morning with evidence of left intensity cortical dysfunction in the left posterior quadrant.  Severe diffuse encephalopathy nonspecific etiologies.  Objective   Blood pressure (!) 128/54, pulse 99, temperature 98.8 F (37.1 C), resp. rate (!) 32, height 5' 5.98" (1.676 m), weight 73.3 kg, SpO2 98 %. CVP:  [0 mmHg-12 mmHg] 5 mmHg  Vent Mode: PRVC FiO2 (%):  [40 %-60 %] 40 % Set Rate:  [18 bmp] 18 bmp Vt Set:  [480 mL] 480 mL PEEP:  [8 cmH20] 8 cmH20 Plateau Pressure:  [21 cmH20-25 cmH20] 21 cmH20   Intake/Output Summary (Last 24 hours) at 06/12/2022 0757 Last data filed at 06/12/2022 0700 Gross per 24 hour  Intake 3093.9 ml  Output 5272 ml  Net -2178.1 ml   Filed Weights   06/10/22 0500 06/11/22 0500 06/12/22 0446  Weight: 75.9 kg 75.4 kg 73.3 kg   Physical Exam: Gen: Elderly female, tracheostomy tube in place HEENT: Trach in place, NCAT, tracking appropriately Heart: Regular rate rhythm, S1-S2 Lungs: Bilateral mechanically ventilated breath sounds Extremities: No significant edema PEG site wrapped with binder  Labs:  Reviewed  Resolved Hospital Problem list   Metabolic acidosis Hypertriglycerides being off propofol infusion Severe metabolic acidosis UTI  Assessment & Plan:  Acute  infectious encephalopathy in setting of viral meningoencephalitis.  Preceding hand/foot mouth.  CSF enterovirus + Critical cervical myopathy s/p ACDF C5-C7- makes repeat LP dangerous. She remains severely encephalopathic, not waking up; recovery could take weeks to months if it occurs.  Repeat MRI showing some progression which is concerning. Plan: Continue supportive care Hold sedation Remains on droplet precautions Steroid taper already in place. Continue to do what we can to support her.  After multidisciplinary discussion on 06/07/2022 the decision was made to continue supportive care through 06/14/2022 to see if there is any neurologic improvement.  If there is no neurologic improvement the consideration would be for transition to comfort care.  Dysphagia- s/p PEG, Plan: Continue tube feeds  Acute hypoxemic/hypercapnic respiratory failure 2/2 multilobar PNA s/p tracheostomy Leukamoid reaction vs. HCAP- improving Plan: Complete 5-day course of meropenem, already complete Ventilator at nighttime TCT during the day.  Tracheostomy site bleeding- resolved  Small lymphocytic B cell non-hodgkin lymphoma Hypogammaglobinemia- Patient is on Rituxan as an outpatient; last dose in June Plan: Continue supportive care  Status epilepticus- resolved but requiring multiple AEDs Plan: Continue Keppra, Vimpat, phenobarbital currently coming off. Transitioning off phenobarbital to help with drug interactions related to Eliquis. Continuous EEG per neuro.  DM2 with hypoglycemia Plan: Continue tube feeds plus Levemir, plus SSI with tube feed coverage.  Sudden onset shock, acute renal failure, acute liver failure, RV dilation presumed massive PE (7/29-7/30)- s/p tpa with good improvement in hemodynamics; now off presors Plan: Midodrine, heparin, CVVHD per nephrology Best Practice (right click and "Reselect all SmartList Selections" daily)   Diet/type: Tube feeds DVT prophylaxis: heparin gtt GI  prophylaxis: PPI Lines: PICC  Foley: recurrent retention due to neurological injury, continue foley for now Code Status:  full code Last date of multidisciplinary goals of care discussion: 8/8,  Possible palliative care transition on 06/14/2022.  This patient is critically ill with multiple organ system failure; which, requires frequent high complexity decision making, assessment, support, evaluation, and titration of therapies. This was completed through the application of advanced monitoring technologies and extensive interpretation of multiple databases. During this encounter critical care time was devoted to patient care services described in this note for 33 minutes.   Garner Nash, DO [facility] 06/12/2022 7:57 AM

## 2022-06-12 NOTE — Progress Notes (Signed)
[place] for heparin Indication: pulmonary embolus  Allergies  Allergen Reactions   Canagliflozin Other (See Comments)    recurrent vaginitis   Glucophage [Metformin] Other (See Comments)    GI intolerance   Niaspan [Niacin] Other (See Comments)    flushing    Patient Measurements: Height: 5' 5.98" (167.6 cm) Weight: 73.3 kg (161 lb 9.6 oz) IBW/kg (Calculated) : 59.25 Heparin Dosing Weight: 79kg  Vital Signs: Temp: 98.8 F (37.1 C) (08/13 0700) Temp Source: Rectal (08/13 0405) BP: 141/63 (08/13 0600) Pulse Rate: 99 (08/13 0700)  Labs: Recent Labs    06/10/22 0310 06/10/22 1623 06/11/22 0500 06/11/22 1636 06/12/22 0447  HGB 7.9*  --  7.9*  --   --   HCT 25.6*  --  26.5*  --   --   PLT 154  --  154  --   --   LABPROT 14.1  --  16.4*  --  17.7*  INR 1.1  --  1.3*  --  1.5*  HEPARINUNFRC 0.37  --  0.28*  --  0.32  CREATININE 0.71   < > 0.66 0.66 0.63   < > = values in this interval not displayed.     Estimated Creatinine Clearance: 69.9 mL/min (by C-G formula based on SCr of 0.63 mg/dL).   Assessment: 46 yoF admitted with meningitis from OSH. Pt with ongoing fevers and worsening clinical status 7/29, RV severely down on beside ECHO so concerns for acute PE. Pt received alteplase on 7/30 for suspected saddle PE.    Heparin level is therapeutic at 0.32, on heparin infusion at 1250 units/hr. INR is subtherapeutic at 1.5, wafarin started 8/10. Hgb remains low but stable at 7.9, plts stable at 154 on last check 8/12. No s/sx of bleeding or infusion issues. Discussed with CCM and will continue heparin until INR is therapeutic.   Goal of Therapy:  Heparin level 0.3-0.5 units/mL Monitor platelets by anticoagulation protocol: Yes   Plan:  Continue heparin infusion at 1250 units/hr Coumadin '5mg'$  PO again tonight Daily heparin level, INR, and CBC  Monitor for s/sx bleeding   Thank you for allowing pharmacy to participate in this  patient's care.  Antonietta Jewel, PharmD, [place] Clinical Pharmacist  Phone: [phone] 06/12/2022 7:15 AM  Please check AMION for all [facility] phone numbers After 10:00 PM, call [place] [phone]

## 2022-06-12 NOTE — Progress Notes (Signed)
EEG maint complete.  ?

## 2022-06-13 DIAGNOSIS — G039 Meningitis, unspecified: Secondary | ICD-10-CM | POA: Diagnosis not present

## 2022-06-13 DIAGNOSIS — R4182 Altered mental status, unspecified: Secondary | ICD-10-CM | POA: Diagnosis not present

## 2022-06-13 DIAGNOSIS — G40901 Epilepsy, unspecified, not intractable, with status epilepticus: Secondary | ICD-10-CM | POA: Diagnosis not present

## 2022-06-13 LAB — RENAL FUNCTION PANEL
Albumin: 2.5 g/dL — ABNORMAL LOW (ref 3.5–5.0)
Albumin: 2.5 g/dL — ABNORMAL LOW (ref 3.5–5.0)
Anion gap: 10 (ref 5–15)
Anion gap: 6 (ref 5–15)
BUN: 22 mg/dL (ref 8–23)
BUN: 25 mg/dL — ABNORMAL HIGH (ref 8–23)
CO2: 26 mmol/L (ref 22–32)
CO2: 27 mmol/L (ref 22–32)
Calcium: 8.4 mg/dL — ABNORMAL LOW (ref 8.9–10.3)
Calcium: 8.4 mg/dL — ABNORMAL LOW (ref 8.9–10.3)
Chloride: 101 mmol/L (ref 98–111)
Chloride: 105 mmol/L (ref 98–111)
Creatinine, Ser: 0.57 mg/dL (ref 0.44–1.00)
Creatinine, Ser: 0.52 mg/dL (ref 0.44–1.00)
GFR, Estimated: >60 mL/min (ref >60)
GFR, Estimated: >60 mL/min (ref >60)
Glucose, Bld: 167 mg/dL — ABNORMAL HIGH (ref 70–99)
Glucose, Bld: 201 mg/dL — ABNORMAL HIGH (ref 70–99)
Phosphorus: 2.4 mg/dL — ABNORMAL LOW (ref 2.5–4.6)
Phosphorus: 1.6 mg/dL — ABNORMAL LOW (ref 2.5–4.6)
Potassium: 4 mmol/L (ref 3.5–5.1)
Potassium: 4.5 mmol/L (ref 3.5–5.1)
Sodium: 137 mmol/L (ref 135–145)
Sodium: 138 mmol/L (ref 135–145)

## 2022-06-13 LAB — CBC
HCT: 29 % — ABNORMAL LOW (ref 36.0–46.0)
Hemoglobin: 8.9 g/dL — ABNORMAL LOW (ref 12.0–15.0)
MCH: 26.6 pg (ref 26.0–34.0)
MCHC: 30.7 g/dL (ref 30.0–36.0)
MCV: 86.8 fL (ref 80.0–100.0)
Platelets: 183 10*3/uL (ref 150–400)
RBC: 3.34 MIL/uL — ABNORMAL LOW (ref 3.87–5.11)
RDW: 23.9 % — ABNORMAL HIGH (ref 11.5–15.5)
WBC: 18.4 10*3/uL — ABNORMAL HIGH (ref 4.0–10.5)
nRBC: 0 % (ref 0.0–0.2)

## 2022-06-13 LAB — GLUCOSE, CAPILLARY
Glucose-Capillary: 154 mg/dL — ABNORMAL HIGH (ref 70–99)
Glucose-Capillary: 190 mg/dL — ABNORMAL HIGH (ref 70–99)
Glucose-Capillary: 186 mg/dL — ABNORMAL HIGH (ref 70–99)
Glucose-Capillary: 201 mg/dL — ABNORMAL HIGH (ref 70–99)
Glucose-Capillary: 161 mg/dL — ABNORMAL HIGH (ref 70–99)
Glucose-Capillary: 183 mg/dL — ABNORMAL HIGH (ref 70–99)

## 2022-06-13 LAB — MAGNESIUM: Magnesium: 2.4 mg/dL (ref 1.7–2.4)

## 2022-06-13 LAB — PROTIME-INR
INR: 1.9 — ABNORMAL HIGH (ref 0.8–1.2)
Prothrombin Time: 21.4 seconds — ABNORMAL HIGH (ref 11.4–15.2)

## 2022-06-13 LAB — HEPARIN LEVEL (UNFRACTIONATED): Heparin Unfractionated: 0.29 IU/mL — ABNORMAL LOW (ref 0.30–0.70)

## 2022-06-13 MED ORDER — WARFARIN SODIUM 5 MG PO TABS
5.0000 mg | ORAL_TABLET | Freq: Once | ORAL | Status: AC
Start: 2022-06-13 — End: 2022-06-13
  Administered 2022-06-13: 5 mg via ORAL
  Filled 2022-06-13: qty 1

## 2022-06-13 MED ORDER — ORAL CARE MOUTH RINSE
15.0000 mL | OROMUCOSAL | Status: DC
  Administered 2022-06-13 – 2022-06-23 (×120): 15 mL via OROMUCOSAL

## 2022-06-13 MED ORDER — ORAL CARE MOUTH RINSE: 15.0000 mL | OROMUCOSAL | Status: DC | PRN

## 2022-06-13 NOTE — Progress Notes (Signed)
ANTICOAGULATION CONSULT NOTE  Pharmacy Consult for heparin Indication: pulmonary embolus  Allergies  Allergen Reactions   Canagliflozin Other (See Comments)    recurrent vaginitis   Glucophage [Metformin] Other (See Comments)    GI intolerance   Niaspan [Niacin] Other (See Comments)    flushing    Patient Measurements: Height: 5' 5.98" (167.6 cm) Weight: 72.1 kg (158 lb 15.2 oz) IBW/kg (Calculated) : 59.25 Heparin Dosing Weight: 79kg  Vital Signs: Temp: 99.3 F (37.4 C) (08/14 0630) Temp Source: Rectal (08/14 0400) BP: 117/66 (08/14 0630) Pulse Rate: 104 (08/14 0630)  Labs: Recent Labs    06/11/22 0500 06/11/22 1636 06/12/22 0447 06/12/22 1459 06/13/22 0448 06/13/22 0451  HGB 7.9*  --   --   --  8.9*  --   HCT 26.5*  --   --   --  29.0*  --   PLT 154  --   --   --  183  --   LABPROT 16.4*  --  17.7*  --  21.4*  --   INR 1.3*  --  1.5*  --  1.9*  --   HEPARINUNFRC 0.28*  --  0.32  --   --  0.29*  CREATININE 0.66   < > 0.63 0.54 0.57  --    < > = values in this interval not displayed.     Estimated Creatinine Clearance: 69.4 mL/min (by C-G formula based on SCr of 0.57 mg/dL).   Assessment: 76 yoF admitted with meningitis from OSH. Pt with ongoing fevers and worsening clinical status 7/29, RV severely down on beside ECHO so concerns for acute PE. Pt received alteplase on 7/30 for suspected saddle PE.    Heparin level is subtherapeutic at 0.29, on heparin infusion at 1250 units/hr. INR is subtherapeutic at 1.9, wafarin started 8/10. Hgb has improved to 8.9, plts stable at 183. No s/sx of bleeding or infusion issues. Discussed with CCM and will stop heparin given INR nearly at goal.   Goal of Therapy:  Heparin level 0.3-0.5 units/mL Monitor platelets by anticoagulation protocol: Yes   Plan:  Stop heparin infusion  Coumadin '5mg'$  PO again tonight Daily heparin level, INR, and CBC  Monitor for s/sx bleeding   Thank you for allowing pharmacy to participate in  this patient's care.  Reatha Harps, PharmD PGY2 Pharmacy Resident 06/13/2022 9:06 AM Check [email or web address] for unit specific pharmacy number

## 2022-06-13 NOTE — Progress Notes (Signed)
Performed maintenance on all leads.  All under 10.  Minor skin breakdown on reference.

## 2022-06-13 NOTE — Progress Notes (Signed)
RT placed patient on 8L/35% ATC at this time. Pt tolerating settings well. No respiratory distress noted. RT will continue to monitor.

## 2022-06-13 NOTE — Progress Notes (Signed)
RT placed pt back on ventilator due to spo2 decreased to mid 80s and RR>35. Pt is tolerating ventilator settings well. RT will continue to monitor.

## 2022-06-13 NOTE — Progress Notes (Signed)
RT placed pt on ATC 8L/35% at this time. Pt tolerating ATC well. No respiratory distress noted. RT will continue to monitor.

## 2022-06-13 NOTE — Procedures (Signed)
MRN: [id number]  Epilepsy Attending: Lora Havens  Referring Physician/Provider: Greta Doom, MD   Duration: 06/12/2022 1014 to 06/13/2022 1014   Patient history: 65yo F with ams. EEG to evaluate for seizure   Level of alertness: comatose   AEDs during EEG study: Keppra, Vimpat, Perampanel, Onfi   Technical aspects: This EEG study was done with scalp electrodes positioned according to the 10-20 International system of electrode placement. Electrical activity was acquired at a sampling rate of '500Hz'$  and reviewed with a high frequency filter of '70Hz'$  and a low frequency filter of '1Hz'$ . EEG data were recorded continuously and digitally stored.    Description: EEG showed near continuous generalized 5 to 9 Hz theta-alpha activity admixed with 12 to 14 Hz generalized beta activity.  Continuous 3 to 5 Hz theta-delta slowing was also noted in left posterior quadrant. Polyspikes were noted in left posterior quadrant which at times appeared quasiperiodic  at 1 Hz.  Note, parts of study were difficult to evaluate due to significant electrode artifact.   ABNORMALITY -Polyspikes, left posterior quadrant -Continuous slow, generalized and maximal left posterior quadrant   IMPRESSION: This difficult study showed evidence of epileptogenicity and cortical dysfunction in left posterior quadrant likely due to underlying structural abnormality. Additionally there was moderate to severe diffuse encephalopathy, nonspecific etiology. No definite seizures were seen during this study.   Viera Okonski [name]

## 2022-06-13 NOTE — Progress Notes (Signed)
[facility] Progress Note   Assessment/ Plan:   ARF, oliguric- b/l creat 0.8 on admission. Creat rising now in setting of new onset shock. Pt admitted for viral meningoencephalitis complicated by critical cervical myopathy and now resp failure w/ multifocal PNA. Pt has lymphoma receiving active chemoRx (rituximab) w/ last dose in June 2023. AKI due to shock which started in last 24 hrs abruptly. CCM suspected acute PE, s/p TPA and now on IV heparin. K+ up to 6.8 -> CCRT 7/30 w/ all 2K fluids -> 4K now. No signs of renal recovery. Very minimal UOP.   continue with current CRRT orders CRRT stopped early 06/06/22, didn't tolerate off CRRT.  Resumed CRRT 06/07/22 and will continue until family meeting this week.   Continuing CRRT to give her every opportunity to improve her neurologic condition before deciding on transition to comfort care.  If no neurological improvement, agree with recommendation to transition to comfort care after a time-limited trial of aggressive care.     Volume - keep even Hypophosphatemia - started on neutra-phos with tube feeds but may need IV K phos if phos continues to drop.  Shock - maybe from multilobar PNA + viral menigoencephalitis, as above, on vasopressor support,  H/o lymphoma - getting active chemoRx w/ last dose rituxan in June 2023 AHRF - s/p trach, on TC now AMS - in setting of viral meningoencephalitis.  Remains unresponsive despite being off of sedation for over 96 hours and progressive changes seen on MRI.  Neurology and ID following.  on cEEG Cervical myelopathy  DM2 - on insulin Disposition - remains unresponsive off of sedation for several days.  After multidisciplinary discussion, decision was to continue with aggressive care for a time limited trial.  If she has no improvement, will recommend transitioning to comfort care.  Subjective:    Seen in room.  No overall improvement.  Pt's daughter at bedside.     Objective:   BP (!) 117/58    Pulse (!) 105   Temp 99.1 F (37.3 C)   Resp (!) 31   Ht 5' 5.98" (1.676 m)   Wt 72.1 kg   SpO2 96%   BMI 25.67 kg/m   Intake/Output Summary (Last 24 hours) at 06/13/2022 0955 Last data filed at 06/13/2022 0900 Gross per 24 hour  Intake 2928.59 ml  Output 4267 ml  Net -1338.41 ml   Weight change: -1.2 kg  Physical Exam: [id number], on TC CVS: RRR Resp: clear Abd: soft Ext: no LE edema ACCESS: IJ HD cath  Imaging: No results found.  Labs: BMET Recent Labs  Lab 06/10/22 0310 06/10/22 1623 06/11/22 0500 06/11/22 1636 06/12/22 0447 06/12/22 1459 06/13/22 0448  NA 135 138 138 137 138 136 137  K 3.9 5.0 4.0 4.7 4.0 5.0 4.0  CL 102 106 106 104 102 102 101  CO2 '25 25 27 25 26 27 26  '$ GLUCOSE 235* 255* 109* 216* 186* 200* 167*  BUN 29* 33* 28* 30* 27* 26* 22  CREATININE 0.71 0.74 0.66 0.66 0.63 0.54 0.57  CALCIUM 8.0* 8.0* 8.2* 7.8* 8.4* 8.4* 8.4*  PHOS 2.6 3.2 1.9* 3.0 2.3* 2.9 2.4*   CBC Recent Labs  Lab 06/09/22 0329 06/10/22 0310 06/11/22 0500 06/13/22 0448  WBC 10.2 11.2* 13.8* 18.4*  HGB 7.7* 7.9* 7.9* 8.9*  HCT 24.2* 25.6* 26.5* 29.0*  MCV 80.7 85.6 87.2 86.8  PLT 178 154 154 183    Medications:     Chlorhexidine Gluconate Cloth  6 each  Topical Daily   docusate  100 mg Per Tube BID   fentaNYL (SUBLIMAZE) injection  200 mcg Intravenous Once   insulin aspart  0-20 Units Subcutaneous Q4H   insulin aspart  8 Units Subcutaneous Q4H   insulin detemir  25 Units Subcutaneous BID   midazolam  5 mg Intravenous Once   midodrine  10 mg Per Tube TID WC   multivitamin  1 tablet Per Tube QHS   mouth rinse  15 mL Mouth Rinse Q2H   pantoprazole  40 mg Per Tube Daily   polyethylene glycol  17 g Per Tube Daily   sodium chloride flush  10-40 mL Intracatheter Q12H   warfarin  5 mg Oral ONCE-1600   Warfarin - Pharmacist Dosing Inpatient   Does not apply [id number]    Madelon Lips, MD 06/13/2022, 9:55 AM

## 2022-06-13 NOTE — Progress Notes (Signed)
Subjective: No acute events overnight.  Continues to be on CRRT. Per daughter at bedside, patient did open her eyes and looked at her but didn't track when she moved to right side.   ROS: Unable to obtain due to poor mental status  Examination  Vital signs in last 24 hours: Temp:  [97 F (36.1 C)-99.5 F (37.5 C)] 98.8 F (37.1 C) (08/14 1000) Pulse Rate:  [75-112] 107 (08/14 1000) Resp:  [18-36] 25 (08/14 1000) BP: (101-160)/(47-74) 134/71 (08/14 1000) SpO2:  [88 %-100 %] 97 % (08/14 1000) FiO2 (%):  [35 %-60 %] 35 % (08/14 0841) Weight:  [72.1 kg] 72.1 kg (08/14 0500)  General: lying in bed, NAD Neuro:  comatose, pupils 57m equal and reactive to light, corneal reflex present, winces to noxious stimuli in all extremities  Basic Metabolic Panel: Recent Labs  Lab 06/09/22 0329 06/09/22 1649 06/10/22 0310 06/10/22 1623 06/11/22 0500 06/11/22 1636 06/12/22 0447 06/12/22 1459 06/13/22 0448  NA 138   < > 135   < > 138 137 138 136 137  K 3.0*   < > 3.9   < > 4.0 4.7 4.0 5.0 4.0  CL 103   < > 102   < > 106 104 102 102 101  CO2 26   < > 25   < > '27 25 26 27 26  '$ GLUCOSE 67*   < > 235*   < > 109* 216* 186* 200* 167*  BUN 37*   < > 29*   < > 28* 30* 27* 26* 22  CREATININE 0.91   < > 0.71   < > 0.66 0.66 0.63 0.54 0.57  CALCIUM 7.8*   < > 8.0*   < > 8.2* 7.8* 8.4* 8.4* 8.4*  MG 2.5*  --  2.5*  --  2.4  --  2.3  --  2.4  PHOS 3.7   < > 2.6   < > 1.9* 3.0 2.3* 2.9 2.4*   < > = values in this interval not displayed.    CBC: Recent Labs  Lab 06/08/22 0342 06/09/22 0329 06/10/22 0310 06/11/22 0500 06/13/22 0448  WBC 12.9* 10.2 11.2* 13.8* 18.4*  HGB 7.4* 7.7* 7.9* 7.9* 8.9*  HCT 23.7* 24.2* 25.6* 26.5* 29.0*  MCV 80.6 80.7 85.6 87.2 86.8  PLT 169 178 154 154 183     Coagulation Studies: Recent Labs    06/11/22 0500 06/12/22 0447 06/13/22 0448  LABPROT 16.4* 17.7* 21.4*  INR 1.3* 1.5* 1.9*    Imaging  CT head without contrast 06/10/2022:  1. Poor visualization  of the parietal and occipital sulci, similar to the prior exam, which is consistent with the abnormal CSF signal and suspected meningitis seen on the 06/06/2022 MRI, although not as extensive. No definite extra-axial collection. 2. Overall unchanged ventricular size compared to 06/01/2022, although these have increased in size compared to 05/20/2022 and remain concerning for communicating hydrocephalus.  ASSESSMENT AND PLAN: 66year old female with suspected viral meningo-encephalitis (had hand-foot-and-mouth disease prior to presentation) now with status epilepticus.   Focal convulsive status epilepticus, resolved Provoked seizures Acute encephalopathy, infectious Entero-Viral meningo-encephalitis Left meningioma Acute ischemic stroke, incidental Obstructive hydrocephalus Immunocompromised  History of small lymphocytic like B-cell non-Hodgkin lymphoma -Patient had exposure to hand-foot-and-mouth disease and subsequently enterovirus encephalitis.   -Status epilepticus likely due to viral encephalitis, meningioma (meningiomas do not commonly cause status epilepticus but the location of this meningioma is concerning).   -Ischemic strokes and likely due to encephalitis   Recommendations -  Continue Keppra 1 g twice daily, Vimpat 200 mg twice daily -Stop Onfi and perampanel to minimize sedation. They both have long half-life of about 40 hours so hopefully they will slowly taper off and we can get a better exam on Wednesday to help with goals of care discussion -Continue LTM EEG to look for intermittent seizures -Discussed plan with Dr. Valeta Harms via secure chat and daughter at bedside -Continue seizure precautions   I have spent a total of 40 minutes with the patient reviewing hospital notes,  test results, labs and examining the patient as well as establishing an assessment and plan.  > 50% of time was spent in direct patient care.    Zeb Comfort Epilepsy [facility] For  questions after 5pm please refer to AMION to reach the Neurologist on call

## 2022-06-13 NOTE — Progress Notes (Signed)
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   Discharge summary reviewed: She initially presented to OSH on 05/20/22 for left upper extremity tremors. She reported recent hand foot any mouth disease contracted from her grandchild that left her weak and tired prior to this admission. Neurology work-up including MRI, EEG negative. MRI/MRA neg for acute infarct, 13 mm focus in left frontal lobe representing artifact vs small calcified meningioma, chronic small vessel ischemic changes, cerebral atrophy, high-grade stenosis in bilateral ICA, focal stenosis in left ACA and intracranial right vertebral artery. MRI of C-spine showing C4-5 cord compression from advanced disc degeneration with posterior endplate ridging.  Showing left foraminal impingement at the same level.  Also showing C5-6 and C6-7 ADCF with solid arthrodesis.  Neurologist at [facility] had recommended neurosurgical evaluation.  She developed fevers and transferred to ICU concerning for meningitis and started on Vanc, rocephin, ampicillin and acyclovir. LP was performed on 7/24 with WBC 55 RBC 2. Pending herpes, enterovirus, ehrlichia. CT A/P showed bilateral ground glass and lung opacities concerning for multifocal pneumonia.   PCCM consulted for decreased responsiveness and hypoxemia on NRB. On exam obtunded and only able to say name. O2 sats in the 70s on NRB. Transferred to the ICU for emergent intubation   Pertinent  Medical History  As above  Significant Hospital Events: Including procedures,  antibiotic start and stop dates in addition to other pertinent events   7/24 Admitted to [facility] from [place]. Transferred to PCCM when found in respiratory failure 7/25 intubated, bronched, art line placed, PICC placed.  Neuro and ID consulted. Started decadron  7/26 decadron dc/d 7/27 felt most consistent with viral meningoencephalitis  7/29-7/30 sudden onset multiorgan failure; given tpa with improvement 8/4 trach 8/6 trach bleeding 8/12 off sedation awaiting any neurologic response.   Interim History / Subjective:   Patient remains critically ill trach collar in place.  She has not had any significant neurologic recovery.  She responds to deep suctioning.  Otherwise no response to pain.  Objective   Blood pressure 122/63, pulse (!) 103, temperature 99.3 F (37.4 C), resp. rate (!) 29, height 5' 5.98" (1.676 m), weight 72.1 kg, SpO2 99 %. CVP:  [0 mmHg-12 mmHg] 0 mmHg  Vent Mode: PRVC FiO2 (%):  [40 %-60 %] 50 % Set Rate:  [18 bmp] 18 bmp Vt Set:  [480 mL] 480 mL PEEP:  [8 cmH20] 8 cmH20 Plateau Pressure:  [21 cmH20-33 cmH20] 21 cmH20   Intake/Output Summary (Last 24 hours) at 06/13/2022 0831 Last data filed at 06/13/2022 0700 Gross per 24 hour  Intake 2822.86 ml  Output 4307 ml  Net -1484.14 ml   Filed Weights   06/11/22 0500 06/12/22 0446 06/13/22 0500  Weight: 75.4 kg 73.3 kg 72.1 kg   Physical Exam: Gen: Elderly female, tracheostomy tube in place HEENT: Tracheostomy tube in place no significant secretions Heart: Regular rate rhythm, S1-S2 Lungs: B bilateral ventilated breath sounds Extremities: No significant edema Abdomen: PEG site wrapped with abdominal binder Labs: Reviewed  Resolved Hospital Problem list   Metabolic acidosis Hypertriglycerides being off propofol  infusion Severe metabolic acidosis UTI  Assessment & Plan:  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  Preceding hand/foot mouth.  CSF enterovirus + Critical cervical  myopathy s/p ACDF C5-C7- makes repeat LP dangerous. She remains severely encephalopathic, not waking up; recovery could take weeks to months if it occurs.  Repeat MRI showing some progression which is concerning. Plan: At this point we are continuing ICU supportive care Remains on droplet precautions Steroid taper already started. After multidisciplinary discussion on 06/07/2022 the family had decided to continue supportive care in the intensive care unit requiring CVVHD and vent support through 06/14/2022.  If there is no neurologic recovery then we can consider transition to comfort care.  Dysphagia- s/p PEG, Plan: Continue tube feeds  Acute hypoxemic/hypercapnic respiratory failure 2/2 multilobar PNA s/p tracheostomy Leukamoid reaction vs. HCAP- improving Plan: Has already completed 5-day course of meropenem Ventilator support at nighttime Transition to TCT during the day as tolerated.  Tracheostomy site bleeding- resolved  Small lymphocytic B cell non-hodgkin lymphoma Hypogammaglobinemia- Patient is on Rituxan as an outpatient; last dose in June Plan: Supportive care  Status epilepticus- resolved but requiring multiple AEDs Plan: Continue Keppra, Vimpat Weaning off phenobarbital EEG per neurology  DM2 with hypoglycemia Plan: Continue tube feeds Continue Levemir plus SSI and tube feed coverage  Sudden onset shock, acute renal failure, acute liver failure, RV dilation presumed massive PE (7/29-7/30)- s/p tpa with good improvement in hemodynamics; now off presors Plan: Midodrine, heparin, CVVHD continued per nephrology.   Diet/type: Tube feeds DVT prophylaxis: heparin gtt GI prophylaxis: PPI Lines: PICC  Foley: recurrent retention due to neurological injury, continue foley for now Code Status:  full code Last date of multidisciplinary goals of care discussion: 8/8,  Possible palliative care transition on 06/14/2022.  This patient is critically ill with multiple organ  system failure; which, requires frequent high complexity decision making, assessment, support, evaluation, and titration of therapies. This was completed through the application of advanced monitoring technologies and extensive interpretation of multiple databases. During this encounter critical care time was devoted to patient care services described in this note for 32 minutes.   Garner Nash, DO [facility] 06/13/2022 8:31 AM

## 2022-06-14 ENCOUNTER — Encounter (HOSPITAL_COMMUNITY): Payer: Self-pay

## 2022-06-14 DIAGNOSIS — G9341 Metabolic encephalopathy: Secondary | ICD-10-CM | POA: Diagnosis not present

## 2022-06-14 DIAGNOSIS — J9601 Acute respiratory failure with hypoxia: Secondary | ICD-10-CM | POA: Diagnosis not present

## 2022-06-14 DIAGNOSIS — R4182 Altered mental status, unspecified: Secondary | ICD-10-CM | POA: Diagnosis not present

## 2022-06-14 DIAGNOSIS — G40901 Epilepsy, unspecified, not intractable, with status epilepticus: Secondary | ICD-10-CM | POA: Diagnosis not present

## 2022-06-14 DIAGNOSIS — G039 Meningitis, unspecified: Secondary | ICD-10-CM | POA: Diagnosis not present

## 2022-06-14 LAB — RENAL FUNCTION PANEL
Albumin: 2.5 g/dL — ABNORMAL LOW (ref 3.5–5.0)
Albumin: 2.6 g/dL — ABNORMAL LOW (ref 3.5–5.0)
Anion gap: 6 (ref 5–15)
Anion gap: 7 (ref 5–15)
BUN: 25 mg/dL — ABNORMAL HIGH (ref 8–23)
BUN: 26 mg/dL — ABNORMAL HIGH (ref 8–23)
CO2: 28 mmol/L (ref 22–32)
CO2: 28 mmol/L (ref 22–32)
Calcium: 8.5 mg/dL — ABNORMAL LOW (ref 8.9–10.3)
Calcium: 8.6 mg/dL — ABNORMAL LOW (ref 8.9–10.3)
Chloride: 104 mmol/L (ref 98–111)
Chloride: 103 mmol/L (ref 98–111)
Creatinine, Ser: 0.54 mg/dL (ref 0.44–1.00)
Creatinine, Ser: 0.44 mg/dL (ref 0.44–1.00)
GFR, Estimated: >60 mL/min (ref >60)
GFR, Estimated: >60 mL/min (ref >60)
Glucose, Bld: 168 mg/dL — ABNORMAL HIGH (ref 70–99)
Glucose, Bld: 179 mg/dL — ABNORMAL HIGH (ref 70–99)
Phosphorus: 1.4 mg/dL — ABNORMAL LOW (ref 2.5–4.6)
Phosphorus: 1.9 mg/dL — ABNORMAL LOW (ref 2.5–4.6)
Potassium: 4.4 mmol/L (ref 3.5–5.1)
Potassium: 4.8 mmol/L (ref 3.5–5.1)
Sodium: 138 mmol/L (ref 135–145)
Sodium: 138 mmol/L (ref 135–145)

## 2022-06-14 LAB — GLUCOSE, CAPILLARY
Glucose-Capillary: 162 mg/dL — ABNORMAL HIGH (ref 70–99)
Glucose-Capillary: 111 mg/dL — ABNORMAL HIGH (ref 70–99)
Glucose-Capillary: 103 mg/dL — ABNORMAL HIGH (ref 70–99)
Glucose-Capillary: 152 mg/dL — ABNORMAL HIGH (ref 70–99)
Glucose-Capillary: 258 mg/dL — ABNORMAL HIGH (ref 70–99)
Glucose-Capillary: 267 mg/dL — ABNORMAL HIGH (ref 70–99)
Glucose-Capillary: 324 mg/dL — ABNORMAL HIGH (ref 70–99)

## 2022-06-14 LAB — PROTIME-INR
INR: 2.2 — ABNORMAL HIGH (ref 0.8–1.2)
Prothrombin Time: 23.9 seconds — ABNORMAL HIGH (ref 11.4–15.2)

## 2022-06-14 LAB — MAGNESIUM: Magnesium: 2.4 mg/dL (ref 1.7–2.4)

## 2022-06-14 LAB — HEPARIN LEVEL (UNFRACTIONATED): Heparin Unfractionated: <0.1 IU/mL — ABNORMAL LOW (ref 0.30–0.70)

## 2022-06-14 MED ORDER — INSULIN ASPART 100 UNIT/ML IJ SOLN
4.0000 [IU] | INTRAMUSCULAR | Status: DC
  Administered 2022-06-14 – 2022-06-15 (×4): 4 [IU] via SUBCUTANEOUS

## 2022-06-14 MED ORDER — WARFARIN SODIUM 5 MG PO TABS
5.0000 mg | ORAL_TABLET | Freq: Once | ORAL | Status: AC
  Administered 2022-06-14: 5 mg via ORAL
  Filled 2022-06-14: qty 1

## 2022-06-14 NOTE — Progress Notes (Addendum)
Subjective: Opening eyes and following  one step commands like sticking out her tongue  ROS: Unable to obtain due to poor mental status  Examination  Vital signs in last 24 hours: Temp:  [97.6 F (36.4 C)-99.1 F (37.3 C)] 97.6 F (36.4 C) (08/15 1200) Pulse Rate:  [79-105] 97 (08/15 1400) Resp:  [19-31] 23 (08/15 1400) BP: (95-168)/(52-75) 122/62 (08/15 1400) SpO2:  [95 %-99 %] 98 % (08/15 1400) FiO2 (%):  [35 %-40 %] 40 % (08/15 0800) Weight:  [64.8 kg] 64.8 kg (08/15 0600)  General: lying in bed, NAD Neuro:  awake, pupils 21m equal and reactive to light, blinking, let gaze preference but does cross midline to right, follows 1 step commands, winces to noxious stimuli in Left>right extremities  Basic Metabolic Panel: Recent Labs  Lab 06/10/22 0310 06/10/22 1623 06/11/22 0500 06/11/22 1636 06/12/22 0447 06/12/22 1459 06/13/22 0448 06/13/22 1655 06/14/22 0441  NA 135   < > 138   < > 138 136 137 138 138  K 3.9   < > 4.0   < > 4.0 5.0 4.0 4.5 4.4  CL 102   < > 106   < > 102 102 101 105 104  CO2 25   < > 27   < > '26 27 26 27 28  '$ GLUCOSE 235*   < > 109*   < > 186* 200* 167* 201* 168*  BUN 29*   < > 28*   < > 27* 26* 22 25* 25*  CREATININE 0.71   < > 0.66   < > 0.63 0.54 0.57 0.52 0.54  CALCIUM 8.0*   < > 8.2*   < > 8.4* 8.4* 8.4* 8.4* 8.5*  MG 2.5*  --  2.4  --  2.3  --  2.4  --  2.4  PHOS 2.6   < > 1.9*   < > 2.3* 2.9 2.4* 1.6* 1.4*   < > = values in this interval not displayed.    CBC: Recent Labs  Lab 06/08/22 0342 06/09/22 0329 06/10/22 0310 06/11/22 0500 06/13/22 0448  WBC 12.9* 10.2 11.2* 13.8* 18.4*  HGB 7.4* 7.7* 7.9* 7.9* 8.9*  HCT 23.7* 24.2* 25.6* 26.5* 29.0*  MCV 80.6 80.7 85.6 87.2 86.8  PLT 169 178 154 154 183     Coagulation Studies: Recent Labs    06/12/22 0447 06/13/22 0448 06/14/22 0441  LABPROT 17.7* 21.4* 23.9*  INR 1.5* 1.9* 2.2*    Imaging No new brain imaging.   ASSESSMENT AND PLAN: 66year old female with suspected viral  meningo-encephalitis (had hand-foot-and-mouth disease prior to presentation) now with status epilepticus.   Focal convulsive status epilepticus, resolved Provoked seizures Acute encephalopathy, infectious Entero-Viral meningo-encephalitis Left meningioma Acute ischemic stroke, incidental Obstructive hydrocephalus Immunocompromised  History of small lymphocytic like B-cell non-Hodgkin lymphoma -Patient had exposure to hand-foot-and-mouth disease and subsequently enterovirus encephalitis.   -Status epilepticus likely due to viral encephalitis, meningioma (meningiomas do not commonly cause status epilepticus but the location of this meningioma is concerning).   -Ischemic strokes and likely due to encephalitis   Recommendations -Continue Keppra 1 g twice daily, Vimpat 200 mg twice daily -Continue LTM EEG to look for intermittent seizures. If mental status continues to improve, will likely dc tomorrow -Discussed plan with daughter and husband at bedside -Continue seizure precautions   I have spent a total of 38 minutes with the patient reviewing hospital notes,  test results, labs and examining the patient as well as establishing an assessment and plan.  >  50% of time was spent in direct patient care.   Zeb Comfort Epilepsy [facility] For questions after 5pm please refer to AMION to reach the Neurologist on call

## 2022-06-14 NOTE — Progress Notes (Addendum)
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   Discharge summary reviewed: She initially presented to OSH on 05/20/22 for left upper extremity tremors. She reported recent hand foot any mouth disease contracted from her grandchild that left her weak and tired prior to this admission. Neurology work-up including MRI, EEG negative. MRI/MRA neg for acute infarct, 13 mm focus in left frontal lobe representing artifact vs small calcified meningioma, chronic small vessel ischemic changes, cerebral atrophy, high-grade stenosis in bilateral ICA, focal stenosis in left ACA and intracranial right vertebral artery. MRI of C-spine showing C4-5 cord compression from advanced disc degeneration with posterior endplate ridging.  Showing left foraminal impingement at the same level.  Also showing C5-6 and C6-7 ADCF with solid arthrodesis.  Neurologist at [facility] had recommended neurosurgical evaluation.  She developed fevers and transferred to ICU concerning for meningitis and started on Vanc, rocephin, ampicillin and acyclovir. LP was performed on 7/24 with WBC 55 RBC 2. Pending herpes, enterovirus, ehrlichia. CT A/P showed bilateral ground glass and lung opacities concerning for multifocal pneumonia.   PCCM consulted for decreased responsiveness and hypoxemia on NRB. On exam obtunded and only able to say name. O2 sats in the 70s on NRB. Transferred to the ICU for emergent intubation   Pertinent  Medical History  As above  Significant Hospital Events: Including procedures,  antibiotic start and stop dates in addition to other pertinent events   7/24 Admitted to [facility] from [place]. Transferred to PCCM when found in respiratory failure 7/25 intubated, bronched, art line placed, PICC placed.  Neuro and ID consulted. Started decadron  7/26 decadron dc/d 7/27 felt most consistent with viral meningoencephalitis  7/29-7/30 sudden onset multiorgan failure; given tpa with improvement 8/4 trach 8/6 trach bleeding 8/12 off sedation awaiting any neurologic response.   Interim History / Subjective:   Has done some ATC, MV overnight 0.40, PEEP 8 Not on any continuous sedation  Objective   Blood pressure 136/67, pulse 98, temperature 98.2 F (36.8 C), resp. rate (!) 30, height 5' 5.98" (1.676 m), weight 64.8 kg, SpO2 97 %. CVP:  [0 mmHg-8 mmHg] 6 mmHg  Vent Mode: PRVC FiO2 (%):  [35 %-40 %] 40 % Set Rate:  [18 bmp] 18 bmp Vt Set:  [480 mL] 480 mL PEEP:  [8 cmH20] 8 cmH20 Plateau Pressure:  [21 cmH20-23 cmH20] 21 cmH20   Intake/Output Summary (Last 24 hours) at 06/14/2022 0736 Last data filed at 06/14/2022 0700 Gross per 24 hour  Intake 2307.21 ml  Output 2455 ml  Net -147.79 ml   Filed Weights   06/12/22 0446 06/13/22 0500 06/14/22 0600  Weight: 73.3 kg 72.1 kg 64.8 kg   Physical Exam: Gen: Ill-appearing woman, diaphoretic.  Ventilated HEENT: Trach in place, no secretions, stoma looks good Heart: Regular, distant, no murmur Lungs: Few scattered rhonchi, otherwise clear Extremities: No edema Abdomen: Binder in place, PEG in place Neuro: Patient tracks, followed commands-was able to close eyes, stick out tongue to command.  Globally weak.  Did not move extremities on my request  Resolved Hospital Problem list  Metabolic acidosis Hypertriglycerides being off propofol infusion Severe metabolic acidosis UTI  Assessment & Plan:  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  Preceding hand/foot mouth.  CSF enterovirus + Critical  cervical myopathy s/p ACDF C5-C7- makes repeat LP dangerous. She remains severely encephalopathic, not waking up; recovery could take weeks to months if it occurs.  Repeat MRI showing some progression which is concerning. Plan: -Tapering corticosteroids -Continues to receive full medical care, ICU care.  No improvement in neurological status.  Family contemplating transition to comfort care, possibly 8/15 -Droplet precautions  Dysphagia- s/p PEG, Plan: -Tube feeding  Acute hypoxemic/hypercapnic respiratory failure 2/2 multilobar PNA s/p tracheostomy Leukamoid reaction vs. HCAP- improving Plan: -ATC during the day as able to tolerate, back to MV nightly -Has completed empiric course 5 days of meropenem  Tracheostomy site bleeding- resolved  Small lymphocytic B cell non-hodgkin lymphoma Hypogammaglobinemia- Patient is on Rituxan as an outpatient; last dose in June Plan: -Supportive care -Follow CBC  Status epilepticus- resolved but requiring multiple AEDs Plan: -Maintain Keppra, Vimpat -Phenobarbital weaned off  DM2 with hypoglycemia Plan: -Levemir, tube feed coverage -Sign scale insulin  Sudden onset shock, acute renal failure, acute liver failure, RV dilation presumed massive PE (7/29-7/30)- s/p tpa with good improvement in hemodynamics; now off presors Plan: -Heparin transitioned to warfarin -Midodrine scheduled  Acute renal failure -Appreciate nephrology management -Continue CVVHD.  Hemodynamically improved, question whether we can have a break from HD, transition to intermittent HD if needed.   Diet/type: Tube feeds DVT prophylaxis: Warfarin GI prophylaxis: PPI Lines: PICC  Foley: recurrent retention due to neurological injury, continue foley for now Code Status:  full code Last date of multidisciplinary goals of care discussion: 8/8, Family: Husband updated at bedside 8/15  This patient is critically ill with multiple organ system failure; which, requires  frequent high complexity decision making, assessment, support, evaluation, and titration of therapies. This was completed through the application of advanced monitoring technologies and extensive interpretation of multiple databases. During this encounter critical care time was devoted to patient care services described in this note for 32 minutes.  Baltazar Apo, MD, PhD 06/14/2022, 7:36 AM [facility] [phone] or if no answer before 7:00PM call [phone] For any issues after 7:00PM please call eLink [phone]

## 2022-06-14 NOTE — Progress Notes (Signed)
vLTM maintenance  all impedances below 10kohms  Skin is oily due to prolonged hospital stay.   No skin irritation noted at P4  A2  C3 C4   F4

## 2022-06-14 NOTE — Progress Notes (Signed)
[facility] Progress Note   Assessment/ Plan:   ARF, oliguric- b/l creat 0.8 on admission. Creat rising now in setting of new onset shock. Pt admitted for viral meningoencephalitis complicated by critical cervical myopathy and now resp failure w/ multifocal PNA. Pt has lymphoma receiving active chemoRx (rituximab) w/ last dose in June 2023. AKI due to shock which started in last 24 hrs abruptly. CCM suspected acute PE, s/p TPA and now on IV heparin. K+ up to 6.8 -> CCRT 7/30 w/ all 2K fluids -> 4K now. No signs of renal recovery.  - Neurologic status improved  - not on pressors and BP adequate  - will stop CRRT this evening and will transition to IHD  Volume - keep even Hypophosphatemia - started on neutra-phos with tube feeds but may need IV K phos if phos continues to drop.  Shock - maybe from multilobar PNA + viral menigoencephalitis, as above, on vasopressor support,  H/o lymphoma - getting active chemoRx w/ last dose rituxan in June 2023 AHRF - s/p trach, on TC now AMS - in setting of viral meningoencephalitis.  Remains unresponsive despite being off of sedation for over 96 hours and progressive changes seen on MRI.  Neurology and ID following.  on cEEG Cervical myelopathy  DM2 - on insulin Disposition in ICU, some evidence of improved neurologic status today  Subjective:    Opens eyes today and is tracking.  Smiled at her husband.       Objective:   BP 137/68 (BP Location: Right Arm)   Pulse 92   Temp 97.6 F (36.4 C) (Axillary)   Resp (!) 27   Ht 5' 5.98" (1.676 m)   Wt 64.8 kg   SpO2 97%   BMI 23.07 kg/m   Intake/Output Summary (Last 24 hours) at 06/14/2022 1145 Last data filed at 06/14/2022 1100 Gross per 24 hour  Intake 2168.37 ml  Output 2517 ml  Net -348.63 ml   Weight change: -7.3 kg  Physical Exam: [id number] a gave a brief smile to husband, on TC CVS: RRR Resp: clear Abd: soft Ext: no LE edema ACCESS: IJ HD cath  Imaging: No results  found.  Labs: BMET Recent Labs  Lab 06/11/22 0500 06/11/22 1636 06/12/22 0447 06/12/22 1459 06/13/22 0448 06/13/22 1655 06/14/22 0441  NA 138 137 138 136 137 138 138  K 4.0 4.7 4.0 5.0 4.0 4.5 4.4  CL 106 104 102 102 101 105 104  CO2 '27 25 26 27 26 27 28  '$ GLUCOSE 109* 216* 186* 200* 167* 201* 168*  BUN 28* 30* 27* 26* 22 25* 25*  CREATININE 0.66 0.66 0.63 0.54 0.57 0.52 0.54  CALCIUM 8.2* 7.8* 8.4* 8.4* 8.4* 8.4* 8.5*  PHOS 1.9* 3.0 2.3* 2.9 2.4* 1.6* 1.4*   CBC Recent Labs  Lab 06/09/22 0329 06/10/22 0310 06/11/22 0500 06/13/22 0448  WBC 10.2 11.2* 13.8* 18.4*  HGB 7.7* 7.9* 7.9* 8.9*  HCT 24.2* 25.6* 26.5* 29.0*  MCV 80.7 85.6 87.2 86.8  PLT 178 154 154 183    Medications:     Chlorhexidine Gluconate Cloth  6 each Topical Daily   docusate  100 mg Per Tube BID   fentaNYL (SUBLIMAZE) injection  200 mcg Intravenous Once   insulin aspart  0-20 Units Subcutaneous Q4H   insulin aspart  8 Units Subcutaneous Q4H   insulin detemir  25 Units Subcutaneous BID   midazolam  5 mg Intravenous Once   midodrine  10 mg Per Tube TID WC  multivitamin  1 tablet Per Tube QHS   mouth rinse  15 mL Mouth Rinse Q2H   pantoprazole  40 mg Per Tube Daily   polyethylene glycol  17 g Per Tube Daily   sodium chloride flush  10-40 mL Intracatheter Q12H   warfarin  5 mg Oral ONCE-1600   Warfarin - Pharmacist Dosing Inpatient   Does not apply q1600    Madelon Lips, MD 06/14/2022, 11:45 AM

## 2022-06-14 NOTE — Procedures (Signed)
MRN: [id number]  Epilepsy Attending: Lora Havens  Referring Physician/Provider: Greta Doom, MD   Duration: 06/13/2022 1014 to 06/14/2022 1014   Patient history: 66yo F with ams. EEG to evaluate for seizure   Level of alertness: comatose   AEDs during EEG study: Keppra, Vimpat   Technical aspects: This EEG study was done with scalp electrodes positioned according to the 10-20 International system of electrode placement. Electrical activity was acquired at a sampling rate of '500Hz'$  and reviewed with a high frequency filter of '70Hz'$  and a low frequency filter of '1Hz'$ . EEG data were recorded continuously and digitally stored.    Description: EEG showed near continuous generalized 5 to 9 Hz theta-alpha activity admixed with 12 to 14 Hz generalized beta activity.  Continuous 3 to 5 Hz theta-delta slowing was also noted in left posterior quadrant. Polyspikes were noted in left posterior quadrant which at times appeared quasiperiodic  at 1 Hz.   ABNORMALITY -Polyspikes, left posterior quadrant -Continuous slow, generalized and maximal left posterior quadrant   IMPRESSION: This study showed evidence of epileptogenicity and cortical dysfunction in left posterior quadrant likely due to underlying structural abnormality. Additionally there was moderate to severe diffuse encephalopathy, nonspecific etiology. No definite seizures were seen during this study.   Valerie Cline

## 2022-06-14 NOTE — Progress Notes (Signed)
ANTICOAGULATION CONSULT NOTE  Pharmacy Consult for heparin Indication: pulmonary embolus  Allergies  Allergen Reactions   Canagliflozin Other (See Comments)    recurrent vaginitis   Glucophage [Metformin] Other (See Comments)    GI intolerance   Niaspan [Niacin] Other (See Comments)    flushing    Patient Measurements: Height: 5' 5.98" (167.6 cm) Weight: 64.8 kg (142 lb 13.7 oz) IBW/kg (Calculated) : 59.25 Heparin Dosing Weight: 79kg  Vital Signs: Temp: 98.2 F (36.8 C) (08/15 0500) BP: 131/58 (08/15 0800) Pulse Rate: 100 (08/15 0800)  Labs: Recent Labs    06/12/22 0447 06/12/22 1459 06/13/22 0448 06/13/22 0451 06/13/22 1655 06/14/22 0441  HGB  --   --  8.9*  --   --   --   HCT  --   --  29.0*  --   --   --   PLT  --   --  183  --   --   --   LABPROT 17.7*  --  21.4*  --   --  23.9*  INR 1.5*  --  1.9*  --   --  2.2*  HEPARINUNFRC 0.32  --   --  0.29*  --  <0.10*  CREATININE 0.63   < > 0.57  --  0.52 0.54   < > = values in this interval not displayed.     Estimated Creatinine Clearance: 63.9 mL/min (by C-G formula based on SCr of 0.54 mg/dL).   Assessment: 53 yoF admitted with meningitis from OSH. Pt with ongoing fevers and worsening clinical status 7/29, RV severely down on beside ECHO so concerns for acute PE. Pt received alteplase on 7/30 for suspected saddle PE.    Heparin stopped 8/14 with therapeutic INR. INR today is subtherapeutic at 2.2 after starting wafarin on 8/10. Hgb has improved to 8.9, plts stable at 183. No s/sx of bleeding or infusion issues. Will follow goals of care discussion today.  Goal of Therapy:  Heparin level 0.3-0.5 units/mL Monitor platelets by anticoagulation protocol: Yes   Plan:  Coumadin '5mg'$  PO again tonight Daily heparin level, INR, and CBC  Monitor for s/sx bleeding   Thank you for allowing pharmacy to participate in this patient's care.  Reatha Harps, PharmD PGY2 Pharmacy Resident 06/14/2022 8:35 AM Check [email or web address]  for unit specific pharmacy number

## 2022-06-15 DIAGNOSIS — G40901 Epilepsy, unspecified, not intractable, with status epilepticus: Secondary | ICD-10-CM | POA: Diagnosis not present

## 2022-06-15 DIAGNOSIS — R569 Unspecified convulsions: Secondary | ICD-10-CM

## 2022-06-15 DIAGNOSIS — G9341 Metabolic encephalopathy: Secondary | ICD-10-CM | POA: Diagnosis not present

## 2022-06-15 DIAGNOSIS — R4182 Altered mental status, unspecified: Secondary | ICD-10-CM | POA: Diagnosis not present

## 2022-06-15 DIAGNOSIS — J9601 Acute respiratory failure with hypoxia: Secondary | ICD-10-CM | POA: Diagnosis not present

## 2022-06-15 DIAGNOSIS — G039 Meningitis, unspecified: Secondary | ICD-10-CM | POA: Diagnosis not present

## 2022-06-15 LAB — CBC
HCT: 30.9 % — ABNORMAL LOW (ref 36.0–46.0)
HCT: 28.5 % — ABNORMAL LOW (ref 36.0–46.0)
Hemoglobin: 9.3 g/dL — ABNORMAL LOW (ref 12.0–15.0)
Hemoglobin: 8.6 g/dL — ABNORMAL LOW (ref 12.0–15.0)
MCH: 26.9 pg (ref 26.0–34.0)
MCH: 26.7 pg (ref 26.0–34.0)
MCHC: 30.1 g/dL (ref 30.0–36.0)
MCHC: 30.2 g/dL (ref 30.0–36.0)
MCV: 89.3 fL (ref 80.0–100.0)
MCV: 88.5 fL (ref 80.0–100.0)
Platelets: 111 10*3/uL — ABNORMAL LOW (ref 150–400)
Platelets: 104 10*3/uL — ABNORMAL LOW (ref 150–400)
RBC: 3.46 MIL/uL — ABNORMAL LOW (ref 3.87–5.11)
RBC: 3.22 MIL/uL — ABNORMAL LOW (ref 3.87–5.11)
RDW: 23.6 % — ABNORMAL HIGH (ref 11.5–15.5)
RDW: 23.5 % — ABNORMAL HIGH (ref 11.5–15.5)
WBC: 17.1 10*3/uL — ABNORMAL HIGH (ref 4.0–10.5)
WBC: 15.9 10*3/uL — ABNORMAL HIGH (ref 4.0–10.5)
nRBC: 0 % (ref 0.0–0.2)
nRBC: 0 % (ref 0.0–0.2)

## 2022-06-15 LAB — RENAL FUNCTION PANEL
Albumin: 2.5 g/dL — ABNORMAL LOW (ref 3.5–5.0)
Albumin: 2.4 g/dL — ABNORMAL LOW (ref 3.5–5.0)
Anion gap: 7 (ref 5–15)
Anion gap: 11 (ref 5–15)
BUN: 60 mg/dL — ABNORMAL HIGH (ref 8–23)
BUN: 73 mg/dL — ABNORMAL HIGH (ref 8–23)
CO2: 25 mmol/L (ref 22–32)
CO2: 24 mmol/L (ref 22–32)
Calcium: 8.7 mg/dL — ABNORMAL LOW (ref 8.9–10.3)
Calcium: 8.8 mg/dL — ABNORMAL LOW (ref 8.9–10.3)
Chloride: 102 mmol/L (ref 98–111)
Chloride: 102 mmol/L (ref 98–111)
Creatinine, Ser: 1.1 mg/dL — ABNORMAL HIGH (ref 0.44–1.00)
Creatinine, Ser: 1.34 mg/dL — ABNORMAL HIGH (ref 0.44–1.00)
GFR, Estimated: 55 mL/min — ABNORMAL LOW (ref >60)
GFR, Estimated: 43 mL/min — ABNORMAL LOW (ref >60)
Glucose, Bld: 358 mg/dL — ABNORMAL HIGH (ref 70–99)
Glucose, Bld: 300 mg/dL — ABNORMAL HIGH (ref 70–99)
Phosphorus: 3.9 mg/dL (ref 2.5–4.6)
Phosphorus: 3.5 mg/dL (ref 2.5–4.6)
Potassium: 6 mmol/L — ABNORMAL HIGH (ref 3.5–5.1)
Potassium: 5.6 mmol/L — ABNORMAL HIGH (ref 3.5–5.1)
Sodium: 134 mmol/L — ABNORMAL LOW (ref 135–145)
Sodium: 137 mmol/L (ref 135–145)

## 2022-06-15 LAB — HEPATITIS B CORE ANTIBODY, TOTAL: Hep B Core Total Ab: NONREACTIVE

## 2022-06-15 LAB — GLUCOSE, CAPILLARY
Glucose-Capillary: 349 mg/dL — ABNORMAL HIGH (ref 70–99)
Glucose-Capillary: 318 mg/dL — ABNORMAL HIGH (ref 70–99)
Glucose-Capillary: 218 mg/dL — ABNORMAL HIGH (ref 70–99)
Glucose-Capillary: 185 mg/dL — ABNORMAL HIGH (ref 70–99)
Glucose-Capillary: 157 mg/dL — ABNORMAL HIGH (ref 70–99)

## 2022-06-15 LAB — HEPATITIS B SURFACE ANTIGEN: Hepatitis B Surface Ag: NONREACTIVE

## 2022-06-15 LAB — PROTIME-INR
INR: 2.7 — ABNORMAL HIGH (ref 0.8–1.2)
Prothrombin Time: 28.7 seconds — ABNORMAL HIGH (ref 11.4–15.2)

## 2022-06-15 LAB — MAGNESIUM: Magnesium: 2.6 mg/dL — ABNORMAL HIGH (ref 1.7–2.4)

## 2022-06-15 LAB — HEPATITIS B SURFACE ANTIBODY,QUALITATIVE: Hep B S Ab: REACTIVE — AB

## 2022-06-15 LAB — HEPATITIS C ANTIBODY: HCV Ab: NONREACTIVE

## 2022-06-15 MED ORDER — ALBUMIN HUMAN 25 % IV SOLN
25.0000 g | INTRAVENOUS | Status: DC | PRN
  Administered 2022-06-15: 25 g via INTRAVENOUS
  Filled 2022-06-15 (×3): qty 100

## 2022-06-15 MED ORDER — SODIUM ZIRCONIUM CYCLOSILICATE 10 G PO PACK
10.0000 g | PACK | Freq: Two times a day (BID) | ORAL | Status: DC
  Administered 2022-06-15 – 2022-06-16 (×3): 10 g
  Filled 2022-06-15 (×3): qty 1

## 2022-06-15 MED ORDER — LEVETIRACETAM IN NACL 500 MG/100ML IV SOLN: 500.0000 mg | INTRAVENOUS | Status: DC

## 2022-06-15 MED ORDER — LIDOCAINE-PRILOCAINE 2.5-2.5 % EX CREA
1.0000 | TOPICAL_CREAM | CUTANEOUS | Status: DC | PRN
Start: 2022-06-15 — End: 2022-06-25

## 2022-06-15 MED ORDER — HEPARIN SODIUM (PORCINE) 1000 UNIT/ML DIALYSIS
1000.0000 [IU] | INTRAMUSCULAR | Status: DC | PRN
Start: 2022-06-15 — End: 2022-06-25
  Administered 2022-06-15: 1000 [IU]
  Filled 2022-06-15 (×2): qty 1

## 2022-06-15 MED ORDER — LEVETIRACETAM IN NACL 1000 MG/100ML IV SOLN
1000.0000 mg | INTRAVENOUS | Status: DC
  Administered 2022-06-16 – 2022-06-21 (×6): 1000 mg via INTRAVENOUS
  Filled 2022-06-15 (×6): qty 100

## 2022-06-15 MED ORDER — MIDODRINE HCL 5 MG PO TABS
10.0000 mg | ORAL_TABLET | Freq: Once | ORAL | Status: AC
  Administered 2022-06-15: 10 mg via ORAL
  Filled 2022-06-15: qty 2

## 2022-06-15 MED ORDER — ANTICOAGULANT SODIUM CITRATE 4% (200MG/5ML) IV SOLN: 5.0000 mL | Status: DC | PRN

## 2022-06-15 MED ORDER — WARFARIN SODIUM 1 MG PO TABS
1.0000 mg | ORAL_TABLET | Freq: Once | ORAL | Status: AC
Start: 2022-06-15 — End: 2022-06-15
  Administered 2022-06-15: 1 mg via ORAL
  Filled 2022-06-15: qty 1

## 2022-06-15 MED ORDER — CHLORHEXIDINE GLUCONATE CLOTH 2 % EX PADS
6.0000 | MEDICATED_PAD | Freq: Every day | CUTANEOUS | Status: DC
Start: 2022-06-15 — End: 2022-06-15

## 2022-06-15 MED ORDER — INSULIN ASPART 100 UNIT/ML IJ SOLN
8.0000 [IU] | INTRAMUSCULAR | Status: DC
  Administered 2022-06-15 – 2022-06-20 (×31): 8 [IU] via SUBCUTANEOUS

## 2022-06-15 MED ORDER — ALTEPLASE 2 MG IJ SOLR: 2.0000 mg | Freq: Once | INTRAMUSCULAR | Status: DC | PRN

## 2022-06-15 MED ORDER — LIDOCAINE HCL (PF) 1 % IJ SOLN
5.0000 mL | INTRAMUSCULAR | Status: DC | PRN
Start: 2022-06-15 — End: 2022-06-25

## 2022-06-15 MED ORDER — SODIUM CHLORIDE 0.9 % IV SOLN
100.0000 mg | INTRAVENOUS | Status: DC
  Filled 2022-06-15: qty 10

## 2022-06-15 MED ORDER — PENTAFLUOROPROP-TETRAFLUOROETH EX AERO
1.0000 | INHALATION_SPRAY | CUTANEOUS | Status: DC | PRN
Start: 2022-06-15 — End: 2022-06-25

## 2022-06-15 NOTE — Plan of Care (Signed)
  Problem: Respiratory: Goal: Ability to maintain a clear airway and adequate ventilation will improve Outcome: Progressing   Problem: Clinical Measurements: Goal: Cardiovascular complication will be avoided Outcome: Progressing   Problem: Activity: Goal: Ability to tolerate increased activity will improve Outcome: Progressing

## 2022-06-15 NOTE — Progress Notes (Signed)
Subjective: Patient less responsive today.  Did have elevated BUN.  Plan for hemodialysis today  ROS: Unable to obtain due to poor mental status  Examination  Vital signs in last 24 hours: Temp:  [97.6 F (36.4 C)-99.8 F (37.7 C)] 99.5 F (37.5 C) (08/16 1430) Pulse Rate:  [92-113] 96 (08/16 1456) Resp:  [23-35] 28 (08/16 1456) BP: (93-147)/(43-62) 119/43 (08/16 1303) SpO2:  [93 %-98 %] 96 % (08/16 1456) FiO2 (%):  [35 %-40 %] 35 % (08/16 1456) Weight:  [64.5 kg] 64.5 kg (08/16 0400)  General: lying in bed, NAD Neuro: Barely winces to noxious stimuli but does not open her eyes, pupils 23m equal and reactive to light, corneal reflex intact, no gaze deviation, winces to noxious stimuli in Left>right extremities  Basic Metabolic Panel: Recent Labs  Lab 06/11/22 0500 06/11/22 1636 06/12/22 0447 06/12/22 1459 06/13/22 0448 06/13/22 1655 06/14/22 0441 06/14/22 1555 06/15/22 0431 06/15/22 0946  NA 138   < > 138   < > 137 138 138 138 134* 137  K 4.0   < > 4.0   < > 4.0 4.5 4.4 4.8 6.0* 5.6*  CL 106   < > 102   < > 101 105 104 103 102 102  CO2 27   < > 26   < > '26 27 28 28 25 24  '$ GLUCOSE 109*   < > 186*   < > 167* 201* 168* 179* 358* 300*  BUN 28*   < > 27*   < > 22 25* 25* 26* 60* 73*  CREATININE 0.66   < > 0.63   < > 0.57 0.52 0.54 0.44 1.10* 1.34*  CALCIUM 8.2*   < > 8.4*   < > 8.4* 8.4* 8.5* 8.6* 8.7* 8.8*  MG 2.4  --  2.3  --  2.4  --  2.4  --  2.6*  --   PHOS 1.9*   < > 2.3*   < > 2.4* 1.6* 1.4* 1.9* 3.9 3.5   < > = values in this interval not displayed.    CBC: Recent Labs  Lab 06/10/22 0310 06/11/22 0500 06/13/22 0448 06/15/22 0431 06/15/22 0946  WBC 11.2* 13.8* 18.4* 17.1* 15.9*  HGB 7.9* 7.9* 8.9* 9.3* 8.6*  HCT 25.6* 26.5* 29.0* 30.9* 28.5*  MCV 85.6 87.2 86.8 89.3 88.5  PLT 154 154 183 111* 104*     Coagulation Studies: Recent Labs    06/13/22 0448 06/14/22 0441 06/15/22 0431  LABPROT 21.4* 23.9* 28.7*  INR 1.9* 2.2* 2.7*    Imaging No new  brain imaging.     ASSESSMENT AND PLAN: 66year old female with suspected viral meningo-encephalitis (had hand-foot-and-mouth disease prior to presentation) now with status epilepticus.   Focal convulsive status epilepticus, resolved Provoked seizures Acute encephalopathy, infectious Entero-Viral meningo-encephalitis Left meningioma Acute ischemic stroke, incidental Obstructive hydrocephalus Immunocompromised  History of small lymphocytic like B-cell non-Hodgkin lymphoma -Patient had exposure to hand-foot-and-mouth disease and subsequently enterovirus encephalitis.   -Status epilepticus likely due to viral encephalitis, meningioma (meningiomas do not commonly cause status epilepticus but the location of this meningioma is concerning).   -Ischemic strokes and likely due to encephalitis   Recommendations -Switch Keppra to 1000 mg daily as well as an additional dose of 500 mg after dialysis (on M-W-F) -Continue Vimpat 200 mg twice daily and an additional dose of 100 mg after dialysis ( M-W-F) -Discontinue LTM EEG as patient has been seizure-free  -Continue seizure precautions -Appreciate pharmacy assistance with patient's antiepileptic  drug -Continue management of rest of comorbidities per primary team   I have spent a total of 36 minutes with the patient reviewing hospital notes,  test results, labs and examining the patient as well as establishing an assessment and plan.  > 50% of time was spent in direct patient care.   Zeb Comfort Epilepsy [facility] For questions after 5pm please refer to AMION to reach the Neurologist on call

## 2022-06-15 NOTE — Inpatient Diabetes Management (Signed)
Inpatient Diabetes Program Recommendations  AACE/ADA: New Consensus Statement on Inpatient Glycemic Control  Target Ranges:  Prepandial:   less than 140 mg/dL      Peak postprandial:   less than 180 mg/dL (1-2 hours)      Critically ill patients:  140 - 180 mg/dL    Latest Reference Range & Units 06/14/22 07:27 06/14/22 11:10 06/14/22 15:58 06/14/22 19:45 06/14/22 19:59 06/14/22 23:24 06/15/22 03:44 06/15/22 07:57  Glucose-Capillary 70 - 99 mg/dL 111 (H) 103 (H) 152 (H) 258 (H) 267 (H) 324 (H) 349 (H) 318 (H)   Review of Glycemic Control  Diabetes history: DM2 Outpatient Diabetes medications: Humulin R U500 80 units before breakfast, Humulin R U500 50 units before lunch, Humulin R U500 90 units before supper, Dulaglutide 3 mg Qweek Current orders for Inpatient glycemic control: Levemir 25 units BID, Novolog 8 units Q4H, Novolog 0-20 units Q4H; Vital @ 65 ml/hr  Inpatient Diabetes Program Recommendations:    Insulin: CBGs have ranged from 318-349 mg/dl over the past 8 hours. Recommend discontinuing all SQ insulin orders and using ICU Glycemic Control order set to order Phase 2 IV insulin.  Thanks, Barnie Alderman, RN, MSN, [place] Diabetes Coordinator Inpatient Diabetes Program [phone] (Team Pager from 8am to [place])

## 2022-06-15 NOTE — Progress Notes (Signed)
Got to pt's bedside about 1255, got pre VS at 1303, still have not been able to start HD tx d/t issues w/ faucet adampter/leaking water/low water pressure alarms while trying to get the machine set up. LDT is still working on the issue at this time.  Facilities is in the room now to work on the issue

## 2022-06-15 NOTE — Progress Notes (Signed)
[facility] Progress Note   Assessment/ Plan:   ARF, oliguric- b/l creat 0.8 on admission. Creat rising now in setting of new onset shock. Pt admitted for viral meningoencephalitis complicated by critical cervical myopathy and now resp failure w/ multifocal PNA. Pt has lymphoma receiving active chemoRx (rituximab) w/ last dose in June 2023. AKI due to shock which started in last 24 hrs abruptly. CCM suspected acute PE, s/p TPA and now on IV heparin. K+ up to 6.8 -> CCRT 7/30 w/ all 2K fluids -> 4K now. No signs of renal recovery.  - Neurologic status improved  - not on pressors and BP adequate  - stopped CRRT yesterday with increase in Cr and K overnight  - needs IHD--> orders placed for today  - lokelma in the meantime  Volume - keep even Hypophosphatemia - started on neutra-phos with tube feeds but may need IV K phos if phos continues to drop.  Shock - maybe from multilobar PNA + viral menigoencephalitis, as above, on vasopressor support,  H/o lymphoma - getting active chemoRx w/ last dose rituxan in June 2023 AHRF - s/p trach, on TC now AMS - in setting of viral meningoencephalitis.  Remains unresponsive despite being off of sedation for over 96 hours and progressive changes seen on MRI.  Neurology and ID following.  on cEEG Cervical myelopathy  DM2 - on insulin Disposition in ICU, some evidence of improved neurologic status 8/15  Subjective:    CRRT stopped yesterday PM,  BP OK    Objective:   BP (!) 119/49   Pulse (!) 107   Temp 99.8 F (37.7 C) (Axillary)   Resp (!) 32   Ht 5' 5.98" (1.676 m)   Wt 64.5 kg   SpO2 96%   BMI 22.97 kg/m   Intake/Output Summary (Last 24 hours) at 06/15/2022 1048 Last data filed at 06/15/2022 0800 Gross per 24 hour  Intake 1733.99 ml  Output 600 ml  Net 1133.99 ml   Weight change: -0.3 kg  Physical Exam: [id number] a gave a brief smile to husband, on TC CVS: RRR Resp: clear Abd: soft Ext: no LE edema ACCESS: IJ HD  cath  Imaging: No results found.  Labs: BMET Recent Labs  Lab 06/12/22 0447 06/12/22 1459 06/13/22 0448 06/13/22 1655 06/14/22 0441 06/14/22 1555 06/15/22 0431  NA 138 136 137 138 138 138 134*  K 4.0 5.0 4.0 4.5 4.4 4.8 6.0*  CL 102 102 101 105 104 103 102  CO2 '26 27 26 27 28 28 25  '$ GLUCOSE 186* 200* 167* 201* 168* 179* 358*  BUN 27* 26* 22 25* 25* 26* 60*  CREATININE 0.63 0.54 0.57 0.52 0.54 0.44 1.10*  CALCIUM 8.4* 8.4* 8.4* 8.4* 8.5* 8.6* 8.7*  PHOS 2.3* 2.9 2.4* 1.6* 1.4* 1.9* 3.9   CBC Recent Labs  Lab 06/10/22 0310 06/11/22 0500 06/13/22 0448 06/15/22 0431  WBC 11.2* 13.8* 18.4* 17.1*  HGB 7.9* 7.9* 8.9* 9.3*  HCT 25.6* 26.5* 29.0* 30.9*  MCV 85.6 87.2 86.8 89.3  PLT 154 154 183 111*    Medications:     Chlorhexidine Gluconate Cloth  6 each Topical Daily   docusate  100 mg Per Tube BID   fentaNYL (SUBLIMAZE) injection  200 mcg Intravenous Once   insulin aspart  0-20 Units Subcutaneous Q4H   insulin aspart  8 Units Subcutaneous Q4H   insulin detemir  25 Units Subcutaneous BID   midazolam  5 mg Intravenous Once   midodrine  10  mg Oral Once   multivitamin  1 tablet Per Tube QHS   mouth rinse  15 mL Mouth Rinse Q2H   pantoprazole  40 mg Per Tube Daily   polyethylene glycol  17 g Per Tube Daily   sodium chloride flush  10-40 mL Intracatheter Q12H   sodium zirconium cyclosilicate  10 g Per Tube BID   warfarin  1 mg Oral ONCE-1600   Warfarin - Pharmacist Dosing Inpatient   Does not apply q1600    Madelon Lips, MD 06/15/2022, 10:48 AM

## 2022-06-15 NOTE — Progress Notes (Signed)
[place] for heparin Indication: pulmonary embolus  Allergies  Allergen Reactions   Canagliflozin Other (See Comments)    recurrent vaginitis   Glucophage [Metformin] Other (See Comments)    GI intolerance   Niaspan [Niacin] Other (See Comments)    flushing    Patient Measurements: Height: 5' 5.98" (167.6 cm) Weight: 64.5 kg (142 lb 3.2 oz) IBW/kg (Calculated) : 59.25 Heparin Dosing Weight: 79kg  Vital Signs: Temp: 99.8 F (37.7 C) (08/16 0740) Temp Source: Axillary (08/16 0740) BP: 121/49 (08/16 0800) Pulse Rate: 108 (08/16 0800)  Labs: Recent Labs    06/13/22 0448 06/13/22 0451 06/13/22 1655 06/14/22 0441 06/14/22 1555 06/15/22 0431  HGB 8.9*  --   --   --   --  9.3*  HCT 29.0*  --   --   --   --  30.9*  PLT 183  --   --   --   --  111*  LABPROT 21.4*  --   --  23.9*  --  28.7*  INR 1.9*  --   --  2.2*  --  2.7*  HEPARINUNFRC  --  0.29*  --  <0.10*  --   --   CREATININE 0.57  --    < > 0.54 0.44 1.10*   < > = values in this interval not displayed.     Estimated Creatinine Clearance: 46.5 mL/min (A) (by C-G formula based on SCr of 1.1 mg/dL (H)).   Assessment: 45 yoF admitted with meningitis from OSH. Pt with ongoing fevers and worsening clinical status 7/29, RV severely down on beside ECHO so concerns for acute PE. Pt received alteplase on 7/30 for suspected saddle PE.    Heparin stopped 8/14 with therapeutic INR. INR today is therapeutic at 2.7 with a large jump from 2.2 yesterday. Hgb has improved to 9.3, plts decreased to 111. No s/sx of bleeding or infusion issues. Pt has been transitioned from CRRT to [facility] today.   Goal of Therapy:  Heparin level 0.3-0.5 units/mL Monitor platelets by anticoagulation protocol: Yes   Plan:  Reduce Coumadin to 1 mg tonight given sharp rise in INR Daily heparin level, INR, and CBC  Monitor for s/sx bleeding   Thank you for allowing pharmacy to participate in this patient's  care.  Reatha Harps, PharmD PGY2 Pharmacy Resident 06/15/2022 8:39 AM Check [email or web address] for unit specific pharmacy number

## 2022-06-15 NOTE — Progress Notes (Signed)
LTM EEG discontinued -skin breakdown at FZ  at [facility]. [place] notified Nurse

## 2022-06-15 NOTE — Progress Notes (Signed)
NAME:  Valerie Cline, MRN:  [id number], DOB:  28-Jul-1956, LOS: 78 ADMISSION DATE:  05/13/2022, CONSULTATION DATE:  05/24/22 REFERRING MD:  Shela Leff, MD CHIEF COMPLAINT:  Altered mental status, suspected meningitis   History of Present Illness:  66 year old female with small cell lymphocytic B cell non-Hodgkin's lymphoma on Rituxan since 2020 (last dose in June 2023), HTN, HLD, DM, CAD, carotid stenosis, recent hand-foot-and mouth disease transferred from [place] to [facility].   Discharge summary reviewed: She initially presented to OSH on 05/20/22 for left upper extremity tremors. She reported recent hand foot any mouth disease contracted from her grandchild that left her weak and tired prior to this admission. Neurology work-up including MRI, EEG negative. MRI/MRA neg for acute infarct, 13 mm focus in left frontal lobe representing artifact vs small calcified meningioma, chronic small vessel ischemic changes, cerebral atrophy, high-grade stenosis in bilateral ICA, focal stenosis in left ACA and intracranial right vertebral artery. MRI of C-spine showing C4-5 cord compression from advanced disc degeneration with posterior endplate ridging.  Showing left foraminal impingement at the same level.  Also showing C5-6 and C6-7 ADCF with solid arthrodesis.  Neurologist at [facility] had recommended neurosurgical evaluation.  She developed fevers and transferred to ICU concerning for meningitis and started on Vanc, rocephin, ampicillin and acyclovir. LP was performed on 7/24 with WBC 55 RBC 2. Pending herpes, enterovirus, ehrlichia. CT A/P showed bilateral ground glass and lung opacities concerning for multifocal pneumonia.   PCCM consulted for decreased responsiveness and hypoxemia on NRB. On exam obtunded and only able to say name. O2 sats in the 70s on NRB. Transferred to the ICU for emergent intubation   Pertinent  Medical History  As above  Significant Hospital Events: Including procedures,  antibiotic start and stop dates in addition to other pertinent events   7/24 Admitted to [facility] from [place]. Transferred to PCCM when found in respiratory failure 7/25 intubated, bronched, art line placed, PICC placed.  Neuro and ID consulted. Started decadron  7/26 decadron dc/d 7/27 felt most consistent with viral meningoencephalitis  7/29-7/30 sudden onset multiorgan failure; given tpa with improvement 8/4 trach 8/6 trach bleeding 8/12 off sedation awaiting any neurologic response.   Interim History / Subjective:  Tolerated PSV 8/15 CVVH discontinued 8/15.  Potassium 6.0 this morning No evidence of active seizures on continuous EEG, off all sedation   Objective   Blood pressure (!) 116/55, pulse (!) 111, temperature 98.5 F (36.9 C), temperature source Oral, resp. rate (!) 34, height 5' 5.98" (1.676 m), weight 64.5 kg, SpO2 97 %. CVP:  [0 mmHg-10 mmHg] 10 mmHg  Vent Mode: PRVC FiO2 (%):  [40 %] 40 % Set Rate:  [18 bmp] 18 bmp Vt Set:  [480 mL] 480 mL PEEP:  [5 cmH20] 5 cmH20 Pressure Support:  [10 cmH20] 10 cmH20 Plateau Pressure:  [10 cmH20-25 cmH20] 25 cmH20   Intake/Output Summary (Last 24 hours) at 06/15/2022 0719 Last data filed at 06/15/2022 0600 Gross per 24 hour  Intake 1941.48 ml  Output 983 ml  Net 958.48 ml   Filed Weights   06/13/22 0500 06/14/22 0600 06/15/22 0400  Weight: 72.1 kg 64.8 kg 64.5 kg   Physical Exam: Gen: Ill-appearing woman, ventilated HEENT: Trach in good position, CDI, currently on ATC Heart: Distant, regular, no murmur Lungs: Clear superiorly, decreased to both bases, focal inspiratory squeak on the left Extremities: No edema, foot drop boots Abdomen: PEG tube, binder in place Neuro: Patient did not open eyes  to stimulation, does not follow commands or interact at all.  Significant decrease in responsiveness compared with 8/15   Resolved Hospital Problem list   Metabolic acidosis Hypertriglycerides being off propofol infusion Severe  metabolic acidosis UTI  Assessment & Plan:  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  Preceding hand/foot mouth.  CSF enterovirus + Critical cervical myopathy s/p ACDF C5-C7- makes repeat LP dangerous. She remains severely encephalopathic, not waking up; recovery could take weeks to months if it occurs.  Repeat MRI showed some progression which is concerning. Plan: -steroids completed -continue current care, revisit goals w family. There has been some neurological improvement based on her exam 8/15.  Today she is unresponsive, consider metabolic effects (now off CVVH), fatigue, work of breathing.  -? D/c cEEG today -droplet precautions  Dysphagia- s/p PEG, Plan: -TF  Acute hypoxemic/hypercapnic respiratory failure 2/2 multilobar PNA s/p tracheostomy Leukamoid reaction vs. HCAP- improving Plan: -Tolerated PSV on 8/15, push for ATC as she can tolerate -Has completed empiric course of meropenem  Tracheostomy site bleeding- resolved  Small lymphocytic B cell non-hodgkin lymphoma Hypogammaglobinemia- Patient is on Rituxan as an outpatient; last dose in June Plan: -Follow CBC -Supportive care  Status epilepticus- resolved but requiring multiple AEDs Plan: -Appreciate neurology management -Continue Keppra, Vimpat -Phenobarbital weaned off  DM2 with hypoglycemia Plan: -Levemir plus tube feed coverage -Sliding scale insulin  Sudden onset shock, acute renal failure, acute liver failure, RV dilation presumed massive PE (7/29-7/30)- s/p tpa with good improvement in hemodynamics; now off presors Plan: -Warfarin ordered, pharmacy dosing -Scheduled midodrine  Acute renal failure -Appreciate nephrology management -CVVH discontinued 8/15, will likely need IHD.  Possibly today, note potassium 6.0   Diet/type: Tube feeds DVT prophylaxis: Warfarin GI prophylaxis: PPI Lines: PICC  Foley: recurrent retention due to neurological injury, continue foley  for now Code Status:  full code Last date of multidisciplinary goals of care discussion: 8/8, Family: Niece updated at bedside 8/16  This patient is critically ill with multiple organ system failure; which, requires frequent high complexity decision making, assessment, support, evaluation, and titration of therapies. This was completed through the application of advanced monitoring technologies and extensive interpretation of multiple databases. During this encounter critical care time was devoted to patient care services described in this note for 34 minutes.  Baltazar Apo, MD, PhD 06/15/2022, 7:19 AM [facility] [phone] or if no answer before 7:00PM call [phone] For any issues after 7:00PM please call eLink [phone]

## 2022-06-15 NOTE — Procedures (Signed)
MRN: [id number]  Epilepsy Attending: Lora Havens  Referring Physician/Provider: Greta Doom, MD   Duration: 06/14/2022 1014 to 06/15/2022 1014   Patient history: 66yo F with ams. EEG to evaluate for seizure   Level of alertness: lethargic   AEDs during EEG study: Keppra, Vimpat   Technical aspects: This EEG study was done with scalp electrodes positioned according to the 10-20 International system of electrode placement. Electrical activity was acquired at a sampling rate of '500Hz'$  and reviewed with a high frequency filter of '70Hz'$  and a low frequency filter of '1Hz'$ . EEG data were recorded continuously and digitally stored.    Description: EEG showed near continuous generalized 5 to 9 Hz theta-alpha activity admixed with 12 to 14 Hz generalized beta activity.  Continuous 3 to 5 Hz theta-delta slowing was also noted in left posterior quadrant. Polyspikes were noted in left posterior quadrant which at times appeared quasiperiodic  at 1 Hz.   ABNORMALITY -Polyspikes, left posterior quadrant -Continuous slow, generalized and maximal left posterior quadrant   IMPRESSION: This study showed evidence of epileptogenicity and cortical dysfunction in left posterior quadrant likely due to underlying structural abnormality. Additionally there was moderate to severe diffuse encephalopathy, nonspecific etiology. No definite seizures were seen during this study.   Dealva Lafoy [name]

## 2022-06-15 NOTE — Procedures (Signed)
MRN: [id number]  Epilepsy Attending: Lora Havens  Referring Physician/Provider: Greta Doom, MD   Duration: 06/15/2022 1014 to 06/15/2022 1229   Patient history: 66yo F with ams. EEG to evaluate for seizure   Level of alertness: lethargic   AEDs during EEG study: Keppra, Vimpat   Technical aspects: This EEG study was done with scalp electrodes positioned according to the 10-20 International system of electrode placement. Electrical activity was acquired at a sampling rate of '500Hz'$  and reviewed with a high frequency filter of '70Hz'$  and a low frequency filter of '1Hz'$ . EEG data were recorded continuously and digitally stored.    Description: EEG showed near continuous generalized 5 to 9 Hz theta-alpha activity admixed with 12 to 14 Hz generalized beta activity.  Continuous 3 to 5 Hz theta-delta slowing was also noted in left posterior quadrant. Polyspikes were noted in left posterior quadrant which at times appeared quasiperiodic  at 1 Hz.   ABNORMALITY -Polyspikes, left posterior quadrant -Continuous slow, generalized and maximal left posterior quadrant   IMPRESSION: This study showed evidence of epileptogenicity and cortical dysfunction in left posterior quadrant likely due to underlying structural abnormality. Additionally there was moderate to severe diffuse encephalopathy, nonspecific etiology. No definite seizures were seen during this study.   Ayson Cherubini [name]

## 2022-06-16 ENCOUNTER — Inpatient Hospital Stay (HOSPITAL_COMMUNITY): Payer: Medicare Other

## 2022-06-16 DIAGNOSIS — G039 Meningitis, unspecified: Secondary | ICD-10-CM | POA: Diagnosis not present

## 2022-06-16 DIAGNOSIS — G9341 Metabolic encephalopathy: Secondary | ICD-10-CM | POA: Diagnosis not present

## 2022-06-16 DIAGNOSIS — J9601 Acute respiratory failure with hypoxia: Secondary | ICD-10-CM | POA: Diagnosis not present

## 2022-06-16 LAB — BASIC METABOLIC PANEL
Anion gap: 12 (ref 5–15)
BUN: 59 mg/dL — ABNORMAL HIGH (ref 8–23)
CO2: 26 mmol/L (ref 22–32)
Calcium: 8.1 mg/dL — ABNORMAL LOW (ref 8.9–10.3)
Chloride: 96 mmol/L — ABNORMAL LOW (ref 98–111)
Creatinine, Ser: 1.2 mg/dL — ABNORMAL HIGH (ref 0.44–1.00)
GFR, Estimated: 50 mL/min — ABNORMAL LOW (ref >60)
Glucose, Bld: 126 mg/dL — ABNORMAL HIGH (ref 70–99)
Potassium: 3.1 mmol/L — ABNORMAL LOW (ref 3.5–5.1)
Sodium: 134 mmol/L — ABNORMAL LOW (ref 135–145)

## 2022-06-16 LAB — CBC
HCT: 24.7 % — ABNORMAL LOW (ref 36.0–46.0)
Hemoglobin: 7.6 g/dL — ABNORMAL LOW (ref 12.0–15.0)
MCH: 26.9 pg (ref 26.0–34.0)
MCHC: 30.8 g/dL (ref 30.0–36.0)
MCV: 87.3 fL (ref 80.0–100.0)
Platelets: 99 10*3/uL — ABNORMAL LOW (ref 150–400)
RBC: 2.83 MIL/uL — ABNORMAL LOW (ref 3.87–5.11)
RDW: 23.4 % — ABNORMAL HIGH (ref 11.5–15.5)
WBC: 11 10*3/uL — ABNORMAL HIGH (ref 4.0–10.5)
nRBC: 0 % (ref 0.0–0.2)

## 2022-06-16 LAB — RENAL FUNCTION PANEL
Albumin: 2.6 g/dL — ABNORMAL LOW (ref 3.5–5.0)
Anion gap: 11 (ref 5–15)
BUN: 51 mg/dL — ABNORMAL HIGH (ref 8–23)
CO2: 28 mmol/L (ref 22–32)
Calcium: 8.5 mg/dL — ABNORMAL LOW (ref 8.9–10.3)
Chloride: 96 mmol/L — ABNORMAL LOW (ref 98–111)
Creatinine, Ser: 1.02 mg/dL — ABNORMAL HIGH (ref 0.44–1.00)
GFR, Estimated: >60 mL/min (ref >60)
Glucose, Bld: 81 mg/dL (ref 70–99)
Phosphorus: 3.3 mg/dL (ref 2.5–4.6)
Potassium: 3.3 mmol/L — ABNORMAL LOW (ref 3.5–5.1)
Sodium: 135 mmol/L (ref 135–145)

## 2022-06-16 LAB — GLUCOSE, CAPILLARY
Glucose-Capillary: 131 mg/dL — ABNORMAL HIGH (ref 70–99)
Glucose-Capillary: 132 mg/dL — ABNORMAL HIGH (ref 70–99)
Glucose-Capillary: 147 mg/dL — ABNORMAL HIGH (ref 70–99)
Glucose-Capillary: 179 mg/dL — ABNORMAL HIGH (ref 70–99)
Glucose-Capillary: 207 mg/dL — ABNORMAL HIGH (ref 70–99)
Glucose-Capillary: 88 mg/dL (ref 70–99)
Glucose-Capillary: 96 mg/dL (ref 70–99)

## 2022-06-16 LAB — PROTIME-INR
INR: 2.5 — ABNORMAL HIGH (ref 0.8–1.2)
Prothrombin Time: 26.8 seconds — ABNORMAL HIGH (ref 11.4–15.2)

## 2022-06-16 LAB — MAGNESIUM: Magnesium: 1.8 mg/dL (ref 1.7–2.4)

## 2022-06-16 LAB — HEPATITIS B SURFACE ANTIBODY, QUANTITATIVE: Hep B S AB Quant (Post): 107.8 m[IU]/mL (ref >9.9)

## 2022-06-16 MED ORDER — MIDODRINE HCL 5 MG PO TABS
10.0000 mg | ORAL_TABLET | Freq: Three times a day (TID) | ORAL | Status: DC
Start: 2022-06-16 — End: 2022-06-22
  Administered 2022-06-16 – 2022-06-21 (×17): 10 mg
  Filled 2022-06-16 (×18): qty 2

## 2022-06-16 MED ORDER — WARFARIN SODIUM 2 MG PO TABS
4.0000 mg | ORAL_TABLET | Freq: Once | ORAL | Status: AC
Start: 2022-06-16 — End: 2022-06-16
  Administered 2022-06-16: 4 mg via ORAL
  Filled 2022-06-16: qty 2

## 2022-06-16 MED ORDER — INSULIN DETEMIR 100 UNIT/ML ~~LOC~~ SOLN
22.0000 [IU] | Freq: Two times a day (BID) | SUBCUTANEOUS | Status: DC
  Filled 2022-06-16: qty 0.22

## 2022-06-16 MED ORDER — POTASSIUM CHLORIDE 20 MEQ PO PACK
40.0000 meq | PACK | Freq: Once | ORAL | Status: AC
  Administered 2022-06-16: 40 meq
  Filled 2022-06-16: qty 2

## 2022-06-16 MED ORDER — INSULIN DETEMIR 100 UNIT/ML ~~LOC~~ SOLN
22.0000 [IU] | Freq: Two times a day (BID) | SUBCUTANEOUS | Status: DC
  Administered 2022-06-16 – 2022-06-21 (×12): 22 [IU] via SUBCUTANEOUS
  Filled 2022-06-16 (×14): qty 0.22

## 2022-06-16 NOTE — Progress Notes (Signed)
LTM EEG hooked up and running - no initial skin breakdown - push button tested - neuro notified. [facility] monitoring.  

## 2022-06-16 NOTE — Progress Notes (Signed)
[facility] for heparin Indication: pulmonary embolus  Allergies  Allergen Reactions   Canagliflozin Other (See Comments)    recurrent vaginitis   Glucophage [Metformin] Other (See Comments)    GI intolerance   Niaspan [Niacin] Other (See Comments)    flushing    Patient Measurements: Height: 5' 5.98" (167.6 cm) Weight: 65.6 kg (144 lb 10 oz) IBW/kg (Calculated) : 59.25 Heparin Dosing Weight: 79kg  Vital Signs: Temp: 98.4 F (36.9 C) (08/16 2000) Temp Source: Oral (08/16 2000) BP: 118/52 (08/17 0313) Pulse Rate: 96 (08/17 0313)  Labs: Recent Labs    06/14/22 0441 06/14/22 1555 06/15/22 0431 06/15/22 0946 06/16/22 0507  HGB  --   --  9.3* 8.6*  --   HCT  --   --  30.9* 28.5*  --   PLT  --   --  111* 104*  --   LABPROT 23.9*  --  28.7*  --  26.8*  INR 2.2*  --  2.7*  --  2.5*  HEPARINUNFRC <0.10*  --   --   --   --   CREATININE 0.54 0.44 1.10* 1.34*  --      Estimated Creatinine Clearance: 38.1 mL/min (A) (by C-G formula based on SCr of 1.34 mg/dL (H)).   Assessment: 41 yoF admitted with meningitis from OSH. Pt with ongoing fevers and worsening clinical status 7/29, RV severely down on beside ECHO so concerns for acute PE. Pt received alteplase on 7/30 for suspected saddle PE.    Heparin stopped 8/14 with therapeutic INR. INR today is therapeutic at 2.5. Hgb has decreased to 7.6, plts decreased to 99, which may be dilutional after stopping CRRT. No s/sx of bleeding. Pt has been transitioned from CRRT to [facility] on 8/16.   Goal of Therapy:  Heparin level 0.3-0.5 units/mL Monitor platelets by anticoagulation protocol: Yes   Plan:  Increase Coumadin to 4 mg tonight  Daily heparin level, INR, and CBC  Monitor for s/sx bleeding   Thank you for allowing pharmacy to participate in this patient's care.  Reatha Harps, PharmD PGY2 Pharmacy Resident 06/16/2022 6:00 AM Check [email or web address] for unit specific pharmacy number

## 2022-06-16 NOTE — Progress Notes (Signed)
Patient seen today by trach team for consult.  No education is needed at this time.  All necessary equipment is at beside.   Will continue to follow for progression. Patient is still on full ventilatory support. 

## 2022-06-16 NOTE — Progress Notes (Addendum)
[facility] Progress Note   Assessment/ Plan:   ARF, oliguric- b/l creat 0.8 on admission. Creat rising now in setting of new onset shock. Pt admitted for viral meningoencephalitis complicated by critical cervical myopathy and now resp failure w/ multifocal PNA. Pt has lymphoma receiving active chemoRx (rituximab) w/ last dose in June 2023. AKI due to shock which started in last 24 hrs abruptly. CCM suspected acute PE, s/p TPA and now on IV heparin. K+ up to 6.8 -> CCRT 7/30 w/ all 2K fluids -> 4K now. No signs of renal recovery.  - Neurologic status improved  - not on pressors and BP adequate  - stopped CRRT 06/14/22  - first IHD 06/15/22, plan for tomorrow, will try to pull some fluid tomorrow  Volume - keep even Hypophosphatemia - started on neutra-phos with tube feeds but may need IV K phos if phos continues to drop.  Shock - maybe from multilobar PNA + viral menigoencephalitis, as above, on vasopressor support,  H/o lymphoma - getting active chemoRx w/ last dose rituxan in June 2023 AHRF - s/p trach, on TC now AMS - in setting of viral meningoencephalitis.  Remains unresponsive despite being off of sedation for over 96 hours and progressive changes seen on MRI.  Neurology and ID following.  on cEEG Cervical myelopathy  DM2 - on insulin Disposition in ICU, some evidence of improved neurologic status 8/15  Subjective:    Still no overall delta.     Objective:   BP (!) 123/47 (BP Location: Right Arm)   Pulse (!) 104   Temp 99 F (37.2 C) (Axillary)   Resp (!) 28   Ht 5' 5.98" (1.676 m)   Wt 65.6 kg   SpO2 92%   BMI 23.36 kg/m   Intake/Output Summary (Last 24 hours) at 06/16/2022 2423 Last data filed at 06/16/2022 0800 Gross per 24 hour  Intake 1911.73 ml  Output 381 ml  Net 1530.73 ml   Weight change: 6.5 kg  Physical Exam: Gen: unresponsive today CVS: RRR Resp: clear Abd: soft Ext: no LE edema ACCESS: IJ HD cath  Imaging: No results  found.  Labs: BMET Recent Labs  Lab 06/12/22 1459 06/13/22 0448 06/13/22 1655 06/14/22 0441 06/14/22 1555 06/15/22 0431 06/15/22 0946  NA 136 137 138 138 138 134* 137  K 5.0 4.0 4.5 4.4 4.8 6.0* 5.6*  CL 102 101 105 104 103 102 102  CO2 '27 26 27 28 28 25 24  '$ GLUCOSE 200* 167* 201* 168* 179* 358* 300*  BUN 26* 22 25* 25* 26* 60* 73*  CREATININE 0.54 0.57 0.52 0.54 0.44 1.10* 1.34*  CALCIUM 8.4* 8.4* 8.4* 8.5* 8.6* 8.7* 8.8*  PHOS 2.9 2.4* 1.6* 1.4* 1.9* 3.9 3.5   CBC Recent Labs  Lab 06/13/22 0448 06/15/22 0431 06/15/22 0946 06/16/22 0507  WBC 18.4* 17.1* 15.9* 11.0*  HGB 8.9* 9.3* 8.6* 7.6*  HCT 29.0* 30.9* 28.5* 24.7*  MCV 86.8 89.3 88.5 87.3  PLT 183 111* 104* 99*    Medications:     Chlorhexidine Gluconate Cloth  6 each Topical Daily   docusate  100 mg Per Tube BID   fentaNYL (SUBLIMAZE) injection  200 mcg Intravenous Once   insulin aspart  0-20 Units Subcutaneous Q4H   insulin aspart  8 Units Subcutaneous Q4H   insulin detemir  25 Units Subcutaneous BID   midazolam  5 mg Intravenous Once   multivitamin  1 tablet Per Tube QHS   mouth rinse  15 mL  Mouth Rinse Q2H   pantoprazole  40 mg Per Tube Daily   polyethylene glycol  17 g Per Tube Daily   sodium chloride flush  10-40 mL Intracatheter Q12H   sodium zirconium cyclosilicate  10 g Per Tube BID   warfarin  4 mg Oral ONCE-1600   Warfarin - Pharmacist Dosing Inpatient   Does not apply q1600    Madelon Lips, MD 06/16/2022, 8:52 AM

## 2022-06-16 NOTE — Progress Notes (Signed)
Subjective: Continues to be encephalopathic.   ROS: Unable to obtain due to poor mental status  Examination  Vital signs in last 24 hours: Temp:  [98.4 F (36.9 C)-99.7 F (37.6 C)] 99.7 F (37.6 C) (08/17 1130) Pulse Rate:  [78-111] 107 (08/17 1300) Resp:  [16-30] 27 (08/17 1300) BP: (77-138)/(37-67) 117/53 (08/17 1300) SpO2:  [92 %-100 %] 97 % (08/17 1300) FiO2 (%):  [35 %-40 %] 40 % (08/17 1300) Weight:  [65.6 kg-71 kg] 65.6 kg (08/17 0500)    General: lying in bed, NAD Neuro: Barely winces to noxious stimuli in LUE/LLE but does not open her eyes, pupils 39m equal and reactive to light, corneal reflex intact, no gaze deviation, winces to noxious stimuli in LUE/LLE, none with RUE/RLE Basic Metabolic Panel: Recent Labs  Lab 06/12/22 0447 06/12/22 1459 06/13/22 0448 06/13/22 1655 06/14/22 0441 06/14/22 1555 06/15/22 0431 06/15/22 0946 06/16/22 0827 06/16/22 0828  NA 138   < > 137   < > 138 138 134* 137  --  135  K 4.0   < > 4.0   < > 4.4 4.8 6.0* 5.6*  --  3.3*  CL 102   < > 101   < > 104 103 102 102  --  96*  CO2 26   < > 26   < > '28 28 25 24  '$ --  28  GLUCOSE 186*   < > 167*   < > 168* 179* 358* 300*  --  81  BUN 27*   < > 22   < > 25* 26* 60* 73*  --  51*  CREATININE 0.63   < > 0.57   < > 0.54 0.44 1.10* 1.34*  --  1.02*  CALCIUM 8.4*   < > 8.4*   < > 8.5* 8.6* 8.7* 8.8*  --  8.5*  MG 2.3  --  2.4  --  2.4  --  2.6*  --  1.8  --   PHOS 2.3*   < > 2.4*   < > 1.4* 1.9* 3.9 3.5  --  3.3   < > = values in this interval not displayed.    CBC: Recent Labs  Lab 06/11/22 0500 06/13/22 0448 06/15/22 0431 06/15/22 0946 06/16/22 0507  WBC 13.8* 18.4* 17.1* 15.9* 11.0*  HGB 7.9* 8.9* 9.3* 8.6* 7.6*  HCT 26.5* 29.0* 30.9* 28.5* 24.7*  MCV 87.2 86.8 89.3 88.5 87.3  PLT 154 183 111* 104* 99*     Coagulation Studies: Recent Labs    06/14/22 0441 06/15/22 0431 06/16/22 0507  LABPROT 23.9* 28.7* 26.8*  INR 2.2* 2.7* 2.5*    Imaging No new brain imaging.      ASSESSMENT AND PLAN: 66year old female with suspected viral meningo-encephalitis (had hand-foot-and-mouth disease prior to presentation) now with status epilepticus.   Focal convulsive status epilepticus, resolved Provoked seizures Acute encephalopathy, infectious Entero-Viral meningo-encephalitis Left meningioma Acute ischemic stroke, incidental Obstructive hydrocephalus Immunocompromised  History of small lymphocytic like B-cell non-Hodgkin lymphoma -Patient had exposure to hand-foot-and-mouth disease and subsequently enterovirus encephalitis.   -Status epilepticus likely due to viral encephalitis, meningioma (meningiomas do not commonly cause status epilepticus but the location of this meningioma is concerning).   -Ischemic strokes and likely due to encephalitis   Recommendations - Will obtain MRI brain wo contrast as patient continues to be encephalopathic - Will obtain video EEG to assess for intermittent seizure -Continue Keppra to 1000 mg daily as well as an additional dose of 500 mg after  dialysis (on M-W-F) -Continue Vimpat 200 mg twice daily and an additional dose of 100 mg after dialysis ( M-W-F) -She has had worsening in her exam.  Plan of care discussed goals of care on Monday, 06/20/2022.  I did discuss with daughter that if her exam improves then we can continue current plan.  However if she remains comatose without improvement by the end of this month, it will likely suggest significant neurologic injury -Appreciate pharmacy assistance with patient's antiepileptic drug -Continue management of rest of comorbidities per primary team - Discussed plan with Daughter Sharee Pimple , RN at bedside and Dr Lamonte Sakai via secure chat   I have spent a total of 37 minutes with the patient reviewing hospital notes,  test results, labs and examining the patient as well as establishing an assessment and plan.  > 50% of time was spent in direct patient care.    Zeb Comfort [facility] For questions after 5pm please refer to AMION to reach the Neurologist on call

## 2022-06-16 NOTE — Progress Notes (Signed)
NAME:  Valerie Cline, MRN:  [id number], DOB:  Feb 24, 1956, LOS: 24 ADMISSION DATE:  05/16/2022, CONSULTATION DATE:  05/24/22 REFERRING MD:  Shela Leff, MD CHIEF COMPLAINT:  Altered mental status, suspected meningitis   History of Present Illness:  66 year old female with small cell lymphocytic B cell non-Hodgkin's lymphoma on Rituxan since 2020 (last dose in June 2023), HTN, HLD, DM, CAD, carotid stenosis, recent hand-foot-and mouth disease transferred from [place] to [facility].   Discharge summary reviewed: She initially presented to OSH on 05/20/22 for left upper extremity tremors. She reported recent hand foot any mouth disease contracted from her grandchild that left her weak and tired prior to this admission. Neurology work-up including MRI, EEG negative. MRI/MRA neg for acute infarct, 13 mm focus in left frontal lobe representing artifact vs small calcified meningioma, chronic small vessel ischemic changes, cerebral atrophy, high-grade stenosis in bilateral ICA, focal stenosis in left ACA and intracranial right vertebral artery. MRI of C-spine showing C4-5 cord compression from advanced disc degeneration with posterior endplate ridging.  Showing left foraminal impingement at the same level.  Also showing C5-6 and C6-7 ADCF with solid arthrodesis.  Neurologist at [facility] had recommended neurosurgical evaluation.  She developed fevers and transferred to ICU concerning for meningitis and started on Vanc, rocephin, ampicillin and acyclovir. LP was performed on 7/24 with WBC 55 RBC 2. Pending herpes, enterovirus, ehrlichia. CT A/P showed bilateral ground glass and lung opacities concerning for multifocal pneumonia.   PCCM consulted for decreased responsiveness and hypoxemia on NRB. On exam obtunded and only able to say name. O2 sats in the 70s on NRB. Transferred to the ICU for emergent intubation   Pertinent  Medical History  As above  Significant Hospital Events: Including procedures,  antibiotic start and stop dates in addition to other pertinent events   7/24 Admitted to [facility] from [place]. Transferred to PCCM when found in respiratory failure 7/25 intubated, bronched, art line placed, PICC placed.  Neuro and ID consulted. Started decadron  7/26 decadron dc/d 7/27 felt most consistent with viral meningoencephalitis  7/29-7/30 sudden onset multiorgan failure; given tpa with improvement 8/4 trach 8/6 trach bleeding 8/12 off sedation awaiting any neurologic response.   Interim History / Subjective:  Did ATC on 8/16 Intermittent hemodialysis on 8/16 Continuous EEG discontinued Keppra and Vimpat changed to MWF after HD I/O- 5.1 L total   Objective   Blood pressure (!) 138/49, pulse (!) 105, temperature 98.4 F (36.9 C), temperature source Oral, resp. rate (!) 30, height 5' 5.98" (1.676 m), weight 65.6 kg, SpO2 93 %. CVP:  [3 mmHg-9 mmHg] 8 mmHg  Vent Mode: PRVC FiO2 (%):  [35 %-40 %] 40 % Set Rate:  [18 bmp] 18 bmp Vt Set:  [0.48 mL-480 mL] 0.48 mL PEEP:  [5 cmH20] 5 cmH20 Plateau Pressure:  [18 cmH20-22 cmH20] 18 cmH20   Intake/Output Summary (Last 24 hours) at 06/16/2022 0750 Last data filed at 06/16/2022 0700 Gross per 24 hour  Intake 1806.72 ml  Output 370 ml  Net 1436.72 ml   Filed Weights   06/15/22 1430 06/15/22 1815 06/16/22 0500  Weight: (S) 71 kg 71 kg 65.6 kg   Physical Exam: Gen: Ill-appearing woman, ventilated HEENT: Trach in good position, CDI, currently on ATC Heart: Distant, regular, no murmur Lungs: Clear superiorly, decreased to both bases, focal inspiratory squeak on the left Extremities: No edema, foot drop boots Abdomen: PEG tube, binder in place Neuro: Patient did not open eyes to stimulation, does  not follow commands or interact at all.  Significant decrease in responsiveness compared with 8/15   Resolved Hospital Problem list   Metabolic acidosis Hypertriglycerides being off propofol infusion Severe metabolic  acidosis UTI  Assessment & Plan:  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  Preceding hand/foot mouth.  CSF enterovirus + Critical cervical myopathy s/p ACDF C5-C7- makes repeat LP dangerous. She remains severely encephalopathic, not waking up; recovery could take weeks to months if it occurs.  Repeat MRI showed some progression which is concerning. Plan: -Steroids completed -Continuous EEG discontinued -Continue to follow neurological status off sedation.  Hopefully will rebound after intermittent HD on 8/16 -Droplet precautions  Dysphagia- s/p PEG, Plan: -Continue tube feeding  Acute hypoxemic/hypercapnic respiratory failure 2/2 multilobar PNA s/p tracheostomy Leukamoid reaction vs. HCAP- improving Plan: -Push ATC as she can tolerate, PSV if she fatigues -Has completed empiric course of meropenem  Tracheostomy site bleeding- resolved  Small lymphocytic B cell non-hodgkin lymphoma Hypogammaglobinemia- Patient is on Rituxan as an outpatient; last dose in June Plan: -Follow CBC -Supportive care  Status epilepticus- resolved but requiring multiple AEDs Plan: -Appreciate neurology assistance -Keppra and Vimpat schedule changed to MWF post HD -Phenobarbital has been weaned off  DM2 with hypoglycemia Plan: -Levemir plus tube feed coverage, adjust based on CBG -Sliding scale insulin  Sudden onset shock, acute renal failure, acute liver failure, RV dilation presumed massive PE (7/29-7/30)- s/p tpa with good improvement in hemodynamics; now off presors Plan: -Continue warfarin, pharmacy dosing -Scheduled midodrine to be given prior to HD  Acute renal failure -Appreciate nephrology management -Intermittent HD as per nephrology plans   Diet/type: Tube feeds DVT prophylaxis: Warfarin GI prophylaxis: PPI Lines: PICC  Foley: recurrent retention due to neurological injury, continue foley for now Code Status:  full code Last date of  multidisciplinary goals of care discussion: 8/8, planning for Monday 8/21 Family: Discussed status with the patient's sister this morning 8/17.  Did explain that although there was some improved mental status on 8/15, this has been inconsistent and there has not been continued improvement.  I am still very concerned that prognosis for neurological recovery is poor.  She understands.  Plan is for another family meeting scheduled for Monday 8/21 to contemplate next steps.  This patient is critically ill with multiple organ system failure; which, requires frequent high complexity decision making, assessment, support, evaluation, and titration of therapies. This was completed through the application of advanced monitoring technologies and extensive interpretation of multiple databases. During this encounter critical care time was devoted to patient care services described in this note for  32 minutes.  Baltazar Apo, MD, PhD 06/16/2022, 7:50 AM [facility] [phone] or if no answer before 7:00PM call [phone] For any issues after 7:00PM please call eLink [phone]

## 2022-06-17 ENCOUNTER — Inpatient Hospital Stay (HOSPITAL_COMMUNITY): Payer: Medicare Other

## 2022-06-17 DIAGNOSIS — G039 Meningitis, unspecified: Secondary | ICD-10-CM | POA: Diagnosis not present

## 2022-06-17 DIAGNOSIS — G40901 Epilepsy, unspecified, not intractable, with status epilepticus: Secondary | ICD-10-CM | POA: Diagnosis not present

## 2022-06-17 DIAGNOSIS — R4182 Altered mental status, unspecified: Secondary | ICD-10-CM | POA: Diagnosis not present

## 2022-06-17 LAB — GLUCOSE, CAPILLARY
Glucose-Capillary: 152 mg/dL — ABNORMAL HIGH (ref 70–99)
Glucose-Capillary: 225 mg/dL — ABNORMAL HIGH (ref 70–99)
Glucose-Capillary: 202 mg/dL — ABNORMAL HIGH (ref 70–99)
Glucose-Capillary: 161 mg/dL — ABNORMAL HIGH (ref 70–99)
Glucose-Capillary: 104 mg/dL — ABNORMAL HIGH (ref 70–99)
Glucose-Capillary: 178 mg/dL — ABNORMAL HIGH (ref 70–99)
Glucose-Capillary: 202 mg/dL — ABNORMAL HIGH (ref 70–99)

## 2022-06-17 LAB — BASIC METABOLIC PANEL
Anion gap: 14 (ref 5–15)
BUN: 83 mg/dL — ABNORMAL HIGH (ref 8–23)
CO2: 26 mmol/L (ref 22–32)
Calcium: 8.1 mg/dL — ABNORMAL LOW (ref 8.9–10.3)
Chloride: 96 mmol/L — ABNORMAL LOW (ref 98–111)
Creatinine, Ser: 1.47 mg/dL — ABNORMAL HIGH (ref 0.44–1.00)
GFR, Estimated: 39 mL/min — ABNORMAL LOW (ref >60)
Glucose, Bld: 228 mg/dL — ABNORMAL HIGH (ref 70–99)
Potassium: 4.1 mmol/L (ref 3.5–5.1)
Sodium: 136 mmol/L (ref 135–145)

## 2022-06-17 LAB — HEPATIC FUNCTION PANEL
ALT: 54 U/L — ABNORMAL HIGH (ref 0–44)
AST: 32 U/L (ref 15–41)
Albumin: 2.2 g/dL — ABNORMAL LOW (ref 3.5–5.0)
Alkaline Phosphatase: 111 U/L (ref 38–126)
Bilirubin, Direct: 0.2 mg/dL (ref 0.0–0.2)
Indirect Bilirubin: 0.4 mg/dL (ref 0.3–0.9)
Total Bilirubin: 0.6 mg/dL (ref 0.3–1.2)
Total Protein: 5.1 g/dL — ABNORMAL LOW (ref 6.5–8.1)

## 2022-06-17 LAB — CBC
HCT: 24.6 % — ABNORMAL LOW (ref 36.0–46.0)
Hemoglobin: 7.6 g/dL — ABNORMAL LOW (ref 12.0–15.0)
MCH: 26.9 pg (ref 26.0–34.0)
MCHC: 30.9 g/dL (ref 30.0–36.0)
MCV: 86.9 fL (ref 80.0–100.0)
Platelets: 109 10*3/uL — ABNORMAL LOW (ref 150–400)
RBC: 2.83 MIL/uL — ABNORMAL LOW (ref 3.87–5.11)
RDW: 22.8 % — ABNORMAL HIGH (ref 11.5–15.5)
WBC: 10.2 10*3/uL (ref 4.0–10.5)
nRBC: 0 % (ref 0.0–0.2)

## 2022-06-17 LAB — PROTIME-INR
INR: 2.2 — ABNORMAL HIGH (ref 0.8–1.2)
Prothrombin Time: 24.3 seconds — ABNORMAL HIGH (ref 11.4–15.2)

## 2022-06-17 LAB — AMMONIA: Ammonia: 37 umol/L — ABNORMAL HIGH (ref 9–35)

## 2022-06-17 LAB — PROCALCITONIN: Procalcitonin: 0.78 ng/mL

## 2022-06-17 MED ORDER — WARFARIN SODIUM 5 MG PO TABS
5.0000 mg | ORAL_TABLET | Freq: Once | ORAL | Status: AC
Start: 2022-06-17 — End: 2022-06-17
  Administered 2022-06-17: 5 mg via ORAL
  Filled 2022-06-17: qty 1

## 2022-06-17 MED ORDER — SODIUM CHLORIDE 0.9 % IV SOLN
100.0000 mg | INTRAVENOUS | Status: DC
  Filled 2022-06-17 (×2): qty 10

## 2022-06-17 MED ORDER — LEVETIRACETAM IN NACL 500 MG/100ML IV SOLN
500.0000 mg | INTRAVENOUS | Status: DC
  Administered 2022-06-20: 500 mg via INTRAVENOUS
  Filled 2022-06-17 (×2): qty 100

## 2022-06-17 MED ORDER — MODAFINIL 100 MG PO TABS
100.0000 mg | ORAL_TABLET | Freq: Every day | ORAL | Status: DC
  Administered 2022-06-17 – 2022-06-24 (×8): 100 mg
  Filled 2022-06-17 (×8): qty 1

## 2022-06-17 MED ORDER — BANATROL TF EN LIQD
60.0000 mL | Freq: Two times a day (BID) | ENTERAL | Status: DC
  Administered 2022-06-17 – 2022-06-24 (×16): 60 mL
  Filled 2022-06-17 (×17): qty 60

## 2022-06-17 NOTE — Procedures (Signed)
MRN: [id number]  Epilepsy Attending: Lora Havens  Referring Physician/Provider: Lora Havens, MD Duration: 06/16/2022 1957 to 06/17/2022 1957   Patient history: 66yo F with ams. EEG to evaluate for seizure   Level of alertness: lethargic, asleep   AEDs during EEG study: Keppra, Vimpat   Technical aspects: This EEG study was done with scalp electrodes positioned according to the 10-20 International system of electrode placement. Electrical activity was acquired at a sampling rate of '500Hz'$  and reviewed with a high frequency filter of '70Hz'$  and a low frequency filter of '1Hz'$ . EEG data were recorded continuously and digitally stored.    Description: During awake state, no clear posterior dominant rhythm was seen.  Sleep was characterized by sleep spindles (12 to 14 Hz), maximal frontocentral region.  EEG showed continuous generalized 5 to 9 Hz theta-alpha activity admixed with 12 to 14 Hz generalized beta activity.  Continuous 3 to 5 Hz theta-delta slowing was also noted in left posterior quadrant. Polyspikes were noted in left posterior quadrant which at times appeared quasiperiodic  at 1 Hz.   ABNORMALITY -Polyspikes, left posterior quadrant -Continuous slow, generalized and maximal left posterior quadrant   IMPRESSION: This study showed evidence of epileptogenicity and cortical dysfunction in left posterior quadrant likely due to underlying structural abnormality. Additionally there was moderate to severe diffuse encephalopathy, nonspecific etiology. No definite seizures were seen during this study.  Nekita Pita [name]

## 2022-06-17 NOTE — Progress Notes (Signed)
[facility] Progress Note   Assessment/ Plan:   ARF, oliguric- b/l creat 0.8 on admission. Creat rising now in setting of new onset shock. Pt admitted for viral meningoencephalitis complicated by critical cervical myopathy and now resp failure w/ multifocal PNA. Pt has lymphoma receiving active chemoRx (rituximab) w/ last dose in June 2023. AKI due to shock which started in last 24 hrs abruptly. CCM suspected acute PE, s/p TPA and now on IV heparin. K+ up to 6.8 -> CCRT 7/30 w/ all 2K fluids -> 4K now. No signs of renal recovery.  - Neurologic status transiently improved but now is unresponsive again  - not on pressors and BP adequate  - stopped CRRT 06/14/22  - first IHD 06/15/22, plan for HD today with small UF   Shock - maybe from multilobar PNA + viral menigoencephalitis, as above, on vasopressor support,  H/o lymphoma - getting active chemoRx w/ last dose rituxan in June 2023 AHRF - s/p trach, on TC now AMS - in setting of viral meningoencephalitis.  Remains unresponsive despite being off of sedation for over 96 hours and progressive changes seen on MRI.  Neurology and ID following.  on cEEG Cervical myelopathy  DM2 - on insulin Disposition in ICU, some evidence of improved neurologic status 8/15  Subjective:    Remains unresponsive today, still on cEEG.  Dtr and husband at bedside.     Objective:   BP (!) 130/55 (BP Location: Right Arm)   Pulse (!) 105   Temp 99.5 F (37.5 C) (Oral)   Resp (!) 31   Ht 5' 5.98" (1.676 m)   Wt 70.5 kg   SpO2 90%   BMI 25.10 kg/m   Intake/Output Summary (Last 24 hours) at 06/17/2022 1100 Last data filed at 06/17/2022 1000 Gross per 24 hour  Intake 1135.92 ml  Output 667 ml  Net 468.92 ml   Weight change: -0.5 kg  Physical Exam: Gen: unresponsive today CVS: RRR Resp: clear Abd: soft Ext: no LE edema ACCESS: IJ HD cath  Imaging: Overnight EEG with video  Result Date: 06/17/2022 Lora Havens, MD     06/17/2022   8:33 AM MRN: [id number] Epilepsy Attending: Lora Havens Referring Physician/Provider: Lora Havens, MD Duration: 06/16/2022 1957 to 06/17/2022 0830  Patient history: 66yo F with ams. EEG to evaluate for seizure  Level of alertness: lethargic, asleep  AEDs during EEG study: Keppra, Vimpat  Technical aspects: This EEG study was done with scalp electrodes positioned according to the 10-20 International system of electrode placement. Electrical activity was acquired at a sampling rate of '500Hz'$  and reviewed with a high frequency filter of '70Hz'$  and a low frequency filter of '1Hz'$ . EEG data were recorded continuously and digitally stored.  Description: During awake state, no clear posterior dominant rhythm was seen.  Sleep was characterized by sleep spindles (12 to 14 Hz), maximal frontocentral region.  EEG showed continuous generalized 5 to 9 Hz theta-alpha activity admixed with 12 to 14 Hz generalized beta activity.  Continuous 3 to 5 Hz theta-delta slowing was also noted in left posterior quadrant. Polyspikes were noted in left posterior quadrant which at times appeared quasiperiodic  at 1 Hz.  ABNORMALITY -Polyspikes, left posterior quadrant -Continuous slow, generalized and maximal left posterior quadrant  IMPRESSION: This study showed evidence of epileptogenicity and cortical dysfunction in left posterior quadrant likely due to underlying structural abnormality. Additionally there was moderate to severe diffuse encephalopathy, nonspecific etiology. No definite seizures were seen during this  study. Lora Havens   MR BRAIN WO CONTRAST  Result Date: 06/16/2022 CLINICAL DATA:  Meningitis. EXAM: MRI HEAD WITHOUT CONTRAST TECHNIQUE: Multiplanar, multiecho pulse sequences of the brain and surrounding structures were obtained without intravenous contrast. COMPARISON:  CT head 06/10/2022.  MRI 06/06/2022 FINDINGS: Brain: Interval improvement in hyperintensity in the sulci on FLAIR bilaterally. Mild residual over  the convexity. No fluid collection identified. No edema within the brain. No acute infarct or abscess identified. Mild chronic microvascular ischemic change in the white matter. Negative for hemorrhage or mass. No midline shift. Vascular: Normal arterial flow voids Skull and upper cervical spine: No focal skeletal lesion. Sinuses/Orbits: Mild mucosal edema paranasal sinuses with improvement. Bilateral mastoid effusion with improvement. Negative orbit Other: None IMPRESSION: 1. Interval improvement in hyperintensity in the sulci of both cerebral hemispheres. This finding can be seen with meningitis. No fluid collection or abscess or brain edema identified 2. Improving mucosal edema in the paranasal sinuses and mastoid sinus bilaterally. Electronically Signed   By: Franchot Gallo M.D.   On: 06/16/2022 13:02    Labs: BMET Recent Labs  Lab 06/13/22 0448 06/13/22 1655 06/14/22 0441 06/14/22 1555 06/15/22 0431 06/15/22 0946 06/16/22 0828 06/16/22 1349 06/17/22 0450  NA 137 138 138 138 134* 137 135 134* 136  K 4.0 4.5 4.4 4.8 6.0* 5.6* 3.3* 3.1* 4.1  CL 101 105 104 103 102 102 96* 96* 96*  CO'2 26 27 28 28 25 24 28 26 26 '$  GLUCOSE 167* 201* 168* 179* 358* 300* 81 126* 228*  BUN 22 25* 25* 26* 60* 73* 51* 59* 83*  CREATININE 0.57 0.52 0.54 0.44 1.10* 1.34* 1.02* 1.20* 1.47*  CALCIUM 8.4* 8.4* 8.5* 8.6* 8.7* 8.8* 8.5* 8.1* 8.1*  PHOS 2.4* 1.6* 1.4* 1.9* 3.9 3.5 3.3  --   --    CBC Recent Labs  Lab 06/15/22 0431 06/15/22 0946 06/16/22 0507 06/17/22 0450  WBC 17.1* 15.9* 11.0* 10.2  HGB 9.3* 8.6* 7.6* 7.6*  HCT 30.9* 28.5* 24.7* 24.6*  MCV 89.3 88.5 87.3 86.9  PLT 111* 104* 99* 109*    Medications:     Chlorhexidine Gluconate Cloth  6 each Topical Daily   docusate  100 mg Per Tube BID   insulin aspart  0-20 Units Subcutaneous Q4H   insulin aspart  8 Units Subcutaneous Q4H   insulin detemir  22 Units Subcutaneous BID   midodrine  10 mg Per Tube TID   multivitamin  1 tablet Per Tube  QHS   mouth rinse  15 mL Mouth Rinse Q2H   pantoprazole  40 mg Per Tube Daily   polyethylene glycol  17 g Per Tube Daily   sodium chloride flush  10-40 mL Intracatheter Q12H   warfarin  5 mg Oral ONCE-1600   Warfarin - Pharmacist Dosing Inpatient   Does not apply q1600    Madelon Lips, MD 06/17/2022, 11:00 AM

## 2022-06-17 NOTE — Progress Notes (Signed)
MB performed maintenance on electrodes. All impedances are below 10k ohms. No skin breakdown noted.  

## 2022-06-17 NOTE — Progress Notes (Signed)
[facility] for heparin Indication: pulmonary embolus  Allergies  Allergen Reactions   Canagliflozin Other (See Comments)    recurrent vaginitis   Glucophage [Metformin] Other (See Comments)    GI intolerance   Niaspan [Niacin] Other (See Comments)    flushing    Patient Measurements: Height: 5' 5.98" (167.6 cm) Weight: 70.5 kg (155 lb 6.8 oz) IBW/kg (Calculated) : 59.25 Heparin Dosing Weight: 79kg  Vital Signs: Temp: 99.5 F (37.5 C) (08/18 0349) Temp Source: Oral (08/18 0349) BP: 121/55 (08/18 0600) Pulse Rate: 96 (08/18 0600)  Labs: Recent Labs    06/15/22 0431 06/15/22 0946 06/16/22 0507 06/16/22 0828 06/16/22 1349 06/17/22 0450  HGB 9.3* 8.6* 7.6*  --   --  7.6*  HCT 30.9* 28.5* 24.7*  --   --  24.6*  PLT 111* 104* 99*  --   --  109*  LABPROT 28.7*  --  26.8*  --   --  24.3*  INR 2.7*  --  2.5*  --   --  2.2*  CREATININE 1.10* 1.34*  --  1.02* 1.20* 1.47*     Estimated Creatinine Clearance: 34.8 mL/min (A) (by C-G formula based on SCr of 1.47 mg/dL (H)).   Assessment: 1 yoF admitted with meningitis from OSH. Pt with ongoing fevers and worsening clinical status 7/29, RV severely down on beside ECHO so concerns for acute PE. Pt received alteplase on 7/30 for suspected saddle PE.    Heparin stopped 8/14 with therapeutic INR. INR today is therapeutic at 2.5. Hgb has remained at 7.6, plts 109. No s/sx of bleeding. Pt has been transitioned from [facility]RT to [facility] on 8/16.   Goal of Therapy:  Heparin level 0.3-0.5 units/mL Monitor platelets by anticoagulation protocol: Yes   Plan:  Increase Coumadin to 5 mg tonight  Daily heparin level, INR, and CBC  Monitor for s/sx bleeding   Thank you for allowing pharmacy to participate in this patient's care.  Reatha Harps, PharmD PGY2 Pharmacy Resident 06/17/2022 7:17 AM Check [email or web address] for unit specific pharmacy number

## 2022-06-17 NOTE — Progress Notes (Addendum)
ABG RESULTS:  pH: 7.47 PCO2: 40 PO2: 66 HCO3: 29 sO2: 94%  RESULTS GIVEN TO CCM MD.

## 2022-06-17 NOTE — Progress Notes (Signed)
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    Discharge summary reviewed: She initially presented to OSH on 05/20/22 for left upper extremity tremors. She reported recent hand foot any mouth disease contracted from her grandchild that left her weak and tired prior to this admission. Neurology work-up including MRI, EEG negative. MRI/MRA neg for acute infarct, 13 mm focus in left frontal lobe representing artifact vs small calcified meningioma, chronic small vessel ischemic changes, cerebral atrophy, high-grade stenosis in bilateral ICA, focal stenosis in left ACA and intracranial right vertebral artery. MRI of C-spine showing C4-5 cord compression from advanced disc degeneration with posterior endplate ridging.  Showing left foraminal impingement at the same level.  Also showing C5-6 and C6-7 ADCF with solid arthrodesis.  Neurologist at [facility] had recommended neurosurgical evaluation.   She developed fevers and transferred to ICU concerning for meningitis and started on Vanc, rocephin, ampicillin and acyclovir. LP was performed on 7/24 with WBC 55 RBC 2. Pending herpes, enterovirus, ehrlichia. CT A/P showed bilateral ground glass and lung opacities concerning for multifocal pneumonia.    PCCM consulted for decreased responsiveness and hypoxemia on NRB. On exam obtunded and only able to say name. O2 sats in the 70s on NRB. Transferred to the ICU for emergent intubation  Pertinent  Medical History  As above  Significant Hospital Events: Including procedures,  antibiotic start and stop dates in addition to other pertinent events   7/24 Admitted to [facility] from [facility]. Transferred to PCCM when found in respiratory failure 7/25 intubated, bronched, art line placed, PICC placed.  Neuro and ID consulted. Started decadron  7/26 decadron dc/d 7/27 felt most consistent with viral meningoencephalitis  7/29-7/30 sudden onset multiorgan failure; given tpa with improvement 8/4 trach 8/6 trach bleeding 8/12 off sedation awaiting any neurologic response. 8/15 transitioned off CRRT to iHD 8/16 tolerating trach collar  Interim History / Subjective:   MRI brain negative for acute pathology cEEG without evidence of seizure activity Ammonia mildly elevated at 37 ABG 7.45/40/70/94%   Objective   Blood pressure (!) 113/49, pulse (!) 102, temperature 99.5 F (37.5 C), temperature source Oral, resp. rate (!) 29, height 5' 5.98" (1.676 m), weight 70.5 kg, SpO2 97 %. CVP:  [0 mmHg-5 mmHg] 5 mmHg  FiO2 (%):  [40 %] 40 %   Intake/Output Summary (Last 24 hours) at 06/17/2022 0757 Last data filed at 06/17/2022 0600 Gross per 24 hour  Intake 1353.06 ml  Output 460 ml  Net 893.06 ml   Filed Weights   06/15/22 1815 06/16/22 0500 06/17/22 0600  Weight: 71 kg 65.6 kg 70.5 kg   Examination: General: ill appearing woman, no acute distress HENT: [place]/AT, trach in place with trach collar, moist mucous membranes Lungs: decreased breath sounds at bases, no wheezing Cardiovascular: tachycardic, no murmurs Abdomen: soft, non-distended, BS+ Extremities: warm, no edema Neuro: PERRL, not responsive to verbal or tactile stimuli GU: foley in place  Resolved Hospital Problem list   Metabolic acidosis Hypertriglycerides being off propofol infusion Severe metabolic acidosis UTI Tracheostomy site bleeding  Assessment & Plan:  Acute infectious encephalopathy  in setting of viral meningoencephalitis.  Preceding hand/foot mouth.  CSF enterovirus + Critical cervical myopathy  s/p ACDF C5-C7- makes repeat LP dangerous. She remains severely encephalopathic, not waking up; recovery could take weeks to months if it occurs.   Plan: -Steroids completed -Continuous EEG per neurology -Continue to follow neurological status off sedation -Droplet precautions   Dysphagia- s/p PEG, Plan: -Continue tube feeding   Acute hypoxemic/hypercapnic respiratory failure 2/2 multilobar PNA s/p tracheostomy Leukamoid reaction vs. HCAP- improving Plan: -Push ATC as she can tolerate, PSV if she fatigues -Has completed empiric course of meropenem - chest chest radiograph today - ABG reassuring that she is not retaining CO2  Small lymphocytic B cell non-hodgkin lymphoma Hypogammaglobinemia- Patient is on Rituxan as an outpatient; last dose in June Plan: -Follow CBC -Supportive care - Oncology following  Status epilepticus- resolved but requiring multiple AEDs Plan: -Appreciate neurology assistance -Keppra and Vimpat schedule changed to MWF post HD -Phenobarbital has been weaned off   DM2 with hypoglycemia Plan: -Levemir plus tube feed coverage, adjust based on CBG -Sliding scale insulin   Sudden onset shock, acute renal failure, acute liver failure, RV dilation presumed massive PE (7/29-7/30)- s/p tpa with good improvement in hemodynamics; now off presors Plan: -Continue warfarin, pharmacy dosing -Scheduled midodrine to be given prior to HD   Acute renal failure -Appreciate nephrology management -Intermittent HD as per nephrology plans  Best Practice (right click and "Reselect all SmartList Selections" daily)   Diet/type: tubefeeds DVT prophylaxis: Coumadin GI prophylaxis: PPI Lines: Central line and yes and it is still needed Foley:  Yes, and it is still needed Code Status:  full code Last date of multidisciplinary goals of care discussion [update husband at bedside 8/18]  Labs   CBC: Recent Labs  Lab 06/13/22 0448 06/15/22 0431 06/15/22 0946  06/16/22 0507 06/17/22 0450  WBC 18.4* 17.1* 15.9* 11.0* 10.2  HGB 8.9* 9.3* 8.6* 7.6* 7.6*  HCT 29.0* 30.9* 28.5* 24.7* 24.6*  MCV 86.8 89.3 88.5 87.3 86.9  PLT 183 111* 104* 99* 109*    Basic Metabolic Panel: Recent Labs  Lab 06/12/22 0447 06/12/22 1459 06/13/22 0448 06/13/22 1655 06/14/22 0441 06/14/22 1555 06/15/22 0431 06/15/22 0946 06/16/22 0827 06/16/22 0828 06/16/22 1349 06/17/22 0450  NA 138   < > 137   < > 138 138 134* 137  --  135 134* 136  K 4.0   < > 4.0   < > 4.4 4.8 6.0* 5.6*  --  3.3* 3.1* 4.1  CL 102   < > 101   < > 104 103 102 102  --  96* 96* 96*  CO2 26   < > 26   < > '28 28 25 24  '$ --  '28 26 26  '$ GLUCOSE 186*   < > 167*   < > 168* 179* 358* 300*  --  81 126* 228*  BUN 27*   < > 22   < > 25* 26* 60* 73*  --  51* 59* 83*  CREATININE 0.63   < > 0.57   < > 0.54 0.44 1.10* 1.34*  --  1.02* 1.20* 1.47*  CALCIUM 8.4*   < > 8.4*   < > 8.5* 8.6* 8.7* 8.8*  --  8.5* 8.1* 8.1*  MG 2.3  --  2.4  --  2.4  --  2.6*  --  1.8  --   --   --   PHOS 2.3*   < > 2.4*   < >  1.4* 1.9* 3.9 3.5  --  3.3  --   --    < > = values in this interval not displayed.   GFR: Estimated Creatinine Clearance: 34.8 mL/min (A) (by C-G formula based on SCr of 1.47 mg/dL (H)). Recent Labs  Lab 06/15/22 0431 06/15/22 0946 06/16/22 0507 06/17/22 0450  WBC 17.1* 15.9* 11.0* 10.2    Liver Function Tests: Recent Labs  Lab 06/14/22 0441 06/14/22 1555 06/15/22 0431 06/15/22 0946 06/16/22 0828  ALBUMIN 2.5* 2.6* 2.5* 2.4* 2.6*   No results for input(s): "LIPASE", "AMYLASE" in the last 168 hours. No results for input(s): "AMMONIA" in the last 168 hours.  ABG    Component Value Date/Time   PHART 7.16 (LL) 05/29/2022 0610   PCO2ART 95 (HH) 05/29/2022 0610   PO2ART 86 05/29/2022 0610   HCO3 32.7 (H) 05/29/2022 0857   TCO2 28 05/25/2022 0948   O2SAT 80.8 05/29/2022 0857     Coagulation Profile: Recent Labs  Lab 06/13/22 0448 06/14/22 0441 06/15/22 0431 06/16/22 0507  06/17/22 0450  INR 1.9* 2.2* 2.7* 2.5* 2.2*    Cardiac Enzymes: No results for input(s): "CKTOTAL", "CKMB", "CKMBINDEX", "TROPONINI" in the last 168 hours.  HbA1C: Hgb A1c MFr Bld  Date/Time Value Ref Range Status  05/24/2022 03:03 AM 8.0 (H) 4.8 - 5.6 % Final    Comment:    (NOTE) Pre diabetes:          5.7%-6.4%  Diabetes:              >6.4%  Glycemic control for   <7.0% adults with diabetes   11/16/2021 09:34 AM 7.3 (H) 4.8 - 5.6 % Final    Comment:    (NOTE) Pre diabetes:          5.7%-6.4%  Diabetes:              >6.4%  Glycemic control for   <7.0% adults with diabetes     CBG: Recent Labs  Lab 06/16/22 1338 06/16/22 1501 06/16/22 1920 06/16/22 2343 06/17/22 0345  GLUCAP 132* 147* 179* 152* 225*     Critical care time: 35 minutes    Freda Jackson, MD [facility] Office: [phone]   See Amion for personal pager PCCM on call pager [phone] until 7pm. Please call Elink 7p-7a. [phone]

## 2022-06-17 NOTE — Progress Notes (Signed)
Overall, he still seems like there is no much neurological change.  I know she has a continuous EEG. Seems like she still has some encephalopathy.  She has a trach in.  She is off pressors.  She is getting dialysis intermittently.  I am not sure she is having any additional seizures.  Her labs show a BUN of 83 creatinine 1.47.  Her white cell count was 10.2.  Hemoglobin 7.6.  Platelet count 109,000.  All of her vital signs are looking pretty stable.  Temperature 99.5.  Pulse 96.  Blood pressure 121/55.  Her lungs sound decent.  She has decent breath sounds bilaterally.  She has some occasional rhonchi.  Cardiac exam regular rate and rhythm.  Abdomen is soft.  Bowel sounds are decreased.  She has a feeding tube intact.  Neurologically she has really no awareness.  I just had the that she is still not really getting better neurologically.  From what the nurse tells me, this can be a another family meeting on Monday.  I know that she is trying her best.  She is always done a wonderful job when we were treating her.  She was always motivated.  I certainly would have no issues with respect to the family's decision to pull back on therapy if they felt this was needed.  I will totally respect the family's wishes.  I know there tried to be aggressive and hoping that Ms. Lochner will pull out of this eventually.  I know that Ms. Feggins is getting the absolute best care possible by everybody in the CCU.  I know the staff in the CCU are incredibly compassionate and so professional and really are caring.  Lattie Haw, MD  Romans 5:3-5

## 2022-06-17 NOTE — Progress Notes (Addendum)
Subjective: Valerie Cline. Husband at bedside.  ROS: Unable to obtain due to poor mental status  Examination  Vital signs in last 24 hours: Temp:  [98.6 F (37 C)-99.5 F (37.5 C)] 99.1 F (37.3 C) (08/18 1100) Pulse Rate:  [80-111] 110 (08/18 1116) Resp:  [20-33] 33 (08/18 1116) BP: (77-146)/(38-62) 142/62 (08/18 1116) SpO2:  [89 %-100 %] 95 % (08/18 1116) FiO2 (%):  [40 %] 40 % (08/18 1116) Weight:  [70.5 kg] 70.5 kg (08/18 0600)  General: lying in bed, NAD Neuro:  awake, pupils 32m equal and reactive to light, blinking, let gaze preference but does cross midline to right, doesn't follows commands, barely winces to noxious stimuli in Left>right extremities   Basic Metabolic Panel: Recent Labs  Lab 06/12/22 0447 06/12/22 1459 06/13/22 0448 06/13/22 1655 06/14/22 0441 06/14/22 1555 06/15/22 0431 06/15/22 0946 06/16/22 0827 06/16/22 0828 06/16/22 1349 06/17/22 0450  NA 138   < > 137   < > 138 138 134* 137  --  135 134* 136  K 4.0   < > 4.0   < > 4.4 4.8 6.0* 5.6*  --  3.3* 3.1* 4.1  CL 102   < > 101   < > 104 103 102 102  --  96* 96* 96*  CO2 26   < > 26   < > '28 28 25 24  '$ --  '28 26 26  '$ GLUCOSE 186*   < > 167*   < > 168* 179* 358* 300*  --  81 126* 228*  BUN 27*   < > 22   < > 25* 26* 60* 73*  --  51* 59* 83*  CREATININE 0.63   < > 0.57   < > 0.54 0.44 1.10* 1.34*  --  1.02* 1.20* 1.47*  CALCIUM 8.4*   < > 8.4*   < > 8.5* 8.6* 8.7* 8.8*  --  8.5* 8.1* 8.1*  MG 2.3  --  2.4  --  2.4  --  2.6*  --  1.8  --   --   --   PHOS 2.3*   < > 2.4*   < > 1.4* 1.9* 3.9 3.5  --  3.3  --   --    < > = values in this interval not displayed.    CBC: Recent Labs  Lab 06/13/22 0448 06/15/22 0431 06/15/22 0946 06/16/22 0507 06/17/22 0450  WBC 18.4* 17.1* 15.9* 11.0* 10.2  HGB 8.9* 9.3* 8.6* 7.6* 7.6*  HCT 29.0* 30.9* 28.5* 24.7* 24.6*  MCV 86.8 89.3 88.5 87.3 86.9  PLT 183 111* 104* 99* 109*     Coagulation Studies: Recent Labs    06/15/22 0431 06/16/22 0507 06/17/22 0450   LABPROT 28.7* 26.8* 24.3*  INR 2.7* 2.5* 2.2*    Imaging No new brain imaging.  ASSESSMENT AND PLAN: 66year old female with suspected viral meningo-encephalitis (had hand-foot-and-mouth disease prior to presentation) now with status epilepticus.   Focal convulsive status epilepticus, resolved Provoked seizures Acute encephalopathy, infectious Entero-Viral meningo-encephalitis Left meningioma Acute ischemic stroke, incidental Obstructive hydrocephalus Immunocompromised  History of small lymphocytic like B-cell non-Hodgkin lymphoma -Patient had exposure to hand-foot-and-mouth disease and subsequently enterovirus encephalitis.   -Status epilepticus likely due to viral encephalitis, meningioma (meningiomas do not commonly cause status epilepticus but the location of this meningioma is concerning).   -Ischemic strokes and likely due to encephalitis   Recommendations - Continue video EEG to assess for intermittent seizure, will dc tomorrow if no seizures -Will start modafinil  100 mg daily for new stimulation -Continue Keppra to 1000 mg daily as well as an additional dose of 500 mg after dialysis (on M-W-F) -Continue Vimpat 200 mg twice daily and an additional dose of 100 mg after dialysis ( M-W-F) -Plan for [place] on Monday afternoon -Continue management of rest of comorbidities per primary team - Discussed plan with husband at bedside   I have spent a total of 36 minutes with the patient reviewing hospital notes,  test results, labs and examining the patient as well as establishing an assessment and plan.  > 50% of time was spent in direct patient care.   Zeb Comfort Epilepsy [facility] For questions after 5pm please refer to AMION to reach the Neurologist on call

## 2022-06-17 NOTE — Progress Notes (Signed)
Nutrition Follow-up  DOCUMENTATION CODES:   Not applicable  INTERVENTION:   Tube Feeding via PEG:  Vital AF 1.2 at 65 ml/hr This provides 117 g of protein, 1872 kcals, 1264 mL per hour  Consider changing to bolus feeding soon  Banatrol TF BID via tube - Each packet provides 45kcal, 5g soluble fiber and 2g protein per serving.   Add Renal MVI daily  NUTRITION DIAGNOSIS:   Inadequate oral intake related to acute illness as evidenced by NPO status.  Being addressed via TF  GOAL:   Patient will meet greater than or equal to 90% of their needs  Met via TF   MONITOR:   Vent status, Labs, Weight trends, TF tolerance  REASON FOR ASSESSMENT:   Consult, Ventilator Enteral/tube feeding initiation and management  ASSESSMENT:   66 yo female admitted with acute respiratory failure secondary to pneumonia, acute encephalopathy secondary to suspected bacterial vs viral meningitis in immunocompromised patient. 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 Plan for iHD agin today  Tolerating Vital AF 1.2 at 65 ml/hr via PEG  Rectal tube in place; RN reports not lots of stool but very liquid. Plan to trial Banatrol TF to see if this bulk up stool. Ultimate goal is removal of rectal tube  Labs: reviewed Meds: ss novolog, Rena-Vite, miralax, prednisone, colace  Diet Order:   Diet Order             Diet NPO time specified Except for: Sips with Meds  Diet effective midnight                   EDUCATION NEEDS:   Not appropriate for education at this time  Skin:  Skin Assessment: Skin Integrity Issues: Skin  Integrity Issues:: Stage II Stage II: sacrum-small, stable per RN  Last BM:  8/17  Height:   Ht Readings from Last 1 Encounters:  05/26/22 5' 5.98" (1.676 m)    Weight:   Wt Readings from Last 1 Encounters:  06/17/22 70.5 kg     BMI:  Body mass index is 25.1 kg/m.  Estimated Nutritional Needs:   Kcal:  1800-2000 kcals  Protein:  105-125 g  Fluid:  Limit fluid   Kerman Passey MS, RDN, LDN, CNSC Registered Dietitian 3 Clinical Nutrition RD Pager and On-Call Pager Number Located in [place]

## 2022-06-18 DIAGNOSIS — G40901 Epilepsy, unspecified, not intractable, with status epilepticus: Secondary | ICD-10-CM | POA: Diagnosis not present

## 2022-06-18 DIAGNOSIS — R4182 Altered mental status, unspecified: Secondary | ICD-10-CM | POA: Diagnosis not present

## 2022-06-18 DIAGNOSIS — G039 Meningitis, unspecified: Secondary | ICD-10-CM | POA: Diagnosis not present

## 2022-06-18 LAB — CBC
HCT: 26.1 % — ABNORMAL LOW (ref 36.0–46.0)
Hemoglobin: 8.2 g/dL — ABNORMAL LOW (ref 12.0–15.0)
MCH: 26.6 pg (ref 26.0–34.0)
MCHC: 31.4 g/dL (ref 30.0–36.0)
MCV: 84.7 fL (ref 80.0–100.0)
Platelets: 130 10*3/uL — ABNORMAL LOW (ref 150–400)
RBC: 3.08 MIL/uL — ABNORMAL LOW (ref 3.87–5.11)
RDW: 22.1 % — ABNORMAL HIGH (ref 11.5–15.5)
WBC: 8.1 10*3/uL (ref 4.0–10.5)
nRBC: 0 % (ref 0.0–0.2)

## 2022-06-18 LAB — GLUCOSE, CAPILLARY
Glucose-Capillary: 188 mg/dL — ABNORMAL HIGH (ref 70–99)
Glucose-Capillary: 180 mg/dL — ABNORMAL HIGH (ref 70–99)
Glucose-Capillary: 198 mg/dL — ABNORMAL HIGH (ref 70–99)
Glucose-Capillary: 184 mg/dL — ABNORMAL HIGH (ref 70–99)
Glucose-Capillary: 191 mg/dL — ABNORMAL HIGH (ref 70–99)
Glucose-Capillary: 198 mg/dL — ABNORMAL HIGH (ref 70–99)
Glucose-Capillary: 246 mg/dL — ABNORMAL HIGH (ref 70–99)

## 2022-06-18 LAB — BASIC METABOLIC PANEL
Anion gap: 15 (ref 5–15)
BUN: 109 mg/dL — ABNORMAL HIGH (ref 8–23)
CO2: 27 mmol/L (ref 22–32)
Calcium: 8.6 mg/dL — ABNORMAL LOW (ref 8.9–10.3)
Chloride: 94 mmol/L — ABNORMAL LOW (ref 98–111)
Creatinine, Ser: 1.49 mg/dL — ABNORMAL HIGH (ref 0.44–1.00)
GFR, Estimated: 38 mL/min — ABNORMAL LOW (ref >60)
Glucose, Bld: 192 mg/dL — ABNORMAL HIGH (ref 70–99)
Potassium: 3.6 mmol/L (ref 3.5–5.1)
Sodium: 136 mmol/L (ref 135–145)

## 2022-06-18 LAB — PROTIME-INR
INR: 2.4 — ABNORMAL HIGH (ref 0.8–1.2)
Prothrombin Time: 25.8 seconds — ABNORMAL HIGH (ref 11.4–15.2)

## 2022-06-18 LAB — VARICELLA-ZOSTER BY PCR

## 2022-06-18 MED ORDER — FUROSEMIDE 10 MG/ML IJ SOLN
80.0000 mg | Freq: Every day | INTRAMUSCULAR | Status: DC
  Administered 2022-06-19 – 2022-06-23 (×5): 80 mg via INTRAVENOUS
  Filled 2022-06-18 (×5): qty 8

## 2022-06-18 MED ORDER — WARFARIN SODIUM 3 MG PO TABS
3.0000 mg | ORAL_TABLET | Freq: Every day | ORAL | Status: DC
  Administered 2022-06-18 – 2022-06-19 (×2): 3 mg
  Filled 2022-06-18 (×2): qty 1

## 2022-06-18 NOTE — Progress Notes (Signed)
   06/18/22 0800  Vitals  BP (!) 114/51  MAP (mmHg) 69  BP Location Right Arm  BP Method Automatic  Patient Position (if appropriate) Lying  Pulse Rate 97  Resp (!) 28  During Treatment Monitoring  Blood Flow Rate (mL/min) 200 mL/min  HD Safety Checks Performed Yes  Intra-Hemodialysis Comments Tolerated well;Tx completed  Post Treatment  Dialyzer Clearance Clear  Duration of HD Treatment -hour(s) 3.25 hour(s)  Liters Processed 68.3  Fluid Removed 1000  Tolerated HD Treatment Yes  Post-Hemodialysis Comments TX fin. w/o difficulty

## 2022-06-18 NOTE — Progress Notes (Signed)
LTM EEG discontinued - no skin breakdown at unhook.   

## 2022-06-18 NOTE — Progress Notes (Signed)
[place] for heparin > warfarin  Indication: pulmonary embolus  Allergies  Allergen Reactions   Canagliflozin Other (See Comments)    recurrent vaginitis   Glucophage [Metformin] Other (See Comments)    GI intolerance   Niaspan [Niacin] Other (See Comments)    flushing    Patient Measurements: Height: 5' 5.98" (167.6 cm) Weight: 70.5 kg (155 lb 6.8 oz) IBW/kg (Calculated) : 59.25 Heparin Dosing Weight: 79kg  Vital Signs: Temp: 97.8 F (36.6 C) (08/19 1108) Temp Source: Axillary (08/19 1108) BP: 125/52 (08/19 1507) Pulse Rate: 99 (08/19 1507)  Labs: Recent Labs    06/16/22 0507 06/16/22 0828 06/16/22 1349 06/17/22 0450 06/18/22 0438  HGB 7.6*  --   --  7.6* 8.2*  HCT 24.7*  --   --  24.6* 26.1*  PLT 99*  --   --  109* 130*  LABPROT 26.8*  --   --  24.3* 25.8*  INR 2.5*  --   --  2.2* 2.4*  CREATININE  --    < > 1.20* 1.47* 1.49*   < > = values in this interval not displayed.     Estimated Creatinine Clearance: 34.3 mL/min (A) (by C-G formula based on SCr of 1.49 mg/dL (H)).   Assessment: 74 yoF admitted with meningitis from OSH. Pt with ongoing fevers and worsening clinical status 7/29, RV severely down on beside ECHO so concerns for acute PE. Pt received alteplase on 7/30 for suspected saddle PE.    Heparin stopped 8/14 with therapeutic INR. INR today is therapeutic at 2.4. Hgb has remained stable 7-8 plts stable 100 No s/sx of bleeding. Pt has been transitioned from CRRT to [facility] - last 8/18.   Goal of Therapy:  Heparin level 0.3-0.5 units/mL INR 2-3 Monitor platelets by anticoagulation protocol: Yes   Plan:  Warfarin '3mg'$  daily   Daily heparin level, INR, and CBC  Monitor for s/sx bleeding    Bonnita Nasuti Pharm.D. CPP, BCPS Clinical Pharmacist [phone] 06/18/2022 4:24 PM   Check [email or web address] for unit specific pharmacy number

## 2022-06-18 NOTE — Progress Notes (Signed)
LTM EEG discontinued - no skin breakdown at [facility].  Went back  up to put collodion remover in pt's hair because per RN she is going to wash pt's hair with water and shampoo.

## 2022-06-18 NOTE — Procedures (Signed)
MRN: [id number]  Epilepsy Attending: Lora Havens  Referring Physician/Provider: Lora Havens, MD Duration: 06/17/2022 1957 to 06/18/2022 1715   Patient history: 66yo F with ams. EEG to evaluate for seizure   Level of alertness: lethargic, asleep   AEDs during EEG study: Keppra, Vimpat   Technical aspects: This EEG study was done with scalp electrodes positioned according to the 10-20 International system of electrode placement. Electrical activity was acquired at a sampling rate of '500Hz'$  and reviewed with a high frequency filter of '70Hz'$  and a low frequency filter of '1Hz'$ . EEG data were recorded continuously and digitally stored.    Description: During awake state, no clear posterior dominant rhythm was seen.  Sleep was characterized by sleep spindles (12 to 14 Hz), maximal frontocentral region.  EEG showed continuous generalized 5 to 9 Hz theta-alpha activity admixed with 12 to 14 Hz generalized beta activity.  Continuous 3 to 5 Hz theta-delta slowing was also noted in left posterior quadrant. Polyspikes were noted in left posterior quadrant predominantly when awake/stimulated.  ABNORMALITY -Polyspikes, left posterior quadrant -Continuous slow, generalized and maximal left posterior quadrant   IMPRESSION: This study showed evidence of epileptogenicity and cortical dysfunction in left posterior quadrant likely due to underlying structural abnormality. Additionally there was moderate to severe diffuse encephalopathy, nonspecific etiology. No seizures were seen during this study.   Arfa Lamarca [name]

## 2022-06-18 NOTE — Progress Notes (Signed)
Valerie Cline to remove EEG leads, and family would like to discuss overnight results with team before I remove leads

## 2022-06-18 NOTE — Progress Notes (Signed)
RT attempted to titrated FIO2 to 28%, 5L but the patient desated.  Patient put back on 8L, 35% sats improved to 98%.    The trach was scheduled to be changed today, but RT spoke with Dr. Erin Fulling with C[facility] and it was agreed to wait until tomorrow to possibly change to a cuffless trach after she has been on ATC for at least 48 hours.

## 2022-06-18 NOTE — Progress Notes (Signed)
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    Discharge summary reviewed: She initially presented to OSH on 05/20/22 for left upper extremity tremors. She reported recent hand foot any mouth disease contracted from her grandchild that left her weak and tired prior to this admission. Neurology work-up including MRI, EEG negative. MRI/MRA neg for acute infarct, 13 mm focus in left frontal lobe representing artifact vs small calcified meningioma, chronic small vessel ischemic changes, cerebral atrophy, high-grade stenosis in bilateral ICA, focal stenosis in left ACA and intracranial right vertebral artery. MRI of C-spine showing C4-5 cord compression from advanced disc degeneration with posterior endplate ridging.  Showing left foraminal impingement at the same level.  Also showing C5-6 and C6-7 ADCF with solid arthrodesis.  Neurologist at [facility] had recommended neurosurgical evaluation.   She developed fevers and transferred to ICU concerning for meningitis and started on Vanc, rocephin, ampicillin and acyclovir. LP was performed on 7/24 with WBC 55 RBC 2. Pending herpes, enterovirus, ehrlichia. CT A/P showed bilateral ground glass and lung opacities concerning for multifocal pneumonia.    PCCM consulted for decreased responsiveness and hypoxemia on NRB. On exam obtunded and only able to say name. O2 sats in the 70s on NRB. Transferred to the ICU for emergent intubation  Pertinent  Medical History  As above  Significant Hospital Events: Including procedures,  antibiotic start and stop dates in addition to other pertinent events   7/24 Admitted to [facility] from [place]. Transferred to PCCM when found in respiratory failure 7/25 intubated, bronched, art line placed, PICC placed.  Neuro and ID consulted. Started decadron  7/26 decadron dc/d 7/27 felt most consistent with viral meningoencephalitis  7/29-7/30 sudden onset multiorgan failure; given tpa with improvement 8/4 trach 8/6 trach bleeding 8/12 off sedation awaiting any neurologic response. 8/15 transitioned off CRRT to iHD 8/16 tolerating trach collar  Interim History / Subjective:   She has remained on trach collar for 48 hours cEEG has been discontinued She is opening her eyes today   Objective   Blood pressure (!) 120/54, pulse 99, temperature 97.8 F (36.6 C), temperature source Axillary, resp. rate (!) 25, height 5' 5.98" (1.676 m), weight 70.5 kg, SpO2 98 %. CVP:  [1 mmHg-6 mmHg] 1 mmHg  FiO2 (%):  [35 %-40 %] 35 %   Intake/Output Summary (Last 24 hours) at 06/18/2022 1209 Last data filed at 06/18/2022 1100 Gross per 24 hour  Intake 1640 ml  Output 1898 ml  Net -258 ml   Filed Weights   06/15/22 1815 06/16/22 0500 06/17/22 0600  Weight: 71 kg 65.6 kg 70.5 kg   Examination: General: ill appearing woman, no acute distress HENT: [place]/AT, trach in place with trach collar, moist mucous membranes, coughing up secretions Lungs: decreased breath sounds at bases, no wheezing Cardiovascular: rrr, no murmurs Abdomen: soft, non-distended, BS+ Extremities: warm, no edema Neuro: PERRL, not responsive to verbal or tactile stimuli GU: foley in place  Resolved Hospital Problem list   Metabolic acidosis Hypertriglycerides being off propofol infusion Severe metabolic acidosis UTI Tracheostomy site bleeding  Assessment & Plan:  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  Preceding hand/foot mouth.  CSF enterovirus + Critical cervical myopathy s/p ACDF  C5-C7- makes repeat LP dangerous. She remains severely encephalopathic, not waking up; recovery could take weeks to months if it occurs.   Plan: -Steroids completed -Continuous EEG discontinued per neurology -Droplet precautions   Dysphagia- s/p PEG, Plan: -Continue tube feeding   Acute hypoxemic/hypercapnic respiratory failure 2/2 multilobar PNA s/p tracheostomy Leukamoid reaction vs. HCAP- improving Plan: -Continue trach collar support -Has completed empiric course of meropenem - ABG reassuring that she is not retaining CO2 - monitor for any change in her secretions  Small lymphocytic B cell non-hodgkin lymphoma Hypogammaglobinemia- Patient is on Rituxan as an outpatient; last dose in June Plan: -Follow CBC -Supportive care - Oncology following  Status epilepticus- resolved but requiring multiple AEDs Plan: -Appreciate neurology assistance -Keppra and Vimpat schedule changed to MWF post HD -Phenobarbital has been weaned off   DM2 with hypoglycemia Plan: -Levemir plus tube feed coverage, adjust based on CBG -Sliding scale insulin   Sudden onset shock, acute renal failure, acute liver failure, RV dilation presumed massive PE (7/29-7/30)- s/p tpa with good improvement in hemodynamics; now off presors Plan: -Continue warfarin, pharmacy dosing -Scheduled midodrine to be given prior to HD   Acute renal failure -Appreciate nephrology management -Intermittent HD as per nephrology plans  Best Practice (right click and "Reselect all SmartList Selections" daily)   Diet/type: tubefeeds DVT prophylaxis: Coumadin GI prophylaxis: PPI Lines: Central line and yes and it is still needed Foley:  Yes, and it is still needed Code Status:  full code Last date of multidisciplinary goals of care discussion [update husband at bedside 8/18]  Labs   CBC: Recent Labs  Lab 06/15/22 0431 06/15/22 0946 06/16/22 0507 06/17/22 0450 06/18/22 0438  WBC 17.1* 15.9* 11.0* 10.2 8.1  HGB  9.3* 8.6* 7.6* 7.6* 8.2*  HCT 30.9* 28.5* 24.7* 24.6* 26.1*  MCV 89.3 88.5 87.3 86.9 84.7  PLT 111* 104* 99* 109* 130*    Basic Metabolic Panel: Recent Labs  Lab 06/12/22 0447 06/12/22 1459 06/13/22 0448 06/13/22 1655 06/14/22 0441 06/14/22 1555 06/15/22 0431 06/15/22 0946 06/16/22 0827 06/16/22 0828 06/16/22 1349 06/17/22 0450 06/18/22 0438  NA 138   < > 137   < > 138 138 134* 137  --  135 134* 136 136  K 4.0   < > 4.0   < > 4.4 4.8 6.0* 5.6*  --  3.3* 3.1* 4.1 3.6  CL 102   < > 101   < > 104 103 102 102  --  96* 96* 96* 94*  CO2 26   < > 26   < > '28 28 25 24  '$ --  '28 26 26 27  '$ GLUCOSE 186*   < > 167*   < > 168* 179* 358* 300*  --  81 126* 228* 192*  BUN 27*   < > 22   < > 25* 26* 60* 73*  --  51* 59* 83* 109*  CREATININE 0.63   < > 0.57   < > 0.54 0.44 1.10* 1.34*  --  1.02* 1.20* 1.47* 1.49*  CALCIUM 8.4*   < > 8.4*   < > 8.5* 8.6* 8.7* 8.8*  --  8.5* 8.1* 8.1* 8.6*  MG 2.3  --  2.4  --  2.4  --  2.6*  --  1.8  --   --   --   --   PHOS 2.3*   < >  2.4*   < > 1.4* 1.9* 3.9 3.5  --  3.3  --   --   --    < > = values in this interval not displayed.   GFR: Estimated Creatinine Clearance: 34.3 mL/min (A) (by C-G formula based on SCr of 1.49 mg/dL (H)). Recent Labs  Lab 06/15/22 0946 06/16/22 0507 06/17/22 0450 06/17/22 1245 06/18/22 0438  PROCALCITON  --   --   --  0.78  --   WBC 15.9* 11.0* 10.2  --  8.1    Liver Function Tests: Recent Labs  Lab 06/14/22 1555 06/15/22 0431 06/15/22 0946 06/16/22 0828 06/17/22 0850  AST  --   --   --   --  32  ALT  --   --   --   --  54*  ALKPHOS  --   --   --   --  111  BILITOT  --   --   --   --  0.6  PROT  --   --   --   --  5.1*  ALBUMIN 2.6* 2.5* 2.4* 2.6* 2.2*   No results for input(s): "LIPASE", "AMYLASE" in the last 168 hours. Recent Labs  Lab 06/17/22 0850  AMMONIA 37*    ABG    Component Value Date/Time   PHART 7.16 (LL) 05/29/2022 0610   PCO2ART 95 (HH) 05/29/2022 0610   PO2ART 86 05/29/2022 0610    HCO3 32.7 (H) 05/29/2022 0857   TCO2 28 05/25/2022 0948   O2SAT 80.8 05/29/2022 0857     Coagulation Profile: Recent Labs  Lab 06/14/22 0441 06/15/22 0431 06/16/22 0507 06/17/22 0450 06/18/22 0438  INR 2.2* 2.7* 2.5* 2.2* 2.4*    Cardiac Enzymes: No results for input(s): "CKTOTAL", "CKMB", "CKMBINDEX", "TROPONINI" in the last 168 hours.  HbA1C: Hgb A1c MFr Bld  Date/Time Value Ref Range Status  05/24/2022 03:03 AM 8.0 (H) 4.8 - 5.6 % Final    Comment:    (NOTE) Pre diabetes:          5.7%-6.4%  Diabetes:              >6.4%  Glycemic control for   <7.0% adults with diabetes   11/16/2021 09:34 AM 7.3 (H) 4.8 - 5.6 % Final    Comment:    (NOTE) Pre diabetes:          5.7%-6.4%  Diabetes:              >6.4%  Glycemic control for   <7.0% adults with diabetes     CBG: Recent Labs  Lab 06/17/22 1942 06/17/22 2334 06/18/22 0401 06/18/22 0746 06/18/22 1101  GLUCAP 202* 188* 198* 180* 184*     Critical care time: n/a    Freda Jackson, MD [facility] Office: [phone]   See Amion for personal pager PCCM on call pager [phone] until 7pm. Please call Elink 7p-7a. [phone]

## 2022-06-18 NOTE — Progress Notes (Signed)
[facility] Progress Note   Assessment/ Plan:   ARF, oliguric- b/l creat 0.8 on admission. Creat rising now in setting of new onset shock. Pt admitted for viral meningoencephalitis complicated by critical cervical myopathy and now resp failure w/ multifocal PNA. Pt has lymphoma receiving active chemoRx (rituximab) w/ last dose in June 2023. AKI due to shock which started in last 24 hrs abruptly. CCM suspected acute PE, s/p TPA and now on IV heparin. K+ up to 6.8 -> CCRT 7/30 w/ all 2K fluids -> 4K now. No signs of renal recovery.  - Neurologic status transiently improved but now is unresponsive again  - not on pressors and BP adequate  - stopped CRRT 06/14/22  - first IHD 06/15/22, IHD overnight 8/18  - Lasix challenge tomorrow to assess for UOP and renal recovery   Shock - maybe from multilobar PNA + viral menigoencephalitis, as above, on vasopressor support,  H/o lymphoma - getting active chemoRx w/ last dose rituxan in June 2023 AHRF - s/p trach, on TC now AMS - in setting of viral meningoencephalitis.  Remains unresponsive despite being off of sedation for over 96 hours and progressive changes seen on MRI.  Neurology and ID following.  on cEEG Cervical myelopathy  DM2 - on insulin Disposition in ICU, some evidence of improved neurologic status 8/15  Subjective:    Seen in room.  Just finished dialysis, did well, 1L off.  Having more UOP   Objective:   BP 128/60   Pulse (!) 101   Temp 97.6 F (36.4 C)   Resp (!) 25   Ht 5' 5.98" (1.676 m)   Wt 70.5 kg   SpO2 99%   BMI 25.10 kg/m   Intake/Output Summary (Last 24 hours) at 06/18/2022 1051 Last data filed at 06/18/2022 1026 Gross per 24 hour  Intake 1425 ml  Output 1948 ml  Net -523 ml   Weight change:   Physical Exam: Gen: unresponsive today CVS: RRR Resp: clear Abd: soft Ext: no LE edema ACCESS: IJ HD cath  Imaging: DG CHEST PORT 1 VIEW  Result Date: 06/17/2022 CLINICAL DATA:  Respiratory failure  EXAM: PORTABLE CHEST 1 VIEW COMPARISON:  06/03/2022 FINDINGS: No significant change in low volume AP portable chest radiograph. Unchanged support apparatus including tracheostomy and right neck large bore multi lumen vascular catheter. Diffuse bilateral interstitial pulmonary opacity and atelectasis or consolidation of the right lung base with a probable small right pleural effusion. No new or focal airspace opacity. IMPRESSION: 1. No significant change in low volume AP portable chest radiograph. 2. Diffuse bilateral interstitial pulmonary opacity and atelectasis or consolidation of the right lung base with a probable small right pleural effusion, findings most likely reflecting edema. Electronically Signed   By: Delanna Ahmadi M.D.   On: 06/17/2022 12:47   Overnight EEG with video  Result Date: 06/17/2022 Lora Havens, MD     06/18/2022  9:12 AM MRN: [id number] Epilepsy Attending: Lora Havens Referring Physician/Provider: Lora Havens, MD Duration: 06/16/2022 1957 to 06/17/2022 1957  Patient history: 66yo F with ams. EEG to evaluate for seizure  Level of alertness: lethargic, asleep  AEDs during EEG study: Keppra, Vimpat  Technical aspects: This EEG study was done with scalp electrodes positioned according to the 10-20 International system of electrode placement. Electrical activity was acquired at a sampling rate of '500Hz'$  and reviewed with a high frequency filter of '70Hz'$  and a low frequency filter of '1Hz'$ . EEG data were recorded continuously and  digitally stored.  Description: During awake state, no clear posterior dominant rhythm was seen.  Sleep was characterized by sleep spindles (12 to 14 Hz), maximal frontocentral region.  EEG showed continuous generalized 5 to 9 Hz theta-alpha activity admixed with 12 to 14 Hz generalized beta activity.  Continuous 3 to 5 Hz theta-delta slowing was also noted in left posterior quadrant. Polyspikes were noted in left posterior quadrant which at times appeared  quasiperiodic  at 1 Hz.  ABNORMALITY -Polyspikes, left posterior quadrant -Continuous slow, generalized and maximal left posterior quadrant  IMPRESSION: This study showed evidence of epileptogenicity and cortical dysfunction in left posterior quadrant likely due to underlying structural abnormality. Additionally there was moderate to severe diffuse encephalopathy, nonspecific etiology. No definite seizures were seen during this study. Lora Havens   MR BRAIN WO CONTRAST  Result Date: 06/16/2022 CLINICAL DATA:  Meningitis. EXAM: MRI HEAD WITHOUT CONTRAST TECHNIQUE: Multiplanar, multiecho pulse sequences of the brain and surrounding structures were obtained without intravenous contrast. COMPARISON:  CT head 06/10/2022.  MRI 06/06/2022 FINDINGS: Brain: Interval improvement in hyperintensity in the sulci on FLAIR bilaterally. Mild residual over the convexity. No fluid collection identified. No edema within the brain. No acute infarct or abscess identified. Mild chronic microvascular ischemic change in the white matter. Negative for hemorrhage or mass. No midline shift. Vascular: Normal arterial flow voids Skull and upper cervical spine: No focal skeletal lesion. Sinuses/Orbits: Mild mucosal edema paranasal sinuses with improvement. Bilateral mastoid effusion with improvement. Negative orbit Other: None IMPRESSION: 1. Interval improvement in hyperintensity in the sulci of both cerebral hemispheres. This finding can be seen with meningitis. No fluid collection or abscess or brain edema identified 2. Improving mucosal edema in the paranasal sinuses and mastoid sinus bilaterally. Electronically Signed   By: Franchot Gallo M.D.   On: 06/16/2022 13:02    Labs: BMET Recent Labs  Lab 06/13/22 0448 06/13/22 1655 06/14/22 0441 06/14/22 1555 06/15/22 0431 06/15/22 0946 06/16/22 0828 06/16/22 1349 06/17/22 0450 06/18/22 0438  NA 137 138 138 138 134* 137 135 134* 136 136  K 4.0 4.5 4.4 4.8 6.0* 5.6* 3.3*  3.1* 4.1 3.6  CL 101 105 104 103 102 102 96* 96* 96* 94*  CO'2 26 27 28 28 25 24 28 26 26 '$ 27  GLUCOSE 167* 201* 168* 179* 358* 300* 81 126* 228* 192*  BUN 22 25* 25* 26* 60* 73* 51* 59* 83* 109*  CREATININE 0.57 0.52 0.54 0.44 1.10* 1.34* 1.02* 1.20* 1.47* 1.49*  CALCIUM 8.4* 8.4* 8.5* 8.6* 8.7* 8.8* 8.5* 8.1* 8.1* 8.6*  PHOS 2.4* 1.6* 1.4* 1.9* 3.9 3.5 3.3  --   --   --    CBC Recent Labs  Lab 06/15/22 0946 06/16/22 0507 06/17/22 0450 06/18/22 0438  WBC 15.9* 11.0* 10.2 8.1  HGB 8.6* 7.6* 7.6* 8.2*  HCT 28.5* 24.7* 24.6* 26.1*  MCV 88.5 87.3 86.9 84.7  PLT 104* 99* 109* 130*    Medications:     Chlorhexidine Gluconate Cloth  6 each Topical Daily   docusate  100 mg Per Tube BID   fiber supplement (BANATROL TF)  60 mL Per Tube BID   insulin aspart  0-20 Units Subcutaneous Q4H   insulin aspart  8 Units Subcutaneous Q4H   insulin detemir  22 Units Subcutaneous BID   midodrine  10 mg Per Tube TID   modafinil  100 mg Per Tube Daily   multivitamin  1 tablet Per Tube QHS   mouth rinse  15  mL Mouth Rinse Q2H   pantoprazole  40 mg Per Tube Daily   polyethylene glycol  17 g Per Tube Daily   sodium chloride flush  10-40 mL Intracatheter Q12H   Warfarin - Pharmacist Dosing Inpatient   Does not apply q1600    Madelon Lips, MD 06/18/2022, 10:51 AM

## 2022-06-19 DIAGNOSIS — G039 Meningitis, unspecified: Secondary | ICD-10-CM | POA: Diagnosis not present

## 2022-06-19 LAB — BASIC METABOLIC PANEL
Anion gap: 11 (ref 5–15)
BUN: 66 mg/dL — ABNORMAL HIGH (ref 8–23)
CO2: 30 mmol/L (ref 22–32)
Calcium: 8.4 mg/dL — ABNORMAL LOW (ref 8.9–10.3)
Chloride: 96 mmol/L — ABNORMAL LOW (ref 98–111)
Creatinine, Ser: 0.99 mg/dL (ref 0.44–1.00)
GFR, Estimated: >60 mL/min (ref >60)
Glucose, Bld: 236 mg/dL — ABNORMAL HIGH (ref 70–99)
Potassium: 3.5 mmol/L (ref 3.5–5.1)
Sodium: 137 mmol/L (ref 135–145)

## 2022-06-19 LAB — PROTIME-INR
INR: 2.1 — ABNORMAL HIGH (ref 0.8–1.2)
Prothrombin Time: 23.4 seconds — ABNORMAL HIGH (ref 11.4–15.2)

## 2022-06-19 LAB — CBC
HCT: 26.8 % — ABNORMAL LOW (ref 36.0–46.0)
Hemoglobin: 8.1 g/dL — ABNORMAL LOW (ref 12.0–15.0)
MCH: 26.6 pg (ref 26.0–34.0)
MCHC: 30.2 g/dL (ref 30.0–36.0)
MCV: 87.9 fL (ref 80.0–100.0)
Platelets: 149 10*3/uL — ABNORMAL LOW (ref 150–400)
RBC: 3.05 MIL/uL — ABNORMAL LOW (ref 3.87–5.11)
RDW: 21.4 % — ABNORMAL HIGH (ref 11.5–15.5)
WBC: 6.1 10*3/uL (ref 4.0–10.5)
nRBC: 0 % (ref 0.0–0.2)

## 2022-06-19 LAB — GLUCOSE, CAPILLARY
Glucose-Capillary: 152 mg/dL — ABNORMAL HIGH (ref 70–99)
Glucose-Capillary: 157 mg/dL — ABNORMAL HIGH (ref 70–99)
Glucose-Capillary: 161 mg/dL — ABNORMAL HIGH (ref 70–99)
Glucose-Capillary: 197 mg/dL — ABNORMAL HIGH (ref 70–99)
Glucose-Capillary: 217 mg/dL — ABNORMAL HIGH (ref 70–99)
Glucose-Capillary: 221 mg/dL — ABNORMAL HIGH (ref 70–99)

## 2022-06-19 NOTE — Progress Notes (Signed)
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.    Discharge summary reviewed: She initially presented to OSH on 05/20/22 for left upper extremity tremors. She reported recent hand foot any mouth disease contracted from her grandchild that left her weak and tired prior to this admission. Neurology work-up including MRI, EEG negative. MRI/MRA neg for acute infarct, 13 mm focus in left frontal lobe representing artifact vs small calcified meningioma, chronic small vessel ischemic changes, cerebral atrophy, high-grade stenosis in bilateral ICA, focal stenosis in left ACA and intracranial right vertebral artery. MRI of C-spine showing C4-5 cord compression from advanced disc degeneration with posterior endplate ridging.  Showing left foraminal impingement at the same level.  Also showing C5-6 and C6-7 ADCF with solid arthrodesis.  Neurologist at [facility] had recommended neurosurgical evaluation.   She developed fevers and transferred to ICU concerning for meningitis and started on Vanc, rocephin, ampicillin and acyclovir. LP was performed on 7/24 with WBC 55 RBC 2. Pending herpes, enterovirus, ehrlichia. CT A/P showed bilateral ground glass and lung opacities concerning for multifocal pneumonia.    PCCM consulted for decreased responsiveness and hypoxemia on NRB. On exam obtunded and only able to say name. O2 sats in the 70s on NRB. Transferred to the ICU for emergent intubation  Pertinent  Medical History  As above  Significant Hospital Events: Including procedures,  antibiotic start and stop dates in addition to other pertinent events   7/24 Admitted to [facility] from [place]. Transferred to PCCM when found in respiratory failure 7/25 intubated, bronched, art line placed, PICC placed.  Neuro and ID consulted. Started decadron  7/26 decadron dc/d 7/27 felt most consistent with viral meningoencephalitis  7/29-7/30 sudden onset multiorgan failure; given tpa with improvement 8/4 trach 8/6 trach bleeding 8/12 off sedation awaiting any neurologic response. 8/15 transitioned off CRRT to iHD 8/16 tolerating trach collar 8/20 trach exchange to cuffless trach  Interim History / Subjective:   No acute events overnight Husband at bedside  Objective   Blood pressure (!) 136/57, pulse (!) 102, temperature 98.7 F (37.1 C), temperature source Oral, resp. rate (!) 27, height 5' 5.98" (1.676 m), weight 68.6 kg, SpO2 100 %. CVP:  [1 mmHg-8 mmHg] 8 mmHg  FiO2 (%):  [35 %-40 %] 35 %   Intake/Output Summary (Last 24 hours) at 06/19/2022 7902 Last data filed at 06/19/2022 0747 Gross per 24 hour  Intake 1050 ml  Output 617 ml  Net 433 ml   Filed Weights   06/17/22 0600 06/18/22 0600 06/19/22 0600  Weight: 70.5 kg 68.9 kg 68.6 kg   Examination: General: ill appearing woman, no acute distress HENT: [place]/AT, trach in place with trach collar, moist mucous membranes Lungs: decreased breath sounds at bases, no wheezing Cardiovascular: rrr, no murmurs Abdomen: soft, non-distended, BS+ Extremities: warm, no edema Neuro: PERRL, opens eyes, smiles to command GU: foley in place  Resolved Hospital Problem list   Metabolic acidosis Hypertriglycerides being off propofol infusion Severe metabolic acidosis UTI Tracheostomy site bleeding Leukamoid reaction vs. HCAP  Assessment & Plan:  Acute infectious encephalopathy in setting of viral  meningoencephalitis.  Preceding hand/foot mouth.  CSF enterovirus + Critical cervical myopathy s/p ACDF C5-C7- makes repeat LP  dangerous. She remains severely encephalopathic, not waking up; recovery could take weeks to months if it occurs.   Plan: -Steroids completed -Droplet precautions   Dysphagia- s/p PEG, Plan: -Continue tube feeding   Acute hypoxemic/hypercapnic respiratory failure 2/2 multilobar PNA s/p tracheostomy Plan: -Continue trach collar support -Has completed empiric course of meropenem - monitor for any change in her secretions - Trach exchange today to cuffless trach  Small lymphocytic B cell non-hodgkin lymphoma Hypogammaglobinemia- Patient is on Rituxan as an outpatient; last dose in June Plan: -Follow CBC -Supportive care - Oncology following  Status epilepticus- resolved but requiring multiple AEDs Plan: -Appreciate neurology assistance -Keppra and Vimpat schedule changed to MWF post HD -Phenobarbital has been weaned off   DM2 with hypoglycemia Plan: -Levemir plus tube feed coverage, adjust based on CBG -Sliding scale insulin   Sudden onset shock, acute renal failure, acute liver failure, RV dilation presumed massive PE (7/29-7/30)- s/p tpa with good improvement in hemodynamics; now off presors Plan: -Continue warfarin, pharmacy dosing -Scheduled midodrine to be given prior to HD   Acute renal failure -Appreciate nephrology management -Intermittent HD as per nephrology plans  Best Practice (right click and "Reselect all SmartList Selections" daily)   Diet/type: tubefeeds DVT prophylaxis: Coumadin GI prophylaxis: PPI Lines: Central line and yes and it is still needed Foley:  Yes, and it is still needed Code Status:  full code Last date of multidisciplinary goals of care discussion [update husband at bedside 8/18]  Labs   CBC: Recent Labs  Lab 06/15/22 0946 06/16/22 0507 06/17/22 0450 06/18/22 0438 06/19/22 0447  WBC 15.9* 11.0* 10.2 8.1 6.1  HGB 8.6* 7.6* 7.6* 8.2* 8.1*  HCT 28.5* 24.7* 24.6* 26.1* 26.8*  MCV 88.5 87.3 86.9 84.7 87.9  PLT 104* 99* 109*  130* 149*    Basic Metabolic Panel: Recent Labs  Lab 06/13/22 0448 06/13/22 1655 06/14/22 0441 06/14/22 1555 06/15/22 0431 06/15/22 0946 06/16/22 0827 06/16/22 0828 06/16/22 1349 06/17/22 0450 06/18/22 0438 06/19/22 0447  NA 137   < > 138 138 134* 137  --  135 134* 136 136 137  K 4.0   < > 4.4 4.8 6.0* 5.6*  --  3.3* 3.1* 4.1 3.6 3.5  CL 101   < > 104 103 102 102  --  96* 96* 96* 94* 96*  CO2 26   < > '28 28 25 24  '$ --  '28 26 26 27 30  '$ GLUCOSE 167*   < > 168* 179* 358* 300*  --  81 126* 228* 192* 236*  BUN 22   < > 25* 26* 60* 73*  --  51* 59* 83* 109* 66*  CREATININE 0.57   < > 0.54 0.44 1.10* 1.34*  --  1.02* 1.20* 1.47* 1.49* 0.99  CALCIUM 8.4*   < > 8.5* 8.6* 8.7* 8.8*  --  8.5* 8.1* 8.1* 8.6* 8.4*  MG 2.4  --  2.4  --  2.6*  --  1.8  --   --   --   --   --   PHOS 2.4*   < > 1.4* 1.9* 3.9 3.5  --  3.3  --   --   --   --    < > = values in this interval not displayed.   GFR: Estimated Creatinine Clearance: 51.6 mL/min (by C-G formula based on SCr of 0.99 mg/dL). Recent Labs  Lab 06/16/22 0507 06/17/22 0450 06/17/22 1245 06/18/22 0438 06/19/22 0447  PROCALCITON  --   --  0.78  --   --   WBC 11.0* 10.2  --  8.1 6.1    Liver Function Tests: Recent Labs  Lab 06/14/22 1555 06/15/22 0431 06/15/22 0946 06/16/22 0828 06/17/22 0850  AST  --   --   --   --  32  ALT  --   --   --   --  54*  ALKPHOS  --   --   --   --  111  BILITOT  --   --   --   --  0.6  PROT  --   --   --   --  5.1*  ALBUMIN 2.6* 2.5* 2.4* 2.6* 2.2*   No results for input(s): "LIPASE", "AMYLASE" in the last 168 hours. Recent Labs  Lab 06/17/22 0850  AMMONIA 37*    ABG    Component Value Date/Time   PHART 7.16 (LL) 05/29/2022 0610   PCO2ART 95 (HH) 05/29/2022 0610   PO2ART 86 05/29/2022 0610   HCO3 32.7 (H) 05/29/2022 0857   TCO2 28 05/25/2022 0948   O2SAT 80.8 05/29/2022 0857     Coagulation Profile: Recent Labs  Lab 06/15/22 0431 06/16/22 0507 06/17/22 0450 06/18/22 0438  06/19/22 0447  INR 2.7* 2.5* 2.2* 2.4* 2.1*    Cardiac Enzymes: No results for input(s): "CKTOTAL", "CKMB", "CKMBINDEX", "TROPONINI" in the last 168 hours.  HbA1C: Hgb A1c MFr Bld  Date/Time Value Ref Range Status  05/24/2022 03:03 AM 8.0 (H) 4.8 - 5.6 % Final    Comment:    (NOTE) Pre diabetes:          5.7%-6.4%  Diabetes:              >6.4%  Glycemic control for   <7.0% adults with diabetes   11/16/2021 09:34 AM 7.3 (H) 4.8 - 5.6 % Final    Comment:    (NOTE) Pre diabetes:          5.7%-6.4%  Diabetes:              >6.4%  Glycemic control for   <7.0% adults with diabetes     CBG: Recent Labs  Lab 06/18/22 1612 06/18/22 2035 06/18/22 2319 06/19/22 0412 06/19/22 0739  GLUCAP 191* 198* 246* 217* 221*     Critical care time: n/a    Freda Jackson, MD [facility] Office: [phone]   See Amion for personal pager PCCM on call pager [phone] until 7pm. Please call [place] 7p-7a. [phone]

## 2022-06-19 NOTE — Progress Notes (Signed)
[facility] Progress Note   Assessment/ Plan:   ARF, oliguric- b/l creat 0.8 on admission. Creat rising now in setting of new onset shock. Pt admitted for viral meningoencephalitis complicated by critical cervical myopathy and now resp failure w/ multifocal PNA. Pt has lymphoma receiving active chemoRx (rituximab) w/ last dose in June 2023. AKI due to shock which started in last 24 hrs abruptly. CCM suspected acute PE, s/p TPA and now on IV heparin. K+ up to 6.8 -> CCRT 7/30 w/ all 2K fluids -> 4K now. No signs of renal recovery.  - Neurologic status transiently improved but now is unresponsive again  - not on pressors and BP adequate  - stopped CRRT 06/14/22  - first IHD 06/15/22, IHD overnight 8/18  - Lasix challenge today- looks like some nascent recovery  - IHD orders written for 2nd shift tomorrow in case we don't need to do   Shock - maybe from multilobar PNA + viral menigoencephalitis, as above, on vasopressor support,  H/o lymphoma - getting active chemoRx w/ last dose rituxan in June 2023 AHRF - s/p trach, on TC now AMS - in setting of viral meningoencephalitis.  Remains unresponsive despite being off of sedation for over 96 hours and progressive changes seen on MRI.  Neurology and ID following.  on cEEG Cervical myelopathy  DM2 - on insulin Disposition in ICU, some evidence of improved neurologic status 8/15  Subjective:   UOP improved, for IV Lasix today.  Hopefully some evidence of recovery   Objective:   BP (!) 136/57   Pulse (!) 102   Temp 98.7 F (37.1 C) (Oral)   Resp (!) 27   Ht 5' 5.98" (1.676 m)   Wt 68.6 kg   SpO2 100%   BMI 24.42 kg/m   Intake/Output Summary (Last 24 hours) at 06/19/2022 1010 Last data filed at 06/19/2022 0900 Gross per 24 hour  Intake 1050 ml  Output 742 ml  Net 308 ml   Weight change: -0.3 kg  Physical Exam: Gen: awake, looking at husband and tracking CVS: RRR Resp: clear Abd: soft Ext: no LE edema ACCESS: IJ HD  cath  Imaging: DG CHEST PORT 1 VIEW  Result Date: 06/17/2022 CLINICAL DATA:  Respiratory failure EXAM: PORTABLE CHEST 1 VIEW COMPARISON:  06/03/2022 FINDINGS: No significant change in low volume AP portable chest radiograph. Unchanged support apparatus including tracheostomy and right neck large bore multi lumen vascular catheter. Diffuse bilateral interstitial pulmonary opacity and atelectasis or consolidation of the right lung base with a probable small right pleural effusion. No new or focal airspace opacity. IMPRESSION: 1. No significant change in low volume AP portable chest radiograph. 2. Diffuse bilateral interstitial pulmonary opacity and atelectasis or consolidation of the right lung base with a probable small right pleural effusion, findings most likely reflecting edema. Electronically Signed   By: Delanna Ahmadi M.D.   On: 06/17/2022 12:47    Labs: BMET Recent Labs  Lab 06/13/22 0448 06/13/22 1655 06/14/22 0441 06/14/22 1555 06/15/22 0431 06/15/22 0946 06/16/22 0828 06/16/22 1349 06/17/22 0450 06/18/22 0438 06/19/22 0447  NA 137 138 138 138 134* 137 135 134* 136 136 137  K 4.0 4.5 4.4 4.8 6.0* 5.6* 3.3* 3.1* 4.1 3.6 3.5  CL 101 105 104 103 102 102 96* 96* 96* 94* 96*  CO'2 26 27 28 28 25 24 28 26 26 '$ 27 30  GLUCOSE 167* 201* 168* 179* 358* 300* 81 126* 228* 192* 236*  BUN 22 25* 25* 26*  60* 73* 51* 59* 83* 109* 66*  CREATININE 0.57 0.52 0.54 0.44 1.10* 1.34* 1.02* 1.20* 1.47* 1.49* 0.99  CALCIUM 8.4* 8.4* 8.5* 8.6* 8.7* 8.8* 8.5* 8.1* 8.1* 8.6* 8.4*  PHOS 2.4* 1.6* 1.4* 1.9* 3.9 3.5 3.3  --   --   --   --    CBC Recent Labs  Lab 06/16/22 0507 06/17/22 0450 06/18/22 0438 06/19/22 0447  WBC 11.0* 10.2 8.1 6.1  HGB 7.6* 7.6* 8.2* 8.1*  HCT 24.7* 24.6* 26.1* 26.8*  MCV 87.3 86.9 84.7 87.9  PLT 99* 109* 130* 149*    Medications:     Chlorhexidine Gluconate Cloth  6 each Topical Daily   docusate  100 mg Per Tube BID   fiber supplement (BANATROL TF)  60 mL Per Tube  BID   furosemide  80 mg Intravenous Daily   insulin aspart  0-20 Units Subcutaneous Q4H   insulin aspart  8 Units Subcutaneous Q4H   insulin detemir  22 Units Subcutaneous BID   midodrine  10 mg Per Tube TID   modafinil  100 mg Per Tube Daily   multivitamin  1 tablet Per Tube QHS   mouth rinse  15 mL Mouth Rinse Q2H   pantoprazole  40 mg Per Tube Daily   polyethylene glycol  17 g Per Tube Daily   sodium chloride flush  10-40 mL Intracatheter Q12H   warfarin  3 mg Per Tube q1600   Warfarin - Pharmacist Dosing Inpatient   Does not apply q1600    Madelon Lips, MD 06/19/2022, 10:10 AM

## 2022-06-20 DIAGNOSIS — J189 Pneumonia, unspecified organism: Secondary | ICD-10-CM

## 2022-06-20 DIAGNOSIS — J9601 Acute respiratory failure with hypoxia: Secondary | ICD-10-CM | POA: Diagnosis not present

## 2022-06-20 DIAGNOSIS — G959 Disease of spinal cord, unspecified: Secondary | ICD-10-CM | POA: Diagnosis not present

## 2022-06-20 DIAGNOSIS — G039 Meningitis, unspecified: Secondary | ICD-10-CM | POA: Diagnosis not present

## 2022-06-20 DIAGNOSIS — A419 Sepsis, unspecified organism: Secondary | ICD-10-CM

## 2022-06-20 DIAGNOSIS — G9341 Metabolic encephalopathy: Secondary | ICD-10-CM | POA: Diagnosis not present

## 2022-06-20 DIAGNOSIS — D329 Benign neoplasm of meninges, unspecified: Secondary | ICD-10-CM | POA: Diagnosis not present

## 2022-06-20 LAB — BASIC METABOLIC PANEL
Anion gap: 11 (ref 5–15)
BUN: 86 mg/dL — ABNORMAL HIGH (ref 8–23)
CO2: 31 mmol/L (ref 22–32)
Calcium: 8 mg/dL — ABNORMAL LOW (ref 8.9–10.3)
Chloride: 98 mmol/L (ref 98–111)
Creatinine, Ser: 1.06 mg/dL — ABNORMAL HIGH (ref 0.44–1.00)
GFR, Estimated: 58 mL/min — ABNORMAL LOW (ref >60)
Glucose, Bld: 209 mg/dL — ABNORMAL HIGH (ref 70–99)
Potassium: 3.2 mmol/L — ABNORMAL LOW (ref 3.5–5.1)
Sodium: 140 mmol/L (ref 135–145)

## 2022-06-20 LAB — CBC
HCT: 25.4 % — ABNORMAL LOW (ref 36.0–46.0)
Hemoglobin: 7.7 g/dL — ABNORMAL LOW (ref 12.0–15.0)
MCH: 26.6 pg (ref 26.0–34.0)
MCHC: 30.3 g/dL (ref 30.0–36.0)
MCV: 87.9 fL (ref 80.0–100.0)
Platelets: 190 10*3/uL (ref 150–400)
RBC: 2.89 MIL/uL — ABNORMAL LOW (ref 3.87–5.11)
RDW: 21 % — ABNORMAL HIGH (ref 11.5–15.5)
WBC: 4.7 10*3/uL (ref 4.0–10.5)
nRBC: 0 % (ref 0.0–0.2)

## 2022-06-20 LAB — GLUCOSE, CAPILLARY
Glucose-Capillary: 192 mg/dL — ABNORMAL HIGH (ref 70–99)
Glucose-Capillary: 229 mg/dL — ABNORMAL HIGH (ref 70–99)
Glucose-Capillary: 192 mg/dL — ABNORMAL HIGH (ref 70–99)
Glucose-Capillary: 206 mg/dL — ABNORMAL HIGH (ref 70–99)

## 2022-06-20 LAB — PROTIME-INR
INR: 1.8 — ABNORMAL HIGH (ref 0.8–1.2)
Prothrombin Time: 20.5 seconds — ABNORMAL HIGH (ref 11.4–15.2)

## 2022-06-20 MED ORDER — INSULIN ASPART 100 UNIT/ML IJ SOLN
10.0000 [IU] | INTRAMUSCULAR | Status: DC
  Administered 2022-06-20 – 2022-06-25 (×27): 10 [IU] via SUBCUTANEOUS

## 2022-06-20 MED ORDER — WARFARIN SODIUM 5 MG PO TABS
5.0000 mg | ORAL_TABLET | Freq: Once | ORAL | Status: AC
  Administered 2022-06-20: 5 mg via ORAL
  Filled 2022-06-20: qty 1

## 2022-06-20 MED ORDER — POTASSIUM CHLORIDE 20 MEQ PO PACK
40.0000 meq | PACK | Freq: Once | ORAL | Status: AC
Start: 2022-06-20 — End: 2022-06-20
  Administered 2022-06-20: 40 meq
  Filled 2022-06-20: qty 2

## 2022-06-20 NOTE — Progress Notes (Signed)
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    Discharge summary reviewed: She initially presented to OSH on 05/20/22 for left upper extremity tremors. She reported recent hand foot any mouth disease contracted from her grandchild that left her weak and tired prior to this admission. Neurology work-up including MRI, EEG negative. MRI/MRA neg for acute infarct, 13 mm focus in left frontal lobe representing artifact vs small calcified meningioma, chronic small vessel ischemic changes, cerebral atrophy, high-grade stenosis in bilateral ICA, focal stenosis in left ACA and intracranial right vertebral artery. MRI of C-spine showing C4-5 cord compression from advanced disc degeneration with posterior endplate ridging.  Showing left foraminal impingement at the same level.  Also showing C5-6 and C6-7 ADCF with solid arthrodesis.  Neurologist at [facility] had recommended neurosurgical evaluation.   She developed fevers and transferred to ICU concerning for meningitis and started on Vanc, rocephin, ampicillin and acyclovir. LP was performed on 7/24 with WBC 55 RBC 2. Pending herpes, enterovirus, ehrlichia. CT A/P showed bilateral ground glass and lung opacities concerning for multifocal pneumonia.    PCCM consulted for decreased responsiveness and hypoxemia on NRB. On exam obtunded and only able to say name. O2 sats in the 70s on NRB. Transferred to the ICU for emergent intubation  7/24 Admitted to [facility] from [place]. Transferred to PCCM when found in respiratory failure 7/25 intubated, bronched, art line placed, PICC placed.  Neuro and ID consulted. Started decadron  7/26  decadron dc/d 7/27 felt most consistent with viral meningoencephalitis  7/29-7/30 sudden onset multiorgan failure; given tpa with improvement 8/4 trach 8/6 trach bleeding 8/12 off sedation awaiting any neurologic response. 8/15 transitioned off CRRT to iHD 8/16 tolerating trach collar 8/20 trach exchange to cuffless trach  Assessment and Plan: 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 Preceding HFMD, CSF +enterovirus.  - Continue supportive care, has completed course of antiviral. Also received empiric bacterial meningitis coverage.  - Started modafinil for neurostimulation - Encephalopathy continues to be severe. Weaned from sedation since 8/5. Husband at bedside this AM shared that she would not want to live indefinitely like this. D/w Dr. Hortense Ramal who suggests watching an additional week for neurological recovery, then reassessing with [place] discussions.   Focal convulsive status epilepticus: Resolved. Precipitated by viral encephalitis, +/- meningioma.  - Continue keppra and vimpat per neurology  Critical cervical myopathy s/p ACDF C5-C7:  - Completed steroids.    Acute renal failure: Initiated CRRT 7/30 > 8/15 in setting of shock. First iHD 8/16.  - Nephrology following, UOP appears to be picking up, electrolytes stable, holding HD today.  - Avoid nephrotoxins, support hemodynamically, monitor metabolic profile  Hypokalemia: Due to lasix challenge. - Supplement and monitor   Acute hypoxemic/hypercapnic respiratory failure due to multilobar pneumonia: s/p tracheostomy. Completed meropenem. - Continue trach care per PCCM, exchanged to cuffless 8/20.    Small lymphocytic B cell non-hodgkin lymphoma Hypogammaglobinemia: Patient on rituximab (last dose June 2023) contributing to immunosuppressed status.  - Dr. Marin Olp aware and following periodically.  - s/p IVIG empirically with little improvement.    T2DM: HbA1c 8%.  - Continue CBGs and insulin  coverage q4h for tube feeds, increase  8u > 10u TF coverage today.    Sudden onset shock, acute renal failure, acute liver failure, RV dilation presumed massive PE (7/29-7/30): s/p tpa with good improvement in hemodynamics; now off presors - Continue coumadin.  - Midodrine to support BP through HD   Dysphagia: s/p PEG - Continue TFs  Subjective: Husband at bedside, stating no progress lately. Has eyes open but won't look at anything in particular or seem to respond to voice. Will move from pain he says.  Objective: Vitals:   06/20/22 1000 06/20/22 1100 06/20/22 1140 06/20/22 1200  BP: (!) 126/51 (!) 123/54 (!) 123/54 (!) 133/55  Pulse: (!) 109 (!) 103 (!) 103 (!) 106  Resp: (!) 29 (!) 30 (!) 30 (!) 34  Temp:    100 F (37.8 C)  TempSrc:    Axillary  SpO2: 99% 97% 98% 98%  Weight:      Height:       Gen: Critically ill-appearing 66 y.o. female in no distress Neck: Trach site c/d/i Pulm: Nonlabored thru trach. Clear CV: Regular tachycardia without murmur, rub, or gallop. No JVD, trace dependent edema. GI: Abdomen soft, non-tender, non-distended, with normoactive bowel sounds. PEG site c/d/I under abd binder  Ext: Warm, flaccid, no deformities Skin: No acute-appearing rashes, lesions or ulcers on visualized skin. Right IJ cath site c/d/I, LUA IV c/d/i Neuro: Opens eyes spontaneously, does not fixate. Has left gaze preference, no purposeful movements.   Data Personally reviewed: CBC: Recent Labs  Lab 06/16/22 0507 06/17/22 0450 06/18/22 0438 06/19/22 0447 06/20/22 0610  WBC 11.0* 10.2 8.1 6.1 4.7  HGB 7.6* 7.6* 8.2* 8.1* 7.7*  HCT 24.7* 24.6* 26.1* 26.8* 25.4*  MCV 87.3 86.9 84.7 87.9 87.9  PLT 99* 109* 130* 149* 027   Basic Metabolic Panel: Recent Labs  Lab 06/14/22 0441 06/14/22 1555 06/15/22 0431 06/15/22 0946 06/16/22 0827 06/16/22 0828 06/16/22 1349 06/17/22 0450 06/18/22 0438 06/19/22 0447 06/20/22 0610  NA 138 138 134* 137  --  135 134* 136 136 137 140   K 4.4 4.8 6.0* 5.6*  --  3.3* 3.1* 4.1 3.6 3.5 3.2*  CL 104 103 102 102  --  96* 96* 96* 94* 96* 98  CO2 '28 28 25 24  '$ --  '28 26 26 27 30 31  '$ GLUCOSE 168* 179* 358* 300*  --  81 126* 228* 192* 236* 209*  BUN 25* 26* 60* 73*  --  51* 59* 83* 109* 66* 86*  CREATININE 0.54 0.44 1.10* 1.34*  --  1.02* 1.20* 1.47* 1.49* 0.99 1.06*  CALCIUM 8.5* 8.6* 8.7* 8.8*  --  8.5* 8.1* 8.1* 8.6* 8.4* 8.0*  MG 2.4  --  2.6*  --  1.8  --   --   --   --   --   --   PHOS 1.4* 1.9* 3.9 3.5  --  3.3  --   --   --   --   --    GFR: Estimated Creatinine Clearance: 48.2 mL/min (A) (by C-G formula based on SCr of 1.06 mg/dL (H)). Liver Function Tests: Recent Labs  Lab 06/14/22 1555 06/15/22 0431 06/15/22 0946 06/16/22 0828 06/17/22 0850  AST  --   --   --   --  32  ALT  --   --   --   --  54*  ALKPHOS  --   --   --   --  111  BILITOT  --   --   --   --  0.6  PROT  --   --   --   --  5.1*  ALBUMIN 2.6* 2.5* 2.4* 2.6* 2.2*   No results for input(s): "LIPASE", "AMYLASE" in the last 168 hours. Recent Labs  Lab 06/17/22 0850  AMMONIA 37*   Coagulation Profile: Recent Labs  Lab 06/16/22 0507 06/17/22 0450 06/18/22 0438 06/19/22 0447 06/20/22 0610  INR 2.5* 2.2* 2.4* 2.1* 1.8*   Family Communication: Husband at bedside  Disposition: Status is: Inpatient Remains inpatient appropriate because: Severity of illness Planned Discharge Destination:  TBD based on neurological recovery and goals of care discussions.   Patrecia Pour, MD 06/20/2022 3:07 PM Page by [email or web address]

## 2022-06-20 NOTE — Procedures (Signed)
Tracheostomy Change Note  Patient Details:   Name: Valerie Cline DOB: 1956/09/19 MRN: [id number]    Airway Documentation:     Evaluation  O2 sats: stable throughout Complications: No apparent complications Patient did tolerate procedure well. Bilateral Breath Sounds: Rhonchi, Diminished   Positive color change on ETCO2 detector.  [place] Amber Prestyn Stanco 06/20/2022, 4:11 PM

## 2022-06-20 NOTE — Progress Notes (Signed)
[place] for heparin > warfarin  Indication: pulmonary embolus  Allergies  Allergen Reactions   Canagliflozin Other (See Comments)    recurrent vaginitis   Glucophage [Metformin] Other (See Comments)    GI intolerance   Niaspan [Niacin] Other (See Comments)    flushing    Patient Measurements: Height: 5' 5.98" (167.6 cm) Weight: 68.6 kg (151 lb 3.8 oz) IBW/kg (Calculated) : 59.25 Heparin Dosing Weight: 79kg  Vital Signs: Temp: 100 F (37.8 C) (08/21 1200) Temp Source: Axillary (08/21 1200) BP: 123/54 (08/21 1140) Pulse Rate: 103 (08/21 1140)  Labs: Recent Labs    06/18/22 0438 06/19/22 0447 06/20/22 0610  HGB 8.2* 8.1* 7.7*  HCT 26.1* 26.8* 25.4*  PLT 130* 149* 190  LABPROT 25.8* 23.4* 20.5*  INR 2.4* 2.1* 1.8*  CREATININE 1.49* 0.99 1.06*     Estimated Creatinine Clearance: 48.2 mL/min (A) (by C-G formula based on SCr of 1.06 mg/dL (H)).   Assessment: 23 yoF admitted with meningitis from OSH. Pt with ongoing fevers and worsening clinical status 7/29, RV severely down on beside ECHO so concerns for acute PE. Pt received alteplase on 7/30 for suspected saddle PE.    Heparin stopped 8/14 with therapeutic INR. INR today is slightly subtherapeutic at 1.8. Hgb has remained stable 7-8, plts continue to trend up, now at 190. No s/sx of bleeding. Pt has been transitioned from CRRT to [facility] - last 8/19.   Goal of Therapy:  INR 2-3 Monitor platelets by anticoagulation protocol: Yes   Plan:  Warfarin 5 mg tonight to get INR in goal range - will hold on considering heparin gtt unless INR continues to remain subtherapeutic    Daily heparin level, INR, and CBC  Monitor for s/sx bleeding   Antonietta Jewel, PharmD, BCCCP Clinical Pharmacist  Phone: [phone] 06/20/2022 12:16 PM  Please check AMION for all [place] phone numbers After 10:00 PM, call [place] [phone]

## 2022-06-20 NOTE — Progress Notes (Signed)
Subjective: No acute events overnight.  Will open eyes to repeated tactile and verbal stimulation but does not follow commands.  Transferring out of ICU to floor today  ROS: Unable to obtain due to poor mental status  Examination  Vital signs in last 24 hours: Temp:  [98 F (36.7 C)-99.4 F (37.4 C)] 99.4 F (37.4 C) (08/21 0800) Pulse Rate:  [81-114] 109 (08/21 1000) Resp:  [17-88] 29 (08/21 1000) BP: (111-192)/(41-78) 126/51 (08/21 1000) SpO2:  [90 %-100 %] 99 % (08/21 1000) FiO2 (%):  [30 %-60 %] 35 % (08/21 0743)  General: lying in bed, N+trach and peg Neuro: Opens eyes to repeated tactile stimulation, does not follow commands, does not track, appears to have roving eye movements, at times with right beating nystagmus, left gaze preference but is able to cross midline with oculocephalics, winces to noxious stimuli in all extremities with right hemiplegia  Basic Metabolic Panel: Recent Labs  Lab 06/14/22 0441 06/14/22 1555 06/15/22 0431 06/15/22 0946 06/16/22 0827 06/16/22 0828 06/16/22 1349 06/17/22 0450 06/18/22 0438 06/19/22 0447 06/20/22 0610  NA 138 138 134* 137  --  135 134* 136 136 137 140  K 4.4 4.8 6.0* 5.6*  --  3.3* 3.1* 4.1 3.6 3.5 3.2*  CL 104 103 102 102  --  96* 96* 96* 94* 96* 98  CO2 '28 28 25 24  '$ --  '28 26 26 27 30 31  '$ GLUCOSE 168* 179* 358* 300*  --  81 126* 228* 192* 236* 209*  BUN 25* 26* 60* 73*  --  51* 59* 83* 109* 66* 86*  CREATININE 0.54 0.44 1.10* 1.34*  --  1.02* 1.20* 1.47* 1.49* 0.99 1.06*  CALCIUM 8.5* 8.6* 8.7* 8.8*  --  8.5* 8.1* 8.1* 8.6* 8.4* 8.0*  MG 2.4  --  2.6*  --  1.8  --   --   --   --   --   --   PHOS 1.4* 1.9* 3.9 3.5  --  3.3  --   --   --   --   --     CBC: Recent Labs  Lab 06/16/22 0507 06/17/22 0450 06/18/22 0438 06/19/22 0447 06/20/22 0610  WBC 11.0* 10.2 8.1 6.1 4.7  HGB 7.6* 7.6* 8.2* 8.1* 7.7*  HCT 24.7* 24.6* 26.1* 26.8* 25.4*  MCV 87.3 86.9 84.7 87.9 87.9  PLT 99* 109* 130* 149* 190     Coagulation  Studies: Recent Labs    06/18/22 0438 06/19/22 0447 06/20/22 0610  LABPROT 25.8* 23.4* 20.5*  INR 2.4* 2.1* 1.8*    Imaging No new brain imaging overnight  ASSESSMENT AND PLAN: 66 year old female with suspected viral meningo-encephalitis (had hand-foot-and-mouth disease prior to presentation) now with status epilepticus.   Focal convulsive status epilepticus, resolved Provoked seizures Acute encephalopathy, infectious Entero-Viral meningo-encephalitis Left meningioma Acute ischemic stroke, incidental Obstructive hydrocephalus Immunocompromised  History of small lymphocytic like B-cell non-Hodgkin lymphoma -Patient had exposure to hand-foot-and-mouth disease and subsequently enterovirus encephalitis.   -Status epilepticus likely due to viral encephalitis, meningioma (meningiomas do not commonly cause status epilepticus but the location of this meningioma is concerning).   -Ischemic strokes and likely due to encephalitis   Recommendations -New Modafinil 100 mg daily for neurostimulation -Continue Keppra to 1000 mg daily as well as an additional dose of 500 mg after dialysis (on M-W-F) -Continue Vimpat 200 mg twice daily and an additional dose of 100 mg after dialysis ( M-W-F) -Patient has been off sedation since 06/04/2022 (weaned off Versed  on 06/03/2022)   Her MRI appears to be improving, she has not had any seizures, she is not on very high dose of AEDs which could contribute to sedation, her renal and respiratory function is improving.  However, on exam she continues to be minimally responsive (opens eyes but does not track, does not follow commands and does not have any other meaningful interaction).  I discussed with husband at bedside that neurological recovery happens at a much higher rate in the first 2 to 4 weeks and usually plateaus after that.  Therefore, plan for goals of care meeting next week may be on Monday or Tuesday to see if patient has had any sustained improvement in  neurologic status -Continue management of rest of comorbidities per primary team - Discussed plan with husband at bedside as well as Dr. Bonner Puna   I have spent a total of 38 minutes with the patient reviewing hospital notes,  test results, labs and examining the patient as well as establishing an assessment and plan.  > 50% of time was spent in direct patient care.    Zeb Comfort Epilepsy [facility] For questions after 5pm please refer to AMION to reach the Neurologist on call

## 2022-06-20 NOTE — Progress Notes (Signed)
[facility] Progress Note   Assessment/ Plan:   AKI b/l creat 0.8 on admission. Prolonged dialysis dependent; started CRRT 7/30 then HD 06/15/22. Pt admitted for viral meningoencephalitis complicated by critical cervical myopathy and now resp failure w/ multifocal PNA. Pt has lymphoma receiving active chemoRx (rituximab) w/ last dose in June 2023.  No HD today, seems to be recovering Reassess tomorrow  Shock - stable, no pressors H/o lymphoma - getting active chemoRx w/ last dose rituxan in June 2023 AHRF - s/p trach, on TC now AMS - in setting of viral meningoencephalitis. Prolonged encephalopathy; Neurology and ID following.   Cervical myelopathy  DM2 - on insulin Disposition in ICU,  Subjective:    1.8L UOP yesterday SCr 1.06, BUN 86 on enteral nutrition, K 3.2    Objective:   BP 136/62   Pulse (!) 107   Temp 99.4 F (37.4 C) (Axillary)   Resp (!) 27   Ht 5' 5.98" (1.676 m)   Wt 68.6 kg   SpO2 96%   BMI 24.42 kg/m   Intake/Output Summary (Last 24 hours) at 06/20/2022 0913 Last data filed at 06/20/2022 0700 Gross per 24 hour  Intake 1270 ml  Output 1710 ml  Net -440 ml    Weight change:   Physical Exam: Gen: NAD< not awake/interactive CVS: RRR Resp: clear Abd: soft Ext: no LE edema ACCESS: IJ HD cath  Imaging: No results found.  Labs: BMET Recent Labs  Lab 06/13/22 1655 06/14/22 0441 06/14/22 1555 06/15/22 0431 06/15/22 0946 06/16/22 0828 06/16/22 1349 06/17/22 0450 06/18/22 0438 06/19/22 0447 06/20/22 0610  NA 138 138 138 134* 137 135 134* 136 136 137 140  K 4.5 4.4 4.8 6.0* 5.6* 3.3* 3.1* 4.1 3.6 3.5 3.2*  CL 105 104 103 102 102 96* 96* 96* 94* 96* 98  CO'2 27 28 28 25 24 28 26 26 27 '$ 30 31  GLUCOSE 201* 168* 179* 358* 300* 81 126* 228* 192* 236* 209*  BUN 25* 25* 26* 60* 73* 51* 59* 83* 109* 66* 86*  CREATININE 0.52 0.54 0.44 1.10* 1.34* 1.02* 1.20* 1.47* 1.49* 0.99 1.06*  CALCIUM 8.4* 8.5* 8.6* 8.7* 8.8* 8.5* 8.1* 8.1* 8.6*  8.4* 8.0*  PHOS 1.6* 1.4* 1.9* 3.9 3.5 3.3  --   --   --   --   --     CBC Recent Labs  Lab 06/17/22 0450 06/18/22 0438 06/19/22 0447 06/20/22 0610  WBC 10.2 8.1 6.1 4.7  HGB 7.6* 8.2* 8.1* 7.7*  HCT 24.6* 26.1* 26.8* 25.4*  MCV 86.9 84.7 87.9 87.9  PLT 109* 130* 149* 190     Medications:     Chlorhexidine Gluconate Cloth  6 each Topical Daily   docusate  100 mg Per Tube BID   fiber supplement (BANATROL TF)  60 mL Per Tube BID   furosemide  80 mg Intravenous Daily   insulin aspart  0-20 Units Subcutaneous Q4H   insulin aspart  8 Units Subcutaneous Q4H   insulin detemir  22 Units Subcutaneous BID   midodrine  10 mg Per Tube TID   modafinil  100 mg Per Tube Daily   multivitamin  1 tablet Per Tube QHS   mouth rinse  15 mL Mouth Rinse Q2H   pantoprazole  40 mg Per Tube Daily   polyethylene glycol  17 g Per Tube Daily   sodium chloride flush  10-40 mL Intracatheter Q12H   warfarin  3 mg Per Tube q1600   Warfarin -  Pharmacist Dosing Inpatient   Does not apply [place], MD  06/20/2022, 9:13 AM

## 2022-06-20 NOTE — Inpatient Diabetes Management (Signed)
Inpatient Diabetes Program Recommendations  AACE/ADA: New Consensus Statement on Inpatient Glycemic Control (2015)  Target Ranges:  Prepandial:   less than 140 mg/dL      Peak postprandial:   less than 180 mg/dL (1-2 hours)      Critically ill patients:  140 - 180 mg/dL   Lab Results  Component Value Date   GLUCAP 192 (H) 06/20/2022   HGBA1C 8.0 (H) 05/24/2022    Review of Glycemic Control  Latest Reference Range & Units 06/20/22 04:44 06/20/22 08:54 06/20/22 12:03  Glucose-Capillary 70 - 99 mg/dL 192 (H) 229 (H) 192 (H)   Diabetes history: DM 2 Outpatient Diabetes medications:  U500 insulin 80 units with breakfast, 50 units with lunch, and and 90 units with supper Current orders for Inpatient glycemic control:  Vital 65 ml/hr Levemir 22 units bid Novolog 0-20 units q 4 hours Novolog 8 units q 4 hours   Inpatient Diabetes Program Recommendations:    Consider slight increase of Novolog to 10 units q 4 hours for tube feed coverage.   Thanks,  Adah Perl, RN, BC-ADM Inpatient Diabetes Coordinator Pager [phone]  (8a-5p)

## 2022-06-21 ENCOUNTER — Inpatient Hospital Stay (HOSPITAL_COMMUNITY): Payer: Medicare Other

## 2022-06-21 DIAGNOSIS — D329 Benign neoplasm of meninges, unspecified: Secondary | ICD-10-CM | POA: Diagnosis not present

## 2022-06-21 DIAGNOSIS — G959 Disease of spinal cord, unspecified: Secondary | ICD-10-CM | POA: Diagnosis not present

## 2022-06-21 DIAGNOSIS — G9341 Metabolic encephalopathy: Secondary | ICD-10-CM | POA: Diagnosis not present

## 2022-06-21 DIAGNOSIS — G039 Meningitis, unspecified: Secondary | ICD-10-CM | POA: Diagnosis not present

## 2022-06-21 DIAGNOSIS — J9601 Acute respiratory failure with hypoxia: Secondary | ICD-10-CM | POA: Diagnosis not present

## 2022-06-21 LAB — BASIC METABOLIC PANEL
Anion gap: 15 (ref 5–15)
BUN: 93 mg/dL — ABNORMAL HIGH (ref 8–23)
CO2: 31 mmol/L (ref 22–32)
Calcium: 8.4 mg/dL — ABNORMAL LOW (ref 8.9–10.3)
Chloride: 98 mmol/L (ref 98–111)
Creatinine, Ser: 1.07 mg/dL — ABNORMAL HIGH (ref 0.44–1.00)
GFR, Estimated: 57 mL/min — ABNORMAL LOW (ref >60)
Glucose, Bld: 181 mg/dL — ABNORMAL HIGH (ref 70–99)
Potassium: 3.4 mmol/L — ABNORMAL LOW (ref 3.5–5.1)
Sodium: 144 mmol/L (ref 135–145)

## 2022-06-21 LAB — URINALYSIS, ROUTINE W REFLEX MICROSCOPIC
Bilirubin Urine: NEGATIVE
Glucose, UA: NEGATIVE mg/dL
Ketones, ur: NEGATIVE mg/dL
Nitrite: NEGATIVE
Protein, ur: NEGATIVE mg/dL
Specific Gravity, Urine: 1.008 (ref 1.005–1.030)
pH: 5 (ref 5.0–8.0)

## 2022-06-21 LAB — GLUCOSE, CAPILLARY
Glucose-Capillary: 172 mg/dL — ABNORMAL HIGH (ref 70–99)
Glucose-Capillary: 177 mg/dL — ABNORMAL HIGH (ref 70–99)
Glucose-Capillary: 181 mg/dL — ABNORMAL HIGH (ref 70–99)
Glucose-Capillary: 184 mg/dL — ABNORMAL HIGH (ref 70–99)
Glucose-Capillary: 213 mg/dL — ABNORMAL HIGH (ref 70–99)
Glucose-Capillary: 224 mg/dL — ABNORMAL HIGH (ref 70–99)

## 2022-06-21 LAB — CBC
HCT: 26.2 % — ABNORMAL LOW (ref 36.0–46.0)
Hemoglobin: 8 g/dL — ABNORMAL LOW (ref 12.0–15.0)
MCH: 26.5 pg (ref 26.0–34.0)
MCHC: 30.5 g/dL (ref 30.0–36.0)
MCV: 86.8 fL (ref 80.0–100.0)
Platelets: 244 10*3/uL (ref 150–400)
RBC: 3.02 MIL/uL — ABNORMAL LOW (ref 3.87–5.11)
RDW: 20.6 % — ABNORMAL HIGH (ref 11.5–15.5)
WBC: 4.4 10*3/uL (ref 4.0–10.5)
nRBC: 0 % (ref 0.0–0.2)

## 2022-06-21 LAB — PROTIME-INR
INR: 1.7 — ABNORMAL HIGH (ref 0.8–1.2)
Prothrombin Time: 19.8 seconds — ABNORMAL HIGH (ref 11.4–15.2)

## 2022-06-21 MED ORDER — LEVETIRACETAM 500 MG/5ML IV SOLN
750.0000 mg | Freq: Two times a day (BID) | INTRAVENOUS | Status: DC
  Administered 2022-06-21 – 2022-06-22 (×2): 750 mg via INTRAVENOUS
  Filled 2022-06-21 (×3): qty 7.5

## 2022-06-21 MED ORDER — POTASSIUM CHLORIDE 20 MEQ PO PACK
20.0000 meq | PACK | Freq: Once | ORAL | Status: AC
  Administered 2022-06-21: 20 meq
  Filled 2022-06-21: qty 1

## 2022-06-21 MED ORDER — WARFARIN SODIUM 5 MG PO TABS
5.0000 mg | ORAL_TABLET | Freq: Once | ORAL | Status: AC
  Administered 2022-06-21: 5 mg via ORAL
  Filled 2022-06-21: qty 1

## 2022-06-21 NOTE — TOC Progression Note (Signed)
Transition of Care [facility]) - Progression Note    Patient Details  Name: Valerie Cline MRN: [id number] Date of Birth: Jan 28, 1956  Transition of Care [facility]) CM/SW Contact  Graves-Bigelow, Ocie Cornfield, RN Phone Number: 06/21/2022, 11:22 AM  Clinical Narrative:  Patient was discussed in progression rounds. Patient presented for acute infectious encephalopathy in setting of viral meningoencephalitis, has trach and peg. Husband at the bedside this morning. [place] meeting planned for next week to see if patient has any improvement in neurological recovery. Case Manager continuing to follow the patient for disposition needs.   Expected Discharge Plan:  (TBD) Barriers to Discharge: Continued Medical Work up  Expected Discharge Plan and Services Expected Discharge Plan:  (TBD)   Discharge Planning Services: CM Consult   Living arrangements for the past 2 months: Single Family Home                   DME Agency: NA     Readmission Risk Interventions     No data to display

## 2022-06-21 NOTE — Progress Notes (Signed)
Subjective: NAEO. Husband at bedside.   ROS: Unable to obtain due to poor mental status  Examination  Vital signs in last 24 hours: Temp:  [98.5 F (36.9 C)-101 F (38.3 C)] 101 F (38.3 C) (08/22 0800) Pulse Rate:  [63-117] 117 (08/22 0800) Resp:  [21-50] 42 (08/22 0800) BP: (87-178)/(40-74) 143/64 (08/22 0800) SpO2:  [90 %-100 %] 97 % (08/22 0800) FiO2 (%):  [35 %] 35 % (08/22 0800)  General: lying in bed, +trach and peg Neuro: awake, looking around in room and occasionally looks at examiner but does not follow commands and does not track, appears to have roving eye movements, at times with right beating nystagmus, left gaze preference but is able to cross midline with oculocephalics, winces to noxious stimuli in LUE, no response to noxious stimuli in all other extremities  Basic Metabolic Panel: Recent Labs  Lab 06/14/22 1555 06/15/22 0431 06/15/22 0946 06/16/22 0827 06/16/22 0828 06/16/22 1349 06/17/22 0450 06/18/22 0438 06/19/22 0447 06/20/22 0610 06/21/22 0319  NA 138 134* 137  --  135   < > 136 136 137 140 144  K 4.8 6.0* 5.6*  --  3.3*   < > 4.1 3.6 3.5 3.2* 3.4*  CL 103 102 102  --  96*   < > 96* 94* 96* 98 98  CO2 '28 25 24  '$ --  28   < > '26 27 30 31 31  '$ GLUCOSE 179* 358* 300*  --  81   < > 228* 192* 236* 209* 181*  BUN 26* 60* 73*  --  51*   < > 83* 109* 66* 86* 93*  CREATININE 0.44 1.10* 1.34*  --  1.02*   < > 1.47* 1.49* 0.99 1.06* 1.07*  CALCIUM 8.6* 8.7* 8.8*  --  8.5*   < > 8.1* 8.6* 8.4* 8.0* 8.4*  MG  --  2.6*  --  1.8  --   --   --   --   --   --   --   PHOS 1.9* 3.9 3.5  --  3.3  --   --   --   --   --   --    < > = values in this interval not displayed.    CBC: Recent Labs  Lab 06/17/22 0450 06/18/22 0438 06/19/22 0447 06/20/22 0610 06/21/22 0319  WBC 10.2 8.1 6.1 4.7 4.4  HGB 7.6* 8.2* 8.1* 7.7* 8.0*  HCT 24.6* 26.1* 26.8* 25.4* 26.2*  MCV 86.9 84.7 87.9 87.9 86.8  PLT 109* 130* 149* 190 244     Coagulation Studies: Recent Labs     06/19/22 0447 06/20/22 0610 06/21/22 0319  LABPROT 23.4* 20.5* 19.8*  INR 2.1* 1.8* 1.7*    Imaging No new brain imaging overnight   ASSESSMENT AND PLAN: 66 year old female with suspected viral meningo-encephalitis (had hand-foot-and-mouth disease prior to presentation) now with status epilepticus.   Focal convulsive status epilepticus, resolved Provoked seizures Acute encephalopathy, infectious Entero-Viral meningo-encephalitis Left meningioma Acute ischemic stroke, incidental Obstructive hydrocephalus Immunocompromised  History of small lymphocytic like B-cell non-Hodgkin lymphoma -Patient had exposure to hand-foot-and-mouth disease and subsequently enterovirus encephalitis.   -Status epilepticus likely due to viral encephalitis, meningioma (meningiomas do not commonly cause status epilepticus but the location of this meningioma is concerning).   -Ischemic strokes and likely due to encephalitis   Recommendations -Continue Modafinil 100 mg daily for neurostimulation -Renal function improving.  Therefore will switch Keppra to 750 mg twice daily and continue Vimpat 200 mg  twice daily. -Continue management of rest of comorbidities per primary team - Discussed plan with husband at bedside as well as pharmacist   I have spent a total of 36 minutes with the patient reviewing hospital notes,  test results, labs and examining the patient as well as establishing an assessment and plan.  > 50% of time was spent in direct patient care.   Zeb Comfort Epilepsy [facility] For questions after 5pm please refer to AMION to reach the Neurologist on call

## 2022-06-21 NOTE — Progress Notes (Signed)
[place] for heparin > warfarin  Indication: pulmonary embolus  Allergies  Allergen Reactions   Canagliflozin Other (See Comments)    recurrent vaginitis   Glucophage [Metformin] Other (See Comments)    GI intolerance   Niaspan [Niacin] Other (See Comments)    flushing    Patient Measurements: Height: 5' 5.98" (167.6 cm) Weight: 68.6 kg (151 lb 3.8 oz) IBW/kg (Calculated) : 59.25 Heparin Dosing Weight: 79kg  Vital Signs: Temp: 101 F (38.3 C) (08/22 0800) Temp Source: Axillary (08/22 0800) BP: 143/64 (08/22 0800) Pulse Rate: 117 (08/22 0800)  Labs: Recent Labs    06/19/22 0447 06/20/22 0610 06/21/22 0319  HGB 8.1* 7.7* 8.0*  HCT 26.8* 25.4* 26.2*  PLT 149* 190 244  LABPROT 23.4* 20.5* 19.8*  INR 2.1* 1.8* 1.7*  CREATININE 0.99 1.06* 1.07*     Estimated Creatinine Clearance: 47.8 mL/min (A) (by C-G formula based on SCr of 1.07 mg/dL (H)).   Assessment: 23 yoF admitted with meningitis from OSH. Pt with ongoing fevers and worsening clinical status 7/29, RV severely down on beside ECHO so concerns for acute PE. Pt received alteplase on 7/30 for suspected saddle PE.    Heparin stopped 8/14 with therapeutic INR. INR today is remains slightly subtherapeutic at 1.7. Hgb has remained stable 7-8s, plts continue to trend up, now at 244. No s/sx of bleeding. Pt has been transitioned from CRRT to [facility] - last 8/19, currently no plans for iHD and just allow renal to recover.   Goal of Therapy:  INR 2-3 Monitor platelets by anticoagulation protocol: Yes   Plan:  Warfarin 5 mg tonight again - will hold on considering heparin gtt unless INR continues to remain subtherapeutic    Daily heparin level, INR, and CBC  Monitor for s/sx bleeding   Antonietta Jewel, PharmD, [place] Pharmacist  Phone: [phone] 06/21/2022 9:33 AM  Please check AMION for all [place] phone numbers After 10:00 PM, call [place] [phone]

## 2022-06-21 NOTE — Progress Notes (Signed)
Progress Note  Patient: Valerie Cline WCH:[id number] DOB: 1956/06/21  DOA: 05/14/2022  DOS: 06/21/2022    Brief hospital course: Valerie Cline is a 66 year old female with small cell lymphocytic B cell non-Hodgkin's lymphoma on Rituxan since 2020 (last dose in June 2023), HTN, HLD, DM, CAD, carotid stenosis, recent hand-foot-and mouth disease transferred from [place] to [facility].    Discharge summary reviewed: She initially presented to OSH on 05/20/22 for left upper extremity tremors. She reported recent hand foot any mouth disease contracted from her grandchild that left her weak and tired prior to this admission. Neurology work-up including MRI, EEG negative. MRI/MRA neg for acute infarct, 13 mm focus in left frontal lobe representing artifact vs small calcified meningioma, chronic small vessel ischemic changes, cerebral atrophy, high-grade stenosis in bilateral ICA, focal stenosis in left ACA and intracranial right vertebral artery. MRI of C-spine showing C4-5 cord compression from advanced disc degeneration with posterior endplate ridging.  Showing left foraminal impingement at the same level.  Also showing C5-6 and C6-7 ADCF with solid arthrodesis.  Neurologist at [facility] had recommended neurosurgical evaluation.   She developed fevers and transferred to ICU concerning for meningitis and started on Vanc, rocephin, ampicillin and acyclovir. LP was performed on 7/24 with WBC 55 RBC 2. Pending herpes, enterovirus, ehrlichia. CT A/P showed bilateral ground glass and lung opacities concerning for multifocal pneumonia.    PCCM consulted for decreased responsiveness and hypoxemia on NRB. On exam obtunded and only able to say name. O2 sats in the 70s on NRB. Transferred to the ICU for emergent intubation  7/24 Admitted to [facility] from [place]. Transferred to PCCM when found in respiratory failure 7/25 intubated, bronched, art line placed, PICC placed.  Neuro and ID consulted. Started decadron  7/26  decadron dc/d 7/27 felt most consistent with viral meningoencephalitis  7/29-7/30 sudden onset multiorgan failure; given tpa with improvement 8/4 trach 8/6 trach bleeding 8/12 off sedation awaiting any neurologic response. 8/15 transitioned off CRRT to iHD 8/16 tolerating trach collar 8/20 trach exchange to cuffless trach 8/22 Fever > work up  Assessment and Plan: 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 Preceding HFMD, CSF +enterovirus.  - Continue supportive care, has completed course of antiviral. Also received empiric bacterial meningitis coverage.  - Started modafinil for neurostimulation - Encephalopathy continues to be severe. Weaned from sedation since 8/5. Husband at bedside this AM shared that she would not want to live indefinitely like this. D/w Dr. Hortense Ramal who suggests watching an additional week for neurological recovery, then reassessing with [place] discussions.  - Per infection prevention, contact and droplet precautions are required for this infection.  Focal convulsive status epilepticus: Resolved. Precipitated by viral encephalitis, +/- meningioma.  - Continue keppra and vimpat per neurology  Critical cervical myopathy s/p ACDF C5-C7:  - Completed steroids.    Acute renal failure: Initiated CRRT 7/30 > 8/15 in setting of shock. First iHD 8/16. Has not required continue HD, UOP picking up, SCr clearing. Nephrology signed off 8/22.  - Remove CVC in setting of fever and no longer requiring HD per nephrology.  - Avoid nephrotoxins, support hemodynamically, monitor metabolic profile  Hypokalemia: Due to lasix challenge. - Supplement again today and monitor daily   Acute hypoxemic/hypercapnic respiratory failure due to multilobar pneumonia: s/p tracheostomy. Completed meropenem. - Continue trach care per PCCM, exchanged 8/21.   Small lymphocytic B cell non-hodgkin lymphoma Hypogammaglobinemia: Patient on rituximab (last dose June 2023)  contributing to immunosuppressed status.  -  Dr. Marin Olp aware and following periodically.  - s/p IVIG empirically with little improvement.    T2DM: HbA1c 8%.  - Continue CBGs and insulin coverage (10u + resistant SSI) q4h    Sudden onset shock, acute renal failure, acute liver failure, RV dilation presumed massive PE (7/29-7/30): s/p tpa with good improvement in hemodynamics; now off pressors - Continue coumadin.  - Midodrine to support BP through HD   Dysphagia: s/p PEG - Continue TFs, no aspiration grossly noted.  Fever: No clear nidus for infection. WBC remains normal at 4.4k. At risk of central fever. Also tachypneic and tachycardic which is stable. BP remains normalized. - Give tylenol prn - Check CXR - Exchange foley (was placed 7/29) and send UA/Cx from new indwelling foley. - Abd benign, consider further evaluation if fever persists and no nidus found.  - Check blood, tracheal aspirate, and urine cultures  Subjective: Husband at bedside reporting no real change. Had a fever this morning. Having good UOP, no swelling, no change in respiratory status or stool output.   Objective: Vitals:   06/21/22 0500 06/21/22 0600 06/21/22 0727 06/21/22 0800  BP: (!) 121/52 (!) 135/59 (!) 134/57 (!) 143/64  Pulse: (!) 106 (!) 107 (!) 108 (!) 117  Resp: (!) 35 (!) 48 (!) 42 (!) 42  Temp:    (!) 101 F (38.3 C)  TempSrc:    Axillary  SpO2: 98% 97% 97% 97%  Weight:      Height:       Gen: 66 y.o. female in no distress Pulm: Nonlabored tachypnea thru trach, no crackles noted. CV: Regular tachycardia, stable without murmur, rub, or gallop. No JVD, no dependent edema. GI: Abdomen soft, no grimace on deep palpation throughout with attention to RUQ. PEG site c/d/I without educate or discharge or significant erythema. +BS, soft/liquid brown stool in FCS bag.  Ext: Warm, no deformities. 2+ DP pulses. Skin: No rashes, lesions or ulcers on visualized skin. Left IV c/d/I, right IJ cath c/d/I,  diaphoretic.  Neuro: Opens eyes to voice, doesn't seem to track or follow commands, +blink to threat. No movement in extremities.  Data Personally reviewed: CBC: Recent Labs  Lab 06/17/22 0450 06/18/22 0438 06/19/22 0447 06/20/22 0610 06/21/22 0319  WBC 10.2 8.1 6.1 4.7 4.4  HGB 7.6* 8.2* 8.1* 7.7* 8.0*  HCT 24.6* 26.1* 26.8* 25.4* 26.2*  MCV 86.9 84.7 87.9 87.9 86.8  PLT 109* 130* 149* 190 546   Basic Metabolic Panel: Recent Labs  Lab 06/14/22 1555 06/15/22 0431 06/15/22 0946 06/16/22 0827 06/16/22 0828 06/16/22 1349 06/17/22 0450 06/18/22 0438 06/19/22 0447 06/20/22 0610 06/21/22 0319  NA 138 134* 137  --  135   < > 136 136 137 140 144  K 4.8 6.0* 5.6*  --  3.3*   < > 4.1 3.6 3.5 3.2* 3.4*  CL 103 102 102  --  96*   < > 96* 94* 96* 98 98  CO2 '28 25 24  '$ --  28   < > '26 27 30 31 31  '$ GLUCOSE 179* 358* 300*  --  81   < > 228* 192* 236* 209* 181*  BUN 26* 60* 73*  --  51*   < > 83* 109* 66* 86* 93*  CREATININE 0.44 1.10* 1.34*  --  1.02*   < > 1.47* 1.49* 0.99 1.06* 1.07*  CALCIUM 8.6* 8.7* 8.8*  --  8.5*   < > 8.1* 8.6* 8.4* 8.0* 8.4*  MG  --  2.6*  --  1.8  --   --   --   --   --   --   --   PHOS 1.9* 3.9 3.5  --  3.3  --   --   --   --   --   --    < > = values in this interval not displayed.   GFR: Estimated Creatinine Clearance: 47.8 mL/min (A) (by C-G formula based on SCr of 1.07 mg/dL (H)). Liver Function Tests: Recent Labs  Lab 06/14/22 1555 06/15/22 0431 06/15/22 0946 06/16/22 0828 06/17/22 0850  AST  --   --   --   --  32  ALT  --   --   --   --  54*  ALKPHOS  --   --   --   --  111  BILITOT  --   --   --   --  0.6  PROT  --   --   --   --  5.1*  ALBUMIN 2.6* 2.5* 2.4* 2.6* 2.2*   No results for input(s): "LIPASE", "AMYLASE" in the last 168 hours. Recent Labs  Lab 06/17/22 0850  AMMONIA 37*   Coagulation Profile: Recent Labs  Lab 06/17/22 0450 06/18/22 0438 06/19/22 0447 06/20/22 0610 06/21/22 0319  INR 2.2* 2.4* 2.1* 1.8* 1.7*    Family Communication: Husband at bedside  Disposition: Status is: Inpatient Remains inpatient appropriate because: Severity of illness Planned Discharge Destination:  TBD based on neurological recovery and goals of care discussions.   Patrecia Pour, MD 06/21/2022 11:17 AM Page by [email or web address]

## 2022-06-21 NOTE — Progress Notes (Signed)
[facility] Progress Note   Assessment/ Plan:   Resolved AKI b/l creat 0.8 on admission. Prolonged dialysis dependent; started CRRT 7/30 then HD 06/15/22. Pt admitted for viral meningoencephalitis complicated by critical cervical myopathy and now resp failure w/ multifocal PNA. Pt has lymphoma receiving active chemoRx (rituximab) w/ last dose in June 2023.  Has recovered GFR, stable SCR and good UOP Can remove Temp R IJ HD Catheter NO further recommendations, will not req outpt neph eval at this time Will sign off at this time, call for any questions or concerns Shock - stable, no pressors H/o lymphoma - getting active chemoRx w/ last dose rituxan in June 2023 AHRF - s/p trach, on TC now AMS - in setting of viral meningoencephalitis. Prolonged encephalopathy; Neurology and ID following.   Cervical myelopathy  DM2 - on insulin Disposition in ICU,  Subjective:    2.2L UOP yesterday SCr 1.07, BUN 93 on enteral nutrition, K 3.4   Objective:   BP (!) 143/64 (BP Location: Right Arm)   Pulse (!) 117   Temp (!) 101 F (38.3 C) (Axillary)   Resp (!) 42   Ht 5' 5.98" (1.676 m)   Wt 68.6 kg   SpO2 97%   BMI 24.42 kg/m   Intake/Output Summary (Last 24 hours) at 06/21/2022 0850 Last data filed at 06/21/2022 0600 Gross per 24 hour  Intake 2025.43 ml  Output 2180 ml  Net -154.57 ml    Weight change:   Physical Exam: Gen: NAD< not awake/interactive CVS: RRR Resp: clear Abd: soft Ext: no LE edema ACCESS: IJ HD cath Temp  Imaging: No results found.  Labs: BMET Recent Labs  Lab 06/14/22 1555 06/15/22 0431 06/15/22 0946 06/16/22 0828 06/16/22 1349 06/17/22 0450 06/18/22 0438 06/19/22 0447 06/20/22 0610 06/21/22 0319  NA 138 134* 137 135 134* 136 136 137 140 144  K 4.8 6.0* 5.6* 3.3* 3.1* 4.1 3.6 3.5 3.2* 3.4*  CL 103 102 102 96* 96* 96* 94* 96* 98 98  CO'2 28 25 24 28 26 26 27 30 31 '$ 31  GLUCOSE 179* 358* 300* 81 126* 228* 192* 236* 209* 181*  BUN 26*  60* 73* 51* 59* 83* 109* 66* 86* 93*  CREATININE 0.44 1.10* 1.34* 1.02* 1.20* 1.47* 1.49* 0.99 1.06* 1.07*  CALCIUM 8.6* 8.7* 8.8* 8.5* 8.1* 8.1* 8.6* 8.4* 8.0* 8.4*  PHOS 1.9* 3.9 3.5 3.3  --   --   --   --   --   --     CBC Recent Labs  Lab 06/18/22 0438 06/19/22 0447 06/20/22 0610 06/21/22 0319  WBC 8.1 6.1 4.7 4.4  HGB 8.2* 8.1* 7.7* 8.0*  HCT 26.1* 26.8* 25.4* 26.2*  MCV 84.7 87.9 87.9 86.8  PLT 130* 149* 190 244     Medications:     Chlorhexidine Gluconate Cloth  6 each Topical Daily   docusate  100 mg Per Tube BID   fiber supplement (BANATROL TF)  60 mL Per Tube BID   furosemide  80 mg Intravenous Daily   insulin aspart  0-20 Units Subcutaneous Q4H   insulin aspart  10 Units Subcutaneous Q4H   insulin detemir  22 Units Subcutaneous BID   midodrine  10 mg Per Tube TID   modafinil  100 mg Per Tube Daily   multivitamin  1 tablet Per Tube QHS   mouth rinse  15 mL Mouth Rinse Q2H   pantoprazole  40 mg Per Tube Daily   polyethylene glycol  17  g Per Tube Daily   sodium chloride flush  10-40 mL Intracatheter Q12H   Warfarin - Pharmacist Dosing Inpatient   Does not apply q1600   Rexene Agent, MD  06/21/2022, 8:50 AM

## 2022-06-21 NOTE — Progress Notes (Signed)
RN returned unused lacosamide to main pharmacy  Order [phone]  Wasted in stericycle  Witnessed by Sherlon Handing

## 2022-06-21 NOTE — Progress Notes (Signed)
Inpatient Diabetes Program Recommendations  AACE/ADA: New Consensus Statement on Inpatient Glycemic Control (2015)  Target Ranges:  Prepandial:   less than 140 mg/dL      Peak postprandial:   less than 180 mg/dL (1-2 hours)      Critically ill patients:  140 - 180 mg/dL   Lab Results  Component Value Date   GLUCAP 181 (H) 06/21/2022   HGBA1C 8.0 (H) 05/24/2022    Review of Glycemic Control  Latest Reference Range & Units 06/20/22 12:03 06/20/22 16:23 06/20/22 20:44 06/21/22 00:15 06/21/22 03:22 06/21/22 08:38  Glucose-Capillary 70 - 99 mg/dL 192 (H) 206 (H) 177 (H) 213 (H) 172 (H) 181 (H)  Diabetes history: DM 2 Outpatient Diabetes medications:  U500 insulin 80 units with breakfast, 50 units with lunch, and and 90 units with supper Current orders for Inpatient glycemic control:  Vital 65 ml/hr Levemir 22 units bid Novolog 0-20 units q 4 hours Novolog 10 units q 4 hours   Inpatient Diabetes Program Recommendations:    Consider increasing Levemir to 25 units bid and increase Novolog to 12 units q 4 hours.    Thanks,  Adah Perl, RN, BC-ADM Inpatient Diabetes Coordinator Pager [phone]  (8a-5p)

## 2022-06-22 ENCOUNTER — Inpatient Hospital Stay (HOSPITAL_COMMUNITY): Payer: Medicare Other

## 2022-06-22 DIAGNOSIS — J189 Pneumonia, unspecified organism: Secondary | ICD-10-CM | POA: Diagnosis not present

## 2022-06-22 DIAGNOSIS — J9601 Acute respiratory failure with hypoxia: Secondary | ICD-10-CM | POA: Diagnosis not present

## 2022-06-22 DIAGNOSIS — G9341 Metabolic encephalopathy: Secondary | ICD-10-CM | POA: Diagnosis not present

## 2022-06-22 DIAGNOSIS — A419 Sepsis, unspecified organism: Secondary | ICD-10-CM | POA: Diagnosis not present

## 2022-06-22 LAB — BASIC METABOLIC PANEL
Anion gap: 11 (ref 5–15)
BUN: 103 mg/dL — ABNORMAL HIGH (ref 8–23)
CO2: 35 mmol/L — ABNORMAL HIGH (ref 22–32)
Calcium: 8.6 mg/dL — ABNORMAL LOW (ref 8.9–10.3)
Chloride: 102 mmol/L (ref 98–111)
Creatinine, Ser: 1.09 mg/dL — ABNORMAL HIGH (ref 0.44–1.00)
GFR, Estimated: 56 mL/min — ABNORMAL LOW (ref >60)
Glucose, Bld: 209 mg/dL — ABNORMAL HIGH (ref 70–99)
Potassium: 3 mmol/L — ABNORMAL LOW (ref 3.5–5.1)
Sodium: 148 mmol/L — ABNORMAL HIGH (ref 135–145)

## 2022-06-22 LAB — MAGNESIUM: Magnesium: 1.7 mg/dL (ref 1.7–2.4)

## 2022-06-22 LAB — GLUCOSE, CAPILLARY
Glucose-Capillary: 145 mg/dL — ABNORMAL HIGH (ref 70–99)
Glucose-Capillary: 176 mg/dL — ABNORMAL HIGH (ref 70–99)
Glucose-Capillary: 179 mg/dL — ABNORMAL HIGH (ref 70–99)
Glucose-Capillary: 157 mg/dL — ABNORMAL HIGH (ref 70–99)
Glucose-Capillary: 162 mg/dL — ABNORMAL HIGH (ref 70–99)
Glucose-Capillary: 173 mg/dL — ABNORMAL HIGH (ref 70–99)
Glucose-Capillary: 199 mg/dL — ABNORMAL HIGH (ref 70–99)
Glucose-Capillary: 214 mg/dL — ABNORMAL HIGH (ref 70–99)

## 2022-06-22 LAB — CBC
HCT: 27.8 % — ABNORMAL LOW (ref 36.0–46.0)
Hemoglobin: 8.3 g/dL — ABNORMAL LOW (ref 12.0–15.0)
MCH: 26.6 pg (ref 26.0–34.0)
MCHC: 29.9 g/dL — ABNORMAL LOW (ref 30.0–36.0)
MCV: 89.1 fL (ref 80.0–100.0)
Platelets: 306 10*3/uL (ref 150–400)
RBC: 3.12 MIL/uL — ABNORMAL LOW (ref 3.87–5.11)
RDW: 20.9 % — ABNORMAL HIGH (ref 11.5–15.5)
WBC: 5.5 10*3/uL (ref 4.0–10.5)
nRBC: 0 % (ref 0.0–0.2)

## 2022-06-22 LAB — PROTIME-INR
INR: 1.7 — ABNORMAL HIGH (ref 0.8–1.2)
Prothrombin Time: 20.1 seconds — ABNORMAL HIGH (ref 11.4–15.2)

## 2022-06-22 LAB — URINE CULTURE: Culture: NO GROWTH

## 2022-06-22 MED ORDER — MAGNESIUM OXIDE -MG SUPPLEMENT 400 (240 MG) MG PO TABS
400.0000 mg | ORAL_TABLET | Freq: Two times a day (BID) | ORAL | Status: AC
  Administered 2022-06-22 (×2): 400 mg via ORAL
  Filled 2022-06-22 (×2): qty 1

## 2022-06-22 MED ORDER — LEVETIRACETAM IN NACL 1000 MG/100ML IV SOLN
1000.0000 mg | Freq: Two times a day (BID) | INTRAVENOUS | Status: DC
  Administered 2022-06-22 – 2022-06-24 (×5): 1000 mg via INTRAVENOUS
  Filled 2022-06-22 (×6): qty 100

## 2022-06-22 MED ORDER — POTASSIUM CHLORIDE 20 MEQ PO PACK: 60.0000 meq | PACK | Freq: Once | ORAL | Status: DC

## 2022-06-22 MED ORDER — WARFARIN SODIUM 3 MG PO TABS
6.0000 mg | ORAL_TABLET | Freq: Once | ORAL | Status: AC
  Administered 2022-06-22: 6 mg via ORAL
  Filled 2022-06-22: qty 2

## 2022-06-22 MED ORDER — FREE WATER
200.0000 mL | Freq: Four times a day (QID) | Status: DC
  Administered 2022-06-22 – 2022-06-24 (×8): 200 mL

## 2022-06-22 MED ORDER — MIDODRINE HCL 5 MG PO TABS
2.5000 mg | ORAL_TABLET | Freq: Two times a day (BID) | ORAL | Status: DC
Start: 2022-06-22 — End: 2022-06-25
  Administered 2022-06-22 – 2022-06-24 (×5): 2.5 mg
  Filled 2022-06-22 (×4): qty 1

## 2022-06-22 MED ORDER — POTASSIUM CHLORIDE 20 MEQ PO PACK
40.0000 meq | PACK | Freq: Once | ORAL | Status: AC
  Administered 2022-06-22: 40 meq
  Filled 2022-06-22: qty 2

## 2022-06-22 MED ORDER — POTASSIUM CHLORIDE 20 MEQ PO PACK
60.0000 meq | PACK | Freq: Two times a day (BID) | ORAL | Status: DC
Start: 2022-06-22 — End: 2022-06-22
  Administered 2022-06-22: 60 meq
  Filled 2022-06-22: qty 3

## 2022-06-22 MED ORDER — INSULIN DETEMIR 100 UNIT/ML ~~LOC~~ SOLN
25.0000 [IU] | Freq: Two times a day (BID) | SUBCUTANEOUS | Status: DC
  Administered 2022-06-22 – 2022-06-24 (×6): 25 [IU] via SUBCUTANEOUS
  Filled 2022-06-22 (×8): qty 0.25

## 2022-06-22 MED ORDER — DOCUSATE SODIUM 50 MG/5ML PO LIQD: 100.0000 mg | Freq: Two times a day (BID) | ORAL | Status: DC | PRN

## 2022-06-22 MED ORDER — PSYLLIUM 95 % PO PACK
1.0000 | PACK | Freq: Every day | ORAL | Status: DC
  Filled 2022-06-22: qty 1

## 2022-06-22 NOTE — Progress Notes (Signed)
[place] for heparin > warfarin  Indication: pulmonary embolus  Allergies  Allergen Reactions   Canagliflozin Other (See Comments)    recurrent vaginitis   Glucophage [Metformin] Other (See Comments)    GI intolerance   Niaspan [Niacin] Other (See Comments)    flushing    Patient Measurements: Height: 5' 5.98" (167.6 cm) Weight: 68.6 kg (151 lb 3.8 oz) IBW/kg (Calculated) : 59.25 Heparin Dosing Weight: 79kg  Vital Signs: Temp: 98.3 F (36.8 C) (08/23 0800) Temp Source: Axillary (08/23 0800) BP: 143/61 (08/23 0800) Pulse Rate: 101 (08/23 0700)  Labs: Recent Labs    06/20/22 0610 06/21/22 0319 06/22/22 0330  HGB 7.7* 8.0* 8.3*  HCT 25.4* 26.2* 27.8*  PLT 190 244 306  LABPROT 20.5* 19.8* 20.1*  INR 1.8* 1.7* 1.7*  CREATININE 1.06* 1.07* 1.09*     Estimated Creatinine Clearance: 46.9 mL/min (A) (by C-G formula based on SCr of 1.09 mg/dL (H)).   Assessment: 81 yoF admitted with meningitis from OSH. Pt with ongoing fevers and worsening clinical status 7/29, RV severely down on beside ECHO so concerns for acute PE. Pt received alteplase on 7/30 for suspected saddle PE.    Heparin stopped 8/14 with therapeutic INR. INR today is remains slightly subtherapeutic at 1.7. Hgb has remained stable at 8s, plts WNL. No s/sx of bleeding. Pt has been transitioned from CRRT to [facility] - last session 8/19, currently no plans for iHD and just allow renal to recover. TF at goal on Vital AF.   Goal of Therapy:  INR 2-3 Monitor platelets by anticoagulation protocol: Yes   Plan:  Warfarin 6 mg tonight - will hold on considering heparin gtt unless INR continues to remain subtherapeutic    Daily heparin level, INR, and CBC  Monitor for s/sx bleeding   Antonietta Jewel, PharmD, BCCCP Clinical Pharmacist  Phone: [phone] 06/22/2022 8:23 AM  Please check AMION for all [place] phone numbers After 10:00 PM, call [place] [phone]

## 2022-06-22 NOTE — Care Plan (Signed)
LTM reviewed till 1630. Polyspikes were also noted in left posterior quadrant which at times appeared quasiperiodic at 1 Hz.   Kahla Risdon [name]

## 2022-06-22 NOTE — Progress Notes (Signed)
LTM hook up. [facility] notified. Patient hooked up with tape.

## 2022-06-22 NOTE — Progress Notes (Addendum)
[facility] PROGRESS NOTE    Progress Note  LANDA MULLINAX  ZOX:[id number] DOB: Oct 17, 1956 DOA: 05/07/2022 PCP: Ginger Organ., MD     Brief Narrative:   Valerie Cline is an 66 y.o. female with non-Hodgkin's lymphoma on Rituxan (last dose in June 2023), essential hypertension, diabetes mellitus type 2, carotid stenosis, recent head foot and mouth disease transferred from [facility] for left upper extremity tremors at the facility neurological work-up was negative  EEG notes seizures, MRI of the brain showed a 13 mm left frontal artifact versus calcified meningioma, chronic small vessel disease cerebral atrophy and high-grade stenosis bilaterally in the ICAs, focal stenosis of the left ACA intracranial right vertebral artery, MRI of the C-spine shows C4-C5 compression from advanced disc disease.  She started developing fever and transferred to the ICU there was a concern for meningitis started on empirically on Vanco Rocephin, ampicillin and acyclovir LP was performed on 05/23/2021 that showed 37 white blood cells 2 red blood cells, CT AP showed bilateral groundglass opacities concerning for multifocal pneumonia.  PCCM was consulted as there was decreased responsive had hypoxia on a nonrebreather, basically obtunded    Significant Events: 7/24 Admitted to [facility] from [place]. Transferred to PCCM when found in respiratory failure 7/25 intubated, bronched, art line placed, PICC placed.  Neuro and ID consulted. Started decadron  7/26 decadron dc/d 7/27 felt most consistent with viral meningoencephalitis  7/29-7/30 sudden onset multiorgan failure; given tpa with improvement 8/4 trach 8/6 trach bleeding 8/12 off sedation awaiting any neurologic response. 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 Completed course of antiviral therapy and bacterial meningitis antibiotic. Neurology was consulted was started on modafinil for neuro stimulation. Apparently encephalopathy continues to be severe. She is wean off sedation 06/04/2022. Dr. Hortense Ramal is on board and suggested watching her an additional week for neurological recovery then reassess in goals of care. She also recommended to switch Keppra to twice a day along with Vimpat twice a day.  Focal convulsive status epilepticus: Now resolved, in the setting of viral encephalitis continue Keppra and Vimpat.  Cervical myelopathy status post ACDF C5 C7: Has completed steroids.  Acute kidney injury: With a baseline creatinine of around 0.8 started on CRRT on 05/29/2022, then HD on 06/15/2022. Has had good urine output, renal on board. Recommended to remove temporary catheter for dialysis. Patient will not require outpatient HD at this time. Nephrology will sign off.  Hypokalemia: Likely due to Lasix challenge, persistently low continue oral repletion. Recheck in the morning.  Acute respiratory failure with hypoxia and hypercapnia due to multifocal pneumonia: Status post tracheostomy. Completed course of meropenem. Continue trach care per PCCM, has been exchanged on 06/20/2022.  Acute pulmonary emboli: CT angio of the chest on 06/04/2022 showed possible pulmonary emboli. Currently on Coumadin INR sub-therapeutic. Continue Coumadin per pharmacy.  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 Status post IVIG empirically with little improvement. Dr. Marin Olp is aware.  Diabetes mellitus type 2: With an A1c of 8:  Hypovolemic hypernatremic : Negative  about 8 L with strong urine output we will  start free water per tube.  Sudden onset of shock: Now off pressors. Blood pressure has remained relatively stable slowly trending up. Currently on midodrine we will start weaning off. This is probably contributing to her tachycardia.  Dysphagia: Status post PEG tube, currently in tube feeding start free water per tube.  Fever of unclear source: Tmax of 101 over 48 hours ago, she was started on Tylenol, has no leukocytosis. Urine cultures and blood cultures were sent on 06/21/2022. Chest x-ray showed improved variation with decreased bilateral edema small right pleural effusion.  Stage II sacral decubitus ulcer RN Pressure Injury Documentation: Pressure Injury 06/01/22 Sacrum Mid;Lower Stage 2 -  Partial thickness loss of dermis presenting as a shallow open injury with a red, pink wound bed without slough. pink wound bed size of penny with red broken skin in middle (Active)  06/01/22 1200  Location: Sacrum  Location Orientation: Mid;Lower  Staging: Stage 2 -  Partial thickness loss of dermis presenting as a shallow open injury with a red, pink wound bed without slough.  Wound Description (Comments): pink wound bed size of penny with red broken skin in middle  Present on Admission:   Dressing Type Foam - Lift dressing to assess site every shift 06/22/22 0800    Estimated body mass index is 24.42 kg/m as calculated from the following:   Height as of this encounter: 5' 5.98" (1.676 m).   Weight as of this encounter: 68.6 kg.   DVT prophylaxis: lovenox Family Communication:Daughter Status is: Inpatient Remains inpatient appropriate because: Severe neurological damage      Code Status:     Code Status Orders  (From admission, onward)           Start     Ordered   05/24/22 0007  Full code  Continuous        05/24/22 0010           Code Status History     Date Active Date Inactive Code Status Order ID Comments User  Context   12/08/2021 1444 12/09/2021 1509 Full Code [id number]  Eustace Moore, MD Inpatient   11/08/2019 2028 11/11/2019 1652 Full Code [id number]  Gwynne Edinger, MD ED   11/08/2019 2027 11/08/2019 2028 Full Code [id number]  Wouk, Ailene Rud, MD ED         IV Access:   Peripheral IV   Procedures and diagnostic studies:   DG CHEST PORT 1 VIEW  Result Date: 06/21/2022 CLINICAL DATA:  Fever with respiratory failure EXAM: PORTABLE CHEST 1 VIEW COMPARISON:  Chest 06/17/2022 FINDINGS: Tracheostomy remains in good position. Left arm PICC tip in the SVC. Right jugular catheter removed Improved aeration lungs. Improvement in bibasilar atelectasis. Improvement in bilateral edema. Small right effusion unchanged. IMPRESSION: Improved aeration with decrease in bilateral edema and bibasilar atelectasis. Small right effusion. Electronically Signed   By: Franchot Gallo M.D.   On: 06/21/2022 13:50     Medical Consultants:   None.   Subjective:    CATRINIA RACICOT nonverbal  Objective:    Vitals:   06/22/22 0500 06/22/22 0600 06/22/22 0700 06/22/22 0800  BP: (!) 147/58 (!) 155/65 (!) 140/58 (!) 143/61  Pulse: (!) 104 (!) 106 (!) 101   Resp: (!) 49 (!) 28 (!) 37   Temp:    98.3 F (36.8 C)  TempSrc:    Axillary  SpO2: 96% 97% 97%   Weight:      Height:       SpO2: 97 %  O2 Flow Rate (L/min): 8 L/min FiO2 (%): 35 %   Intake/Output Summary (Last 24 hours) at 06/22/2022 1962 Last data filed at 06/22/2022 0800 Gross per 24 hour  Intake 2014.22 ml  Output 2510 ml  Net -495.78 ml   Filed Weights   06/17/22 0600 06/18/22 0600 06/19/22 0600  Weight: 70.5 kg 68.9 kg 68.6 kg    Exam: General exam: In no acute distress. Respiratory system: Good air movement and clear to auscultation. Cardiovascular system: S1 & S2 heard, RRR. No JVD. Gastrointestinal system: Abdomen is nondistended, soft and nontender.  Central nervous system: Only grimaces to pain Extremities: No pedal edema. Skin: No  rashes, lesions or ulcers   Data Reviewed:    Labs: Basic Metabolic Panel: Recent Labs  Lab 06/15/22 0946 06/16/22 0827 06/16/22 0828 06/16/22 1349 06/18/22 0438 06/19/22 0447 06/20/22 0610 06/21/22 0319 06/22/22 0330  NA 137  --  135   < > 136 137 140 144 148*  K 5.6*  --  3.3*   < > 3.6 3.5 3.2* 3.4* 3.0*  CL 102  --  96*   < > 94* 96* 98 98 102  CO2 24  --  28   < > '27 30 31 31 '$ 35*  GLUCOSE 300*  --  81   < > 192* 236* 209* 181* 209*  BUN 73*  --  51*   < > 109* 66* 86* 93* 103*  CREATININE 1.34*  --  1.02*   < > 1.49* 0.99 1.06* 1.07* 1.09*  CALCIUM 8.8*  --  8.5*   < > 8.6* 8.4* 8.0* 8.4* 8.6*  MG  --  1.8  --   --   --   --   --   --   --   PHOS 3.5  --  3.3  --   --   --   --   --   --    < > = values in this interval not displayed.   GFR Estimated Creatinine Clearance: 46.9 mL/min (A) (by C-G formula based on SCr of 1.09 mg/dL (H)). Liver Function Tests: Recent Labs  Lab 06/15/22 0946 06/16/22 0828 06/17/22 0850  AST  --   --  32  ALT  --   --  54*  ALKPHOS  --   --  111  BILITOT  --   --  0.6  PROT  --   --  5.1*  ALBUMIN 2.4* 2.6* 2.2*   No results for input(s): "LIPASE", "AMYLASE" in the last 168 hours. Recent Labs  Lab 06/17/22 0850  AMMONIA 37*   Coagulation profile Recent Labs  Lab 06/18/22 0438 06/19/22 0447 06/20/22 0610 06/21/22 0319 06/22/22 0330  INR 2.4* 2.1* 1.8* 1.7* 1.7*   COVID-19 Labs  No results for input(s): "DDIMER", "FERRITIN", "LDH", "CRP" in the last 72 hours.  Lab Results  Component Value Date   SARSCOV2NAA POSITIVE (A) 11/16/2021    CBC: Recent Labs  Lab 06/18/22 0438 06/19/22 0447 06/20/22 0610 06/21/22 0319 06/22/22 0330  WBC 8.1 6.1 4.7 4.4 5.5  HGB 8.2* 8.1* 7.7* 8.0* 8.3*  HCT 26.1* 26.8* 25.4* 26.2* 27.8*  MCV 84.7 87.9 87.9 86.8 89.1  PLT 130* 149* 190 244 306   Cardiac Enzymes: No results for input(s): "CKTOTAL", "CKMB", "CKMBINDEX", "TROPONINI" in the last 168 hours. BNP (last 3  results) No results for input(s): "PROBNP" in the last 8760 hours. CBG: Recent Labs  Lab 06/21/22 1555 06/21/22 1919 06/21/22 2357 06/22/22 0332 06/22/22  0730  GLUCAP 184* 145* 176* 179* 157*   D-Dimer: No results for input(s): "DDIMER" in the last 72 hours. Hgb A1c: No results for input(s): "HGBA1C" in the last 72 hours. Lipid Profile: No results for input(s): "CHOL", "HDL", "LDLCALC", "TRIG", "CHOLHDL", "LDLDIRECT" in the last 72 hours. Thyroid function studies: No results for input(s): "TSH", "T4TOTAL", "T3FREE", "THYROIDAB" in the last 72 hours.  Invalid input(s): "FREET3" Anemia work up: No results for input(s): "VITAMINB12", "FOLATE", "FERRITIN", "TIBC", "IRON", "RETICCTPCT" in the last 72 hours. Sepsis Labs: Recent Labs  Lab 06/17/22 1245 06/18/22 0438 06/19/22 0447 06/20/22 0610 06/21/22 0319 06/22/22 0330  PROCALCITON 0.78  --   --   --   --   --   WBC  --    < > 6.1 4.7 4.4 5.5   < > = values in this interval not displayed.   Microbiology Recent Results (from the past 240 hour(s))  Culture, Respiratory w Gram Stain     Status: None (Preliminary result)   Collection Time: 06/21/22 11:54 AM   Specimen: Tracheal Aspirate; Respiratory  Result Value Ref Range Status   Specimen Description TRACHEAL ASPIRATE  Final   Special Requests NONE  Final   Gram Stain   Final    ABUNDANT SQUAMOUS EPITHELIAL CELLS PRESENT ABUNDANT WBC PRESENT, PREDOMINANTLY PMN FEW GRAM POSITIVE COCCI IN PAIRS RARE GRAM NEGATIVE RODS Performed at [facility], [place] [address], [place], [place] [address]    Culture PENDING  Incomplete   Report Status PENDING  Incomplete  Culture, blood (Routine X 2) w Reflex to ID Panel     Status: None (Preliminary result)   Collection Time: 06/21/22 12:31 PM   Specimen: BLOOD  Result Value Ref Range Status   Specimen Description BLOOD SITE NOT SPECIFIED  Final   Special Requests   Final    BOTTLES DRAWN AEROBIC AND ANAEROBIC Blood Culture  adequate volume   Culture   Final    NO GROWTH < 24 HOURS Performed at [facility], [place] [address], [place], [place] [address]    Report Status PENDING  Incomplete  Culture, blood (Routine X 2) w Reflex to ID Panel     Status: None (Preliminary result)   Collection Time: 06/21/22 12:34 PM   Specimen: BLOOD RIGHT HAND  Result Value Ref Range Status   Specimen Description BLOOD RIGHT HAND  Final   Special Requests AEROBIC BOTTLE ONLY Blood Culture adequate volume  Final   Culture   Final    NO GROWTH < 24 HOURS Performed at [facility], [place] [address] [place], [place] [address]    Report Status PENDING  Incomplete     Medications:    Chlorhexidine Gluconate Cloth  6 each Topical Daily   docusate  100 mg Per Tube BID   fiber supplement (BANATROL TF)  60 mL Per Tube BID   furosemide  80 mg Intravenous Daily   insulin aspart  0-20 Units Subcutaneous Q4H   insulin aspart  10 Units Subcutaneous Q4H   insulin detemir  22 Units Subcutaneous BID   midodrine  10 mg Per Tube TID   modafinil  100 mg Per Tube Daily   multivitamin  1 tablet Per Tube QHS   mouth rinse  15 mL Mouth Rinse Q2H   pantoprazole  40 mg Per Tube Daily   polyethylene glycol  17 g Per Tube Daily   sodium chloride flush  10-40 mL Intracatheter Q12H   Warfarin -  Pharmacist Dosing Inpatient   Does not apply q1600   Continuous Infusions:  sodium chloride 5 mL/hr at 06/22/22 0800   sodium chloride Stopped (06/16/22 2348)   albumin human 999 mL/hr at 06/16/22 1000   anticoagulant sodium citrate     feeding supplement (VITAL AF 1.2 CAL) 65 mL/hr at 06/22/22 0800   lacosamide (VIMPAT) IV Stopped (06/21/22 2221)   levETIRAcetam Stopped (06/21/22 2147)      LOS: 30 days   [place] [facility]  06/22/2022, 8:22 AM

## 2022-06-23 DIAGNOSIS — G40901 Epilepsy, unspecified, not intractable, with status epilepticus: Secondary | ICD-10-CM | POA: Diagnosis not present

## 2022-06-23 DIAGNOSIS — G039 Meningitis, unspecified: Secondary | ICD-10-CM | POA: Diagnosis not present

## 2022-06-23 DIAGNOSIS — J9601 Acute respiratory failure with hypoxia: Secondary | ICD-10-CM | POA: Diagnosis not present

## 2022-06-23 DIAGNOSIS — Z7189 Other specified counseling: Secondary | ICD-10-CM | POA: Diagnosis not present

## 2022-06-23 DIAGNOSIS — Z515 Encounter for palliative care: Secondary | ICD-10-CM | POA: Diagnosis not present

## 2022-06-23 DIAGNOSIS — R4182 Altered mental status, unspecified: Secondary | ICD-10-CM | POA: Diagnosis not present

## 2022-06-23 DIAGNOSIS — J189 Pneumonia, unspecified organism: Secondary | ICD-10-CM | POA: Diagnosis not present

## 2022-06-23 DIAGNOSIS — G9341 Metabolic encephalopathy: Secondary | ICD-10-CM | POA: Diagnosis not present

## 2022-06-23 LAB — GLUCOSE, CAPILLARY
Glucose-Capillary: 161 mg/dL — ABNORMAL HIGH (ref 70–99)
Glucose-Capillary: 146 mg/dL — ABNORMAL HIGH (ref 70–99)
Glucose-Capillary: 251 mg/dL — ABNORMAL HIGH (ref 70–99)
Glucose-Capillary: 200 mg/dL — ABNORMAL HIGH (ref 70–99)
Glucose-Capillary: 229 mg/dL — ABNORMAL HIGH (ref 70–99)
Glucose-Capillary: 199 mg/dL — ABNORMAL HIGH (ref 70–99)

## 2022-06-23 LAB — BASIC METABOLIC PANEL
Anion gap: 10 (ref 5–15)
Anion gap: 10 (ref 5–15)
BUN: 108 mg/dL — ABNORMAL HIGH (ref 8–23)
BUN: 108 mg/dL — ABNORMAL HIGH (ref 8–23)
CO2: 36 mmol/L — ABNORMAL HIGH (ref 22–32)
CO2: 35 mmol/L — ABNORMAL HIGH (ref 22–32)
Calcium: 8.8 mg/dL — ABNORMAL LOW (ref 8.9–10.3)
Calcium: 8.5 mg/dL — ABNORMAL LOW (ref 8.9–10.3)
Chloride: 107 mmol/L (ref 98–111)
Chloride: 101 mmol/L (ref 98–111)
Creatinine, Ser: 1.06 mg/dL — ABNORMAL HIGH (ref 0.44–1.00)
Creatinine, Ser: 0.98 mg/dL (ref 0.44–1.00)
GFR, Estimated: 58 mL/min — ABNORMAL LOW (ref >60)
GFR, Estimated: >60 mL/min (ref >60)
Glucose, Bld: 175 mg/dL — ABNORMAL HIGH (ref 70–99)
Glucose, Bld: 422 mg/dL — ABNORMAL HIGH (ref 70–99)
Potassium: 3.9 mmol/L (ref 3.5–5.1)
Potassium: 4.2 mmol/L (ref 3.5–5.1)
Sodium: 153 mmol/L — ABNORMAL HIGH (ref 135–145)
Sodium: 146 mmol/L — ABNORMAL HIGH (ref 135–145)

## 2022-06-23 LAB — CBC
HCT: 30.3 % — ABNORMAL LOW (ref 36.0–46.0)
Hemoglobin: 8.7 g/dL — ABNORMAL LOW (ref 12.0–15.0)
MCH: 26.1 pg (ref 26.0–34.0)
MCHC: 28.7 g/dL — ABNORMAL LOW (ref 30.0–36.0)
MCV: 91 fL (ref 80.0–100.0)
Platelets: 382 10*3/uL (ref 150–400)
RBC: 3.33 MIL/uL — ABNORMAL LOW (ref 3.87–5.11)
RDW: 21.2 % — ABNORMAL HIGH (ref 11.5–15.5)
WBC: 6.9 10*3/uL (ref 4.0–10.5)
nRBC: 0 % (ref 0.0–0.2)

## 2022-06-23 LAB — OSMOLALITY: Osmolality: 376 mOsm/kg (ref 275–295)

## 2022-06-23 LAB — PROTIME-INR
INR: 1.9 — ABNORMAL HIGH (ref 0.8–1.2)
Prothrombin Time: 21.3 seconds — ABNORMAL HIGH (ref 11.4–15.2)

## 2022-06-23 LAB — MAGNESIUM: Magnesium: 1.9 mg/dL (ref 1.7–2.4)

## 2022-06-23 LAB — OSMOLALITY, URINE: Osmolality, Ur: 377 mOsm/kg (ref 300–900)

## 2022-06-23 MED ORDER — DEXTROSE 5 % IV SOLN: INTRAVENOUS | Status: AC

## 2022-06-23 MED ORDER — WARFARIN SODIUM 5 MG PO TABS
5.0000 mg | ORAL_TABLET | Freq: Once | ORAL | Status: AC
  Administered 2022-06-23: 5 mg
  Filled 2022-06-23: qty 1

## 2022-06-23 MED ORDER — ORAL CARE MOUTH RINSE
15.0000 mL | OROMUCOSAL | Status: DC | PRN
Start: 2022-06-23 — End: 2022-06-26

## 2022-06-23 MED ORDER — POTASSIUM CHLORIDE 20 MEQ PO PACK
60.0000 meq | PACK | Freq: Once | ORAL | Status: AC
  Administered 2022-06-23: 60 meq
  Filled 2022-06-23: qty 3

## 2022-06-23 MED ORDER — MAGNESIUM SULFATE 2 GM/50ML IV SOLN
2.0000 g | Freq: Once | INTRAVENOUS | Status: AC
Start: 2022-06-23 — End: 2022-06-23
  Administered 2022-06-23: 2 g via INTRAVENOUS
  Filled 2022-06-23: qty 50

## 2022-06-23 MED ORDER — ORAL CARE MOUTH RINSE
15.0000 mL | OROMUCOSAL | Status: DC
  Administered 2022-06-23 – 2022-06-25 (×24): 15 mL via OROMUCOSAL

## 2022-06-23 NOTE — Progress Notes (Signed)
EEG D/C'd. Patient had mild break down mainly on Fp's. More than likely from previous hook up. [facility] notified.

## 2022-06-23 NOTE — Progress Notes (Signed)
ANTICOAGULATION CONSULT NOTE  Pharmacy Consult for heparin > warfarin  Indication: pulmonary embolus  Allergies  Allergen Reactions   Canagliflozin Other (See Comments)    recurrent vaginitis   Glucophage [Metformin] Other (See Comments)    GI intolerance   Niaspan [Niacin] Other (See Comments)    flushing    Patient Measurements: Height: 5' 5.98" (167.6 cm) Weight: 68.4 kg (150 lb 12.7 oz) IBW/kg (Calculated) : 59.25 Heparin Dosing Weight: 79kg  Vital Signs: Temp: 98.3 F (36.8 C) (08/24 0400) Temp Source: Axillary (08/24 0400) BP: 121/63 (08/24 0736) Pulse Rate: 111 (08/24 0736)  Labs: Recent Labs    06/21/22 0319 06/22/22 0330 06/23/22 0338  HGB 8.0* 8.3* 8.7*  HCT 26.2* 27.8* 30.3*  PLT 244 306 382  LABPROT 19.8* 20.1* 21.3*  INR 1.7* 1.7* 1.9*  CREATININE 1.07* 1.09* 1.06*     Estimated Creatinine Clearance: 48.2 mL/min (A) (by C-G formula based on SCr of 1.06 mg/dL (H)).   Assessment: 23 yoF admitted with meningitis from OSH. Pt with ongoing fevers and worsening clinical status 7/29, RV severely down on beside ECHO so concerns for acute PE. Pt received alteplase on 7/30 for suspected saddle PE.    Heparin stopped 8/14 with therapeutic INR. INR today is close to goal at 1.9. Hgb 8.7, plts WNL. No s/sx of bleeding. Pt has been transitioned from CRRT to [facility] - last session 8/19, currently no plans for iHD and just allow renal to recover. TF at goal on Vital AF.   Goal of Therapy:  INR 2-3 Monitor platelets by anticoagulation protocol: Yes   Plan:  Warfarin 5 mg tonight    Daily heparin level, INR, and CBC  Monitor for s/sx bleeding   Antonietta Jewel, PharmD, BCCCP Clinical Pharmacist  Phone: [phone] 06/23/2022 8:15 AM  Please check AMION for all [place] phone numbers After 10:00 PM, call [place] [phone]

## 2022-06-23 NOTE — Progress Notes (Signed)
Social visit. Spoke with daughter and understand we are transitioning to comfort next week Appreciate Dr. Lavetta Nielsen help. PCCM will be available PRN  Erskine Emery MD PCCM

## 2022-06-23 NOTE — Progress Notes (Signed)
[facility] PROGRESS NOTE    Progress Note  Valerie Cline  KDT:[id number] DOB: Aug 10, 1956 DOA: 05/03/2022 PCP: Ginger Organ., MD     Brief Narrative:   Valerie Cline is an 66 y.o. female with non-Hodgkin's lymphoma on Rituxan (last dose in June 2023), essential hypertension, diabetes mellitus type 2, carotid stenosis, recent head foot and mouth disease transferred from [facility] for left upper extremity tremors at the facility neurological work-up was negative  EEG notes seizures, MRI of the brain showed a 13 mm left frontal artifact versus calcified meningioma, chronic small vessel disease cerebral atrophy and high-grade stenosis bilaterally in the ICAs, focal stenosis of the left ACA intracranial right vertebral artery, MRI of the C-spine shows C4-C5 compression from advanced disc disease.  She started developing fever and transferred to the ICU there was a concern for meningitis started on empirically on Vanco Rocephin, ampicillin and acyclovir LP was performed on 05/23/2021 that showed 72 white blood cells 2 red blood cells, CT AP showed bilateral groundglass opacities concerning for multifocal pneumonia.  PCCM was consulted as there was decreased responsive had hypoxia on a nonrebreather, basically obtunded    Significant Events: 7/24 Admitted to [facility] from [place]. Transferred to PCCM when found in respiratory failure 7/25 intubated, bronched, art line placed, PICC placed.  Neuro and ID consulted. Started decadron  7/26 decadron dc/d 7/27 felt most consistent with viral meningoencephalitis  7/29-7/30 sudden onset multiorgan failure; given tpa with improvement 8/4 trach 8/6 trach bleeding 8/12 off sedation awaiting any neurologic response. 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 Completed course of antiviral therapy and bacterial meningitis antibiotic. Neurology was consulted was started on modafinil for neuro stimulation. Apparently encephalopathy continues to be severe. She is wean off sedation 06/04/2022. Dr. Hortense Ramal is on board and suggested watching her an additional week for neurological recovery then reassess in goals of care. She also recommended to switch Keppra to twice a day along with Vimpat twice a day. Family agreed to meet with palliative care with  Focal convulsive status epilepticus: Now resolved, in the setting of viral encephalitis continue Keppra and Vimpat.  Cervical myelopathy status post ACDF C5 C7: Has completed steroids.  Acute kidney injury: With a baseline creatinine of around 0.8 started on CRRT on 05/29/2022, then HD on 06/15/2022. Has had good urine output, renal on board. Recommended to remove temporary catheter for dialysis. Patient will not require outpatient HD at this time. Nephrology will sign off.  Hypokalemia: Likely due to Lasix challenge, repleted orally, now improved.  Acute respiratory failure with hypoxia and hypercapnia due to multifocal pneumonia: Status post tracheostomy. Completed course of meropenem. Continue trach care per PCCM, has been exchanged on 06/20/2022.  Acute pulmonary emboli: CT angio of the chest on 06/04/2022 showed possible pulmonary emboli. Currently on Coumadin INR sub-therapeutic. Continue Coumadin per pharmacy.  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 Status post IVIG empirically with little improvement. Dr. Marin Olp is aware.  Diabetes mellitus type 2: With an A1c of 8:  Hypovolemic hypernatremic  : Negative about 8 L with strong urine  output we will start free water per tube. Concern about SIADH check a urine osmolarity and serum osmolarity. Probably due to meningitis.  Sudden onset of shock: Now off pressors. Blood pressure has remained relatively stable slowly trending up. Currently on midodrine we will start weaning off. This is probably contributing to her tachycardia.  Dysphagia: Status post PEG tube, currently in tube feeding start free water per tube.  Fever of unclear source: Tmax of 101 over 48 hours ago, she was started on Tylenol, has no leukocytosis. Urine cultures and blood cultures were sent on 06/21/2022. Chest x-ray showed improved variation with decreased bilateral edema small right pleural effusion.  Stage II sacral decubitus ulcer RN Pressure Injury Documentation: Pressure Injury 06/01/22 Sacrum Mid;Lower Stage 2 -  Partial thickness loss of dermis presenting as a shallow open injury with a red, pink wound bed without slough. pink wound bed size of penny with red broken skin in middle (Active)  06/01/22 1200  Location: Sacrum  Location Orientation: Mid;Lower  Staging: Stage 2 -  Partial thickness loss of dermis presenting as a shallow open injury with a red, pink wound bed without slough.  Wound Description (Comments): pink wound bed size of penny with red broken skin in middle  Present on Admission:   Dressing Type Foam - Lift dressing to assess site every shift 06/22/22 2000    Estimated body mass index is 24.35 kg/m as calculated from the following:   Height as of this encounter: 5' 5.98" (1.676 m).   Weight as of this encounter: 68.4 kg.   DVT prophylaxis: lovenox Family Communication:Daughter Status is: Inpatient Remains inpatient appropriate because: Severe neurological damage      Code Status:     Code Status Orders  (From admission, onward)           Start     Ordered   05/24/22 0007  Full code  Continuous        05/24/22  0010           Code Status History     Date Active Date Inactive Code Status Order ID Comments User Context   12/08/2021 1444 12/09/2021 1509 Full Code [id number]  Eustace Moore, MD Inpatient   11/08/2019 2028 11/11/2019 1652 Full Code [id number]  Gwynne Edinger, MD ED   11/08/2019 2027 11/08/2019 2028 Full Code [id number]  Wouk, Ailene Rud, MD ED         IV Access:   Peripheral IV   Procedures and diagnostic studies:   DG CHEST PORT 1 VIEW  Result Date: 06/21/2022 CLINICAL DATA:  Fever with respiratory failure EXAM: PORTABLE CHEST 1 VIEW COMPARISON:  Chest 06/17/2022 FINDINGS: Tracheostomy remains in good position. Left arm PICC tip in the SVC. Right jugular catheter removed Improved aeration lungs. Improvement in bibasilar atelectasis. Improvement in bilateral edema. Small right effusion unchanged. IMPRESSION: Improved aeration with decrease in bilateral edema and bibasilar atelectasis. Small right effusion. Electronically Signed   By: Franchot Gallo M.D.   On: 06/21/2022 13:50     Medical Consultants:   None.   Subjective:    Valerie Cline nonverbal  Objective:    Vitals:   06/23/22 0400 06/23/22 0500 06/23/22 0600 06/23/22 0736  BP: (!) 113/51 123/63 125/61 121/63  Pulse: (!) 109 72 (!) 116 (!) 111  Resp: (!) 34 (!) 37 (!) 26 (!) 34  Temp: 98.3 F (36.8 C)     TempSrc: Axillary     SpO2: 95% (!) 89% 97% 98%  Weight: 68.4 kg     Height:       SpO2: 98 % O2 Flow Rate (L/min): 10 L/min FiO2 (%): 60 %   Intake/Output Summary (Last 24 hours) at 06/23/2022 0749 Last data filed at 06/23/2022 0600 Gross per 24 hour  Intake 2466.02 ml  Output 2470 ml  Net -3.98 ml    Filed Weights   06/18/22 0600 06/19/22 0600 06/23/22 0400  Weight: 68.9 kg 68.6 kg 68.4 kg    Exam: General exam: In no acute distress. Respiratory system: Good air movement and clear to auscultation. Cardiovascular system: S1 & S2 heard, RRR. No JVD. Gastrointestinal system: Abdomen is  nondistended, soft and nontender.  Extremities: No pedal edema. Skin: No rashes, lesions or ulcers Psychiatry: Unresponsive   Data Reviewed:    Labs: Basic Metabolic Panel: Recent Labs  Lab 06/16/22 0827 06/16/22 0828 06/16/22 1349 06/19/22 0447 06/20/22 0610 06/21/22 0319 06/22/22 0330 06/23/22 0338  NA  --  135   < > 137 140 144 148* 153*  K  --  3.3*   < > 3.5 3.2* 3.4* 3.0* 3.9  CL  --  96*   < > 96* 98 98 102 107  CO2  --  28   < > '30 31 31 '$ 35* 36*  GLUCOSE  --  81   < > 236* 209* 181* 209* 175*  BUN  --  51*   < > 66* 86* 93* 103* 108*  CREATININE  --  1.02*   < > 0.99 1.06* 1.07* 1.09* 1.06*  CALCIUM  --  8.5*   < > 8.4* 8.0* 8.4* 8.6* 8.8*  MG 1.8  --   --   --   --   --  1.7 1.9  PHOS  --  3.3  --   --   --   --   --   --    < > = values in this interval not displayed.    GFR Estimated Creatinine Clearance: 48.2 mL/min (A) (by C-G formula based on SCr of 1.06 mg/dL (H)). Liver Function Tests: Recent Labs  Lab 06/16/22 0828 06/17/22 0850  AST  --  32  ALT  --  54*  ALKPHOS  --  111  BILITOT  --  0.6  PROT  --  5.1*  ALBUMIN 2.6* 2.2*    No results for input(s): "LIPASE", "AMYLASE" in the last 168 hours. Recent Labs  Lab 06/17/22 0850  AMMONIA 37*    Coagulation profile Recent Labs  Lab 06/19/22 0447 06/20/22 0610 06/21/22 0319 06/22/22 0330 06/23/22 0338  INR 2.1* 1.8* 1.7* 1.7* 1.9*    COVID-19 Labs  No results for input(s): "DDIMER", "FERRITIN", "LDH", "CRP" in the last 72 hours.  Lab Results  Component Value Date   SARSCOV2NAA POSITIVE (A) 11/16/2021    CBC: Recent Labs  Lab 06/19/22 0447 06/20/22 0610 06/21/22 0319 06/22/22 0330 06/23/22 0338  WBC 6.1 4.7 4.4 5.5 6.9  HGB 8.1* 7.7* 8.0* 8.3* 8.7*  HCT 26.8* 25.4* 26.2* 27.8* 30.3*  MCV 87.9 87.9 86.8 89.1 91.0  PLT 149* 190 244 306 382    Cardiac Enzymes: No results for input(s): "CKTOTAL", "CKMB", "CKMBINDEX", "TROPONINI" in the last 168 hours. BNP (last 3  results) No results for input(s): "PROBNP" in the last 8760 hours. CBG: Recent Labs  Lab 06/22/22 1138 06/22/22 1649 06/22/22 2012 06/22/22 2306 06/23/22 0342  GLUCAP 162* 173* 199* 214* 161*    D-Dimer: No results for input(s): "DDIMER" in  the last 72 hours. Hgb A1c: No results for input(s): "HGBA1C" in the last 72 hours. Lipid Profile: No results for input(s): "CHOL", "HDL", "LDLCALC", "TRIG", "CHOLHDL", "LDLDIRECT" in the last 72 hours. Thyroid function studies: No results for input(s): "TSH", "T4TOTAL", "T3FREE", "THYROIDAB" in the last 72 hours.  Invalid input(s): "FREET3" Anemia work up: No results for input(s): "VITAMINB12", "FOLATE", "FERRITIN", "TIBC", "IRON", "RETICCTPCT" in the last 72 hours. Sepsis Labs: Recent Labs  Lab 06/17/22 1245 06/18/22 0438 06/20/22 0610 06/21/22 0319 06/22/22 0330 06/23/22 0338  PROCALCITON 0.78  --   --   --   --   --   WBC  --    < > 4.7 4.4 5.5 6.9   < > = values in this interval not displayed.    Microbiology Recent Results (from the past 240 hour(s))  Culture, Respiratory w Gram Stain     Status: None (Preliminary result)   Collection Time: 06/21/22 11:54 AM   Specimen: Tracheal Aspirate; Respiratory  Result Value Ref [place] Status   Specimen Description TRACHEAL ASPIRATE  Final   Special Requests NONE  Final   Gram Stain   Final    ABUNDANT SQUAMOUS EPITHELIAL CELLS PRESENT ABUNDANT WBC PRESENT, PREDOMINANTLY PMN FEW GRAM POSITIVE COCCI IN PAIRS RARE GRAM NEGATIVE RODS    Culture   Final    MODERATE STREPTOCOCCUS ANGINOSIS SUSCEPTIBILITIES TO FOLLOW Performed at [facility], [place] [address], [place], [place] [address]    Report Status PENDING  Incomplete  Remove and replace urinary cath (placed > 5 days) then obtain urine culture from new indwelling urinary catheter.     Status: None   Collection Time: 06/21/22 12:29 PM   Specimen: Urine, Catheterized  Result Value Ref [place] Status   Specimen Description  URINE, CATHETERIZED  Final   Special Requests NONE  Final   Culture   Final    NO GROWTH Performed at [facility], [address] [place], [place] [address]    Report Status 06/22/2022 FINAL  Final  Culture, blood (Routine X 2) w Reflex to ID Panel     Status: None (Preliminary result)   Collection Time: 06/21/22 12:31 PM   Specimen: BLOOD  Result Value Ref [place] Status   Specimen Description BLOOD SITE NOT SPECIFIED  Final   Special Requests   Final    BOTTLES DRAWN AEROBIC AND ANAEROBIC Blood Culture adequate volume   Culture   Final    NO GROWTH 2 DAYS Performed at [facility], [place] [address], [place], [place] [address]    Report Status PENDING  Incomplete  Culture, blood (Routine X 2) w Reflex to ID Panel     Status: None (Preliminary result)   Collection Time: 06/21/22 12:34 PM   Specimen: BLOOD RIGHT HAND  Result Value Ref [place] Status   Specimen Description BLOOD RIGHT HAND  Final   Special Requests AEROBIC BOTTLE ONLY Blood Culture adequate volume  Final   Culture   Final    NO GROWTH 2 DAYS Performed at [facility], [place] [address] [place], [place] [address]    Report Status PENDING  Incomplete     Medications:    Chlorhexidine Gluconate Cloth  6 each Topical Daily   fiber supplement (BANATROL TF)  60 mL Per Tube BID   free water  200 mL Per Tube Q6H   furosemide  80 mg Intravenous Daily   insulin aspart  0-20 Units Subcutaneous Q4H   insulin  aspart  10 Units Subcutaneous Q4H   insulin detemir  25 Units Subcutaneous BID   midodrine  2.5 mg Per Tube BID WC   modafinil  100 mg Per Tube Daily   multivitamin  1 tablet Per Tube QHS   mouth rinse  15 mL Mouth Rinse Q2H   pantoprazole  40 mg Per Tube Daily   sodium chloride flush  10-40 mL Intracatheter Q12H   Warfarin - Pharmacist Dosing Inpatient   Does not apply q1600   Continuous Infusions:  sodium chloride 5 mL/hr at 06/23/22 0600   sodium chloride Stopped (06/16/22 2348)   albumin  human 999 mL/hr at 06/16/22 1000   anticoagulant sodium citrate     feeding supplement (VITAL AF 1.2 CAL) 65 mL/hr at 06/23/22 0600   lacosamide (VIMPAT) IV Stopped (06/22/22 2254)   levETIRAcetam Stopped (06/22/22 2315)      LOS: 31 days   Charlynne Cousins  [facility]  06/23/2022, 7:49 AM

## 2022-06-23 NOTE — Progress Notes (Signed)
Patient seen today by trach team for consult.  No education is needed at this time.  All necessary equipment is at beside.   Will continue to follow for progression.  

## 2022-06-23 NOTE — Plan of Care (Signed)
  Problem: Clinical Measurements: Goal: Will remain free from infection Outcome: Progressing Goal: Diagnostic test results will improve Outcome: Progressing Goal: Respiratory complications will improve Outcome: Progressing Goal: Cardiovascular complication will be avoided Outcome: Progressing   Problem: Nutrition: Goal: Adequate nutrition will be maintained Outcome: Progressing   Problem: Elimination: Goal: Will not experience complications related to bowel motility Outcome: Progressing Goal: Will not experience complications related to urinary retention Outcome: Progressing   Problem: Safety: Goal: Ability to remain free from injury will improve Outcome: Progressing   Problem: Tissue Perfusion: Goal: Adequacy of tissue perfusion will improve Outcome: Progressing

## 2022-06-23 NOTE — Consult Note (Addendum)
Palliative Medicine Inpatient Consult Note  Consulting Provider: Charlynne Cousins, MD  Reason for consult:   [name]  Reason for Consult? end of life, not improving.    06/23/2022  HPI:  Per intake H&P --> Valerie Cline is an 66 y.o. female with non-Hodgkin's lymphoma on Rituxan (last dose in Valerie 2023), essential hypertension, diabetes mellitus type 2, carotid stenosis, recent head foot and mouth disease transferred from [name] for left upper extremity tremors at the facility neurological work-up and treatment for suspected viral meningitis. Has had a complicated thirty day hospitalization whereby she has been trached. As of presently no great neurological recovery has been identified. Palliative care has been asked to start goals of care conversations.   Clinical Assessment/Goals of Care:  *Please note that this is a verbal dictation therefore any spelling or grammatical errors are due to the "[name]" system interpretation.  I have reviewed medical records including EPIC notes, labs and imaging, received report from bedside RN, assessed the patient.    I met with Valerie Cline to further discuss diagnosis prognosis, GOC, EOL wishes, disposition and options.   I introduced Palliative Medicine as specialized medical care for people living with serious illness. It focuses on providing relief from the symptoms and stress of a serious illness. The goal is to improve quality of life for both the patient and the family.  Medical History Review and Understanding:  A brief review of Valerie Cline past medical history inclusive of her non-Hodgkin's lymphoma, type 2 diabetes, cardiovascular disease, and recent exposure/infection to hand-foot-and-mouth disease.  Valerie Cline has been infected with COVID twice once in 2020 and once in 2023.  In 2020 she was hospitalized for 2 days and shortly after that was identified to have  B-cell lymphoma.  Social History:  Valerie Cline has been married to her spouse Valerie Cline for the past 9 years.  They share 1 daughter, Valerie Cline and 1 grandchild.  [name] worked in [employer] but retired 2 years ago.  She is close to her 2 sisters and they actually had a singing group the [name].  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  She is a woman of very strong faith and practices within the [name] denomination.  Functional and Nutritional State:  Preceding hospitalization [name] was doing very well and was fully able to support her BADLs and IADLs.  Advance Directives:  A detailed discussion was had today regarding advanced directives.  Patient's husband Valerie Cline and daughter Valerie Cline are her surrogate decision makers.  Code Status:  Encouraged patient/family to consider DNAR status understanding evidenced based poor outcomes in similar hospitalized patient, as the cause of arrest is likely associated with advanced chronic/terminal illness rather than an easily reversible acute cardio-pulmonary event. I explained that DNR/DNI does not change the medical plan and it only comes into effect after a person has arrested (died).  It is a protective measure to keep [place] from harming the patient in their last moments of life.  Valerie Cline spoke to her father Valerie Cline and they are both agreeable to DO NOT RESUSCITATE though for the time being would like to continue with full scope of medical care.  Discussion:  We discussed the difficulty associated with [name] 30-day hospital stay.  We discussed that [name] had recently been at [name] with their large family vacation which they do annually.  During that time she was exposed to hand-foot-and-mouth which her grandson had contracted.  Patient's  daughter expresses a lot of guilt that allowing [name] to be with her 69-1/2-year-old grandson provoked everything that is occurring right now.  I provided emotional support through therapeutic listening.   I provided reassurance that this may have contributed but there are a lot of other reasons why [name] may have also become ill.  We discussed that it has been very hard to contend with [name] present clinical state.  Last week Valerie Cline and her father had a discussion given the lack of improvement in [name] and they were determining which direction to travel.  After that [name] to them was more alert and aware which made their decisions even more conflicting.  It is now been over a week since that occurred and Saharah has declined further per her daughter.  We discussed that moving forward it will be important to understand how aggressively we would want to treat [name].  Patient's daughter Valerie Cline was clear that she does not want to see her mother suffer for months on and.  She shares that the neurology team had expressed waiting until the end of the month which will be a week from today.  Valerie Cline also shares that Dr. Olevia Bowens had told her there is some dysfunction in Unice's brain leading to a decrease in urine output which in some ways makes decisions more black-and-white for her.  We discussed how difficult it is to make decisions when you are on a middle ground and not sure which way things will go and wishing to advocate in the best interest of your loved one.  We reviewed that I would be coming by tomorrow morning to meet with Jauna's husband Valerie Cline and have additional conversations.  I shared that if anything were to change I can be reached at any time.  Discussed the importance of continued conversation with family and their  medical providers regarding overall plan of care and treatment options, ensuring decisions are within the context of the patients values and GOCs. __________________________________ Addendum:  I met with patients spouse and daughter this later afternoon. We reviewed that after conversations with one another the plan will be for transition to comfort care tomorrow. Reviewed what  that would look like. We talked about transition to comfort measures in house and what that would entail inclusive of medications to control pain, dyspnea, agitation, nausea, itching, and hiccups.  We discussed stopping all uneccessary measures such as cardiac monitoring, blood draws, needle sticks, and frequent vital signs.   Patients family would like everyone who is important to [place] to get the opportunity to come and say goodbye.   Likely transition to comfort on Saturday afternoon.   Additional Time: 4  Decision Maker:  Valerie Cline,Valerie Cline (Spouse): [phone] (Mobile)   SUMMARY OF RECOMMENDATIONS   DNAR  Continue full scope of medical care at this time  Patients family planning to wait until the end of the month for additional decisions though per daughter, Valerie Cline this may change as some declines are being seen sooner  Ongoing support  Code Status/Advance Care Planning: DNAR  Palliative Prophylaxis:  Aspiration, Bowel Regimen, Delirium Protocol, Frequent Pain Assessment, Oral Care, Palliative Wound Care, and Turn Reposition  Additional Recommendations (Limitations, Scope, Preferences): Continue full scope of medical care  Psycho-social/Spiritual:  Desire for further Chaplaincy support: Not presently Additional Recommendations: Review of patients clinical state   Prognosis: Overall appears very poor given lack of meaningful improvement  Discharge Planning: Unclear  Vitals:   06/23/22 0500 06/23/22 0600  BP: 123/63 125/61  Pulse:  72 (!) 116  Resp: (!) 37 (!) 26  Temp:    SpO2: (!) 89% 97%    Intake/Output Summary (Last 24 hours) at 06/23/2022 9444 Last data filed at 06/23/2022 0600 Gross per 24 hour  Intake 2466.02 ml  Output 2470 ml  Net -3.98 ml   Last Weight  Most recent update: 06/23/2022  6:48 AM    Weight  68.4 kg (150 lb 12.7 oz)            Gen: Elderly F in NAD HEENT: Tracheostomy, Dry mucous membranes CV: Irregular rate and rhythm PULM: Trach  collar ABD: soft/nontender, (+) Gtube EXT: Gen. edema Neuro: Somnolent  PPS: 10%   This conversation/these recommendations were discussed with patient primary care team, Dr. Aileen Fass  Time: 90 minutes  Billing based on MDM: High  Problems Addressed: One acute or chronic illness or injury that poses a threat to life or bodily function  Amount and/or Complexity of Data: Category 3:Discussion of management or test interpretation with external physician/other qualified health care professional/appropriate source (not separately reported)  Risks: Decision not to resuscitate or to de-escalate care because of poor prognosis ______________________________________________________ [place] Team Team Cell Phone: [phone] Please utilize secure chat with additional questions, if there is no response within 30 minutes please call the above phone number  Palliative Medicine Team providers are available by phone from 7am to 7pm daily and can be reached through the team cell phone.  Should this patient require assistance outside of these hours, please call the patient's attending physician.

## 2022-06-23 NOTE — Procedures (Signed)
MRN: [id number]  Epilepsy Attending: Lora Havens  Referring Physician/Provider: Lora Havens, MD Duration: 06/22/2022 1503 to 06/23/2022 1213   Patient history: 66yo F with ams. EEG to evaluate for seizure   Level of alertness: lethargic   AEDs during EEG study: Keppra, Vimpat   Technical aspects: This EEG study was done with scalp electrodes positioned according to the 10-20 International system of electrode placement. Electrical activity was acquired at a sampling rate of '500Hz'$  and reviewed with a high frequency filter of '70Hz'$  and a low frequency filter of '1Hz'$ . EEG data were recorded continuously and digitally stored.    Description: During awake state, no clear posterior dominant rhythm was seen.  Sleep was characterized by sleep spindles (12 to 14 Hz), maximal frontocentral region.  EEG showed continuous generalized 5 to 9 Hz theta-alpha activity admixed with 12 to 14 Hz generalized beta activity.  Continuous 3 to 5 Hz theta-delta slowing was also noted in left posterior quadrant.  Lateralized periodic discharges were noted in left posterior quadrant at 1-1.'5Hz'$ .    ABNORMALITY -Lateralized periodic discharges, left hemisphere, maximal posterior quadrant -Continuous slow, generalized and maximal left posterior quadrant   IMPRESSION: This study showed evidence of epileptogenicity and cortical dysfunction in left posterior quadrant likely due to underlying structural abnormality with increased risk of seizure recurrence. Additionally there was moderate to severe diffuse encephalopathy, nonspecific etiology. No definite seizures were seen during this study.   Victoriano Campion [name]

## 2022-06-23 NOTE — Progress Notes (Signed)
Subjective: Daughter at bedside states patient had intermittent right facial twitching as well as eye twitching yesterday and day before yesterday.  She denies seeing any twitching today.  ROS: Unable to obtain due to poor mental status  Examination  Vital signs in last 24 hours: Temp:  [98 F (36.7 C)-99 F (37.2 C)] 98.3 F (36.8 C) (08/24 0800) Pulse Rate:  [72-119] 116 (08/24 1135) Resp:  [17-46] 31 (08/24 1135) BP: (104-150)/(51-84) 119/63 (08/24 1135) SpO2:  [82 %-100 %] 98 % (08/24 1135) FiO2 (%):  [35 %-98 %] 40 % (08/24 1135) Weight:  [68.4 kg] 68.4 kg (08/24 0400)  General: lying in bed, +trach and peg Neuro: Opens eyes after helping her but did not follow commands and does not track, appears to have roving eye movements, at times with right beating nystagmus, left gaze preference but is able to cross midline with oculocephalics, winces to noxious stimuli in all extremities with subtle right corner of mouth twitch  Basic Metabolic Panel: Recent Labs  Lab 06/19/22 0447 06/20/22 0610 06/21/22 0319 06/22/22 0330 06/23/22 0338  NA 137 140 144 148* 153*  K 3.5 3.2* 3.4* 3.0* 3.9  CL 96* 98 98 102 107  CO2 '30 31 31 '$ 35* 36*  GLUCOSE 236* 209* 181* 209* 175*  BUN 66* 86* 93* 103* 108*  CREATININE 0.99 1.06* 1.07* 1.09* 1.06*  CALCIUM 8.4* 8.0* 8.4* 8.6* 8.8*  MG  --   --   --  1.7 1.9    CBC: Recent Labs  Lab 06/19/22 0447 06/20/22 0610 06/21/22 0319 06/22/22 0330 06/23/22 0338  WBC 6.1 4.7 4.4 5.5 6.9  HGB 8.1* 7.7* 8.0* 8.3* 8.7*  HCT 26.8* 25.4* 26.2* 27.8* 30.3*  MCV 87.9 87.9 86.8 89.1 91.0  PLT 149* 190 244 306 382     Coagulation Studies: Recent Labs    06/21/22 0319 06/22/22 0330 06/23/22 0338  LABPROT 19.8* 20.1* 21.3*  INR 1.7* 1.7* 1.9*    Imaging No new brain imaging overnight   ASSESSMENT AND PLAN: 66 year old female with 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   Focal convulsive status epilepticus, resolved Provoked seizures Acute encephalopathy, infectious Entero-Viral meningo-encephalitis Left meningioma Acute ischemic stroke, incidental Obstructive hydrocephalus Immunocompromised  History of small lymphocytic like B-cell non-Hodgkin lymphoma -Patient had exposure to hand-foot-and-mouth disease and subsequently enterovirus encephalitis.   -Status epilepticus likely due to viral encephalitis, meningioma (meningiomas do not commonly cause status epilepticus but the location of this meningioma is concerning).   -Ischemic strokes and likely due to encephalitis   Recommendations -Continue Modafinil 100 mg daily for neurostimulation -Continue Keppra to 1000 mg twice daily (increased yesterday evening after LPD on EEG) and continue Vimpat 200 mg twice daily. -Discussed with daughter Sharee Pimple at bedside.  Patient has not had sustained improvement in neurologic status.  I suspect this is due to irreversible neurologic injury which is difficult to visualize on MRI brain.  Family planning to transition to comfort care tomorrow -Will DC LTM EEG -Continue management of rest of comorbidities per primary team - Discussed plan with daughter at bedside as well as medicine team and palliative care team. Really appreciate palliative care team's assistance with goals of care   I have spent a total of 38 minutes with the patient reviewing hospital notes,  test results, labs and examining the patient as well as establishing an assessment and plan.  > 50% of time was spent in direct patient care.   Sariya Trickey  Mikya Don Epilepsy [facility] For questions after 5pm please refer to AMION to reach the Neurologist on call

## 2022-06-24 DIAGNOSIS — G039 Meningitis, unspecified: Secondary | ICD-10-CM | POA: Diagnosis not present

## 2022-06-24 DIAGNOSIS — Z515 Encounter for palliative care: Secondary | ICD-10-CM | POA: Diagnosis not present

## 2022-06-24 LAB — GLUCOSE, CAPILLARY
Glucose-Capillary: 212 mg/dL — ABNORMAL HIGH (ref 70–99)
Glucose-Capillary: 223 mg/dL — ABNORMAL HIGH (ref 70–99)
Glucose-Capillary: 195 mg/dL — ABNORMAL HIGH (ref 70–99)
Glucose-Capillary: 213 mg/dL — ABNORMAL HIGH (ref 70–99)
Glucose-Capillary: 189 mg/dL — ABNORMAL HIGH (ref 70–99)

## 2022-06-24 LAB — BASIC METABOLIC PANEL
Anion gap: 10 (ref 5–15)
Anion gap: 9 (ref 5–15)
BUN: 110 mg/dL — ABNORMAL HIGH (ref 8–23)
BUN: 103 mg/dL — ABNORMAL HIGH (ref 8–23)
CO2: 35 mmol/L — ABNORMAL HIGH (ref 22–32)
CO2: 35 mmol/L — ABNORMAL HIGH (ref 22–32)
Calcium: 8.6 mg/dL — ABNORMAL LOW (ref 8.9–10.3)
Calcium: 8.5 mg/dL — ABNORMAL LOW (ref 8.9–10.3)
Chloride: 105 mmol/L (ref 98–111)
Chloride: 106 mmol/L (ref 98–111)
Creatinine, Ser: 0.95 mg/dL (ref 0.44–1.00)
Creatinine, Ser: 0.9 mg/dL (ref 0.44–1.00)
GFR, Estimated: >60 mL/min (ref >60)
GFR, Estimated: >60 mL/min (ref >60)
Glucose, Bld: 259 mg/dL — ABNORMAL HIGH (ref 70–99)
Glucose, Bld: 196 mg/dL — ABNORMAL HIGH (ref 70–99)
Potassium: 3.8 mmol/L (ref 3.5–5.1)
Potassium: 3.6 mmol/L (ref 3.5–5.1)
Sodium: 150 mmol/L — ABNORMAL HIGH (ref 135–145)
Sodium: 150 mmol/L — ABNORMAL HIGH (ref 135–145)

## 2022-06-24 LAB — CBC
HCT: 27.7 % — ABNORMAL LOW (ref 36.0–46.0)
Hemoglobin: 8 g/dL — ABNORMAL LOW (ref 12.0–15.0)
MCH: 26.3 pg (ref 26.0–34.0)
MCHC: 28.9 g/dL — ABNORMAL LOW (ref 30.0–36.0)
MCV: 91.1 fL (ref 80.0–100.0)
Platelets: 342 10*3/uL (ref 150–400)
RBC: 3.04 MIL/uL — ABNORMAL LOW (ref 3.87–5.11)
RDW: 20.7 % — ABNORMAL HIGH (ref 11.5–15.5)
WBC: 6.3 10*3/uL (ref 4.0–10.5)
nRBC: 0 % (ref 0.0–0.2)

## 2022-06-24 LAB — PROTIME-INR
INR: 1.9 — ABNORMAL HIGH (ref 0.8–1.2)
Prothrombin Time: 22 seconds — ABNORMAL HIGH (ref 11.4–15.2)

## 2022-06-24 LAB — FUNGUS CULTURE WITH STAIN

## 2022-06-24 LAB — FUNGUS CULTURE RESULT

## 2022-06-24 LAB — FUNGAL ORGANISM REFLEX

## 2022-06-24 MED ORDER — WARFARIN SODIUM 5 MG PO TABS
7.5000 mg | ORAL_TABLET | Freq: Once | ORAL | Status: AC
  Administered 2022-06-24: 7.5 mg
  Filled 2022-06-24: qty 1

## 2022-06-24 MED ORDER — DEXTROSE 5 % IV SOLN
INTRAVENOUS | Status: DC
Start: 2022-06-24 — End: 2022-06-25

## 2022-06-24 MED ORDER — FREE WATER
400.0000 mL | Freq: Four times a day (QID) | Status: DC
Start: 2022-06-24 — End: 2022-06-25
  Administered 2022-06-24 – 2022-06-25 (×4): 400 mL

## 2022-06-24 NOTE — Progress Notes (Signed)
[facility] PROGRESS NOTE    Progress Note  Valerie Cline  NKN:[id number] DOB: [birth year] DOA: 05/25/2022 PCP: Ginger Organ., MD     Brief Narrative:   Valerie Cline is an 66 y.o. female with non-Hodgkin's lymphoma on Rituxan (last dose in June 2023), essential hypertension, diabetes mellitus type 2, carotid stenosis, recent head foot and mouth disease transferred from [facility] for left upper extremity tremors at the facility neurological work-up was negative  EEG notes seizures, MRI of the brain showed a 13 mm left frontal artifact versus calcified meningioma, chronic small vessel disease cerebral atrophy and high-grade stenosis bilaterally in the ICAs, focal stenosis of the left ACA intracranial right vertebral artery, MRI of the C-spine shows C4-C5 compression from advanced disc disease.  She started developing fever and transferred to the ICU there was a concern for meningitis started on empirically on Vanco Rocephin, ampicillin and acyclovir LP was performed on 05/23/2021 that showed 74 white blood cells 2 red blood cells, CT AP showed bilateral groundglass opacities concerning for multifocal pneumonia.  PCCM was consulted as there was decreased responsive had hypoxia on a nonrebreather, basically obtunded    Significant Events: 7/24 Admitted to [facility] from [place]. Transferred to PCCM when found in respiratory failure 7/25 intubated, bronched, art line placed, PICC placed.  Neuro and ID consulted. Started decadron  7/26 decadron dc/d 7/27 felt most consistent with viral meningoencephalitis  7/29-7/30 sudden onset multiorgan failure; given tpa with improvement 8/4 trach 8/6 trach bleeding 8/12 off sedation awaiting any neurologic response. 8/15 transitioned off CRRT to iHD 8/16 tolerating trach collar 8/20 trach exchange to cuffless trach 8/22 Fever > work up   Antibiotics: None  Microbiology data: Blood culture:  Procedures: None  Assessment/Plan:    Acute infectious encephalopathy likely due to viral meningoencephalitis CSF was positive for enterovirus, in the setting of and foot and mouth disease. Completed course of antiviral therapy and bacterial meningitis antibiotic. Neurology was consulted was started on modafinil for neuro stimulation. Apparently encephalopathy continues to be severe. She is wean off sedation 06/04/2022. Dr. Hortense Ramal is on board and suggested watching her an additional week for neurological recovery then reassess in goals of care. She also recommended to switch Keppra to twice a day along with Vimpat twice a day. Family agreed to to move towards comfort measures tomorrow as they have some family coming over. Her blood pressure is dysregulated he continues to bounce up and down.  Focal convulsive status epilepticus: Now resolved, in the setting of viral encephalitis continue Keppra and Vimpat.  Cervical myelopathy status post ACDF C5 C7: Has completed steroids.  Acute kidney injury: With a baseline creatinine of around 0.8 started on CRRT on 05/29/2022, then HD on 06/15/2022. Has had good urine output, renal on board. Recommended to remove temporary catheter for dialysis. Patient will not require outpatient HD at this time. Nephrology will sign off.  Hypokalemia: Likely due to Lasix challenge, repleted orally, now improved.  Acute respiratory failure with hypoxia and hypercapnia due to multifocal pneumonia: Status post tracheostomy. Completed course of meropenem. Continue trach care per PCCM, has been exchanged on 06/20/2022.  Acute pulmonary emboli: CT angio of the chest on 06/04/2022 showed possible pulmonary emboli. Currently on Coumadin INR sub-therapeutic. Continue Coumadin per pharmacy.  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 Status post IVIG empirically with little  improvement. Dr. Marin Olp is aware.  Diabetes  mellitus type 2: With an unknown A1c, currently on long-acting insulin plus sliding scale, her blood glucose has risen likely due to D5. We will increase free water per tube.  Newly diagnosed central diabetes insipidus: Negative about 8 L with strong urine output, serum osmolarity was 376, urine was 377. Started on D5 and free water per tube and her sodium is coming down slowly. This morning is 150 we will go ahead and increase D5W.  Sudden onset of shock: Now off pressors. Blood pressure has remained relatively stable slowly trending up. Currently on midodrine we will start weaning off. This is probably contributing to her tachycardia.  Dysphagia: Status post PEG tube, currently in tube feeding start free water per tube.  Fever of unclear source: Tmax of 101 over 48 hours ago, she was started on Tylenol, has no leukocytosis. Urine cultures and blood cultures were sent on 06/21/2022. Chest x-ray showed improved variation with decreased bilateral edema small right pleural effusion.  Stage II sacral decubitus ulcer RN Pressure Injury Documentation: Pressure Injury 06/01/22 Sacrum Mid;Lower Stage 2 -  Partial thickness loss of dermis presenting as a shallow open injury with a red, pink wound bed without slough. pink wound bed size of penny with red broken skin in middle (Active)  06/01/22 1200  Location: Sacrum  Location Orientation: Mid;Lower  Staging: Stage 2 -  Partial thickness loss of dermis presenting as a shallow open injury with a red, pink wound bed without slough.  Wound Description (Comments): pink wound bed size of penny with red broken skin in middle  Present on Admission:   Dressing Type Foam - Lift dressing to assess site every shift 06/23/22 1930    Estimated body mass index is 24.35 kg/m as calculated from the following:   Height as of this encounter: 5' 5.98" (1.676 m).   Weight as of this encounter: 68.4 kg.   DVT  prophylaxis: lovenox Family Communication:Daughter Status is: Inpatient Remains inpatient appropriate because: Severe neurological damage      Code Status:     Code Status Orders  (From admission, onward)           Start     Ordered   05/24/22 0007  Full code  Continuous        05/24/22 0010           Code Status History     Date Active Date Inactive Code Status Order ID Comments User Context   12/08/2021 1444 12/09/2021 1509 Full Code [id number]  Eustace Moore, MD Inpatient   11/08/2019 2028 11/11/2019 1652 Full Code [id number]  Gwynne Edinger, MD ED   11/08/2019 2027 11/08/2019 2028 Full Code [id number]  Wouk, Ailene Rud, MD ED         IV Access:   Peripheral IV   Procedures and diagnostic studies:   No results found.   Medical Consultants:   None.   Subjective:    TAKEESHA Salim nonverbal  Objective:    Vitals:   06/24/22 0400 06/24/22 0500 06/24/22 0600 06/24/22 0710  BP: (!) 93/48 (!) 126/57 (!) 128/57 (!) 115/54  Pulse: 93 96 (!) 108 (!) 106  Resp: (!) 25 (!) 24 (!) 24 16  Temp: 98.3 F (36.8 C)     TempSrc: Axillary     SpO2: 97% 98% 95% 99%  Weight:      Height:       SpO2: 99 % O2 Flow Rate (L/min): 8 L/min FiO2 (%): 35 %  Intake/Output Summary (Last 24 hours) at 06/24/2022 0721 Last data filed at 06/24/2022 0501 Gross per 24 hour  Intake 3189.1 ml  Output 1475 ml  Net 1714.1 ml    Filed Weights   06/18/22 0600 06/19/22 0600 06/23/22 0400  Weight: 68.9 kg 68.6 kg 68.4 kg    Exam: General exam: In no acute distress. Respiratory system: Good air movement and clear to auscultation. Cardiovascular system: S1 & S2 heard, RRR. No JVD. Gastrointestinal system: Abdomen is nondistended, soft and nontender.  Extremities: No pedal edema. Skin: No rashes, lesions or ulcers Psychiatry: Unresponsive   Data Reviewed:    Labs: Basic Metabolic Panel: Recent Labs  Lab 06/21/22 0319 06/22/22 0330 06/23/22 0338  06/23/22 1651 06/24/22 0445  NA 144 148* 153* 146* 150*  K 3.4* 3.0* 3.9 4.2 3.8  CL 98 102 107 101 105  CO2 31 35* 36* 35* 35*  GLUCOSE 181* 209* 175* 422* 259*  BUN 93* 103* 108* 108* 110*  CREATININE 1.07* 1.09* 1.06* 0.98 0.95  CALCIUM 8.4* 8.6* 8.8* 8.5* 8.6*  MG  --  1.7 1.9  --   --     GFR Estimated Creatinine Clearance: 53.8 mL/min (by C-G formula based on SCr of 0.95 mg/dL). Liver Function Tests: Recent Labs  Lab 06/17/22 0850  AST 32  ALT 54*  ALKPHOS 111  BILITOT 0.6  PROT 5.1*  ALBUMIN 2.2*    No results for input(s): "LIPASE", "AMYLASE" in the last 168 hours. Recent Labs  Lab 06/17/22 0850  AMMONIA 37*    Coagulation profile Recent Labs  Lab 06/20/22 0610 06/21/22 0319 06/22/22 0330 06/23/22 0338 06/24/22 0445  INR 1.8* 1.7* 1.7* 1.9* 1.9*    COVID-19 Labs  No results for input(s): "DDIMER", "FERRITIN", "LDH", "CRP" in the last 72 hours.  Lab Results  Component Value Date   SARSCOV2NAA POSITIVE (A) 11/16/2021    CBC: Recent Labs  Lab 06/20/22 0610 06/21/22 0319 06/22/22 0330 06/23/22 0338 06/24/22 0445  WBC 4.7 4.4 5.5 6.9 6.3  HGB 7.7* 8.0* 8.3* 8.7* 8.0*  HCT 25.4* 26.2* 27.8* 30.3* 27.7*  MCV 87.9 86.8 89.1 91.0 91.1  PLT 190 244 306 382 342    Cardiac Enzymes: No results for input(s): "CKTOTAL", "CKMB", "CKMBINDEX", "TROPONINI" in the last 168 hours. BNP (last 3 results) No results for input(s): "PROBNP" in the last 8760 hours. CBG: Recent Labs  Lab 06/23/22 1142 06/23/22 1553 06/23/22 1947 06/23/22 2317 06/24/22 0439  GLUCAP 251* 200* 229* 199* 212*    D-Dimer: No results for input(s): "DDIMER" in the last 72 hours. Hgb A1c: No results for input(s): "HGBA1C" in the last 72 hours. Lipid Profile: No results for input(s): "CHOL", "HDL", "LDLCALC", "TRIG", "CHOLHDL", "LDLDIRECT" in the last 72 hours. Thyroid function studies: No results for input(s): "TSH", "T4TOTAL", "T3FREE", "THYROIDAB" in the last 72  hours.  Invalid input(s): "FREET3" Anemia work up: No results for input(s): "VITAMINB12", "FOLATE", "FERRITIN", "TIBC", "IRON", "RETICCTPCT" in the last 72 hours. Sepsis Labs: Recent Labs  Lab 06/17/22 1245 06/18/22 0438 06/21/22 0319 06/22/22 0330 06/23/22 0338 06/24/22 0445  PROCALCITON 0.78  --   --   --   --   --   WBC  --    < > 4.4 5.5 6.9 6.3   < > = values in this interval not displayed.    Microbiology Recent Results (from the past 240 hour(s))  Culture, Respiratory w Gram Stain     Status: None (Preliminary result)   Collection Time: 06/21/22  11:54 AM   Specimen: Tracheal Aspirate; Respiratory  Result Value Ref Range Status   Specimen Description TRACHEAL ASPIRATE  Final   Special Requests NONE  Final   Gram Stain   Final    ABUNDANT SQUAMOUS EPITHELIAL CELLS PRESENT ABUNDANT WBC PRESENT, PREDOMINANTLY PMN FEW GRAM POSITIVE COCCI IN PAIRS RARE GRAM NEGATIVE RODS    Culture   Final    MODERATE STREPTOCOCCUS ANGINOSIS REPEATING SUSCEPTIBILITY Performed at [facility], [place] [address], [place], [place] [address]    Report Status PENDING  Incomplete  Remove and replace urinary cath (placed > 5 days) then obtain urine culture from new indwelling urinary catheter.     Status: None   Collection Time: 06/21/22 12:29 PM   Specimen: Urine, Catheterized  Result Value Ref Range Status   Specimen Description URINE, CATHETERIZED  Final   Special Requests NONE  Final   Culture   Final    NO GROWTH Performed at [facility], [address], [place], [place] [address]    Report Status 06/22/2022 FINAL  Final  Culture, blood (Routine X 2) w Reflex to ID Panel     Status: None (Preliminary result)   Collection Time: 06/21/22 12:31 PM   Specimen: BLOOD  Result Value Ref Range Status   Specimen Description BLOOD SITE NOT SPECIFIED  Final   Special Requests   Final    BOTTLES DRAWN AEROBIC AND ANAEROBIC Blood Culture adequate volume   Culture   Final    NO  GROWTH 2 DAYS Performed at [facility], [place] [address] [place], [place] [address]    Report Status PENDING  Incomplete  Culture, blood (Routine X 2) w Reflex to ID Panel     Status: None (Preliminary result)   Collection Time: 06/21/22 12:34 PM   Specimen: BLOOD RIGHT HAND  Result Value Ref Range Status   Specimen Description BLOOD RIGHT HAND  Final   Special Requests AEROBIC BOTTLE ONLY Blood Culture adequate volume  Final   Culture   Final    NO GROWTH 2 DAYS Performed at [facility], [place] [address] [place], [place] [address]    Report Status PENDING  Incomplete     Medications:    Chlorhexidine Gluconate Cloth  6 each Topical Daily   fiber supplement (BANATROL TF)  60 mL Per Tube BID   free water  200 mL Per Tube Q6H   insulin aspart  0-20 Units Subcutaneous Q4H   insulin aspart  10 Units Subcutaneous Q4H   insulin detemir  25 Units Subcutaneous BID   midodrine  2.5 mg Per Tube BID WC   modafinil  100 mg Per Tube Daily   multivitamin  1 tablet Per Tube QHS   mouth rinse  15 mL Mouth Rinse Q2H   pantoprazole  40 mg Per Tube Daily   sodium chloride flush  10-40 mL Intracatheter Q12H   Warfarin - Pharmacist Dosing Inpatient   Does not apply q1600   Continuous Infusions:  sodium chloride 5 mL/hr at 06/24/22 0600   sodium chloride Stopped (06/16/22 2348)   albumin human 999 mL/hr at 06/16/22 1000   anticoagulant sodium citrate     dextrose 75 mL/hr at 06/24/22 0646   feeding supplement (VITAL AF 1.2 CAL) 65 mL/hr at 06/24/22 0600   lacosamide (VIMPAT) IV Stopped (06/23/22 2328)   levETIRAcetam Stopped (06/23/22 2345)      LOS: 32 days   Charlynne Cousins  [facility]  06/24/2022, 7:21 AM

## 2022-06-24 NOTE — Progress Notes (Signed)
[place] for heparin > warfarin  Indication: pulmonary embolus  Allergies  Allergen Reactions   Canagliflozin Other (See Comments)    recurrent vaginitis   Glucophage [Metformin] Other (See Comments)    GI intolerance   Niaspan [Niacin] Other (See Comments)    flushing    Patient Measurements: Height: 5' 5.98" (167.6 cm) Weight: 68.4 kg (150 lb 12.7 oz) IBW/kg (Calculated) : 59.25 Heparin Dosing Weight: 79kg  Vital Signs: Temp: 98.6 F (37 C) (08/25 0800) Temp Source: Axillary (08/25 0800) BP: 115/54 (08/25 0734) Pulse Rate: 106 (08/25 0734)  Labs: Recent Labs    06/22/22 0330 06/23/22 0338 06/23/22 1651 06/24/22 0445  HGB 8.3* 8.7*  --  8.0*  HCT 27.8* 30.3*  --  27.7*  PLT 306 382  --  342  LABPROT 20.1* 21.3*  --  22.0*  INR 1.7* 1.9*  --  1.9*  CREATININE 1.09* 1.06* 0.98 0.95     Estimated Creatinine Clearance: 53.8 mL/min (by C-G formula based on SCr of 0.95 mg/dL).   Assessment: 60 yoF admitted with meningitis from OSH. Pt with ongoing fevers and worsening clinical status 7/29, RV severely down on beside ECHO so concerns for acute PE. Pt received alteplase on 7/30 for suspected saddle PE.    Heparin stopped 8/14 with therapeutic INR. INR today remains close to goal at 1.9. Hgb 8, plts WNL. No s/sx of bleeding. Pt has been transitioned from CRRT to [facility] - last session 8/19, currently no plans for iHD and just allow renal to recover. TF at goal on Vital AF.   Goal of Therapy:  INR 2-3 Monitor platelets by anticoagulation protocol: Yes   Plan:  Warfarin 7.5 mg tonight to try to get into goal range  Daily heparin level, INR, and CBC  Monitor for s/sx bleeding   Antonietta Jewel, PharmD, BCCCP Clinical Pharmacist  Phone: [phone] 06/24/2022 11:48 AM  Please check AMION for all [place] phone numbers After 10:00 PM, call [place] [phone]

## 2022-06-24 NOTE — Progress Notes (Signed)
   Palliative Medicine Inpatient Follow Up Note  HPI: Valerie Cline is an 66 y.o. female with non-Hodgkin's lymphoma on Rituxan (last dose in June 2023), essential hypertension, diabetes mellitus type 2, carotid stenosis, recent head foot and mouth disease transferred from [facility] for left upper extremity tremors at the facility neurological work-up and treatment for suspected viral meningitis. Has had a complicated thirty day hospitalization whereby she has been trached. As of presently no great neurological recovery has been identified. Palliative care has been asked to start goals of care conversations.   Today's Discussion 06/24/2022  *Please note that this is a verbal dictation therefore any spelling or grammatical errors are due to the "[place] [facility]" system interpretation.  Chart reviewed inclusive of vital signs, progress notes, laboratory results, and diagnostic images.   I met with patients husband, Valerie Cline at bedside. Created space and opportunity for patient to explore thoughts feelings and fears regarding current medical situation. He shares that Valerie Cline has some nieces that or going to visit today. He would like to allow this and tomorrow transition our focus to comfort based care. We again reviewed what comfort looks like in anticipation.   Questions and concerns addressed/Palliative Support Provided.   Objective Assessment: Vital Signs Vitals:   06/24/22 0734 06/24/22 0800  BP: (!) 115/54   Pulse: (!) 106   Resp: 16   Temp:  98.6 F (37 C)  SpO2: 96%     Intake/Output Summary (Last 24 hours) at 06/24/2022 1043 Last data filed at 06/24/2022 0501 Gross per 24 hour  Intake 2850.36 ml  Output 1475 ml  Net 1375.36 ml   Last Weight  Most recent update: 06/23/2022  6:48 AM    Weight  68.4 kg (150 lb 12.7 oz)            Gen: Elderly F in NAD HEENT: Tracheostomy, Dry mucous membranes CV: Irregular rate and rhythm PULM: Trach collar ABD: soft/nontender, (+)  Gtube EXT: Gen. edema Neuro: Somnolent  SUMMARY OF RECOMMENDATIONS   DNAR   Continue full scope of medical care at this time   Plan for patients family to visit today and transition to comfort emphasis tomorrow  Ongoing support  Billing based on MDM: High  Problems Addressed: One acute or chronic illness or injury that poses a threat to life or bodily function  Amount and/or Complexity of Data: Category 3:Discussion of management or test interpretation with external physician/other qualified health care professional/appropriate source (not separately reported)  Risks: Decision not to resuscitate or to de-escalate care because of poor prognosis ______________________________________________________________________________________ [place] Team Team Cell Phone: [phone] Please utilize secure chat with additional questions, if there is no response within 30 minutes please call the above phone number  Palliative Medicine Team providers are available by phone from 7am to 7pm daily and can be reached through the team cell phone.  Should this patient require assistance outside of these hours, please call the patient's attending physician.

## 2022-06-25 DIAGNOSIS — Z66 Do not resuscitate: Secondary | ICD-10-CM

## 2022-06-25 DIAGNOSIS — Z7189 Other specified counseling: Secondary | ICD-10-CM | POA: Diagnosis not present

## 2022-06-25 DIAGNOSIS — G039 Meningitis, unspecified: Secondary | ICD-10-CM | POA: Diagnosis not present

## 2022-06-25 DIAGNOSIS — Z515 Encounter for palliative care: Secondary | ICD-10-CM | POA: Diagnosis not present

## 2022-06-25 LAB — BASIC METABOLIC PANEL
Anion gap: 9 (ref 5–15)
BUN: 103 mg/dL — ABNORMAL HIGH (ref 8–23)
CO2: 33 mmol/L — ABNORMAL HIGH (ref 22–32)
Calcium: 8.2 mg/dL — ABNORMAL LOW (ref 8.9–10.3)
Chloride: 102 mmol/L (ref 98–111)
Creatinine, Ser: 0.9 mg/dL (ref 0.44–1.00)
GFR, Estimated: >60 mL/min (ref >60)
Glucose, Bld: 270 mg/dL — ABNORMAL HIGH (ref 70–99)
Potassium: 3.2 mmol/L — ABNORMAL LOW (ref 3.5–5.1)
Sodium: 144 mmol/L (ref 135–145)

## 2022-06-25 LAB — PROTIME-INR
INR: 2 — ABNORMAL HIGH (ref 0.8–1.2)
Prothrombin Time: 22.7 seconds — ABNORMAL HIGH (ref 11.4–15.2)

## 2022-06-25 LAB — GLUCOSE, CAPILLARY
Glucose-Capillary: 243 mg/dL — ABNORMAL HIGH (ref 70–99)
Glucose-Capillary: 253 mg/dL — ABNORMAL HIGH (ref 70–99)
Glucose-Capillary: 378 mg/dL — ABNORMAL HIGH (ref 70–99)

## 2022-06-25 MED ORDER — GLYCOPYRROLATE 0.2 MG/ML IJ SOLN
0.4000 mg | INTRAMUSCULAR | Status: DC
  Administered 2022-06-25 (×3): 0.4 mg via INTRAVENOUS
  Filled 2022-06-25 (×3): qty 2

## 2022-06-25 MED ORDER — MORPHINE BOLUS VIA INFUSION: 4.0000 mg | INTRAVENOUS | Status: DC | PRN

## 2022-06-25 MED ORDER — ONDANSETRON 4 MG PO TBDP: 4.0000 mg | ORAL_TABLET | Freq: Four times a day (QID) | ORAL | Status: DC | PRN

## 2022-06-25 MED ORDER — POLYVINYL ALCOHOL 1.4 % OP SOLN: 1.0000 [drp] | Freq: Four times a day (QID) | OPHTHALMIC | Status: DC | PRN

## 2022-06-25 MED ORDER — BIOTENE DRY MOUTH MT LIQD
15.0000 mL | OROMUCOSAL | Status: DC | PRN
Start: 2022-06-25 — End: 2022-06-26

## 2022-06-25 MED ORDER — ACETAMINOPHEN 650 MG RE SUPP: 650.0000 mg | Freq: Four times a day (QID) | RECTAL | Status: DC | PRN

## 2022-06-25 MED ORDER — MORPHINE 100MG IN NS 100ML (1MG/ML) PREMIX INFUSION
4.0000 mg/h | INTRAVENOUS | Status: DC
  Administered 2022-06-25: 4 mg/h via INTRAVENOUS
  Administered 2022-06-25: 10 mg/h via INTRAVENOUS
  Filled 2022-06-25 (×2): qty 100

## 2022-06-25 MED ORDER — ACETAMINOPHEN 325 MG PO TABS: 650.0000 mg | ORAL_TABLET | Freq: Four times a day (QID) | ORAL | Status: DC | PRN

## 2022-06-25 MED ORDER — LORAZEPAM 2 MG/ML IJ SOLN: 0.5000 mg | INTRAMUSCULAR | Status: DC | PRN

## 2022-06-25 MED ORDER — BISACODYL 10 MG RE SUPP: 10.0000 mg | Freq: Every day | RECTAL | Status: DC | PRN

## 2022-06-25 MED ORDER — ONDANSETRON HCL 4 MG/2ML IJ SOLN: 4.0000 mg | Freq: Four times a day (QID) | INTRAMUSCULAR | Status: DC | PRN

## 2022-06-26 LAB — CULTURE, BLOOD (ROUTINE X 2)
Culture: NO GROWTH
Culture: NO GROWTH
Special Requests: ADEQUATE
Special Requests: ADEQUATE

## 2022-06-26 LAB — CULTURE, RESPIRATORY W GRAM STAIN

## 2022-07-01 NOTE — Progress Notes (Signed)
HOSPITAL MEDICINE OVERNIGHT EVENT NOTE    Notified by nursing that patient has unfortunately expired.  Patient unfortunately has been suffering from a prolonged hospital course due to viral meningitis complicated by severe encephalopathy.  As of late patient has been receiving comfort measures at the direction of family and has receiving palliative measures.  Patient is recently lost her pulse.  RNs have pronounced with time of death being 63.  Family is aware and at bedside.  Mortality services will be notified.  Vernelle Emerald  MD [facility]

## 2022-07-01 NOTE — Discharge Summary (Signed)
Death Summary  Valerie Cline XFG:[id number] DOB: 12-26-1955 DOA: 06/03/2022  PCP: Ginger Organ., MD  Admit date: 2022/06/03 Date of Death: 09-Jul-2022 Time of Death: 14-Jan-1999 Notification: Ginger Organ., MD notified of death of Jul 09, 2022   History of present illness:  Valerie Cline is a 66 y.o. female with a history of with non-Hodgkin's lymphoma on Rituxan (last dose in June 2023), essential hypertension, diabetes mellitus type 2, carotid stenosis, recent head foot and mouth disease transferred from [facility] for left upper extremity tremors at the facility neurological work-up was negative  EEG notes seizures, MRI of the brain showed a 13 mm left frontal artifact versus calcified meningioma, chronic small vessel disease cerebral atrophy and high-grade stenosis bilaterally in the ICAs, focal stenosis of the left ACA intracranial right vertebral artery, MRI of the C-spine shows C4-C5 compression from advanced disc disease.  She started developing fever and transferred to the ICU there was a concern for meningitis started on empirically on Vanco Rocephin, ampicillin and acyclovir LP was performed on 06/03/2021 that showed 25 white blood cells 2 red blood cells, CT AP showed bilateral groundglass opacities concerning for multifocal pneumonia.  PCCM was consulted as there was decreased responsive had hypoxia on a nonrebreather.  She started to decompensate slowly developing autonomic fevers and central diabetes insipidus palliative met with family and they decided to move towards comfort care on 2022/07/06.  Significant Events: 2023/06/04 Admitted to [facility] from [place]. Transferred to PCCM when found in respiratory failure 7/25 intubated, bronched, art line placed, PICC placed.  Neuro and ID consulted. Started decadron  7/26 decadron dc/d 7/27 felt most consistent with viral meningoencephalitis  7/29-7/30 sudden onset multiorgan failure; given tpa with improvement 8/4 trach 8/6 trach  bleeding 8/12 off sedation awaiting any neurologic response. 8/15 transitioned off CRRT to iHD 8/16 tolerating trach collar 8/20 trach exchange to cuffless trach 8/22 Fever > work up    Final Diagnoses:  Severe sepsis Acute infectious encephalopathy likely due to viral cephalitis: CSF was positive for enterovirus, she completed course of antiviral and Rx for bacterial meningitis. Neurology and Perative care discussed the poor prognosis with the family. Family decided to move towards comfort care she was placed on morphine drip and the patient passed away comfortably with family at bedside.  Focal convulsive status epilepticus: She was loaded with Vimpat and Keppra.  Cervical myelopathy status post ACDF C5 C7: Has completed steroids.   Acute kidney injury: With a baseline creatinine of around 0.8 started on CRRT on 05/29/2022, then HD on 06/15/2022. Urine output improved creatinine returned to baseline. Nephrology will sign off.   Hypokalemia: Likely due to Lasix challenge, repleted orally, now improved.   Acute respiratory failure with hypoxia and hypercapnia due to multifocal pneumonia: Status post tracheostomy. Completed course of meropenem. Continue trach care per PCCM, has been exchanged on 06/20/2022.   Acute pulmonary emboli: CT angio of the chest on 06/04/2022 showed possible pulmonary emboli. Can consider discontinuing anticoagulation.   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   Diabetes mellitus type 2: With an unknown A1c, currently on long-acting insulin plus sliding scale, her blood glucose has risen likely due to D5.   Newly diagnosed central diabetes insipidus: Negative about 8 L with strong urine output, serum osmolarity was 376, urine was 377.   The results of significant diagnostics from this hospitalization (including  imaging, microbiology, ancillary  and laboratory) are listed below for reference.    Significant Diagnostic Studies: DG CHEST PORT 1 VIEW  Result Date: 06/21/2022 CLINICAL DATA:  Fever with respiratory failure EXAM: PORTABLE CHEST 1 VIEW COMPARISON:  Chest 06/17/2022 FINDINGS: Tracheostomy remains in good position. Left arm PICC tip in the SVC. Right jugular catheter removed Improved aeration lungs. Improvement in bibasilar atelectasis. Improvement in bilateral edema. Small right effusion unchanged. IMPRESSION: Improved aeration with decrease in bilateral edema and bibasilar atelectasis. Small right effusion. Electronically Signed   By: Franchot Gallo M.D.   On: 06/21/2022 13:50   DG CHEST PORT 1 VIEW  Result Date: 06/17/2022 CLINICAL DATA:  Respiratory failure EXAM: PORTABLE CHEST 1 VIEW COMPARISON:  06/03/2022 FINDINGS: No significant change in low volume AP portable chest radiograph. Unchanged support apparatus including tracheostomy and right neck large bore multi lumen vascular catheter. Diffuse bilateral interstitial pulmonary opacity and atelectasis or consolidation of the right lung base with a probable small right pleural effusion. No new or focal airspace opacity. IMPRESSION: 1. No significant change in low volume AP portable chest radiograph. 2. Diffuse bilateral interstitial pulmonary opacity and atelectasis or consolidation of the right lung base with a probable small right pleural effusion, findings most likely reflecting edema. Electronically Signed   By: Delanna Ahmadi M.D.   On: 06/17/2022 12:47   Overnight EEG with video  Result Date: 06/17/2022 Lora Havens, MD     06/18/2022  9:12 AM MRN: [id number] Epilepsy Attending: Lora Havens Referring Physician/Provider: Lora Havens, MD Duration: 06/16/2022 1957 to 06/17/2022 1957  Patient history: 65yo F with ams. EEG to evaluate for seizure  Level of alertness: lethargic, asleep  AEDs during EEG study: Keppra, Vimpat  Technical  aspects: This EEG study was done with scalp electrodes positioned according to the 10-20 International system of electrode placement. Electrical activity was acquired at a sampling rate of '500Hz'  and reviewed with a high frequency filter of '70Hz'  and a low frequency filter of '1Hz' . EEG data were recorded continuously and digitally stored.  Description: During awake state, no clear posterior dominant rhythm was seen.  Sleep was characterized by sleep spindles (12 to 14 Hz), maximal frontocentral region.  EEG showed continuous generalized 5 to 9 Hz theta-alpha activity admixed with 12 to 14 Hz generalized beta activity.  Continuous 3 to 5 Hz theta-delta slowing was also noted in left posterior quadrant. Polyspikes were noted in left posterior quadrant which at times appeared quasiperiodic  at 1 Hz.  ABNORMALITY -Polyspikes, left posterior quadrant -Continuous slow, generalized and maximal left posterior quadrant  IMPRESSION: This study showed evidence of epileptogenicity and cortical dysfunction in left posterior quadrant likely due to underlying structural abnormality. Additionally there was moderate to severe diffuse encephalopathy, nonspecific etiology. No definite seizures were seen during this study. Lora Havens   MR BRAIN WO CONTRAST  Result Date: 06/16/2022 CLINICAL DATA:  Meningitis. EXAM: MRI HEAD WITHOUT CONTRAST TECHNIQUE: Multiplanar, multiecho pulse sequences of the brain and surrounding structures were obtained without intravenous contrast. COMPARISON:  CT head 06/10/2022.  MRI 06/06/2022 FINDINGS: Brain: Interval improvement in hyperintensity in the sulci on FLAIR bilaterally. Mild residual over the convexity. No fluid collection identified. No edema within the brain. No acute infarct or abscess identified. Mild chronic microvascular ischemic change in the white matter. Negative for hemorrhage or mass. No midline shift. Vascular: Normal arterial flow voids Skull and upper cervical spine: No focal  skeletal lesion. Sinuses/Orbits: Mild mucosal edema paranasal sinuses with improvement. Bilateral mastoid effusion  with improvement. Negative orbit Other: None IMPRESSION: 1. Interval improvement in hyperintensity in the sulci of both cerebral hemispheres. This finding can be seen with meningitis. No fluid collection or abscess or brain edema identified 2. Improving mucosal edema in the paranasal sinuses and mastoid sinus bilaterally. Electronically Signed   By: Franchot Gallo M.D.   On: 06/16/2022 13:02   CT HEAD WO CONTRAST (5MM)  Result Date: 06/10/2022 CLINICAL DATA:  Delirium, meningitis, hydrocephalus, strokes EXAM: CT HEAD WITHOUT CONTRAST TECHNIQUE: Contiguous axial images were obtained from the base of the skull through the vertex without intravenous contrast. RADIATION DOSE REDUCTION: This exam was performed according to the departmental dose-optimization program which includes automated exposure control, adjustment of the mA and/or kV according to patient size and/or use of iterative reconstruction technique. COMPARISON:  CT head 06/01/2022 correlation is also made with 06/06/2022 MRI head FINDINGS: Brain: Again noted is poor visualization of the sulci, particularly in the parietal and occipital lobes. Overall unchanged ventricular size compared to 06/01/2022 although they are larger compared to 05/20/2022. No evidence of acute infarction, hemorrhage, mass, mass effect, or midline shift. No definite extra-axial collection. Vascular: No hyperdense vessel. Atherosclerotic calcifications in the intracranial carotid and vertebral arteries. Skull: Normal. Negative for fracture or focal lesion. Sinuses/Orbits: Mucous retention cyst in the right maxillary sinus. Otherwise clear paranasal sinuses. The orbits are unremarkable. Other: Fluid in the right-greater-than-left mastoid air cells. IMPRESSION: 1. Poor visualization of the parietal and occipital sulci, similar to the prior exam, which is consistent with  the abnormal CSF signal and suspected meningitis seen on the 06/06/2022 MRI, although not as extensive. No definite extra-axial collection. 2. Overall unchanged ventricular size compared to 06/01/2022, although these have increased in size compared to 05/20/2022 and remain concerning for communicating hydrocephalus. Electronically Signed   By: Merilyn Baba M.D.   On: 06/10/2022 14:50   IR GASTROSTOMY TUBE MOD SED  Result Date: 06/08/2022 INDICATION: Meningitis, encephalopathy EXAM: FLUOROSCOPIC 20 FRENCH PULL-THROUGH GASTROSTOMY Date:  06/08/2022 06/08/2022 11:29 am Radiologist:  M. Daryll Brod, MD Guidance:  Fluoroscopic MEDICATIONS: Patient is already receiving IV antibiotics as an inpatient glucagon 0.5 mg IV ANESTHESIA/SEDATION: Moderate Sedation Time:  None. The patient was continuously monitored during the procedure by the interventional radiology nurse under my direct supervision. CONTRAST:  20 cc omni 300-administered into the gastric lumen. FLUOROSCOPY: Fluoroscopy Time: 1 minutes 42 seconds (52 mGy). COMPLICATIONS: None immediate. PROCEDURE: Informed consent was obtained from the patient following explanation of the procedure, risks, benefits and alternatives. The patient understands, agrees and consents for the procedure. All questions were addressed. A time out was performed. Maximal barrier sterile technique utilized including caps, mask, sterile gowns, sterile gloves, large sterile drape, hand hygiene, and betadine prep. The left upper quadrant was sterilely prepped and draped. An oral gastric catheter was inserted into the stomach under fluoroscopy. The existing nasogastric feeding tube was removed. Air was injected into the stomach for insufflation and visualization under fluoroscopy. The air distended stomach was confirmed beneath the anterior abdominal wall in the frontal and lateral projections. Under sterile conditions and local anesthesia, a 49 gauge trocar needle was utilized to access the  stomach percutaneously beneath the left subcostal margin. Needle position was confirmed within the stomach under biplane fluoroscopy. Contrast injection confirmed position also. A single T tack was deployed for gastropexy. Over an Amplatz guide wire, a 9-French sheath was inserted into the stomach. A snare device was utilized to capture the oral gastric catheter. The snare device was pulled  retrograde from the stomach up the esophagus and out the oropharynx. The 20-French pull-through gastrostomy was connected to the snare device and pulled antegrade through the oropharynx down the esophagus into the stomach and then through the percutaneous tract external to the patient. The gastrostomy was assembled externally. Contrast injection confirms position in the stomach. Images were obtained for documentation. The patient tolerated procedure well. No immediate complication. IMPRESSION: Fluoroscopic insertion of a 20-French "pull-through" gastrostomy. Electronically Signed   By: Jerilynn Mages.  Shick M.D.   On: 06/08/2022 11:38   MR BRAIN WO CONTRAST  Result Date: 06/06/2022 CLINICAL DATA:  Meningitis/CNS infection suspected EXAM: MRI HEAD WITHOUT CONTRAST TECHNIQUE: Multiplanar, multiecho pulse sequences of the brain and surrounding structures were obtained without intravenous contrast. COMPARISON:  05/25/2022 FINDINGS: Brain: Persistent abnormal focal T2 FLAIR hyperintensity. This has progressed since the prior study. Ventricle caliber is mildly increased and could reflect communicating hydrocephalus. There are patchy foci of cortical/subcortical diffusion hyperintensity particularly in the left parietal, posterior temporal, and occipital lobes. Patchy foci of T2 hyperintensity in the supratentorial white matter may reflect chronic microvascular ischemic changes. Left vertex dural-based lesion on the prior study is likely similar though not as well evaluated on this noncontrast study. No mass effect.  No extra-axial collection.  Vascular: Major vessel flow voids at the skull base are preserved. Skull and upper cervical spine: Normal marrow signal is preserved. Sinuses/Orbits: Minor mucosal thickening.  Orbits are unremarkable. Other: Sella is unremarkable. Nonspecific mastoid effusions. Retained secretions in the included pharynx. IMPRESSION: Persistent findings of meningitis. Extent of abnormal sulcal signal has progressed. Mildly increase in caliber of ventricles may reflect communicating hydrocephalus. Patchy foci of abnormal cortical/subcortical diffusion may reflect acute ischemia, seizure related changes, and/or encephalitis. Electronically Signed   By: Macy Mis M.D.   On: 06/06/2022 15:16   CT CHEST ABDOMEN PELVIS W CONTRAST  Result Date: 06/04/2022 CLINICAL DATA:  Abdominal pain. EXAM: CT CHEST, ABDOMEN, AND PELVIS WITH CONTRAST TECHNIQUE: Multidetector CT imaging of the chest, abdomen and pelvis was performed following the standard protocol during bolus administration of intravenous contrast. RADIATION DOSE REDUCTION: This exam was performed according to the departmental dose-optimization program which includes automated exposure control, adjustment of the mA and/or kV according to patient size and/or use of iterative reconstruction technique. CONTRAST:  165m OMNIPAQUE IOHEXOL 300 MG/ML  SOLN COMPARISON:  09/03/2020. Chest radiograph an abdomen radiographs, 06/03/2022. FINDINGS: CT CHEST FINDINGS Cardiovascular: Heart normal in size. No pericardial effusion. Three-vessel coronary artery calcifications. Great vessels are normal in caliber. Aortic atherosclerosis. No dissection. Vague filling defect in the peripheral right pulmonary artery extending to the right lower lobe pulmonary artery and segmental branches. This could reflect pulmonary emboli, but is equivocal Mediastinum/Nodes: Stable, previously assessed, left thyroid gland nodule/enlargement. No mediastinal or hilar masses. No enlarged lymph nodes. Endotracheal  tube tip projects 4.7 cm above the carina. Nasal/orogastric tube tip lies in the distal stomach. Lungs/Pleura: Patchy right lower lobe consolidation. Mild associated bronchiectasis. Milder opacity in the dependent posteromedial left lower lobe, consistent with atelectasis. There is atelectasis or scarring in dependent right middle lobe and left upper lobe lingula, with associated bronchiectasis. There are additional bilateral areas of ground-glass opacity as well as linear opacities. These findings are new compared to the prior chest CT from 09/03/2020. Trace, right greater than left, pleural effusions. No pneumothorax. Musculoskeletal: No fracture or acute finding.  No bone lesion. CT ABDOMEN PELVIS FINDINGS Hepatobiliary: Normal liver. Small dependent gallstone. No acute cholecystitis. No bile duct dilation. Pancreas: Unremarkable.  No pancreatic ductal dilatation or surrounding inflammatory changes. Spleen: Normal in size without focal abnormality. Adrenals/Urinary Tract: No adrenal masses. Kidneys normal in overall size, orientation and position. No renal mass or stone. No hydronephrosis. Normal ureters. Bladder decompressed with a Foley catheter. Stomach/Bowel: Normal stomach. Decompressed small bowel. Scattered colonic air-fluid levels. No bowel wall thickening. No inflammation. Rectal tube in place. Vascular/Lymphatic: Aortic atherosclerosis. No aneurysm. No enlarged lymph nodes. Reproductive: Uterus and bilateral adnexa are unremarkable. Other: Small amount of ascites. Subcutaneous edema along the lower abdomen and pelvis. Musculoskeletal: Chronic bilateral pars defects at L5-S1 with a grade 2 to grade 3 anterolysis. No acute fracture. Status post posterior fusion, L1, L2 and L3. This is new compared to the prior CT. No bone lesion. IMPRESSION: 1. Possible pulmonary emboli involving the right peripheral pulmonary artery/right lower lobe pulmonary arteries. Consider follow-up CTA chest for further assessment  if this patient can tolerate a repeat contrast dose. 2. Bilateral lung opacities, most confluent in the right lower lobe, suspected to be multifocal pneumonia. 3. Trace bilateral pleural effusions. 4. Aortic atherosclerosis and 3 vessel coronary artery calcifications. 5. Small amount of ascites. 6. No acute abnormality within the abdomen or pelvis. Electronically Signed   By: Lajean Manes M.D.   On: 06/04/2022 10:28   DG Abd Portable 1V  Result Date: 06/03/2022 CLINICAL DATA:  NGT EXAM: PORTABLE ABDOMEN - 1 VIEW COMPARISON:  None Available. FINDINGS: Nonobstructive pattern of included bowel gas. Non weighted enteric feeding tube is positioned with tip below the diaphragm, in the vicinity of the pylorus or duodenal bulb. IMPRESSION: Non weighted enteric feeding tube is positioned with tip below the diaphragm, in the vicinity of the pylorus or duodenal bulb. Consider advancement if post pyloric placement is desired. Electronically Signed   By: Delanna Ahmadi M.D.   On: 06/03/2022 15:26   DG Chest Port 1 View  Result Date: 06/03/2022 CLINICAL DATA:  Status post tracheostomy. EXAM: PORTABLE CHEST 1 VIEW COMPARISON:  05/29/2022 FINDINGS: Removal of endotracheal tube with placement of tracheostomy since the prior chest x-ray. Tracheostomy appearance by x-ray appears appropriate. Non tunneled large bore central venous catheter remains in place with the catheter tip in the SVC. Improved aeration of the right lung with some residual atelectasis or infiltrate remaining at the right lung base. No pulmonary edema or pleural fluid identified. No pneumothorax. IMPRESSION: Tracheostomy appears appropriate by chest x-ray. Improved aeration of the right lung with residual atelectasis or infiltrate at the right lung base. Electronically Signed   By: Aletta Edouard M.D.   On: 06/03/2022 15:23   CT HEAD WO CONTRAST (5MM)  Result Date: 06/01/2022 CLINICAL DATA:  Meningitis/CNS infection suspected EXAM: CT HEAD WITHOUT CONTRAST  TECHNIQUE: Contiguous axial images were obtained from the base of the skull through the vertex without intravenous contrast. RADIATION DOSE REDUCTION: This exam was performed according to the departmental dose-optimization program which includes automated exposure control, adjustment of the mA and/or kV according to patient size and/or use of iterative reconstruction technique. COMPARISON:  05/29/2022 CT head, 05/25/2022 MRI, CT head 05/20/2022 FINDINGS: Brain: Again noted is poor visualization of the sulci in the parietal and occipital lobes. No evidence of acute infarction, hemorrhage, mass, mass effect, or midline shift. No hydrocephalus or definite extra-axial fluid collection. Vascular: No hyperdense vessel. Atherosclerotic calcifications in the intracranial carotid and vertebral arteries. Skull: Normal. Negative for fracture or focal lesion. Sinuses/Orbits: No acute finding. Other: Fluid in the right-greater-than-left left mastoid air cells. IMPRESSION: Poor visualization of the  parietal and occipital sulci, similar to 05/29/2022, which is consistent with the abnormal CSF signal and suspected meningitis seen on the 05/25/2022 MRI, although no discrete extra-axial collection is seen. Electronically Signed   By: Merilyn Baba M.D.   On: 06/01/2022 13:12   Overnight EEG with video  Result Date: 05/31/2022 Lora Havens, MD     05/31/2022  2:52 PM Patient Name: Valerie Cline MRN: [id number] Epilepsy Attending: Lora Havens Referring Physician/Provider: Greta Doom, MD  Duration: 05/30/2022 1014 to 05/31/2022 1014  Patient history: 66yo F with ams. EEG to evaluate for seizure  Level of alertness: lethargic, asleep  AEDs during EEG study: propofol, Keppra, Vimpat  Technical aspects: This EEG study was done with scalp electrodes positioned according to the 10-20 International system of electrode placement. Electrical activity was acquired at a sampling rate of '500Hz'  and reviewed with a high frequency  filter of '70Hz'  and a low frequency filter of '1Hz' . EEG data were recorded continuously and digitally stored.  Description: At the beginning of the study, EEG showed seizures arising from left posterior quadrant. During the seizure, initially patient had no clinical signs.  She then had sudden brief eye-opening.  Concomitant EEG showed spike and wave in left posterior quadrant which then involved all of left hemisphere and right posterior quadrant. Nine seizures were noted between 1015 to 1500 on 05/30/2022.  After 1500, frequency of seizures gradually worsened.  IV Vimpat was added.  However, EEG continued to worsen and showed status epilepticus.  At times patient was noted to have eye-opening and full body jerking but during most of the EEG, patient did not have any clinical signs.  IV propofol was added without resolution of status epilepticus. ABNORMALITY -Convulsive status epilepticus, left posterior quadrant  IMPRESSION: This study showed status epilepticus arising from left posterior quadrant.  During most of the EEG, no clinical signs were seen.  However at times patient was noted to have eye-opening and whole body jerking. Valerie Cline   CT HEAD WO CONTRAST (5MM)  Result Date: 05/29/2022 CLINICAL DATA:  Mental status changes. Persistent or worsening. Suspected viral meningitis. EXAM: CT HEAD WITHOUT CONTRAST TECHNIQUE: Contiguous axial images were obtained from the base of the skull through the vertex without intravenous contrast. RADIATION DOSE REDUCTION: This exam was performed according to the departmental dose-optimization program which includes automated exposure control, adjustment of the mA and/or kV according to patient size and/or use of iterative reconstruction technique. COMPARISON:  CT head without contrast 05/20/2022. MR head without and with contrast 05/25/2022 FINDINGS: Brain: Sulci are poorly seen over the posterior parietal lobes and occipital lobes. Mild atrophy and white matter  changes are present. Ventricles are stable in size. No discrete extra-axial fluid collections are present. The brainstem and cerebellum are within normal limits. Vascular: Atherosclerotic calcifications are again seen within the cavernous internal carotid arteries. No hyperdense vessel present. Skull: Calvarium is intact. No focal lytic or blastic lesions are present. No significant extracranial soft tissue lesion is present. Sinuses/Orbits: Right greater than left mastoid effusions are new. No significant middle ear effusions are present. The paranasal sinuses and mastoid air cells are otherwise clear. The globes and orbits are within normal limits. IMPRESSION: 1. Sulci are poorly seen over the posterior parietal lobes and occipital lobes. This is consistent with abnormal CSF signal and suspected meningitis. 2. No discrete extra-axial fluid collections. 3. Stable atrophy and white matter disease. 4. New right greater than left mastoid effusions. Electronically Signed   By:  San Morelle M.D.   On: 05/29/2022 17:33    Microbiology: Recent Results (from the past 240 hour(s))  Culture, Respiratory w Gram Stain     Status: None   Collection Time: 06/21/22 11:54 AM   Specimen: Tracheal Aspirate; Respiratory  Result Value Ref Range Status   Specimen Description TRACHEAL ASPIRATE  Final   Special Requests NONE  Final   Gram Stain   Final    ABUNDANT SQUAMOUS EPITHELIAL CELLS PRESENT ABUNDANT WBC PRESENT, PREDOMINANTLY PMN FEW GRAM POSITIVE COCCI IN PAIRS RARE GRAM NEGATIVE RODS Performed at [facility], [place] [address], [place], [place] [address]    Culture MODERATE STREPTOCOCCUS ANGINOSIS  Final   Report Status 06/26/2022 FINAL  Final   Organism ID, Bacteria STREPTOCOCCUS ANGINOSIS  Final      Susceptibility   Streptococcus anginosis - MIC*    PENICILLIN <=0.06 SENSITIVE Sensitive     CEFTRIAXONE 0.25 SENSITIVE Sensitive     ERYTHROMYCIN 4 RESISTANT Resistant     LEVOFLOXACIN <=0.25  SENSITIVE Sensitive     VANCOMYCIN 0.5 SENSITIVE Sensitive     * MODERATE STREPTOCOCCUS ANGINOSIS  Remove and replace urinary cath (placed > 5 days) then obtain urine culture from new indwelling urinary catheter.     Status: None   Collection Time: 06/21/22 12:29 PM   Specimen: Urine, Catheterized  Result Value Ref Range Status   Specimen Description URINE, CATHETERIZED  Final   Special Requests NONE  Final   Culture   Final    NO GROWTH Performed at [facility], [address], [place], [place] [address]    Report Status 06/22/2022 FINAL  Final  Culture, blood (Routine X 2) w Reflex to ID Panel     Status: None   Collection Time: 06/21/22 12:31 PM   Specimen: BLOOD  Result Value Ref Range Status   Specimen Description BLOOD SITE NOT SPECIFIED  Final   Special Requests   Final    BOTTLES DRAWN AEROBIC AND ANAEROBIC Blood Culture adequate volume   Culture   Final    NO GROWTH 5 DAYS Performed at [facility], [place] [address] [place], [place] [address]    Report Status 06/26/2022 FINAL  Final  Culture, blood (Routine X 2) w Reflex to ID Panel     Status: None   Collection Time: 06/21/22 12:34 PM   Specimen: BLOOD RIGHT HAND  Result Value Ref Range Status   Specimen Description BLOOD RIGHT HAND  Final   Special Requests AEROBIC BOTTLE ONLY Blood Culture adequate volume  Final   Culture   Final    NO GROWTH 5 DAYS Performed at [facility], [place] [address], [place], [place] [address]    Report Status 06/26/2022 FINAL  Final     Labs: Basic Metabolic Panel: Recent Labs  Lab 06/22/22 0330 06/23/22 0338 06/23/22 1651 06/24/22 0445 06/24/22 1705 07-19-2022 0444  NA 148* 153* 146* 150* 150* 144  K 3.0* 3.9 4.2 3.8 3.6 3.2*  CL 102 107 101 105 106 102  CO2 35* 36* 35* 35* 35* 33*  GLUCOSE 209* 175* 422* 259* 196* 270*  BUN 103* 108* 108* 110* 103* 103*  CREATININE 1.09* 1.06* 0.98 0.95 0.90 0.90  CALCIUM 8.6* 8.8* 8.5* 8.6* 8.5* 8.2*  MG 1.7 1.9  --   --    --   --    Liver Function Tests: No results for input(s): "AST", "ALT", "ALKPHOS", "BILITOT", "PROT", "ALBUMIN" in the last  168 hours. No results for input(s): "LIPASE", "AMYLASE" in the last 168 hours. No results for input(s): "AMMONIA" in the last 168 hours. CBC: Recent Labs  Lab 06/22/22 0330 06/23/22 0338 06/24/22 0445  WBC 5.5 6.9 6.3  HGB 8.3* 8.7* 8.0*  HCT 27.8* 30.3* 27.7*  MCV 89.1 91.0 91.1  PLT 306 382 342   Cardiac Enzymes: No results for input(s): "CKTOTAL", "CKMB", "CKMBINDEX", "TROPONINI" in the last 168 hours. D-Dimer No results for input(s): "DDIMER" in the last 72 hours. BNP: Invalid input(s): "POCBNP" CBG: Recent Labs  Lab 06/24/22 1536 06/24/22 1945 07/01/2022 0005 07/01/2022 0449 07-01-2022 0742  GLUCAP 213* 189* 253* 243* 378*   Anemia work up No results for input(s): "VITAMINB12", "FOLATE", "FERRITIN", "TIBC", "IRON", "RETICCTPCT" in the last 72 hours. Urinalysis    Component Value Date/Time   COLORURINE STRAW (A) 06/21/2022 1229   APPEARANCEUR CLEAR 06/21/2022 1229   LABSPEC 1.008 06/21/2022 1229   PHURINE 5.0 06/21/2022 1229   GLUCOSEU NEGATIVE 06/21/2022 1229   HGBUR SMALL (A) 06/21/2022 1229   BILIRUBINUR NEGATIVE 06/21/2022 1229   KETONESUR NEGATIVE 06/21/2022 1229   PROTEINUR NEGATIVE 06/21/2022 1229   NITRITE NEGATIVE 06/21/2022 1229   LEUKOCYTESUR TRACE (A) 06/21/2022 1229   Sepsis Labs Recent Labs  Lab 06/22/22 0330 06/23/22 0338 06/24/22 0445  WBC 5.5 6.9 6.3       SIGNED:  Charlynne Cousins, MD  [facility] 06/28/2022, 12:25 PM Pager   If 7PM-7AM, please contact night-coverage [email or web address] Password [email or web address]

## 2022-07-01 NOTE — Progress Notes (Addendum)
Palliative Medicine Inpatient Follow Up Note HPI: Valerie Cline is an 66 y.o. female with non-Hodgkin's lymphoma on Rituxan (last dose in June 2023), essential hypertension, diabetes mellitus type 2, carotid stenosis, recent head foot and mouth disease transferred from [facility] for left upper extremity tremors at the facility neurological work-up and treatment for suspected viral meningitis. Has had a complicated thirty day hospitalization whereby she has been trached. As of presently no great neurological recovery has been identified. Palliative care has been asked to start goals of care conversations.   Today's Discussion June 30, 2022  *Please note that this is a verbal dictation therefore any spelling or grammatical errors are due to the "[employer]" system interpretation.  Chart reviewed inclusive of vital signs, progress notes, laboratory results, and diagnostic images.   I met with patients husband, Valerie Cline and her daughter, Valerie Cline this morning.  We discussed the plan for transition to comfort care at 10 AM.  We reviewed that family will be here throughout the duration.  We reviewed that a low-dose morphine drip would be initiated to help with symptom burden and as patient is removed from oxygen.  We discussed that it is unclear in regards to a timeframe of how short or long Valerie Cline will be with [place] though our primary goal will be to keep her comfortable and allow her to be brought to a place of peace.  I shared that the palliative care team will be available if any additional support is needed  Questions and concerns addressed.  I spoke to patient's nurse, Anderson Malta regarding the plan.  Comfort care orders have been entered in anticipation of 10 AM.  I have called the front desk to alert them that family members will be visiting throughout the course of the day. __________________________ Addendum:  I met with the Valerie Cline family this late afternoon.  Provided support through  therapeutic listening.  Shared with the family what to expect moving forward and offered condolences.  Additional time: 15 minutes  Objective Assessment: Vital Signs Vitals:   2022/06/30 0726 June 30, 2022 0740  BP:    Pulse: 96   Resp:    Temp:  (!) 100.6 F (38.1 C)  SpO2: 93%     Intake/Output Summary (Last 24 hours) at 2022/06/30 1275 Last data filed at 2022/06/30 0301 Gross per 24 hour  Intake 2397.92 ml  Output 1360 ml  Net 1037.92 ml    Last Weight  Most recent update: 06/23/2022  6:48 AM    Weight  68.4 kg (150 lb 12.7 oz)            Gen: Elderly F in NAD HEENT: Tracheostomy, Dry mucous membranes CV: Irregular rate and rhythm PULM: Trach collar ABD: soft/nontender, (+) Gtube EXT: Gen. edema Neuro: Somnolent  SUMMARY OF RECOMMENDATIONS   DNAR   Transition to comfort mediated care at 10 AM  Low-dose morphine drip has been ordered with bolus doses  Additional comfort medications per [facility]  Unrestricted family visitation  Ongoing support will be provided given this very difficult situation  Billing based on MDM: High  Problems Addressed: One acute or chronic illness or injury that poses a threat to life or bodily function  Amount and/or Complexity of Data: Category 3:Discussion of management or test interpretation with external physician/other qualified health care professional/appropriate source (not separately reported)  Risks: Decision not to resuscitate or to de-escalate care because of poor prognosis ______________________________________________________________________________________ [place] Team Team Cell Phone: [phone] Please utilize  secure chat with additional questions, if there is no response within 30 minutes please call the above phone number  Palliative Medicine Team providers are available by phone from 7am to 7pm daily and can be reached through the team cell phone.  Should this patient require  assistance outside of these hours, please call the patient's attending physician.

## 2022-07-01 NOTE — Progress Notes (Signed)
[facility] PROGRESS NOTE    Progress Note  Valerie Cline  HMC:[id number] DOB: Jun 20, 1956 DOA: 05/14/2022 PCP: Ginger Organ., MD     Brief Narrative:   Valerie Cline is an 66 y.o. female with non-Hodgkin's lymphoma on Rituxan (last dose in June 2023), essential hypertension, diabetes mellitus type 2, carotid stenosis, recent head foot and mouth disease transferred from [facility] for left upper extremity tremors at the facility neurological work-up was negative  EEG notes seizures, MRI of the brain showed a 13 mm left frontal artifact versus calcified meningioma, chronic small vessel disease cerebral atrophy and high-grade stenosis bilaterally in the ICAs, focal stenosis of the left ACA intracranial right vertebral artery, MRI of the C-spine shows C4-C5 compression from advanced disc disease.  She started developing fever and transferred to the ICU there was a concern for meningitis started on empirically on Vanco Rocephin, ampicillin and acyclovir LP was performed on 05/23/2021 that showed 80 white blood cells 2 red blood cells, CT AP showed bilateral groundglass opacities concerning for multifocal pneumonia.  PCCM was consulted as there was decreased responsive had hypoxia on a nonrebreather.  She started to decompensate slowly developing autonomic fevers and central diabetes insipidus palliative met with family and they decided to move towards comfort care on 2022/07/13.    Significant Events: 7/24 Admitted to [facility] from [facility]. Transferred to PCCM when found in respiratory failure 7/25 intubated, bronched, art line placed, PICC placed.  Neuro and ID consulted. Started decadron  7/26 decadron dc/d 7/27 felt most consistent with viral meningoencephalitis  7/29-7/30 sudden onset multiorgan failure; given tpa with improvement 8/4 trach 8/6 trach bleeding 8/12 off sedation awaiting any neurologic response. 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 Completed course of antiviral therapy and bacterial meningitis antibiotic. Neurology and palliative Care discussed with family and the family decided to move towards comfort measures. She would be a good candidate for morphine drip.   Focal convulsive status epilepticus: Now resolved. We will continue Vimpat and Keppra for the seizure prophylaxis.  Cervical myelopathy status post ACDF C5 C7: Has completed steroids.  Acute kidney injury: With a baseline creatinine of around 0.8 started on CRRT on 05/29/2022, then HD on 06/15/2022. Nephrology will sign off.  Hypokalemia: Likely due to Lasix challenge, repleted orally, now improved.  Acute respiratory failure with hypoxia and hypercapnia due to multifocal pneumonia: Status post tracheostomy. Completed course of meropenem. Continue trach care per PCCM, has been exchanged on 06/20/2022.  Acute pulmonary emboli: CT angio of the chest on 06/04/2022 showed possible pulmonary emboli. Can consider discontinuing anticoagulation.  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  Diabetes mellitus type 2: With an unknown A1c, currently on long-acting insulin plus sliding scale, her blood glucose has risen likely due to D5.  Newly diagnosed central diabetes insipidus: Negative about 8 L with strong urine output, serum osmolarity was 376, urine was 377.  Sudden onset of shock: Now off pressors.  Dysphagia: Status post PEG tube, currently in tube feeding start free water per tube.  Fever of unclear source: Tmax  of 101 over  48 hours ago, she was started on Tylenol, has no leukocytosis.  Stage II sacral decubitus ulcer RN Pressure Injury Documentation: Pressure Injury 06/01/22 Sacrum Mid;Lower Stage 2 -  Partial thickness loss of dermis presenting as a shallow open injury with a red, pink wound bed without slough. pink wound bed size of penny with red broken skin in middle (Active)  06/01/22 1200  Location: Sacrum  Location Orientation: Mid;Lower  Staging: Stage 2 -  Partial thickness loss of dermis presenting as a shallow open injury with a red, pink wound bed without slough.  Wound Description (Comments): pink wound bed size of penny with red broken skin in middle  Present on Admission:   Dressing Type Foam - Lift dressing to assess site every shift 06/24/22 2000    Estimated body mass index is 24.35 kg/m as calculated from the following:   Height as of this encounter: 5' 5.98" (1.676 m).   Weight as of this encounter: 68.4 kg.   DVT prophylaxis: lovenox Family Communication:Daughter Status is: Inpatient Remains inpatient appropriate because: Severe neurological damage      Code Status:     Code Status Orders  (From admission, onward)           Start     Ordered   05/24/22 0007  Full code  Continuous        05/24/22 0010           Code Status History     Date Active Date Inactive Code Status Order ID Comments User Context   12/08/2021 1444 12/09/2021 1509 Full Code [id number]  Eustace Moore, MD Inpatient   11/08/2019 2028 11/11/2019 1652 Full Code [id number]  Gwynne Edinger, MD ED   11/08/2019 2027 11/08/2019 2028 Full Code [id number]  Wouk, Ailene Rud, MD ED         IV Access:   Peripheral IV   Procedures and diagnostic studies:   No results found.   Medical Consultants:   None.   Subjective:    Valerie Cline nonverbal  Objective:    Vitals:   07/16/2022 0500 Jul 16, 2022 0600 07-16-2022 0726 07-16-22 0740  BP: (!) 128/56 (!) 150/58    Pulse: 99 94 96    Resp: (!) 31 20    Temp:    (!) 100.6 F (38.1 C)  TempSrc:    Axillary  SpO2: 94% 92% 93%   Weight:      Height:       SpO2: 93 % O2 Flow Rate (L/min): 5 L/min FiO2 (%): 28 %   Intake/Output Summary (Last 24 hours) at 2022/07/16 0809 Last data filed at 07/16/22 0301 Gross per 24 hour  Intake 2467.93 ml  Output 1360 ml  Net 1107.93 ml    Filed Weights   06/18/22 0600 06/19/22 0600 06/23/22 0400  Weight: 68.9 kg 68.6 kg 68.4 kg    Exam: General exam: In no acute distress. Respiratory system: Good air movement and clear to auscultation. Cardiovascular system: S1 & S2 heard, RRR. No JVD. Gastrointestinal system: Abdomen is nondistended, soft and nontender.  Extremities: No pedal edema. Skin: No rashes, lesions or ulcers Psychiatry: Judgement and insight appear normal. Mood & affect appropriate.  Data Reviewed:    Labs: Basic Metabolic Panel: Recent Labs  Lab 06/22/22 0330 06/23/22 0338 06/23/22 1651 06/24/22 0445 06/24/22 1705 16-Jul-2022 0444  NA 148* 153* 146* 150* 150* 144  K 3.0* 3.9 4.2 3.8 3.6 3.2*  CL 102 107 101 105 106 102  CO2  35* 36* 35* 35* 35* 33*  GLUCOSE 209* 175* 422* 259* 196* 270*  BUN 103* 108* 108* 110* 103* 103*  CREATININE 1.09* 1.06* 0.98 0.95 0.90 0.90  CALCIUM 8.6* 8.8* 8.5* 8.6* 8.5* 8.2*  MG 1.7 1.9  --   --   --   --     GFR Estimated Creatinine Clearance: 56.8 mL/min (by C-G formula based on SCr of 0.9 mg/dL). Liver Function Tests: No results for input(s): "AST", "ALT", "ALKPHOS", "BILITOT", "PROT", "ALBUMIN" in the last 168 hours.  No results for input(s): "LIPASE", "AMYLASE" in the last 168 hours. No results for input(s): "AMMONIA" in the last 168 hours.  Coagulation profile Recent Labs  Lab 06/21/22 0319 06/22/22 0330 06/23/22 0338 06/24/22 0445 Jul 13, 2022 0444  INR 1.7* 1.7* 1.9* 1.9* 2.0*    COVID-19 Labs  No results for input(s): "DDIMER", "FERRITIN", "LDH", "CRP" in the last 72 hours.  Lab Results   Component Value Date   SARSCOV2NAA POSITIVE (A) 11/16/2021    CBC: Recent Labs  Lab 06/20/22 0610 06/21/22 0319 06/22/22 0330 06/23/22 0338 06/24/22 0445  WBC 4.7 4.4 5.5 6.9 6.3  HGB 7.7* 8.0* 8.3* 8.7* 8.0*  HCT 25.4* 26.2* 27.8* 30.3* 27.7*  MCV 87.9 86.8 89.1 91.0 91.1  PLT 190 244 306 382 342    Cardiac Enzymes: No results for input(s): "CKTOTAL", "CKMB", "CKMBINDEX", "TROPONINI" in the last 168 hours. BNP (last 3 results) No results for input(s): "PROBNP" in the last 8760 hours. CBG: Recent Labs  Lab 06/24/22 1536 06/24/22 1945 07-13-2022 0005 Jul 13, 2022 0449 2022/07/13 0742  GLUCAP 213* 189* 253* 243* 378*    D-Dimer: No results for input(s): "DDIMER" in the last 72 hours. Hgb A1c: No results for input(s): "HGBA1C" in the last 72 hours. Lipid Profile: No results for input(s): "CHOL", "HDL", "LDLCALC", "TRIG", "CHOLHDL", "LDLDIRECT" in the last 72 hours. Thyroid function studies: No results for input(s): "TSH", "T4TOTAL", "T3FREE", "THYROIDAB" in the last 72 hours.  Invalid input(s): "FREET3" Anemia work up: No results for input(s): "VITAMINB12", "FOLATE", "FERRITIN", "TIBC", "IRON", "RETICCTPCT" in the last 72 hours. Sepsis Labs: Recent Labs  Lab 06/21/22 0319 06/22/22 0330 06/23/22 0338 06/24/22 0445  WBC 4.4 5.5 6.9 6.3    Microbiology Recent Results (from the past 240 hour(s))  Culture, Respiratory w Gram Stain     Status: None (Preliminary result)   Collection Time: 06/21/22 11:54 AM   Specimen: Tracheal Aspirate; Respiratory  Result Value Ref Range Status   Specimen Description TRACHEAL ASPIRATE  Final   Special Requests NONE  Final   Gram Stain   Final    ABUNDANT SQUAMOUS EPITHELIAL CELLS PRESENT ABUNDANT WBC PRESENT, PREDOMINANTLY PMN FEW GRAM POSITIVE COCCI IN PAIRS RARE GRAM NEGATIVE RODS    Culture   Final    MODERATE STREPTOCOCCUS ANGINOSIS CULTURE REINCUBATED FOR BETTER GROWTH Performed at [facility], [place] [address], [place], [place] [address]    Report Status PENDING  Incomplete  Remove and replace urinary cath (placed > 5 days) then obtain urine culture from new indwelling urinary catheter.     Status: None   Collection Time: 06/21/22 12:29 PM   Specimen: Urine, Catheterized  Result Value Ref Range Status   Specimen Description URINE, CATHETERIZED  Final   Special Requests NONE  Final   Culture   Final    NO GROWTH Performed at [facility], [address] [place], [place] [address]    Report Status 06/22/2022 FINAL  Final  Culture, blood (  Routine X 2) w Reflex to ID Panel     Status: None (Preliminary result)   Collection Time: 06/21/22 12:31 PM   Specimen: BLOOD  Result Value Ref Range Status   Specimen Description BLOOD SITE NOT SPECIFIED  Final   Special Requests   Final    BOTTLES DRAWN AEROBIC AND ANAEROBIC Blood Culture adequate volume   Culture   Final    NO GROWTH 4 DAYS Performed at [facility], [address], [place], [place] [address]    Report Status PENDING  Incomplete  Culture, blood (Routine X 2) w Reflex to ID Panel     Status: None (Preliminary result)   Collection Time: 06/21/22 12:34 PM   Specimen: BLOOD RIGHT HAND  Result Value Ref Range Status   Specimen Description BLOOD RIGHT HAND  Final   Special Requests AEROBIC BOTTLE ONLY Blood Culture adequate volume  Final   Culture   Final    NO GROWTH 4 DAYS Performed at [facility], [place] [address], [place], [place] [address]    Report Status PENDING  Incomplete     Medications:    Chlorhexidine Gluconate Cloth  6 each Topical Daily   fiber supplement (BANATROL TF)  60 mL Per Tube BID   free water  400 mL Per Tube Q6H   insulin aspart  0-20 Units Subcutaneous Q4H   insulin aspart  10 Units Subcutaneous Q4H   insulin detemir  25 Units Subcutaneous BID   midodrine  2.5 mg Per Tube BID WC   modafinil  100 mg Per Tube Daily   multivitamin  1 tablet Per Tube QHS   mouth rinse  15 mL Mouth Rinse  Q2H   pantoprazole  40 mg Per Tube Daily   sodium chloride flush  10-40 mL Intracatheter Q12H   Warfarin - Pharmacist Dosing Inpatient   Does not apply q1600   Continuous Infusions:  sodium chloride 5 mL/hr at June 27, 2022 0400   sodium chloride Stopped (06/16/22 2348)   albumin human 999 mL/hr at 06/16/22 1000   anticoagulant sodium citrate     dextrose 75 mL/hr at 2022/06/27 0400   feeding supplement (VITAL AF 1.2 CAL) 65 mL/hr at Jun 27, 2022 0400   lacosamide (VIMPAT) IV Stopped (06/24/22 2134)   levETIRAcetam Stopped (06/24/22 2200)      LOS: 33 days   Charlynne Cousins  [facility]  June 27, 2022, 8:09 AM

## 2022-07-01 DEATH — deceased

## 2022-07-06 ENCOUNTER — Ambulatory Visit: Payer: Medicare Other

## 2022-07-06 ENCOUNTER — Inpatient Hospital Stay: Payer: Medicare Other

## 2022-07-06 ENCOUNTER — Ambulatory Visit: Payer: Medicare Other | Admitting: Hematology & Oncology

## 2022-07-07 LAB — ACID FAST CULTURE WITH REFLEXED SENSITIVITIES (MYCOBACTERIA): Acid Fast Culture: NEGATIVE

## 2022-07-12 ENCOUNTER — Ambulatory Visit: Payer: Medicare Other | Admitting: Cardiology
